# Patient Record
Sex: Female | Born: 1950
Health system: Southern US, Community
[De-identification: ages and names within clinical notes are randomized; demographics above are authoritative.]

## PROBLEM LIST (undated history)

## (undated) DIAGNOSIS — K5730 Diverticulosis of large intestine without perforation or abscess without bleeding: Secondary | ICD-10-CM

## (undated) DIAGNOSIS — K317 Polyp of stomach and duodenum: Secondary | ICD-10-CM

## (undated) DIAGNOSIS — T7840XA Allergy, unspecified, initial encounter: Secondary | ICD-10-CM

## (undated) DIAGNOSIS — K219 Gastro-esophageal reflux disease without esophagitis: Secondary | ICD-10-CM

## (undated) DIAGNOSIS — N200 Calculus of kidney: Secondary | ICD-10-CM

## (undated) DIAGNOSIS — I499 Cardiac arrhythmia, unspecified: Secondary | ICD-10-CM

## (undated) DIAGNOSIS — I4819 Other persistent atrial fibrillation: Secondary | ICD-10-CM

## (undated) DIAGNOSIS — I341 Nonrheumatic mitral (valve) prolapse: Secondary | ICD-10-CM

## (undated) DIAGNOSIS — E042 Nontoxic multinodular goiter: Secondary | ICD-10-CM

## (undated) DIAGNOSIS — K449 Diaphragmatic hernia without obstruction or gangrene: Secondary | ICD-10-CM

## (undated) DIAGNOSIS — G454 Transient global amnesia: Secondary | ICD-10-CM

## (undated) DIAGNOSIS — M1990 Unspecified osteoarthritis, unspecified site: Secondary | ICD-10-CM

## (undated) DIAGNOSIS — F32A Depression, unspecified: Secondary | ICD-10-CM

## (undated) DIAGNOSIS — I10 Essential (primary) hypertension: Secondary | ICD-10-CM

## (undated) DIAGNOSIS — I509 Heart failure, unspecified: Secondary | ICD-10-CM

## (undated) DIAGNOSIS — F419 Anxiety disorder, unspecified: Secondary | ICD-10-CM

## (undated) DIAGNOSIS — E079 Disorder of thyroid, unspecified: Secondary | ICD-10-CM

## (undated) DIAGNOSIS — J45909 Unspecified asthma, uncomplicated: Secondary | ICD-10-CM

## (undated) DIAGNOSIS — J189 Pneumonia, unspecified organism: Secondary | ICD-10-CM

## (undated) DIAGNOSIS — H269 Unspecified cataract: Secondary | ICD-10-CM

## (undated) DIAGNOSIS — A048 Other specified bacterial intestinal infections: Secondary | ICD-10-CM

## (undated) DIAGNOSIS — Z87442 Personal history of urinary calculi: Secondary | ICD-10-CM

## (undated) DIAGNOSIS — M8580 Other specified disorders of bone density and structure, unspecified site: Secondary | ICD-10-CM

## (undated) DIAGNOSIS — U071 COVID-19: Secondary | ICD-10-CM

## (undated) DIAGNOSIS — D126 Benign neoplasm of colon, unspecified: Secondary | ICD-10-CM

## (undated) HISTORY — PX: OTHER SURGICAL HISTORY: SHX169

## (undated) HISTORY — DX: Allergy, unspecified, initial encounter: T78.40XA

## (undated) HISTORY — DX: Essential (primary) hypertension: I10

## (undated) HISTORY — DX: Other specified disorders of bone density and structure, unspecified site: M85.80

## (undated) HISTORY — DX: Transient global amnesia: G45.4

## (undated) HISTORY — DX: Nonrheumatic mitral (valve) prolapse: I34.1

## (undated) HISTORY — DX: Disorder of thyroid, unspecified: E07.9

## (undated) HISTORY — DX: Other persistent atrial fibrillation: I48.19

## (undated) HISTORY — DX: Unspecified osteoarthritis, unspecified site: M19.90

## (undated) HISTORY — DX: Diaphragmatic hernia without obstruction or gangrene: K44.9

## (undated) HISTORY — DX: Calculus of kidney: N20.0

## (undated) HISTORY — PX: CHOLECYSTECTOMY: SHX55

## (undated) HISTORY — DX: Gastro-esophageal reflux disease without esophagitis: K21.9

## (undated) HISTORY — DX: Unspecified cataract: H26.9

## (undated) HISTORY — DX: Unspecified asthma, uncomplicated: J45.909

## (undated) HISTORY — DX: COVID-19: U07.1

## (undated) HISTORY — DX: Benign neoplasm of colon, unspecified: D12.6

## (undated) HISTORY — PX: EYE SURGERY: SHX253

## (undated) HISTORY — DX: Polyp of stomach and duodenum: K31.7

## (undated) HISTORY — DX: Other specified bacterial intestinal infections: A04.8

## (undated) HISTORY — PX: ABDOMINAL HYSTERECTOMY: SHX81

## (undated) HISTORY — DX: Diverticulosis of large intestine without perforation or abscess without bleeding: K57.30

---

## 1997-07-27 ENCOUNTER — Ambulatory Visit (HOSPITAL_COMMUNITY): Admission: RE | Admit: 1997-07-27 | Discharge: 1997-07-27 | Payer: Self-pay | Admitting: Internal Medicine

## 1998-11-15 ENCOUNTER — Other Ambulatory Visit: Admission: RE | Admit: 1998-11-15 | Discharge: 1998-11-15 | Payer: Self-pay | Admitting: Family Medicine

## 2001-01-07 DIAGNOSIS — D126 Benign neoplasm of colon, unspecified: Secondary | ICD-10-CM

## 2001-01-07 HISTORY — DX: Benign neoplasm of colon, unspecified: D12.6

## 2001-06-16 ENCOUNTER — Encounter: Admission: RE | Admit: 2001-06-16 | Discharge: 2001-06-16 | Payer: Self-pay | Admitting: Family Medicine

## 2001-06-16 ENCOUNTER — Encounter: Payer: Self-pay | Admitting: Family Medicine

## 2001-11-13 ENCOUNTER — Encounter: Payer: Self-pay | Admitting: Internal Medicine

## 2001-11-26 ENCOUNTER — Other Ambulatory Visit: Admission: RE | Admit: 2001-11-26 | Discharge: 2001-11-26 | Payer: Self-pay | Admitting: Family Medicine

## 2003-04-16 ENCOUNTER — Emergency Department (HOSPITAL_COMMUNITY): Admission: AD | Admit: 2003-04-16 | Discharge: 2003-04-16 | Payer: Self-pay | Admitting: Emergency Medicine

## 2003-11-16 ENCOUNTER — Ambulatory Visit: Payer: Self-pay | Admitting: Family Medicine

## 2003-11-23 ENCOUNTER — Ambulatory Visit: Payer: Self-pay | Admitting: Family Medicine

## 2003-11-24 ENCOUNTER — Other Ambulatory Visit: Admission: RE | Admit: 2003-11-24 | Discharge: 2003-11-24 | Payer: Self-pay | Admitting: Family Medicine

## 2003-11-28 ENCOUNTER — Ambulatory Visit: Payer: Self-pay | Admitting: Family Medicine

## 2003-12-12 ENCOUNTER — Ambulatory Visit: Payer: Self-pay | Admitting: Family Medicine

## 2004-01-28 ENCOUNTER — Emergency Department (HOSPITAL_COMMUNITY): Admission: EM | Admit: 2004-01-28 | Discharge: 2004-01-28 | Payer: Self-pay | Admitting: Emergency Medicine

## 2004-07-02 ENCOUNTER — Ambulatory Visit: Payer: Self-pay | Admitting: Family Medicine

## 2004-07-05 ENCOUNTER — Ambulatory Visit: Payer: Self-pay | Admitting: Cardiology

## 2004-07-13 ENCOUNTER — Encounter: Admission: RE | Admit: 2004-07-13 | Discharge: 2004-07-13 | Payer: Self-pay | Admitting: Family Medicine

## 2004-07-26 ENCOUNTER — Encounter: Admission: RE | Admit: 2004-07-26 | Discharge: 2004-07-26 | Payer: Self-pay | Admitting: Surgery

## 2004-12-14 ENCOUNTER — Ambulatory Visit: Payer: Self-pay | Admitting: Family Medicine

## 2005-01-28 ENCOUNTER — Ambulatory Visit: Payer: Self-pay | Admitting: Family Medicine

## 2005-02-12 ENCOUNTER — Encounter: Payer: Self-pay | Admitting: Internal Medicine

## 2005-02-18 ENCOUNTER — Ambulatory Visit: Payer: Self-pay | Admitting: Family Medicine

## 2005-04-02 ENCOUNTER — Encounter: Payer: Self-pay | Admitting: Family Medicine

## 2005-04-02 ENCOUNTER — Other Ambulatory Visit: Admission: RE | Admit: 2005-04-02 | Discharge: 2005-04-02 | Payer: Self-pay | Admitting: Family Medicine

## 2005-04-02 ENCOUNTER — Ambulatory Visit: Payer: Self-pay | Admitting: Family Medicine

## 2005-04-19 ENCOUNTER — Ambulatory Visit: Payer: Self-pay | Admitting: Internal Medicine

## 2005-12-04 ENCOUNTER — Encounter: Admission: RE | Admit: 2005-12-04 | Discharge: 2005-12-04 | Payer: Self-pay | Admitting: Family Medicine

## 2006-03-04 ENCOUNTER — Ambulatory Visit: Payer: Self-pay | Admitting: Family Medicine

## 2006-03-05 LAB — CONVERTED CEMR LAB
AST: 18 units/L (ref 0–37)
Albumin: 4.3 g/dL (ref 3.5–5.2)
Alkaline Phosphatase: 54 units/L (ref 39–117)
BUN: 10 mg/dL (ref 6–23)
Basophils Absolute: 0.1 10*3/uL (ref 0.0–0.1)
Basophils Relative: 1.2 % — ABNORMAL HIGH (ref 0.0–1.0)
CO2: 30 meq/L (ref 19–32)
Calcium: 10.3 mg/dL (ref 8.4–10.5)
Chloride: 105 meq/L (ref 96–112)
Cholesterol: 205 mg/dL (ref 0–200)
Creatinine, Ser: 0.7 mg/dL (ref 0.4–1.2)
Direct LDL: 108.9 mg/dL
Eosinophils Absolute: 0.2 10*3/uL (ref 0.0–0.6)
Eosinophils Relative: 2.9 % (ref 0.0–5.0)
Free T4: 0.8 ng/dL (ref 0.6–1.6)
GFR calc Af Amer: 111 mL/min
HCT: 41.1 % (ref 36.0–46.0)
HDL: 45.5 mg/dL (ref >39.0)
Lymphocytes Relative: 32.9 % (ref 12.0–46.0)
MCV: 89.9 fL (ref 78.0–100.0)
Neutro Abs: 4.4 10*3/uL (ref 1.4–7.7)
Platelets: 239 10*3/uL (ref 150–400)
T3, Free: 3.4 pg/mL (ref 2.3–4.2)
TSH: 0.35 microintl units/mL (ref 0.35–5.50)
Total Bilirubin: 1.3 mg/dL — ABNORMAL HIGH (ref 0.3–1.2)
Triglycerides: 301 mg/dL (ref 0–149)
VLDL: 60 mg/dL — ABNORMAL HIGH (ref 0–40)
WBC: 7.6 10*3/uL (ref 4.5–10.5)

## 2006-04-14 ENCOUNTER — Ambulatory Visit: Payer: Self-pay | Admitting: Family Medicine

## 2006-04-14 ENCOUNTER — Other Ambulatory Visit: Admission: RE | Admit: 2006-04-14 | Discharge: 2006-04-14 | Payer: Self-pay | Admitting: Family Medicine

## 2006-04-14 ENCOUNTER — Encounter: Payer: Self-pay | Admitting: Family Medicine

## 2006-05-21 ENCOUNTER — Ambulatory Visit: Payer: Self-pay | Admitting: Internal Medicine

## 2006-12-08 ENCOUNTER — Encounter: Admission: RE | Admit: 2006-12-08 | Discharge: 2006-12-08 | Payer: Self-pay | Admitting: Family Medicine

## 2007-01-19 ENCOUNTER — Ambulatory Visit: Payer: Self-pay | Admitting: Internal Medicine

## 2007-01-21 ENCOUNTER — Ambulatory Visit (HOSPITAL_COMMUNITY): Admission: RE | Admit: 2007-01-21 | Discharge: 2007-01-21 | Payer: Self-pay | Admitting: Internal Medicine

## 2007-01-27 ENCOUNTER — Encounter: Payer: Self-pay | Admitting: Family Medicine

## 2007-06-02 ENCOUNTER — Telehealth: Payer: Self-pay | Admitting: *Deleted

## 2007-06-18 ENCOUNTER — Telehealth: Payer: Self-pay | Admitting: *Deleted

## 2007-07-27 ENCOUNTER — Telehealth: Payer: Self-pay | Admitting: Internal Medicine

## 2007-08-07 ENCOUNTER — Telehealth: Payer: Self-pay | Admitting: Internal Medicine

## 2007-08-10 ENCOUNTER — Ambulatory Visit: Payer: Self-pay | Admitting: Internal Medicine

## 2007-08-10 ENCOUNTER — Telehealth: Payer: Self-pay | Admitting: Family Medicine

## 2007-08-10 DIAGNOSIS — I340 Nonrheumatic mitral (valve) insufficiency: Secondary | ICD-10-CM

## 2007-08-10 DIAGNOSIS — Z860100 Personal history of colon polyps, unspecified: Secondary | ICD-10-CM | POA: Insufficient documentation

## 2007-08-10 DIAGNOSIS — K5730 Diverticulosis of large intestine without perforation or abscess without bleeding: Secondary | ICD-10-CM | POA: Insufficient documentation

## 2007-08-10 DIAGNOSIS — K8020 Calculus of gallbladder without cholecystitis without obstruction: Secondary | ICD-10-CM | POA: Insufficient documentation

## 2007-08-10 DIAGNOSIS — N83209 Unspecified ovarian cyst, unspecified side: Secondary | ICD-10-CM

## 2007-08-10 DIAGNOSIS — Z87442 Personal history of urinary calculi: Secondary | ICD-10-CM

## 2007-08-10 DIAGNOSIS — K449 Diaphragmatic hernia without obstruction or gangrene: Secondary | ICD-10-CM

## 2007-08-10 DIAGNOSIS — R1013 Epigastric pain: Secondary | ICD-10-CM | POA: Insufficient documentation

## 2007-08-10 DIAGNOSIS — Z8711 Personal history of peptic ulcer disease: Secondary | ICD-10-CM

## 2007-08-10 DIAGNOSIS — M1990 Unspecified osteoarthritis, unspecified site: Secondary | ICD-10-CM

## 2007-08-10 DIAGNOSIS — K219 Gastro-esophageal reflux disease without esophagitis: Secondary | ICD-10-CM | POA: Insufficient documentation

## 2007-08-10 DIAGNOSIS — K2940 Chronic atrophic gastritis without bleeding: Secondary | ICD-10-CM | POA: Insufficient documentation

## 2007-08-10 LAB — CONVERTED CEMR LAB
Albumin: 3.9 g/dL (ref 3.5–5.2)
Alkaline Phosphatase: 50 units/L (ref 39–117)
Amylase: 79 units/L (ref 27–131)
Total Bilirubin: 1.2 mg/dL (ref 0.3–1.2)
Total Protein: 7.1 g/dL (ref 6.0–8.3)

## 2007-08-11 ENCOUNTER — Telehealth: Payer: Self-pay | Admitting: Internal Medicine

## 2007-08-12 ENCOUNTER — Ambulatory Visit (HOSPITAL_COMMUNITY): Admission: RE | Admit: 2007-08-12 | Discharge: 2007-08-12 | Payer: Self-pay | Admitting: Internal Medicine

## 2007-08-13 ENCOUNTER — Telehealth: Payer: Self-pay | Admitting: Internal Medicine

## 2007-08-13 ENCOUNTER — Encounter: Payer: Self-pay | Admitting: Internal Medicine

## 2007-08-14 ENCOUNTER — Telehealth: Payer: Self-pay | Admitting: Internal Medicine

## 2007-09-07 ENCOUNTER — Ambulatory Visit: Payer: Self-pay | Admitting: Family Medicine

## 2007-09-07 DIAGNOSIS — N750 Cyst of Bartholin's gland: Secondary | ICD-10-CM

## 2007-09-09 ENCOUNTER — Encounter: Payer: Self-pay | Admitting: Internal Medicine

## 2007-10-26 ENCOUNTER — Ambulatory Visit (HOSPITAL_COMMUNITY): Admission: RE | Admit: 2007-10-26 | Discharge: 2007-10-26 | Payer: Self-pay | Admitting: Surgery

## 2007-10-26 ENCOUNTER — Encounter (INDEPENDENT_AMBULATORY_CARE_PROVIDER_SITE_OTHER): Payer: Self-pay | Admitting: Surgery

## 2008-03-01 ENCOUNTER — Ambulatory Visit: Payer: Self-pay | Admitting: Family Medicine

## 2008-03-01 LAB — CONVERTED CEMR LAB
ALT: 12 units/L (ref 0–35)
AST: 16 units/L (ref 0–37)
Albumin: 4.2 g/dL (ref 3.5–5.2)
Alkaline Phosphatase: 62 units/L (ref 39–117)
Bilirubin Urine: NEGATIVE
Bilirubin, Direct: 0.1 mg/dL (ref 0.0–0.3)
CO2: 30 meq/L (ref 19–32)
Cholesterol: 210 mg/dL (ref 0–200)
Creatinine, Ser: 0.8 mg/dL (ref 0.4–1.2)
Eosinophils Relative: 4 % (ref 0.0–5.0)
GFR calc non Af Amer: 78 mL/min
Glucose, Bld: 95 mg/dL (ref 70–99)
Hemoglobin, Urine: NEGATIVE
Hemoglobin: 13.8 g/dL (ref 12.0–15.0)
Leukocytes, UA: NEGATIVE
Lymphocytes Relative: 31.8 % (ref 12.0–46.0)
MCHC: 34.7 g/dL (ref 30.0–36.0)
MCV: 88.9 fL (ref 78.0–100.0)
Monocytes Absolute: 0.5 10*3/uL (ref 0.1–1.0)
Monocytes Relative: 7.5 % (ref 3.0–12.0)
RDW: 12.3 % (ref 11.5–14.6)
Specific Gravity, Urine: >=1.03 (ref 1.000–1.03)
Total Bilirubin: 1.8 mg/dL — ABNORMAL HIGH (ref 0.3–1.2)
Total Protein, Urine: NEGATIVE mg/dL
Urobilinogen, UA: 0.2 (ref 0.0–1.0)
WBC: 6.3 10*3/uL (ref 4.5–10.5)
pH: 5.5 (ref 5.0–8.0)

## 2008-05-20 ENCOUNTER — Ambulatory Visit: Payer: Self-pay | Admitting: Family Medicine

## 2008-05-20 DIAGNOSIS — J309 Allergic rhinitis, unspecified: Secondary | ICD-10-CM

## 2008-05-20 DIAGNOSIS — G43009 Migraine without aura, not intractable, without status migrainosus: Secondary | ICD-10-CM | POA: Insufficient documentation

## 2008-05-31 ENCOUNTER — Ambulatory Visit (HOSPITAL_BASED_OUTPATIENT_CLINIC_OR_DEPARTMENT_OTHER): Admission: RE | Admit: 2008-05-31 | Discharge: 2008-05-31 | Payer: Self-pay | Admitting: Family Medicine

## 2008-05-31 ENCOUNTER — Ambulatory Visit: Payer: Self-pay | Admitting: Diagnostic Radiology

## 2008-10-28 ENCOUNTER — Encounter: Payer: Self-pay | Admitting: Internal Medicine

## 2009-03-06 ENCOUNTER — Telehealth: Payer: Self-pay | Admitting: Family Medicine

## 2009-03-21 ENCOUNTER — Ambulatory Visit: Payer: Self-pay | Admitting: Family Medicine

## 2009-03-21 DIAGNOSIS — R319 Hematuria, unspecified: Secondary | ICD-10-CM

## 2009-03-21 DIAGNOSIS — N390 Urinary tract infection, site not specified: Secondary | ICD-10-CM

## 2009-03-21 LAB — CONVERTED CEMR LAB
Ketones, urine, test strip: NEGATIVE
Nitrite: NEGATIVE
Protein, U semiquant: 30
Urobilinogen, UA: 0.2

## 2009-06-14 ENCOUNTER — Ambulatory Visit (HOSPITAL_BASED_OUTPATIENT_CLINIC_OR_DEPARTMENT_OTHER): Admission: RE | Admit: 2009-06-14 | Discharge: 2009-06-14 | Payer: Self-pay | Admitting: Family Medicine

## 2009-06-14 ENCOUNTER — Ambulatory Visit: Payer: Self-pay | Admitting: Diagnostic Radiology

## 2009-07-12 ENCOUNTER — Telehealth: Payer: Self-pay | Admitting: Family Medicine

## 2009-08-14 ENCOUNTER — Telehealth: Payer: Self-pay | Admitting: Family Medicine

## 2009-08-22 ENCOUNTER — Telehealth: Payer: Self-pay | Admitting: Family Medicine

## 2009-09-28 ENCOUNTER — Telehealth: Payer: Self-pay | Admitting: Family Medicine

## 2009-10-16 ENCOUNTER — Ambulatory Visit: Payer: Self-pay | Admitting: Family Medicine

## 2009-10-16 LAB — CONVERTED CEMR LAB
ALT: 14 units/L (ref 0–35)
AST: 18 units/L (ref 0–37)
Albumin: 4.2 g/dL (ref 3.5–5.2)
Basophils Relative: 0.8 % (ref 0.0–3.0)
Calcium: 10.1 mg/dL (ref 8.4–10.5)
Chloride: 105 meq/L (ref 96–112)
Creatinine, Ser: 0.8 mg/dL (ref 0.4–1.2)
Direct LDL: 106.8 mg/dL
Eosinophils Relative: 3.6 % (ref 0.0–5.0)
GFR calc non Af Amer: 78.72 mL/min (ref >60)
Ketones, ur: NEGATIVE mg/dL
Lymphocytes Relative: 38.1 % (ref 12.0–46.0)
Lymphs Abs: 2 10*3/uL (ref 0.7–4.0)
MCV: 90.6 fL (ref 78.0–100.0)
Monocytes Absolute: 0.4 10*3/uL (ref 0.1–1.0)
Neutrophils Relative %: 50.2 % (ref 43.0–77.0)
RBC: 4.44 M/uL (ref 3.87–5.11)
TSH: 0.44 microintl units/mL (ref 0.35–5.50)
Total Bilirubin: 1.5 mg/dL — ABNORMAL HIGH (ref 0.3–1.2)
Total Protein, Urine: NEGATIVE mg/dL
Total Protein: 7.5 g/dL (ref 6.0–8.3)
Triglycerides: 272 mg/dL — ABNORMAL HIGH (ref 0.0–149.0)
pH: 5 (ref 5.0–8.0)

## 2009-10-27 ENCOUNTER — Ambulatory Visit: Payer: Self-pay | Admitting: Family Medicine

## 2009-10-27 ENCOUNTER — Other Ambulatory Visit: Admission: RE | Admit: 2009-10-27 | Discharge: 2009-10-27 | Payer: Self-pay | Admitting: Family Medicine

## 2009-10-27 ENCOUNTER — Encounter: Payer: Self-pay | Admitting: Family Medicine

## 2009-11-10 ENCOUNTER — Ambulatory Visit: Payer: Self-pay | Admitting: Family Medicine

## 2009-11-13 ENCOUNTER — Telehealth: Payer: Self-pay | Admitting: Family Medicine

## 2009-11-14 DIAGNOSIS — D235 Other benign neoplasm of skin of trunk: Secondary | ICD-10-CM | POA: Insufficient documentation

## 2009-12-26 ENCOUNTER — Ambulatory Visit: Payer: Self-pay | Admitting: Family Medicine

## 2010-01-28 ENCOUNTER — Encounter: Payer: Self-pay | Admitting: Family Medicine

## 2010-02-02 ENCOUNTER — Telehealth: Payer: Self-pay | Admitting: Family Medicine

## 2010-02-06 NOTE — Progress Notes (Signed)
Summary: rx clarification  Phone Note From Pharmacy   Summary of Call: rx clarification Initial call taken by: Kern Reap CMA Duncan Dull),  November 13, 2009 5:22 PM    New/Updated Medications: PREMARIN 0.625 MG/GM CREA (ESTROGENS, CONJUGATED) 0.5g twice a week vaginally Prescriptions: PREMARIN 0.625 MG/GM CREA (ESTROGENS, CONJUGATED) 0.5g twice a week vaginally  #2 x 3   Entered by:   Kern Reap CMA (AAMA)   Authorized by:   Roderick Pee MD   Signed by:   Kern Reap CMA (AAMA) on 11/13/2009   Method used:   Electronically to        [facility] [phone]* (retail)       [address]       [place], [place]  [address]       Ph: [phone]       Fax: [phone]   RxID:   [id number]

## 2010-02-06 NOTE — Progress Notes (Signed)
Summary: hctz  Phone Note Refill Request Message from:  Fax from Pharmacy on August 14, 2009 5:09 PM  Refills Requested: Medication #1:  CORGARD 20 MG  TABS 1 tablet po once daily Initial call taken by: Kern Reap CMA Duncan Dull),  August 14, 2009 5:09 PM    Prescriptions: CORGARD 20 MG  TABS (NADOLOL) 1 tablet po once daily  #100 Tablet x 2   Entered by:   Kern Reap CMA (AAMA)   Authorized by:   Roderick Pee MD   Signed by:   Kern Reap CMA (AAMA) on 08/14/2009   Method used:   Electronically to        [facility] [phone]* (retail)       [address]       [place], [place]  [address]       Ph: [phone]       Fax: [phone]   RxID:   [id number]

## 2010-02-06 NOTE — Progress Notes (Signed)
Summary: cipro refill  Phone Note From Pharmacy   Summary of Call: patient is requesting a rx for cipro is this okay to fill? Initial call taken by: Kern Reap CMA Duncan Dull),  August 22, 2009 4:49 PM  Follow-up for Phone Call        Cipro 250 mg, dispense 14 tablets directions one p.o. b.i.d. to a bottle empty.  No refills Follow-up by: Roderick Pee MD,  August 22, 2009 4:58 PM    Prescriptions: CIPRO 500 MG TABS (CIPROFLOXACIN HCL) Take 1 tablet by mouth two times a day  #14 x 0   Entered by:   Kern Reap CMA (AAMA)   Authorized by:   Roderick Pee MD   Signed by:   Kern Reap CMA (AAMA) on 08/22/2009   Method used:   Electronically to        [facility] [phone]* (retail)       [address]       [place], [place]  [address]       Ph: [phone]       Fax: [phone]   RxID:   [id number]

## 2010-02-06 NOTE — Progress Notes (Signed)
Summary: vicodin refill  Phone Note Refill Request Message from:  Fax from Pharmacy on March 06, 2009 1:51 PM  Refills Requested: Medication #1:  VICODIN 5-500 MG  TABS Take 1 tablet every 6 hours as needed Initial call taken by: Kern Reap CMA Duncan Dull),  March 06, 2009 1:51 PM    Prescriptions: VICODIN 5-500 MG  TABS (HYDROCODONE-ACETAMINOPHEN) Take 1 tablet every 6 hours as needed  #30 x 1   Entered by:   Kern Reap CMA (AAMA)   Authorized by:   Roderick Pee MD   Signed by:   Kern Reap CMA (AAMA) on 03/06/2009   Method used:   Telephoned to ...       [facility] [phone]* (retail)       [address]       [place], [place]  [address]       Ph: [phone]       Fax: [phone]   RxID:   [id number]

## 2010-02-06 NOTE — Assessment & Plan Note (Signed)
Summary: LESION REMOVAL // RS   Procedure Note Last Tetanus: Historical (01/08/2007)  Mole Biopsy/Removal: Indication: rule out cancer Consent signed: yes  Procedure # 1: elliptical incision with 2 mm margin    Size (in cm): 0.8 x 0.8    Region: anterior    Location: left bst    Instrument used: #15 blade    Anesthesia: 1% lidocaine w/epinephrine    Closure: cautery  Cleaned and prepped with: alcohol Wound dressing: neosporin and bandaid   Primary Care Provider:  Kelle Darting, M.D.   History of Present Illness: Anna Marquez is a 60-year-old female, who comes in today for removal of a black lesion on the margin of her left breast at the clock  Allergies: 1)  ! Pcn 2)  ! Erythromycin   Complete Medication List: 1)  Hydrochlorothiazide 25 Mg Tabs (Hydrochlorothiazide) .[name].. 1 tablet by mouth once daily 2)  Nexium 40 Mg Cpdr (Esomeprazole magnesium) .... Take 1 tablet by mouth once daily 3)  Beconase Aq 42 Mcg/spray Susp (Beclomethasone diprop monohyd) .... Use as directed 4)  Vicodin 5-500 Mg Tabs (Hydrocodone-acetaminophen) .... Take 1 tablet every 6 hours as needed 5)  Imodium A-d 2 Mg Tabs (Loperamide hcl) .... Take one tab once daily as needed 6)  Imitrex 5 Mg/act Soln (Sumatriptan) .... As needed for migraine 7)  Fish Oil Oil (Fish oil) .... Once daily 8)  Vitamin D 2000 Unit Tabs (Cholecalciferol) .... Once daily 9)  Vitamin E 1000 Unit Caps (Vitamin e) .... Once daily 10)  Corgard 40 Mg Tabs (Nadolol) .... Take 1 tablet by mouth every morning 11)  Premarin 0.625 Mg/gm Crea (Estrogens, conjugated) .... Uad  Other Orders: Shave Skin Lesion 0.6-1.0 cm/trunk/arm/leg (11301)   Orders Added: 1)  Shave Skin Lesion 0.6-1.0 cm/trunk/arm/leg [11301]

## 2010-02-06 NOTE — Progress Notes (Signed)
Summary: refill  Phone Note Refill Request Message from:  Fax from Pharmacy on September 28, 2009 3:43 PM  Refills Requested: Medication #1:  BECONASE AQ 42 MCG/SPRAY  SUSP use as directed Initial call taken by: Kern Reap CMA Duncan Dull),  September 28, 2009 3:43 PM    Prescriptions: BECONASE AQ 42 MCG/SPRAY  SUSP (BECLOMETHASONE DIPROP MONOHYD) use as directed  #25 Gram x 4   Entered by:   Kern Reap CMA (AAMA)   Authorized by:   Roderick Pee MD   Signed by:   Kern Reap CMA (AAMA) on 09/28/2009   Method used:   Electronically to        [facility] [phone]* (retail)       [address]       [place], [place]  [address]       Ph: [phone]       Fax: [phone]   RxID:   [id number]

## 2010-02-06 NOTE — Miscellaneous (Signed)
Summary: Consent to Special Procedure  Consent to Special Procedure   Imported By: Lanelle Bal 11/17/2009 08:51:01  _____________________________________________________________________  External Attachment:    Type:   Image     Comment:   External Document

## 2010-02-06 NOTE — Assessment & Plan Note (Signed)
Summary: UTI/dm   Vital Signs:  Patient profile:   60 year old female Menstrual status:  postmenopausal Weight:      160 pounds Temp:     98.8 degrees F oral BP sitting:   124 / 84  (left arm) Cuff size:   regular  Vitals Entered By: Kern Reap CMA Duncan Dull) (March 21, 2009 2:29 PM)  Reason for Visit uti  Primary Care Provider:  Kelle Darting, M.D.   History of Present Illness: Anna Marquez is a 50-year-old female, single, nonsmoker, who comes in with a 4-day history of urinary frequency and dysuria.  No fever, chills,   Allergies: 1)  ! Pcn 2)  ! Erythromycin  Past History:  Past medical, surgical, family and social histories (including risk factors) reviewed for relevance to current acute and chronic problems.  Past Medical History: Reviewed history from 08/10/2007 and no changes required. Current Problems:  ABDOMINAL PAIN, EPIGASTRIC (ICD-789.06) DIVERTICULOSIS, COLON (ICD-562.10) Hx of OVARIAN CYST (ICD-620.2) GERD (ICD-530.81) NEPHROLITHIASIS, HX OF (ICD-V13.01) OSTEOARTHRITIS (ICD-715.90) MITRAL VALVE PROLAPSE (ICD-424.0) COLONIC POLYPS, ADENOMATOUS, HX OF (ICD-V12.72) GASTRITIS, CHRONIC (ICD-535.10) HIATAL HERNIA (ICD-553.3) CHOLELITHIASIS (ICD-574.20) HELICOBACTER PYLORI INFECTION, HX OF (ICD-V12.71)  Past Surgical History: Reviewed history from 10/28/2008 and no changes required. Kidney stone removal x 2 Cholecystectomy  Family History: Reviewed history from 08/10/2007 and no changes required. Family History of Breast Cancer: Paternal Aunt No FH of Colon Cancer Family History of Prostate Cancer: Father Family History of Diabetes: Maternal Grandmother Family History of Heart Disease: Maternal Grandmother, Maternal Grandfather  Social History: Reviewed history from 08/10/2007 and no changes required. Alcohol Use - yes Illicit Drug Use - no Patient has never smoked.  Daily Caffeine Use  Review of Systems      See HPI  Physical Exam  General:   Well-developed,well-nourished,in no acute distress; alert,appropriate and cooperative throughout examination Abdomen:  Bowel sounds positive,abdomen soft and non-tender without masses, organomegaly or hernias noted.   Problems:  Medical Problems Added: 1)  Dx of Uti  (ICD-599.0) 2)  Dx of Hematuria Unspecified  (ICD-599.70)  Impression & Recommendations:  Problem # 1:  UTI (ICD-599.0) Assessment New  Her updated medication list for this problem includes:    Cipro 500 Mg Tabs (Ciprofloxacin hcl) .[name]... Take 1 tablet by mouth two times a day  Complete Medication List: 1)  Carafate 1 Gm/1ml Susp (Sucralfate) .[name].. 1 teaspoon by mouth as needed 2)  Hydrochlorothiazide 25 Mg Tabs (Hydrochlorothiazide) .[name].. 1 tablet by mouth once daily 3)  Corgard 20 Mg Tabs (Nadolol) .[name].. 1 tablet po once daily 4)  Nexium 40 Mg Cpdr (Esomeprazole magnesium) .... Take 1 tablet by mouth once daily 5)  Hyoscyamine Sulfate Cr 0.375 Mg Xr12h-cap (Hyoscyamine sulfate) .[name].. 1 capsule by mouth every morning and 1 capsule every night 6)  Beconase Aq 42 Mcg/spray Susp (Beclomethasone diprop monohyd) .... Use as directed 7)  Vicodin 5-500 Mg Tabs (Hydrocodone-acetaminophen) .... Take 1 tablet every 6 hours as needed 8)  Imodium A-d 2 Mg Tabs (Loperamide hcl) .... Take one tab once daily as needed 9)  Imitrex 5 Mg/act Soln (Sumatriptan) .... As needed for migraine 10)  Fish Oil Oil (Fish oil) .... Once daily 11)  Vitamin D 2000 Unit Tabs (Cholecalciferol) .... Once daily 12)  Vitamin E 1000 Unit Caps (Vitamin e) .... Once daily 13)  Cipro 500 Mg Tabs (Ciprofloxacin hcl) .... Take 1 tablet by mouth two times a day  Other Orders: UA Dipstick w/o Micro (manual) (19147)  Patient Instructions: 1)  begin Cipro, one twice a day, x 1 week.  Return p.r.n.[name] 2)  You may also get over-the-counter Pyridium 3 times a day  p.r.n.  +.....                                       Prescriptions: CIPRO 500 MG TABS (CIPROFLOXACIN HCL) Take 1 tablet by mouth two times a day  #14 x 1   Entered and Authorized by:   Roderick Pee MD   Signed by:   Roderick Pee MD on 03/21/2009   Method used:   Electronically to        [facility] [phone]* (retail)       [address]       [place], [place]  [address]       Ph: [phone]       Fax: [phone]   RxID:   [id number]   Laboratory Results   Urine Tests  Date/Time Received: March 21, 2009  Date/Time Reported: 2:46 PM   Routine Urinalysis   Color: yellow Appearance: Cloudy Glucose: negative   (Normal Range: Negative) Bilirubin: negative   (Normal Range: Negative) Ketone: negative   (Normal Range: Negative) Spec. Gravity: 1.010   (Normal Range: 1.003-1.035) Blood: large   (Normal Range: Negative) pH: 5.0   (Normal Range: 5.0-8.0) Protein: 30   (Normal Range: Negative) Urobilinogen: 0.2   (Normal Range: 0-1) Nitrite: negative   (Normal Range: Negative) Leukocyte Esterace: moderate   (Normal Range: Negative)    Comments: Kern Reap CMA Duncan Dull)  March 21, 2009 2:46 PM

## 2010-02-06 NOTE — Progress Notes (Signed)
Summary: hctz refill  Phone Note Refill Request Message from:  Fax from Pharmacy on July 12, 2009 12:24 PM  Refills Requested: Medication #1:  HYDROCHLOROTHIAZIDE 25 MG TABS 1 tablet by mouth once daily Initial call taken by: Kern Reap CMA Duncan Dull),  July 12, 2009 12:24 PM    Prescriptions: HYDROCHLOROTHIAZIDE 25 MG TABS (HYDROCHLOROTHIAZIDE) 1 tablet by mouth once daily  #100 Tablet x 3   Entered by:   Kern Reap CMA (AAMA)   Authorized by:   Roderick Pee MD   Signed by:   Kern Reap CMA (AAMA) on 07/12/2009   Method used:   Electronically to        [facility] [phone]* (retail)       [address]       [place], [place]  [address]       Ph: [phone]       Fax: [phone]   RxID:   [id number]

## 2010-02-06 NOTE — Assessment & Plan Note (Signed)
Summary: cpx---pap//ccm   Vital Signs:  Patient profile:   60 year old female Menstrual status:  postmenopausal Height:      64.5 inches Weight:      170 pounds BMI:     28.83 Temp:     98.5 degrees F oral BP sitting:   130 / 90  (left arm) Cuff size:   regular  Vitals Entered By: Kern Reap CMA Duncan Dull) (October 27, 2009 2:29 PM) CC: cpx Is Patient Diabetic? No Pain Assessment Patient in pain? no        Primary Care Provider:  Kelle Darting, M.D.  CC:  cpx.  History of Present Illness: Anna Marquez is a 60 year-old single female, nonsmoker, who comes in today for physical evaluation because of underlying migraine headaches, allergic rhinitis, reflux esophagitis, history of MVP osteoarthritis.  Her migraine headaches went away and now the back.  If she takes the Imitrex immediately then they typically will stop.  She occasionally has to take a half of Vicodin.  She now is having two to 3 migraines per month.  We reviewed the triggers.  Stress is normal.  No caffeine.  No sleep dysfunction.she takes Corgard 20 mg daily.  We discussed other options.  She likes to increase the Corgard first.  She uses steroid nasal spray p.r.n. for allergic rhinitis.  She has chronic reflux esophagitis.  She is on Nexium 40 mg daily, however, she is now using an OTC product.  She states she went to see Dr. Dickie La, but waited for 3 hours and then left.  There will not renew her medication.  She's tried over-the-counter medications that did not help.  She gets routine eye care, dental care, BSE monthly, annual mammography, she thinks she maybe due for colonoscopy by Dr. Dickie La.  The last one she had was 5 years ago, and she had some polyps.  Tetanus 2009, seasonal flu shot 2011  She takes  thiazide 25 mg q.a.m. for venous insufficiency  Allergies: 1)  ! Pcn 2)  ! Erythromycin  Past History:  Past medical, surgical, family and social histories (including risk factors) reviewed, and no changes noted  (except as noted below).  Past Medical History: Reviewed history from 08/10/2007 and no changes required. Current Problems:  ABDOMINAL PAIN, EPIGASTRIC (ICD-789.06) DIVERTICULOSIS, COLON (ICD-562.10) Hx of OVARIAN CYST (ICD-620.2) GERD (ICD-530.81) NEPHROLITHIASIS, HX OF (ICD-V13.01) OSTEOARTHRITIS (ICD-715.90) MITRAL VALVE PROLAPSE (ICD-424.0) COLONIC POLYPS, ADENOMATOUS, HX OF (ICD-V12.72) GASTRITIS, CHRONIC (ICD-535.10) HIATAL HERNIA (ICD-553.3) CHOLELITHIASIS (ICD-574.20) HELICOBACTER PYLORI INFECTION, HX OF (ICD-V12.71)  Past Surgical History: Reviewed history from 10/28/2008 and no changes required. Kidney stone removal x 2 Cholecystectomy  Family History: Reviewed history from 08/10/2007 and no changes required. Family History of Breast Cancer: Paternal Aunt No FH of Colon Cancer Family History of Prostate Cancer: Father Family History of Diabetes: Maternal Grandmother Family History of Heart Disease: Maternal Grandmother, Maternal Grandfather  Social History: Reviewed history from 08/10/2007 and no changes required. Alcohol Use - yes Illicit Drug Use - no Patient has never smoked.  Daily Caffeine Use  Review of Systems      See HPI  Physical Exam  General:  Well-developed,well-nourished,in no acute distress; alert,appropriate and cooperative throughout examination Head:  Normocephalic and atraumatic without obvious abnormalities. No apparent alopecia or balding. Eyes:  No corneal or conjunctival inflammation noted. EOMI. Perrla. Funduscopic exam benign, without hemorrhages, exudates or papilledema. Vision grossly normal. Ears:  External ear exam shows no significant lesions or deformities.  Otoscopic examination reveals clear canals, tympanic membranes  are intact bilaterally without bulging, retraction, inflammation or discharge. Hearing is grossly normal bilaterally. Nose:  External nasal examination shows no deformity or inflammation. Nasal mucosa are pink and  moist without lesions or exudates. Mouth:  Oral mucosa and oropharynx without lesions or exudates.  Teeth in good repair. Neck:  No deformities, masses, or tenderness noted. Chest Wall:  No deformities, masses, or tenderness noted. Breasts:  No mass, nodules, thickening, tenderness, bulging, retraction, inflamation, nipple discharge or skin changes noted.   Lungs:  Normal respiratory effort, chest expands symmetrically. Lungs are clear to auscultation, no crackles or wheezes. Heart:  Normal rate and regular rhythm. S1 and S2 normal without gallop, murmur, click, rub or other extra sounds. Abdomen:  Bowel sounds positive,abdomen soft and non-tender without masses, organomegaly or hernias noted. Rectal:  No external abnormalities noted. Normal sphincter tone. No rectal masses or tenderness. Genitalia:  Pelvic Exam:        External: normal female genitalia without lesions or masses        Vagina: normal without lesions or masses        Cervix: normal without lesions or masses        Adnexa: normal bimanual exam without masses or fullness        Uterus: normal by palpation        Pap smear: performed Msk:  No deformity or scoliosis noted of thoracic or lumbar spine.   Pulses:  R and L carotid,radial,femoral,dorsalis pedis and posterior tibial pulses are full and equal bilaterally Extremities:  No clubbing, cyanosis, edema, or deformity noted with normal full range of motion of all joints.   Neurologic:  No cranial nerve deficits noted. Station and gait are normal. Plantar reflexes are down-going bilaterally. DTRs are symmetrical throughout. Sensory, motor and coordinative functions appear intact. Skin:  total body skin exam normal except for black lesion margin of her left breast to return for removal Cervical Nodes:  No lymphadenopathy noted Axillary Nodes:  No palpable lymphadenopathy Inguinal Nodes:  No significant adenopathy Psych:  Cognition and judgment appear intact. Alert and cooperative  with normal attention span and concentration. No apparent delusions, illusions, hallucinations   Impression & Recommendations:  Problem # 1:  ALLERGIC RHINITIS (ICD-477.9) Assessment Improved  Her updated medication list for this problem includes:    Beconase Aq 42 Mcg/spray Susp (Beclomethasone diprop monohyd) ..... Use as directed  Orders: Prescription Created Electronically [phone])  Problem # 2:  COMMON MIGRAINE (ICD-346.10) Assessment: Deteriorated  The following medications were removed from the medication list:    Corgard 20 Mg Tabs (Nadolol) .[name]... 1 tablet po once daily Her updated medication list for this problem includes:    Vicodin 5-500 Mg Tabs (Hydrocodone-acetaminophen) .[name]... Take 1 tablet every 6 hours as needed    Imitrex 5 Mg/act Soln (Sumatriptan) .[name]... As needed for migraine    Corgard 40 Mg Tabs (Nadolol) .[name]... Take 1 tablet by mouth every morning  Orders: Prescription Created Electronically [phone])  Problem # 3:  GERD (ICD-530.81) Assessment: Deteriorated  The following medications were removed from the medication list:    Carafate 1 Gm/11ml Susp (Sucralfate) .[name]... 1 teaspoon by mouth as needed    Hyoscyamine Sulfate Cr 0.375 Mg Xr12h-cap (Hyoscyamine sulfate) .[name]... 1 capsule by mouth every morning and 1 capsule every night Her updated medication list for this problem includes:    Nexium 40 Mg Cpdr (Esomeprazole magnesium) .[name]... Take 1 tablet by mouth once daily  Orders: Prescription Created Electronically [phone])  Problem # 4:  MITRAL VALVE PROLAPSE (ICD-424.0) Assessment: Unchanged  The following medications were removed from the medication list:    Corgard 20 Mg Tabs (Nadolol) .[name]... 1 tablet po once daily Her updated medication list for this problem includes:    Corgard 40 Mg Tabs (Nadolol) .[name]... Take 1 tablet by mouth every morning  Orders: Prescription Created Electronically [id number])  Problem # 5:  Preventive Health Care  (ICD-V70.0) Assessment: Unchanged  Complete Medication List: 1)  Hydrochlorothiazide 25 Mg Tabs (Hydrochlorothiazide) .[name].. 1 tablet by mouth once daily 2)  Nexium 40 Mg Cpdr (Esomeprazole magnesium) .... Take 1 tablet by mouth once daily 3)  Beconase Aq 42 Mcg/spray Susp (Beclomethasone diprop monohyd) .... Use as directed 4)  Vicodin 5-500 Mg Tabs (Hydrocodone-acetaminophen) .... Take 1 tablet every 6 hours as needed 5)  Imodium A-d 2 Mg Tabs (Loperamide hcl) .... Take one tab once daily as needed 6)  Imitrex 5 Mg/act Soln (Sumatriptan) .... As needed for migraine 7)  Fish Oil Oil (Fish oil) .... Once daily 8)  Vitamin D 2000 Unit Tabs (Cholecalciferol) .... Once daily 9)  Vitamin E 1000 Unit Caps (Vitamin e) .... Once daily 10)  Corgard 40 Mg Tabs (Nadolol) .... Take 1 tablet by mouth every morning 11)  Premarin 0.625 Mg/gm Crea (Estrogens, conjugated) .... Uad  Other Orders: EKG w/ Interpretation (93000)  Patient Instructions: 1)  increase the Corgard to 40 mg daily.  Take the Imitrex immediately if you have a migraine headache.  A half of a Vicodin p.r.n. for breakthrough migraines.  Keep her migraine diary. 2)  Return next week to remove the black lesion on the left breast. 3)  Please schedule a follow-up appointment in 1 year. 4)  It is important that you exercise regularly at least 20 minutes 5 times a week. If you develop chest pain, have severe difficulty breathing, or feel very tired , stop exercising immediately and seek medical attention. 5)  Schedule your mammogram. 6)  Schedule a colonoscopy/sigmoidoscopy to help detect colon cancer. 7)  Take calcium +Vitamin D daily. 8)  Take an Aspirin every day. Prescriptions: PREMARIN 0.625 MG/GM CREA (ESTROGENS, CONJUGATED) UAD  #2 tubes x 4   Entered and Authorized by:   Roderick Pee MD   Signed by:   Roderick Pee MD on 10/27/2009   Method used:   Print then Give to Patient   RxID:   [id number] VICODIN 5-500 MG  TABS  (HYDROCODONE-ACETAMINOPHEN) Take 1 tablet every 6 hours as needed  #30 x 1   Entered and Authorized by:   Roderick Pee MD   Signed by:   Roderick Pee MD on 10/27/2009   Method used:   Print then Give to Patient   RxID:   [id number] HWEXHBZ 5 MG/ACT SOLN (SUMATRIPTAN) as needed for migraine  #6 x 11   Entered and Authorized by:   Roderick Pee MD   Signed by:   Roderick Pee MD on 10/27/2009   Method used:   Electronically to        [facility] [phone]* (retail)       [address]       [place], [place]  [address]       Ph: [phone]       Fax: [phone]   RxID:   [id number] BECONASE AQ 42 MCG/SPRAY  SUSP (BECLOMETHASONE DIPROP MONOHYD) use as directed  #2 units x 4   Entered  and Authorized by:   Roderick Pee MD   Signed by:   Roderick Pee MD on 10/27/2009   Method used:   Electronically to        [facility] [phone]* (retail)       [address]       [place], [place]  [address]       Ph: [phone]       Fax: [phone]   RxID:   [id number] NEXIUM 40 MG  CPDR (ESOMEPRAZOLE MAGNESIUM) Take 1 tablet by mouth once daily  #100 x 3   Entered and Authorized by:   Roderick Pee MD   Signed by:   Roderick Pee MD on 10/27/2009   Method used:   Electronically to        [facility] [phone]* (retail)       [address]       [place], [place]  [address]       Ph: [phone]       Fax: [phone]   RxID:   [id number] CORGARD 40 MG TABS (NADOLOL) Take 1 tablet by mouth every morning  #100 x 3   Entered and Authorized by:   Roderick Pee MD   Signed by:   Roderick Pee MD on 10/27/2009   Method used:   Electronically to        [facility] [phone]* (retail)       [address]       [place], [place]  [address]       Ph: [phone]       Fax: [phone]   RxID:   [id number] HYDROCHLOROTHIAZIDE 25 MG TABS  (HYDROCHLOROTHIAZIDE) 1 tablet by mouth once daily  #100 Tablet x 3   Entered and Authorized by:   Roderick Pee MD   Signed by:   Roderick Pee MD on 10/27/2009   Method used:   Electronically to        [facility] [phone]* (retail)       [address]       [place], [place]  [address]       Ph: [phone]       Fax: [phone]   RxID:   [id number]    Orders Added: 1)  Prescription Created Electronically [G8553] 2)  Est. Patient 40-64 years [423[address]] 3)  EKG w/ Interpretation [93000]   Immunization History:  Tetanus/Td Immunization History:    Tetanus/Td:  historical (01/08/2007)  Influenza Immunization History:    Influenza:  historical (10/07/2009)   Immunization History:  Tetanus/Td Immunization History:    Tetanus/Td:  Historical (01/08/2007)  Influenza Immunization History:    Influenza:  Historical (10/07/2009)

## 2010-02-08 NOTE — Progress Notes (Addendum)
Summary: rx for nerves  Phone Note Call from Patient Call back at Home Phone [phone]   Caller: Patient Call For: todd Summary of Call: pt would like a rx for her nerves, she is not sleeping and she has family issues.  Dr Tawanna Cooler has given her diazepam in the past and it has worked.  Call into [facility] Initial call taken by: Alfred Levins, CMA,  February 02, 2010 4:51 PM  Follow-up for Phone Call        I called in alium  5 mg, number 30 directions one nightly p.r.n. no refills Follow-up by: Roderick Pee MD,  February 02, 2010 6:32 PM     Appended Document: rx for nerves Medications Added ATIVAN 0.5 MG TABS (LORAZEPAM) 1 tab at bedtime      left message on machine for patient     Clinical Lists Changes  Medications: Added new medication of ATIVAN 0.5 MG TABS (LORAZEPAM) 1 tab at bedtime - Signed Rx of ATIVAN 0.5 MG TABS (LORAZEPAM) 1 tab at bedtime;  #30 x 0;  Signed;  Entered by: Kern Reap CMA (AAMA);  Authorized by: Roderick Pee MD;  Method used: Telephoned to [facility] #[id number]*, [address] [place], [place], [place]  [address], Ph: [phone], Fax: [phone]    Prescriptions: ATIVAN 0.5 MG TABS (LORAZEPAM) 1 tab at bedtime  #30 x 0   Entered by:   Kern Reap CMA (AAMA)   Authorized by:   Roderick Pee MD   Signed by:   Kern Reap CMA (AAMA) on 02/05/2010   Method used:   Telephoned to ...       [facility]  [address] [phone]* (retail)       [address]       [place], [place]  [address]       Ph: [phone]       Fax: [phone]   RxID:   [id number]

## 2010-02-15 ENCOUNTER — Telehealth: Payer: Self-pay | Admitting: Internal Medicine

## 2010-02-21 ENCOUNTER — Encounter: Payer: Self-pay | Admitting: Internal Medicine

## 2010-02-21 ENCOUNTER — Ambulatory Visit (INDEPENDENT_AMBULATORY_CARE_PROVIDER_SITE_OTHER): Payer: Managed Care, Other (non HMO) | Admitting: Internal Medicine

## 2010-02-21 DIAGNOSIS — R1013 Epigastric pain: Secondary | ICD-10-CM

## 2010-02-22 NOTE — Progress Notes (Signed)
Summary: Triage   Phone Note Call from Patient Call back at Work Phone [phone]   Caller: Patient Call For: Dr. Juanda Chance Reason for Call: Talk to Nurse Summary of Call: Acid reflux, abd pain, bloating...wants to discuss Initial call taken by: Karna Christmas,  February 15, 2010 9:43 AM  Follow-up for Phone Call        Message left for patient to call back. Follow-up by: Jesse Fall RN,  February 15, 2010 11:36 AM  Additional Follow-up for Phone Call Additional follow up Details #1::        Called patient. She states she sent for her annual exam with PCP and he wanted her to call. She is having heartburn, acid reflux and bloating for a while now. She is taking Nexium daily. Also, she started having diarrhea off and on a couple weeks ago after being around her boss that had a stomach virus. She is using Imodium and this is helping her. Patient will increase her Nexium to two times a day. Scheduled patient to see Dr. Juanda Chance on 02/21/10 at 2:30 PM. Last colon: 11/13/2001-Adenomatous polyp. Please, advise. Additional Follow-up by: Jesse Fall RN,  February 15, 2010 4:00 PM    Additional Follow-up for Phone Call Additional follow up Details #2::    nothing more to add. I will see her on 02/21/2010 Follow-up by: Hart Carwin MD,  February 15, 2010 9:49 PM

## 2010-02-28 NOTE — Assessment & Plan Note (Addendum)
Summary: acid reflux , bloating and and heartburn    History of Present Illness Visit Type: Follow-up Visit Primary GI MD: Lina Sar MD Primary Provider: Kelle Darting, M.D. Chief Complaint: acid reflux, bloating and heartburn x 3 months   is better since increasing her Nexium to bid  History of Present Illness:   This is a 60 year old, white female with epigastric pain and a history of gastroesophageal reflux and bloating. We have increased her Nexium to 40 mg twice a day which resulted in no significant improvement of her symptoms. She still has burning and epigastric discomfort, usually postprandially. She has a history of symptomatic cholelithiasis and is status post laparoscopic cholecystectomy in September 2009. Her last upper endoscopy at [facility] in 2007 showed H. pylori which was treated. She also had a hiatal hernia. Her last colonoscopy in 1999 and 2003 showed adenomatous polyps.   GI Review of Systems    Reports abdominal pain, acid reflux, bloating, and  heartburn.     Location of  Abdominal pain: upper abdomen.    Denies belching, chest pain, dysphagia with liquids, dysphagia with solids, loss of appetite, nausea, vomiting, vomiting blood, weight loss, and  weight gain.        Denies anal fissure, black tarry stools, change in bowel habit, constipation, diarrhea, diverticulosis, fecal incontinence, heme positive stool, hemorrhoids, irritable bowel syndrome, jaundice, light color stool, liver problems, rectal bleeding, and  rectal pain.    Current Medications (verified): 1)  Hydrochlorothiazide 25 Mg Tabs (Hydrochlorothiazide) .[name].. 1 Tablet By Mouth Once Daily 2)  Nexium 40 Mg  Cpdr (Esomeprazole Magnesium) .... Take 1 Tablet By Mouth Two Times A Day 3)  Beconase Aq 42 Mcg/spray  Susp (Beclomethasone Diprop Monohyd) .... Use As Directed  As Needed 4)  Vicodin 5-500 Mg  Tabs (Hydrocodone-Acetaminophen) .... Take 1 Tablet Every 6 Hours As Needed 5)  Imodium A-D 2 Mg Tabs  (Loperamide Hcl) .... Take One Tab Once Daily As Needed 6)  Imitrex 5 Mg/act Soln (Sumatriptan) .... As Needed For Migraine 7)  Fish Oil  Oil (Fish Oil) .... Once Daily 8)  Vitamin D 2000 Unit Tabs (Cholecalciferol) .... Once Daily 9)  Vitamin E 1000 Unit Caps (Vitamin E) .... Once Daily 10)  Corgard 40 Mg Tabs (Nadolol) .... Take 1 Tablet By Mouth Every Morning 11)  Premarin 0.625 Mg/gm Crea (Estrogens, Conjugated) .... 0.5g Twice A Week Vaginally 12)  Ativan 0.5 Mg Tabs (Lorazepam) .[name].. 1 Tab At Bedtime  Allergies (verified): 1)  ! Pcn 2)  ! Erythromycin  Past History:  Past Medical History: Reviewed history from 08/10/2007 and no changes required. Current Problems:  ABDOMINAL PAIN, EPIGASTRIC (ICD-789.06) DIVERTICULOSIS, COLON (ICD-562.10) Hx of OVARIAN CYST (ICD-620.2) GERD (ICD-530.81) NEPHROLITHIASIS, HX OF (ICD-V13.01) OSTEOARTHRITIS (ICD-715.90) MITRAL VALVE PROLAPSE (ICD-424.0) COLONIC POLYPS, ADENOMATOUS, HX OF (ICD-V12.72) GASTRITIS, CHRONIC (ICD-535.10) HIATAL HERNIA (ICD-553.3) CHOLELITHIASIS (ICD-574.20) HELICOBACTER PYLORI INFECTION, HX OF (ICD-V12.71)  Past Surgical History: Reviewed history from 10/28/2008 and no changes required. Kidney stone removal x 2 Cholecystectomy  Family History: Reviewed history from 08/10/2007 and no changes required. Family History of Breast Cancer: Paternal Aunt No FH of Colon Cancer Family History of Prostate Cancer: Father Family History of Diabetes: Maternal Grandmother Family History of Heart Disease: Maternal Grandmother, Maternal Grandfather  Social History: Reviewed history from 08/10/2007 and no changes required. Alcohol Use - yes Illicit Drug Use - no Patient has never smoked.  Daily Caffeine Use  Review of Systems  The patient denies allergy/sinus, anemia, anxiety-new, arthritis/joint  pain, back pain, blood in urine, breast changes/lumps, change in vision, confusion, cough, coughing up blood, depression-new,  fainting, fatigue, fever, headaches-new, hearing problems, heart murmur, heart rhythm changes, itching, menstrual pain, muscle pains/cramps, night sweats, nosebleeds, pregnancy symptoms, shortness of breath, skin rash, sleeping problems, sore throat, swelling of feet/legs, swollen lymph glands, thirst - excessive , urination - excessive , urination changes/pain, urine leakage, vision changes, and voice change.         Pertinent positive and negative review of systems were noted in the above HPI. All other ROS was otherwise negative.   Vital Signs:  Patient profile:   60 year old female Menstrual status:  postmenopausal Height:      64.5 inches Weight:      170 pounds BMI:     28.83 Pulse rate:   84 / minute Pulse rhythm:   regular BP sitting:   118 / 78  (left arm)  Vitals Entered By: Milford Cage NCMA (February 21, 2010 3:04 PM)  Physical Exam  General:  Well developed, well nourished, no acute distress. Mouth:  No deformity or lesions, dentition normal. Neck:  Supple; no masses or thyromegaly. Lungs:  Clear throughout to auscultation. Heart:  Regular rate and rhythm; no murmurs, rubs,  or bruits. Abdomen:  mildly protuberant abdomen. Normal active bowel sounds. Tenderness about the umbilical stent. No palpable mass or abnormal pulsations. Lower abdomen unremarkable. Extremities:  No clubbing, cyanosis, edema or deformities noted. Skin:  Intact without significant lesions or rashes. Psych:  Alert and cooperative. Normal mood and affect.   Impression & Recommendations:  Problem # 1:  ABDOMINAL PAIN, EPIGASTRIC (ICD-789.06)  Patient has epigastric abdominal pain. This is a recurrent symptoms. She has a history of H. pylori and possible bile reflux gastritis. She is status post recent cholecystectomy. Nexium seems to have improved some of her symptoms. She will continue on Nexium 40 mg p.o. b.i.d. and we will add Carafate 1 g twice a day. She is scheduled for a repeat upper endoscopy  with biopsies.  Orders: EGD (EGD)  Problem # 2:  COLONIC POLYPS, ADENOMATOUS, HX OF (ICD-V12.72) Patient's last colonoscopy was in 2003. She will be due for a repeat colonoscopy in November 2013.  Patient Instructions: 1)  You have been scheduled for an endoscopy. Please follow written prep instructions that were given to you today at your visit. 2)  Please pick up your prescriptions at the pharmacy. Electronic prescription(s) has already been sent for Carafate 1 gram tablet by mouth two times a day. 3)  recall colonoscopy November 2013  4)  Please pick up your prescriptions at the pharmacy. Electronic prescription(s) has already been sent for Nexium 40 mg by mouth two times a day. 5)  The medication list was reviewed and reconciled.  All changed / newly prescribed medications were explained.  A complete medication list was provided to the patient / caregiver. Prescriptions: CARAFATE 1 GM TABS (SUCRALFATE) Take 1 tablet by mouth two times a day  #40 x 0   Entered by:   Lamona Curl CMA (AAMA)   Authorized by:   Hart Carwin MD   Signed by:   Lamona Curl CMA (AAMA) on 02/21/2010   Method used:   Electronically to        [facility] [phone]* (retail)       [address]       [place], [place]  [address]  Ph: [phone]       Fax: [phone]   RxID:   [id number] NEXIUM 40 MG  CPDR (ESOMEPRAZOLE MAGNESIUM) Take 1 tablet by mouth two times a day  #60 x 1   Entered by:   Lamona Curl CMA (AAMA)   Authorized by:   Hart Carwin MD   Signed by:   Lamona Curl CMA (AAMA) on 02/21/2010   Method used:   Electronically to        [facility] [phone]* (retail)       [address]       [place], [place]  [address]       Ph: [phone]       Fax: [phone]   RxID:   [id number]

## 2010-02-28 NOTE — Letter (Signed)
Summary: EGD Instructions  [facility]  [address] [place], [place] [address]   Phone: [phone]  Fax: [phone]       Anna Marquez    1950-11-05    MRN: [id number]       Procedure Day /Date: Thursday 03/08/10     Arrival Time: 1:00 pm     Procedure Time: 2:00 pm     Location of Procedure:                    _ x _ [facility] (4th Floor)  PREPARATION FOR ENDOSCOPY   On 03/08/10 THE DAY OF THE PROCEDURE:  1.   No solid foods, milk or milk products are allowed after midnight the night before your procedure.  2.   Do not drink anything colored red or purple.  Avoid juices with pulp.  No orange juice.  3.  You may drink clear liquids until 12:00 pm, which is 2 hours before your procedure.                                                                                                CLEAR LIQUIDS INCLUDE: Water Jello Ice Popsicles Tea (sugar ok, no milk/cream) Powdered fruit flavored drinks Coffee (sugar ok, no milk/cream) Gatorade Juice: apple, white grape, white cranberry  Lemonade Clear bullion, consomm, broth Carbonated beverages (any kind) Strained chicken noodle soup Hard Candy   MEDICATION INSTRUCTIONS  Unless otherwise instructed, you should take regular prescription medications with a small sip of water as early as possible the morning of your procedure.                    OTHER INSTRUCTIONS  You will need a responsible adult at least 61 years of age to accompany you and drive you home.   This person must remain in the waiting room during your procedure.  Wear loose fitting clothing that is easily removed.  Leave jewelry and other valuables at home.  However, you may wish to bring a book to read or an iPod/MP3 player to listen to music as you wait for your procedure to start.  Remove all body piercing jewelry and leave at home.  Total time from sign-in until discharge is approximately 2-3 hours.  You should  go home directly after your procedure and rest.  You can resume normal activities the day after your procedure.  The day of your procedure you should not:   Drive   Make legal decisions   Operate machinery   Drink alcohol   Return to work  You will receive specific instructions about eating, activities and medications before you leave.    The above instructions have been reviewed and explained to me by   _______________________    I fully understand and can verbalize these instructions _____________________________ Date _________

## 2010-03-08 ENCOUNTER — Other Ambulatory Visit: Payer: Self-pay | Admitting: Internal Medicine

## 2010-03-08 ENCOUNTER — Encounter: Payer: Self-pay | Admitting: Internal Medicine

## 2010-03-08 ENCOUNTER — Other Ambulatory Visit (AMBULATORY_SURGERY_CENTER): Payer: Managed Care, Other (non HMO) | Admitting: Internal Medicine

## 2010-03-08 DIAGNOSIS — R109 Unspecified abdominal pain: Secondary | ICD-10-CM

## 2010-03-08 DIAGNOSIS — D131 Benign neoplasm of stomach: Secondary | ICD-10-CM

## 2010-03-08 DIAGNOSIS — K219 Gastro-esophageal reflux disease without esophagitis: Secondary | ICD-10-CM

## 2010-03-08 DIAGNOSIS — K2960 Other gastritis without bleeding: Secondary | ICD-10-CM

## 2010-03-13 ENCOUNTER — Encounter: Payer: Self-pay | Admitting: Internal Medicine

## 2010-03-15 NOTE — Miscellaneous (Signed)
Summary: Carafate and Nexium Refills  Clinical Lists Changes  Medications: Rx of NEXIUM 40 MG  CPDR (ESOMEPRAZOLE MAGNESIUM) Take 1 tablet by mouth two times a day;  #60 x 11;  Signed;  Entered by: Durwin Glaze RN;  Authorized by: Hart Carwin MD;  Method used: Electronically to [facility] #[id number]*, [address] [place], [place], [place]  [address], Ph: [phone], Fax: [phone] Rx of CARAFATE 1 GM TABS (SUCRALFATE) Take 1 tablet by mouth two times a day;  #40 x 2;  Signed;  Entered by: Durwin Glaze RN;  Authorized by: Hart Carwin MD;  Method used: Electronically to [facility] #[id number]*, [address] [place], [place], [place]  [address], Ph: [phone], Fax: [phone]    Prescriptions: CARAFATE 1 GM TABS (SUCRALFATE) Take 1 tablet by mouth two times a day  #40 x 2   Entered by:   Durwin Glaze RN   Authorized by:   Hart Carwin MD   Signed by:   Durwin Glaze RN on 03/08/2010   Method used:   Electronically to        [facility] [phone]* (retail)       [address]       [place], [place]  [address]       Ph: [phone]       Fax: [phone]   RxID:   [id number] NEXIUM 40 MG  CPDR (ESOMEPRAZOLE MAGNESIUM) Take 1 tablet by mouth two times a day  #60 x 11   Entered by:   Durwin Glaze RN   Authorized by:   Hart Carwin MD   Signed by:   Durwin Glaze RN on 03/08/2010   Method used:   Electronically to        [facility] [phone]* (retail)       [address]       [place], [place]  [address]       Ph: [phone]       Fax: [phone]   RxID:   [id number]

## 2010-03-15 NOTE — Procedures (Addendum)
Summary: Upper Endoscopy  Patient: [name] Note: All result statuses are Final unless otherwise noted.  Tests: (1) Upper Endoscopy (EGD)   EGD Upper Endoscopy       DONE     [facility]     [address]     [place], [place]  [address]          ENDOSCOPY PROCEDURE REPORT          PATIENT:  Anna, Marquez  MR#:  [id number]     BIRTHDATE:  1950/02/12, 60 yrs. old  GENDER:  female          ENDOSCOPIST:  Hedwig Morton. Juanda Chance, MD     Referred by:  Eugenio Hoes Tawanna Cooler, M.D.          PROCEDURE DATE:  03/08/2010     PROCEDURE:  EGD with biopsy, [address]     ASA CLASS:  Class II     INDICATIONS:  abdominal pain, GERD refractory to PPI's     last EGD in [place] H.Pylori          MEDICATIONS:   Versed 5 mg, Fentanyl 50 mcg     TOPICAL ANESTHETIC:  Exactacain Spray          DESCRIPTION OF PROCEDURE:   After the risks benefits and     alternatives of the procedure were thoroughly explained, informed     consent was obtained.  The LB GIF-H180 [id number] endoscope was     introduced through the mouth and advanced to the second portion of     the duodenum, without limitations.  The instrument was slowly     withdrawn as the mucosa was fully examined.     <<PROCEDUREIMAGES>>          A hiatal hernia was found (see image1, image2, image5, image8,     image10, and image9). 3 cm nonreducible hiatal hernia eccentric     hiatus  There were multiple polyps identified. fundic gland polyps     With standard forceps, a biopsy was obtained and sent to pathology     (see image6).  Mild gastritis was found in the antrum. With     standard forceps, a biopsy was obtained and sent to pathology (see     image7 and image3).  Otherwise the examination was normal (see     image4).    Retroflexed views revealed no abnormalities.    The     scope was then withdrawn from the patient and the procedure     completed.          COMPLICATIONS:  None          ENDOSCOPIC IMPRESSION:     1) Hiatal  hernia     2) Polyps, multiple     3) Mild gastritis in the antrum     4) Otherwise normal examination     RECOMMENDATIONS:     1) Await pathology results     2) Anti-reflux regimen to be follow     continue Carafate 1 gm po bid     continue Nexiem 40 mg po bid          REPEAT EXAM:  In 0 year(s) for.          ______________________________     Hedwig Morton. Juanda Chance, MD          CC:          n.     eSIGNED:   Hedwig Morton. Juanda Chance  at 03/08/2010 02:56 PM          Arvella Merles, [id number]  Note: An exclamation mark (!) indicates a result that was not dispersed into the flowsheet. Document Creation Date: 03/08/2010 2:56 PM _______________________________________________________________________  (1) Order result status: Final Collection or observation date-time: 03/08/2010 14:48 Requested date-time:  Receipt date-time:  Reported date-time:  Referring Physician:   Ordering Physician: Lina Sar [phone]) Specimen Source:  Source: Launa Grill Order Number: [id number] Lab site:

## 2010-03-20 NOTE — Letter (Signed)
Summary: Patient [facility] Biopsy Results  [facility]  [address] [place], [place] [address]   Phone: [phone]  Fax: [phone]        March 13, 2010 MRN: [id number]    HELAYNE METSKER [address] [place], [place]  [address]    Dear Ms. Esquivel,  I am pleased to inform you that the biopsies taken during your recent endoscopic examination did not show any evidence of cancer upon pathologic examination.The H.Pylori test was negative.  Additional information/recommendations:  __No further action is needed at this time.  Please follow-up with      your primary care physician for your other healthcare needs.  __ Please call [phone] to schedule a return visit to review      your condition.  x__ Continue with the treatment plan as outlined on the day of your      exam.     Please call us if you are having persistent problems or have questions about your condition that have not been fully answered at this time.  Sincerely,  Hart Carwin MD  This letter has been electronically signed by your physician.  Appended Document: Patient Notice-Endo Biopsy Results letter mailed

## 2010-05-10 ENCOUNTER — Other Ambulatory Visit: Payer: Self-pay | Admitting: Internal Medicine

## 2010-05-22 NOTE — Op Note (Signed)
Anna Marquez, Anna Marquez                ACCOUNT NO.:  [id number]   MEDICAL RECORD NO.:  [id number]          PATIENT TYPE:  AMB   LOCATION:  DAY                          FACILITY:  [facility]   PHYSICIAN:  Thomas A. Cornett, M.D.DATE OF BIRTH:  1950-12-25   DATE OF PROCEDURE:  10/26/2007  DATE OF DISCHARGE:                               OPERATIVE REPORT   PREOPERATIVE DIAGNOSIS:  Symptomatic cholelithiasis with biliary  dyskinesia.   POSTOPERATIVE DIAGNOSIS:  Symptomatic cholelithiasis with biliary  dyskinesia.   PROCEDURE:  Laparoscopic cholecystectomy with intraoperative  cholangiogram.   ANESTHESIA:  General endotracheal anesthesia and 0.25% Sensorcaine  local.   ESTIMATED BLOOD LOSS:  20 mL.   SPECIMEN:  Gallbladder to pathology with small gallstones.   DRAINS:  None.   INDICATIONS FOR PROCEDURE:  The patient is a pleasant 60 year old female  with symptomatic cholelithiasis.  She also had a HIDA study done with an  ejection fraction showed dysfunctional gallbladder function with a  depressed ejection fraction.  She was also having upper quadrant pain  and symptoms from all this.  She presents for laparoscopic  cholecystectomy with cholangiogram after discussion of options.   DESCRIPTION OF PROCEDURE:  The patient was brought to the operating room  after informed consent.  Please see history and physical for this.  After induction of general anesthesia, the abdomen was prepped and  draped in a sterile fashion.  A 1-cm infraumbilical incision was made.  Dissection was carried down to the fascia, and the fascia was opened  with a scalpel blade carefully.  I was able to use a small hemostat and  opened the peritoneal lining to directly visualize the abdominal cavity.  A pursestring suture of 0 Vicryl was placed, and a 12-mm Hasson cannula  was placed under direct vision.  Pneumoperitoneum was created to 15 mmHg  of CO2.  The laparoscope was placed.  The patient was placed in  reverse  Trendelenburg and rolled to her left.  Upon inspection of the abdominal  cavity, no obvious intra-abdominal abnormality was noted.  The  gallbladder was minimally inflamed.  A subxiphoid 11-mm port was placed  and two subsequent 5-mm ports were placed in the right upper quadrant.  The gallbladder was grasped at the dome and retracted to the patient's  right shoulder.  A second grasper was used to grab the infundibulum and  pull it to the patient's right lower quadrant.  There was a scant amount  of omental adhesions I took down with cautery.   I was then able to dissect around the cystic duct at the junction of the  infundibulum and cystic duct.  A clip was placed on the gallbladder side  of the cystic duct.  The critical angle was achieved.  Small incisions  were made in the cystic duct, and a Cook cholangiogram catheter was  placed and held in place by a small clip.  Intraoperative cholangiogram  using one-half-strength Hypaque dye and fluoroscopy was then performed.  There was a long tortuous cystic duct with free flow of contrast in the  bile duct into the duodenum.  The distal portion of the pancreatic duct  was visualized as well.  The common hepatic duct was well visualized  with the bifurcation in to right and left ducts noted without sign of  stone, stricture, extravasation.  We removed the catheter and placed  four clips across the cystic duct and divided it.  The cystic artery was  divided between clips in a similar fashion as well.  The posterior  branch of the cystic artery was also controlled with clips and divided.  Cautery was used to isolate the gallbladder from the gallbladder fossa  without difficulty.  The gallbladder was then placed in an EndoCatch  bag.   Irrigation was then used to irrigate out the gallbladder fossa.  No  evidence of bile leakage nor bleeding.  We then placed the camera in the  subxiphoid port, extracted the gallbladder through the  umbilical port,  and passed it off the field and closed the fascia at the umbilicus with  the existing pursestring suture.  We reinspected the gallbladder bed and  found it to be hemostatic, without signs of bleeding or bile leakage.  Our ports were then removed.  After suctioning excess irrigation, the  CO2 was allowed to escape.  We closed the skin incisions with 4-0  Monocryl and then Dermabond.  All final counts of sponge, needles, and  instruments were found to be correct at this portion of the case.  The  patient was extubated and taken to recovery in satisfactory condition.      Thomas A. Cornett, M.D.  Electronically Signed     TAC/MEDQ  D:  10/26/2007  T:  10/26/2007  Job:  [id number]   cc:   Tinnie Gens A. Tawanna Cooler, MD  [address] [place]  [place] [address]   Hedwig Morton. Juanda Chance, MD  [address] [place] [address]  [place]  [place] [address]

## 2010-05-22 NOTE — Assessment & Plan Note (Signed)
[facility]                         GASTROENTEROLOGY OFFICE NOTE   LILYROSE, TANNEY                           MRN:          [id number]  DATE:01/19/2007                            DOB:          April 27, 1950    Ms. Anna Marquez is a 60 year old white female patient of Dr. Tawanna Cooler, who has  epigastric pain.  She has been evaluated previously for the same  symptoms and was found to have a positive H. pylori test and was treated  in February 2007.  She also was found to have cholelithiasis on CT scan  of the abdomen in 2006 but it was decided that her gallbladder did not  appear to be symptomatic.  We have seen her in 1999 and 2003 for  colonoscopy, where she was found to have an adenomatous polyp of the  colon.  Last endoscopy was done by Dr. Dan Humphreys in [place] in February  2007 with findings of a hiatal hernia and chronic gastritis.  She called  several weeks ago with increasing epigastric pain.  We have increased  her regimen to include Carafate 1 mg p.o. q.i.d. and Nexium 40 mg p.o.  b.i.d., which seem to have improved her symptoms by 50%.  She still has  to be very careful of what she eats and she has pain in the epigastrium  anteriorly without radiation to the back.  The pain does not wake her up  at night.   MEDICATIONS:  1. Nadolol 20 mg p.o. daily.  2. Hydrochlorothiazide 25 mg p.o. daily.  3. Nexium 40 mg p.o. b.i.d.  4. Carafate slurry one teaspoon a.c.  5. Levbid 0.375 mg p.o. b.i.d., which makes her sleepy.   PHYSICAL EXAM:  Blood pressure 122/80, pulse 72, and weight 166 pounds.  She was alert, oriented, in no distress.  LUNGS:  Clear to auscultation.  CARDIAC:  Normal S1, normal S2.  ABDOMEN:  Flat, soft but somewhat tender in the epigastrium in the  midline and above the umbilicus.  There was no rebound, pulsation or  murmur.  The low abdomen was unremarkable.  RECTAL:  Exam not repeated.   IMPRESSION:  14. A 60 year old white female with  ongoing epigastric pain suggestive      of chronic gastritis.  She has been improved on Carafate and high-      dose Nexium.  Question is whether this possibly represents      symptomatic gallbladder disease.  She saw Dr. Luisa Hart for surgical      consultation in 2006 and it was their mutual decision not to      proceed with cholecystectomy.  She also has the possibility of      recurrence of the Helicobacter pylori gastropathy which was treated      in 2006.  2. The patient has a history of adenomatous polyps of the colon and is      due for repeat colonoscopy.   PLAN:  1. HIDA scan to assess gallbladder function.  She might have had a      HIDA scan two years ago ordered by Dr. Tawanna Cooler,  but I do not have the      results.  2. Continue Carafate and Nexium.  3. Add Levsin sublingually since Levbid seems to make her sleepy and      the antispasmodics are helpful in reducing her epigastric      discomfort.  4. Low-fat diet.  5. She will need colonoscopy but at this point wants to wait until her      epigastric pain evaluation has been completed.  If her HIDA scan is      positive, then I would think she needs to see Dr. Luisa Hart again.  I      would also consider doing an upper endoscopy and test her for H.      pylori recurrence or treat her empirically for H. pylor. We will do      that depending on the results of the HIDA scan.     Hedwig Morton. Juanda Chance, MD  Electronically Signed    DMB/MedQ  DD: 01/19/2007  DT: 01/20/2007  Job #: [id number]   cc:   Tinnie Gens A. Tawanna Cooler, MD

## 2010-05-22 NOTE — Assessment & Plan Note (Signed)
[facility]                         GASTROENTEROLOGY OFFICE NOTE   DAYLEN, HACK                           MRN:          [id number]  DATE:05/21/2006                            DOB:          Jun 17, 1950    Ms. Kreher is a very nice 60 year old white female whom we saw in the  past for a screening colonoscopy and cecal polyp on colonoscopy in 1999  and again in 2003.  She is here today because of symptoms of burning  substernally, epigastric pain, and a history of gastroesophageal reflux,  which has bothered her for many years.  This has never been evaluated  endoscopically.  Ms. Savannah has, in the last few months, developed pain  substernally, as well as early satiety, which bothers her  postprandially, and was not initially relieved by Nexium 40 mg a day.  She increased the Nexium to 40 mg twice a day for several days with some  improvement of her symptoms.  Her pain is at least 50% improved now.  Her initial symptoms also included postprandial diarrhea, but in that  respect, she has improved.  She denies any dysphagia to solids or  liquids.  In 2005, the patient had exacerbation of similar symptoms and  was seen in [facility] by Dr. Yates Decamp and underwent upper endoscopy.  We do not have the report, but we have requested it.  She was treated  for gastroesophageal reflux.  She takes occasional Aleve for  arthralgias, but denies smoking or drinking excessive alcohol.   PAST HISTORY:  Significant for asthmatic bronchitis, mitral valve  prolapse, osteoarthritis, kidney stones.  Allergies.   FAMILY HISTORY:  Positive for diabetes in maternal grandmother, breast  cancer in paternal aunt, prostate cancer in father, and heart disease in  maternal grandparents.   SOCIAL HISTORY:  Divorced with 2 children.  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.  She does not smoke.  Drinks  alcohol socially.   REVIEW OF SYSTEMS:   Positive for weight gain of about 8 pounds, eye  glasses, allergies.  Swelling of the feet.  Arthritic complaints.   PHYSICAL EXAM:  Blood pressure 122/76, pulse 68, weight 157 pounds.  She was alert and oriented, in no distress.  LUNGS:  Clear to auscultation.  NECK:  Supple without adenopathy.  COR:  Quiet precordium.  Normal S1, S2.  ABDOMEN:  Soft.  Somewhat protuberant.  Tender in the epigastrium in the  midline.  Not radiating to the left or right upper quadrant.  No CVA  tenderness.  Liver edge at costal margin.  RECTAL:  Normal exam with soft Hemoccult negative stool.   CT scan of the abdomen and pelvis in June 2007 showed known calcified  stones in the gallbladder, versus polyps.  She, at that time, saw Dr.  Luisa Hart for surgical opinion, and had a HIDA scan, but the outcome was  favorable in that Dr. Luisa Hart did not think she needed cholecystectomy.   IMPRESSION:  75. A 60 year old white female with chronic symptoms of  gastroesophageal reflux disease and acute exacerbation of abdominal      pain, likely related to gastroesophageal reflux.  Aleve may be a      contributing factor.  We cannot rule out possibility of symptomatic      gallbladder disease although symptoms are most suggestive of      gastritis or gastroesophageal reflux.  2. History of colon polyps.   PLAN:  1. Obtain report of upper endoscopy from 2005 from Dr. Dan Humphreys.  2. Continue Nexium 40 mg daily.  3. Add Carafate slurry 1 teaspoon p.o. q.i.d. electronically      prescribed.  4. Call back in 2 weeks if no improvement.  Will proceed with upper      endoscopy after reviewing the report from Dr. Dan Humphreys.  If the      symptoms continue or if they change to where it is more consistent,      such as suggestive for biliary disease, we may repeat the HIDA      scan.   ADDENDUM:  I have asked the patient not to take any Aleve until the pain  resolves.     Hedwig Morton. Juanda Chance, MD     DMB/MedQ  DD:  05/21/2006  DT: 05/21/2006  Job #: [id number]   cc:   Tinnie Gens A. Tawanna Cooler, MD

## 2010-06-29 ENCOUNTER — Other Ambulatory Visit: Payer: Self-pay | Admitting: Family Medicine

## 2010-06-29 DIAGNOSIS — Z1231 Encounter for screening mammogram for malignant neoplasm of breast: Secondary | ICD-10-CM

## 2010-07-03 ENCOUNTER — Ambulatory Visit (HOSPITAL_BASED_OUTPATIENT_CLINIC_OR_DEPARTMENT_OTHER): Payer: Managed Care, Other (non HMO)

## 2010-07-16 ENCOUNTER — Ambulatory Visit (HOSPITAL_BASED_OUTPATIENT_CLINIC_OR_DEPARTMENT_OTHER): Payer: Managed Care, Other (non HMO)

## 2010-07-24 ENCOUNTER — Ambulatory Visit (HOSPITAL_BASED_OUTPATIENT_CLINIC_OR_DEPARTMENT_OTHER)
Admission: RE | Admit: 2010-07-24 | Discharge: 2010-07-24 | Disposition: A | Discharge status: Home / Self Care | Payer: Managed Care, Other (non HMO) | Source: Ambulatory Visit | Attending: Family Medicine | Admitting: Family Medicine

## 2010-07-24 DIAGNOSIS — Z1231 Encounter for screening mammogram for malignant neoplasm of breast: Secondary | ICD-10-CM | POA: Insufficient documentation

## 2010-10-09 LAB — DIFFERENTIAL
Basophils Relative: 1
Eosinophils Absolute: 0.1
Monocytes Absolute: 0.3
Monocytes Relative: 8

## 2010-10-09 LAB — CBC
Hemoglobin: 12.6
MCHC: 34
MCV: 89.1
RBC: 4.17

## 2010-10-09 LAB — BASIC METABOLIC PANEL
CO2: 28
Chloride: 106
GFR calc Af Amer: >60
Sodium: 140

## 2010-10-30 ENCOUNTER — Other Ambulatory Visit (INDEPENDENT_AMBULATORY_CARE_PROVIDER_SITE_OTHER): Payer: Managed Care, Other (non HMO)

## 2010-10-30 DIAGNOSIS — Z0000 Encounter for general adult medical examination without abnormal findings: Secondary | ICD-10-CM

## 2010-10-30 LAB — TSH: TSH: 0.33 u[IU]/mL — ABNORMAL LOW (ref 0.35–5.50)

## 2010-10-30 LAB — POCT URINALYSIS DIPSTICK
Bilirubin, UA: NEGATIVE
Glucose, UA: NEGATIVE
Ketones, UA: NEGATIVE
Spec Grav, UA: 1.015

## 2010-10-30 LAB — LIPID PANEL
Cholesterol: 200 mg/dL (ref 0–200)
LDL Cholesterol: 111 mg/dL — ABNORMAL HIGH (ref 0–99)
Triglycerides: 150 mg/dL — ABNORMAL HIGH (ref 0.0–149.0)

## 2010-10-30 LAB — CBC WITH DIFFERENTIAL/PLATELET
Basophils Absolute: 0 10*3/uL (ref 0.0–0.1)
Eosinophils Absolute: 0.1 10*3/uL (ref 0.0–0.7)
Eosinophils Relative: 1.6 % (ref 0.0–5.0)
MCV: 90.7 fl (ref 78.0–100.0)
Monocytes Absolute: 0.6 10*3/uL (ref 0.1–1.0)
Neutrophils Relative %: 59.6 % (ref 43.0–77.0)
Platelets: 186 10*3/uL (ref 150.0–400.0)
RDW: 14.2 % (ref 11.5–14.6)
WBC: 6.7 10*3/uL (ref 4.5–10.5)

## 2010-10-30 LAB — HEPATIC FUNCTION PANEL
ALT: 16 U/L (ref 0–35)
AST: 16 U/L (ref 0–37)
Alkaline Phosphatase: 45 U/L (ref 39–117)
Bilirubin, Direct: 0.1 mg/dL (ref 0.0–0.3)
Total Bilirubin: 1.6 mg/dL — ABNORMAL HIGH (ref 0.3–1.2)

## 2010-10-30 LAB — BASIC METABOLIC PANEL
BUN: 16 mg/dL (ref 6–23)
Chloride: 98 mEq/L (ref 96–112)
Creatinine, Ser: 0.9 mg/dL (ref 0.4–1.2)
GFR: 69.26 mL/min (ref >60.00)

## 2010-11-09 ENCOUNTER — Encounter: Payer: Self-pay | Admitting: Family Medicine

## 2010-11-12 ENCOUNTER — Encounter: Payer: Managed Care, Other (non HMO) | Admitting: Family Medicine

## 2010-11-13 ENCOUNTER — Other Ambulatory Visit: Payer: Self-pay | Admitting: Family Medicine

## 2010-12-18 ENCOUNTER — Encounter: Payer: Self-pay | Admitting: Family Medicine

## 2010-12-18 ENCOUNTER — Ambulatory Visit (INDEPENDENT_AMBULATORY_CARE_PROVIDER_SITE_OTHER): Payer: Managed Care, Other (non HMO) | Admitting: Family Medicine

## 2010-12-18 DIAGNOSIS — Z01419 Encounter for gynecological examination (general) (routine) without abnormal findings: Secondary | ICD-10-CM | POA: Insufficient documentation

## 2010-12-18 DIAGNOSIS — K449 Diaphragmatic hernia without obstruction or gangrene: Secondary | ICD-10-CM

## 2010-12-18 DIAGNOSIS — G43009 Migraine without aura, not intractable, without status migrainosus: Secondary | ICD-10-CM

## 2010-12-18 DIAGNOSIS — K219 Gastro-esophageal reflux disease without esophagitis: Secondary | ICD-10-CM

## 2010-12-18 DIAGNOSIS — M1990 Unspecified osteoarthritis, unspecified site: Secondary | ICD-10-CM

## 2010-12-18 DIAGNOSIS — Z0000 Encounter for general adult medical examination without abnormal findings: Secondary | ICD-10-CM

## 2010-12-18 DIAGNOSIS — N952 Postmenopausal atrophic vaginitis: Secondary | ICD-10-CM

## 2010-12-18 DIAGNOSIS — R319 Hematuria, unspecified: Secondary | ICD-10-CM

## 2010-12-18 DIAGNOSIS — I059 Rheumatic mitral valve disease, unspecified: Secondary | ICD-10-CM

## 2010-12-18 MED ORDER — ESOMEPRAZOLE MAGNESIUM 40 MG PO CPDR: DELAYED_RELEASE_CAPSULE | ORAL | Status: DC

## 2010-12-18 MED ORDER — NADOLOL 40 MG PO TABS: 40.0000 mg | ORAL_TABLET | Freq: Every day | ORAL | Status: DC

## 2010-12-18 MED ORDER — ESTROGENS, CONJUGATED 0.625 MG/GM VA CREA: 0.5000 g | TOPICAL_CREAM | Freq: Every day | VAGINAL | Status: DC

## 2010-12-18 MED ORDER — SUCRALFATE 1 G PO TABS: 1.0000 g | ORAL_TABLET | Freq: Two times a day (BID) | ORAL | Status: DC

## 2010-12-18 MED ORDER — HYDROCODONE-ACETAMINOPHEN 5-500 MG PO TABS: 1.0000 | ORAL_TABLET | Freq: Four times a day (QID) | ORAL | Status: DC | PRN

## 2010-12-18 MED ORDER — FLUTICASONE PROPIONATE 50 MCG/ACT NA SUSP: 1.0000 | Freq: Every day | NASAL | Status: DC

## 2010-12-18 MED ORDER — HYDROCHLOROTHIAZIDE 25 MG PO TABS: 25.0000 mg | ORAL_TABLET | Freq: Every day | ORAL | Status: DC

## 2010-12-18 NOTE — Progress Notes (Signed)
  Subjective:    Patient ID: Anna Marquez, female    DOB: 06/25/1950, 60 y.o.   MRN: [id number]  HPI Zahari Fazzino is a delightful, 60 year old, single female, nonsmoker, who comes in today for a general physical examination because of a history of allergic rhinitis, severe reflux esophagitis with a hiatal hernia, fluid retention, migraine headaches.  Her migraines have diminished now only two to 3 per year.  She typically just takes a Vicodin and goes to bed.  She gets routine eye care, dental care, BSE monthly, and your mammography, colonoscopy, and GI, tetanus, 2009, seasonal flu shot 2012, information given on shingles.  She also uses a hormonal cream for vaginal dryness   Review of Systems  Constitutional: Negative.   HENT: Negative.   Eyes: Negative.   Respiratory: Negative.   Cardiovascular: Negative.   Gastrointestinal: Negative.   Genitourinary: Negative.   Musculoskeletal: Negative.   Neurological: Negative.   Hematological: Negative.   Psychiatric/Behavioral: Negative.        Objective:   Physical Exam  Constitutional: She appears well-developed and well-nourished.  HENT:  Head: Normocephalic and atraumatic.  Right Ear: External ear normal.  Left Ear: External ear normal.  Nose: Nose normal.  Mouth/Throat: Oropharynx is clear and moist.  Eyes: EOM are normal. Pupils are equal, round, and reactive to light.  Neck: Normal range of motion. Neck supple. No thyromegaly present.  Cardiovascular: Normal rate, regular rhythm, normal heart sounds and intact distal pulses.  Exam reveals no gallop and no friction rub.   No murmur heard. Pulmonary/Chest: Effort normal and breath sounds normal.  Abdominal: Soft. Bowel sounds are normal. She exhibits no distension and no mass. There is no tenderness. There is no rebound.  Genitourinary: Vagina normal and uterus normal. Guaiac negative stool. No vaginal discharge found.       Bilateral breast exam shows the breast to be  symmetrical, skin, normal, areola and nipples normal.  No drainage or discharge, no palpable masses  Musculoskeletal: Normal range of motion.  Lymphadenopathy:    She has no cervical adenopathy.  Neurological: She is alert. She has normal reflexes. No cranial nerve deficit. She exhibits normal muscle tone. Coordination normal.  Skin: Skin is warm and dry.  Psychiatric: She has a normal mood and affect. Her behavior is normal. Judgment and thought content normal.          Assessment & Plan:  Healthy female.  Allergic rhinitis, which two Flonase nasal spray.  Reflux esophagitis.  Continue Nexium 40 mg b.i.d., Carafate 1 g b.i.d., and the mechanical measures.  Fluid retention.  Continue hydrochlorothiazide 25 daily.  Migraine headaches diminishing over time.  Vicodin p.r.n.  Chronic sun damage.  History of dysplastic nevi.  Skin exam q.6 months.  Vaginal dryness continued hormonal cream

## 2010-12-18 NOTE — Patient Instructions (Signed)
Continue your current medications.  Return one year, sooner if any problems.  Return in 6 months for a skin exam

## 2010-12-19 ENCOUNTER — Other Ambulatory Visit (HOSPITAL_COMMUNITY)
Admission: RE | Admit: 2010-12-19 | Discharge: 2010-12-19 | Disposition: A | Discharge status: Home / Self Care | Payer: Managed Care, Other (non HMO) | Source: Ambulatory Visit | Attending: Family Medicine | Admitting: Family Medicine

## 2011-03-01 ENCOUNTER — Other Ambulatory Visit: Payer: Self-pay | Admitting: Family Medicine

## 2011-03-01 DIAGNOSIS — Z139 Encounter for screening, unspecified: Secondary | ICD-10-CM

## 2011-07-12 ENCOUNTER — Telehealth: Payer: Self-pay | Admitting: Internal Medicine

## 2011-07-12 DIAGNOSIS — K219 Gastro-esophageal reflux disease without esophagitis: Secondary | ICD-10-CM

## 2011-07-12 MED ORDER — SUCRALFATE 1 G PO TABS: 1.0000 g | ORAL_TABLET | Freq: Two times a day (BID) | ORAL | Status: DC

## 2011-07-12 NOTE — Telephone Encounter (Signed)
Patient calling to report for the last 6 weeks, she had had an increase in indigestion, burning and "swelling under breast bone at waist". She is taking Nexium BID. She wants to know if she should start on Carafate or schedule OV. Last EGD 03/2010- hiatal hernia, mild gastritis. Please, advise.

## 2011-07-12 NOTE — Telephone Encounter (Signed)
Please start on Carafate ? Slurry or tabs, 1gm, #60 , 1 po bid, 1 refill

## 2011-07-12 NOTE — Telephone Encounter (Signed)
Rx sent. Patient given Dr. Regino Schultze recommendation

## 2011-07-25 ENCOUNTER — Ambulatory Visit (HOSPITAL_BASED_OUTPATIENT_CLINIC_OR_DEPARTMENT_OTHER)
Admission: RE | Admit: 2011-07-25 | Discharge: 2011-07-25 | Disposition: A | Discharge status: Home / Self Care | Payer: BC Managed Care – PPO | Source: Ambulatory Visit | Attending: Family Medicine | Admitting: Family Medicine

## 2011-07-25 DIAGNOSIS — Z139 Encounter for screening, unspecified: Secondary | ICD-10-CM

## 2011-07-25 DIAGNOSIS — Z1231 Encounter for screening mammogram for malignant neoplasm of breast: Secondary | ICD-10-CM | POA: Insufficient documentation

## 2011-09-12 ENCOUNTER — Other Ambulatory Visit: Payer: Self-pay | Admitting: Internal Medicine

## 2011-12-19 ENCOUNTER — Other Ambulatory Visit: Payer: Self-pay | Admitting: Family Medicine

## 2011-12-27 ENCOUNTER — Other Ambulatory Visit: Payer: Self-pay

## 2011-12-27 ENCOUNTER — Encounter (HOSPITAL_COMMUNITY): Payer: Self-pay | Admitting: *Deleted

## 2011-12-27 ENCOUNTER — Telehealth: Payer: Self-pay | Admitting: Family Medicine

## 2011-12-27 ENCOUNTER — Emergency Department (HOSPITAL_COMMUNITY)
Admission: EM | Admit: 2011-12-27 | Discharge: 2011-12-27 | Disposition: A | Discharge status: Home / Self Care | Payer: BC Managed Care – PPO | Attending: Emergency Medicine | Admitting: Emergency Medicine

## 2011-12-27 ENCOUNTER — Emergency Department (HOSPITAL_COMMUNITY): Payer: BC Managed Care – PPO

## 2011-12-27 DIAGNOSIS — K219 Gastro-esophageal reflux disease without esophagitis: Secondary | ICD-10-CM | POA: Insufficient documentation

## 2011-12-27 DIAGNOSIS — Z860100 Personal history of colon polyps, unspecified: Secondary | ICD-10-CM | POA: Insufficient documentation

## 2011-12-27 DIAGNOSIS — I4891 Unspecified atrial fibrillation: Secondary | ICD-10-CM

## 2011-12-27 DIAGNOSIS — Z79899 Other long term (current) drug therapy: Secondary | ICD-10-CM | POA: Insufficient documentation

## 2011-12-27 DIAGNOSIS — Z8619 Personal history of other infectious and parasitic diseases: Secondary | ICD-10-CM | POA: Insufficient documentation

## 2011-12-27 DIAGNOSIS — Z87442 Personal history of urinary calculi: Secondary | ICD-10-CM | POA: Insufficient documentation

## 2011-12-27 DIAGNOSIS — Z8679 Personal history of other diseases of the circulatory system: Secondary | ICD-10-CM | POA: Insufficient documentation

## 2011-12-27 DIAGNOSIS — Z7982 Long term (current) use of aspirin: Secondary | ICD-10-CM | POA: Insufficient documentation

## 2011-12-27 DIAGNOSIS — M1990 Unspecified osteoarthritis, unspecified site: Secondary | ICD-10-CM | POA: Insufficient documentation

## 2011-12-27 DIAGNOSIS — Z8719 Personal history of other diseases of the digestive system: Secondary | ICD-10-CM | POA: Insufficient documentation

## 2011-12-27 DIAGNOSIS — R110 Nausea: Secondary | ICD-10-CM | POA: Insufficient documentation

## 2011-12-27 DIAGNOSIS — R0789 Other chest pain: Secondary | ICD-10-CM | POA: Insufficient documentation

## 2011-12-27 LAB — CBC WITH DIFFERENTIAL/PLATELET
Eosinophils Absolute: 0.2 10*3/uL (ref 0.0–0.7)
Eosinophils Relative: 2 % (ref 0–5)
Hemoglobin: 14.1 g/dL (ref 12.0–15.0)
Lymphs Abs: 3.8 10*3/uL (ref 0.7–4.0)
MCH: 30.5 pg (ref 26.0–34.0)
MCV: 84.6 fL (ref 78.0–100.0)
Monocytes Relative: 8 % (ref 3–12)
Neutrophils Relative %: 46 % (ref 43–77)
RBC: 4.62 MIL/uL (ref 3.87–5.11)

## 2011-12-27 LAB — COMPREHENSIVE METABOLIC PANEL
Alkaline Phosphatase: 67 U/L (ref 39–117)
BUN: 19 mg/dL (ref 6–23)
GFR calc Af Amer: 85 mL/min — ABNORMAL LOW (ref >90)
Glucose, Bld: 104 mg/dL — ABNORMAL HIGH (ref 70–99)
Potassium: 3.3 mEq/L — ABNORMAL LOW (ref 3.5–5.1)
Total Protein: 7.7 g/dL (ref 6.0–8.3)

## 2011-12-27 LAB — TROPONIN I: Troponin I: <0.3 ng/mL (ref <0.30)

## 2011-12-27 MED ORDER — DILTIAZEM HCL 100 MG IV SOLR: 5.0000 mg/h | INTRAVENOUS | Status: DC

## 2011-12-27 MED ORDER — DILTIAZEM LOAD VIA INFUSION
20.0000 mg | Freq: Once | INTRAVENOUS | Status: DC
  Filled 2011-12-27: qty 20

## 2011-12-27 MED ORDER — SODIUM CHLORIDE 0.9 % IV SOLN: Freq: Once | INTRAVENOUS | Status: DC

## 2011-12-27 NOTE — Telephone Encounter (Signed)
Pt  went to [facility] last night dx A-Fib. Pt daughter is requesting a referral to cardiology. Pt is aware MD out of office next week

## 2011-12-27 NOTE — ED Notes (Addendum)
C/o waking up with her heart racing tonight and having some shortness of breath.  Pt took Nadalol and Aspirin prior to arrival.

## 2011-12-27 NOTE — ED Provider Notes (Signed)
History     CSN: [id number]  Arrival date & time 12/27/11  0217   First MD Initiated Contact with Patient 12/27/11 [phone]      Chief Complaint  Patient presents with  . Palpitations    (Consider location/radiation/quality/duration/timing/severity/associated sxs/prior treatment) HPI This is a 61 year old female with a history of mitral valve prolapse. She was awakened about 1:30 this morning with a rapid heartbeat. She states it felt like it was fluttering. This is accompanied by a mild vaguely characterized chest discomfort and nausea. She denies diaphoresis or dyspnea. She took an extra dose of Corgard and aspirin before coming to the ED. About 20 minutes prior to my evaluation she felt relief of her symptoms. EKG done on arrival showed atrial fibrillation with rapid ventricular response, with a pulse rate is high as 145. She was given no medications in the ED.  Past Medical History  Diagnosis Date  . Diverticulosis of colon   . Ovarian cyst   . GERD (gastroesophageal reflux disease)   . Nephrolithiasis   . Osteoarthritis   . Mitral valve prolapse   . FH: colonic polyps     adenomatous hx of   . Hiatal hernia   . Cholelithiasis   . Helicobacter pylori (H. pylori)     Past Surgical History  Procedure Date  . Kidney stone removal     x2  . Cholecystectomy     Family History  Problem Relation Age of Onset  . Cancer Father     prostate  . Cancer Paternal Aunt     breast cancer  . Diabetes Maternal Grandmother   . Heart disease Maternal Grandmother   . Heart disease Maternal Grandfather     History  Substance Use Topics  . Smoking status: Never Smoker   . Smokeless tobacco: Not on file  . Alcohol Use:     OB History    Grav Para Term Preterm Abortions TAB SAB Ect Mult Living                  Review of Systems  All other systems reviewed and are negative.    Allergies  Erythromycin and Penicillins  Home Medications   Current Outpatient Rx  Name   Route  Sig  Dispense  Refill  . ALBUTEROL SULFATE HFA 108 (90 BASE) MCG/ACT IN AERS   Inhalation   Inhale 2 puffs into the lungs every 6 (six) hours as needed. For shortness of breath or wheezing         . ASPIRIN EC 81 MG PO TBEC   Oral   Take 81 mg by mouth daily.         [name] VITAMIN D 2000 UNITS PO CAPS   Oral   Take by mouth daily.           . OMEGA-3 FATTY ACIDS 1000 MG PO CAPS   Oral   Take 1 g by mouth daily.          . GUAIFENESIN ER 600 MG PO TB12   Oral   Take 1,200 mg by mouth 2 (two) times daily.         [name] HYDROCHLOROTHIAZIDE 25 MG PO TABS   Oral   Take 25 mg by mouth daily as needed. For fluid retention         . HYDROCODONE-ACETAMINOPHEN 5-500 MG PO TABS   Oral   Take 1 tablet by mouth every 6 (six) hours as needed. For pain         .  LORATADINE 10 MG PO TABS   Oral   Take 10 mg by mouth daily as needed. For allergies         . NADOLOL 20 MG PO TABS   Oral   Take 20 mg by mouth every morning.         [name] NEXIUM 40 MG PO CPDR      TAKE ONE CAPSULE TWICE DAILY   60 capsule   2     Time for an office visit   . SUCRALFATE 1 G PO TABS   Oral   Take 1 tablet (1 g total) by mouth 2 (two) times daily.   60 tablet   1   . SUMATRIPTAN 5 MG/ACT NA SOLN   Nasal   Place 1 spray into the nose every 2 (two) hours as needed. For migraines           BP 114/89  Pulse 145  Temp 98.9 F (37.2 C)  Resp 16  SpO2 100%  Physical Exam General: Well-developed, well-nourished female in no acute distress; appearance consistent with age of record HENT: normocephalic, atraumatic Eyes: pupils equal round and reactive to light; extraocular muscles intact Neck: supple Heart: regular rate and rhythm; occasional PVCs Lungs: clear to auscultation bilaterally Abdomen: soft; nondistended Extremities: No deformity; full range of motion; pulses normal Neurologic: Awake, alert and oriented; motor function intact in all extremities and symmetric; no facial  droop Skin: Warm and dry Psychiatric: Normal mood and affect    ED Course  Procedures (including critical care time)     MDM   Nursing notes and vitals signs, including pulse oximetry, reviewed.  Summary of this visit's results, reviewed by myself:  Labs:  Results for orders placed during the hospital encounter of 12/27/11 (from the past 24 hour(s))  TROPONIN I     Status: Normal   Collection Time   12/27/11  2:22 AM      Component Value Range   Troponin I <0.30  <0.30 ng/mL  CBC WITH DIFFERENTIAL     Status: Abnormal   Collection Time   12/27/11  2:22 AM      Component Value Range   WBC 8.7  4.0 - 10.5 K/uL   RBC 4.62  3.87 - 5.11 MIL/uL   Hemoglobin 14.1  12.0 - 15.0 g/dL   HCT 16.1  09.6 - 04.5 %   MCV 84.6  78.0 - 100.0 fL   MCH 30.5  26.0 - 34.0 pg   MCHC 36.1 (*) 30.0 - 36.0 g/dL   RDW 40.9  81.1 - 91.4 %   Platelets 203  150 - 400 K/uL   Neutrophils Relative 46  43 - 77 %   Neutro Abs 4.0  1.7 - 7.7 K/uL   Lymphocytes Relative 44  12 - 46 %   Lymphs Abs 3.8  0.7 - 4.0 K/uL   Monocytes Relative 8  3 - 12 %   Monocytes Absolute 0.7  0.1 - 1.0 K/uL   Eosinophils Relative 2  0 - 5 %   Eosinophils Absolute 0.2  0.0 - 0.7 K/uL   Basophils Relative 1  0 - 1 %   Basophils Absolute 0.1  0.0 - 0.1 K/uL  COMPREHENSIVE METABOLIC PANEL     Status: Abnormal   Collection Time   12/27/11  2:22 AM      Component Value Range   Sodium 135  135 - 145 mEq/L   Potassium 3.3 (*) 3.5 - 5.1 mEq/L  Chloride 99  96 - 112 mEq/L   CO2 24  19 - 32 mEq/L   Glucose, Bld 104 (*) 70 - 99 mg/dL   BUN 19  6 - 23 mg/dL   Creatinine, Ser 1.61  0.50 - 1.10 mg/dL   Calcium 09.6  8.4 - 04.5 mg/dL   Total Protein 7.7  6.0 - 8.3 g/dL   Albumin 4.0  3.5 - 5.2 g/dL   AST 15  0 - 37 U/L   ALT 14  0 - 35 U/L   Alkaline Phosphatase 67  39 - 117 U/L   Total Bilirubin 0.8  0.3 - 1.2 mg/dL   GFR calc non Af Amer 73 (*) >90 mL/min   GFR calc Af Amer 85 (*) >90 mL/min  TROPONIN I     Status:  Normal   Collection Time   12/27/11  4:10 AM      Component Value Range   Troponin I <0.30  <0.30 ng/mL    Imaging Studies: Dg Chest Portable 1 View  12/27/2011  *RADIOLOGY REPORT*  Clinical Data: Heart palpitations  PORTABLE CHEST - 1 VIEW  Comparison: 10/26/2007  Findings: Heart size mildly enlarged.  Mediastinal contours otherwise within normal range.  Hypoaeration results in interstitial and vascular crowding.  Mild lung base atelectasis. Rounded retrocardiac density is likely superimposed artifact. Surgical clips right upper quadrant.  No definite pleural effusion or pneumothorax.  No acute osseous finding.  IMPRESSION: Hypoaeration with interstitial and vascular crowding.  Mild dependent atelectasis.  Mild cardiomegaly.  Rounded retrocardiac density is favored to be superimposed projection artifact. When the patient is clinically stable, recommend follow-up PA and lateral radiographs without overlying wires and EKG leads.   Original Report Authenticated By: Jearld Lesch, M.D.    EKG Interpretation:  Date & Time: 12/27/2011 2:26 AM  Rate: 136  Rhythm: Atrial fibrillation with rapid ventricular response; PVCs  QRS Axis: indeterminate  Intervals: normal  ST/T Wave abnormalities: nonspecific T wave changes  Conduction Disutrbances:none  Narrative Interpretation:   Old EKG Reviewed: Previous a normal sinus rhythm; nonspecific T wave changes noted previously  EKG Interpretation:  Date & Time: 12/27/2011 3:23 AM  Rate: 93  Rhythm: normal sinus rhythm and premature ventricular contractions (PVC)  QRS Axis: normal  Intervals: normal  ST/T Wave abnormalities: nonspecific T wave changes  Conduction Disutrbances:Borderline AV conduction delay  Narrative Interpretation:   Old EKG Reviewed: Previously atrial fibrillation with RVR  4:54 AM Continues to be a normal sinus rhythm. We will discharge her home.              Hanley Seamen, MD 12/27/11 [phone]

## 2011-12-30 ENCOUNTER — Telehealth: Payer: Self-pay | Admitting: Family Medicine

## 2011-12-30 NOTE — Telephone Encounter (Signed)
Referral request sent 

## 2011-12-30 NOTE — Telephone Encounter (Signed)
[facility] Triage Call Report Triage Record Num: [id number] Operator: Geanie Berlin Patient Name: Anna Marquez Call Date & Time: 12/27/2011 5:41:48PM Patient Phone: [phone] PCP: Eugenio Hoes. Todd Patient Gender: Female PCP Fax : [phone] Patient DOB: 1950/06/24 [facility] Name[facility]  Reason for Call: Caller: [name]/Other; PCP: Kelle Darting (Family Practice); CB#: [phone]; Call regarding Medication Issue; Medication(s): Corgard; Seen in [facility] ED 12/26/11 for shortness of breath with tachycardia. Diagnosed with A-fib that converted to normal sinus rhythm. ED MD advised to see cardiolgoist and speak with Dr Tawanna Cooler regarding Beta blocker dose adjustment. No current symptoms. Instructed to call office 12/30/11 when office is open for appointment for evaluation, medication discussion and referral to cardiologist for caller with non-urgent question per PCP Call -No Triage guideline.  Protocol(s) Used: PCP Calls, No Triage (Adult) Recommended Outcome per Protocol: Call Provider within 24 Hours Override Outcome if Used in Protocol: Call Provider When Office is Open RN Reason for Override Outcome: Office Is Closed. Reason for Outcome: [1] Caller requests to speak ONLY to PCP AND [2] nonurgent question

## 2012-01-09 ENCOUNTER — Encounter: Payer: Self-pay | Admitting: Family Medicine

## 2012-01-09 ENCOUNTER — Ambulatory Visit (INDEPENDENT_AMBULATORY_CARE_PROVIDER_SITE_OTHER): Payer: Managed Care, Other (non HMO) | Admitting: Family Medicine

## 2012-01-09 VITALS — BP 148/98 | HR 64 | Temp 98.0°F | Wt 170.0 lb

## 2012-01-09 DIAGNOSIS — R9389 Abnormal findings on diagnostic imaging of other specified body structures: Secondary | ICD-10-CM

## 2012-01-09 DIAGNOSIS — I059 Rheumatic mitral valve disease, unspecified: Secondary | ICD-10-CM

## 2012-01-09 DIAGNOSIS — I4819 Other persistent atrial fibrillation: Secondary | ICD-10-CM | POA: Insufficient documentation

## 2012-01-09 DIAGNOSIS — I480 Paroxysmal atrial fibrillation: Secondary | ICD-10-CM | POA: Insufficient documentation

## 2012-01-09 DIAGNOSIS — I4891 Unspecified atrial fibrillation: Secondary | ICD-10-CM

## 2012-01-09 DIAGNOSIS — Z23 Encounter for immunization: Secondary | ICD-10-CM

## 2012-01-09 DIAGNOSIS — R918 Other nonspecific abnormal finding of lung field: Secondary | ICD-10-CM

## 2012-01-09 MED ORDER — NADOLOL 40 MG PO TABS: 40.0000 mg | ORAL_TABLET | Freq: Every day | ORAL | Status: DC

## 2012-01-09 NOTE — Patient Instructions (Addendum)
Continue your current medication  If you have and another episode of rapid heart rate the which she did which is take an extra dose of beta blocker  Echocardiogram  Cardiac evaluation as outlined

## 2012-01-09 NOTE — Progress Notes (Signed)
  Subjective:    Patient ID: Anna Marquez, female    DOB: 24-Jan-1950, 62 y.o.   MRN: [id number]  HPI Anna Marquez is a delightful 62 year old female nonsmoker who comes in today for evaluation and followup of rapid heart rate  On December 20 she awoke at 1:30 AM with rapid heart rate. She took an extra 20 mg of her Corgard and 2 baby aspirin. The rapid heart rate persisted therefore she went to the emergency room. In the emergency room her initial EKG showed A. fib with a rate of about 150. This was at 2:26 AM. At 3:23 AM she spontaneously converted back into sinus rhythm. Therefore no medication was given and she was discharged to be followed as an outpatient.  She has a history of mitral valve prolapse and occasional episodes of skipping but she's never had A. fib before.  We did an echocardiogram about 20 years ago when she initially had the Mvp........ and it showed mitral valve regurg otherwise within normal limits. She's been asymptomatic since that time.   Review of Systems General and cardiac review of systems otherwise negative except she took an over-the-counter pseudoephedrine product prior to the episode however she states that she stopped it 3 days prior to the A. fib episode    Objective:   Physical Exam   well-developed well-nourished female no acute distress lungs are clear to auscultation cardiac exam normal I cannot even appreciate a murmur of MVP today. BP at home 120/80      Assessment & Plan:  History of MVP  Recent history of A. fib converted spontaneously plan echocardiogram cardiac followup on the 20th with Dr. Jamse Mead.

## 2012-01-09 NOTE — Addendum Note (Signed)
Addended by: Kern Reap B on: 01/09/2012 02:31 PM   Modules accepted: Orders

## 2012-01-16 ENCOUNTER — Ambulatory Visit (INDEPENDENT_AMBULATORY_CARE_PROVIDER_SITE_OTHER)
Admission: RE | Admit: 2012-01-16 | Discharge: 2012-01-16 | Disposition: A | Discharge status: Home / Self Care | Payer: BC Managed Care – PPO | Source: Ambulatory Visit | Attending: Family Medicine | Admitting: Family Medicine

## 2012-01-16 ENCOUNTER — Ambulatory Visit (HOSPITAL_COMMUNITY): Payer: BC Managed Care – PPO | Attending: Cardiology | Admitting: Radiology

## 2012-01-16 DIAGNOSIS — I379 Nonrheumatic pulmonary valve disorder, unspecified: Secondary | ICD-10-CM | POA: Insufficient documentation

## 2012-01-16 DIAGNOSIS — R9389 Abnormal findings on diagnostic imaging of other specified body structures: Secondary | ICD-10-CM

## 2012-01-16 DIAGNOSIS — R918 Other nonspecific abnormal finding of lung field: Secondary | ICD-10-CM

## 2012-01-16 DIAGNOSIS — I4891 Unspecified atrial fibrillation: Secondary | ICD-10-CM

## 2012-01-16 DIAGNOSIS — I059 Rheumatic mitral valve disease, unspecified: Secondary | ICD-10-CM | POA: Insufficient documentation

## 2012-01-16 DIAGNOSIS — I369 Nonrheumatic tricuspid valve disorder, unspecified: Secondary | ICD-10-CM | POA: Insufficient documentation

## 2012-01-16 NOTE — Progress Notes (Signed)
Echocardiogram performed.  

## 2012-01-27 ENCOUNTER — Encounter: Payer: Self-pay | Admitting: Cardiovascular Disease

## 2012-01-27 ENCOUNTER — Ambulatory Visit (INDEPENDENT_AMBULATORY_CARE_PROVIDER_SITE_OTHER): Payer: BC Managed Care – PPO | Admitting: Cardiovascular Disease

## 2012-01-27 VITALS — BP 142/90 | HR 73 | Resp 18 | Ht 64.0 in | Wt 169.0 lb

## 2012-01-27 DIAGNOSIS — I059 Rheumatic mitral valve disease, unspecified: Secondary | ICD-10-CM

## 2012-01-27 DIAGNOSIS — I10 Essential (primary) hypertension: Secondary | ICD-10-CM | POA: Insufficient documentation

## 2012-01-27 DIAGNOSIS — I4891 Unspecified atrial fibrillation: Secondary | ICD-10-CM

## 2012-01-27 DIAGNOSIS — Z8679 Personal history of other diseases of the circulatory system: Secondary | ICD-10-CM

## 2012-01-27 MED ORDER — LISINOPRIL 10 MG PO TABS: 10.0000 mg | ORAL_TABLET | Freq: Every day | ORAL | Status: DC

## 2012-01-27 NOTE — Patient Instructions (Addendum)
Your physician has recommended you make the following change in your medication:   START LISINOPRIL 10 MG DAILY  Your physician recommends that you schedule a follow-up appointment in: 3 MONTHS WITH BMET  DASH Diet The DASH diet stands for "Dietary Approaches to Stop Hypertension." It is a healthy eating plan that has been shown to reduce high blood pressure (hypertension) in as little as 14 days, while also possibly providing other significant health benefits. These other health benefits include reducing the risk of breast cancer after menopause and reducing the risk of type 2 diabetes, heart disease, colon cancer, and stroke. Health benefits also include weight loss and slowing kidney failure in patients with chronic kidney disease.  DIET GUIDELINES  Limit salt (sodium). Your diet should contain less than 1500 mg of sodium daily.  Limit refined or processed carbohydrates. Your diet should include mostly whole grains. Desserts and added sugars should be used sparingly.  Include small amounts of heart-healthy fats. These types of fats include nuts, oils, and tub margarine. Limit saturated and trans fats. These fats have been shown to be harmful in the body. CHOOSING FOODS  The following food groups are based on a 2000 calorie diet. See your Registered Dietitian for individual calorie needs. Grains and Grain Products (6 to 8 servings daily)  Eat More Often: Whole-wheat bread, brown rice, whole-grain or wheat pasta, quinoa, popcorn without added fat or salt (air popped).  Eat Less Often: White bread, white pasta, white rice, cornbread. Vegetables (4 to 5 servings daily)  Eat More Often: Fresh, frozen, and canned vegetables. Vegetables may be raw, steamed, roasted, or grilled with a minimal amount of fat.  Eat Less Often/Avoid: Creamed or fried vegetables. Vegetables in a cheese sauce. Fruit (4 to 5 servings daily)  Eat More Often: All fresh, canned (in natural juice), or frozen fruits.  Dried fruits without added sugar. One hundred percent fruit juice ( cup [237 mL] daily).  Eat Less Often: Dried fruits with added sugar. Canned fruit in light or heavy syrup. [employer], Fish, and Poultry (2 servings or less daily. One serving is 3 to 4 oz [85-114 g]).  Eat More Often: Ninety percent or leaner ground beef, tenderloin, sirloin. Round cuts of beef, chicken breast, [place] breast. All fish. Grill, bake, or broil your meat. Nothing should be fried.  Eat Less Often/Avoid: Fatty cuts of meat, [place], or chicken leg, thigh, or wing. Fried cuts of meat or fish. Dairy (2 to 3 servings)  Eat More Often: Low-fat or fat-free milk, low-fat plain or light yogurt, reduced-fat or part-skim cheese.  Eat Less Often/Avoid: Milk (whole, 2%).Whole milk yogurt. Full-fat cheeses. Nuts, Seeds, and Legumes (4 to 5 servings per week)  Eat More Often: All without added salt.  Eat Less Often/Avoid: Salted nuts and seeds, canned beans with added salt. Fats and Sweets (limited)  Eat More Often: Vegetable oils, tub margarines without trans fats, sugar-free gelatin. Mayonnaise and salad dressings.  Eat Less Often/Avoid: Coconut oils, palm oils, butter, stick margarine, cream, half and half, cookies, candy, pie. FOR MORE INFORMATION The Dash Diet Eating Plan: www.dashdiet.org Document Released: 12/13/2010 Document Revised: 03/18/2011 Document Reviewed: 12/13/2010 [facility] Patient Information 2013 [place], [place].

## 2012-01-27 NOTE — Assessment & Plan Note (Signed)
Her blood pressure has been elevated for the past several months. She is on hydrochlorothiazide. We'll add lisinopril 10 mg a day to her medical regimen. I'll see her again in 3 months for followup office visit and basic metabolic profile.

## 2012-01-27 NOTE — Progress Notes (Signed)
Anna Marquez Date of Birth  October 05, 1950       [facility] Office [address], [address]  [address] [place], [place]  [address]   [place], [place]  [address] [phone]     [phone]   Fax  [phone]    Fax [phone]  Problem List: 1. Paroxysmal atrial fibrillation 2. Hypertension 3.  Mitral Valve prolapse 4. Asthma 5, GERD   History of Present Illness:  Anna Marquez is a 62 year old female with a history of mitral valve prolapse and hypertension in the past. She recently presented to the hospital with palpitations.  She woke up with palpitations.  She drinks alcohol only very rarely.  She was found to have atrial fibrillation with a rapid ventricular response. She taken an extra Corgard prior to coming to the emergency room. She converted to sinus rhythm while in the emergency room. Followup EKG revealed normal sinus rhythm.  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 She tries to exercise on a daily basis.  She has had occasional palpitations that sound like PVCs .    Current Outpatient Prescriptions on File Prior to Visit  Medication Sig Dispense Refill  . albuterol (PROVENTIL HFA;VENTOLIN HFA) 108 (90 BASE) MCG/ACT inhaler Inhale 2 puffs into the lungs every 6 (six) hours as needed. For shortness of breath or wheezing      . aspirin EC 81 MG tablet Take 81 mg by mouth daily.      . Cholecalciferol (VITAMIN D) 2000 UNITS CAPS Take by mouth daily.        . fish oil-omega-3 fatty acids 1000 MG capsule Take 1 g by mouth daily.       . hydrochlorothiazide (HYDRODIURIL) 25 MG tablet Take 25 mg by mouth daily as needed. For fluid retention      . HYDROcodone-acetaminophen (VICODIN) 5-500 MG per tablet Take 1 tablet by mouth every 6 (six) hours as needed. For pain      . loratadine (CLARITIN) 10 MG tablet Take 10 mg by mouth daily as needed. For allergies      . nadolol (CORGARD) 40 MG tablet Take  1 tablet (40 mg total) by mouth daily.  100 tablet  3  . NEXIUM 40 MG capsule TAKE ONE CAPSULE TWICE DAILY  60 capsule  2  . sucralfate (CARAFATE) 1 G tablet Take 1 tablet (1 g total) by mouth 2 (two) times daily.  60 tablet  1  . SUMAtriptan (IMITREX) 5 MG/ACT nasal spray Place 1 spray into the nose every 2 (two) hours as needed. For migraines      . guaiFENesin (MUCINEX) 600 MG 12 hr tablet Take 1,200 mg by mouth 2 (two) times daily.        Allergies  Allergen Reactions  . Erythromycin Nausea And Vomiting    REACTION: Stomach upset  . Penicillins Hives    Past Medical History  Diagnosis Date  . Diverticulosis of colon   . Ovarian cyst   . GERD (gastroesophageal reflux disease)   . Nephrolithiasis   . Osteoarthritis   . Mitral valve prolapse   . FH: colonic polyps     adenomatous hx of   . Hiatal hernia   . Cholelithiasis   . Helicobacter pylori (H. pylori)     Past Surgical History  Procedure Date  . Kidney stone removal     x2  . Cholecystectomy  History  Smoking status  . Never Smoker   Smokeless tobacco  . Not on file    History  Alcohol Use     Family History  Problem Relation Age of Onset  . Cancer Father     prostate  . Cancer Paternal Aunt     breast cancer  . Diabetes Maternal Grandmother   . Heart disease Maternal Grandmother   . Heart disease Maternal Grandfather     Reviw of Systems:  Reviewed in the HPI.  All other systems are negative.  Physical Exam: Blood pressure 142/90, pulse 73, resp. rate 18, height 5\' 4"  (1.626 m), weight 169 lb (76.658 kg), SpO2 98.00%. General: Well developed, well nourished, in no acute distress.  Head: Normocephalic, atraumatic, sclera non-icteric, mucus membranes are moist,   Neck: Supple. Carotids are 2 + without bruits. No JVD   Lungs: Clear   Heart: RR, soft systolic murmur  Abdomen: Soft, non-tender, non-distended with normal bowel sounds.  Msk:  Strength and tone are normal   Extremities:  No clubbing or cyanosis. No edema.  Distal pedal pulses are 2+ and equal    Neuro: CN II - XII intact.  Alert and oriented X 3.   Psych:  Normal   ECG: January 27, 2012: Normal sinus rhythm at 73 beats a minute. She is non-specific T wave flattening in leads V4 through V6   Assessment / Plan:

## 2012-01-27 NOTE — Assessment & Plan Note (Signed)
Anna Marquez had a brief episode of atrial fibrillation that had resolved by the time she reached the emergency room. She does have a history of hypertension. At this point I don't think that she's had much Atrial  fibrillation to the point where she would need anticoagulation. We'll continue to monitor her closely. She cannot identify any specific triggers to her atrial fibrillation.  We will increase her aspirin from 81 mg up to 325 mg a day.

## 2012-01-27 NOTE — Assessment & Plan Note (Signed)
Her recent echocardiogram reveals that she has bowing of her mitral valve leaflet but without any true actual prolapse. She does have moderate mitral regurgitation.

## 2012-01-30 ENCOUNTER — Other Ambulatory Visit: Payer: Self-pay | Admitting: Family Medicine

## 2012-02-11 ENCOUNTER — Other Ambulatory Visit (INDEPENDENT_AMBULATORY_CARE_PROVIDER_SITE_OTHER): Payer: BC Managed Care – PPO

## 2012-02-11 DIAGNOSIS — Z0000 Encounter for general adult medical examination without abnormal findings: Secondary | ICD-10-CM

## 2012-02-11 LAB — CBC WITH DIFFERENTIAL/PLATELET
Basophils Relative: 0.7 % (ref 0.0–3.0)
Eosinophils Relative: 3.2 % (ref 0.0–5.0)
Lymphocytes Relative: 28.8 % (ref 12.0–46.0)
MCV: 90.2 fl (ref 78.0–100.0)
Monocytes Relative: 6 % (ref 3.0–12.0)
Neutrophils Relative %: 61.3 % (ref 43.0–77.0)
RBC: 4.43 Mil/uL (ref 3.87–5.11)
WBC: 7 10*3/uL (ref 4.5–10.5)

## 2012-02-11 LAB — POCT URINALYSIS DIPSTICK
Bilirubin, UA: NEGATIVE
Ketones, UA: NEGATIVE
Protein, UA: NEGATIVE
Spec Grav, UA: 1.01
pH, UA: 5.5

## 2012-02-11 LAB — BASIC METABOLIC PANEL
Calcium: 9.7 mg/dL (ref 8.4–10.5)
Chloride: 106 mEq/L (ref 96–112)
Creatinine, Ser: 0.8 mg/dL (ref 0.4–1.2)
GFR: 76.99 mL/min (ref >60.00)

## 2012-02-11 LAB — HEPATIC FUNCTION PANEL
ALT: 16 U/L (ref 0–35)
Alkaline Phosphatase: 50 U/L (ref 39–117)
Bilirubin, Direct: 0.1 mg/dL (ref 0.0–0.3)
Total Protein: 7.6 g/dL (ref 6.0–8.3)

## 2012-02-11 LAB — LIPID PANEL
Cholesterol: 199 mg/dL (ref 0–200)
HDL: 47.9 mg/dL (ref >39.00)
Total CHOL/HDL Ratio: 4
VLDL: 43.4 mg/dL — ABNORMAL HIGH (ref 0.0–40.0)

## 2012-02-18 ENCOUNTER — Encounter: Payer: Self-pay | Admitting: Family Medicine

## 2012-02-18 ENCOUNTER — Other Ambulatory Visit (HOSPITAL_COMMUNITY)
Admission: RE | Admit: 2012-02-18 | Discharge: 2012-02-18 | Disposition: A | Discharge status: Home / Self Care | Payer: BC Managed Care – PPO | Source: Ambulatory Visit | Attending: Family Medicine | Admitting: Family Medicine

## 2012-02-18 ENCOUNTER — Ambulatory Visit (INDEPENDENT_AMBULATORY_CARE_PROVIDER_SITE_OTHER): Payer: BC Managed Care – PPO | Admitting: Family Medicine

## 2012-02-18 VITALS — BP 160/110 | Temp 98.1°F | Ht 64.5 in | Wt 170.0 lb

## 2012-02-18 DIAGNOSIS — G43009 Migraine without aura, not intractable, without status migrainosus: Secondary | ICD-10-CM

## 2012-02-18 DIAGNOSIS — I059 Rheumatic mitral valve disease, unspecified: Secondary | ICD-10-CM

## 2012-02-18 DIAGNOSIS — K449 Diaphragmatic hernia without obstruction or gangrene: Secondary | ICD-10-CM

## 2012-02-18 DIAGNOSIS — M1990 Unspecified osteoarthritis, unspecified site: Secondary | ICD-10-CM

## 2012-02-18 DIAGNOSIS — I10 Essential (primary) hypertension: Secondary | ICD-10-CM

## 2012-02-18 DIAGNOSIS — Z860100 Personal history of colon polyps, unspecified: Secondary | ICD-10-CM

## 2012-02-18 DIAGNOSIS — K219 Gastro-esophageal reflux disease without esophagitis: Secondary | ICD-10-CM

## 2012-02-18 DIAGNOSIS — J309 Allergic rhinitis, unspecified: Secondary | ICD-10-CM

## 2012-02-18 DIAGNOSIS — R946 Abnormal results of thyroid function studies: Secondary | ICD-10-CM

## 2012-02-18 DIAGNOSIS — K5730 Diverticulosis of large intestine without perforation or abscess without bleeding: Secondary | ICD-10-CM

## 2012-02-18 DIAGNOSIS — Z01419 Encounter for gynecological examination (general) (routine) without abnormal findings: Secondary | ICD-10-CM | POA: Insufficient documentation

## 2012-02-18 DIAGNOSIS — I4891 Unspecified atrial fibrillation: Secondary | ICD-10-CM

## 2012-02-18 DIAGNOSIS — R319 Hematuria, unspecified: Secondary | ICD-10-CM

## 2012-02-18 DIAGNOSIS — I340 Nonrheumatic mitral (valve) insufficiency: Secondary | ICD-10-CM

## 2012-02-18 DIAGNOSIS — D235 Other benign neoplasm of skin of trunk: Secondary | ICD-10-CM

## 2012-02-18 LAB — POCT URINALYSIS DIPSTICK
Bilirubin, UA: NEGATIVE
Ketones, UA: NEGATIVE
Leukocytes, UA: NEGATIVE
pH, UA: 6

## 2012-02-18 MED ORDER — ESOMEPRAZOLE MAGNESIUM 40 MG PO CPDR: DELAYED_RELEASE_CAPSULE | ORAL | Status: DC

## 2012-02-18 MED ORDER — NADOLOL 40 MG PO TABS: 40.0000 mg | ORAL_TABLET | Freq: Every day | ORAL | Status: DC

## 2012-02-18 MED ORDER — ESTROGENS, CONJUGATED 0.625 MG/GM VA CREA: 1.0000 g | TOPICAL_CREAM | Freq: Every day | VAGINAL | Status: DC

## 2012-02-18 MED ORDER — HYDROCODONE-ACETAMINOPHEN 5-500 MG PO TABS: 1.0000 | ORAL_TABLET | Freq: Four times a day (QID) | ORAL | Status: DC | PRN

## 2012-02-18 MED ORDER — LISINOPRIL 10 MG PO TABS: 10.0000 mg | ORAL_TABLET | Freq: Every day | ORAL | Status: DC

## 2012-02-18 MED ORDER — HYDROCHLOROTHIAZIDE 25 MG PO TABS: 25.0000 mg | ORAL_TABLET | Freq: Every day | ORAL | Status: DC | PRN

## 2012-02-18 MED ORDER — SUCRALFATE 1 G PO TABS: 1.0000 g | ORAL_TABLET | Freq: Two times a day (BID) | ORAL | Status: DC

## 2012-02-18 MED ORDER — SUMATRIPTAN 5 MG/ACT NA SOLN: 1.0000 | NASAL | Status: DC | PRN

## 2012-02-18 NOTE — Patient Instructions (Signed)
If you have any another episode of spontaneous bleeding call and come in and let us check you immediately  Continue your current medications  The arthritic nodule at the junction of the clavicle and the sternum I would observe..........Anna Marquez If it does increase in size let us know  Remember to do a thorough breast and skin exam monthly  Return in one year for general physical examination sooner if any problems  Do the Kegel exercises 50 twice daily

## 2012-02-18 NOTE — Progress Notes (Signed)
Subjective:    Patient ID: Anna Marquez, female    DOB: July 05, 1950, 62 y.o.   MRN: [id number]  HPI Hollis is a 62 year old female nonsmoker who comes in today for general physical examination because of a history of migraine headaches, hiatal hernia with reflux esophagitis, mitral valve prolapse with new onset of atrial fib now asymptomatic back in sinus rhythm, allergic rhinitis, occasional asthma triggered by a viral syndrome.  We sent her to cardiology because of that new onset A. Fib. Workup was negative she's been on adult aspirin daily and in sinus rhythm. They added 10 mg of lisinopril because her blood pressure was elevated BP at home 130/84 low is 119/82  Her echo showed no prolapse but she does have a floppy valve.  She has a lesion on her right clavicle where it meets the sternum she's felt a knot there for couple weeks. The day after she had her lab work she had the sudden sun spelled of spontaneous hematuria that lasted 2 days and went away. She had some vague low back pain but no symptoms of a kidney stone. Her last kidney stone was 5 years ago  She gets routine eye care, dental care, BSE monthly, and you mammography, colonoscopy with Dr. Dickie La normal,   Review of Systems  Constitutional: Negative.   HENT: Negative.   Eyes: Negative.   Respiratory: Negative.   Cardiovascular: Negative.   Gastrointestinal: Negative.   Genitourinary: Negative.   Musculoskeletal: Negative.   Neurological: Negative.   Psychiatric/Behavioral: Negative.        Objective:   Physical Exam  Constitutional: She appears well-developed and well-nourished.  HENT:  Head: Normocephalic and atraumatic.  Right Ear: External ear normal.  Left Ear: External ear normal.  Nose: Nose normal.  Mouth/Throat: Oropharynx is clear and moist.  Eyes: EOM are normal. Pupils are equal, round, and reactive to light.  Neck: Normal range of motion. Neck supple. No thyromegaly present.  Cardiovascular: Normal  rate, regular rhythm, normal heart sounds and intact distal pulses.  Exam reveals no gallop and no friction rub.   No murmur heard. Pulmonary/Chest: Effort normal and breath sounds normal.  Not at the junction of the sternum and the clavicle  Abdominal: Soft. Bowel sounds are normal. She exhibits no distension and no mass. There is no tenderness. There is no rebound.  Genitourinary: Vagina normal and uterus normal. Guaiac negative stool. No vaginal discharge found.  Prolapsed bladder  Bilateral breast exam normal  Musculoskeletal: Normal range of motion.  Lymphadenopathy:    She has no cervical adenopathy.  Neurological: She is alert. She has normal reflexes. No cranial nerve deficit. She exhibits normal muscle tone. Coordination normal.  Skin: Skin is warm and dry.  Total body skin exam normal  Psychiatric: She has a normal mood and affect. Her behavior is normal. Judgment and thought content normal.          Assessment & Plan:  Healthy female  Hypertension at goal with normal blood pressures at home BP here 160/110,,,,,,,, go with home blood pressure readings  Migraine headaches continue current medication  Reflux esophagitis without a hernia continue current medication  Episode of atrial fib now in sinus rhythm with normal echocardiogram continue aspirin one daily and observe  Arthritic nodule at the junction of the clavicle and the sternum observe  Hematuria recheck urine if it does recur then will have a urologic evaluation  Chronic sun damage continue sunscreens and careful skin exams and breast exams monthly  Prolapsed bladder

## 2012-04-10 ENCOUNTER — Other Ambulatory Visit: Payer: Self-pay | Admitting: Family Medicine

## 2012-04-24 ENCOUNTER — Encounter: Payer: Self-pay | Admitting: Internal Medicine

## 2012-04-24 ENCOUNTER — Ambulatory Visit (INDEPENDENT_AMBULATORY_CARE_PROVIDER_SITE_OTHER): Payer: BC Managed Care – PPO | Admitting: Internal Medicine

## 2012-04-24 VITALS — BP 120/86 | HR 76 | Temp 98.1°F | Resp 16 | Wt 168.2 lb

## 2012-04-24 DIAGNOSIS — J45901 Unspecified asthma with (acute) exacerbation: Secondary | ICD-10-CM

## 2012-04-24 DIAGNOSIS — I4891 Unspecified atrial fibrillation: Secondary | ICD-10-CM

## 2012-04-24 DIAGNOSIS — J309 Allergic rhinitis, unspecified: Secondary | ICD-10-CM

## 2012-04-24 MED ORDER — PREDNISONE (PAK) 10 MG PO TABS: 10.0000 mg | ORAL_TABLET | ORAL | Status: DC

## 2012-04-24 MED ORDER — ALBUTEROL SULFATE HFA 108 (90 BASE) MCG/ACT IN AERS: 2.0000 | INHALATION_SPRAY | Freq: Four times a day (QID) | RESPIRATORY_TRACT | Status: DC | PRN

## 2012-04-24 MED ORDER — AZITHROMYCIN 250 MG PO TABS: ORAL_TABLET | ORAL | Status: DC

## 2012-04-24 MED ORDER — HYDROCODONE-HOMATROPINE 5-1.5 MG/5ML PO SYRP: 5.0000 mL | ORAL_SOLUTION | Freq: Four times a day (QID) | ORAL | Status: DC | PRN

## 2012-04-24 NOTE — Progress Notes (Signed)
  Subjective:    HPI  complains of cough and allergy symptoms  Onset >2 weeks ago, progressively worse   Associated with rhinorrhea, sneezing, sore throat, mild headache and  wheezing Also productive cough with mod chest congestion, symptoms worse at night >yellow green sputum/DC No relief with OTC or usual rx meds Precipitated by weather change and ?sick contacts  Past Medical History  Diagnosis Date  . Diverticulosis of colon   . Ovarian cyst   . GERD (gastroesophageal reflux disease)   . Nephrolithiasis   . Osteoarthritis   . Mitral valve prolapse   . FH: colonic polyps     adenomatous hx of   . Hiatal hernia   . Cholelithiasis   . Helicobacter pylori (H. pylori)     Review of Systems Constitutional: No unexpected weight change Pulmonary: No pleurisy or hemoptysis Cardiovascular: No chest pain or palpitations     Objective:   Physical Exam BP 120/86  Pulse 76  Temp(Src) 98.1 F (36.7 C) (Oral)  Resp 16  Wt 168 lb 4 oz (76.318 kg)  BMI 28.44 kg/m2  SpO2 97% GEN: mildly ill appearing and audible head/chest congestion HENT: NCAT, mild sinus tenderness bilaterally, nares with clear discharge, oropharynx mild erythema, no exudate Eyes: Vision grossly intact, no conjunctivitis Lungs: scattered bilateral rhonchi, mild occ exp wheeze, no increased work of breathing Cardiovascular: Regular rate and rhythm, no bilateral edema  Lab Results  Component Value Date   WBC 7.0 02/11/2012   HGB 13.4 02/11/2012   HCT 40.0 02/11/2012   PLT 187.0 02/11/2012   GLUCOSE 94 02/11/2012   CHOL 199 02/11/2012   TRIG 217.0* 02/11/2012   HDL 47.90 02/11/2012   LDLDIRECT 105.7 02/11/2012   LDLCALC 111* 10/30/2010   ALT 16 02/11/2012   AST 18 02/11/2012   NA 140 02/11/2012   K 4.1 02/11/2012   CL 106 02/11/2012   CREATININE 0.8 02/11/2012   BUN 13 02/11/2012   CO2 27 02/11/2012   TSH 0.15* 02/11/2012       Assessment & Plan:   asthmatic bronchitis, seasonal flare with hx same each spring allergic rhinitis   Cough, postnasal drip related to above AF - began ACEI 01/2012 and due for cards follow up soon    Empiric antibiotics prescribed due to symptom duration greater than 7 days and comorbid disease Pred taper x 6 days and renew Alb MDI Prescription cough suppression - new prescriptions done Continue Claritin and Flonase daily as ongoing prior to seasonal flare Discussed possibility of ACE inhibitor causing dry cough symptoms if symptoms unimproved with treatment for asthma and allergies, patient will followup with cardiology or primary care on same if persisting concern Symptomatic care with Tylenol or Advil, hydration and rest -

## 2012-04-24 NOTE — Patient Instructions (Signed)
It was good to see you today. Zpak antibiotics, prednisone taper for 6 days and prescription cough syrup - Your prescription(s) have been submitted to your pharmacy. Please take as directed and contact our office if you believe you are having problem(s) with the medication(s). Renew inhaler as discussed Alternate between ibuprofen and tylenol for aches, pain and fever symptoms as discussed If persisting cough symptoms despite treatment for asthma and allergies, discuss with your cardiologist possibility of lisinopril side effect causing cough Hydrate, rest and call if worse or unimproved

## 2012-04-27 ENCOUNTER — Encounter: Payer: Self-pay | Admitting: *Deleted

## 2012-04-27 ENCOUNTER — Encounter: Payer: Self-pay | Admitting: Cardiovascular Disease

## 2012-04-27 ENCOUNTER — Ambulatory Visit (INDEPENDENT_AMBULATORY_CARE_PROVIDER_SITE_OTHER): Payer: BC Managed Care – PPO | Admitting: Cardiovascular Disease

## 2012-04-27 VITALS — BP 134/90 | HR 75 | Ht 64.5 in | Wt 168.8 lb

## 2012-04-27 DIAGNOSIS — I4891 Unspecified atrial fibrillation: Secondary | ICD-10-CM

## 2012-04-27 DIAGNOSIS — I10 Essential (primary) hypertension: Secondary | ICD-10-CM

## 2012-04-27 NOTE — Progress Notes (Signed)
Anna Marquez Date of Birth  23-Apr-1950       [facility]    [place] [address]  [address] [place], [place]  [address]   [place], [place]  [address] [phone]     [phone]   Fax  [phone]    Fax [phone]  Problem List: 1. Paroxysmal atrial fibrillation 2. Hypertension 3.  Mitral Valve prolapse 4. Asthma 5, GERD   History of Present Illness:  Anna Marquez is a 62 year old female with a history of mitral valve prolapse and hypertension in the past. She recently presented to the hospital with palpitations.  She woke up with palpitations.  She drinks alcohol only very rarely.  She was found to have atrial fibrillation with a rapid ventricular response. She taken an extra Corgard prior to coming to the emergency room. She converted to sinus rhythm while in the emergency room. Followup EKG revealed normal sinus rhythm.  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 She tries to exercise on a daily basis.  She has had occasional palpitations that sound like PVCs .    April 27, 2012:  [name] is doing well.  No palpitations.  Exercising regularly.  No complaints   Current Outpatient Prescriptions on File Prior to Visit  Medication Sig Dispense Refill  . albuterol (PROVENTIL HFA;VENTOLIN HFA) 108 (90 BASE) MCG/ACT inhaler Inhale 2 puffs into the lungs every 6 (six) hours as needed. For shortness of breath or wheezing  1 Inhaler  3  . aspirin 325 MG tablet Take 325 mg by mouth daily.      [name] azithromycin (ZITHROMAX Z-PAK) 250 MG tablet Take 2 tablets (500 mg) on  Day 1,  followed by 1 tablet (250 mg) once daily on Days 2 through 5.  6 each  0  . Cholecalciferol (VITAMIN D) 2000 UNITS CAPS Take by mouth daily.        [name] conjugated estrogens (PREMARIN) vaginal cream Place 1 g vaginally 2 (two) times a week.      . esomeprazole (NEXIUM) 40 MG capsule TAKE ONE CAPSULE TWICE DAILY  180 capsule  3  .  fish oil-omega-3 fatty acids 1000 MG capsule Take 1 g by mouth daily.       . fluticasone (FLONASE) 50 MCG/ACT nasal spray USE ONE SPRAY IN EACH NOSTRIL DAILY.  16 g  10  . hydrochlorothiazide (HYDRODIURIL) 25 MG tablet Take 1 tablet (25 mg total) by mouth daily as needed. For fluid retention  100 tablet  3  . HYDROcodone-acetaminophen (VICODIN) 5-500 MG per tablet Take 1 tablet by mouth every 6 (six) hours as needed. For pain  30 tablet  1  . HYDROcodone-homatropine (HYDROMET) 5-1.5 MG/5ML syrup Take 5 mLs by mouth every 6 (six) hours as needed for cough.  120 mL  0  . lisinopril (PRINIVIL,ZESTRIL) 10 MG tablet Take 1 tablet (10 mg total) by mouth daily.  100 tablet  5  . loratadine (CLARITIN) 10 MG tablet Take 10 mg by mouth daily as needed. For allergies      . nadolol (CORGARD) 40 MG tablet Take 1 tablet (40 mg total) by mouth daily.  100 tablet  3  . predniSONE (STERAPRED UNI-PAK) 10 MG tablet Take 1 tablet (10 mg total) by mouth as directed. As directed x 6 days  21 tablet  0  . sucralfate (CARAFATE) 1 G tablet Take 1 tablet (1 g total) by  mouth 2 (two) times daily.  60 tablet  1  . SUMAtriptan (IMITREX) 5 MG/ACT nasal spray Place 1 spray (5 mg total) into the nose every 2 (two) hours as needed. For migraines  3 Inhaler  10  . vitamin E 400 UNIT capsule Take 400 Units by mouth daily.       No current facility-administered medications on file prior to visit.    Allergies  Allergen Reactions  . Erythromycin Nausea And Vomiting    REACTION: Stomach upset  . Penicillins Hives    Past Medical History  Diagnosis Date  . Diverticulosis of colon   . Ovarian cyst   . GERD (gastroesophageal reflux disease)   . Nephrolithiasis   . Osteoarthritis   . Mitral valve prolapse   . FH: colonic polyps     adenomatous hx of   . Hiatal hernia   . Cholelithiasis   . Helicobacter pylori (H. pylori)     Past Surgical History  Procedure Laterality Date  . Kidney stone removal      x2  .  Cholecystectomy      History  Smoking status  . Never Smoker   Smokeless tobacco  . Not on file    History  Alcohol Use     Family History  Problem Relation Age of Onset  . Cancer Father     prostate  . Cancer Paternal Aunt     breast cancer  . Diabetes Maternal Grandmother   . Heart disease Maternal Grandmother   . Heart disease Maternal Grandfather     Reviw of Systems:  Reviewed in the HPI.  All other systems are negative.  Physical Exam: Blood pressure 134/90, pulse 75, height 5' 4.5" (1.638 m), weight 168 lb 12.8 oz (76.567 kg), SpO2 95.00%. General: Well developed, well nourished, in no acute distress.  Head: Normocephalic, atraumatic, sclera non-icteric, mucus membranes are moist,   Neck: Supple. Carotids are 2 + without bruits. No JVD   Lungs: Clear   Heart: RR, soft systolic murmur  Abdomen: Soft, non-tender, non-distended with normal bowel sounds.  Msk:  Strength and tone are normal   Extremities: No clubbing or cyanosis. No edema.  Distal pedal pulses are 2+ and equal    Neuro: CN II - XII intact.  Alert and oriented X 3.   Psych:  Normal   ECG: January 27, 2012: Normal sinus rhythm at 73 beats a minute. She is non-specific T wave flattening in leads V4 through V6   Assessment / Plan:

## 2012-04-27 NOTE — Patient Instructions (Addendum)
Your physician wants you to follow-up in: 1 year with ekg You will receive a reminder letter in the mail two months in advance. If you don't receive a letter, please call our office to schedule the follow-up appointment.  Your physician recommends that you continue on your current medications as directed. Please refer to the Current Medication list given to you today. 

## 2012-04-27 NOTE — Assessment & Plan Note (Signed)
Stable

## 2012-04-27 NOTE — Assessment & Plan Note (Signed)
Stable She is fatigued but is tolerating the lisinopril well.

## 2012-05-05 ENCOUNTER — Other Ambulatory Visit (INDEPENDENT_AMBULATORY_CARE_PROVIDER_SITE_OTHER): Payer: BC Managed Care – PPO

## 2012-05-05 DIAGNOSIS — R946 Abnormal results of thyroid function studies: Secondary | ICD-10-CM

## 2012-05-05 LAB — TSH: TSH: 0.15 u[IU]/mL — ABNORMAL LOW (ref 0.35–5.50)

## 2012-05-05 LAB — T3, FREE: T3, Free: 2.9 pg/mL (ref 2.3–4.2)

## 2012-05-05 LAB — T4, FREE: Free T4: 1 ng/dL (ref 0.60–1.60)

## 2012-05-15 ENCOUNTER — Other Ambulatory Visit: Payer: BC Managed Care – PPO

## 2012-07-24 ENCOUNTER — Other Ambulatory Visit: Payer: Self-pay

## 2012-07-24 DIAGNOSIS — K219 Gastro-esophageal reflux disease without esophagitis: Secondary | ICD-10-CM

## 2012-07-24 DIAGNOSIS — I4891 Unspecified atrial fibrillation: Secondary | ICD-10-CM

## 2012-07-24 DIAGNOSIS — I059 Rheumatic mitral valve disease, unspecified: Secondary | ICD-10-CM

## 2012-07-24 MED ORDER — LISINOPRIL 10 MG PO TABS: 10.0000 mg | ORAL_TABLET | Freq: Every day | ORAL | Status: DC

## 2012-07-24 MED ORDER — ESOMEPRAZOLE MAGNESIUM 40 MG PO CPDR: DELAYED_RELEASE_CAPSULE | ORAL | Status: DC

## 2012-07-24 MED ORDER — NADOLOL 40 MG PO TABS: 40.0000 mg | ORAL_TABLET | Freq: Every day | ORAL | Status: DC

## 2012-07-24 NOTE — Telephone Encounter (Signed)
Prescriptions sent to [facility].

## 2012-07-27 ENCOUNTER — Other Ambulatory Visit: Payer: Self-pay | Admitting: Family Medicine

## 2012-07-27 DIAGNOSIS — Z0000 Encounter for general adult medical examination without abnormal findings: Secondary | ICD-10-CM

## 2012-07-29 ENCOUNTER — Ambulatory Visit (HOSPITAL_BASED_OUTPATIENT_CLINIC_OR_DEPARTMENT_OTHER)
Admission: RE | Admit: 2012-07-29 | Discharge: 2012-07-29 | Disposition: A | Discharge status: Home / Self Care | Payer: BC Managed Care – PPO | Source: Ambulatory Visit | Attending: Family Medicine | Admitting: Family Medicine

## 2012-07-29 DIAGNOSIS — Z0000 Encounter for general adult medical examination without abnormal findings: Secondary | ICD-10-CM

## 2012-07-29 DIAGNOSIS — Z1231 Encounter for screening mammogram for malignant neoplasm of breast: Secondary | ICD-10-CM | POA: Insufficient documentation

## 2012-11-25 ENCOUNTER — Ambulatory Visit: Payer: BC Managed Care – PPO

## 2012-12-21 ENCOUNTER — Other Ambulatory Visit: Payer: Self-pay | Admitting: Family Medicine

## 2012-12-22 ENCOUNTER — Other Ambulatory Visit: Payer: Self-pay | Admitting: Family Medicine

## 2013-01-11 ENCOUNTER — Encounter: Payer: Self-pay | Admitting: Family Medicine

## 2013-01-11 ENCOUNTER — Ambulatory Visit (INDEPENDENT_AMBULATORY_CARE_PROVIDER_SITE_OTHER): Payer: BC Managed Care – PPO | Admitting: Family Medicine

## 2013-01-11 VITALS — BP 120/84 | Temp 98.9°F | Wt 166.0 lb

## 2013-01-11 DIAGNOSIS — J111 Influenza due to unidentified influenza virus with other respiratory manifestations: Secondary | ICD-10-CM

## 2013-01-11 MED ORDER — PREDNISONE 20 MG PO TABS: ORAL_TABLET | ORAL | Status: DC

## 2013-01-11 MED ORDER — DOXYCYCLINE HYCLATE 100 MG PO TABS: 100.0000 mg | ORAL_TABLET | Freq: Two times a day (BID) | ORAL | Status: DC

## 2013-01-11 MED ORDER — HYDROCODONE-HOMATROPINE 5-1.5 MG/5ML PO SYRP: 5.0000 mL | ORAL_SOLUTION | Freq: Three times a day (TID) | ORAL | Status: DC | PRN

## 2013-01-11 NOTE — Progress Notes (Signed)
   Subjective:    Patient ID: Anna Marquez, female    DOB: February 23, 1950, 63 y.o.   MRN: [id number]  HPI Anna Marquez is a 63 year old single female nonsmoker who comes in today for evaluation of a febrile illness for 4 days  She states last Friday she developed sudden onset of fever chills aches all over sore throat head congestion earache and cough. She was unable to sleep last night because of a cough. The fever is gone away. She did not get a flu shot this year. She's had a history of asthma in the past   Review of Systems    review of systems negative Objective:   Physical Exam  Well-developed well-nourished female no acute distress vital signs stable she's afebrile HEENT negative neck was supple no adenopathy lungs are clear except for some mild symmetrical expiratory wheezing      Assessment & Plan:  Influenza with secondary asthma plan treat C. treatment orders

## 2013-01-11 NOTE — Progress Notes (Signed)
Pre visit review using our clinic review tool, if applicable. No additional management support is needed unless otherwise documented below in the visit note. 

## 2013-01-11 NOTE — Patient Instructions (Signed)
Drink lots of water  Prednisone as directed  Hydromet,,,,,,,, 1/2-1 teaspoon 3 times daily. For cough  Doxycycline 100 mg,,,,, one twice daily for 10 days  Return when necessary

## 2013-01-22 ENCOUNTER — Telehealth: Payer: Self-pay | Admitting: Family Medicine

## 2013-01-23 ENCOUNTER — Ambulatory Visit (INDEPENDENT_AMBULATORY_CARE_PROVIDER_SITE_OTHER): Payer: BC Managed Care – PPO | Admitting: Family Medicine

## 2013-01-23 ENCOUNTER — Encounter: Payer: Self-pay | Admitting: Family Medicine

## 2013-01-23 VITALS — BP 150/80 | HR 80 | Temp 98.2°F | Wt 165.0 lb

## 2013-01-23 DIAGNOSIS — B9689 Other specified bacterial agents as the cause of diseases classified elsewhere: Secondary | ICD-10-CM

## 2013-01-23 DIAGNOSIS — J209 Acute bronchitis, unspecified: Secondary | ICD-10-CM

## 2013-01-23 DIAGNOSIS — A499 Bacterial infection, unspecified: Secondary | ICD-10-CM

## 2013-01-23 DIAGNOSIS — J208 Acute bronchitis due to other specified organisms: Principal | ICD-10-CM

## 2013-01-23 MED ORDER — DOXYCYCLINE HYCLATE 100 MG PO TABS: 100.0000 mg | ORAL_TABLET | Freq: Two times a day (BID) | ORAL | Status: DC

## 2013-01-23 MED ORDER — BENZONATATE 200 MG PO CAPS: 200.0000 mg | ORAL_CAPSULE | Freq: Two times a day (BID) | ORAL | Status: DC | PRN

## 2013-01-23 NOTE — Progress Notes (Signed)
Pre-visit discussion using our clinic review tool. No additional management support is needed unless otherwise documented below in the visit note.  

## 2013-01-23 NOTE — Progress Notes (Signed)
   Subjective:    Patient ID: Anna Marquez, female    DOB: Jun 01, 1950, 63 y.o.   MRN: [id number]  HPI Comments: Hx of frequent bronchitits.  URI  This is a new problem. The current episode started 1 to 4 weeks ago (1/1 onset of symptoms, dx with flu  by Dr. Sherren Mocha, treated with doxy x 10 day for OM on top of flu,  prednsione for wheeze). Progression since onset: somewhat better but not well. Associated symptoms include coughing, headaches, sinus pain and wheezing. Pertinent negatives include no rash. Associated symptoms comments: Ear pain has improved, still some plugged feeling.   Cough, productive. She has tried inhaler use (Prednisone, doxy, using albuterol once a day) for the symptoms. The treatment provided moderate relief.    HX of Afib  BP usually well controlled on lisinopril. No  Smoker  Tendency to asthmatic bronchitis per pt.  Review of Systems  Respiratory: Positive for cough and wheezing.   Skin: Negative for rash.  Neurological: Positive for headaches.       Objective:   Physical Exam  Constitutional: Vital signs are normal. She appears well-developed and well-nourished. She is cooperative.  Non-toxic appearance. She does not appear ill. No distress.  HENT:  Head: Normocephalic.  Right Ear: Hearing, tympanic membrane, external ear and ear canal normal. Tympanic membrane is not erythematous, not retracted and not bulging.  Left Ear: Hearing, tympanic membrane, external ear and ear canal normal. Tympanic membrane is not erythematous, not retracted and not bulging.  Nose: Mucosal edema and rhinorrhea present. Right sinus exhibits no maxillary sinus tenderness and no frontal sinus tenderness. Left sinus exhibits no maxillary sinus tenderness and no frontal sinus tenderness.  Mouth/Throat: Uvula is midline, oropharynx is clear and moist and mucous membranes are normal. No posterior oropharyngeal edema or posterior oropharyngeal erythema.  Eyes: Conjunctivae, EOM and lids are  normal. Pupils are equal, round, and reactive to light. Lids are everted and swept, no foreign bodies found.  Neck: Trachea normal and normal range of motion. Neck supple. Carotid bruit is not present. No mass and no thyromegaly present.  Cardiovascular: Normal rate, regular rhythm, S1 normal, S2 normal, normal heart sounds, intact distal pulses and normal pulses.  Exam reveals no gallop and no friction rub.   No murmur heard. Pulmonary/Chest: Effort normal and breath sounds normal. Not tachypneic. No respiratory distress. She has no decreased breath sounds. She has no wheezes. She has no rhonchi. She has no rales.  constant cough during exam  Neurological: She is alert.  Skin: Skin is warm, dry and intact. No rash noted.  Psychiatric: Her speech is normal and behavior is normal. Judgment normal. Her mood appears not anxious. Cognition and memory are normal. She does not exhibit a depressed mood.          Assessment & Plan:

## 2013-01-23 NOTE — Patient Instructions (Signed)
Follow BP at home, call PCP if consistently > 140/90.  Benzonatate for cough as needed.  Repeat course of doxycycline.  Expect cough to take a while to resolved given amount of inflammation present with recent infections. Go to ER if severe shortness of breath.

## 2013-01-23 NOTE — Assessment & Plan Note (Signed)
Super imposed on recent flu, minimal improvement. Will repeat doxy ( given pt history of lung infections), but did discuss possibility of post inflammatory bronchospasm.

## 2013-01-26 NOTE — Telephone Encounter (Signed)
[facility] Triage Call Report Triage Record Num: [id number] Operator: Noemi Chapel Patient Name: Anna Marquez Call Date & Time: 01/22/2013 5:46:06PM Patient Phone: [phone] PCP: Jory Ee. Todd Patient Gender: Female PCP Fax : [phone] Patient DOB: May 03, 1950 Practice Name: [facility] Reason for Call: Caller: [name]/Patient; PCP: Stevie Kern [facility]); CB#: [phone]; Call regarding Patient dx'd with Flu 1 week ago and is still having sx. Would like to know what else she can do over the weekend. Caller who sounds very congested calling to advise she was seen in the office on Monday 1/5 and diagnosed with flu, and ear infection and upper respiratory infection. She has history of asthma and was given Doxycycline to take for 10 days, Hydrocodone Cough syrup and tapering dose of steroids. She has competed the medications and is on last few doses of steroids. She still has congestion, cough-yellowish mucous, headaches, sore throat, low grade fevers, and congestion in ears persists. She was not advised to use any otc medications along with the prescriptions. She is not sleeping well at night due to cough. Caller is asking if she needs refills of her medications. Per Upper Respiratory Infection Protocol, Productive Cough with colored sputum, See Provider within 24 hours. Caller advised she should be seen in the Saturday am sick clinic for re-evaluation of symptoms. She is agreeable. Appointment scheduled for Sat 01/23/13 at 9:45 at [facility] office with Dr Diona Browner. Caller given home care and advised to use otc decongestant (Mucinex) along with the Tylenol she is taking for headache and pain. She is agreeable. Protocol(s) Used: Upper Respiratory Infection (URI) Recommended Outcome per Protocol: See Provider within 24 hours Reason for Outcome: Productive cough with colored sputum (other than clear or white sputum) Care Advice: Drink more fluids -- water, low-sugar  juices, tea and warm soup, especially chicken broth, are options. Avoid caffeinated or alcoholic beverages because they can increase the chance of dehydration. ~ Analgesic/Antipyretic Advice - Acetaminophen: Consider acetaminophen as directed on label or by pharmacist/provider for pain or fever PRECAUTIONS: - Use if there is no history of liver disease, alcoholism, or intake of three or more alcohol drinks per day - Only if approved by provider during pregnancy or when breastfeeding - During pregnancy, acetaminophen should not be taken more than 3 consecutive days without telling provider - Do not exceed recommended dose or frequency ~ Total water intake includes drinking water, water in beverages, and water contained in food. Fluids make up about 80% of the body's total hydration need. Individual fluid requirement to maintain hydration vary based on physical activity, environmental factors and illness. Limit fluids that contain sugar, caffeine, or alcohol. Urine will be very light yellow color when you drink enough fluids. ~ 01/

## 2013-02-02 ENCOUNTER — Encounter: Payer: Self-pay | Admitting: Family Medicine

## 2013-02-02 ENCOUNTER — Telehealth: Payer: Self-pay | Admitting: Family Medicine

## 2013-02-02 ENCOUNTER — Ambulatory Visit (INDEPENDENT_AMBULATORY_CARE_PROVIDER_SITE_OTHER): Payer: BC Managed Care – PPO | Admitting: Family Medicine

## 2013-02-02 VITALS — BP 138/90 | HR 79 | Temp 98.4°F | Resp 16 | Wt 167.4 lb

## 2013-02-02 DIAGNOSIS — I10 Essential (primary) hypertension: Secondary | ICD-10-CM

## 2013-02-02 NOTE — Progress Notes (Signed)
Pre-visit discussion using our clinic review tool. No additional management support is needed unless otherwise documented below in the visit note.  

## 2013-02-02 NOTE — Patient Instructions (Signed)
Nice to meet you Change batteries on your monitor Please bring in the monitor to have it calibrated at next appointment.  Watch trend of number more than actual number.  If headaches, weakness in arm or leg or trouble with speech of course seek medical attention immediately.

## 2013-02-02 NOTE — Telephone Encounter (Signed)
Patient Information:  Caller Name: Ali Mclaurin  Phone: [phone]  Patient: Anna Marquez, Anna Marquez  Gender: Female  DOB: 01-03-1951  Age: 63 Years  PCP: Stevie Kern [facility])  Office Follow Up:  Does the office need to follow up with this patient?: No  Instructions For The Office: N/A   Symptoms  Reason For Call & Symptoms: Pt calling stating her blood pressure has been elevated since. 188/114 now at 1230 on 02/02/13. Again-179/104 in left arm. Has a slight headache. Denies visual chx.  Reviewed Health History In EMR: Yes  Reviewed Medications In EMR: Yes  Reviewed Allergies In EMR: Yes  Reviewed Surgeries / Procedures: Yes  Date of Onset of Symptoms: 01/23/2013  Guideline(s) Used:  High Blood Pressure  Disposition Per Guideline:   See Today in Office  Reason For Disposition Reached:   BP > 180/110  Advice Given:  N/A  Patient Will Follow Care Advice:  YES  Appointment Scheduled: No appt at [facility]-See now in office disp. Pt preferred [facility]  02/02/2013 13:30:00 Appointment Scheduled Provider:  Hulan Saas

## 2013-02-02 NOTE — Progress Notes (Signed)
Subjective:  Anna Marquez is a 63 y.o. female with hypertension. Patient has been watching her blood pressure and has noticed that her systolic blood pressure has been 161 systolic. Patient denies any significant side effects at this time and denies any headache, any shortness of breath, any visual changes, any weakness in the extremities or any chest pain. Patient states that she's been feeling like herself but unfortunately continues to have measurements in the 180s even though she is taking all medications listed. Patient has really had a cold but is not taking any over-the-counter medications. Denies any fevers or chills or any abnormal weight loss.  Current Outpatient Prescriptions  Medication Sig Dispense Refill  . albuterol (PROVENTIL HFA;VENTOLIN HFA) 108 (90 BASE) MCG/ACT inhaler Inhale 2 puffs into the lungs every 6 (six) hours as needed. For shortness of breath or wheezing  1 Inhaler  3  . aspirin 325 MG tablet Take 325 mg by mouth daily.      . Cholecalciferol (VITAMIN D) 2000 UNITS CAPS Take by mouth daily.        [name] conjugated estrogens (PREMARIN) vaginal cream Place 1 g vaginally 2 (two) times a week.      . doxycycline (VIBRA-TABS) 100 MG tablet Take 1 tablet (100 mg total) by mouth 2 (two) times daily.  20 tablet  0  . esomeprazole (NEXIUM) 40 MG capsule TAKE ONE CAPSULE TWICE DAILY  180 capsule  3  . fish oil-omega-3 fatty acids 1000 MG capsule Take 1 g by mouth daily.       . fluticasone (FLONASE) 50 MCG/ACT nasal spray USE ONE SPRAY IN EACH NOSTRIL DAILY.  16 g  10  . hydrochlorothiazide (HYDRODIURIL) 25 MG tablet Take 1 tablet (25 mg total) by mouth daily as needed. For fluid retention  100 tablet  3  . HYDROcodone-acetaminophen (VICODIN) 5-500 MG per tablet Take 1 tablet by mouth every 6 (six) hours as needed. For pain  30 tablet  1  . lisinopril (PRINIVIL,ZESTRIL) 10 MG tablet Take 1 tablet (10 mg total) by mouth daily.  100 tablet  3  . loratadine (CLARITIN) 10 MG tablet Take  10 mg by mouth daily as needed. For allergies      . nadolol (CORGARD) 40 MG tablet Take 1 tablet (40 mg total) by mouth daily.  100 tablet  3  . sucralfate (CARAFATE) 1 G tablet TAKE ONE TABLET TWICE DAILY  60 tablet  0  . SUMAtriptan (IMITREX) 5 MG/ACT nasal spray Place 1 spray (5 mg total) into the nose every 2 (two) hours as needed. For migraines  3 Inhaler  10  . vitamin E 400 UNIT capsule Take 400 Units by mouth daily.      . benzonatate (TESSALON) 200 MG capsule Take 1 capsule (200 mg total) by mouth 2 (two) times daily as needed for cough.  20 capsule  0   No current facility-administered medications for this visit.    Hypertension ROS: taking medications as instructed, no medication side effects noted, home BP monitoring in range of 096'E systolic over 454'U diastolic, no TIA's, no chest pain on exertion, no dyspnea on exertion and no swelling of ankles.  New concerns: just readings. .   Objective:  BP 138/90  Pulse 79  Temp(Src) 98.4 F (36.9 C) (Oral)  Resp 16  Wt 167 lb 6.4 oz (75.932 kg)  SpO2 95%  Appearance alert, well appearing, and in no distress, oriented to person, place, and time and normal appearing weight. General  exam BP noted to be well controlled today in office, S1, S2 normal, no gallop, no murmur, chest clear, no JVD, no HSM, no edema, CVS exam  - normal rate, regular rhythm, normal S1, S2, no murmurs, rubs, clicks or gallops.  Lab review: labs are reviewed, up to date and normal.   Assessment:   Hypertension stable, reasonably well controlled, no significant medication side effects noted and Discussed changing batteries in monitor, discussed bringing it in for review and testing against our machine. .   Plan:  Current treatment plan is effective, no change in therapy. Reviewed diet, exercise and weight control. Reviewed medications and side effects in detail. Follow up: 2 weeks and as needed..With diary.

## 2013-02-11 ENCOUNTER — Telehealth: Payer: Self-pay | Admitting: Family Medicine

## 2013-02-11 NOTE — Telephone Encounter (Signed)
Relevant patient education assigned to patient using Emmi. ° °

## 2013-02-17 ENCOUNTER — Other Ambulatory Visit: Payer: Self-pay | Admitting: Family Medicine

## 2013-02-18 ENCOUNTER — Other Ambulatory Visit (INDEPENDENT_AMBULATORY_CARE_PROVIDER_SITE_OTHER): Payer: BC Managed Care – PPO

## 2013-02-18 ENCOUNTER — Other Ambulatory Visit: Payer: Self-pay | Admitting: Family Medicine

## 2013-02-18 DIAGNOSIS — Z0000 Encounter for general adult medical examination without abnormal findings: Secondary | ICD-10-CM

## 2013-02-18 LAB — CBC WITH DIFFERENTIAL/PLATELET
BASOS PCT: 0.9 % (ref 0.0–3.0)
Basophils Absolute: 0.1 10*3/uL (ref 0.0–0.1)
EOS PCT: 2.5 % (ref 0.0–5.0)
Eosinophils Absolute: 0.1 10*3/uL (ref 0.0–0.7)
HEMATOCRIT: 39.6 % (ref 36.0–46.0)
Hemoglobin: 13.1 g/dL (ref 12.0–15.0)
LYMPHS ABS: 1.8 10*3/uL (ref 0.7–4.0)
Lymphocytes Relative: 30.2 % (ref 12.0–46.0)
MCHC: 33 g/dL (ref 30.0–36.0)
MCV: 90.9 fl (ref 78.0–100.0)
MONOS PCT: 6.4 % (ref 3.0–12.0)
Monocytes Absolute: 0.4 10*3/uL (ref 0.1–1.0)
NEUTROS ABS: 3.5 10*3/uL (ref 1.4–7.7)
Neutrophils Relative %: 60 % (ref 43.0–77.0)
Platelets: 196 10*3/uL (ref 150.0–400.0)
RBC: 4.36 Mil/uL (ref 3.87–5.11)
RDW: 14.1 % (ref 11.5–14.6)
WBC: 5.9 10*3/uL (ref 4.5–10.5)

## 2013-02-18 LAB — HEPATIC FUNCTION PANEL
ALK PHOS: 48 U/L (ref 39–117)
ALT: 12 U/L (ref 0–35)
AST: 14 U/L (ref 0–37)
Albumin: 4.1 g/dL (ref 3.5–5.2)
BILIRUBIN DIRECT: 0.2 mg/dL (ref 0.0–0.3)
Total Bilirubin: 2.2 mg/dL — ABNORMAL HIGH (ref 0.3–1.2)
Total Protein: 7.4 g/dL (ref 6.0–8.3)

## 2013-02-18 LAB — LDL CHOLESTEROL, DIRECT: Direct LDL: 110.6 mg/dL

## 2013-02-18 LAB — BASIC METABOLIC PANEL
BUN: 13 mg/dL (ref 6–23)
CALCIUM: 9.6 mg/dL (ref 8.4–10.5)
CHLORIDE: 105 meq/L (ref 96–112)
CO2: 26 meq/L (ref 19–32)
CREATININE: 0.8 mg/dL (ref 0.4–1.2)
GFR: 82.68 mL/min (ref >60.00)
GLUCOSE: 83 mg/dL (ref 70–99)
Potassium: 3.7 mEq/L (ref 3.5–5.1)
Sodium: 140 mEq/L (ref 135–145)

## 2013-02-18 LAB — TSH: TSH: 0.38 u[IU]/mL (ref 0.35–5.50)

## 2013-02-18 LAB — POCT URINALYSIS DIPSTICK
BILIRUBIN UA: NEGATIVE
Blood, UA: NEGATIVE
Glucose, UA: NEGATIVE
KETONES UA: NEGATIVE
LEUKOCYTES UA: NEGATIVE
Nitrite, UA: NEGATIVE
PROTEIN UA: NEGATIVE
Spec Grav, UA: <=1.005
Urobilinogen, UA: 0.2
pH, UA: 5.5

## 2013-02-18 LAB — LIPID PANEL
CHOL/HDL RATIO: 4
Cholesterol: 195 mg/dL (ref 0–200)
HDL: 50.2 mg/dL (ref >39.00)
Triglycerides: 227 mg/dL — ABNORMAL HIGH (ref 0.0–149.0)
VLDL: 45.4 mg/dL — ABNORMAL HIGH (ref 0.0–40.0)

## 2013-02-19 ENCOUNTER — Other Ambulatory Visit: Payer: Self-pay | Admitting: Family Medicine

## 2013-02-25 ENCOUNTER — Encounter: Payer: Self-pay | Admitting: Internal Medicine

## 2013-02-25 ENCOUNTER — Ambulatory Visit (INDEPENDENT_AMBULATORY_CARE_PROVIDER_SITE_OTHER): Payer: BC Managed Care – PPO | Admitting: Family Medicine

## 2013-02-25 ENCOUNTER — Other Ambulatory Visit (HOSPITAL_COMMUNITY)
Admission: RE | Admit: 2013-02-25 | Discharge: 2013-02-25 | Disposition: A | Discharge status: Home / Self Care | Payer: BC Managed Care – PPO | Source: Ambulatory Visit | Attending: Family Medicine | Admitting: Family Medicine

## 2013-02-25 ENCOUNTER — Encounter: Payer: Self-pay | Admitting: Family Medicine

## 2013-02-25 VITALS — BP 120/80 | Temp 98.2°F | Ht 64.5 in | Wt 166.0 lb

## 2013-02-25 DIAGNOSIS — I10 Essential (primary) hypertension: Secondary | ICD-10-CM

## 2013-02-25 DIAGNOSIS — N814 Uterovaginal prolapse, unspecified: Secondary | ICD-10-CM | POA: Insufficient documentation

## 2013-02-25 DIAGNOSIS — K449 Diaphragmatic hernia without obstruction or gangrene: Secondary | ICD-10-CM

## 2013-02-25 DIAGNOSIS — Z86010 Personal history of colonic polyps: Secondary | ICD-10-CM

## 2013-02-25 DIAGNOSIS — Z01419 Encounter for gynecological examination (general) (routine) without abnormal findings: Secondary | ICD-10-CM | POA: Insufficient documentation

## 2013-02-25 DIAGNOSIS — K219 Gastro-esophageal reflux disease without esophagitis: Secondary | ICD-10-CM

## 2013-02-25 DIAGNOSIS — Z1211 Encounter for screening for malignant neoplasm of colon: Secondary | ICD-10-CM

## 2013-02-25 DIAGNOSIS — I059 Rheumatic mitral valve disease, unspecified: Secondary | ICD-10-CM

## 2013-02-25 DIAGNOSIS — I340 Nonrheumatic mitral (valve) insufficiency: Secondary | ICD-10-CM

## 2013-02-25 DIAGNOSIS — R319 Hematuria, unspecified: Secondary | ICD-10-CM

## 2013-02-25 DIAGNOSIS — J309 Allergic rhinitis, unspecified: Secondary | ICD-10-CM

## 2013-02-25 DIAGNOSIS — G43009 Migraine without aura, not intractable, without status migrainosus: Secondary | ICD-10-CM

## 2013-02-25 DIAGNOSIS — K5730 Diverticulosis of large intestine without perforation or abscess without bleeding: Secondary | ICD-10-CM

## 2013-02-25 MED ORDER — NADOLOL 40 MG PO TABS: ORAL_TABLET | ORAL | Status: DC

## 2013-02-25 MED ORDER — RIZATRIPTAN BENZOATE 5 MG PO TBDP: 5.0000 mg | ORAL_TABLET | ORAL | Status: DC | PRN

## 2013-02-25 MED ORDER — ESOMEPRAZOLE MAGNESIUM 40 MG PO CPDR: DELAYED_RELEASE_CAPSULE | ORAL | Status: DC

## 2013-02-25 MED ORDER — FLUTICASONE PROPIONATE 50 MCG/ACT NA SUSP: NASAL | Status: DC

## 2013-02-25 MED ORDER — HYDROCHLOROTHIAZIDE 25 MG PO TABS: 25.0000 mg | ORAL_TABLET | Freq: Every day | ORAL | Status: DC | PRN

## 2013-02-25 MED ORDER — ALBUTEROL SULFATE HFA 108 (90 BASE) MCG/ACT IN AERS: 2.0000 | INHALATION_SPRAY | Freq: Four times a day (QID) | RESPIRATORY_TRACT | Status: DC | PRN

## 2013-02-25 MED ORDER — SUCRALFATE 1 G PO TABS: ORAL_TABLET | ORAL | Status: DC

## 2013-02-25 MED ORDER — LISINOPRIL 5 MG PO TABS: 5.0000 mg | ORAL_TABLET | Freq: Every day | ORAL | Status: DC

## 2013-02-25 NOTE — Progress Notes (Signed)
Subjective:    Patient ID: Anna Marquez, female    DOB: 19-Dec-1950, 63 y.o.   MRN: [id number]  HPI Anna Marquez is a delightful 63 year old single female nonsmoker,,,,,, recently lost her job along with 400 other people,,,,,,,,, who comes in today for annual physical examination  She has a history of postmenopausal vaginal dryness uses hormonal cream once weekly  She has a history of allergic rhinitis he uses over-the-counter Claritin a steroid nasal spray  Visit history of hypertension takes hydrochlorothiazide and 10 mg of lisinopril daily. She also takes Corgard one tablet daily or sometimes twice daily for palpitations from the MVP  She takes Nexium 40 mg daily for chronic reflux along with Carafate  She is Imitrex nasal spray was switched to the Maxalt for migraine headaches although they become much less frequent since she's got a little older  She is albuterol when necessary when she has wheezing from a bad cold  She's having more difficulty with her uterine prolapse. His present for years but now is getting worse. 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 Mother's care for mainly by the daughter [name]. His 2 point where she would like to get a consult to see what her options are. 6   Review of Systems  Constitutional: Negative.   HENT: Negative.   Eyes: Negative.   Respiratory: Negative.   Cardiovascular: Negative.   Gastrointestinal: Negative.   Endocrine: Negative.   Genitourinary: Negative.   Musculoskeletal: Negative.   Allergic/Immunologic: Negative.   Neurological: Negative.   Hematological: Negative.   Psychiatric/Behavioral: Negative.        Objective:   Physical Exam  Nursing note and vitals reviewed. Constitutional: She appears well-developed and well-nourished.  HENT:  Head: Normocephalic and atraumatic.  Right Ear: External ear normal.  Left Ear: External ear normal.  Nose: Nose normal.    Mouth/Throat: Oropharynx is clear and moist.  Eyes: EOM are normal. Pupils are equal, round, and reactive to light.  Neck: Normal range of motion. Neck supple. No thyromegaly present.  Cardiovascular: Normal rate, regular rhythm, normal heart sounds and intact distal pulses.  Exam reveals no gallop and no friction rub.   No murmur heard. No carotid or aortic bruits peripheral pulses 1+ and symmetrical murmur not audible today  Pulmonary/Chest: Effort normal and breath sounds normal.  Abdominal: Soft. Bowel sounds are normal. She exhibits no distension and no mass. There is no tenderness. There is no rebound.  Genitourinary: Vagina normal and uterus normal. Guaiac negative stool. No vaginal discharge found.  Bilateral breast exam normal  She has a cystocele and rectocele  Musculoskeletal: Normal range of motion.  Lymphadenopathy:    She has no cervical adenopathy.  Neurological: She is alert. She has normal reflexes. No cranial nerve deficit. She exhibits normal muscle tone. Coordination normal.  Skin: Skin is warm and dry.  Psychiatric: She has a normal mood and affect. Her behavior is normal. Judgment and thought content normal.          Assessment & Plan:  Allergic rhinitis continue current therapy  Postmenopausal vaginal dryness continue hormonal cream once or twice weekly  Hypertension decrease lisinopril to 5 mg a day BP 120/80 and she feels a little lightheaded at times  History of M.D. P. Corgard 40 mg daily or twice a day when necessary  Reflux esophagitis continue Nexium and Carafate  Migraine headaches which the Maxalt sublingual  Uterine prolapse refer  to Dr. [name] for consult

## 2013-02-25 NOTE — Patient Instructions (Signed)
Called Dr. Abbie Sons and set up a GYN consult for evaluation of the prolapse  Decrease the lisinopril to 5 mg daily  Corgard 40 mg,,,,,,, one twice daily when necessary for palpitations  Continue your other medications  Followup in 1 year sooner if any problems  A put in her consult note for Dr. Corrinne Eagle because of the positive stool guaiac

## 2013-02-25 NOTE — Progress Notes (Signed)
Pre visit review using our clinic review tool, if applicable. No additional management support is needed unless otherwise documented below in the visit note. 

## 2013-03-02 ENCOUNTER — Encounter: Payer: Self-pay | Admitting: *Deleted

## 2013-03-04 ENCOUNTER — Ambulatory Visit: Payer: BC Managed Care – PPO | Admitting: Nurse Practitioner

## 2013-03-09 ENCOUNTER — Encounter: Payer: Self-pay | Admitting: Gynecology

## 2013-03-09 ENCOUNTER — Ambulatory Visit (INDEPENDENT_AMBULATORY_CARE_PROVIDER_SITE_OTHER): Payer: BC Managed Care – PPO | Admitting: Gynecology

## 2013-03-09 ENCOUNTER — Ambulatory Visit: Payer: Self-pay | Admitting: Gynecology

## 2013-03-09 VITALS — BP 120/76 | Ht 65.0 in | Wt 165.0 lb

## 2013-03-09 DIAGNOSIS — N8111 Cystocele, midline: Secondary | ICD-10-CM

## 2013-03-09 DIAGNOSIS — N814 Uterovaginal prolapse, unspecified: Secondary | ICD-10-CM

## 2013-03-09 DIAGNOSIS — N816 Rectocele: Secondary | ICD-10-CM

## 2013-03-09 NOTE — Progress Notes (Signed)
Anna Marquez [birth year] [id number]        63 y.o.  G2P2 new patient referred in consultation by Dr. Stevie Kern for worsening prolapse. Patient notes for several years she was told that she had some prolapse but was not overly bothersome to her. Was not having issues of urinary frequency or incontinence. No issues of stool bulging or trapping. Over the past several months her symptoms have worsened that now she feels something protruding from her vagina when she strains. Again she is not having issues with urinary incontinence or urgency and not having any bowel symptoms.  She recently saw Dr. Sherren Mocha who is her primary physician who referred her for evaluation. She had seen Dr. Brown Human her urologist who follows her for her kidney stones who felt that her issue was primary gynecologic. Dr. Sherren Mocha does her general exam to include breast and pelvic exam noting a recent Pap smear that was normal. She uses vaginal estrogen cream twice weekly. Is sexually active.  Past medical history,surgical history, problem list, medications, allergies, family history and social history were all reviewed and documented in the EPIC chart.  ROS:  Performed and pertinent positives and negatives are included in the history, assessment and plan .  Exam: [name] assistant Filed Vitals:   03/09/13 1605  BP: 120/76  Height: 5\' 5"  (1.651 m)  Weight: 165 lb (74.844 kg)   General appearance  Normal Skin grossly normal Pelvic  Ext/BUS/vagina generalized atrophic changes with first to second degree cystocele, second degree uterine prolapse and first to second degree rectocele.  Cervix atrophic in appearance with descent to within 2 fingerbreadths of the introitus.  Uterus grossly normal size, shape and contour, midline and mobile nontender   Adnexa  Without masses or tenderness    Anus and perineum  Normal   Rectovaginal  Normal sphincter tone without palpated masses or tenderness. First to second-degree  rectocele.   Assessment/Plan:  63 y.o. G2P2 female with several year history of pelvic prolapse now symptomatic with straining. No overt urinary or bowel symptoms. Options for management including expectant management, pessary and surgery reviewed. Proposed surgery includes TVH BSO anterior and posterior colporrhaphy. The ovarian conservation issue was discussed and the issue if unable to retrieve the ovaries vaginally would a second incision be made such as laparoscopic to remove the ovaries or if they appeared postmenopausal from a vaginal standpoint with the patient prefer leaving them with the risk of ovarian disease/cancer in the future. Was involved with the surgery to include the expected intraoperative, postoperative courses and recovery period was reviewed. Risks of infection, prolonged antibiotics, transfusion, damage to surrounding tissues including bladder ureters bowel rectum nerves discussed. Major reparative surgeries and future repair of surgery possibilities to include bowel resection ostomy formation bladder repair ureteral damage repair was all reviewed with her. No guarantees as far as life of the repair and possible recurrence of her prolapse and need for future surgeries to include mesh discussed. Sexual dysfunction risks following the surgery to include orgasmic dysfunction and persistent dyspareunia reviewed. The patient wants to proceed with scheduling the surgery. I did advise a screening ultrasound for ovarian disease preoperatively and she'll return for this as well as for a full preoperative consult.   Note: This document was prepared with digital dictation and possible smart phrase technology. Any transcriptional errors that result from this process are unintentional.   Anastasio Auerbach MD, 5:17 PM 03/09/2013

## 2013-03-09 NOTE — Patient Instructions (Signed)
Office will contact you to arrange surgery. 

## 2013-03-10 ENCOUNTER — Encounter: Payer: Self-pay | Admitting: Physician Assistant

## 2013-03-10 ENCOUNTER — Ambulatory Visit (INDEPENDENT_AMBULATORY_CARE_PROVIDER_SITE_OTHER): Payer: BC Managed Care – PPO | Admitting: Physician Assistant

## 2013-03-10 VITALS — BP 128/78 | HR 82 | Ht 65.0 in | Wt 166.0 lb

## 2013-03-10 DIAGNOSIS — R195 Other fecal abnormalities: Secondary | ICD-10-CM

## 2013-03-10 DIAGNOSIS — Z860100 Personal history of colon polyps, unspecified: Secondary | ICD-10-CM

## 2013-03-10 DIAGNOSIS — K317 Polyp of stomach and duodenum: Secondary | ICD-10-CM

## 2013-03-10 DIAGNOSIS — K219 Gastro-esophageal reflux disease without esophagitis: Secondary | ICD-10-CM

## 2013-03-10 DIAGNOSIS — D131 Benign neoplasm of stomach: Secondary | ICD-10-CM

## 2013-03-10 MED ORDER — MOVIPREP 100 G PO SOLR: 1.0000 | ORAL | Status: DC

## 2013-03-10 NOTE — Patient Instructions (Signed)
You have been scheduled for an endoscopy and colonoscopy with propofol. Please follow the written instructions given to you at your visit today. Please pick up your prep at the pharmacy within the next 1-3 days. [facility].  We have given you a $10 off coupon for the prep. Give this to the pharmacist. If you use inhalers (even only as needed), please bring them with you on the day of your procedure.

## 2013-03-11 ENCOUNTER — Encounter: Payer: Self-pay | Admitting: Physician Assistant

## 2013-03-11 ENCOUNTER — Encounter: Payer: Self-pay | Admitting: Internal Medicine

## 2013-03-11 ENCOUNTER — Other Ambulatory Visit: Payer: Self-pay | Admitting: Gynecology

## 2013-03-11 DIAGNOSIS — Z01818 Encounter for other preprocedural examination: Secondary | ICD-10-CM

## 2013-03-11 NOTE — Progress Notes (Signed)
Subjective:    Patient ID: Anna Marquez, female    DOB: Dec 14, 1950, 63 y.o.   MRN: [id number]  HPI Anna Marquez is a pleasant 63 year old female known to Dr. Delfin Edis from previous procedures. She's currently referred by Dr. Sherren Mocha for evaluation of Hemoccult-positive stool. Patient had colonoscopy last in 2003 and did have a 6 mm polyp removed which was a adenomatous polyp. She also has diverticulosis. She had EGD in March of 2012 and noted to have a hiatal hernia mild gastritis and multiple gastric polyps which were hyperplastic. Is no family history of colon cancer area she is anticipating surgery within the next month or so for a cystocele with uterine prolapse. She had a recent physical and stool is noted be Hemoccult-positive. She is not anemic. Her last hemoglobin was 13.1 hematocrit of 39.6. She has no current complaints of abdominal pain melena hematochezia or change in bowel habits. She is maintained on Nexium for acid reflux and this controls her symptoms well. She also takes a whole aspirin daily due to  history of atrial fibrillation.    Review of Systems  Constitutional: Negative.   HENT: Negative.   Respiratory: Negative.   Cardiovascular: Negative.   Gastrointestinal: Negative.   Endocrine: Negative.   Genitourinary: Negative.   Musculoskeletal: Negative.   Allergic/Immunologic: Negative.   Neurological: Negative.   Hematological: Negative.   Psychiatric/Behavioral: Negative.    Outpatient Prescriptions Prior to Visit  Medication Sig Dispense Refill  . albuterol (PROVENTIL HFA;VENTOLIN HFA) 108 (90 BASE) MCG/ACT inhaler Inhale 2 puffs into the lungs every 6 (six) hours as needed. For shortness of breath or wheezing  1 Inhaler  3  . aspirin 325 MG tablet Take 325 mg by mouth daily.      . Cholecalciferol (VITAMIN D) 2000 UNITS CAPS Take by mouth daily.        [name] conjugated estrogens (PREMARIN) vaginal cream Place 1 g vaginally 2 (two) times a week.      . esomeprazole  (NEXIUM) 40 MG capsule TAKE ONE CAPSULE TWICE DAILY  200 capsule  3  . fish oil-omega-3 fatty acids 1000 MG capsule Take 1 g by mouth daily.       . fluticasone (FLONASE) 50 MCG/ACT nasal spray USE ONE SPRAY IN EACH NOSTRIL DAILY.  32 g  6  . hydrochlorothiazide (HYDRODIURIL) 25 MG tablet Take 1 tablet (25 mg total) by mouth daily as needed. For fluid retention  100 tablet  3  . lisinopril (PRINIVIL,ZESTRIL) 5 MG tablet Take 1 tablet (5 mg total) by mouth daily.  90 tablet  3  . loratadine (CLARITIN) 10 MG tablet Take 10 mg by mouth daily as needed. For allergies      . nadolol (CORGARD) 40 MG tablet TAKE ONE TABLET BY MOUTH twice daily  100 tablet  3  . rizatriptan (MAXALT-MLT) 5 MG disintegrating tablet Take 1 tablet (5 mg total) by mouth as needed for migraine. May repeat in 2 hours if needed  10 tablet  0  . sucralfate (CARAFATE) 1 G tablet TAKE ONE TABLET TWICE DAILY  200 tablet  3  . vitamin E 400 UNIT capsule Take 400 Units by mouth daily.       No facility-administered medications prior to visit.   Allergies  Allergen Reactions  . Erythromycin Nausea And Vomiting    REACTION: Stomach upset  . Penicillins Hives   Patient Active Problem List   Diagnosis Date Noted  . Cystocele with uterine prolapse 02/25/2013  .  Acute bacterial bronchitis 01/23/2013  . Influenza with other respiratory manifestations 01/11/2013  . Abnormal results of thyroid function studies 02/18/2012  . Essential hypertension 01/27/2012  . Atrial fibrillation, new onset 01/09/2012  . DYSPLASTIC NEVUS, CHEST 11/14/2009  . HEMATURIA UNSPECIFIED 03/21/2009  . COMMON MIGRAINE 05/20/2008  . ALLERGIC RHINITIS 05/20/2008  . Mitral regurgitation 08/10/2007  . GERD 08/10/2007  . HIATAL HERNIA 08/10/2007  . DIVERTICULOSIS, COLON 08/10/2007  . OSTEOARTHRITIS 08/10/2007  . COLONIC POLYPS, ADENOMATOUS, HX OF 08/10/2007   History  Substance Use Topics  . Smoking status: Never Smoker   . Smokeless tobacco: Not on  file  . Alcohol Use: Yes     Comment: Occas   family history includes Breast cancer (age of onset: 77) in her paternal aunt; Cancer in her father; Diabetes in her maternal grandmother; Heart disease in her maternal grandfather and maternal grandmother; Melanoma in her paternal aunt.    Objective:   Physical Exam  well-developed white female in no acute distress, pleasant blood pressure 120/78 pulse 82 height 5 foot 5 weight 166. HEENT; nontraumatic normocephalic EOMI PERRLA sclera anicteric, Supple; no JVD, Cardiovascular; regular rate and rhythm with S1-S2 no murmur or gallop, Pulmonary; clear bilaterally, Abdomen; soft nontender nondistended bowel sounds are active there is no palpable mass or hepatosplenomegaly, Rectal; exam not done, Extremities; no clubbing cyanosis or edema skin warm and dry, Psych a much; mood and affect normal and appropriate        Assessment & Plan:  #43  63 year old with asymptomatic Hemoccult-positive stool and normal hemoglobin. #2 history of adenomatous colon polyp 2003 overdue for followup #3 chronic GERD #4 history of multiple hyperplastic gastric polyps #5 diverticulosis #6 hypertension #7 cystocele with uterine prolapse anticipating surgery  Plan; Patient will be scheduled for colonoscopy and EGD with Dr. Olevia Perches.. Procedures discussed in detail with the patient and she is agreeable to proceed.  Continue Nexium 40 mg twice daily

## 2013-03-11 NOTE — Progress Notes (Signed)
Reviewed and agree.

## 2013-03-12 ENCOUNTER — Ambulatory Visit (AMBULATORY_SURGERY_CENTER): Payer: BC Managed Care – PPO | Admitting: Internal Medicine

## 2013-03-12 ENCOUNTER — Encounter: Payer: Self-pay | Admitting: Internal Medicine

## 2013-03-12 VITALS — BP 147/96 | HR 73 | Temp 99.3°F | Resp 48 | Ht 65.0 in | Wt 166.0 lb

## 2013-03-12 DIAGNOSIS — K219 Gastro-esophageal reflux disease without esophagitis: Secondary | ICD-10-CM

## 2013-03-12 DIAGNOSIS — R195 Other fecal abnormalities: Secondary | ICD-10-CM

## 2013-03-12 DIAGNOSIS — D126 Benign neoplasm of colon, unspecified: Secondary | ICD-10-CM

## 2013-03-12 MED ORDER — SODIUM CHLORIDE 0.9 % IV SOLN: 500.0000 mL | INTRAVENOUS | Status: DC

## 2013-03-12 NOTE — Progress Notes (Signed)
Report to pacu rn, vss, bbs=clear 

## 2013-03-12 NOTE — Op Note (Signed)
[facility]  [address]. [place], [id number]   COLONOSCOPY PROCEDURE REPORT  PATIENT: Anna Marquez, Anna Marquez  MR#: [id number] BIRTHDATE: 1950-08-25 , 39  yrs. old GENDER: Female ENDOSCOPIST: Lafayette Dragon, MD REFERRED XB:[id number] Delora Fuel, M.D. PROCEDURE DATE:  03/12/2013 PROCEDURE:   Colonoscopy with snare polypectomy and Colonoscopy with cold biopsy polypectomy First Screening Colonoscopy - Avg.  risk and is 50 yrs.  old or older - No.  Prior Negative Screening - Now for repeat screening. 10 or more years since last screening  History of Adenoma - Now for follow-up colonoscopy & has been > or = to 3 yrs.  N/A  Polyps Removed Today? Yes. ASA CLASS:   Class II INDICATIONS:Average risk patient for colon cancer and heme-positive stool on office exam by Dr Governor Specking colonoscopy in 2003 was normal, MEDICATIONS: Propofol (Diprivan) 120 mg IV  DESCRIPTION OF PROCEDURE:   After the risks benefits and alternatives of the procedure were thoroughly explained, informed consent was obtained.  A digital rectal exam revealed no abnormalities of the rectum.   The LB [id number]  endoscope was introduced through the anus and advanced to the cecum, which was identified by both the appendix and ileocecal valve. No adverse events experienced.   The quality of the prep was good, using MoviPrep  The instrument was then slowly withdrawn as the colon was fully examined.      COLON FINDINGS: Two ulcerated pedunculated polyps ranging between 5-7mm in size was found in the ascending colon and a 3 mm sessile polyp at 70 cm in transverse colon located within a diverticulum and surrounded by edema and erythema ( ?inflammatory?)  A polypectomy was performed using  cold forceps.  The resection was complete and the polyp tissue was completely retrieved.   Moderate diverticulosis was noted throughout the entire examined colon. Retroflexed views revealed no abnormalities. The time to  cecum=3 minutes 44 seconds.  Withdrawal time=13 minutes 29 seconds.  The scope was withdrawn and the procedure completed. COMPLICATIONS: There were no complications.  ENDOSCOPIC IMPRESSION: 1.   Two pedunculated polyps ranging between 5-54mm in size were found in the ascending colon and transverse colon; polypectomy was performed using snare cautery and with cold forceps 2.   Moderate diverticulosis was noted throughout the entire examined colon  RECOMMENDATIONS: 1.  Await biopsy results 2.  no aspirin or NSAIDs x 2 weeks 3. Stool Hemoccults High-fiber diet Recall colonoscopy pending path report Possible explanation for heme positive stool was a right colon polyp which appeared to be ulcerated, we'll do recheck stool Hemoccults   eSigned:  Lafayette Dragon, MD 03/12/2013 12:01 PM   cc:   PATIENT NAME:  Anna Marquez, Anna Marquez MR#: [id number]

## 2013-03-12 NOTE — Op Note (Signed)
[facility]  [address]. [place], [id number]   ENDOSCOPY PROCEDURE REPORT  PATIENT: Anna, Marquez  MR#: [id number] BIRTHDATE: 12-01-1950 , 71  yrs. old GENDER: Female ENDOSCOPIST: Lafayette Dragon, MD REFERRED BY:  Dorena Cookey, M.D. PROCEDURE DATE:  03/12/2013 PROCEDURE:  EGD, diagnostic , last EGD 2012- hiatal hernia and hyperplastic polyps ASA CLASS:     Class II INDICATIONS:  Occult blood positive. MEDICATIONS: MAC sedation, administered by CRNA and propofol (Diprivan) 150mg  IV TOPICAL ANESTHETIC: Cetacaine Spray  DESCRIPTION OF PROCEDURE: After the risks benefits and alternatives of the procedure were thoroughly explained, informed consent was obtained.  The LB [id number] endoscope was introduced through the mouth and advanced to the second portion of the duodenum. Without limitations.  The instrument was slowly withdrawn as the mucosa was fully examined.      Esophagus, esophageal mucosa appeared normal in proximal mid and distal esophagus. Z line was normal. There was no stricture or esophagitis. There was small reducible hiatal hernia which measured 2 cm Stomach: The gastric mucosa appeared normal. There were normal rugal folds. There was normal gastric antrum and pyloric outlet. Retroflexion of the endoscope revealed normal fundus and cardia Duodenum: Duodenal bulb and descending duodenum was normal[ The scope was then withdrawn from the patient and the procedure completed.  COMPLICATIONS: There were no complications. ENDOSCOPIC IMPRESSION: essentially normal upper endoscopy of esophagus stomach and duodenum no evidence of polyps. Small reducible 2 cm hiatal hernia  RECOMMENDATIONS: 1.  Anti-reflux regimen to be follow 2.  Continue PPI 3.   proceed with colonoscopy  REPEAT EXAM: no  eSigned:  Lafayette Dragon, MD 03/12/2013 11:47 AM   CC:  PATIENT NAME:  Anna, Marquez MR#: [id number]

## 2013-03-12 NOTE — Patient Instructions (Addendum)
YOU HAD AN ENDOSCOPIC PROCEDURE TODAY AT THE [facility]: Refer to the procedure report that was given to you for any specific questions about what was found during the examination.  If the procedure report does not answer your questions, please call your gastroenterologist to clarify.  If you requested that your care partner not be given the details of your procedure findings, then the procedure report has been included in a sealed envelope for you to review at your convenience later.  YOU SHOULD EXPECT: Some feelings of bloating in the abdomen. Passage of more gas than usual.  Walking can help get rid of the air that was put into your GI tract during the procedure and reduce the bloating. If you had a lower endoscopy (such as a colonoscopy or flexible sigmoidoscopy) you may notice spotting of blood in your stool or on the toilet paper. If you underwent a bowel prep for your procedure, then you may not have a normal bowel movement for a few days.  DIET: Your first meal following the procedure should be a light meal and then it is ok to progress to your normal diet.  A half-sandwich or bowl of soup is an example of a good first meal.  Heavy or fried foods are harder to digest and may make you feel nauseous or bloated.  Likewise meals heavy in dairy and vegetables can cause extra gas to form and this can also increase the bloating.  Drink plenty of fluids but you should avoid alcoholic beverages for 24 hours.  ACTIVITY: Your care partner should take you home directly after the procedure.  You should plan to take it easy, moving slowly for the rest of the day.  You can resume normal activity the day after the procedure however you should NOT DRIVE or use heavy machinery for 24 hours (because of the sedation medicines used during the test).    SYMPTOMS TO REPORT IMMEDIATELY: A gastroenterologist can be reached at any hour.  During normal business hours, 8:30 AM to 5:00 PM Monday through Friday,  call [phone].  After hours and on weekends, please call the GI answering service at [phone] who will take a message and have the physician on call contact you.   Following lower endoscopy (colonoscopy or flexible sigmoidoscopy):  Excessive amounts of blood in the stool  Significant tenderness or worsening of abdominal pains  Swelling of the abdomen that is new, acute  Fever of 100F or higher  Following upper endoscopy (EGD)  Vomiting of blood or coffee ground material  New chest pain or pain under the shoulder blades  Painful or persistently difficult swallowing  New shortness of breath  Fever of 100F or higher  Black, tarry-looking stools  FOLLOW UP: If any biopsies were taken you will be contacted by phone or by letter within the next 1-3 weeks.  Call your gastroenterologist if you have not heard about the biopsies in 3 weeks.  Our staff will call the home number listed on your records the next business day following your procedure to check on you and address any questions or concerns that you may have at that time regarding the information given to you following your procedure. This is a courtesy call and so if there is no answer at the home number and we have not heard from you through the emergency physician on call, we will assume that you have returned to your regular daily activities without incident.  SIGNATURES/CONFIDENTIALITY: You and/or your care   partner have signed paperwork which will be entered into your electronic medical record.  These signatures attest to the fact that that the information above on your After Visit Summary has been reviewed and is understood.  Full responsibility of the confidentiality of this discharge information lies with you and/or your care-partner.  Endoscopy- small hiatal hernia, otherwise normal.  Continue PPI.    Colonoscopy- 2 polyps removed and sent to pathology                         No aspirin or NSAIDS for 2 weeks                          Repeat stool hemoccults cards

## 2013-03-12 NOTE — Progress Notes (Signed)
Called to room to assist during endoscopic procedure.  Patient ID and intended procedure confirmed with present staff. Received instructions for my participation in the procedure from the performing physician.  

## 2013-03-15 ENCOUNTER — Telehealth: Payer: Self-pay

## 2013-03-15 ENCOUNTER — Telehealth: Payer: Self-pay | Admitting: *Deleted

## 2013-03-15 DIAGNOSIS — K921 Melena: Secondary | ICD-10-CM

## 2013-03-15 NOTE — Telephone Encounter (Signed)
Left a message for patient to call me. (want to be sure she has hemoccult cards )

## 2013-03-15 NOTE — Telephone Encounter (Signed)
  Follow up Call-  Call back number 03/12/2013  Post procedure Call Back phone  # [phone]  Permission to leave phone message Yes     Patient questions:  Do you have a fever, pain , or abdominal swelling? no Pain Score  0 *  Have you tolerated food without any problems? yes  Have you been able to return to your normal activities? yes  Do you have any questions about your discharge instructions: Diet   no Medications  no Follow up visit  no  Do you have questions or concerns about your Care? no  Actions: * If pain score is 4 or above: No action needed, pain <4.   No problems per the pt. Maw

## 2013-03-16 ENCOUNTER — Encounter: Payer: Self-pay | Admitting: Internal Medicine

## 2013-03-18 ENCOUNTER — Encounter: Payer: Self-pay | Admitting: *Deleted

## 2013-03-18 ENCOUNTER — Other Ambulatory Visit: Payer: Self-pay | Admitting: Family Medicine

## 2013-03-23 ENCOUNTER — Encounter: Payer: Self-pay | Admitting: Internal Medicine

## 2013-04-07 ENCOUNTER — Telehealth: Payer: Self-pay

## 2013-04-07 NOTE — Telephone Encounter (Signed)
Patient called stating she needs to cancel her surgery scheduled for 04/19/13 7:30am at [facility].  She stated she had some "personal Issues" as well as "unexpected expenses" that was going to make it impossible for her to do surgery at this time .  She wants to plan on rescheduling in September as she has a summer job obligation.  I told her I will call her in June or July as soon as I get the July schedule and we will revisit this and get her scheduled if that is still her desire.  Surgery and office visit cancelled per pt request.

## 2013-04-07 NOTE — Telephone Encounter (Signed)
Ok

## 2013-04-14 ENCOUNTER — Other Ambulatory Visit: Payer: BC Managed Care – PPO

## 2013-04-14 ENCOUNTER — Ambulatory Visit: Payer: BC Managed Care – PPO | Admitting: Gynecology

## 2013-04-19 ENCOUNTER — Ambulatory Visit (HOSPITAL_COMMUNITY): Admission: RE | Admit: 2013-04-19 | Payer: BC Managed Care – PPO | Source: Ambulatory Visit | Admitting: Gynecology

## 2013-04-19 ENCOUNTER — Encounter (HOSPITAL_COMMUNITY): Admission: RE | Payer: Self-pay | Source: Ambulatory Visit

## 2013-04-19 SURGERY — HYSTERECTOMY, VAGINAL
Anesthesia: General

## 2013-04-20 ENCOUNTER — Encounter: Payer: Self-pay | Admitting: *Deleted

## 2013-05-17 ENCOUNTER — Encounter: Payer: Self-pay | Admitting: Cardiovascular Disease

## 2013-06-07 ENCOUNTER — Ambulatory Visit: Payer: BC Managed Care – PPO | Admitting: Cardiovascular Disease

## 2013-06-11 ENCOUNTER — Ambulatory Visit: Payer: BC Managed Care – PPO | Admitting: Cardiovascular Disease

## 2013-07-14 ENCOUNTER — Telehealth: Payer: Self-pay

## 2013-07-14 NOTE — Telephone Encounter (Signed)
I called patient and left message that I now have the Sept surgery schedule and would be happy to schedule her if she is ready. Asked her to call me.

## 2013-08-24 ENCOUNTER — Telehealth: Payer: Self-pay | Admitting: Family Medicine

## 2013-08-24 NOTE — Telephone Encounter (Signed)
Pt is currently on nexium, pt states she does not have insurance, pt is wanting a new rx for protonix because it is much cheaper. Send to [facility] on [address].

## 2013-08-25 MED ORDER — PANTOPRAZOLE SODIUM 40 MG PO TBEC: 40.0000 mg | DELAYED_RELEASE_TABLET | Freq: Every day | ORAL | Status: DC

## 2013-11-08 ENCOUNTER — Encounter: Payer: Self-pay | Admitting: Internal Medicine

## 2013-12-30 ENCOUNTER — Other Ambulatory Visit: Payer: Self-pay | Admitting: Family Medicine

## 2014-03-04 ENCOUNTER — Other Ambulatory Visit: Payer: Self-pay | Admitting: Family Medicine

## 2014-04-07 ENCOUNTER — Telehealth: Payer: Self-pay

## 2014-04-07 NOTE — Telephone Encounter (Signed)
Surgery would be TVH BSO anterior and posterior repair. Okay to move toward scheduling

## 2014-04-07 NOTE — Telephone Encounter (Signed)
Patient called stating she was scheduled for surgery last year but was not able to because of financial reasons.  Ready to schedule now and wanted to see what she needed to do to move forward with that.  She is overdue for annual exam as she last saw you on 03/09/13.  Please advise if you want me to go ahead and scheudule surgery while she is in process of getting in.  (She now has Medicare/BCBS.)

## 2014-04-08 NOTE — Telephone Encounter (Signed)
I spoke with patient and reminded her she is overdue for annual. She has Medicare now and has her physical scheduled with Dr. Sherren Mocha in May.  She had planned to let him do her gyn exam as well.  We discussed that since going to have surgery that she might let Dr. Moshe Salisbury do that exam this year and after that if she wants Dr. Sherren Mocha to do it she could.  She scheduled her CE here.   We discussed surgery date for May 17 possibly.  I am waiting for another patient to decide about that date and she is to let me know on Monday.  I will call patient back on Monday to discuss date.

## 2014-04-08 NOTE — Telephone Encounter (Signed)
CE scheduled 04/19/14.

## 2014-04-19 ENCOUNTER — Ambulatory Visit (INDEPENDENT_AMBULATORY_CARE_PROVIDER_SITE_OTHER): Payer: Medicare Other | Admitting: Gynecology

## 2014-04-19 ENCOUNTER — Encounter: Payer: Self-pay | Admitting: Gynecology

## 2014-04-19 VITALS — BP 130/84 | Ht 64.0 in | Wt 169.0 lb

## 2014-04-19 DIAGNOSIS — N814 Uterovaginal prolapse, unspecified: Secondary | ICD-10-CM | POA: Diagnosis not present

## 2014-04-19 DIAGNOSIS — N8111 Cystocele, midline: Secondary | ICD-10-CM | POA: Diagnosis not present

## 2014-04-19 DIAGNOSIS — Z01419 Encounter for gynecological examination (general) (routine) without abnormal findings: Secondary | ICD-10-CM | POA: Diagnosis not present

## 2014-04-19 DIAGNOSIS — N816 Rectocele: Secondary | ICD-10-CM

## 2014-04-19 MED ORDER — ESTRADIOL 0.1 MG/GM VA CREA: 1.0000 | TOPICAL_CREAM | Freq: Every day | VAGINAL | Status: DC

## 2014-04-19 NOTE — Progress Notes (Signed)
Anna Marquez [birth year] [id number]        64 y.o.  G2P2 for breast and pelvic exam as well as follow up on her uterine prolapse which is getting worse as discussed below.  Past medical history,surgical history, problem list, medications, allergies, family history and social history were all reviewed and documented as reviewed in the EPIC chart.  ROS:  Performed with pertinent positives and negatives included in the history, assessment and plan.   Additional significant findings :  none   Exam: [name] Counsellor Vitals:   04/19/14 1525  BP: 130/84  Height: 5\' 4"  (1.626 m)  Weight: 169 lb (76.658 kg)   General appearance:  Normal affect, orientation and appearance. Skin: Grossly normal HEENT: Without gross lesions.  No cervical or supraclavicular adenopathy. Thyroid normal.  Lungs:  Clear without wheezing, rales or rhonchi Cardiac: RR, without RMG Abdominal:  Soft, nontender, without masses, guarding, rebound, organomegaly or hernia Breasts:  Examined lying and sitting without masses, retractions, discharge or axillary adenopathy. Pelvic:  Ext/BUS/vagina with generalized atrophic changes. Second and third degree uterine prolapse with cervix protruding from the introitus. Associated cystocele and rectocele. Compartmentalized examination appears cuff otherwise well supported  Cervix atrophic with os clearly identified  Uterus normal size, shape and contour, midline and mobile nontender   Adnexa  Without masses or tenderness    Anus and perineum  Normal   Rectovaginal  Normal sphincter tone without palpated masses or tenderness.    Assessment/Plan:  64 y.o. G2P2 female for breast and pelvic exam..   1. Uterine prolapse/cystocele/rectocele.  Worse now than it was last year when I saw her. Patient is doing a lot of lifting with her mother who is in a wheelchair and she knows this has exacerbated her prolapse.  I again reviewed options to include pessary and surgery. Patient has  thought about it and wants to proceed with surgery. We talked last year about TVH BSO anterior and posterior colporrhaphy. Her vaginal cuff actually appears to be fairly well supported despite the prolapse. I discussed the issues of prophylactic vault suspension which I do not think that she needs. She is not having urinary symptoms such as stress or incontinence. Is followed by Dr. Risa Grill in urology for her renal lithiasis and he did not feel urologic surgery was necessary per her history. I again discussed with her the expected intraoperative postoperative courses and recovery period. I reviewed the issues of salpingo-oophorectomy if we could achieve all surgery vaginally Shimek a separate incision laparoscopically or larger to remove her ovaries if they appear to be normal at the time of surgery and we will again discuss this preoperatively. I gave her prescription for Estrace vaginal cream to start 3 times weekly preoperatively go ahead and get her scheduled for surgery and she'll follow up for a full preoperative appointment. 2. Scabbed area left upper mid buttocks. Patient relates this happens several times yearly to start with a cluster of small vesicles and then breakdown into this crusted area. Was told at an urgent care this was an insect bite. Sounds suspicious for viral either HSV or shingles. Options to try to culture this, Valtrex treatment for symptomatic treatment with antibiotic ointment reviewed. As this is an occasional occurrence she's going to continue with using anabiotic ointment intermittently as needed. If it becomes more consistent issue will rediscuss possible Valtrex. 3. Mammography overdue and patient knows to schedule this and agrees to do so.  SBE monthly reviewed.  4. Pap smear 2015. No  Pap smear done today. 5. Colonoscopy 2015. Repeat at their recommended interval. 6. DEXA many years ago. Suggest baseline DEXA now she is turning 64 and she agrees to do so. 7. Health  maintenance. No routine blood work done as she has this done at her primary physician's office. Follow up for her preoperative appointment and subsequent surgery is arranged.     Anastasio Auerbach MD, 4:33 PM 04/19/2014

## 2014-04-19 NOTE — Patient Instructions (Signed)
Follow up for bone density is scheduled. Office will contact you to arrange for surgery.  You may obtain a copy of any labs that were done today by logging onto MyChart as outlined in the instructions provided with your AVS (after visit summary). The office will not call with normal lab results but certainly if there are any significant abnormalities then we will contact you.   Health Maintenance, Female A healthy lifestyle and preventative care can promote health and wellness.  Maintain regular health, dental, and eye exams.  Eat a healthy diet. Foods like vegetables, fruits, whole grains, low-fat dairy products, and lean protein foods contain the nutrients you need without too many calories. Decrease your intake of foods high in solid fats, added sugars, and salt. Get information about a proper diet from your caregiver, if necessary.  Regular physical exercise is one of the most important things you can do for your health. Most adults should get at least 150 minutes of moderate-intensity exercise (any activity that increases your heart rate and causes you to sweat) each week. In addition, most adults need muscle-strengthening exercises on 2 or more days a week.   Maintain a healthy weight. The body mass index (BMI) is a screening tool to identify possible weight problems. It provides an estimate of body fat based on height and weight. Your caregiver can help determine your BMI, and can help you achieve or maintain a healthy weight. For adults 20 years and older:  A BMI below 18.5 is considered underweight.  A BMI of 18.5 to 24.9 is normal.  A BMI of 25 to 29.9 is considered overweight.  A BMI of 30 and above is considered obese.  Maintain normal blood lipids and cholesterol by exercising and minimizing your intake of saturated fat. Eat a balanced diet with plenty of fruits and vegetables. Blood tests for lipids and cholesterol should begin at age 64 and be repeated every 5 years. If your  lipid or cholesterol levels are high, you are over 50, or you are a high risk for heart disease, you may need your cholesterol levels checked more frequently.Ongoing high lipid and cholesterol levels should be treated with medicines if diet and exercise are not effective.  If you smoke, find out from your caregiver how to quit. If you do not use tobacco, do not start.  Lung cancer screening is recommended for adults aged 54 80 years who are at high risk for developing lung cancer because of a history of smoking. Yearly low-dose computed tomography (CT) is recommended for people who have at least a 30-pack-year history of smoking and are a current smoker or have quit within the past 15 years. A pack year of smoking is smoking an average of 1 pack of cigarettes a day for 1 year (for example: 1 pack a day for 30 years or 2 packs a day for 15 years). Yearly screening should continue until the smoker has stopped smoking for at least 15 years. Yearly screening should also be stopped for people who develop a health problem that would prevent them from having lung cancer treatment.  If you are pregnant, do not drink alcohol. If you are breastfeeding, be very cautious about drinking alcohol. If you are not pregnant and choose to drink alcohol, do not exceed 1 drink per day. One drink is considered to be 12 ounces (355 mL) of beer, 5 ounces (148 mL) of wine, or 1.5 ounces (44 mL) of liquor.  Avoid use of street drugs. Do  not share needles with anyone. Ask for help if you need support or instructions about stopping the use of drugs.  High blood pressure causes heart disease and increases the risk of stroke. Blood pressure should be checked at least every 1 to 2 years. Ongoing high blood pressure should be treated with medicines, if weight loss and exercise are not effective.  If you are 62 to 65 years old, ask your caregiver if you should take aspirin to prevent strokes.  Diabetes screening involves taking a  blood sample to check your fasting blood sugar level. This should be done once every 3 years, after age 68, if you are within normal weight and without risk factors for diabetes. Testing should be considered at a younger age or be carried out more frequently if you are overweight and have at least 1 risk factor for diabetes.  Breast cancer screening is essential preventative care for women. You should practice "breast self-awareness." This means understanding the normal appearance and feel of your breasts and may include breast self-examination. Any changes detected, no matter how small, should be reported to a caregiver. Women in their 79s and 30s should have a clinical breast exam (CBE) by a caregiver as part of a regular health exam every 1 to 3 years. After age 32, women should have a CBE every year. Starting at age 78, women should consider having a mammogram (breast X-ray) every year. Women who have a family history of breast cancer should talk to their caregiver about genetic screening. Women at a high risk of breast cancer should talk to their caregiver about having an MRI and a mammogram every year.  Breast cancer gene (BRCA)-related cancer risk assessment is recommended for women who have family members with BRCA-related cancers. BRCA-related cancers include breast, ovarian, tubal, and peritoneal cancers. Having family members with these cancers may be associated with an increased risk for harmful changes (mutations) in the breast cancer genes BRCA1 and BRCA2. Results of the assessment will determine the need for genetic counseling and BRCA1 and BRCA2 testing.  The Pap test is a screening test for cervical cancer. Women should have a Pap test starting at age 63. Between ages 17 and 63, Pap tests should be repeated every 2 years. Beginning at age 65, you should have a Pap test every 3 years as long as the past 3 Pap tests have been normal. If you had a hysterectomy for a problem that was not cancer or  a condition that could lead to cancer, then you no longer need Pap tests. If you are between ages 61 and 33, and you have had normal Pap tests going back 10 years, you no longer need Pap tests. If you have had past treatment for cervical cancer or a condition that could lead to cancer, you need Pap tests and screening for cancer for at least 20 years after your treatment. If Pap tests have been discontinued, risk factors (such as a new sexual partner) need to be reassessed to determine if screening should be resumed. Some women have medical problems that increase the chance of getting cervical cancer. In these cases, your caregiver may recommend more frequent screening and Pap tests.  The human papillomavirus (HPV) test is an additional test that may be used for cervical cancer screening. The HPV test looks for the virus that can cause the cell changes on the cervix. The cells collected during the Pap test can be tested for HPV. The HPV test could be used to  screen women aged 23 years and older, and should be used in women of any age who have unclear Pap test results. After the age of 54, women should have HPV testing at the same frequency as a Pap test.  Colorectal cancer can be detected and often prevented. Most routine colorectal cancer screening begins at the age of 70 and continues through age 42. However, your caregiver may recommend screening at an earlier age if you have risk factors for colon cancer. On a yearly basis, your caregiver may provide home test kits to check for hidden blood in the stool. Use of a small camera at the end of a tube, to directly examine the colon (sigmoidoscopy or colonoscopy), can detect the earliest forms of colorectal cancer. Talk to your caregiver about this at age 41, when routine screening begins. Direct examination of the colon should be repeated every 5 to 10 years through age 51, unless early forms of pre-cancerous polyps or small growths are found.  Hepatitis C  blood testing is recommended for all people born from [birth year] through 1966 and any individual with known risks for hepatitis C.  Practice safe sex. Use condoms and avoid high-risk sexual practices to reduce the spread of sexually transmitted infections (STIs). Sexually active women aged 84 and younger should be checked for Chlamydia, which is a common sexually transmitted infection. Older women with new or multiple partners should also be tested for Chlamydia. Testing for other STIs is recommended if you are sexually active and at increased risk.  Osteoporosis is a disease in which the bones lose minerals and strength with aging. This can result in serious bone fractures. The risk of osteoporosis can be identified using a bone density scan. Women ages 63 and over and women at risk for fractures or osteoporosis should discuss screening with their caregivers. Ask your caregiver whether you should be taking a calcium supplement or vitamin D to reduce the rate of osteoporosis.  Menopause can be associated with physical symptoms and risks. Hormone replacement therapy is available to decrease symptoms and risks. You should talk to your caregiver about whether hormone replacement therapy is right for you.  Use sunscreen. Apply sunscreen liberally and repeatedly throughout the day. You should seek shade when your shadow is shorter than you. Protect yourself by wearing long sleeves, pants, a wide-brimmed hat, and sunglasses year round, whenever you are outdoors.  Notify your caregiver of new moles or changes in moles, especially if there is a change in shape or color. Also notify your caregiver if a mole is larger than the size of a pencil eraser.  Stay current with your immunizations. Document Released: 07/09/2010 Document Revised: 04/20/2012 Document Reviewed: 07/09/2010 [facility] Patient Information 2014 [place].

## 2014-04-20 ENCOUNTER — Encounter: Payer: Self-pay | Admitting: Gynecology

## 2014-04-20 ENCOUNTER — Telehealth: Payer: Self-pay

## 2014-04-20 LAB — URINALYSIS W MICROSCOPIC + REFLEX CULTURE
Bilirubin Urine: NEGATIVE
CASTS: NONE SEEN
Glucose, UA: NEGATIVE mg/dL
HGB URINE DIPSTICK: NEGATIVE
KETONES UR: NEGATIVE mg/dL
LEUKOCYTES UA: NEGATIVE
Nitrite: NEGATIVE
PH: 5.5 (ref 5.0–8.0)
Protein, ur: NEGATIVE mg/dL
SPECIFIC GRAVITY, URINE: 1.018 (ref 1.005–1.030)
UROBILINOGEN UA: 0.2 mg/dL (ref 0.0–1.0)

## 2014-04-20 NOTE — Telephone Encounter (Signed)
I spoke with patient and scheduled her TVH, BSO and A&P Repair for 06/14/14 at 10:00am at [facility].  We discussed her insurance benefits and there is not upfront financial responsibility due to [facility] as ins indicated it pays 100% for providers fees for surgery.    Patient was advised Dr. Loetta Rough would like her to get medical clearance from her PCP. She tells me that is Dr. Sherren Mocha. I called his office and left message for CMA Apolonio Schneiders to let me know how we proceed to get this medical clearance.  Patient was reminded and encouraged to use the Estrace Vaginal Cream 3 times weekly pre-operatively.  Pre op consult appt was scheduled with Dr. Loetta Rough.

## 2014-04-28 ENCOUNTER — Ambulatory Visit (INDEPENDENT_AMBULATORY_CARE_PROVIDER_SITE_OTHER): Payer: Medicare Other

## 2014-04-28 ENCOUNTER — Other Ambulatory Visit: Payer: Self-pay | Admitting: Gynecology

## 2014-04-28 DIAGNOSIS — Z01419 Encounter for gynecological examination (general) (routine) without abnormal findings: Secondary | ICD-10-CM

## 2014-04-28 DIAGNOSIS — M8580 Other specified disorders of bone density and structure, unspecified site: Secondary | ICD-10-CM

## 2014-04-28 DIAGNOSIS — Z13820 Encounter for screening for osteoporosis: Secondary | ICD-10-CM | POA: Diagnosis not present

## 2014-04-28 HISTORY — DX: Other specified disorders of bone density and structure, unspecified site: M85.80

## 2014-04-29 ENCOUNTER — Encounter: Payer: Self-pay | Admitting: Gynecology

## 2014-05-02 ENCOUNTER — Ambulatory Visit (INDEPENDENT_AMBULATORY_CARE_PROVIDER_SITE_OTHER): Payer: Medicare Other | Admitting: Family Medicine

## 2014-05-02 ENCOUNTER — Encounter: Payer: Self-pay | Admitting: Family Medicine

## 2014-05-02 VITALS — BP 120/80 | Temp 98.8°F | Wt 171.0 lb

## 2014-05-02 DIAGNOSIS — Z0000 Encounter for general adult medical examination without abnormal findings: Secondary | ICD-10-CM

## 2014-05-02 DIAGNOSIS — Z01810 Encounter for preprocedural cardiovascular examination: Secondary | ICD-10-CM

## 2014-05-02 DIAGNOSIS — I10 Essential (primary) hypertension: Secondary | ICD-10-CM | POA: Diagnosis not present

## 2014-05-02 DIAGNOSIS — G43009 Migraine without aura, not intractable, without status migrainosus: Secondary | ICD-10-CM

## 2014-05-02 DIAGNOSIS — Z23 Encounter for immunization: Secondary | ICD-10-CM | POA: Diagnosis not present

## 2014-05-02 DIAGNOSIS — R946 Abnormal results of thyroid function studies: Secondary | ICD-10-CM | POA: Diagnosis not present

## 2014-05-02 DIAGNOSIS — I340 Nonrheumatic mitral (valve) insufficiency: Secondary | ICD-10-CM

## 2014-05-02 DIAGNOSIS — N814 Uterovaginal prolapse, unspecified: Secondary | ICD-10-CM

## 2014-05-02 DIAGNOSIS — R7989 Other specified abnormal findings of blood chemistry: Secondary | ICD-10-CM

## 2014-05-02 LAB — CBC WITH DIFFERENTIAL/PLATELET
BASOS ABS: 0.1 10*3/uL (ref 0.0–0.1)
Basophils Relative: 0.7 % (ref 0.0–3.0)
Eosinophils Absolute: 0.2 10*3/uL (ref 0.0–0.7)
Eosinophils Relative: 2.4 % (ref 0.0–5.0)
HEMATOCRIT: 38.2 % (ref 36.0–46.0)
Hemoglobin: 13.3 g/dL (ref 12.0–15.0)
LYMPHS ABS: 2 10*3/uL (ref 0.7–4.0)
Lymphocytes Relative: 24.2 % (ref 12.0–46.0)
MCHC: 34.8 g/dL (ref 30.0–36.0)
MCV: 87.1 fl (ref 78.0–100.0)
Monocytes Absolute: 0.6 10*3/uL (ref 0.1–1.0)
Monocytes Relative: 6.9 % (ref 3.0–12.0)
Neutro Abs: 5.6 10*3/uL (ref 1.4–7.7)
Neutrophils Relative %: 65.8 % (ref 43.0–77.0)
Platelets: 182 10*3/uL (ref 150.0–400.0)
RBC: 4.39 Mil/uL (ref 3.87–5.11)
RDW: 13.5 % (ref 11.5–15.5)
WBC: 8.5 10*3/uL (ref 4.0–10.5)

## 2014-05-02 LAB — LIPID PANEL
CHOL/HDL RATIO: 3
CHOLESTEROL: 154 mg/dL (ref 0–200)
HDL: 48.2 mg/dL (ref >39.00)
NONHDL: 105.8
Triglycerides: 216 mg/dL — ABNORMAL HIGH (ref 0.0–149.0)
VLDL: 43.2 mg/dL — ABNORMAL HIGH (ref 0.0–40.0)

## 2014-05-02 LAB — BASIC METABOLIC PANEL
BUN: 14 mg/dL (ref 6–23)
CHLORIDE: 105 meq/L (ref 96–112)
CO2: 27 mEq/L (ref 19–32)
Calcium: 9.9 mg/dL (ref 8.4–10.5)
Creatinine, Ser: 0.72 mg/dL (ref 0.40–1.20)
GFR: 86.34 mL/min (ref >60.00)
Glucose, Bld: 74 mg/dL (ref 70–99)
Potassium: 4 mEq/L (ref 3.5–5.1)
SODIUM: 139 meq/L (ref 135–145)

## 2014-05-02 LAB — HEPATIC FUNCTION PANEL
ALT: 14 U/L (ref 0–35)
AST: 14 U/L (ref 0–37)
Albumin: 4.4 g/dL (ref 3.5–5.2)
Alkaline Phosphatase: 56 U/L (ref 39–117)
BILIRUBIN TOTAL: 1.9 mg/dL — AB (ref 0.2–1.2)
Bilirubin, Direct: 0.3 mg/dL (ref 0.0–0.3)
TOTAL PROTEIN: 7.5 g/dL (ref 6.0–8.3)

## 2014-05-02 LAB — TSH: TSH: 0.04 u[IU]/mL — ABNORMAL LOW (ref 0.35–4.50)

## 2014-05-02 LAB — LDL CHOLESTEROL, DIRECT: Direct LDL: 81 mg/dL

## 2014-05-02 MED ORDER — NADOLOL 40 MG PO TABS: 40.0000 mg | ORAL_TABLET | Freq: Two times a day (BID) | ORAL | Status: DC

## 2014-05-02 MED ORDER — PANTOPRAZOLE SODIUM 40 MG PO TBEC: 40.0000 mg | DELAYED_RELEASE_TABLET | Freq: Every day | ORAL | Status: DC

## 2014-05-02 MED ORDER — HYDROCHLOROTHIAZIDE 25 MG PO TABS: 25.0000 mg | ORAL_TABLET | Freq: Every day | ORAL | Status: DC | PRN

## 2014-05-02 MED ORDER — LISINOPRIL 5 MG PO TABS: 5.0000 mg | ORAL_TABLET | Freq: Every day | ORAL | Status: DC

## 2014-05-02 NOTE — Progress Notes (Signed)
   Subjective:    Patient ID: Anna Marquez, female    DOB: 12/21/1950, 64 y.o.   MRN: [id number]  HPI [name] is a 64 year old single female nonsmoker who comes in today for general physical examination because of a history of hypertension, migraine headaches, mitral valve prolapse, reflux esophagitis, and prolapse of her uterus.  Her blood pressures 120/80 on lisinopril 5 mg daily, hydrochlorothiazide 25 mg daily.  She takes Corgard 40 mg twice a day because of a history of mitral valve prolapse.  She takes Protonix daily for reflux and Maxalt when necessary for migraines. However she's not had a migraines in the past 12 months.  I referred to Dr. Abbie Sons for evaluation of uterine prolapse. She's due to have a hysterectomy on June 7 of this year. I see no contraindication to her surgery.  She gets routine eye care, dental care, tetanus booster 2009, do a shingles vaccine.  She gets yearly mammogram and a colonoscopy done March 2015. History of colon polyps.  She's also had a history of A. fib currently in sinus rhythm   Review of Systems  Constitutional: Negative.   HENT: Negative.   Eyes: Negative.   Respiratory: Negative.   Cardiovascular: Negative.   Gastrointestinal: Negative.   Endocrine: Negative.   Genitourinary: Negative.   Musculoskeletal: Negative.   Skin: Negative.   Allergic/Immunologic: Negative.   Neurological: Negative.   Hematological: Negative.   Psychiatric/Behavioral: Negative.        Objective:   Physical Exam  Constitutional: She appears well-developed and well-nourished.  HENT:  Head: Normocephalic and atraumatic.  Right Ear: External ear normal.  Left Ear: External ear normal.  Nose: Nose normal.  Mouth/Throat: Oropharynx is clear and moist.  Eyes: EOM are normal. Pupils are equal, round, and reactive to light.  Neck: Normal range of motion. Neck supple. No JVD present. No tracheal deviation present. No thyromegaly present.    Cardiovascular: Normal rate, regular rhythm, normal heart sounds and intact distal pulses.  Exam reveals no gallop and no friction rub.   No murmur heard. No carotid neurologic bruits for full pulses 2+ and symmetrical  Pulmonary/Chest: Effort normal and breath sounds normal. No stridor. No respiratory distress. She has no wheezes. She has no rales. She exhibits no tenderness.  Abdominal: Soft. Bowel sounds are normal. She exhibits no distension and no mass. There is no tenderness. There is no rebound and no guarding.  Musculoskeletal: Normal range of motion.  Lymphadenopathy:    She has no cervical adenopathy.  Neurological: She is alert. She has normal reflexes. No cranial nerve deficit. She exhibits normal muscle tone. Coordination normal.  Skin: Skin is warm and dry. No rash noted. No erythema. No pallor.  Psychiatric: She has a normal mood and affect. Her behavior is normal. Judgment and thought content normal.  Nursing note and vitals reviewed.         Assessment & Plan:  Healthy female  Hypertension at goal continue current therapy  History of mitral valve prolapse continue Corgard 40 mg twice a day  Reflux esophagitis continue proton X 40 mg daily  Maxalt when necessary for migraine headaches  Uterine prolapse.......... surgery didn't June 7 by Dr. [name]

## 2014-05-02 NOTE — Patient Instructions (Signed)
Labs today,,,,,,,,,, we will call you the report if there is anything abnormal  Continue current meds  Follow-up in one year sooner if any problems  Rachel's extension is [phone]  Call your insurance company to find out where you can get the shingles vaccine  CoryN .......... is our new adult Designer, jewellery,

## 2014-05-02 NOTE — Progress Notes (Signed)
Pre visit review using our clinic review tool, if applicable. No additional management support is needed unless otherwise documented below in the visit note. 

## 2014-05-03 LAB — T3, FREE: T3 FREE: 4.2 pg/mL (ref 2.3–4.2)

## 2014-05-03 LAB — T4, FREE: Free T4: 1.44 ng/dL (ref 0.60–1.60)

## 2014-05-03 NOTE — Addendum Note (Signed)
Addended by: Donnita Falls on: 05/03/2014 09:36 AM   Modules accepted: Orders

## 2014-05-13 ENCOUNTER — Other Ambulatory Visit: Payer: Self-pay | Admitting: Gynecology

## 2014-05-13 DIAGNOSIS — Z1231 Encounter for screening mammogram for malignant neoplasm of breast: Secondary | ICD-10-CM

## 2014-05-23 ENCOUNTER — Ambulatory Visit (HOSPITAL_BASED_OUTPATIENT_CLINIC_OR_DEPARTMENT_OTHER)
Admission: RE | Admit: 2014-05-23 | Discharge: 2014-05-23 | Disposition: A | Discharge status: Home / Self Care | Payer: Medicare Other | Source: Ambulatory Visit | Attending: Gynecology | Admitting: Gynecology

## 2014-05-23 DIAGNOSIS — Z1231 Encounter for screening mammogram for malignant neoplasm of breast: Secondary | ICD-10-CM | POA: Diagnosis present

## 2014-05-30 ENCOUNTER — Telehealth: Payer: Self-pay

## 2014-05-30 MED ORDER — ESTRADIOL 0.1 MG/GM VA CREA: 1.0000 | TOPICAL_CREAM | Freq: Every day | VAGINAL | Status: DC

## 2014-05-30 NOTE — Telephone Encounter (Signed)
Shala at [facility] called stating they had lost patient's Estrace Rx in the conversion from [facility] to [facility].  She asked if we could send Rx. Rx sent.

## 2014-06-09 ENCOUNTER — Ambulatory Visit (INDEPENDENT_AMBULATORY_CARE_PROVIDER_SITE_OTHER): Payer: Medicare Other | Admitting: Gynecology

## 2014-06-09 ENCOUNTER — Encounter: Payer: Self-pay | Admitting: Gynecology

## 2014-06-09 VITALS — BP 120/76

## 2014-06-09 DIAGNOSIS — N816 Rectocele: Secondary | ICD-10-CM

## 2014-06-09 DIAGNOSIS — N814 Uterovaginal prolapse, unspecified: Secondary | ICD-10-CM

## 2014-06-09 DIAGNOSIS — N8111 Cystocele, midline: Secondary | ICD-10-CM | POA: Diagnosis not present

## 2014-06-09 NOTE — Progress Notes (Signed)
Anna Marquez 02-Sep-1950 [id number]   Preoperative consult  Chief complaint: Worsening symptoms from uterine prolapse, rectocele, cystocele  History of present illness: 64 y.o. G2P2 with several year history of uterine prolapse with associated cystocele and rectocele. This has not been significantly bothersome to her and she's been monitoring this but over the last 6 months her symptoms have progressively worsened where she now feels her cervix protruding from her vagina. She has been doing a lot of lifting assisting her mother who is in a wheelchair and feels that this has precipitated the worsening of her symptoms. She is not having significant issues with urinary incontinence. Options for management to include continued observation, pessary and surgery discussed and the patient elects for definitive surgery to include TVH, BSO, A&P repair.  Past medical history,surgical history, medications, allergies, family history and social history were all reviewed and documented in the EPIC chart.  ROS:  Was performed and pertinent positives and negatives are included in the history of present illness.  Exam:  [name] assistant General: well developed, well nourished female, no acute distress HEENT: normal  Lungs: clear to auscultation without wheezing, rales or rhonchi  Cardiac: regular rate without rubs, murmurs or gallops  Abdomen: soft, nontender without masses, guarding, rebound, organomegaly  Pelvic: external bus vagina: normal  With second to third degree uterine prolapse and associated first to second-degree cystocele and first to second-degree rectocele Cervix: grossly normal  Uterus: normal size, midline and mobile, nontender  Adnexa: without masses or tenderness  Rectovaginal exam within normal limits    Assessment/Plan:  64 y.o. G2P2 with worsening symptoms from her uterine prolapse, cystocele and rectocele. Options for management include observation, pessary and surgery discussed. Patient  wants to proceed with surgical correction and is admitted for TVH, BSO, A&P repair. Patient understands there are no guarantees for lifelong correction and that there can be a recurrence of her cystocele or rectocele as well as vaginal vault prolapse following this procedure and she may require future surgeries. The issues of mesh support also discussed and the risks to include erosion and long-term complications and whether to use this with a primary repair was also reviewed. I also reviewed the issues of vaginal scarring and healing following the procedure and that this could lead to difficulties with intercourse in the future and require dilatation. We discussed that there are no guarantees that I will be able to accomplish this vaginally and that any time during the procedure if I feel it is best or complications arise that I will convert this either to a laparoscopic approach or a total abdominal approach with a larger incision and longer recovery. The ovarian conservation issue was reviewed. Given her age the patient does desire both ovaries removed if possible at the time of surgery. I discussed with her that if I am unable to retrieve them vaginally would she want me to make a separate incision abdominally either laparoscopically or larger to remove them and she prefers if they appear normal/atrophic and I do not feel I can retrieve them safely vaginally that she prefers I leave them and she would accept the risk of ovarian disease and cancer in the future noting no family history of ovarian cancer. We also discussed I cannot guarantee that she would not develop hormone deficient symptoms after removal of her ovaries but that would be unlikely at the age of 79. Possibilities for HRT following surgery was discussed with her.  Sexuality following hysterectomy was also discussed and the potential for  persistent orgasmic dysfunction as well as dyspareunia or difficulty having intercourse was also reviewed.  The  expected intraoperative and postoperative courses as well as the recovery period was reviewed. The risks of infection, prolonged antibiotics, reoperation for abscess or hematoma formation was discussed. The risks of hemorrhage necessitating transfusion and the risks of transfusion reaction, hepatitis, HIV, mad cow disease and other unknown entities was also discussed. Incisional complications to include opening and draining of incisions and closure by secondary intention, dehiscence and long-term issues of keloid/cosmetics and hernia formation were reviewed. The risk of inadvertent injury to internal organs including bowel, bladder, ureters, vessels, nerves either immediately recognized or delay recognized necessitating major exploratory reparative surgeries and future reparative surgeries including bowel resection, ostomy formation, bladder repair, ureteral damage repair was discussed with her. I reviewed the anatomy with her using pictures and she understands the proximity of the bladder ureters and rectum and has a realistic understanding of the potential for damage to the structures in the need for repair.  The patient's questions were answered to her satisfaction and she is ready to proceed with surgery.     Anastasio Auerbach MD, 1:46 PM 06/09/2014

## 2014-06-09 NOTE — H&P (Signed)
Anna Marquez 05-Aug-1950 [id number]   History and Physical  Chief complaint: Worsening symptoms from uterine prolapse, rectocele, cystocele  History of present illness: 64 y.o. G2P2 with several year history of uterine prolapse with associated cystocele and rectocele. This has not been significantly bothersome to her and she's been monitoring this but over the last 6 months her symptoms have progressively worsened where she now feels her cervix protruding from her vagina. She has been doing a lot of lifting assisting her mother who is in a wheelchair and feels that this has precipitated the worsening of her symptoms. She is not having significant issues with urinary incontinence. Options for management to include continued observation, pessary and surgery discussed and the patient elects for definitive surgery to include TVH, BSO, A&P repair.  Past medical history,surgical history, medications, allergies, family history and social history were all reviewed and documented in the EPIC chart.  ROS:  Was performed and pertinent positives and negatives are included in the history of present illness.  Exam:  [name] assistant 06/09/2014 General: well developed, well nourished female, no acute distress HEENT: normal  Lungs: clear to auscultation without wheezing, rales or rhonchi  Cardiac: regular rate without rubs, murmurs or gallops  Abdomen: soft, nontender without masses, guarding, rebound, organomegaly  Pelvic: external bus vagina: normal  With second to third degree uterine prolapse and associated first to second-degree cystocele and first to second-degree rectocele Cervix: grossly normal  Uterus: normal size, midline and mobile, nontender  Adnexa: without masses or tenderness  Rectovaginal exam within normal limits    Assessment/Plan:  64 y.o. G2P2 with worsening symptoms from her uterine prolapse, cystocele and rectocele. Options for management include observation, pessary and surgery  discussed. Patient wants to proceed with surgical correction and is admitted for TVH, BSO, A&P repair. Patient understands there are no guarantees for lifelong correction and that there can be a recurrence of her cystocele or rectocele as well as vaginal vault prolapse following this procedure and she may require future surgeries. The issues of mesh support also discussed and the risks to include erosion and long-term complications and whether to use this with a primary repair was also reviewed. I also reviewed the issues of vaginal scarring and healing following the procedure and that this could lead to difficulties with intercourse in the future and require dilatation. We discussed that there are no guarantees that I will be able to accomplish this vaginally and that any time during the procedure if I feel it is best or complications arise that I will convert this either to a laparoscopic approach or a total abdominal approach with a larger incision and longer recovery. The ovarian conservation issue was reviewed. Given her age the patient does desire both ovaries removed if possible at the time of surgery. I discussed with her that if I am unable to retrieve them vaginally would she want me to make a separate incision abdominally either laparoscopically or larger to remove them and she prefers if they appear normal/atrophic and I do not feel I can retrieve them safely vaginally that she prefers I leave them and she would accept the risk of ovarian disease and cancer in the future noting no family history of ovarian cancer. We also discussed I cannot guarantee that she would not develop hormone deficient symptoms after removal of her ovaries but that would be unlikely at the age of 37. Possibilities for HRT following surgery was discussed with her.  Sexuality following hysterectomy was also discussed and the  potential for persistent orgasmic dysfunction as well as dyspareunia or difficulty having intercourse was  also reviewed.  The expected intraoperative and postoperative courses as well as the recovery period was reviewed. The risks of infection, prolonged antibiotics, reoperation for abscess or hematoma formation was discussed. The risks of hemorrhage necessitating transfusion and the risks of transfusion reaction, hepatitis, HIV, mad cow disease and other unknown entities was also discussed. Incisional complications to include opening and draining of incisions and closure by secondary intention, dehiscence and long-term issues of keloid/cosmetics and hernia formation were reviewed. The risk of inadvertent injury to internal organs including bowel, bladder, ureters, vessels, nerves either immediately recognized or delay recognized necessitating major exploratory reparative surgeries and future reparative surgeries including bowel resection, ostomy formation, bladder repair, ureteral damage repair was discussed with her. I reviewed the anatomy with her using pictures and she understands the proximity of the bladder ureters and rectum and has a realistic understanding of the potential for damage to the structures in the need for repair.  The patient's questions were answered to her satisfaction and she is ready to proceed with surgery.    Anastasio Auerbach MD, 2:01 PM 06/09/2014

## 2014-06-09 NOTE — Patient Instructions (Signed)
Followup for surgery as scheduled. 

## 2014-06-11 NOTE — Anesthesia Preprocedure Evaluation (Addendum)
Anesthesia Evaluation  Patient identified by MRN, date of birth, ID band Patient awake    Reviewed: Allergy & Precautions, NPO status , Patient's Chart, lab work & pertinent test results, reviewed documented beta blocker date and time   Airway Mallampati: II   Neck ROM: Full    Dental  (+) Teeth Intact, Dental Advisory Given   Pulmonary asthma ,  breath sounds clear to auscultation        Cardiovascular hypertension, Pt. on medications Rhythm:Regular  ECHO 2014 EF 60%, moderate MR, EKG OK   Neuro/Psych  Headaches,    GI/Hepatic GERD-  Medicated,  Endo/Other    Renal/GU stones     Musculoskeletal   Abdominal (+)  Abdomen: soft.    Peds  Hematology   Anesthesia Other Findings   Reproductive/Obstetrics                           Anesthesia Physical Anesthesia Plan  ASA: II  Anesthesia Plan: General   Post-op Pain Management:    Induction: Intravenous  Airway Management Planned: LMA and Oral ETT  Additional Equipment:   Intra-op Plan:   Post-operative Plan: Extubation in OR  Informed Consent: I have reviewed the patients History and Physical, chart, labs and discussed the procedure including the risks, benefits and alternatives for the proposed anesthesia with the patient or authorized representative who has indicated his/her understanding and acceptance.     Plan Discussed with:   Anesthesia Plan Comments:         Anesthesia Quick Evaluation

## 2014-06-13 ENCOUNTER — Encounter (HOSPITAL_COMMUNITY)
Admission: RE | Admit: 2014-06-13 | Discharge: 2014-06-13 | Disposition: A | Discharge status: Home / Self Care | Payer: Medicare Other | Source: Ambulatory Visit | Attending: Gynecology | Admitting: Gynecology

## 2014-06-13 ENCOUNTER — Encounter (HOSPITAL_COMMUNITY): Payer: Self-pay

## 2014-06-13 DIAGNOSIS — I10 Essential (primary) hypertension: Secondary | ICD-10-CM | POA: Diagnosis not present

## 2014-06-13 DIAGNOSIS — K219 Gastro-esophageal reflux disease without esophagitis: Secondary | ICD-10-CM | POA: Diagnosis not present

## 2014-06-13 DIAGNOSIS — N813 Complete uterovaginal prolapse: Secondary | ICD-10-CM | POA: Diagnosis not present

## 2014-06-13 DIAGNOSIS — D259 Leiomyoma of uterus, unspecified: Secondary | ICD-10-CM | POA: Diagnosis not present

## 2014-06-13 DIAGNOSIS — N814 Uterovaginal prolapse, unspecified: Secondary | ICD-10-CM | POA: Diagnosis present

## 2014-06-13 DIAGNOSIS — J45909 Unspecified asthma, uncomplicated: Secondary | ICD-10-CM | POA: Diagnosis not present

## 2014-06-13 LAB — COMPREHENSIVE METABOLIC PANEL
ALBUMIN: 4 g/dL (ref 3.5–5.0)
ALK PHOS: 52 U/L (ref 38–126)
ALT: 14 U/L (ref 14–54)
AST: 16 U/L (ref 15–41)
Anion gap: 4 — ABNORMAL LOW (ref 5–15)
BUN: 20 mg/dL (ref 6–20)
CO2: 24 mmol/L (ref 22–32)
Calcium: 9.1 mg/dL (ref 8.9–10.3)
Chloride: 110 mmol/L (ref 101–111)
Creatinine, Ser: 0.78 mg/dL (ref 0.44–1.00)
GFR calc Af Amer: >60 mL/min (ref >60)
Glucose, Bld: 95 mg/dL (ref 65–99)
POTASSIUM: 4.1 mmol/L (ref 3.5–5.1)
Sodium: 138 mmol/L (ref 135–145)
Total Bilirubin: 1.3 mg/dL — ABNORMAL HIGH (ref 0.3–1.2)
Total Protein: 7 g/dL (ref 6.5–8.1)

## 2014-06-13 LAB — CBC
HCT: 34.7 % — ABNORMAL LOW (ref 36.0–46.0)
Hemoglobin: 12.1 g/dL (ref 12.0–15.0)
MCH: 29.9 pg (ref 26.0–34.0)
MCHC: 34.9 g/dL (ref 30.0–36.0)
MCV: 85.7 fL (ref 78.0–100.0)
PLATELETS: 181 10*3/uL (ref 150–400)
RBC: 4.05 MIL/uL (ref 3.87–5.11)
RDW: 12.4 % (ref 11.5–15.5)
WBC: 5.3 10*3/uL (ref 4.0–10.5)

## 2014-06-13 MED ORDER — CIPROFLOXACIN IN D5W 400 MG/200ML IV SOLN
400.0000 mg | INTRAVENOUS | Status: AC
  Administered 2014-06-14: 400 mg via INTRAVENOUS
  Filled 2014-06-13: qty 200

## 2014-06-13 MED ORDER — METRONIDAZOLE IN NACL 5-0.79 MG/ML-% IV SOLN
500.0000 mg | INTRAVENOUS | Status: AC
  Administered 2014-06-14: 500 mg via INTRAVENOUS
  Filled 2014-06-13: qty 100

## 2014-06-13 NOTE — Patient Instructions (Signed)
Your procedure is scheduled on:06/14/14  Enter through the Main Entrance at :7:45 am Pick up desk phone and dial [phone] and inform us of your arrival.  Please call [phone] if you have any problems the morning of surgery.  Remember: Do not eat food or drink liquids, including water, after midnight:tonight   You may brush your teeth the morning of surgery.  Take these meds the morning of surgery with a sip of water: BP med Cogard and Protonix  DO NOT wear jewelry, eye make-up, lipstick,body lotion, or dark fingernail polish.  (Polished toes are ok) You may wear deodorant.  If you are to be admitted after surgery, leave suitcase in car until your room has been assigned. Patients discharged on the day of surgery will not be allowed to drive home. Wear loose fitting, comfortable clothes for your ride home.

## 2014-06-14 ENCOUNTER — Encounter (HOSPITAL_COMMUNITY)
Admission: RE | Disposition: A | Discharge status: Home / Self Care | Payer: Self-pay | Source: Ambulatory Visit | Attending: Gynecology

## 2014-06-14 ENCOUNTER — Encounter (HOSPITAL_COMMUNITY): Payer: Self-pay

## 2014-06-14 ENCOUNTER — Ambulatory Visit (HOSPITAL_COMMUNITY)
Admission: RE | Admit: 2014-06-14 | Discharge: 2014-06-15 | Disposition: A | Discharge status: Home / Self Care | Payer: Medicare Other | Source: Ambulatory Visit | Attending: Gynecology | Admitting: Gynecology

## 2014-06-14 ENCOUNTER — Ambulatory Visit (HOSPITAL_COMMUNITY): Payer: Medicare Other | Admitting: Anesthesiology

## 2014-06-14 ENCOUNTER — Telehealth: Payer: Self-pay

## 2014-06-14 DIAGNOSIS — D259 Leiomyoma of uterus, unspecified: Secondary | ICD-10-CM | POA: Diagnosis not present

## 2014-06-14 DIAGNOSIS — I10 Essential (primary) hypertension: Secondary | ICD-10-CM | POA: Insufficient documentation

## 2014-06-14 DIAGNOSIS — N814 Uterovaginal prolapse, unspecified: Secondary | ICD-10-CM | POA: Diagnosis not present

## 2014-06-14 DIAGNOSIS — N816 Rectocele: Secondary | ICD-10-CM | POA: Diagnosis not present

## 2014-06-14 DIAGNOSIS — N8111 Cystocele, midline: Secondary | ICD-10-CM | POA: Diagnosis not present

## 2014-06-14 DIAGNOSIS — J45909 Unspecified asthma, uncomplicated: Secondary | ICD-10-CM | POA: Diagnosis not present

## 2014-06-14 DIAGNOSIS — K219 Gastro-esophageal reflux disease without esophagitis: Secondary | ICD-10-CM | POA: Insufficient documentation

## 2014-06-14 DIAGNOSIS — N813 Complete uterovaginal prolapse: Secondary | ICD-10-CM | POA: Diagnosis not present

## 2014-06-14 HISTORY — PX: VAGINAL HYSTERECTOMY: SHX2639

## 2014-06-14 HISTORY — PX: ANTERIOR AND POSTERIOR REPAIR: SHX5121

## 2014-06-14 SURGERY — HYSTERECTOMY, VAGINAL
Anesthesia: General | Site: Vagina

## 2014-06-14 MED ORDER — ONDANSETRON HCL 4 MG/2ML IJ SOLN
INTRAMUSCULAR | Status: AC
  Filled 2014-06-14: qty 2

## 2014-06-14 MED ORDER — NEOSTIGMINE METHYLSULFATE 10 MG/10ML IV SOLN
INTRAVENOUS | Status: DC | PRN
Start: 2014-06-14 — End: 2014-06-14
  Administered 2014-06-14: 3 mg via INTRAVENOUS

## 2014-06-14 MED ORDER — KETOROLAC TROMETHAMINE 30 MG/ML IJ SOLN: 30.0000 mg | Freq: Four times a day (QID) | INTRAMUSCULAR | Status: DC

## 2014-06-14 MED ORDER — ROCURONIUM BROMIDE 100 MG/10ML IV SOLN
INTRAVENOUS | Status: DC | PRN
  Administered 2014-06-14 (×2): 10 mg via INTRAVENOUS
  Administered 2014-06-14: 30 mg via INTRAVENOUS

## 2014-06-14 MED ORDER — LIDOCAINE HCL (CARDIAC) 20 MG/ML IV SOLN
INTRAVENOUS | Status: AC
  Filled 2014-06-14: qty 5

## 2014-06-14 MED ORDER — FENTANYL CITRATE (PF) 100 MCG/2ML IJ SOLN
INTRAMUSCULAR | Status: AC
  Filled 2014-06-14: qty 2

## 2014-06-14 MED ORDER — LIDOCAINE-EPINEPHRINE (PF) 1 %-1:200000 IJ SOLN
INTRAMUSCULAR | Status: AC
  Filled 2014-06-14: qty 30

## 2014-06-14 MED ORDER — MORPHINE SULFATE 4 MG/ML IJ SOLN
1.0000 mg | INTRAMUSCULAR | Status: DC | PRN
  Administered 2014-06-14: 2 mg via INTRAVENOUS
  Filled 2014-06-14: qty 1

## 2014-06-14 MED ORDER — LACTATED RINGERS IV SOLN
INTRAVENOUS | Status: DC
  Administered 2014-06-14 (×3): via INTRAVENOUS

## 2014-06-14 MED ORDER — HYDROMORPHONE HCL 1 MG/ML IJ SOLN
0.2500 mg | INTRAMUSCULAR | Status: DC | PRN
  Administered 2014-06-14 (×2): 0.5 mg via INTRAVENOUS

## 2014-06-14 MED ORDER — LIDOCAINE HCL (CARDIAC) 20 MG/ML IV SOLN
INTRAVENOUS | Status: DC | PRN
  Administered 2014-06-14: 40 mg via INTRAVENOUS
  Administered 2014-06-14: 60 mg via INTRAVENOUS

## 2014-06-14 MED ORDER — ESTRADIOL 0.1 MG/GM VA CREA
TOPICAL_CREAM | VAGINAL | Status: AC
  Filled 2014-06-14: qty 42.5

## 2014-06-14 MED ORDER — KETOROLAC TROMETHAMINE 15 MG/ML IJ SOLN
15.0000 mg | Freq: Four times a day (QID) | INTRAMUSCULAR | Status: DC
  Administered 2014-06-14 – 2014-06-15 (×3): 15 mg via INTRAVENOUS
  Filled 2014-06-14 (×4): qty 1

## 2014-06-14 MED ORDER — KETOROLAC TROMETHAMINE 30 MG/ML IJ SOLN
30.0000 mg | Freq: Four times a day (QID) | INTRAMUSCULAR | Status: DC
  Administered 2014-06-14: 30 mg via INTRAVENOUS

## 2014-06-14 MED ORDER — FENTANYL CITRATE (PF) 100 MCG/2ML IJ SOLN
INTRAMUSCULAR | Status: DC | PRN
  Administered 2014-06-14 (×2): 50 ug via INTRAVENOUS
  Administered 2014-06-14: 100 ug via INTRAVENOUS

## 2014-06-14 MED ORDER — PROMETHAZINE HCL 25 MG/ML IJ SOLN
6.2500 mg | INTRAMUSCULAR | Status: DC | PRN
Start: 2014-06-14 — End: 2014-06-14

## 2014-06-14 MED ORDER — PROPOFOL 10 MG/ML IV BOLUS
INTRAVENOUS | Status: AC
  Filled 2014-06-14: qty 20

## 2014-06-14 MED ORDER — ONDANSETRON HCL 4 MG/2ML IJ SOLN: 4.0000 mg | Freq: Four times a day (QID) | INTRAMUSCULAR | Status: DC | PRN

## 2014-06-14 MED ORDER — OXYCODONE-ACETAMINOPHEN 5-325 MG PO TABS
1.0000 | ORAL_TABLET | ORAL | Status: DC | PRN
  Administered 2014-06-14 – 2014-06-15 (×3): 1 via ORAL
  Filled 2014-06-14 (×3): qty 1

## 2014-06-14 MED ORDER — FLUTICASONE PROPIONATE 50 MCG/ACT NA SUSP: 1.0000 | Freq: Every day | NASAL | Status: DC | PRN

## 2014-06-14 MED ORDER — PROPOFOL 10 MG/ML IV BOLUS
INTRAVENOUS | Status: DC | PRN
  Administered 2014-06-14: 20 mg via INTRAVENOUS
  Administered 2014-06-14: 130 mg via INTRAVENOUS

## 2014-06-14 MED ORDER — HYDROMORPHONE HCL 1 MG/ML IJ SOLN
INTRAMUSCULAR | Status: AC
  Filled 2014-06-14: qty 1

## 2014-06-14 MED ORDER — METHYLENE BLUE 1 % INJ SOLN
INTRAMUSCULAR | Status: AC
  Filled 2014-06-14: qty 1

## 2014-06-14 MED ORDER — KETOROLAC TROMETHAMINE 30 MG/ML IJ SOLN
INTRAMUSCULAR | Status: AC
  Filled 2014-06-14: qty 1

## 2014-06-14 MED ORDER — ONDANSETRON HCL 4 MG/2ML IJ SOLN
INTRAMUSCULAR | Status: DC | PRN
  Administered 2014-06-14: 4 mg via INTRAVENOUS

## 2014-06-14 MED ORDER — LIDOCAINE-EPINEPHRINE 1 %-1:100000 IJ SOLN
INTRAMUSCULAR | Status: DC | PRN
  Administered 2014-06-14: 10 mL

## 2014-06-14 MED ORDER — NADOLOL 40 MG PO TABS
40.0000 mg | ORAL_TABLET | Freq: Two times a day (BID) | ORAL | Status: DC
  Administered 2014-06-14 – 2014-06-15 (×2): 40 mg via ORAL
  Filled 2014-06-14 (×2): qty 1

## 2014-06-14 MED ORDER — PHENYLEPHRINE HCL 10 MG/ML IJ SOLN
INTRAMUSCULAR | Status: DC | PRN
  Administered 2014-06-14 (×5): 40 ug via INTRAVENOUS

## 2014-06-14 MED ORDER — FENTANYL CITRATE (PF) 250 MCG/5ML IJ SOLN
INTRAMUSCULAR | Status: AC
  Filled 2014-06-14: qty 5

## 2014-06-14 MED ORDER — MIDAZOLAM HCL 2 MG/2ML IJ SOLN
INTRAMUSCULAR | Status: AC
  Filled 2014-06-14: qty 2

## 2014-06-14 MED ORDER — ONDANSETRON HCL 4 MG PO TABS: 4.0000 mg | ORAL_TABLET | Freq: Four times a day (QID) | ORAL | Status: DC | PRN

## 2014-06-14 MED ORDER — SCOPOLAMINE 1 MG/3DAYS TD PT72
MEDICATED_PATCH | TRANSDERMAL | Status: AC
  Administered 2014-06-14: 1.5 mg via TRANSDERMAL
  Filled 2014-06-14: qty 1

## 2014-06-14 MED ORDER — LIDOCAINE-EPINEPHRINE 1 %-1:100000 IJ SOLN
INTRAMUSCULAR | Status: AC
  Filled 2014-06-14: qty 1

## 2014-06-14 MED ORDER — LISINOPRIL 5 MG PO TABS
5.0000 mg | ORAL_TABLET | Freq: Every day | ORAL | Status: DC
  Administered 2014-06-15: 5 mg via ORAL
  Filled 2014-06-14: qty 1

## 2014-06-14 MED ORDER — MIDAZOLAM HCL 5 MG/5ML IJ SOLN
INTRAMUSCULAR | Status: DC | PRN
  Administered 2014-06-14: 2 mg via INTRAVENOUS

## 2014-06-14 MED ORDER — PANTOPRAZOLE SODIUM 40 MG PO TBEC
40.0000 mg | DELAYED_RELEASE_TABLET | Freq: Every day | ORAL | Status: DC
  Administered 2014-06-15: 40 mg via ORAL
  Filled 2014-06-14: qty 1

## 2014-06-14 MED ORDER — DEXAMETHASONE SODIUM PHOSPHATE 10 MG/ML IJ SOLN
INTRAMUSCULAR | Status: AC
  Filled 2014-06-14: qty 1

## 2014-06-14 MED ORDER — ESTRADIOL 0.1 MG/GM VA CREA
TOPICAL_CREAM | VAGINAL | Status: DC | PRN
  Administered 2014-06-14: 1 via VAGINAL

## 2014-06-14 MED ORDER — SCOPOLAMINE 1 MG/3DAYS TD PT72
1.0000 | MEDICATED_PATCH | Freq: Once | TRANSDERMAL | Status: DC
  Administered 2014-06-14: 1.5 mg via TRANSDERMAL

## 2014-06-14 MED ORDER — GLYCOPYRROLATE 0.2 MG/ML IJ SOLN
INTRAMUSCULAR | Status: DC | PRN
  Administered 2014-06-14: 0.6 mg via INTRAVENOUS

## 2014-06-14 MED ORDER — ALBUTEROL SULFATE (2.5 MG/3ML) 0.083% IN NEBU: 3.0000 mL | INHALATION_SOLUTION | Freq: Four times a day (QID) | RESPIRATORY_TRACT | Status: DC | PRN

## 2014-06-14 MED ORDER — ROCURONIUM BROMIDE 100 MG/10ML IV SOLN
INTRAVENOUS | Status: AC
  Filled 2014-06-14: qty 1

## 2014-06-14 MED ORDER — FENTANYL CITRATE (PF) 100 MCG/2ML IJ SOLN
25.0000 ug | INTRAMUSCULAR | Status: DC | PRN
  Administered 2014-06-14 (×3): 50 ug via INTRAVENOUS

## 2014-06-14 MED ORDER — DEXAMETHASONE SODIUM PHOSPHATE 4 MG/ML IJ SOLN
INTRAMUSCULAR | Status: DC | PRN
  Administered 2014-06-14: 10 mg via INTRAVENOUS

## 2014-06-14 MED ORDER — DIPHENHYDRAMINE HCL 25 MG PO CAPS: 50.0000 mg | ORAL_CAPSULE | Freq: Four times a day (QID) | ORAL | Status: DC | PRN

## 2014-06-14 MED ORDER — PHENYLEPHRINE 40 MCG/ML (10ML) SYRINGE FOR IV PUSH (FOR BLOOD PRESSURE SUPPORT)
PREFILLED_SYRINGE | INTRAVENOUS | Status: AC
  Filled 2014-06-14: qty 10

## 2014-06-14 MED ORDER — BUPIVACAINE HCL (PF) 0.25 % IJ SOLN
INTRAMUSCULAR | Status: AC
  Filled 2014-06-14: qty 30

## 2014-06-14 MED ORDER — HEPARIN SODIUM (PORCINE) 5000 UNIT/ML IJ SOLN
INTRAMUSCULAR | Status: AC
  Filled 2014-06-14: qty 1

## 2014-06-14 MED ORDER — DEXTROSE-NACL 5-0.9 % IV SOLN
INTRAVENOUS | Status: DC
  Administered 2014-06-14: 18:00:00 via INTRAVENOUS

## 2014-06-14 MED ORDER — GLYCOPYRROLATE 0.2 MG/ML IJ SOLN
INTRAMUSCULAR | Status: AC
  Filled 2014-06-14: qty 1

## 2014-06-14 MED ORDER — KETOROLAC TROMETHAMINE 15 MG/ML IJ SOLN
15.0000 mg | Freq: Four times a day (QID) | INTRAMUSCULAR | Status: DC
  Filled 2014-06-14 (×4): qty 1

## 2014-06-14 MED ORDER — MEPERIDINE HCL 25 MG/ML IJ SOLN: 6.2500 mg | INTRAMUSCULAR | Status: DC | PRN

## 2014-06-14 SURGICAL SUPPLY — 34 items
CANISTER SUCT 3000ML (MISCELLANEOUS) ×4 IMPLANT
CLOTH BEACON ORANGE TIMEOUT ST (SAFETY) ×4 IMPLANT
CONT PATH 16OZ SNAP LID 3702 (MISCELLANEOUS) IMPLANT
CONTAINER PREFILL 10% NBF 60ML (FORM) IMPLANT
DECANTER SPIKE VIAL GLASS SM (MISCELLANEOUS) IMPLANT
DRSG TELFA 3X8 NADH (GAUZE/BANDAGES/DRESSINGS) ×4 IMPLANT
GAUZE PACKING 2X5 YD STRL (GAUZE/BANDAGES/DRESSINGS) IMPLANT
GAUZE SPONGE 4X4 16PLY XRAY LF (GAUZE/BANDAGES/DRESSINGS) ×3 IMPLANT
GLOVE BIO SURGEON STRL SZ7.5 (GLOVE) ×4 IMPLANT
GLOVE BIOGEL PI IND STRL 6.5 (GLOVE) ×2 IMPLANT
GLOVE BIOGEL PI INDICATOR 6.5 (GLOVE) ×2
GOWN STRL REUS W/TWL LRG LVL3 (GOWN DISPOSABLE) ×16 IMPLANT
NDL SPNL 18GX3.5 QUINCKE PK (NEEDLE) ×1 IMPLANT
NDL SPNL 22GX3.5 QUINCKE BK (NEEDLE) IMPLANT
NEEDLE HYPO 22GX1.5 SAFETY (NEEDLE) IMPLANT
NEEDLE MAYO .5 CIRCLE (NEEDLE) IMPLANT
NEEDLE SPNL 18GX3.5 QUINCKE PK (NEEDLE) ×4 IMPLANT
NEEDLE SPNL 22GX3.5 QUINCKE BK (NEEDLE) IMPLANT
NS IRRIG 1000ML POUR BTL (IV SOLUTION) ×4 IMPLANT
PACK VAGINAL WOMENS (CUSTOM PROCEDURE TRAY) ×4 IMPLANT
PAD DRESSING TELFA 3X8 NADH (GAUZE/BANDAGES/DRESSINGS) ×1 IMPLANT
PAD OB MATERNITY 4.3X12.25 (PERSONAL CARE ITEMS) ×4 IMPLANT
SUT VIC AB 0 CT1 18XCR BRD8 (SUTURE) ×6 IMPLANT
SUT VIC AB 0 CT1 36 (SUTURE) ×4 IMPLANT
SUT VIC AB 0 CT1 8-18 (SUTURE) ×12
SUT VIC AB 0 CT2 27 (SUTURE) IMPLANT
SUT VIC AB 2-0 SH 27 (SUTURE) ×44
SUT VIC AB 2-0 SH 27XBRD (SUTURE) ×11 IMPLANT
SUT VICRYL 0 TIES 12 18 (SUTURE) ×4 IMPLANT
SUT VICRYL 3 0 BR 18  UND (SUTURE)
SUT VICRYL 3 0 BR 18 UND (SUTURE) IMPLANT
TOWEL OR 17X24 6PK STRL BLUE (TOWEL DISPOSABLE) ×8 IMPLANT
TRAY FOLEY CATH SILVER 14FR (SET/KITS/TRAYS/PACK) ×4 IMPLANT
WATER STERILE IRR 1000ML POUR (IV SOLUTION) ×4 IMPLANT

## 2014-06-14 NOTE — Anesthesia Procedure Notes (Signed)
Procedure Name: Intubation Date/Time: 06/14/2014 10:01 AM Performed by: Ignacia Bayley Pre-anesthesia Checklist: Patient identified, Emergency Drugs available, Suction available and Patient being monitored Patient Re-evaluated:Patient Re-evaluated prior to inductionOxygen Delivery Method: Circle system utilized Preoxygenation: Pre-oxygenation with 100% oxygen Intubation Type: IV induction Ventilation: Mask ventilation without difficulty Laryngoscope Size: Miller and 2 Grade View: Grade I Tube type: Oral Tube size: 7.0 mm Number of attempts: 1 Airway Equipment and Method: Stylet Placement Confirmation: ETT inserted through vocal cords under direct vision,  positive ETCO2 and breath sounds checked- equal and bilateral Secured at: 20 cm Tube secured with: Tape Dental Injury: Teeth and Oropharynx as per pre-operative assessment

## 2014-06-14 NOTE — H&P (Signed)
  The patient was examined.  I reviewed the proposed surgery and consent form with the patient.  The dictated history and physical is current and accurate and all questions were answered. The patient is ready to proceed with surgery and has a realistic understanding and expectation for the outcome.   Anastasio Auerbach MD, 9:41 AM 06/14/2014

## 2014-06-14 NOTE — Op Note (Signed)
Anna Marquez [birth year] [id number]   Post Operative Note   Date of surgery:  06/14/2014  Pre Op Dx:  Uterine prolapse, cystocele, rectocele  Post Op Dx:  Uterine prolapse, cystocele, rectocele, leiomyoma  Procedure:  Total vaginal hysterectomy, anterior colporrhaphy, posterior colporrhaphy, uterosacral ligament plication, McCall's culdoplasty  Surgeon:  Anastasio Auerbach  Assistant:  Uvaldo Rising  Anesthesia:  General  EBL:  200 cc anesthesia reported  Complications:  None  Specimen:  Uterus to pathology  Findings: EUA:  External, BUS, vagina with atrophic changes and cystocele, rectocele and uterine prolapse. Cervix atrophic in appearance. Uterus grossly normal size midline mobile. Adnexa without gross masses   Operative:  Uterus grossly normal in size with multiple small myomas subserosal to pedunculated.  Right and left ovaries not clearly visualized. Right and left fallopian tube segments not clearly visualized. Cul-de-sac grossly normal to limited inspection.  Procedure:  The patient was taken to the operating room, placed in the low dorsal lithotomy position, underwent general anesthesia and received a vaginal/perineal preparation with Betadine solution per nursing personnel as well as a DuraPrep abdominal preparation in the event that laparoscopic surgery was necessary. A Foley catheter was also placed in sterile technique by nursing personnel.  The timeout was performed by the surgical team. An EUA was performed. The patient was draped in the usual fashion and the cervix was visualized with a weighted speculum, grasped with a single-tooth tenaculum and the cervical mucosa was circumferentially injected using 1% lidocaine with 1:100,000 dilution epinephrine. The cervical mucosa was then circumferentially incised and the paracervical planes were sharply developed with care taken to start very low on the cervix given her history of prolapse and the potential bladder distortion..  The anterior vesicouterine plane was sharply and bluntly developed. The posterior cul-de-sac was then entered directly difficulty and a long weighted speculum was placed, the right and left uterosacral ligaments identified, clamped cut and ligated using 0 Vicryl suture and tagged for future reference. The vesicouterine plane was continually developed through sharp and blunt dissection and the uterus was then progressively freed from its attachments through clamping, cutting and ligating the cardinal ligaments and parametrial tissues using 0 Vicryl suture in interrupted stitch. The anterior cul-de-sac was ultimately entered without difficulty and the uterine vessels bilaterally were identified clamped cut and ligated using 0 Vicryl suture.  The uterus was then delivered through the vagina and the uterine-ovarian pedicles bilaterally were clamped and cut and the specimen was removed and sent to pathology. The uterine-ovarian pedicles were then doubly ligated using 0 Vicryl suture. The bowel was then packed from the cul-de-sac using a tagged tail sponge and inspection of the adnexa was normal but due to the ovaries being high in the pelvis I was unable to clearly visualize the ovaries/fallopian tubes but no pathology was seen. As per our prior discussion preoperatively the patient desires to leave the ovaries if unable to be retrieved vaginally. The long weighted speculum was then replaced with a shorter weighted speculum and the posterior vaginal cuff was run from uterosacral ligament to uterosacral ligament using 0 Vicryl suture securing the upper vaginal cuff to the uterosacral ligaments bilaterally for vaginal cuff support. Attention was then turned to the anterior colporrhaphy and the vaginal cuff mucosa was grasped with Allis clamps, the midline identified and the vaginal mucosa was progressively sharply incised from the cuff to 2 finger breaths below the urethral opening. Sequential Allis clamps were placed  along the cut vaginal mucosa and through sharp and blunt  dissection the vesicovaginal plane was developed to free the bladder from the underlying vaginal mucosa. The pubovaginal fascia was clearly noted bilaterally. The cystocele was then reduced through sequential interrupted 2-0 Vicryl sutures reapproximating the pubovaginal fascia, noting good support of the cystocele. The excess vaginal mucosa was then excised and the vaginal mucosa was reapproximated using 2-0 Vicryl suture in a running interlocking stitch incorporating the underlying tissue to close the dead space. A McCall's culdoplasty stitch was then placed to obliterate the cul-de-sac and the vaginal cuff was then reapproximated anterior to posterior using 0 Vicryl suture in interrupted figure-of-eight stitch after plication of the uterosacral ligaments. The McCall's culdoplasty stitch was then tied. Attention was then turned to the rectocele repair and Allis clamps were applied bilaterally to the opening to the vagina and the intervening posterior fourchette tissue was incised transversely between the clamps. The posterior vaginal mucosa was then sharply incised caudal to cephalad progressively applying Allis clamps to within 1 fingerbreadth below the closed vaginal cuff. Allis clamps were applied along the vaginal mucosa and using sharp and blunt dissection the pararectal tissues were freed from the overlying vaginal mucosa reducing the rectocele. Using 2-0 Vicryl sutures in interrupted stitch the perirectal tissues were reapproximated sequentially to reduce the rectocele. The excess vaginal mucosa was then excised and the vagina was then closed using 2-0 Vicryl suture in a running interlocking stitch. Support was given to the perineum with a interrupted 0 Vicryl suture subcutaneously and the posterior fourchette/external perineal skin was reapproximated using 2-0 Vicryl and interrupted cutaneous stitch with a short running skin stitch.. The vagina was  Irrigated showing adequate hemostasis and vaginal packing with Estrace vaginal cream was inserted. There was clear yellow urine throughout the case. Patient was awakened without difficulty and taken to recovery in good condition having tolerated procedure well having received intraoperative Toradol.     Anastasio Auerbach MD, 12:58 PM 06/14/2014

## 2014-06-14 NOTE — Progress Notes (Signed)
Patient ID: SOJOURNER Marquez, female   DOB: 01-Apr-1950, 64 y.o.   MRN: [id number] Anna Marquez [birth year] [id number]   Day of Surgery s/p Procedure(s): HYSTERECTOMY VAGINAL ANTERIOR (CYSTOCELE) AND POSTERIOR REPAIR (RECTOCELE)  Subjective: Patient reports no complaints, pain severity reported mild, Yes.   taking PO, foley catheter in place, No. ambulating, No. passing flatus  Objective: Vital signs in last 24 hours: Temp:  [98.2 F (36.8 C)-98.6 F (37 C)] 98.6 F (37 C) (06/07 1536) Pulse Rate:  [73-93] 93 (06/07 1536) Resp:  [12-20] 18 (06/07 1536) BP: (108-139)/(58-91) 116/73 mmHg (06/07 1536) SpO2:  [97 %-100 %] 97 % (06/07 1536) Weight:  [163 lb (73.936 kg)] 163 lb (73.936 kg) (06/07 1400) Last BM Date: 06/14/14  EXAM General: awake, no distress Resp: clear to auscultation bilaterally Cardio: regular rate and rhythm GI: soft, minimal tenderness, bowel sounds present Lower Extremities: Without swelling or tenderness Vaginal Bleeding: none  Lab Results:   Recent Labs  06/13/14 1220  WBC 5.3  HGB 12.1  HCT 34.7*  PLT 181    Assessment: s/p Procedure(s): HYSTERECTOMY VAGINAL ANTERIOR (CYSTOCELE) AND POSTERIOR REPAIR (RECTOCELE): stable and progressing  Plan: Continue routine post operative care.  Advance PO care as tolerated.  Reviewed results of the surgery with the patient and her family.  Reviewed PO instructions, precautions and follow up.  Plan discharge tomorrow after vag packing/foley out.    Anastasio Auerbach MD, 5:41 PM 06/14/2014

## 2014-06-14 NOTE — Transfer of Care (Signed)
Immediate Anesthesia Transfer of Care Note  Patient: Anna Marquez  Procedure(s) Performed: Procedure(s): HYSTERECTOMY VAGINAL (N/A) SALPINGO OOPHORECTOMY (Bilateral) ANTERIOR (CYSTOCELE) AND POSTERIOR REPAIR (RECTOCELE) (N/A)  Patient Location: PACU  Anesthesia Type:General  Level of Consciousness: sedated  Airway & Oxygen Therapy: Patient Spontanous Breathing and Patient connected to nasal cannula oxygen  Post-op Assessment: Report given to RN and Post -op Vital signs reviewed and stable  Post vital signs: stable  Last Vitals:  Filed Vitals:   06/14/14 0751  BP: 139/91  Pulse: 80  Temp: 36.8 C  Resp: 20    Complications: No apparent anesthesia complications

## 2014-06-14 NOTE — Anesthesia Postprocedure Evaluation (Signed)
  Anesthesia Post-op Note  Patient: Anna Marquez  Procedure(s) Performed: Procedure(s): HYSTERECTOMY VAGINAL (N/A) SALPINGO OOPHORECTOMY (Bilateral) ANTERIOR (CYSTOCELE) AND POSTERIOR REPAIR (RECTOCELE) (N/A)  Patient Location: PACU  Anesthesia Type:General  Level of Consciousness: awake and alert   Airway and Oxygen Therapy: Patient Spontanous Breathing  Post-op Pain: mild  Post-op Assessment: Post-op Vital signs reviewed  Post-op Vital Signs: Reviewed and stable  Last Vitals:  Filed Vitals:   06/14/14 1230  BP: 113/68  Pulse: 84  Temp: 37 C  Resp: 19    Complications: No apparent anesthesia complications

## 2014-06-14 NOTE — Telephone Encounter (Signed)
Pharmacist called from [facility] regarding post op pain medication Dr. Loetta Rough prescribed. Patient is 64 yo and Dr. Loetta Rough wrote for Toradol 30mg  but 15mg  is recommended dose for her age.  Dr. Loetta Rough said Toradol 15 mg is fine. Pharmacist notified.

## 2014-06-15 ENCOUNTER — Encounter (HOSPITAL_COMMUNITY): Payer: Self-pay | Admitting: Gynecology

## 2014-06-15 DIAGNOSIS — N813 Complete uterovaginal prolapse: Secondary | ICD-10-CM | POA: Diagnosis not present

## 2014-06-15 MED ORDER — OXYCODONE-ACETAMINOPHEN 5-325 MG PO TABS: 1.0000 | ORAL_TABLET | ORAL | Status: DC | PRN

## 2014-06-15 NOTE — Progress Notes (Signed)
Pt ambulated out teaching complete  

## 2014-06-15 NOTE — Progress Notes (Signed)
Anna Marquez [birth year] [id number]   1 Day Post-Op  HYSTERECTOMY VAGINAL ANTERIOR (CYSTOCELE) AND POSTERIOR REPAIR (RECTOCELE)  Subjective: Patient reports feels well, no complaints, pain severity reported mild, Yes.   taking PO, foley catheter in place, Yes.   ambulating, Yes.   passing flatus  Objective: Vital signs in last 24 hours: Temp:  [98.1 F (36.7 C)-99.7 F (37.6 C)] 98.2 F (36.8 C) (06/08 0623) Pulse Rate:  [71-101] 71 (06/08 0623) Resp:  [12-20] 18 (06/08 0623) BP: (96-139)/(49-91) 111/65 mmHg (06/08 0623) SpO2:  [94 %-100 %] 96 % (06/08 0623) Weight:  [163 lb (73.936 kg)] 163 lb (73.936 kg) (06/07 1400) Last BM Date: 06/14/14    EXAM General: awake, alert and no distress Resp: clear to auscultation bilaterally Cardio: regular rate and rhythm GI: soft, non tender, bowel sounds active Lower Extremities: Without swelling or tenderness Vaginal Bleeding: Vag packing removed with scant bleeding   Lab Results:   Recent Labs  06/13/14 1220  WBC 5.3  HGB 12.1  HCT 34.7*  PLT 181    Assessment: s/p Procedure(s): HYSTERECTOMY VAGINAL ANTERIOR (CYSTOCELE) AND POSTERIOR REPAIR (RECTOCELE): progressing well, ready for discharge.    Plan: Discharge home today after voiding.  I again reviewed precautions, instructions and follow up.  Prescriptions provided per AVS.  Patient to call the office to arrange a post-operative appointmant in 2 weeks.    Anastasio Auerbach MD, 7:17 AM 06/15/2014

## 2014-06-15 NOTE — Discharge Instructions (Signed)
°  Postoperative Instructions Hysterectomy ° °Dr. Raiden Yearwood and the nursing staff have discussed postoperative instructions with you.  If you have any questions please ask them before you leave the hospital, or call Dr Megumi Treaster’s office at [phone].   ° °We would like to emphasize the following instructions: ° ° °  Call the office to make your follow-up appointment as recommended by Dr Ison Wichmann (usually 2 weeks). ° °  You were given a prescription, or one was ordered for you at the pharmacy you designated.  Get that prescription filled and take the medication according to instructions. ° °  You may eat a regular diet, but slowly until you start having bowel movements. ° °  Drink plenty of water daily. ° °  Nothing in the vagina (intercourse, douching, objects of any kind) until released by Dr Mikale Silversmith. ° °  No driving for two weeks.  Wait to be cleared by Dr Kristel Durkee at your first post op check.  Car rides (short) are ok after several days at home, as long as you are not having significant pain, but no traveling out of town. ° °  You may shower, but no baths.  Walking up and down stairs is ok.  No heavy lifting, prolonged standing, repeated bending or any “working out” until your first post op check. ° °  Rest frequently, listen to your body and do not push yourself and overdo it. ° °  Call if: ° °o Your pain medication does not seem strong enough. °o Worsening pain or abdominal bloating °o Persistent nausea or vomiting °o Difficulty with urination or bowel movements. °o Temperature of 101 degrees or higher. °o Bleeding heavier then staining (clots or period type flow). °o Incisions become red, tender or begin to drain. °o You have any questions or concerns. °

## 2014-06-16 ENCOUNTER — Encounter (HOSPITAL_COMMUNITY): Payer: Self-pay | Admitting: Gynecology

## 2014-06-16 NOTE — Discharge Summary (Signed)
Anna Marquez 12/12/1950 [id number]   Discharge Summary  Date of Admission:  06/14/2014  Date of Discharge:  06/15/2014  Discharge Diagnosis:  Uterine prolapse, cystocele, rectocele  Procedure:  Procedure(s): HYSTERECTOMY VAGINAL ANTERIOR (CYSTOCELE) AND POSTERIOR REPAIR (RECTOCELE)  Pathology:  Uterus and cervix - BENIGN PROLIFERATIVE PATTERN ENDOMETRIUM WITH UNDERLYING MYOMETRIUM DEMONSTRATING LEIOMYOMA FORMATION. - BENIGN CERVIX. - NO DYSPLASIA, ATYPIA, HYPERPLASIA OR MALIGNANCY IDENTIFIED.  Hospital Course:  Patient underwent an uncomplicated TVH, anterior/posterior colporrhaphy 06/14/2014. Her postoperative course was uncomplicated and she was discharged on postoperative day #1 after removal of her vaginal packing and Foley catheter and she was voiding without difficulty, eating a regular diet, good pain relief on oral medications.   The patient received instructions for postoperative care and call precautions.  She received prescriptions per AVS and will be seen in the office 2 weeks following discharge.       Anastasio Auerbach MD, 8:08 AM 06/16/2014

## 2014-06-27 ENCOUNTER — Encounter: Payer: Self-pay | Admitting: *Deleted

## 2014-06-28 ENCOUNTER — Encounter: Payer: Self-pay | Admitting: Gynecology

## 2014-06-28 ENCOUNTER — Ambulatory Visit (INDEPENDENT_AMBULATORY_CARE_PROVIDER_SITE_OTHER): Payer: Medicare Other | Admitting: Gynecology

## 2014-06-28 VITALS — BP 124/78

## 2014-06-28 DIAGNOSIS — R350 Frequency of micturition: Secondary | ICD-10-CM

## 2014-06-28 DIAGNOSIS — Z9889 Other specified postprocedural states: Secondary | ICD-10-CM

## 2014-06-28 LAB — URINALYSIS W MICROSCOPIC + REFLEX CULTURE
Bilirubin Urine: NEGATIVE
CASTS: NONE SEEN
Crystals: NONE SEEN
GLUCOSE, UA: NEGATIVE mg/dL
Ketones, ur: NEGATIVE mg/dL
Nitrite: NEGATIVE
PH: 5 (ref 5.0–8.0)
Protein, ur: 30 mg/dL — AB
Specific Gravity, Urine: 1.03 (ref 1.005–1.030)
Urobilinogen, UA: 0.2 mg/dL (ref 0.0–1.0)
WBC, UA: >50 WBC/hpf — AB (ref <3)

## 2014-06-28 MED ORDER — CIPROFLOXACIN HCL 250 MG PO TABS: 250.0000 mg | ORAL_TABLET | Freq: Two times a day (BID) | ORAL | Status: DC

## 2014-06-28 MED ORDER — FLUCONAZOLE 150 MG PO TABS: 150.0000 mg | ORAL_TABLET | Freq: Once | ORAL | Status: DC

## 2014-06-28 NOTE — Progress Notes (Signed)
Anna Marquez 1950/05/29 [id number]        64 y.o.  G2P2 Presents for her first postoperative visit 2 weeks status post TVH anterior and posterior colporrhaphy. Doing well. Does note slight urinary incontinence where she'll leak a little urine but not continuously. Seems to be more of an issue when she is up and moving versus sleeping. No frequency dysuria or urgency.  Having bowel movements without difficulty. Overall feeling well without significant discomfort.  Past medical history,surgical history, problem list, medications, allergies, family history and social history were all reviewed and documented in the EPIC chart.  Directed ROS with pertinent positives and negatives documented in the history of present illness/assessment and plan.  Exam: [name] assistant Filed Vitals:   06/28/14 1609  BP: 124/78   General appearance:  Normal Abdomen soft nontender without masses guarding rebound Pelvic external BUS vagina with suture lines all intact along anterior and posterior vaginal surfaces as well as vaginal cuff. Bimanual without masses or tenderness.  Assessment/Plan:  64 y.o. G2P2 with normal postoperative visit status post TVH anterior and posterior colporrhaphy. Notes a little bit of urinary incontinence but not consistent. Urinalysis is consistent with a probable UTI but also shows rare yeast. She has many WBC and many bacteria with few squamous cells. Will cover with ciprofloxacin 250 mg twice a day 7 days. Diflucan 150 mg 1 day. We'll slowly increase activities maintaining pelvic rest and no real strenuous bearing down type activities. Follow up in 2 weeks for next postoperative visit.    Anastasio Auerbach MD, 4:52 PM 06/28/2014

## 2014-06-28 NOTE — Patient Instructions (Signed)
Take the antibiotic twice daily for 7 days. Take the Diflucan pill once. Follow up in 2 weeks for your next postoperative visit.

## 2014-06-30 LAB — URINE CULTURE

## 2014-07-14 ENCOUNTER — Ambulatory Visit (INDEPENDENT_AMBULATORY_CARE_PROVIDER_SITE_OTHER): Payer: Medicare Other | Admitting: Gynecology

## 2014-07-14 ENCOUNTER — Encounter: Payer: Self-pay | Admitting: Gynecology

## 2014-07-14 VITALS — BP 130/76

## 2014-07-14 DIAGNOSIS — Z9889 Other specified postprocedural states: Secondary | ICD-10-CM

## 2014-07-14 NOTE — Progress Notes (Signed)
Anna Marquez 12-20-1950 [id number]        64 y.o.  G2P2 presents for her four-week postoperative visit status post TVH anterior and posterior colporrhaphy. Doing well without complaints. Voiding without difficulty. Had transient loss of urine and was found to have a UTI. Was treated and those symptoms have resolved. Not having any issues with bowel movements.  Past medical history,surgical history, problem list, medications, allergies, family history and social history were all reviewed and documented in the EPIC chart.  Directed ROS with pertinent positives and negatives documented in the history of present illness/assessment and plan.  Exam: [name] assistant Filed Vitals:   07/14/14 1531  BP: 130/76   General appearance:  Normal Abdomen soft nontender without masses guarding rebound Pelvic external BUS vagina with incision lines healing nicely. Cuff intact. Bimanual without masses or tenderness  Assessment/Plan:  64 y.o. G2P2 with normal postoperative visit status post TVH anterior and posterior colporrhaphy. Slowly resume activities with continued pelvic rest and no straining or heavy lifting. Follow up for next postoperative visit in 2 weeks.    Anastasio Auerbach MD, 3:40 PM 07/14/2014

## 2014-07-14 NOTE — Patient Instructions (Signed)
Follow-up in 2 weeks for your next postoperative visit. 

## 2014-07-15 ENCOUNTER — Ambulatory Visit: Payer: Medicare Other | Admitting: Gynecology

## 2014-07-30 ENCOUNTER — Encounter (HOSPITAL_COMMUNITY): Payer: Self-pay | Admitting: Gynecology

## 2014-08-03 ENCOUNTER — Ambulatory Visit (INDEPENDENT_AMBULATORY_CARE_PROVIDER_SITE_OTHER): Payer: Medicare Other | Admitting: Gynecology

## 2014-08-03 ENCOUNTER — Other Ambulatory Visit: Payer: Self-pay | Admitting: Family Medicine

## 2014-08-03 ENCOUNTER — Encounter: Payer: Self-pay | Admitting: Gynecology

## 2014-08-03 VITALS — BP 120/82 | Temp 98.2°F

## 2014-08-03 DIAGNOSIS — M5489 Other dorsalgia: Secondary | ICD-10-CM

## 2014-08-03 DIAGNOSIS — Z9889 Other specified postprocedural states: Secondary | ICD-10-CM

## 2014-08-03 DIAGNOSIS — M549 Dorsalgia, unspecified: Secondary | ICD-10-CM

## 2014-08-03 LAB — URINALYSIS W MICROSCOPIC + REFLEX CULTURE
BILIRUBIN URINE: NEGATIVE
CASTS: NONE SEEN [LPF]
Crystals: NONE SEEN [HPF]
Glucose, UA: NEGATIVE
Ketones, ur: NEGATIVE
Nitrite: NEGATIVE
PH: 5 (ref 5.0–8.0)
Specific Gravity, Urine: >1.03 (ref 1.001–1.035)
Yeast: NONE SEEN [HPF]

## 2014-08-03 MED ORDER — CIPROFLOXACIN HCL 250 MG PO TABS: 250.0000 mg | ORAL_TABLET | Freq: Two times a day (BID) | ORAL | Status: DC

## 2014-08-03 NOTE — Addendum Note (Signed)
Addended by: Nelva Nay on: 08/03/2014 03:42 PM   Modules accepted: Orders

## 2014-08-03 NOTE — Patient Instructions (Signed)
Take the antibiotic twice daily for 7 days. Follow up if urinary complaints continue or worsen. Follow up at the end of the year for annual exam.

## 2014-08-03 NOTE — Progress Notes (Signed)
Anna Marquez 28-Aug-1950 [id number]        64 y.o.  G2P2 presents for her postoperative visit status post [facility] anterior-posterior repair 06/14/2014. Patient has done well but does note a low-grade fever over the last several days of 100.0 and some low back pain. Otherwise doing well without nausea vomiting diarrhea constipation. Voiding without difficulty.  Past medical history,surgical history, problem list, medications, allergies, family history and social history were all reviewed and documented in the EPIC chart.  Directed ROS with pertinent positives and negatives documented in the history of present illness/assessment and plan.  Exam: [name] assistant Filed Vitals:   08/03/14 1407  BP: 120/82  Temp: 98.2 F (36.8 C)  TempSrc: Oral   General appearance:  Normal Spine straight without CVA tenderness Abdomen soft nontender without masses guarding rebound Pelvic external BUS vagina with anterior posterior vaginal incisions healed nicely. Cuff with several remaining sutures. Nontender without masses.  Assessment/Plan:  64 y.o. G2P2 with UTI her urinalysis showing 20-40 WBC many bacteria 10-20 RBC. She does show some squamous cells. Will cover with  Ciprofloxacin 250 mg twice a day 7 days. Follow up if her symptoms persist worsen or change. Otherwise patient will slowly resume all normal activities and resume intercourse in 2 weeks to allow complete healing of the cuff. Follow up end of this year beginning of next year when due for annual exam.    Anastasio Auerbach MD, 2:29 PM 08/03/2014

## 2014-08-04 ENCOUNTER — Other Ambulatory Visit: Payer: Self-pay | Admitting: Family Medicine

## 2014-08-04 ENCOUNTER — Encounter: Payer: Self-pay | Admitting: Internal Medicine

## 2014-08-04 DIAGNOSIS — I10 Essential (primary) hypertension: Secondary | ICD-10-CM

## 2014-08-04 MED ORDER — NADOLOL 20 MG PO TABS: ORAL_TABLET | ORAL | Status: DC

## 2014-08-04 NOTE — Telephone Encounter (Signed)
Medication was changed per patient request due to Insurance preference.

## 2014-08-05 LAB — URINE CULTURE

## 2014-10-10 ENCOUNTER — Encounter: Payer: Self-pay | Admitting: Family Medicine

## 2014-12-15 ENCOUNTER — Ambulatory Visit (INDEPENDENT_AMBULATORY_CARE_PROVIDER_SITE_OTHER): Payer: Medicare Other | Admitting: Family Medicine

## 2014-12-15 ENCOUNTER — Encounter: Payer: Self-pay | Admitting: Family Medicine

## 2014-12-15 VITALS — BP 126/90 | HR 98 | Temp 98.1°F | Ht 64.0 in | Wt 167.5 lb

## 2014-12-15 DIAGNOSIS — J209 Acute bronchitis, unspecified: Secondary | ICD-10-CM

## 2014-12-15 DIAGNOSIS — L853 Xerosis cutis: Secondary | ICD-10-CM | POA: Diagnosis not present

## 2014-12-15 DIAGNOSIS — I10 Essential (primary) hypertension: Secondary | ICD-10-CM

## 2014-12-15 MED ORDER — PREDNISONE 10 MG PO TABS: ORAL_TABLET | ORAL | Status: DC

## 2014-12-15 MED ORDER — ALBUTEROL SULFATE HFA 108 (90 BASE) MCG/ACT IN AERS: 2.0000 | INHALATION_SPRAY | Freq: Four times a day (QID) | RESPIRATORY_TRACT | Status: DC | PRN

## 2014-12-15 NOTE — Progress Notes (Signed)
HPI:  Anna Marquez is a pleasant 64 yo with a reported hx of asthma and allergies here for an acute visit for several issues:  1)cough/wheeze: -started several days ago -symptoms: nasal congestion, PND, cough, wheezing -denies: fevers, SOB, malaise, NVD, sinus pain, flu exposure, body aches -reports gets this every year and PCP give alb and prednisone for her asthma  2)rash: -started about 1 month ago -uses BBW bathwash - scented  -dry itchy skin extremities and trunk -denies fevers, malaise, new exposures  3) BP elevated on arrival: -reports did not take meds today -denies CP, swelling, HA  ROS: See pertinent positives and negatives per HPI.  Past Medical History  Diagnosis Date  . Diverticulosis of colon   . GERD (gastroesophageal reflux disease)   . Nephrolithiasis   . Osteoarthritis   . Mitral valve prolapse   . Adenomatous colon polyp 2003  . Hiatal hernia   . Cholelithiasis   . Helicobacter pylori (H. pylori)   . Hypertension   . Gastric polyps   . Osteopenia 04/28/2014    T score -1.5 FRAX 8.2%/0.7%  . daughter had blood clots in lungs from anesthesiae     Past Surgical History  Procedure Laterality Date  . Kidney stone removal      x2  . Cholecystectomy    . Vaginal hysterectomy N/A 06/14/2014    Procedure: HYSTERECTOMY VAGINAL;  Surgeon: Anastasio Auerbach, MD;  Location: [facility];  Service: Gynecology;  Laterality: N/A;  . Anterior and posterior repair N/A 06/14/2014    Procedure: ANTERIOR (CYSTOCELE) AND POSTERIOR REPAIR (RECTOCELE);  Surgeon: Anastasio Auerbach, MD;  Location: [facility];  Service: Gynecology;  Laterality: N/A;    Family History  Problem Relation Age of Onset  . Cancer Father     prostate  . Breast cancer Paternal Aunt 12  . Diabetes Maternal Grandmother   . Heart disease Maternal Grandmother   . Heart disease Maternal Grandfather   . Melanoma Paternal Aunt     Social History   Social History  . Marital Status: Divorced   Spouse Name: N/A  . Number of Children: N/A  . Years of Education: N/A   Social History Main Topics  . Smoking status: Never Smoker   . Smokeless tobacco: Never Used  . Alcohol Use: 0.0 oz/week    0 Standard drinks or equivalent per week     Comment: Occas  . Drug Use: No  . Sexual Activity: Yes    Birth Control/ Protection: Post-menopausal     Comment: 1st intercourse 64 yo-Fewer than 5 partners   Other Topics Concern  . None   Social History Narrative     Current outpatient prescriptions:  .  albuterol (PROVENTIL HFA;VENTOLIN HFA) 108 (90 BASE) MCG/ACT inhaler, Inhale 2 puffs into the lungs every 6 (six) hours as needed. For shortness of breath or wheezing, Disp: 1 Inhaler, Rfl: 0 .  aspirin 325 MG tablet, Take 325 mg by mouth daily., Disp: , Rfl:  .  fluticasone (FLONASE) 50 MCG/ACT nasal spray, USE ONE SPRAY IN EACH NOSTRIL DAILY. (Patient taking differently: Place 1 spray into both nostrils daily as needed for allergies. USE ONE SPRAY IN EACH NOSTRIL DAILY.), Disp: 32 g, Rfl: 6 .  hydrochlorothiazide (HYDRODIURIL) 25 MG tablet, Take 1 tablet (25 mg total) by mouth daily as needed. For fluid retention (Patient taking differently: Take 25 mg by mouth daily as needed (fluid retention). For fluid retention), Disp: 100 tablet, Rfl: 3 .  ibuprofen (ADVIL,MOTRIN)  200 MG tablet, Take 400 mg by mouth daily as needed for mild pain., Disp: , Rfl:  .  lisinopril (PRINIVIL,ZESTRIL) 5 MG tablet, Take 1 tablet (5 mg total) by mouth daily., Disp: 90 tablet, Rfl: 3 .  loratadine (CLARITIN) 10 MG tablet, Take 10 mg by mouth daily as needed for allergies. For allergies, Disp: , Rfl:  .  nadolol (CORGARD) 20 MG tablet, Take 2 tablets (40mg ) twice daily, Disp: 200 tablet, Rfl: 3 .  oxyCODONE-acetaminophen (PERCOCET/ROXICET) 5-325 MG per tablet, Take 1-2 tablets by mouth every 4 (four) hours as needed (moderate to severe pain (when tolerating fluids))., Disp: 30 tablet, Rfl: 0 .  pantoprazole  (PROTONIX) 40 MG tablet, Take 1 tablet (40 mg total) by mouth daily., Disp: 90 tablet, Rfl: 3 .  Probiotic Product (PROBIOTIC PO), Take 1 tablet by mouth daily., Disp: , Rfl:  .  rizatriptan (MAXALT-MLT) 5 MG disintegrating tablet, Take 1 tablet (5 mg total) by mouth as needed for migraine. May repeat in 2 hours if needed, Disp: 10 tablet, Rfl: 0 .  sucralfate (CARAFATE) 1 G tablet, TAKE ONE TABLET BY MOUTH TWICE DAILY, Disp: 200 tablet, Rfl: 1 .  predniSONE (DELTASONE) 10 MG tablet, 30mg  (3tabs) daily for 2 days, then 20mg  (2 tab) daily x 2 days, then 10mg  (1tab) daily x2 days, Disp: 12 tablet, Rfl: 0  EXAM:  Filed Vitals:   12/15/14 0940  BP: 126/90  Pulse: 98  Temp: 98.1 F (36.7 C)    Body mass index is 28.74 kg/(m^2).  GENERAL: vitals reviewed and listed above, alert, oriented, appears well hydrated and in no acute distress  HEENT: atraumatic, conjunttiva clear, no obvious abnormalities on inspection of external nose and ears, normal appearance of ear canals and TMs, clear nasal congestion, mild post oropharyngeal erythema with PND, no tonsillar edema or exudate, no sinus TTP  NECK: no obvious masses on inspection  LUNGS: clear to auscultation bilaterally, scattered exp wheeze  CV: HRRR, no peripheral edema  MS: moves all extremities without noticeable abnormality  SKIN: dry skin throughout, irritated skin in scattered areas, scattered small erythematous papules on trunk  PSYCH: pleasant and cooperative, no obvious depression or anxiety  ASSESSMENT AND PLAN:  Discussed the following assessment and plan:  Acute bronchitis, unspecified organism -tx with prednisone and refilled alb, return precuations  Xerosis of skin -very dry skin and possible contact dermatitis -steroid per above, hypoallergenic skin regimen, ceramide skin cream -follow up in 1 month   Essential hypertension -better on recheck, she did not take meds, advised recheck with pcp in 1 month  -Patient  advised to return or notify a doctor immediately if symptoms worsen or persist or new concerns arise.  Patient Instructions  BEFORE YOU LEAVE: -schedule follow up with Dr. Sherren Mocha in 1 month  Take the prednisone and use albuterol as needed  Hypoallergenic soap, laundry soap and skin products - no scent  Cerave or Cetaphil cream daily  Acute Bronchitis Bronchitis is inflammation of the airways that extend from the windpipe into the lungs (bronchi). The inflammation often causes mucus to develop. This leads to a cough, which is the most common symptom of bronchitis.  In acute bronchitis, the condition usually develops suddenly and goes away over time, usually in a couple weeks. Smoking, allergies, and asthma can make bronchitis worse. Repeated episodes of bronchitis may cause further lung problems.  CAUSES Acute bronchitis is most often caused by the same virus that causes a cold. The virus can spread from person  to person (contagious) through coughing, sneezing, and touching contaminated objects. SIGNS AND SYMPTOMS   Cough.   Fever.   Coughing up mucus.   Body aches.   Chest congestion.   Chills.   Shortness of breath.   Sore throat.  DIAGNOSIS  Acute bronchitis is usually diagnosed through a physical exam. Your health care provider will also ask you questions about your medical history. Tests, such as chest X-rays, are sometimes done to rule out other conditions.  TREATMENT  Acute bronchitis usually goes away in a couple weeks. Oftentimes, no medical treatment is necessary. Medicines are sometimes given for relief of fever or cough. Antibiotic medicines are usually not needed but may be prescribed in certain situations. In some cases, an inhaler may be recommended to help reduce shortness of breath and control the cough. A cool mist vaporizer may also be used to help thin bronchial secretions and make it easier to clear the chest.  HOME CARE INSTRUCTIONS  Get plenty of  rest.   Drink enough fluids to keep your urine clear or pale yellow (unless you have a medical condition that requires fluid restriction). Increasing fluids may help thin your respiratory secretions (sputum) and reduce chest congestion, and it will prevent dehydration.   Take medicines only as directed by your health care provider.  If you were prescribed an antibiotic medicine, finish it all even if you start to feel better.  Avoid smoking and secondhand smoke. Exposure to cigarette smoke or irritating chemicals will make bronchitis worse. If you are a smoker, consider using nicotine gum or skin patches to help control withdrawal symptoms. Quitting smoking will help your lungs heal faster.   Reduce the chances of another bout of acute bronchitis by washing your hands frequently, avoiding people with cold symptoms, and trying not to touch your hands to your mouth, nose, or eyes.   Keep all follow-up visits as directed by your health care provider.  SEEK MEDICAL CARE IF: Your symptoms do not improve after 1 week of treatment.  SEEK IMMEDIATE MEDICAL CARE IF:  You develop an increased fever or chills.   You have chest pain.   You have severe shortness of breath.  You have bloody sputum.   You develop dehydration.  You faint or repeatedly feel like you are going to pass out.  You develop repeated vomiting.  You develop a severe headache. MAKE SURE YOU:   Understand these instructions.  Will watch your condition.  Will get help right away if you are not doing well or get worse.   This information is not intended to replace advice given to you by your health care provider. Make sure you discuss any questions you have with your health care provider.   Document Released: 02/01/2004 Document Revised: 01/14/2014 Document Reviewed: 06/16/2012 Elsevier Interactive Patient Education 2016 [place], [place]

## 2014-12-15 NOTE — Patient Instructions (Signed)
BEFORE YOU LEAVE: -schedule follow up with Dr. Sherren Mocha in 1 month  Take the prednisone and use albuterol as needed  Hypoallergenic soap, laundry soap and skin products - no scent  Cerave or Cetaphil cream daily  Acute Bronchitis Bronchitis is inflammation of the airways that extend from the windpipe into the lungs (bronchi). The inflammation often causes mucus to develop. This leads to a cough, which is the most common symptom of bronchitis.  In acute bronchitis, the condition usually develops suddenly and goes away over time, usually in a couple weeks. Smoking, allergies, and asthma can make bronchitis worse. Repeated episodes of bronchitis may cause further lung problems.  CAUSES Acute bronchitis is most often caused by the same virus that causes a cold. The virus can spread from person to person (contagious) through coughing, sneezing, and touching contaminated objects. SIGNS AND SYMPTOMS   Cough.   Fever.   Coughing up mucus.   Body aches.   Chest congestion.   Chills.   Shortness of breath.   Sore throat.  DIAGNOSIS  Acute bronchitis is usually diagnosed through a physical exam. Your health care provider will also ask you questions about your medical history. Tests, such as chest X-rays, are sometimes done to rule out other conditions.  TREATMENT  Acute bronchitis usually goes away in a couple weeks. Oftentimes, no medical treatment is necessary. Medicines are sometimes given for relief of fever or cough. Antibiotic medicines are usually not needed but may be prescribed in certain situations. In some cases, an inhaler may be recommended to help reduce shortness of breath and control the cough. A cool mist vaporizer may also be used to help thin bronchial secretions and make it easier to clear the chest.  HOME CARE INSTRUCTIONS  Get plenty of rest.   Drink enough fluids to keep your urine clear or pale yellow (unless you have a medical condition that requires fluid  restriction). Increasing fluids may help thin your respiratory secretions (sputum) and reduce chest congestion, and it will prevent dehydration.   Take medicines only as directed by your health care provider.  If you were prescribed an antibiotic medicine, finish it all even if you start to feel better.  Avoid smoking and secondhand smoke. Exposure to cigarette smoke or irritating chemicals will make bronchitis worse. If you are a smoker, consider using nicotine gum or skin patches to help control withdrawal symptoms. Quitting smoking will help your lungs heal faster.   Reduce the chances of another bout of acute bronchitis by washing your hands frequently, avoiding people with cold symptoms, and trying not to touch your hands to your mouth, nose, or eyes.   Keep all follow-up visits as directed by your health care provider.  SEEK MEDICAL CARE IF: Your symptoms do not improve after 1 week of treatment.  SEEK IMMEDIATE MEDICAL CARE IF:  You develop an increased fever or chills.   You have chest pain.   You have severe shortness of breath.  You have bloody sputum.   You develop dehydration.  You faint or repeatedly feel like you are going to pass out.  You develop repeated vomiting.  You develop a severe headache. MAKE SURE YOU:   Understand these instructions.  Will watch your condition.  Will get help right away if you are not doing well or get worse.   This information is not intended to replace advice given to you by your health care provider. Make sure you discuss any questions you have with your health  care provider.   Document Released: 02/01/2004 Document Revised: 01/14/2014 Document Reviewed: 06/16/2012 Elsevier Interactive Patient Education [employer].

## 2014-12-15 NOTE — Progress Notes (Signed)
Pre visit review using our clinic review tool, if applicable. No additional management support is needed unless otherwise documented below in the visit note. 

## 2015-01-06 ENCOUNTER — Telehealth: Payer: Self-pay | Admitting: Family Medicine

## 2015-01-06 NOTE — Telephone Encounter (Signed)
Noted  

## 2015-01-06 NOTE — Telephone Encounter (Signed)
Patient Name: BRANTLEE ZEIER  DOB: 05/07/1950    Initial Comment Caller has congestion and ear pain. Is there an over the counter?    Nurse Assessment  Nurse: Raphael Gibney, RN, Vanita Ingles Date/Time Eilene Ghazi Time): 01/06/2015 11:21:07 AM  Confirm and document reason for call. If symptomatic, describe symptoms. ---Caller states she has nasal congestion and chest congestion. symptoms started 5 days ago. No fever. She is having pain in her right ear. she is coughing. Taking mucinex and ASA which is not helping.  Has the patient traveled out of the country within the last 30 days? ---No  Does the patient have any new or worsening symptoms? ---Yes  Will a triage be completed? ---Yes  Related visit to physician within the last 2 weeks? ---No  Does the PT have any chronic conditions? (i.e. diabetes, asthma, etc.) ---Yes  List chronic conditions. ---asthma; HTN  Is this a behavioral health or substance abuse call? ---No     Guidelines    Guideline Title Affirmed Question Affirmed Notes  Cough - Acute Non-Productive Earache is present    Final Disposition User   See Physician within 24 Hours [place], RN, Vanita Ingles    Comments  No appts available at [employer] pt of [facility] at [facility] and provided telephone number.   Referrals  [facility]   Disagree/Comply: Comply

## 2015-01-30 ENCOUNTER — Ambulatory Visit: Payer: Self-pay | Admitting: Family Medicine

## 2015-05-25 ENCOUNTER — Encounter: Payer: Medicare Other | Admitting: Gynecology

## 2015-06-01 ENCOUNTER — Other Ambulatory Visit: Payer: Self-pay | Admitting: Family Medicine

## 2015-07-12 ENCOUNTER — Encounter: Payer: Medicare Other | Admitting: Gynecology

## 2015-07-20 ENCOUNTER — Other Ambulatory Visit: Payer: Self-pay | Admitting: Family Medicine

## 2015-07-20 DIAGNOSIS — Z1231 Encounter for screening mammogram for malignant neoplasm of breast: Secondary | ICD-10-CM

## 2015-08-07 ENCOUNTER — Ambulatory Visit (HOSPITAL_BASED_OUTPATIENT_CLINIC_OR_DEPARTMENT_OTHER)
Admission: RE | Admit: 2015-08-07 | Discharge: 2015-08-07 | Disposition: A | Discharge status: Home / Self Care | Payer: PPO | Source: Ambulatory Visit | Attending: Family Medicine | Admitting: Family Medicine

## 2015-08-07 DIAGNOSIS — Z1231 Encounter for screening mammogram for malignant neoplasm of breast: Secondary | ICD-10-CM | POA: Diagnosis not present

## 2015-08-20 ENCOUNTER — Observation Stay (HOSPITAL_COMMUNITY)
Admission: EM | Admit: 2015-08-20 | Discharge: 2015-08-21 | Disposition: A | Discharge status: Home / Self Care | Payer: PPO | Attending: Internal Medicine | Admitting: Internal Medicine

## 2015-08-20 ENCOUNTER — Emergency Department (HOSPITAL_COMMUNITY): Payer: PPO

## 2015-08-20 ENCOUNTER — Encounter (HOSPITAL_COMMUNITY): Payer: Self-pay | Admitting: Emergency Medicine

## 2015-08-20 DIAGNOSIS — K219 Gastro-esophageal reflux disease without esophagitis: Secondary | ICD-10-CM | POA: Diagnosis present

## 2015-08-20 DIAGNOSIS — Z87442 Personal history of urinary calculi: Secondary | ICD-10-CM | POA: Diagnosis not present

## 2015-08-20 DIAGNOSIS — E785 Hyperlipidemia, unspecified: Secondary | ICD-10-CM | POA: Insufficient documentation

## 2015-08-20 DIAGNOSIS — I10 Essential (primary) hypertension: Secondary | ICD-10-CM | POA: Diagnosis not present

## 2015-08-20 DIAGNOSIS — R410 Disorientation, unspecified: Secondary | ICD-10-CM | POA: Diagnosis not present

## 2015-08-20 DIAGNOSIS — R10A2 Flank pain, left side: Secondary | ICD-10-CM | POA: Diagnosis present

## 2015-08-20 DIAGNOSIS — G454 Transient global amnesia: Principal | ICD-10-CM | POA: Diagnosis present

## 2015-08-20 DIAGNOSIS — I4891 Unspecified atrial fibrillation: Secondary | ICD-10-CM | POA: Diagnosis not present

## 2015-08-20 DIAGNOSIS — G9340 Encephalopathy, unspecified: Secondary | ICD-10-CM | POA: Diagnosis not present

## 2015-08-20 DIAGNOSIS — I480 Paroxysmal atrial fibrillation: Secondary | ICD-10-CM | POA: Diagnosis not present

## 2015-08-20 DIAGNOSIS — G43009 Migraine without aura, not intractable, without status migrainosus: Secondary | ICD-10-CM | POA: Diagnosis not present

## 2015-08-20 DIAGNOSIS — Z7982 Long term (current) use of aspirin: Secondary | ICD-10-CM | POA: Insufficient documentation

## 2015-08-20 DIAGNOSIS — R109 Unspecified abdominal pain: Secondary | ICD-10-CM | POA: Diagnosis not present

## 2015-08-20 DIAGNOSIS — R079 Chest pain, unspecified: Secondary | ICD-10-CM | POA: Diagnosis not present

## 2015-08-20 DIAGNOSIS — R4182 Altered mental status, unspecified: Secondary | ICD-10-CM | POA: Diagnosis not present

## 2015-08-20 DIAGNOSIS — I340 Nonrheumatic mitral (valve) insufficiency: Secondary | ICD-10-CM | POA: Diagnosis present

## 2015-08-20 DIAGNOSIS — I4819 Other persistent atrial fibrillation: Secondary | ICD-10-CM | POA: Diagnosis present

## 2015-08-20 DIAGNOSIS — Z79899 Other long term (current) drug therapy: Secondary | ICD-10-CM | POA: Diagnosis not present

## 2015-08-20 LAB — COMPREHENSIVE METABOLIC PANEL
ALT: 21 U/L (ref 14–54)
AST: 19 U/L (ref 15–41)
Albumin: 4.6 g/dL (ref 3.5–5.0)
Alkaline Phosphatase: 55 U/L (ref 38–126)
Anion gap: 10 (ref 5–15)
BUN: 15 mg/dL (ref 6–20)
CO2: 21 mmol/L — ABNORMAL LOW (ref 22–32)
Calcium: 9.5 mg/dL (ref 8.9–10.3)
Chloride: 107 mmol/L (ref 101–111)
Creatinine, Ser: 0.85 mg/dL (ref 0.44–1.00)
GFR calc Af Amer: >60 mL/min (ref >60)
GFR calc non Af Amer: >60 mL/min (ref >60)
Glucose, Bld: 106 mg/dL — ABNORMAL HIGH (ref 65–99)
Potassium: 3.5 mmol/L (ref 3.5–5.1)
Sodium: 138 mmol/L (ref 135–145)
Total Bilirubin: 2.4 mg/dL — ABNORMAL HIGH (ref 0.3–1.2)
Total Protein: 8.1 g/dL (ref 6.5–8.1)

## 2015-08-20 LAB — CBC WITH DIFFERENTIAL/PLATELET
Basophils Absolute: 0 10*3/uL (ref 0.0–0.1)
Basophils Relative: 0 %
Eosinophils Absolute: 0.1 10*3/uL (ref 0.0–0.7)
Eosinophils Relative: 1 %
HCT: 37.5 % (ref 36.0–46.0)
Hemoglobin: 13.6 g/dL (ref 12.0–15.0)
Lymphocytes Relative: 19 %
Lymphs Abs: 1.7 10*3/uL (ref 0.7–4.0)
MCH: 30.7 pg (ref 26.0–34.0)
MCHC: 36.3 g/dL — ABNORMAL HIGH (ref 30.0–36.0)
MCV: 84.7 fL (ref 78.0–100.0)
Monocytes Absolute: 0.5 10*3/uL (ref 0.1–1.0)
Monocytes Relative: 6 %
Neutro Abs: 6.6 10*3/uL (ref 1.7–7.7)
Neutrophils Relative %: 74 %
Platelets: 159 10*3/uL (ref 150–400)
RBC: 4.43 MIL/uL (ref 3.87–5.11)
RDW: 12.8 % (ref 11.5–15.5)
WBC: 9 10*3/uL (ref 4.0–10.5)

## 2015-08-20 LAB — I-STAT TROPONIN, ED: Troponin i, poc: 0 ng/mL (ref 0.00–0.08)

## 2015-08-20 LAB — CBG MONITORING, ED: Glucose-Capillary: 108 mg/dL — ABNORMAL HIGH (ref 65–99)

## 2015-08-20 MED ORDER — SODIUM CHLORIDE 0.9 % IV BOLUS (SEPSIS)
1000.0000 mL | Freq: Once | INTRAVENOUS | Status: AC
  Administered 2015-08-20: 1000 mL via INTRAVENOUS

## 2015-08-20 MED ORDER — IOPAMIDOL (ISOVUE-370) INJECTION 76%
100.0000 mL | Freq: Once | INTRAVENOUS | Status: AC | PRN
  Administered 2015-08-20: 100 mL via INTRAVENOUS

## 2015-08-20 NOTE — ED Notes (Signed)
Pt's husband comes out and states "There is something wrong with wife. She cannot remember anything"; Husband states that he went to move his truck and came back and states that his wife is very confused; pt unable to states day, month or year; pt states that she is unaware why she is here; pt tearful and crying and keeps repeating the same question; no weakness noted to extremities; pt states "My head feels funny"; Pt denies flank pain currentlly

## 2015-08-20 NOTE — H&P (Signed)
History and Physical    Anna Marquez [id number] DOB: May 30, 1950 DOA: 08/20/2015  Referring MD/NP/PA:   PCP: Joycelyn Man, MD   Patient coming from:  The patient is coming from home.  At baseline, pt is independent for most of ADL.   Chief Complaint: Altered mental status, left flank pain  HPI: Anna Marquez is a 65 y.o. female with medical history significant of atrial fibrillation not on anticoagulants, hypertension, GERD, kidney stone, mitral valve prolapse who presents with altered mental status, left flank pain.  Per her daughter, pt started having left flank pain at about 6 PM. It is associated with nausea and dry heave. No diarrhea. The pain is constant, 7 out of 10 in severity, nonradiating. It is not aggravated or alleviated by any known factors. Patient has hematuria. Denies symptoms of UTI. Patient was noted to be confused and disoriented. She is asking same questions over and over and was unable to remember on a short-term basis. Patient does not have unilateral weakness, numbness or tingling in her extremities. No vision change or hearing loss. Patient does not have chest pain, shortness breath, fever or chills. She has no previous history of memory abnormalities.  ED Course: pt was found to have negative troponin, urinalysis was small amount of leukocyte and a sulfa red blood cells, temperature normal, no tachycardia, electrolytes and renal function okay. CT head is negative for acute intracranial abnormalities. CT angiogram of the chest/abdomen/pelvis showed no evidence of aneurysm or dissection in the thoracic or abdominal aorta. No evidence of significant pulmonary embolus. Incidental note of diffusely enlarged and partially calcified thyroid gland with multiple nodules, likely due to goiter. Mosaic attenuation pattern lungs may be due to air trapping, edema, or infiltration. Diffuse fatty infiltration of the liver. Pt is placed on tele bed for obs. Neurology, Dr. Nicole Kindred  was consulted.  Review of Systems: Could not be reviewed accurately due to altered mental status.  Allergy:  Allergies  Allergen Reactions  . Ibuprofen Anaphylaxis    Pt/Family report "anaphylactic" type of reaction to ibuprofen. Tolerates daily 325 ASA at home  . Penicillins Hives and Shortness Of Breath    Has patient had a PCN reaction causing immediate rash, facial/tongue/throat swelling, SOB or lightheadedness with hypotension: yes Has patient had a PCN reaction causing severe rash involving mucus membranes or skin necrosis: no Has patient had a PCN reaction that required hospitalization: unknown Has patient had a PCN reaction occurring within the last 10 years: no If all of the above answers are "NO", then may proceed with Cephalosporin use.   . Erythromycin Nausea And Vomiting    REACTION: Stomach upset    Past Medical History:  Diagnosis Date  . Adenomatous colon polyp 2003  . Cholelithiasis   . daughter had blood clots in lungs from anesthesiae   . Diverticulosis of colon   . Gastric polyps   . GERD (gastroesophageal reflux disease)   . Helicobacter pylori (H. pylori)   . Hiatal hernia   . Hypertension   . Mitral valve prolapse   . Nephrolithiasis   . Osteoarthritis   . Osteopenia 04/28/2014   T score -1.5 FRAX 8.2%/0.7%    Past Surgical History:  Procedure Laterality Date  . ANTERIOR AND POSTERIOR REPAIR N/A 06/14/2014   Procedure: ANTERIOR (CYSTOCELE) AND POSTERIOR REPAIR (RECTOCELE);  Surgeon: Anastasio Auerbach, MD;  Location: [facility];  Service: Gynecology;  Laterality: N/A;  . CHOLECYSTECTOMY    . kidney stone removal  x2  . VAGINAL HYSTERECTOMY N/A 06/14/2014   Procedure: HYSTERECTOMY VAGINAL;  Surgeon: Anastasio Auerbach, MD;  Location: [facility];  Service: Gynecology;  Laterality: N/A;    Social History:  reports that she has never smoked. She has never used smokeless tobacco. She reports that she drinks alcohol. She reports that she does not use  drugs.  Family History:  Family History  Problem Relation Age of Onset  . Cancer Father     prostate  . Breast cancer Paternal Aunt 85  . Diabetes Maternal Grandmother   . Heart disease Maternal Grandmother   . Heart disease Maternal Grandfather   . Melanoma Paternal Aunt      Prior to Admission medications   Medication Sig Start Date End Date Taking? Authorizing Provider  albuterol (PROVENTIL HFA;VENTOLIN HFA) 108 (90 BASE) MCG/ACT inhaler Inhale 2 puffs into the lungs every 6 (six) hours as needed. For shortness of breath or wheezing 12/15/14  Yes Lucretia Kern, DO  aspirin 325 MG tablet Take 325 mg by mouth daily as needed (afib, chest pains).    Yes Historical Provider, MD  fluticasone (FLONASE) 50 MCG/ACT nasal spray USE ONE SPRAY IN EACH NOSTRIL DAILY. Patient taking differently: Place 1 spray into both nostrils daily as needed for allergies. USE ONE SPRAY IN EACH NOSTRIL DAILY. 02/25/13  Yes Dorena Cookey, MD  hydrochlorothiazide (HYDRODIURIL) 25 MG tablet Take 1 tablet (25 mg total) by mouth daily as needed. For fluid retention Patient taking differently: Take 25 mg by mouth daily. For fluid retention 05/02/14  Yes Dorena Cookey, MD  ibuprofen (ADVIL,MOTRIN) 200 MG tablet Take 400 mg by mouth daily as needed for mild pain.   Yes Historical Provider, MD  lisinopril (PRINIVIL,ZESTRIL) 5 MG tablet TAKE 1 TABLET BY MOUTH EVERY DAY Patient taking differently: TAKE 5mg  TABLET BY MOUTH EVERY DAY 06/01/15  Yes Dorena Cookey, MD  loratadine (CLARITIN) 10 MG tablet Take 10 mg by mouth daily as needed for allergies. For allergies   Yes Historical Provider, MD  nadolol (CORGARD) 40 MG tablet Take 40 mg by mouth 2 (two) times daily. 06/01/15  Yes Historical Provider, MD  oxyCODONE-acetaminophen (PERCOCET/ROXICET) 5-325 MG per tablet Take 1-2 tablets by mouth every 4 (four) hours as needed (moderate to severe pain (when tolerating fluids)). 06/15/14  Yes Anastasio Auerbach, MD  pantoprazole  (PROTONIX) 40 MG tablet TAKE 1 TABLET BY MOUTH EVERY DAY Patient taking differently: TAKE 40 mg TABLET BY MOUTH EVERY DAY 06/01/15  Yes Dorena Cookey, MD  Probiotic Product (PROBIOTIC PO) Take 1 tablet by mouth daily.   Yes Historical Provider, MD  rizatriptan (MAXALT-MLT) 5 MG disintegrating tablet Take 1 tablet (5 mg total) by mouth as needed for migraine. May repeat in 2 hours if needed 02/25/13  Yes Dorena Cookey, MD  sucralfate (CARAFATE) 1 G tablet TAKE ONE TABLET BY MOUTH TWICE DAILY Patient taking differently: TAKE 1 g TABLET BY MOUTH  DAILY 08/03/14  Yes Dorena Cookey, MD  nadolol (CORGARD) 20 MG tablet Take 2 tablets (40mg ) twice daily Patient not taking: Reported on 08/20/2015 08/04/14   Dorena Cookey, MD    Physical Exam: Vitals:   08/21/15 0110 08/21/15 0200 08/21/15 0230 08/21/15 0430  BP: 142/84  (!) 156/90 119/62  Pulse: 71  63 67  Resp: 21  18 17   Temp:   99.1 F (37.3 C) 98.3 F (36.8 C)  TempSrc:   Oral Oral  SpO2: 91%  96% 94%  Weight:  73.7 kg (162 lb 8 oz)    Height:  5\' 4"  (1.626 m)     General: Not in acute distress HEENT:       Eyes: PERRL, EOMI, no scleral icterus.       ENT: No discharge from the ears and nose, no pharynx injection, no tonsillar enlargement.        Neck: No JVD, no bruit, no mass felt. Heme: No neck lymph node enlargement. Cardiac: S1/S2, RRR, No murmurs, No gallops or rubs. Pulm:  No rales, wheezing, rhonchi or rubs. Abd: Soft, nondistended, nontender, no rebound pain, no organomegaly, BS present. GU: has hematuria. Has left CVA tenderness. Ext: No pitting leg edema bilaterally. 2+DP/PT pulse bilaterally. Musculoskeletal: No joint deformities, No joint redness or warmth, no limitation of ROM in spin. Skin: No rashes.  Neuro: confused, oriented to person and place, not to time, cranial nerves II-XII grossly intact, moves all extremities normally. Muscle strength 5/5 in all extremities, sensation to light touch intact. Knee reflex 1+  bilaterally. Negative Babinski's sign. Normal finger to nose test. Psych: Patient is not psychotic, no suicidal or hemocidal ideation.  Labs on Admission: I have personally reviewed following labs and imaging studies  CBC:  Recent Labs Lab 08/20/15 2204  WBC 9.0  NEUTROABS 6.6  HGB 13.6  HCT 37.5  MCV 84.7  PLT [id number]   Basic Metabolic Panel:  Recent Labs Lab 08/20/15 2204  NA 138  K 3.5  CL 107  CO2 21*  GLUCOSE 106*  BUN 15  CREATININE 0.85  CALCIUM 9.5   GFR: Estimated Creatinine Clearance: 64 mL/min (by C-G formula based on SCr of 0.85 mg/dL). Liver Function Tests:  Recent Labs Lab 08/20/15 2204  AST 19  ALT 21  ALKPHOS 55  BILITOT 2.4*  PROT 8.1  ALBUMIN 4.6    Recent Labs Lab 08/20/15 2204  LIPASE 25   No results for input(s): AMMONIA in the last 168 hours. Coagulation Profile:  Recent Labs Lab 08/20/15 2204  INR 0.99   Cardiac Enzymes: No results for input(s): CKTOTAL, CKMB, CKMBINDEX, TROPONINI in the last 168 hours. BNP (last 3 results) No results for input(s): PROBNP in the last 8760 hours. HbA1C: No results for input(s): HGBA1C in the last 72 hours. CBG:  Recent Labs Lab 08/20/15 2155  GLUCAP 108*   Lipid Profile: No results for input(s): CHOL, HDL, LDLCALC, TRIG, CHOLHDL, LDLDIRECT in the last 72 hours. Thyroid Function Tests: No results for input(s): TSH, T4TOTAL, FREET4, T3FREE, THYROIDAB in the last 72 hours. Anemia Panel: No results for input(s): VITAMINB12, FOLATE, FERRITIN, TIBC, IRON, RETICCTPCT in the last 72 hours. Urine analysis:    Component Value Date/Time   COLORURINE YELLOW 08/20/2015 2348   APPEARANCEUR CLOUDY (A) 08/20/2015 2348   LABSPEC >1.046 (H) 08/20/2015 2348   PHURINE 5.5 08/20/2015 2348   GLUCOSEU NEGATIVE 08/20/2015 2348   GLUCOSEU NEGATIVE 10/16/2009 0756   HGBUR LARGE (A) 08/20/2015 2348   HGBUR large 03/21/2009 1424   BILIRUBINUR NEGATIVE 08/20/2015 2348   BILIRUBINUR n 02/18/2013 0858    KETONESUR NEGATIVE 08/20/2015 2348   PROTEINUR NEGATIVE 08/20/2015 2348   UROBILINOGEN 0.2 06/28/2014 1700   NITRITE NEGATIVE 08/20/2015 2348   LEUKOCYTESUR SMALL (A) 08/20/2015 2348   Sepsis Labs: @LABRCNTIP (procalcitonin:4,lacticidven:4) )No results found for this or any previous visit (from the past 240 hour(s)).   Radiological Exams on Admission: Ct Head Wo Contrast  Result Date: 08/20/2015 CLINICAL DATA:  Acute onset of amnesia and confusion. Altered mental  status. Initial encounter. EXAM: CT HEAD WITHOUT CONTRAST TECHNIQUE: Contiguous axial images were obtained from the base of the skull through the vertex without intravenous contrast. COMPARISON:  None. FINDINGS: There is no evidence of acute infarction, mass lesion, or intra- or extra-axial hemorrhage on CT. The posterior fossa, including the cerebellum, brainstem and fourth ventricle, is within normal limits. The third and lateral ventricles, and basal ganglia are unremarkable in appearance. The cerebral hemispheres are symmetric in appearance, with normal gray-white differentiation. No mass effect or midline shift is seen. There is no evidence of fracture; visualized osseous structures are unremarkable in appearance. The visualized portions of the orbits are within normal limits. The paranasal sinuses and mastoid air cells are well-aerated. No significant soft tissue abnormalities are seen. IMPRESSION: Unremarkable noncontrast CT of the head. Electronically Signed   By: Garald Balding M.D.   On: 08/20/2015 22:56   Ct Angio Chest/abd/pel For Dissection W And/or Wo Contrast  Result Date: 08/20/2015 CLINICAL DATA:  Confusion.  Chest pain and abdominal pain. EXAM: CT ANGIOGRAPHY CHEST, ABDOMEN AND PELVIS TECHNIQUE: Multidetector CT imaging through the chest, abdomen and pelvis was performed using the standard protocol during bolus administration of intravenous contrast. Multiplanar reconstructed images and MIPs were obtained and reviewed to  evaluate the vascular anatomy. CONTRAST:  100 mL Isovue 370 COMPARISON:  None. FINDINGS: CTA CHEST FINDINGS Noncontrast CT images of the chest obtained demonstrate mild coronary artery calcification. Small pericardial effusion or thickening. No evidence of intramural hematoma. Images obtained during the arterial phase after contrast administration demonstrate normal caliber thoracic aorta. No evidence of aortic aneurysm or dissection. Normal heart size. Central and proximal segmental pulmonary arteries are well opacified without evidence of significant pulmonary embolus. Great vessel origins are patent. Moderate-sized esophageal hiatal hernia. Esophagus is mostly decompressed. No significant lymphadenopathy in the chest. Heterogeneous enlargement of the thyroid gland with 2.2 cm diameter calcified nodule in the left thyroid and partially calcified substernal goiter measuring 3.1 cm diameter. Consider elective follow-up of thyroid ultrasound. Atelectasis in the lung bases. Patchy mosaic attenuation pattern throughout the lungs. This could be due to air trapping, edema, or alveolar infiltration. No pneumothorax. No pleural effusions. Review of the MIP images confirms the above findings. CTA ABDOMEN AND PELVIS FINDINGS Normal caliber abdominal aorta with a few scattered calcifications. No evidence of aortic aneurysm or dissection. The abdominal aorta, celiac axis, superior mesenteric artery, duplicated bilateral renal arteries, inferior mesenteric artery, and bilateral iliac, external iliac, internal iliac, and common femoral arteries are patent. Diffuse fatty infiltration of the liver. Surgical absence of the gallbladder. No bile duct dilatation. The spleen, pancreas, adrenal glands, kidneys, inferior vena cava, and retroperitoneal lymph nodes are unremarkable. Stomach, small bowel, and colon are not abnormally distended. No free air or free fluid in the abdomen. Pelvis: Scattered diverticula in the colon. No evidence  of diverticulitis. Uterus is surgically absent. No abnormal adnexal mass or lymphadenopathy. Appendix is normal. Bladder wall is not thickened. Mild degenerative changes in the spine. No destructive bone lesions. Review of the MIP images confirms the above findings. IMPRESSION: No evidence of aneurysm or dissection in the thoracic or abdominal aorta. No evidence of significant pulmonary embolus. Incidental note of diffusely enlarged and partially calcified thyroid gland with multiple nodules, likely due to goiter. Mosaic attenuation pattern lungs may be due to air trapping, edema, or infiltration. Diffuse fatty infiltration of the liver. Electronically Signed   By: Lucienne Capers M.D.   On: 08/20/2015 23:07     EKG:  Independently reviewed. Sinus rhythm, QTC 429, nonspecific T-wave change.  Assessment/Plan Principal Problem:   Transient global amnesia Active Problems:   Migraine without aura   Mitral regurgitation   GERD   Atrial fibrillation, new onset (HCC)   Essential hypertension   Acute encephalopathy   Left flank pain   Transient global amnesia and acute encephalopathy:  Etiology is not clear. CT head is negative for acute intracranial abnormalities. neurogy, Dr. Nicole Kindred was consulted.  -will place on tele bed for obs -Appreciate Dr. Les Pou consultation, follow-up recommendations as follows  1. MRI of the brain without contrast  2. EEG, routine adult study  3. Continue aspirin daily for antiplatelet therapy  4. 2-D echocardiogram  5.No further neurological intervention is indicated if the above studies are unremarkable -check A1c and FLP  Left flank pain: May be due to recurrent kidney stone given hematuria. CT angiogram of abdomen/pelvis/chest did not show stone. Urinalysis has small amount of leukocyte, patient is asymptomatic for UTI, will hold off antibiotics now. -will get CT-per renal stone protocol -check lipase -f/u urine culture -When necessary Percocet for  pain  HTN: bp 168/95 -Continue lisinopril, Nadolol -hold HCTZ when pt is on NPO  GERD: -Protonix  Hx of Atrial Fibrillation:  CHA2DS2-VASc Score is 3, needs oral anticoagulation, but pt is not on AC at home for unknown reason. Heart rate is well controlled. Pt is taking ASA at home. Currently sinus rhythm. -May need to discuss with Card about possible anticoagulant treatment. -Telemetry monitor  Migraine without aura: -continue prn rizatriptan    DVT ppx: SQ Lovenox Code Status: Full code Family Communication: Yes, patient's daughter at bed side Disposition Plan:  Anticipate discharge back to previous home environment Consults called:  Neurology, Dr. Nicole Kindred Admission status: Obs / tele        Date of Service 08/21/2015    Ivor Costa [facility] Pager [phone]  If 7PM-7AM, please contact night-coverage [email or web address] Password [email or web address] 08/21/2015, 5:09 AM

## 2015-08-20 NOTE — ED Notes (Signed)
Patient transported to CT 

## 2015-08-20 NOTE — ED Triage Notes (Signed)
Pt c/o L flank/side pain onset today @1800 , pt states she has had 8-9 kidney stones in the past and noted hematuria and urgency that began @1700  today. Pt dry heaving and unable to sit still in triage.

## 2015-08-20 NOTE — ED Notes (Signed)
Dr. Issacs at bedside 

## 2015-08-21 ENCOUNTER — Ambulatory Visit (HOSPITAL_COMMUNITY): Payer: PPO

## 2015-08-21 ENCOUNTER — Observation Stay (HOSPITAL_COMMUNITY): Payer: PPO

## 2015-08-21 DIAGNOSIS — G43009 Migraine without aura, not intractable, without status migrainosus: Secondary | ICD-10-CM

## 2015-08-21 DIAGNOSIS — I480 Paroxysmal atrial fibrillation: Secondary | ICD-10-CM

## 2015-08-21 DIAGNOSIS — R109 Unspecified abdominal pain: Secondary | ICD-10-CM | POA: Diagnosis present

## 2015-08-21 DIAGNOSIS — G9340 Encephalopathy, unspecified: Secondary | ICD-10-CM | POA: Diagnosis not present

## 2015-08-21 DIAGNOSIS — I10 Essential (primary) hypertension: Secondary | ICD-10-CM | POA: Diagnosis not present

## 2015-08-21 DIAGNOSIS — G454 Transient global amnesia: Secondary | ICD-10-CM | POA: Diagnosis present

## 2015-08-21 DIAGNOSIS — R3129 Other microscopic hematuria: Secondary | ICD-10-CM | POA: Diagnosis not present

## 2015-08-21 LAB — BASIC METABOLIC PANEL
ANION GAP: 8 (ref 5–15)
BUN: 10 mg/dL (ref 6–20)
CALCIUM: 9.1 mg/dL (ref 8.9–10.3)
CHLORIDE: 108 mmol/L (ref 101–111)
CO2: 24 mmol/L (ref 22–32)
Creatinine, Ser: 0.83 mg/dL (ref 0.44–1.00)
GFR calc non Af Amer: >60 mL/min (ref >60)
Glucose, Bld: 80 mg/dL (ref 65–99)
Potassium: 3.5 mmol/L (ref 3.5–5.1)
Sodium: 140 mmol/L (ref 135–145)

## 2015-08-21 LAB — GLUCOSE, CAPILLARY: Glucose-Capillary: 82 mg/dL (ref 65–99)

## 2015-08-21 LAB — URINALYSIS, ROUTINE W REFLEX MICROSCOPIC
BILIRUBIN URINE: NEGATIVE
GLUCOSE, UA: NEGATIVE mg/dL
KETONES UR: NEGATIVE mg/dL
Nitrite: NEGATIVE
PH: 5.5 (ref 5.0–8.0)
Protein, ur: NEGATIVE mg/dL
Specific Gravity, Urine: >1.046 — ABNORMAL HIGH (ref 1.005–1.030)

## 2015-08-21 LAB — LIPID PANEL
CHOL/HDL RATIO: 3.8 ratio
CHOLESTEROL: 176 mg/dL (ref 0–200)
HDL: 46 mg/dL (ref >40)
LDL Cholesterol: 95 mg/dL (ref 0–99)
Triglycerides: 175 mg/dL — ABNORMAL HIGH (ref <150)
VLDL: 35 mg/dL (ref 0–40)

## 2015-08-21 LAB — LIPASE, BLOOD: LIPASE: 25 U/L (ref 11–51)

## 2015-08-21 LAB — BRAIN NATRIURETIC PEPTIDE: B Natriuretic Peptide: 42.2 pg/mL (ref 0.0–100.0)

## 2015-08-21 LAB — URINE MICROSCOPIC-ADD ON

## 2015-08-21 LAB — PROTIME-INR
INR: 0.99
Prothrombin Time: 13.1 seconds (ref 11.4–15.2)

## 2015-08-21 MED ORDER — SODIUM CHLORIDE 0.9 % IV SOLN
INTRAVENOUS | Status: DC
  Administered 2015-08-21: 01:00:00 via INTRAVENOUS

## 2015-08-21 MED ORDER — HYDROCHLOROTHIAZIDE 25 MG PO TABS: 25.0000 mg | ORAL_TABLET | Freq: Every day | ORAL | Status: DC

## 2015-08-21 MED ORDER — PANTOPRAZOLE SODIUM 40 MG PO TBEC
40.0000 mg | DELAYED_RELEASE_TABLET | Freq: Every day | ORAL | Status: DC
  Administered 2015-08-21: 40 mg via ORAL
  Filled 2015-08-21: qty 1

## 2015-08-21 MED ORDER — OXYCODONE-ACETAMINOPHEN 5-325 MG PO TABS
1.0000 | ORAL_TABLET | ORAL | Status: DC | PRN
  Administered 2015-08-21 (×2): 1 via ORAL
  Filled 2015-08-21 (×2): qty 1

## 2015-08-21 MED ORDER — RIZATRIPTAN BENZOATE 5 MG PO TBDP
5.0000 mg | ORAL_TABLET | ORAL | Status: DC | PRN
  Filled 2015-08-21: qty 1

## 2015-08-21 MED ORDER — STROKE: EARLY STAGES OF RECOVERY BOOK
Freq: Once | Status: AC
  Administered 2015-08-21: 1
  Filled 2015-08-21: qty 1

## 2015-08-21 MED ORDER — IBUPROFEN 200 MG PO TABS: 400.0000 mg | ORAL_TABLET | Freq: Every day | ORAL | Status: DC | PRN

## 2015-08-21 MED ORDER — ENOXAPARIN SODIUM 40 MG/0.4ML ~~LOC~~ SOLN
40.0000 mg | Freq: Every day | SUBCUTANEOUS | Status: DC
  Administered 2015-08-21: 40 mg via SUBCUTANEOUS
  Filled 2015-08-21: qty 0.4

## 2015-08-21 MED ORDER — SENNOSIDES-DOCUSATE SODIUM 8.6-50 MG PO TABS
1.0000 | ORAL_TABLET | Freq: Every evening | ORAL | Status: DC | PRN
Start: 2015-08-21 — End: 2015-12-06

## 2015-08-21 MED ORDER — NADOLOL 40 MG PO TABS
40.0000 mg | ORAL_TABLET | Freq: Two times a day (BID) | ORAL | Status: DC
  Administered 2015-08-21 (×2): 40 mg via ORAL
  Filled 2015-08-21 (×3): qty 1

## 2015-08-21 MED ORDER — ASPIRIN 325 MG PO TABS: 325.0000 mg | ORAL_TABLET | Freq: Every day | ORAL | Status: DC | PRN

## 2015-08-21 MED ORDER — LORAZEPAM 2 MG/ML IJ SOLN
0.5000 mg | Freq: Once | INTRAMUSCULAR | Status: AC
  Administered 2015-08-21: 0.5 mg via INTRAVENOUS
  Filled 2015-08-21: qty 1

## 2015-08-21 MED ORDER — OXYCODONE-ACETAMINOPHEN 5-325 MG PO TABS: 1.0000 | ORAL_TABLET | ORAL | 0 refills | Status: DC | PRN

## 2015-08-21 MED ORDER — SENNOSIDES-DOCUSATE SODIUM 8.6-50 MG PO TABS: 1.0000 | ORAL_TABLET | Freq: Every evening | ORAL | Status: DC | PRN

## 2015-08-21 MED ORDER — RISAQUAD PO CAPS
1.0000 | ORAL_CAPSULE | Freq: Every day | ORAL | Status: DC
  Administered 2015-08-21: 1 via ORAL
  Filled 2015-08-21: qty 1

## 2015-08-21 MED ORDER — LISINOPRIL 5 MG PO TABS
5.0000 mg | ORAL_TABLET | Freq: Every day | ORAL | Status: DC
  Administered 2015-08-21: 5 mg via ORAL
  Filled 2015-08-21: qty 1

## 2015-08-21 MED ORDER — SUCRALFATE 1 G PO TABS
1.0000 g | ORAL_TABLET | Freq: Two times a day (BID) | ORAL | Status: DC
  Filled 2015-08-21: qty 1

## 2015-08-21 MED ORDER — FLUTICASONE PROPIONATE 50 MCG/ACT NA SUSP
1.0000 | Freq: Every day | NASAL | Status: DC | PRN
  Filled 2015-08-21: qty 16

## 2015-08-21 MED ORDER — ALBUTEROL SULFATE (2.5 MG/3ML) 0.083% IN NEBU: 2.5000 mg | INHALATION_SOLUTION | Freq: Four times a day (QID) | RESPIRATORY_TRACT | Status: DC | PRN

## 2015-08-21 MED ORDER — LORATADINE 10 MG PO TABS
10.0000 mg | ORAL_TABLET | Freq: Every day | ORAL | Status: DC | PRN
  Administered 2015-08-21: 10 mg via ORAL
  Filled 2015-08-21: qty 1

## 2015-08-21 NOTE — Progress Notes (Signed)
EEG completed; results pending.    

## 2015-08-21 NOTE — Progress Notes (Addendum)
Pt arrived from [facility] ED around 0230 alert and  X 4 no pain, neurologically intact with exception of some recent memory loss. Will continue to monitor; additionally, patient claims to have allergy to Ibprophen.

## 2015-08-21 NOTE — ED Notes (Signed)
Report given to [name] from [facility] 

## 2015-08-21 NOTE — Procedures (Signed)
HPI:  65 y/o with MS change  TECHNICAL SUMMARY:  A multichannel referential and bipolar montage EEG using the standard international 10-20 system was performed on the patient described as awake, drowsy and asleep.  During the brief portion of the tracing in which the patient was most awake, there is a 10-11 Hz occipital dominant rhythm.  Most of the background consists of a low-voltage beta activity.   ACTIVATION:  Stepwise photic stimulation at 4-20 flashes per second was performed and did produce a symmetric driving response.  Hyperventilation was not performed.  EPILEPTIFORM ACTIVITY:  There were no spikes, sharp waves or paroxysmal activity.  SLEEP:  Both stage I and stage II sleep architecture were identified.  CARDIAC:  The EKG lead revealed a regular sinus rhythm.  IMPRESSION:  This is a normal EEG for the patients stated age.  There were no focal, hemispheric or lateralizing features.  No epileptiform activity was recorded.  A normal EEG does not exclude the diagnosis of a seizure disorder and if seizure remains high on the list of differential diagnosis, an ambulatory EEG may be of value.  Clinical correlation is required.

## 2015-08-21 NOTE — Progress Notes (Addendum)
Patient seen by Dr. Nicole Kindred from overnight. Now back to baseline. Isolated memory problems without other deficit for several hours. Description sounds very much like transient global amnesia.   I do not have any reason to suspect other etiology at this time. No need for full stroke workup.  I do not feel that this current event is likely to be TIA, however, given the characteristic description of TGA.   If EEG is negative, then no further recommendations from neurology at this time.   Roland Rack, MD [facility] [phone]  If 7pm- 7am, please page neurology on call as listed in [place].

## 2015-08-21 NOTE — Evaluation (Signed)
Physical Therapy Evaluation Patient Details Name: Anna Marquez MRN: [id number] DOB: [birth year] Today's Date: 08/21/2015   History of Present Illness  [name] a 65 y.o.femalewith medical history significant of atrial fibrillation not on anticoagulants, hypertension, GERD, kidney stone, mitral valve prolapse who presents with altered mental status, left flank pain.  Clinical Impression  Pt admitted with above. Pt with amnesia regarding the event. Pt functioning near baseline with mild balance deficits. Pt progressing well and demo'd excellent rehab potential. encourged pt to walk in the hallway as able with RN staff, never by herself.    Follow Up Recommendations No PT follow up;Supervision/Assistance - 24 hour (initially for first couple of days)    Equipment Recommendations  None recommended by PT    Recommendations for Other Services       Precautions / Restrictions Precautions Precautions: Fall Restrictions Weight Bearing Restrictions: No      Mobility  Bed Mobility               General bed mobility comments: pt received sitting up in chair  Transfers Overall transfer level: Modified independent Equipment used: None             General transfer comment: pushed up from arm rest  Ambulation/Gait Ambulation/Gait assistance: Supervision Ambulation Distance (Feet): 300 Feet Assistive device: None Gait Pattern/deviations: WFL(Within Functional Limits) Gait velocity: decreased/guarded Gait velocity interpretation: Below normal speed for age/gender General Gait Details: pt mildly unsteady but no overt LOB. Pt given pertebations in all directions from min-maxA and was able to maintain balance without assist  Stairs Stairs: Yes Stairs assistance: Min guard Stair Management: One rail Right;Alternating pattern;Backwards Number of Stairs: 10 General stair comments: pt used R hand rail despite no having hand rail at home, asked pt to 3 steps without  handrail tomimic home entry, pt cautious and guarded with increased time  Wheelchair Mobility    Modified Rankin (Stroke Patients Only)       Balance                                 Standardized Balance Assessment Standardized Balance Assessment : Dynamic Gait Index   Dynamic Gait Index Level Surface: Normal Change in Gait Speed: Normal Gait with Horizontal Head Turns: Mild Impairment Gait with Vertical Head Turns: Normal Gait and Pivot Turn: Normal Step Over Obstacle: Normal Step Around Obstacles: Normal Steps: Mild Impairment Total Score: 22       Pertinent Vitals/Pain Pain Assessment: 0-10 Pain Score: 3  Pain Location: headache Pain Descriptors / Indicators: Aching    Home Living Family/patient expects to be discharged to:: Private residence Living Arrangements: Alone Available Help at Discharge: Family;Available 24 hours/day Type of Home: House Home Access: Stairs to enter Entrance Stairs-Rails: None (4 steps in garage have a rail) Entrance Stairs-Number of Steps: 3 Home Layout: One level Home Equipment: Speedway - 2 wheels;Cane - quad;Bedside commode;Wheelchair - Education officer, community - power      Prior Function Level of Independence: Independent         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     Hand Dominance   Dominant Hand: Right    Extremity/Trunk Assessment   Upper Extremity Assessment: Overall WFL for tasks assessed           Lower Extremity Assessment: Overall WFL for tasks assessed      Cervical / Trunk Assessment: Normal  Communication   Communication: No difficulties  Cognition Arousal/Alertness:  Awake/alert Behavior During Therapy: WFL for tasks assessed/performed Overall Cognitive Status: Within Functional Limits for tasks assessed                      General Comments General comments (skin integrity, edema, etc.): educated pt on FAST (stroke education)    Exercises        Assessment/Plan    PT  Assessment Patient needs continued PT services  PT Diagnosis Difficulty walking;Abnormality of gait;Generalized weakness   PT Problem List Decreased activity tolerance;Decreased balance;Decreased mobility  PT Treatment Interventions DME instruction;Gait training;Stair training;Therapeutic exercise;Balance training;Neuromuscular re-education;Cognitive remediation;Therapeutic activities;Functional mobility training;Patient/family education   PT Goals (Current goals can be found in the Care Plan section) Acute Rehab PT Goals Patient Stated Goal: home PT Goal Formulation: With patient Time For Goal Achievement: 08/28/15 Potential to Achieve Goals: Good Additional Goals Additional Goal #1: Pt to score >49 on Berg Balance Test to inidicate minimal falls risk.    Frequency Min 2X/week   Barriers to discharge        Co-evaluation               End of Session Equipment Utilized During Treatment: Gait belt Activity Tolerance: Patient tolerated treatment well Patient left: in chair;with call bell/phone within reach;with chair alarm set;with family/visitor present Nurse Communication: Mobility status    Functional Assessment Tool Used: clinical judgement Functional Limitation: Mobility: Walking and moving around Mobility: Walking and Moving Around Current Status [id number]): At least 1 percent but less than 20 percent impaired, limited or restricted Mobility: Walking and Moving Around Goal Status [phone]): At least 1 percent but less than 20 percent impaired, limited or restricted    Time: 0749-0810 PT Time Calculation (min) (ACUTE ONLY): 21 min   Charges:   PT Evaluation $PT Eval Low Complexity: 1 Procedure     PT G Codes:   PT G-Codes **NOT FOR INPATIENT CLASS** Functional Assessment Tool Used: clinical judgement Functional Limitation: Mobility: Walking and moving around Mobility: Walking and Moving Around Current Status [id number]): At least 1 percent but less than 20 percent  impaired, limited or restricted Mobility: Walking and Moving Around Goal Status [phone]): At least 1 percent but less than 20 percent impaired, limited or restricted    Kingsley Callander 08/21/2015, 9:04 AM   Kittie Plater, PT, DPT Pager #: [phone] Office #: [phone]

## 2015-08-21 NOTE — ED Notes (Signed)
Report called to [facility] °

## 2015-08-21 NOTE — Progress Notes (Signed)
OT Discharge Note  Patient Details Name: DEAMBER WANZER MRN: [id number] DOB: 03-20-1950   Cancelled Treatment:    Reason Eval/Treat Not Completed: OT screened, no needs identified, will sign off (after speaking with PT Ashly)  Vonita Moss   OTR/L Pager: [phone] Office: [phone] .  08/21/2015, 9:12 AM

## 2015-08-21 NOTE — Progress Notes (Signed)
Discharge instructions reviewed with patient/family. All questions answered at this time. RX given. Transport home per family.   Ave Filter, RN

## 2015-08-21 NOTE — Consult Note (Addendum)
Admission H&P    Chief Complaint: Transient memory difficulty with confusion.  HPI: Anna Marquez is an 65 y.o. female with a history of hypertension, nephrolithiasis, mitral valve prolapse, diverticulosis and osteoarthritis who went to the ED at [facility] for acute flank pain and hematuria with likely recurrent kidney stones. Within 30 minutes of arrival she became confused and disoriented. She Asking same questions over and over and was unable to remember on a short-term basis. She had no focal deficits. CT scan of her head showed no acute intracranial abnormality. Memory difficulty markedly improved after a few hours and has essentially resolved at this point. She has no previous history of memory abnormalities. She was noted to have atrial fibrillation which was previously undocumented. There is no history of stroke nor TIA. She does have a history of migraine headaches, however.  Past Medical History:  Diagnosis Date  . Adenomatous colon polyp 2003  . Cholelithiasis   . daughter had blood clots in lungs from anesthesiae   . Diverticulosis of colon   . Gastric polyps   . GERD (gastroesophageal reflux disease)   . Helicobacter pylori (H. pylori)   . Hiatal hernia   . Hypertension   . Mitral valve prolapse   . Nephrolithiasis   . Osteoarthritis   . Osteopenia 04/28/2014   T score -1.5 FRAX 8.2%/0.7%    Past Surgical History:  Procedure Laterality Date  . ANTERIOR AND POSTERIOR REPAIR N/A 06/14/2014   Procedure: ANTERIOR (CYSTOCELE) AND POSTERIOR REPAIR (RECTOCELE);  Surgeon: Anastasio Auerbach, MD;  Location: [facility];  Service: Gynecology;  Laterality: N/A;  . CHOLECYSTECTOMY    . kidney stone removal     x2  . VAGINAL HYSTERECTOMY N/A 06/14/2014   Procedure: HYSTERECTOMY VAGINAL;  Surgeon: Anastasio Auerbach, MD;  Location: [facility];  Service: Gynecology;  Laterality: N/A;    Family History  Problem Relation Age of Onset  . Cancer Father     prostate  . Breast cancer  Paternal Aunt 31  . Diabetes Maternal Grandmother   . Heart disease Maternal Grandmother   . Heart disease Maternal Grandfather   . Melanoma Paternal Aunt    Social History:  reports that she has never smoked. She has never used smokeless tobacco. She reports that she drinks alcohol. She reports that she does not use drugs.  Allergies:  Allergies  Allergen Reactions  . Penicillins Hives and Shortness Of Breath    Has patient had a PCN reaction causing immediate rash, facial/tongue/throat swelling, SOB or lightheadedness with hypotension: yes Has patient had a PCN reaction causing severe rash involving mucus membranes or skin necrosis: no Has patient had a PCN reaction that required hospitalization: unknown Has patient had a PCN reaction occurring within the last 10 years: no If all of the above answers are "NO", then may proceed with Cephalosporin use.   . Erythromycin Nausea And Vomiting    REACTION: Stomach upset    Medications Prior to Admission  Medication Sig Dispense Refill  . albuterol (PROVENTIL HFA;VENTOLIN HFA) 108 (90 BASE) MCG/ACT inhaler Inhale 2 puffs into the lungs every 6 (six) hours as needed. For shortness of breath or wheezing 1 Inhaler 0  . aspirin 325 MG tablet Take 325 mg by mouth daily as needed (afib, chest pains).     . fluticasone (FLONASE) 50 MCG/ACT nasal spray USE ONE SPRAY IN EACH NOSTRIL DAILY. (Patient taking differently: Place 1 spray into both nostrils daily as needed for allergies. USE ONE SPRAY  IN EACH NOSTRIL DAILY.) 32 g 6  . hydrochlorothiazide (HYDRODIURIL) 25 MG tablet Take 1 tablet (25 mg total) by mouth daily as needed. For fluid retention (Patient taking differently: Take 25 mg by mouth daily. For fluid retention) 100 tablet 3  . ibuprofen (ADVIL,MOTRIN) 200 MG tablet Take 400 mg by mouth daily as needed for mild pain.    [name] lisinopril (PRINIVIL,ZESTRIL) 5 MG tablet TAKE 1 TABLET BY MOUTH EVERY DAY (Patient taking differently: TAKE 58m TABLET BY  MOUTH EVERY DAY) 90 tablet 1  . loratadine (CLARITIN) 10 MG tablet Take 10 mg by mouth daily as needed for allergies. For allergies    . nadolol (CORGARD) 40 MG tablet Take 40 mg by mouth 2 (two) times daily.  1  . oxyCODONE-acetaminophen (PERCOCET/ROXICET) 5-325 MG per tablet Take 1-2 tablets by mouth every 4 (four) hours as needed (moderate to severe pain (when tolerating fluids)). 30 tablet 0  . pantoprazole (PROTONIX) 40 MG tablet TAKE 1 TABLET BY MOUTH EVERY DAY (Patient taking differently: TAKE 40 mg TABLET BY MOUTH EVERY DAY) 90 tablet 1  . Probiotic Product (PROBIOTIC PO) Take 1 tablet by mouth daily.    . rizatriptan (MAXALT-MLT) 5 MG disintegrating tablet Take 1 tablet (5 mg total) by mouth as needed for migraine. May repeat in 2 hours if needed 10 tablet 0  . sucralfate (CARAFATE) 1 G tablet TAKE ONE TABLET BY MOUTH TWICE DAILY (Patient taking differently: TAKE 1 g TABLET BY MOUTH  DAILY) 200 tablet 1  . nadolol (CORGARD) 20 MG tablet Take 2 tablets (437m twice daily (Patient not taking: Reported on 08/20/2015) 200 tablet 3    ROS: History obtained from the patient  General ROS: negative for - chills, fatigue, fever, night sweats, weight gain or weight loss Psychological ROS: negative for - behavioral disorder, hallucinations, memory difficulties, mood swings or suicidal ideation Ophthalmic ROS: negative for - blurry vision, double vision, eye pain or loss of vision ENT ROS: negative for - epistaxis, nasal discharge, oral lesions, sore throat, tinnitus or vertigo Allergy and Immunology ROS: negative for - hives or itchy/watery eyes Hematological and Lymphatic ROS: negative for - bleeding problems, bruising or swollen lymph nodes Endocrine ROS: negative for - galactorrhea, hair pattern changes, polydipsia/polyuria or temperature intolerance Respiratory ROS: negative for - cough, hemoptysis, shortness of breath or wheezing Cardiovascular ROS: negative for - chest pain, dyspnea on  exertion, edema or irregular heartbeat Gastrointestinal ROS: negative for - abdominal pain, diarrhea, hematemesis, nausea/vomiting or stool incontinence Genito-Urinary ROS: Nephrolithiasis as noted in present illness Musculoskeletal ROS: negative for - joint swelling or muscular weakness Neurological ROS: as noted in HPI Dermatological ROS: negative for rash and skin lesion changes  Physical Examination: Blood pressure 142/84, pulse 71, temperature 97.8 F (36.6 C), temperature source Oral, resp. rate 21, height 5' 4"  (1.626 m), weight 73.7 kg (162 lb 8 oz), SpO2 91 %.  HEENT-  Normocephalic, no lesions, without obvious abnormality.  Normal external eye and conjunctiva.  Normal TM's bilaterally.  Normal auditory canals and external ears. Normal external nose, mucus membranes and septum.  Normal pharynx. Neck supple with no masses, nodes, nodules or enlargement. Cardiovascular - regular rate and rhythm, S1, S2 normal, no murmur, click, rub or gallop Lungs - chest clear, no wheezing, rales, normal symmetric air entry Abdomen - soft, non-tender; bowel sounds normal; no masses,  no organomegaly Extremities - no joint deformities, effusion, or inflammation and no edema  Neurologic Examination: Mental Status: Alert, oriented, thought content appropriate.  No short-term memory difficulty at this point. Speech fluent without evidence of aphasia. Able to follow commands without difficulty. Cranial Nerves: II-Visual fields were normal. III/IV/VI-Pupils were equal and reacted normally to light. Extraocular movements were full and conjugate.    V/VII-no facial numbness and no facial weakness. VIII-normal. X-normal speech. XI: trapezius strength/neck flexion strength normal bilaterally XII-midline tongue extension with normal strength. Motor: 5/5 bilaterally with normal tone and bulk Sensory: Normal throughout. Deep Tendon Reflexes: 2+ and symmetric. Plantars: Flexor bilaterally Cerebellar:  Normal finger-to-nose testing. Carotid auscultation: Normal  Results for orders placed or performed during the hospital encounter of 08/20/15 (from the past 48 hour(s))  POC CBG, ED     Status: Abnormal   Collection Time: 08/20/15  9:55 PM  Result Value Ref Range   Glucose-Capillary 108 (H) 65 - 99 mg/dL  CBC with Differential     Status: Abnormal   Collection Time: 08/20/15 10:04 PM  Result Value Ref Range   WBC 9.0 4.0 - 10.5 K/uL   RBC 4.43 3.87 - 5.11 MIL/uL   Hemoglobin 13.6 12.0 - 15.0 g/dL   HCT 37.5 36.0 - 46.0 %   MCV 84.7 78.0 - 100.0 fL   MCH 30.7 26.0 - 34.0 pg   MCHC 36.3 (H) 30.0 - 36.0 g/dL   RDW 12.8 11.5 - 15.5 %   Platelets 159 150 - 400 K/uL   Neutrophils Relative % 74 %   Neutro Abs 6.6 1.7 - 7.7 K/uL   Lymphocytes Relative 19 %   Lymphs Abs 1.7 0.7 - 4.0 K/uL   Monocytes Relative 6 %   Monocytes Absolute 0.5 0.1 - 1.0 K/uL   Eosinophils Relative 1 %   Eosinophils Absolute 0.1 0.0 - 0.7 K/uL   Basophils Relative 0 %   Basophils Absolute 0.0 0.0 - 0.1 K/uL  Comprehensive metabolic panel     Status: Abnormal   Collection Time: 08/20/15 10:04 PM  Result Value Ref Range   Sodium 138 135 - 145 mmol/L   Potassium 3.5 3.5 - 5.1 mmol/L   Chloride 107 101 - 111 mmol/L   CO2 21 (L) 22 - 32 mmol/L   Glucose, Bld 106 (H) 65 - 99 mg/dL   BUN 15 6 - 20 mg/dL   Creatinine, Ser 0.85 0.44 - 1.00 mg/dL   Calcium 9.5 8.9 - 10.3 mg/dL   Total Protein 8.1 6.5 - 8.1 g/dL   Albumin 4.6 3.5 - 5.0 g/dL   AST 19 15 - 41 U/L   ALT 21 14 - 54 U/L   Alkaline Phosphatase 55 38 - 126 U/L   Total Bilirubin 2.4 (H) 0.3 - 1.2 mg/dL   GFR calc non Af Amer >60 >60 mL/min   GFR calc Af Amer >60 >60 mL/min    Comment: (NOTE) The eGFR has been calculated using the CKD EPI equation. This calculation has not been validated in all clinical situations. eGFR's persistently <60 mL/min signify possible Chronic Kidney Disease.    Anion gap 10 5 - 15  Lipase, blood     Status: None    Collection Time: 08/20/15 10:04 PM  Result Value Ref Range   Lipase 25 11 - 51 U/L  Brain natriuretic peptide     Status: None   Collection Time: 08/20/15 10:04 PM  Result Value Ref Range   B Natriuretic Peptide 42.2 0.0 - 100.0 pg/mL  Protime-INR     Status: None   Collection Time: 08/20/15 10:04 PM  Result Value Ref Range  Prothrombin Time 13.1 11.4 - 15.2 seconds   INR 0.99   I-Stat Troponin, ED (not at [facility])     Status: None   Collection Time: 08/20/15 10:11 PM  Result Value Ref Range   Troponin i, poc 0.00 0.00 - 0.08 ng/mL   Comment 3            Comment: Due to the release kinetics of cTnI, a negative result within the first hours of the onset of symptoms does not rule out myocardial infarction with certainty. If myocardial infarction is still suspected, repeat the test at appropriate intervals.   Urinalysis, Routine w reflex microscopic (not at [facility])     Status: Abnormal   Collection Time: 08/20/15 11:48 PM  Result Value Ref Range   Color, Urine YELLOW YELLOW   APPearance CLOUDY (A) CLEAR   Specific Gravity, Urine >1.046 (H) 1.005 - 1.030   pH 5.5 5.0 - 8.0   Glucose, UA NEGATIVE NEGATIVE mg/dL   Hgb urine dipstick LARGE (A) NEGATIVE   Bilirubin Urine NEGATIVE NEGATIVE   Ketones, ur NEGATIVE NEGATIVE mg/dL   Protein, ur NEGATIVE NEGATIVE mg/dL   Nitrite NEGATIVE NEGATIVE   Leukocytes, UA SMALL (A) NEGATIVE  Urine microscopic-add on     Status: Abnormal   Collection Time: 08/20/15 11:48 PM  Result Value Ref Range   Squamous Epithelial / LPF 0-5 (A) NONE SEEN   WBC, UA 0-5 0 - 5 WBC/hpf   RBC / HPF TOO NUMEROUS TO COUNT 0 - 5 RBC/hpf   Bacteria, UA RARE (A) NONE SEEN   Ct Head Wo Contrast  Result Date: 08/20/2015 CLINICAL DATA:  Acute onset of amnesia and confusion. Altered mental status. Initial encounter. EXAM: CT HEAD WITHOUT CONTRAST TECHNIQUE: Contiguous axial images were obtained from the base of the skull through the vertex without intravenous contrast.  COMPARISON:  None. FINDINGS: There is no evidence of acute infarction, mass lesion, or intra- or extra-axial hemorrhage on CT. The posterior fossa, including the cerebellum, brainstem and fourth ventricle, is within normal limits. The third and lateral ventricles, and basal ganglia are unremarkable in appearance. The cerebral hemispheres are symmetric in appearance, with normal gray-white differentiation. No mass effect or midline shift is seen. There is no evidence of fracture; visualized osseous structures are unremarkable in appearance. The visualized portions of the orbits are within normal limits. The paranasal sinuses and mastoid air cells are well-aerated. No significant soft tissue abnormalities are seen. IMPRESSION: Unremarkable noncontrast CT of the head. Electronically Signed   By: Garald Balding M.D.   On: 08/20/2015 22:56   Ct Angio Chest/abd/pel For Dissection W And/or Wo Contrast  Result Date: 08/20/2015 CLINICAL DATA:  Confusion.  Chest pain and abdominal pain. EXAM: CT ANGIOGRAPHY CHEST, ABDOMEN AND PELVIS TECHNIQUE: Multidetector CT imaging through the chest, abdomen and pelvis was performed using the standard protocol during bolus administration of intravenous contrast. Multiplanar reconstructed images and MIPs were obtained and reviewed to evaluate the vascular anatomy. CONTRAST:  100 mL Isovue 370 COMPARISON:  None. FINDINGS: CTA CHEST FINDINGS Noncontrast CT images of the chest obtained demonstrate mild coronary artery calcification. Small pericardial effusion or thickening. No evidence of intramural hematoma. Images obtained during the arterial phase after contrast administration demonstrate normal caliber thoracic aorta. No evidence of aortic aneurysm or dissection. Normal heart size. Central and proximal segmental pulmonary arteries are well opacified without evidence of significant pulmonary embolus. Great vessel origins are patent. Moderate-sized esophageal hiatal hernia. Esophagus is  mostly decompressed. No significant lymphadenopathy  in the chest. Heterogeneous enlargement of the thyroid gland with 2.2 cm diameter calcified nodule in the left thyroid and partially calcified substernal goiter measuring 3.1 cm diameter. Consider elective follow-up of thyroid ultrasound. Atelectasis in the lung bases. Patchy mosaic attenuation pattern throughout the lungs. This could be due to air trapping, edema, or alveolar infiltration. No pneumothorax. No pleural effusions. Review of the MIP images confirms the above findings. CTA ABDOMEN AND PELVIS FINDINGS Normal caliber abdominal aorta with a few scattered calcifications. No evidence of aortic aneurysm or dissection. The abdominal aorta, celiac axis, superior mesenteric artery, duplicated bilateral renal arteries, inferior mesenteric artery, and bilateral iliac, external iliac, internal iliac, and common femoral arteries are patent. Diffuse fatty infiltration of the liver. Surgical absence of the gallbladder. No bile duct dilatation. The spleen, pancreas, adrenal glands, kidneys, inferior vena cava, and retroperitoneal lymph nodes are unremarkable. Stomach, small bowel, and colon are not abnormally distended. No free air or free fluid in the abdomen. Pelvis: Scattered diverticula in the colon. No evidence of diverticulitis. Uterus is surgically absent. No abnormal adnexal mass or lymphadenopathy. Appendix is normal. Bladder wall is not thickened. Mild degenerative changes in the spine. No destructive bone lesions. Review of the MIP images confirms the above findings. IMPRESSION: No evidence of aneurysm or dissection in the thoracic or abdominal aorta. No evidence of significant pulmonary embolus. Incidental note of diffusely enlarged and partially calcified thyroid gland with multiple nodules, likely due to goiter. Mosaic attenuation pattern lungs may be due to air trapping, edema, or infiltration. Diffuse fatty infiltration of the liver. Electronically  Signed   By: Lucienne Capers M.D.   On: 08/20/2015 23:07    Assessment/Plan 65 year old lady who likely experienced an episode of transient global amnesia. Memory difficulty has resolved at this point. Examination showed no focal deficits, in addition to normal mental status. She was noted to have atrial fibrillation which had not been previously documented. Significance is unclear.  Recommendations: 1. MRI of the brain without contrast 2. EEG, routine adult study 3. Continue aspirin daily for antiplatelet therapy 4. 2-D echocardiogram 5.No further neurological intervention is indicated if the above studies are unremarkable.  C.R. Nicole Kindred, MD [facility]  08/21/2015, 2:39 AM

## 2015-08-21 NOTE — ED Notes (Signed)
Attempted to call report to receiving floor with no answer x1.

## 2015-08-21 NOTE — ED Provider Notes (Signed)
[facility] DEPT Provider Note   CSN: [id number] Arrival date & time: 08/20/15  2021  First Provider Contact:  None       History   Chief Complaint Chief Complaint  Patient presents with  . Flank Pain    HPI Anna Marquez is a 65 y.o. female.  The history is provided by the patient, the spouse, a relative and medical records.    65 yo F with PMHx of HTN, HLD, MVP who presents initially with left flank pain. Per report from husband, pt had 1 day of mild, aching, left flank pain that feels similar to her prior stones. She subsequently presents for evaluation. However, on arrival, pt had acute onset of loss of memory. While in ED room, pt suddenly lost all memory of why she was in hospital, or where she was. On interview, pt states she cannot remember where she is but is able to recognize family members. She endorses headache, a sharp left sided CP, and ongoing left flank pain. No numbness or weakness. No speech difficulty.  Past Medical History:  Diagnosis Date  . Adenomatous colon polyp 2003  . Cholelithiasis   . daughter had blood clots in lungs from anesthesiae   . Diverticulosis of colon   . Gastric polyps   . GERD (gastroesophageal reflux disease)   . Helicobacter pylori (H. pylori)   . Hiatal hernia   . Hypertension   . Mitral valve prolapse   . Nephrolithiasis   . Osteoarthritis   . Osteopenia 04/28/2014   T score -1.5 FRAX 8.2%/0.7%    Patient Active Problem List   Diagnosis Date Noted  . Transient global amnesia 08/21/2015  . Acute encephalopathy 08/21/2015  . Left flank pain 08/21/2015  . Uterine prolapse 06/14/2014  . Cystocele with uterine prolapse 02/25/2013  . Acute bacterial bronchitis 01/23/2013  . Influenza with other respiratory manifestations 01/11/2013  . Abnormal results of thyroid function studies 02/18/2012  . Essential hypertension 01/27/2012  . Atrial fibrillation, new onset ([place]) 01/09/2012  . DYSPLASTIC NEVUS, CHEST 11/14/2009  .  HEMATURIA UNSPECIFIED 03/21/2009  . Migraine without aura 05/20/2008  . ALLERGIC RHINITIS 05/20/2008  . Mitral regurgitation 08/10/2007  . GERD 08/10/2007  . HIATAL HERNIA 08/10/2007  . DIVERTICULOSIS, COLON 08/10/2007  . OSTEOARTHRITIS 08/10/2007  . COLONIC POLYPS, ADENOMATOUS, HX OF 08/10/2007    Past Surgical History:  Procedure Laterality Date  . ANTERIOR AND POSTERIOR REPAIR N/A 06/14/2014   Procedure: ANTERIOR (CYSTOCELE) AND POSTERIOR REPAIR (RECTOCELE);  Surgeon: Anastasio Auerbach, MD;  Location: [facility];  Service: Gynecology;  Laterality: N/A;  . CHOLECYSTECTOMY    . kidney stone removal     x2  . VAGINAL HYSTERECTOMY N/A 06/14/2014   Procedure: HYSTERECTOMY VAGINAL;  Surgeon: Anastasio Auerbach, MD;  Location: [facility];  Service: Gynecology;  Laterality: N/A;    OB History    Gravida Para Term Preterm AB Living   2 2       2    SAB TAB Ectopic Multiple Live Births                   Home Medications    Prior to Admission medications   Medication Sig Start Date End Date Taking? Authorizing Provider  albuterol (PROVENTIL HFA;VENTOLIN HFA) 108 (90 BASE) MCG/ACT inhaler Inhale 2 puffs into the lungs every 6 (six) hours as needed. For shortness of breath or wheezing 12/15/14  Yes Lucretia Kern, DO  aspirin 325 MG tablet Take 325 mg by mouth  daily as needed (afib, chest pains).    Yes Historical Provider, MD  fluticasone (FLONASE) 50 MCG/ACT nasal spray USE ONE SPRAY IN EACH NOSTRIL DAILY. Patient taking differently: Place 1 spray into both nostrils daily as needed for allergies. USE ONE SPRAY IN EACH NOSTRIL DAILY. 02/25/13  Yes Dorena Cookey, MD  hydrochlorothiazide (HYDRODIURIL) 25 MG tablet Take 1 tablet (25 mg total) by mouth daily as needed. For fluid retention Patient taking differently: Take 25 mg by mouth daily. For fluid retention 05/02/14  Yes Dorena Cookey, MD  lisinopril (PRINIVIL,ZESTRIL) 5 MG tablet TAKE 1 TABLET BY MOUTH EVERY DAY Patient taking differently: TAKE  5mg  TABLET BY MOUTH EVERY DAY 06/01/15  Yes Dorena Cookey, MD  loratadine (CLARITIN) 10 MG tablet Take 10 mg by mouth daily as needed for allergies. For allergies   Yes Historical Provider, MD  nadolol (CORGARD) 40 MG tablet Take 40 mg by mouth 2 (two) times daily. 06/01/15  Yes Historical Provider, MD  oxyCODONE-acetaminophen (PERCOCET/ROXICET) 5-325 MG per tablet Take 1-2 tablets by mouth every 4 (four) hours as needed (moderate to severe pain (when tolerating fluids)). 06/15/14  Yes Anastasio Auerbach, MD  pantoprazole (PROTONIX) 40 MG tablet TAKE 1 TABLET BY MOUTH EVERY DAY Patient taking differently: TAKE 40 mg TABLET BY MOUTH EVERY DAY 06/01/15  Yes Dorena Cookey, MD  Probiotic Product (PROBIOTIC PO) Take 1 tablet by mouth daily.   Yes Historical Provider, MD  rizatriptan (MAXALT-MLT) 5 MG disintegrating tablet Take 1 tablet (5 mg total) by mouth as needed for migraine. May repeat in 2 hours if needed 02/25/13  Yes Dorena Cookey, MD  sucralfate (CARAFATE) 1 G tablet TAKE ONE TABLET BY MOUTH TWICE DAILY Patient taking differently: TAKE 1 g TABLET BY MOUTH  DAILY 08/03/14  Yes Dorena Cookey, MD  nadolol (CORGARD) 20 MG tablet Take 2 tablets (40mg ) twice daily Patient not taking: Reported on 08/20/2015 08/04/14   Dorena Cookey, MD    Family History Family History  Problem Relation Age of Onset  . Cancer Father     prostate  . Breast cancer Paternal Aunt 21  . Diabetes Maternal Grandmother   . Heart disease Maternal Grandmother   . Heart disease Maternal Grandfather   . Melanoma Paternal Aunt     Social History Social History  Substance Use Topics  . Smoking status: Never Smoker  . Smokeless tobacco: Never Used  . Alcohol use 0.0 oz/week     Comment: Occas     Allergies   Ibuprofen; Penicillins; and Erythromycin   Review of Systems Review of Systems  Constitutional: Negative for chills, fatigue and fever.  HENT: Negative for congestion and rhinorrhea.   Eyes: Negative for  visual disturbance.  Respiratory: Positive for chest tightness. Negative for cough, shortness of breath and wheezing.   Cardiovascular: Positive for chest pain. Negative for leg swelling.  Gastrointestinal: Negative for abdominal pain, diarrhea, nausea and vomiting.  Genitourinary: Positive for flank pain. Negative for dysuria.  Musculoskeletal: Negative for neck pain and neck stiffness.  Skin: Negative for rash and wound.  Allergic/Immunologic: Negative for immunocompromised state.  Neurological: Negative for syncope, weakness and headaches.  Psychiatric/Behavioral: Positive for confusion.     Physical Exam Updated Vital Signs BP (!) 140/100 (BP Location: Left Arm)   Pulse 72   Temp 98.4 F (36.9 C) (Oral)   Resp 18   Ht 5\' 4"  (1.626 m)   Wt 162 lb 8 oz (73.7 kg)  SpO2 98%   BMI 27.89 kg/m   Physical Exam  Constitutional: She appears well-developed and well-nourished. No distress.  HENT:  Head: Normocephalic and atraumatic.  Mouth/Throat: Oropharynx is clear and moist.  Eyes: Conjunctivae are normal. Pupils are equal, round, and reactive to light.  Neck: Neck supple.  Cardiovascular: Normal rate, regular rhythm and normal heart sounds.  Exam reveals no friction rub.   No murmur heard. Pulmonary/Chest: Effort normal and breath sounds normal. No respiratory distress. She has no wheezes. She has no rales.  Abdominal: She exhibits no distension. There is no tenderness.  Musculoskeletal: She exhibits no edema.  Neurological: She is alert. She exhibits normal muscle tone.  Skin: Skin is warm. Capillary refill takes less than 2 seconds.  Nursing note and vitals reviewed.   Neurological Exam:  Mental Status: Alert but oriented to person only. Amnestic to recent events with impaired memory. Repetitive questioning. Speech is clear and comprehension appears intact. Cranial Nerves: Visual fields intact to confrontation in all quadrants bilaterally. EOMI and PERRLA. No nystagmus  noted. Facial sensation intact at forehead, maxillary cheek, and chin/mandible bilaterally. No weakness of masticatory muscles. No facial asymmetry or weakness. Hearing grossly normal to finer rub. Uvula is midline, and palate elevates symmetrically. Normal SCM and trapezius strength. Tongue midline without fasciculations Motor: Muscle strength 5/5 in proximal and distal UE and LE bilaterally. No pronator drift. Muscle tone normal. Reflexes: 2+ and symmetrical in all four extremities.  Sensation: Intact to light touch in upper and lower extremities distally bilaterally.  Gait: Normal without ataxia. Coordination: Normal FTN bilaterally.      ED Treatments / Results  Labs (all labs ordered are listed, but only abnormal results are displayed) Labs Reviewed  CBC WITH DIFFERENTIAL/PLATELET - Abnormal; Notable for the following:       Result Value   MCHC 36.3 (*)    All other components within normal limits  COMPREHENSIVE METABOLIC PANEL - Abnormal; Notable for the following:    CO2 21 (*)    Glucose, Bld 106 (*)    Total Bilirubin 2.4 (*)    All other components within normal limits  URINALYSIS, ROUTINE W REFLEX MICROSCOPIC (NOT AT [facility]) - Abnormal; Notable for the following:    APPearance CLOUDY (*)    Specific Gravity, Urine >1.046 (*)    Hgb urine dipstick LARGE (*)    Leukocytes, UA SMALL (*)    All other components within normal limits  URINE MICROSCOPIC-ADD ON - Abnormal; Notable for the following:    Squamous Epithelial / LPF 0-5 (*)    Bacteria, UA RARE (*)    All other components within normal limits  LIPID PANEL - Abnormal; Notable for the following:    Triglycerides 175 (*)    All other components within normal limits  CBG MONITORING, ED - Abnormal; Notable for the following:    Glucose-Capillary 108 (*)    All other components within normal limits  URINE CULTURE  LIPASE, BLOOD  BRAIN NATRIURETIC PEPTIDE  PROTIME-INR  GLUCOSE, CAPILLARY  HEMOGLOBIN [id number]  BASIC  METABOLIC PANEL  I-STAT TROPOININ, ED    EKG  EKG Interpretation  Date/Time:  Sunday August 20 2015 21:56:19 EDT Ventricular Rate:  79 PR Interval:    QRS Duration: 89 QT Interval:  374 QTC Calculation: 429 R Axis:   22 Text Interpretation:  Sinus rhythm Baseline artifact Non-specific ST-t changes Confirmed by Ellender Hose MD, Lysbeth Galas [phone]) on 08/21/2015 3:14:17 AM       Radiology Ct Head Wo  Contrast  Result Date: 08/20/2015 CLINICAL DATA:  Acute onset of amnesia and confusion. Altered mental status. Initial encounter. EXAM: CT HEAD WITHOUT CONTRAST TECHNIQUE: Contiguous axial images were obtained from the base of the skull through the vertex without intravenous contrast. COMPARISON:  None. FINDINGS: There is no evidence of acute infarction, mass lesion, or intra- or extra-axial hemorrhage on CT. The posterior fossa, including the cerebellum, brainstem and fourth ventricle, is within normal limits. The third and lateral ventricles, and basal ganglia are unremarkable in appearance. The cerebral hemispheres are symmetric in appearance, with normal gray-white differentiation. No mass effect or midline shift is seen. There is no evidence of fracture; visualized osseous structures are unremarkable in appearance. The visualized portions of the orbits are within normal limits. The paranasal sinuses and mastoid air cells are well-aerated. No significant soft tissue abnormalities are seen. IMPRESSION: Unremarkable noncontrast CT of the head. Electronically Signed   By: Garald Balding M.D.   On: 08/20/2015 22:56   Mr Brain Wo Contrast  Result Date: 08/21/2015 CLINICAL DATA:  Initial evaluation for acute encephalopathy. EXAM: MRI HEAD WITHOUT CONTRAST TECHNIQUE: Multiplanar, multiecho pulse sequences of the brain and surrounding structures were obtained without intravenous contrast. COMPARISON:  Prior CT from 08/20/2015. FINDINGS: Age appropriate cerebral volume loss present. Minimal patchy T2/FLAIR  hyperintensity within the periventricular and deep white matter both cerebral hemispheres, nonspecific, but felt to be within normal limits for patient age. No abnormal foci of restricted diffusion to suggest acute ischemia. Gray-white matter differentiation maintained. Major intracranial vascular flow voids are preserved. No acute or chronic intracranial hemorrhage. No areas of chronic infarction. No mass lesion, midline shift, or mass effect. No hydrocephalus. No extra-axial fluid collection. Major dural sinuses are grossly patent. Craniocervical junction within normal limits. Scattered multilevel degenerative spondylolysis noted within the visualized upper cervical spine, most prevalent at C4-5. Resultant mild stenosis at this level. Pituitary gland within normal limits. No acute abnormality about the globes and orbits. Mild scattered mucosal thickening within the ethmoidal air cells and maxillary sinuses. Paranasal sinuses are otherwise clear. No mastoid effusion. Inner ear structures normal. Bone marrow signal intensity within normal limits. No scalp soft tissue abnormality. IMPRESSION: Normal brain MRI for patient age. No acute intracranial process identified. Electronically Signed   By: Jeannine Boga M.D.   On: 08/21/2015 05:47   Ct Angio Chest/abd/pel For Dissection W And/or Wo Contrast  Result Date: 08/20/2015 CLINICAL DATA:  Confusion.  Chest pain and abdominal pain. EXAM: CT ANGIOGRAPHY CHEST, ABDOMEN AND PELVIS TECHNIQUE: Multidetector CT imaging through the chest, abdomen and pelvis was performed using the standard protocol during bolus administration of intravenous contrast. Multiplanar reconstructed images and MIPs were obtained and reviewed to evaluate the vascular anatomy. CONTRAST:  100 mL Isovue 370 COMPARISON:  None. FINDINGS: CTA CHEST FINDINGS Noncontrast CT images of the chest obtained demonstrate mild coronary artery calcification. Small pericardial effusion or thickening. No  evidence of intramural hematoma. Images obtained during the arterial phase after contrast administration demonstrate normal caliber thoracic aorta. No evidence of aortic aneurysm or dissection. Normal heart size. Central and proximal segmental pulmonary arteries are well opacified without evidence of significant pulmonary embolus. Great vessel origins are patent. Moderate-sized esophageal hiatal hernia. Esophagus is mostly decompressed. No significant lymphadenopathy in the chest. Heterogeneous enlargement of the thyroid gland with 2.2 cm diameter calcified nodule in the left thyroid and partially calcified substernal goiter measuring 3.1 cm diameter. Consider elective follow-up of thyroid ultrasound. Atelectasis in the lung bases. Patchy mosaic attenuation pattern  throughout the lungs. This could be due to air trapping, edema, or alveolar infiltration. No pneumothorax. No pleural effusions. Review of the MIP images confirms the above findings. CTA ABDOMEN AND PELVIS FINDINGS Normal caliber abdominal aorta with a few scattered calcifications. No evidence of aortic aneurysm or dissection. The abdominal aorta, celiac axis, superior mesenteric artery, duplicated bilateral renal arteries, inferior mesenteric artery, and bilateral iliac, external iliac, internal iliac, and common femoral arteries are patent. Diffuse fatty infiltration of the liver. Surgical absence of the gallbladder. No bile duct dilatation. The spleen, pancreas, adrenal glands, kidneys, inferior vena cava, and retroperitoneal lymph nodes are unremarkable. Stomach, small bowel, and colon are not abnormally distended. No free air or free fluid in the abdomen. Pelvis: Scattered diverticula in the colon. No evidence of diverticulitis. Uterus is surgically absent. No abnormal adnexal mass or lymphadenopathy. Appendix is normal. Bladder wall is not thickened. Mild degenerative changes in the spine. No destructive bone lesions. Review of the MIP images  confirms the above findings. IMPRESSION: No evidence of aneurysm or dissection in the thoracic or abdominal aorta. No evidence of significant pulmonary embolus. Incidental note of diffusely enlarged and partially calcified thyroid gland with multiple nodules, likely due to goiter. Mosaic attenuation pattern lungs may be due to air trapping, edema, or infiltration. Diffuse fatty infiltration of the liver. Electronically Signed   By: Lucienne Capers M.D.   On: 08/20/2015 23:07    Procedures Procedures (including critical care time)  Medications Ordered in ED Medications  nadolol (CORGARD) tablet 40 mg (40 mg Oral Given 08/21/15 0831)  lisinopril (PRINIVIL,ZESTRIL) tablet 5 mg (5 mg Oral Given 08/21/15 0832)  pantoprazole (PROTONIX) EC tablet 40 mg (40 mg Oral Given 08/21/15 0832)  albuterol (PROVENTIL) (2.5 MG/3ML) 0.083% nebulizer solution 2.5 mg (not administered)  sucralfate (CARAFATE) tablet 1 g (1 g Oral Not Given 08/21/15 0830)  oxyCODONE-acetaminophen (PERCOCET/ROXICET) 5-325 MG per tablet 1-2 tablet (1 tablet Oral Given 08/21/15 0109)  acidophilus (RISAQUAD) capsule 1 capsule (1 capsule Oral Given 08/21/15 0831)  fluticasone (FLONASE) 50 MCG/ACT nasal spray 1 spray (not administered)  rizatriptan (MAXALT-MLT) disintegrating tablet 5 mg (not administered)  aspirin tablet 325 mg (not administered)  loratadine (CLARITIN) tablet 10 mg (10 mg Oral Given 08/21/15 0834)  0.9 %  sodium chloride infusion ( Intravenous New Bag/Given 08/21/15 0112)  senna-docusate (Senokot-S) tablet 1 tablet (not administered)  iopamidol (ISOVUE-370) 76 % injection 100 mL (100 mLs Intravenous Contrast Given 08/20/15 2211)  sodium chloride 0.9 % bolus 1,000 mL (0 mLs Intravenous Stopped 08/21/15 0122)  LORazepam (ATIVAN) injection 0.5 mg (0.5 mg Intravenous Given 08/21/15 0038)   stroke: mapping our early stages of recovery book (1 each Does not apply Given 08/21/15 0045)  LORazepam (ATIVAN) injection 0.5 mg (0.5 mg  Intravenous Given 08/21/15 0444)     Initial Impression / Assessment and Plan / ED Course  I have reviewed the triage vital signs and the nursing notes.  Pertinent labs & imaging results that were available during my care of the patient were reviewed by me and considered in my medical decision making (see chart for details).  Clinical Course   65 yo F with PMHx of HTN, HLD, MVP, nephrolithiasis who p/w acute, transient amnesia along with left flank pain. On arrival, VSS and WNL. Pt immediately assessed re: her loss of memory. She has otherwise no focal neuro deficits to suggest CVA. Suspect acute, transient global amnesia but given flank pain, chest pain, HA, and now memory loss, must consider aortic  dissection with intracranial extension as pt's presentation is otherwise very atypical. Will check stat CT head, C/A/P, and re-assess. No fevers or signs of recent infection, meningitis, or encephalitis.  CT Head, C/A/P negative for acute abnormality. Labs o/w reassuring. Pt does have hematuria c/w possible passed stone. She remains amnesic. Discussed with Neurology Dr. Nicole Kindred. Will order MRI, transfer to [facility] for further evaluation of TGA.  Final Clinical Impressions(s) / ED Diagnoses   Final diagnoses:  Acute encephalopathy  Left flank pain    New Prescriptions Current Discharge Medication List       Duffy Bruce, MD 08/21/15 1004

## 2015-08-21 NOTE — Discharge Summary (Signed)
Physician Discharge Summary  Anna Marquez DOB: Nov 16, 1950 DOA: 08/20/2015  PCP: Joycelyn Man, MD  Admit date: 08/20/2015 Discharge date: 08/21/2015  Time spent: 65 minutes  Recommendations for Outpatient Follow-up:  1. Follow-up with TODD,JEFFREY ALLEN, MD in 1-2 weeks. On follow-up patient will need a basic metabolic profile done to follow-up on electrolytes and renal function. Patient's PCP on the to reassess patient's paroxysmal A. fib and may discuss with patient's cardiologist as to whether patient is to be placed on anticoagulation. Patient's left flank pain also need to be reassessed at that time.   Discharge Diagnoses:  Principal Problem:   Transient global amnesia Active Problems:   Migraine without aura   Mitral regurgitation   GERD   Paroxysmal a-fib (HCC)   Essential hypertension   Acute encephalopathy   Left flank pain   Discharge Condition: Stable and improved  Diet recommendation: Heart healthy  Filed Weights   08/20/15 2118 08/21/15 0200  Weight: 72.6 kg (160 lb) 73.7 kg (162 lb 8 oz)    History of present illness:  Per Dr.Niu  Anna Marquez is a 65 y.o. female with medical history significant of atrial fibrillation not on anticoagulants, hypertension, GERD, kidney stone, mitral valve prolapse who presented with altered mental status, left flank pain.  Per her daughter, pt started having left flank pain at about 6 PM. It was associated with nausea and dry heave. No diarrhea. The pain was constant, 7 out of 10 in severity, nonradiating. It was not aggravated or alleviated by any known factors. Patient has hematuria. Denied symptoms of UTI. Patient was noted to be confused and disoriented. She was asking same questions over and over and was unable to remember on a short-term basis. Patient did not have unilateral weakness, numbness or tingling in her extremities. No vision change or hearing loss. Patient did not have chest pain, shortness  breath, fever or chills. She had no previous history of memory abnormalities.  ED Course: pt was found to have negative troponin, urinalysis was small amount of leukocyte and a sulfa red blood cells, temperature normal, no tachycardia, electrolytes and renal function okay. CT head is negative for acute intracranial abnormalities. CT angiogram of the chest/abdomen/pelvis showed no evidence of aneurysm or dissection in the thoracic or abdominal aorta. No evidence of significant pulmonary embolus. Incidental note of diffusely enlarged and partially calcified thyroid gland with multiple nodules, likely due to goiter. Mosaic attenuation pattern lungs may be due to air trapping, edema, or infiltration. Diffuse fatty infiltration of the liver. Pt is placed on tele bed for obs. Neurology, Dr. Nicole Kindred was consulted.   Hospital Course:  #1 transient global amnesia Patient represented with symptoms consistent with transient global amnesia which had resolved in the emergency room. Patient had no focal deficits and had normal mentation after episode had resolved. It was noted that patient had a prior history of paroxysmal atrial fibrillation not on anticoagulation. It was recommended per neurology that patient be admitted to rule out TIA and seizures. MRI of the head was done which was negative for any acute abnormalities. Head CT was negative. CT angiogram chest abdomen and pelvis was also negative. EEG was done which showed a normal EEG for patient's stated age with no focal, hemispheric or lateralizing features. No epileptiform activity was recorded. Patient had no further episodes during the rest of the hospitalization be discharged in stable and improved condition.  #2 paroxysmal atrial fibrillation CHA2DSVASC2 score 3 Patient noted to have a history of  paroxysmal atrial fibrillation not on anticoagulation. In discussion with patient it was stated that she was not anticoagulation per recommendation from her  cardiologist and was placed on a full dose aspirin. Reviewed records from PCPs office from visit of 01/27/2012 which showed that patient was assessed by PCP and he was felt that patient did not have that much atrial fibrillation to the point where she needed anticoagulation. It was recommended that patient increase aspirin to 325 mg daily at that time. During this hospitalization patient had no episodes of atrial fibrillation. EKG done on admission showed a normal sinus rhythm. Patient was maintained on home regimen of nadolol and remained rate controlled. Patient be discharged in stable and improved condition and is to follow-up with PCP as outpatient.  #3 left flank pain Likely secondary to recurrent kidney stone which may have passed. Patient had presented to the ED initially with complaints of left flank pain. Urinalysis which was done was positive for hematuria. Nitrite was negative with small leukocytes. Patient denied any dysuria. CT stone protocol which was done showed no evidence of obstructive uropathy. No nephrolithiasis. Moderate-sized fascial hernia unchanged compared to prior examination. Patient's pain improved clinically during the hospitalization. Patient be discharged home in stable and improved condition on as needed pain medication. Outpatient follow-up.  #4 hypertension Stable. Patient was maintained on home regimen of lisinopril and atenolol. HCTZ was held and will be resumed on discharge.  #5 migraine without aura Stable.  Procedures:  CT abdomen and pelvis stone protocol 08/21/2015  CT angiogram chest abdomen and pelvis 08/20/2015  CT head 08/20/2015  MRI head 08/21/2015  EEG 08/21/2015  Consultations:  Neurology: Dr. Nicole Kindred 08/21/2015  Discharge Exam: Vitals:   08/21/15 1218 08/21/15 1400  BP: (!) 157/89 131/82  Pulse: 72 63  Resp: 18 18  Temp: 97.7 F (36.5 C) 97.7 F (36.5 C)    General: NAD Cardiovascular: RRR Respiratory: CTAB  Discharge  Instructions   Discharge Instructions    Diet - low sodium heart healthy    Complete by:  As directed   Increase activity slowly    Complete by:  As directed     Current Discharge Medication List    START taking these medications   Details  senna-docusate (SENOKOT-S) 8.6-50 MG tablet Take 1 tablet by mouth at bedtime as needed for mild constipation.      CONTINUE these medications which have CHANGED   Details  hydrochlorothiazide (HYDRODIURIL) 25 MG tablet Take 1 tablet (25 mg total) by mouth daily. For fluid retention   Associated Diagnoses: Essential hypertension    oxyCODONE-acetaminophen (PERCOCET/ROXICET) 5-325 MG tablet Take 1-2 tablets by mouth every 4 (four) hours as needed (moderate to severe pain (when tolerating fluids)). Qty: 15 tablet, Refills: 0      CONTINUE these medications which have NOT CHANGED   Details  albuterol (PROVENTIL HFA;VENTOLIN HFA) 108 (90 BASE) MCG/ACT inhaler Inhale 2 puffs into the lungs every 6 (six) hours as needed. For shortness of breath or wheezing Qty: 1 Inhaler, Refills: 0    aspirin 325 MG tablet Take 325 mg by mouth daily as needed (afib, chest pains).     fluticasone (FLONASE) 50 MCG/ACT nasal spray USE ONE SPRAY IN EACH NOSTRIL DAILY. Qty: 32 g, Refills: 6   Associated Diagnoses: Allergic rhinitis, cause unspecified    lisinopril (PRINIVIL,ZESTRIL) 5 MG tablet TAKE 1 TABLET BY MOUTH EVERY DAY Qty: 90 tablet, Refills: 1    loratadine (CLARITIN) 10 MG tablet Take 10 mg by mouth  daily as needed for allergies. For allergies    nadolol (CORGARD) 40 MG tablet Take 40 mg by mouth 2 (two) times daily. Refills: 1    pantoprazole (PROTONIX) 40 MG tablet TAKE 1 TABLET BY MOUTH EVERY DAY Qty: 90 tablet, Refills: 1    Probiotic Product (PROBIOTIC PO) Take 1 tablet by mouth daily.    rizatriptan (MAXALT-MLT) 5 MG disintegrating tablet Take 1 tablet (5 mg total) by mouth as needed for migraine. May repeat in 2 hours if needed Qty: 10  tablet, Refills: 0   Associated Diagnoses: Migraine without aura, without mention of intractable migraine without mention of status migrainosus    sucralfate (CARAFATE) 1 G tablet TAKE ONE TABLET BY MOUTH TWICE DAILY Qty: 200 tablet, Refills: 1       Allergies  Allergen Reactions  . Penicillins Hives and Shortness Of Breath    Has patient had a PCN reaction causing immediate rash, facial/tongue/throat swelling, SOB or lightheadedness with hypotension: yes Has patient had a PCN reaction causing severe rash involving mucus membranes or skin necrosis: no Has patient had a PCN reaction that required hospitalization: unknown Has patient had a PCN reaction occurring within the last 10 years: no If all of the above answers are "NO", then may proceed with Cephalosporin use.   . Ibuprofen Other (See Comments)    Pt states allergy to NSAIDS is secondary to ulcers NOT anaphylaxis. Tolerates daily 325 ASA at home  . Erythromycin Nausea And Vomiting    REACTION: Stomach upset   Follow-up Information    TODD,JEFFREY ALLEN, MD. Schedule an appointment as soon as possible for a visit in 1 week(s).   Specialty:  Family Medicine Why:  f/u in 1-2 weeks. Contact information: [place] [address] [phone]            The results of significant diagnostics from this hospitalization (including imaging, microbiology, ancillary and laboratory) are listed below for reference.    Significant Diagnostic Studies: Ct Head Wo Contrast  Result Date: 08/20/2015 CLINICAL DATA:  Acute onset of amnesia and confusion. Altered mental status. Initial encounter. EXAM: CT HEAD WITHOUT CONTRAST TECHNIQUE: Contiguous axial images were obtained from the base of the skull through the vertex without intravenous contrast. COMPARISON:  None. FINDINGS: There is no evidence of acute infarction, mass lesion, or intra- or extra-axial hemorrhage on CT. The posterior fossa, including the cerebellum,  brainstem and fourth ventricle, is within normal limits. The third and lateral ventricles, and basal ganglia are unremarkable in appearance. The cerebral hemispheres are symmetric in appearance, with normal gray-white differentiation. No mass effect or midline shift is seen. There is no evidence of fracture; visualized osseous structures are unremarkable in appearance. The visualized portions of the orbits are within normal limits. The paranasal sinuses and mastoid air cells are well-aerated. No significant soft tissue abnormalities are seen. IMPRESSION: Unremarkable noncontrast CT of the head. Electronically Signed   By: Garald Balding M.D.   On: 08/20/2015 22:56   Mr Brain Wo Contrast  Result Date: 08/21/2015 CLINICAL DATA:  Initial evaluation for acute encephalopathy. EXAM: MRI HEAD WITHOUT CONTRAST TECHNIQUE: Multiplanar, multiecho pulse sequences of the brain and surrounding structures were obtained without intravenous contrast. COMPARISON:  Prior CT from 08/20/2015. FINDINGS: Age appropriate cerebral volume loss present. Minimal patchy T2/FLAIR hyperintensity within the periventricular and deep white matter both cerebral hemispheres, nonspecific, but felt to be within normal limits for patient age. No abnormal foci of restricted diffusion to suggest acute ischemia. Gray-white  matter differentiation maintained. Major intracranial vascular flow voids are preserved. No acute or chronic intracranial hemorrhage. No areas of chronic infarction. No mass lesion, midline shift, or mass effect. No hydrocephalus. No extra-axial fluid collection. Major dural sinuses are grossly patent. Craniocervical junction within normal limits. Scattered multilevel degenerative spondylolysis noted within the visualized upper cervical spine, most prevalent at C4-5. Resultant mild stenosis at this level. Pituitary gland within normal limits. No acute abnormality about the globes and orbits. Mild scattered mucosal thickening within  the ethmoidal air cells and maxillary sinuses. Paranasal sinuses are otherwise clear. No mastoid effusion. Inner ear structures normal. Bone marrow signal intensity within normal limits. No scalp soft tissue abnormality. IMPRESSION: Normal brain MRI for patient age. No acute intracranial process identified. Electronically Signed   By: Jeannine Boga M.D.   On: 08/21/2015 05:47   Mm Screening Breast Tomo Bilateral  Result Date: 08/08/2015 CLINICAL DATA:  Screening. EXAM: 2D DIGITAL SCREENING BILATERAL MAMMOGRAM WITH CAD AND ADJUNCT TOMO COMPARISON:  Previous exam(s). ACR Breast Density Category b: There are scattered areas of fibroglandular density. FINDINGS: There are no findings suspicious for malignancy. Images were processed with CAD. IMPRESSION: No mammographic evidence of malignancy. A result letter of this screening mammogram will be mailed directly to the patient. RECOMMENDATION: Screening mammogram in one year. (Code:SM-B-01Y) BI-RADS CATEGORY  1: Negative. Electronically Signed   By: Ammie Ferrier M.D.   On: 08/08/2015 08:36   Ct Renal Stone Study  Result Date: 08/21/2015 CLINICAL DATA:  Left flank pain and microscopic hematuria EXAM: CT ABDOMEN AND PELVIS WITHOUT CONTRAST TECHNIQUE: Multidetector CT imaging of the abdomen and pelvis was performed following the standard protocol without IV contrast. COMPARISON:  CT abdomen and pelvis 07/05/2004 FINDINGS: Lower chest:  No pulmonary nodules or masses.  No pleural effusion. Hepatobiliary: Status post cholecystectomy. Calcified granuloma noted in the right hepatic lobe. No focal liver lesions identified on this noncontrast examination. No ascites. No biliary dilatation. Pancreas: Normal Spleen: Normal Adrenals/Urinary Tract: Adrenal glands are normal. Areas of hyperdensity within the renal collecting systems, secondary to recent contrast administration. There is no hydronephrosis. No nephrolithiasis or ureterolithiasis. There are numerous  pelvic phleboliths noted. Stomach/Bowel: There is a moderate-sized hiatal hernia. There is rectosigmoid diverticulosis without evidence of acute diverticulitis. The appendix is normal. No fluid collection in the abdomen. Vascular/Lymphatic: Course and caliber of the major abdominal vessels are normal. There is no abdominal or pelvic lymphadenopathy. Reproductive: Multiple uterine calcifications, likely indicating underlying fibroids. Normal left ovary. Right ovary not clearly identified. No free fluid in the pelvis. Other: None Musculoskeletal: Multilevel lumbar facet arthrosis. No lytic or blastic osseous lesions. IMPRESSION: 1. No evidence of obstructive uropathy.  No nephrolithiasis. 2. Moderate-sized hiatal hernia unchanged compared to prior examination. Electronically Signed   By: Ulyses Jarred M.D.   On: 08/21/2015 16:37   Ct Angio Chest/abd/pel For Dissection W And/or Wo Contrast  Result Date: 08/20/2015 CLINICAL DATA:  Confusion.  Chest pain and abdominal pain. EXAM: CT ANGIOGRAPHY CHEST, ABDOMEN AND PELVIS TECHNIQUE: Multidetector CT imaging through the chest, abdomen and pelvis was performed using the standard protocol during bolus administration of intravenous contrast. Multiplanar reconstructed images and MIPs were obtained and reviewed to evaluate the vascular anatomy. CONTRAST:  100 mL Isovue 370 COMPARISON:  None. FINDINGS: CTA CHEST FINDINGS Noncontrast CT images of the chest obtained demonstrate mild coronary artery calcification. Small pericardial effusion or thickening. No evidence of intramural hematoma. Images obtained during the arterial phase after contrast administration demonstrate normal caliber thoracic  aorta. No evidence of aortic aneurysm or dissection. Normal heart size. Central and proximal segmental pulmonary arteries are well opacified without evidence of significant pulmonary embolus. Great vessel origins are patent. Moderate-sized esophageal hiatal hernia. Esophagus is mostly  decompressed. No significant lymphadenopathy in the chest. Heterogeneous enlargement of the thyroid gland with 2.2 cm diameter calcified nodule in the left thyroid and partially calcified substernal goiter measuring 3.1 cm diameter. Consider elective follow-up of thyroid ultrasound. Atelectasis in the lung bases. Patchy mosaic attenuation pattern throughout the lungs. This could be due to air trapping, edema, or alveolar infiltration. No pneumothorax. No pleural effusions. Review of the MIP images confirms the above findings. CTA ABDOMEN AND PELVIS FINDINGS Normal caliber abdominal aorta with a few scattered calcifications. No evidence of aortic aneurysm or dissection. The abdominal aorta, celiac axis, superior mesenteric artery, duplicated bilateral renal arteries, inferior mesenteric artery, and bilateral iliac, external iliac, internal iliac, and common femoral arteries are patent. Diffuse fatty infiltration of the liver. Surgical absence of the gallbladder. No bile duct dilatation. The spleen, pancreas, adrenal glands, kidneys, inferior vena cava, and retroperitoneal lymph nodes are unremarkable. Stomach, small bowel, and colon are not abnormally distended. No free air or free fluid in the abdomen. Pelvis: Scattered diverticula in the colon. No evidence of diverticulitis. Uterus is surgically absent. No abnormal adnexal mass or lymphadenopathy. Appendix is normal. Bladder wall is not thickened. Mild degenerative changes in the spine. No destructive bone lesions. Review of the MIP images confirms the above findings. IMPRESSION: No evidence of aneurysm or dissection in the thoracic or abdominal aorta. No evidence of significant pulmonary embolus. Incidental note of diffusely enlarged and partially calcified thyroid gland with multiple nodules, likely due to goiter. Mosaic attenuation pattern lungs may be due to air trapping, edema, or infiltration. Diffuse fatty infiltration of the liver. Electronically Signed    By: Lucienne Capers M.D.   On: 08/20/2015 23:07    Microbiology: No results found for this or any previous visit (from the past 240 hour(s)).   Labs: Basic Metabolic Panel:  Recent Labs Lab 08/20/15 2204 08/21/15 0556  NA 138 140  K 3.5 3.5  CL 107 108  CO2 21* 24  GLUCOSE 106* 80  BUN 15 10  CREATININE 0.85 0.83  CALCIUM 9.5 9.1   Liver Function Tests:  Recent Labs Lab 08/20/15 2204  AST 19  ALT 21  ALKPHOS 55  BILITOT 2.4*  PROT 8.1  ALBUMIN 4.6    Recent Labs Lab 08/20/15 2204  LIPASE 25   No results for input(s): AMMONIA in the last 168 hours. CBC:  Recent Labs Lab 08/20/15 2204  WBC 9.0  NEUTROABS 6.6  HGB 13.6  HCT 37.5  MCV 84.7  PLT 159   Cardiac Enzymes: No results for input(s): CKTOTAL, CKMB, CKMBINDEX, TROPONINI in the last 168 hours. BNP: BNP (last 3 results)  Recent Labs  08/20/15 2204  BNP 42.2    ProBNP (last 3 results) No results for input(s): PROBNP in the last 8760 hours.  CBG:  Recent Labs Lab 08/20/15 2155 08/21/15 0621  GLUCAP 108* 82       Signed:  THOMPSON,DANIEL MD.  [facility] 08/21/2015, 5:17 PM

## 2015-08-22 LAB — URINE CULTURE

## 2015-08-22 LAB — HEMOGLOBIN A1C
Hgb A1c MFr Bld: 5.1 % (ref 4.8–5.6)
MEAN PLASMA GLUCOSE: 100 mg/dL

## 2015-08-28 ENCOUNTER — Ambulatory Visit (INDEPENDENT_AMBULATORY_CARE_PROVIDER_SITE_OTHER): Payer: PPO | Admitting: Family Medicine

## 2015-08-28 VITALS — BP 110/80 | HR 84 | Temp 98.6°F | Ht 64.0 in | Wt 165.0 lb

## 2015-08-28 DIAGNOSIS — I480 Paroxysmal atrial fibrillation: Secondary | ICD-10-CM | POA: Diagnosis not present

## 2015-08-28 DIAGNOSIS — G454 Transient global amnesia: Secondary | ICD-10-CM

## 2015-08-28 DIAGNOSIS — I10 Essential (primary) hypertension: Secondary | ICD-10-CM | POA: Diagnosis not present

## 2015-08-28 DIAGNOSIS — I340 Nonrheumatic mitral (valve) insufficiency: Secondary | ICD-10-CM

## 2015-08-28 DIAGNOSIS — R109 Unspecified abdominal pain: Secondary | ICD-10-CM

## 2015-08-28 NOTE — Progress Notes (Signed)
Subjective:     Patient ID: Anna Marquez, female   DOB: [birth year], 65 y.o.   MRN: [id number]  HPI Patient seen for hospital follow-up. On 08/20/2015 she developed some left flank pain which was followed by gross hematuria. She took a Percocet at home and was on her way to emergency department being transported by a friend when she developed some transient confusion. She was admitted for further evaluation  She has long history of prior kidney stones and her recent symptoms were very similar. In the ED, she apparently was asking same questions over and over and unable to remember short-term. Did not have any focal weakness, slurred speech, chest pains, or any visual difficulties. Troponins were negative. Patient had CT head which showed no acute abnormalities. CT angiogram chest, abdomen, pelvis no aneurysm. No pulmonary embolus  Patient seen in consultation by neurology. She was felt to have transient global amnesia. She was admitted to rule out TIA and seizure. MRI of the head unremarkable. EEG no evidence for seizures. Symptoms were very transient and fully cleared prior to discharge  History of transient atrial fibrillation. No evidence for A. fib during hospitalization. Discussion of anticoagulation with patient but apparently she has not had any evidence for A. fib in recent years. She does take an aspirin  Left flank pain basically resolved shortly after she got to ER. CT stone protocol no obstructive uropathy and no nephrolithiasis. No evidence for UTI.  Patient feels well at this time. No further confusion. No flank pain. There had been discussion of echocardiogram but she was unable to get this scheduled prior to discharge.  Hx of MVP and mitral regurgitation.  Past Medical History:  Diagnosis Date  . Adenomatous colon polyp 2003  . Cholelithiasis   . daughter had blood clots in lungs from anesthesiae   . Diverticulosis of colon   . Gastric polyps   . GERD (gastroesophageal reflux  disease)   . Helicobacter pylori (H. pylori)   . Hiatal hernia   . Hypertension   . Mitral valve prolapse   . Nephrolithiasis   . Osteoarthritis   . Osteopenia 04/28/2014   T score -1.5 FRAX 8.2%/0.7%   Past Surgical History:  Procedure Laterality Date  . ANTERIOR AND POSTERIOR REPAIR N/A 06/14/2014   Procedure: ANTERIOR (CYSTOCELE) AND POSTERIOR REPAIR (RECTOCELE);  Surgeon: Anastasio Auerbach, MD;  Location: [facility];  Service: Gynecology;  Laterality: N/A;  . CHOLECYSTECTOMY    . kidney stone removal     x2  . VAGINAL HYSTERECTOMY N/A 06/14/2014   Procedure: HYSTERECTOMY VAGINAL;  Surgeon: Anastasio Auerbach, MD;  Location: [facility];  Service: Gynecology;  Laterality: N/A;    reports that she has never smoked. She has never used smokeless tobacco. She reports that she drinks alcohol. She reports that she does not use drugs. family history includes Breast cancer (age of onset: 74) in her paternal aunt; Cancer in her father; Diabetes in her maternal grandmother; Heart disease in her maternal grandfather and maternal grandmother; Melanoma in her paternal aunt. Allergies  Allergen Reactions  . Penicillins Hives and Shortness Of Breath    Has patient had a PCN reaction causing immediate rash, facial/tongue/throat swelling, SOB or lightheadedness with hypotension: yes Has patient had a PCN reaction causing severe rash involving mucus membranes or skin necrosis: no Has patient had a PCN reaction that required hospitalization: unknown Has patient had a PCN reaction occurring within the last 10 years: no If all of the above answers are "  NO", then may proceed with Cephalosporin use.   . Ibuprofen Other (See Comments)    Pt states allergy to NSAIDS is secondary to ulcers NOT anaphylaxis. Tolerates daily 325 ASA at home  . Erythromycin Nausea And Vomiting    REACTION: Stomach upset     Review of Systems  Constitutional: Negative for chills, fatigue, fever and unexpected weight change.  Eyes:  Negative for visual disturbance.  Respiratory: Negative for cough, chest tightness, shortness of breath and wheezing.   Cardiovascular: Negative for chest pain, palpitations and leg swelling.  Gastrointestinal: Negative for abdominal pain.  Genitourinary: Negative for dysuria and hematuria.  Neurological: Negative for dizziness, seizures, syncope, weakness, light-headedness and headaches.       Objective:   Physical Exam  Constitutional: She is oriented to person, place, and time. She appears well-developed and well-nourished. No distress.  Neck: Neck supple. No thyromegaly present.  Cardiovascular: Normal rate and regular rhythm.   Pulmonary/Chest: Effort normal and breath sounds normal. No respiratory distress. She has no wheezes. She has no rales.  Abdominal: Soft. Bowel sounds are normal. She exhibits no distension and no mass. There is no tenderness. There is no rebound and no guarding.  Musculoskeletal: She exhibits no edema.  Neurological: She is alert and oriented to person, place, and time. No cranial nerve deficit. Coordination normal.  Psychiatric: She has a normal mood and affect. Her behavior is normal. Judgment and thought content normal.       Assessment:     #1 recent transient confusion. Suspected transient global amnesia with symptoms fully resolved  #2 left flank pain. Suspect patient passed kidney stone recently. Symptoms fully resolved at this time  #3 remote history of transient atrial fibrillation.  Clinically, normal rhythm at this time.    Plan:     -Follow-up problems for any recurrent confusion -Set up echocardiogram to further assess heart -Stay well-hydrated -Follow-up for any recurrent hematuria or flank pain -She has scheduled follow-up with primary for physical in several months  Eulas Post MD [facility]

## 2015-08-28 NOTE — Progress Notes (Signed)
Pre visit review using our clinic review tool, if applicable. No additional management support is needed unless otherwise documented below in the visit note. 

## 2015-08-28 NOTE — Patient Instructions (Signed)
We will set up echocardiogram Follow up for any recurrent confusion or flank pain.

## 2015-09-04 ENCOUNTER — Ambulatory Visit: Payer: PPO | Admitting: Family Medicine

## 2015-09-12 ENCOUNTER — Encounter: Payer: Self-pay | Admitting: Family Medicine

## 2015-09-12 ENCOUNTER — Telehealth: Payer: Self-pay | Admitting: *Deleted

## 2015-09-12 ENCOUNTER — Ambulatory Visit (INDEPENDENT_AMBULATORY_CARE_PROVIDER_SITE_OTHER): Payer: PPO | Admitting: Family Medicine

## 2015-09-12 VITALS — BP 150/98 | HR 79 | Temp 98.3°F | Ht 64.0 in | Wt 164.7 lb

## 2015-09-12 DIAGNOSIS — Z23 Encounter for immunization: Secondary | ICD-10-CM | POA: Diagnosis not present

## 2015-09-12 DIAGNOSIS — I10 Essential (primary) hypertension: Secondary | ICD-10-CM | POA: Diagnosis not present

## 2015-09-12 MED ORDER — LISINOPRIL-HYDROCHLOROTHIAZIDE 10-12.5 MG PO TABS: 1.0000 | ORAL_TABLET | Freq: Every day | ORAL | 3 refills | Status: DC

## 2015-09-12 NOTE — Telephone Encounter (Signed)
Per Marcellina Millin D,( Cancel Rsn: Provider (cancelled per Neoma Laming..Dr. Sherren Mocha no longer wants this patient to have this test)

## 2015-09-12 NOTE — Progress Notes (Signed)
Pre visit review using our clinic review tool, if applicable. No additional management support is needed unless otherwise documented below in the visit note. 

## 2015-09-12 NOTE — Patient Instructions (Signed)
Zestoretic 10-12 0.5........[name] 1 daily in the morning  Corgard 40 mg.......... one twice daily  Discontinue the lisinopril 10 mg plain and the hydrochlorothiazide 25 mg  Purchase a [employer] pump up digital blood pressure cuff...Marland KitchenMarland KitchenMarland Kitchen [place].....[name] check your blood pressure daily in the morning  Return in 3 weeks for follow-up

## 2015-09-12 NOTE — Progress Notes (Signed)
Anna Marquez is a 65 year old female single nonsmoker who comes in today for follow-up of hypertension  Her blood pressure on lisinopril 5 mg daily and Corgard 40 mg twice a day is 160/90 right arm sitting position. Repeat by me same. Repeat by nurse right and left arm the same. She says her blood pressure comes 124/80 however cuff is not working obviously.  She went to the emergency room this summer because of back pain and hematuria. She's had kidney stones in the past. In the emergency room she had a 4 hour episode of transient global amnesia. She was seen by neurology and had a complete evaluation including MRI. She was discharged with a diagnosis of transient global amnesia. Since that time she's felt well and has no problems.  She's due a shingles vaccine Pneumovax and flu shot  Physical exam......[name] vital signs stable she's afebrile except for BP 160/90 right arm sitting position so pulse 70 and regular  Hypertension not at goal.......... increase lisinopril to 10 mg daily continue the hydrochlorothiazide 25 daily and Corgard 40 mg twice a day follow-up in one month.

## 2015-09-13 ENCOUNTER — Other Ambulatory Visit (HOSPITAL_COMMUNITY): Payer: PPO

## 2015-09-21 ENCOUNTER — Ambulatory Visit (INDEPENDENT_AMBULATORY_CARE_PROVIDER_SITE_OTHER): Payer: PPO | Admitting: Gynecology

## 2015-09-21 ENCOUNTER — Encounter: Payer: Self-pay | Admitting: Gynecology

## 2015-09-21 VITALS — BP 120/78 | Ht 65.0 in | Wt 166.0 lb

## 2015-09-21 DIAGNOSIS — N952 Postmenopausal atrophic vaginitis: Secondary | ICD-10-CM

## 2015-09-21 DIAGNOSIS — Z01419 Encounter for gynecological examination (general) (routine) without abnormal findings: Secondary | ICD-10-CM

## 2015-09-21 DIAGNOSIS — M8580 Other specified disorders of bone density and structure, unspecified site: Secondary | ICD-10-CM

## 2015-09-21 NOTE — Patient Instructions (Signed)

## 2015-09-21 NOTE — Progress Notes (Signed)
    Anna Marquez Sep 05, 1950 [id number]        65 y.o.  G2P2  for breast and pelvic exam.  Past medical history,surgical history, problem list, medications, allergies, family history and social history were all reviewed and documented as reviewed in the EPIC chart.  ROS:  Performed with pertinent positives and negatives included in the history, assessment and plan.   Additional significant findings :  None   Exam: Anna Marquez assistant Vitals:   09/21/15 1421  BP: 120/78  Weight: 166 lb (75.3 kg)  Height: 5\' 5"  (1.651 m)   Body mass index is 27.62 kg/m.  General appearance:  Normal affect, orientation and appearance. Skin: Grossly normal HEENT: Without gross lesions.  No cervical or supraclavicular adenopathy. Thyroid normal.  Lungs:  Clear without wheezing, rales or rhonchi Cardiac: RR, without RMG Abdominal:  Soft, nontender, without masses, guarding, rebound, organomegaly or hernia Breasts:  Examined lying and sitting without masses, retractions, discharge or axillary adenopathy. Pelvic:  Ext/BUS/Vagina with atrophic changes. Good vaginal support  Adnexa without masses or tenderness    Anus and perineum normal   Rectovaginal normal sphincter tone without palpated masses or tenderness.    Assessment/Plan:  65 y.o. G2P2 female for breast and pelvic exam.  1. Postmenopausal/atrophic genital changes. No significant hot flushes, night sweats or vaginal dryness. Continue to monitor report any issues. 2. Osteopenia. DEXA 04/2014 T score -1.5 FRAX 8%/0.7%. Plan repeat DEXA next year at 2 year interval. 3. Pap smear 2015. No Pap smear done today. Options for stopping Pap smears per current screening guidelines as she is status post hysterectomy for benign indications, over the age 27 and no history of significant abnormal Pap smears. Will reassess annual basis. 4. Colonoscopy 2015. Repeat at their recommended interval. 5. Mammography 07/2015. Continue with annual mammography when  due. SBE monthly reviewed. 6. Maintenance. No routine lab work done as patient reports this done elsewhere. Follow up 1 year, sooner as needed.   Anastasio Auerbach MD, 2:39 PM 09/21/2015

## 2015-10-04 ENCOUNTER — Encounter: Payer: Self-pay | Admitting: Family Medicine

## 2015-10-04 ENCOUNTER — Ambulatory Visit (INDEPENDENT_AMBULATORY_CARE_PROVIDER_SITE_OTHER): Payer: PPO | Admitting: Family Medicine

## 2015-10-04 VITALS — BP 124/88 | HR 81 | Temp 98.4°F | Ht 64.0 in | Wt 163.5 lb

## 2015-10-04 DIAGNOSIS — I10 Essential (primary) hypertension: Secondary | ICD-10-CM | POA: Diagnosis not present

## 2015-10-04 NOTE — Patient Instructions (Signed)
Continue current medications  Check your blood pressure weekly at home since its normal. Return when necessary

## 2015-10-04 NOTE — Progress Notes (Signed)
Anna Marquez is a 65 year old single female nonsmoker comes in today for follow-up of hypertension  She's been on Corgard 20 mg twice a day and lisinopril HCTZ 1 daily. Sugar monitoring her blood pressure at home because was elevated when she was here last  Review of systems negative  Vital signs stable she's afebrile blood pressure normal  Reviewing all her blood pressures home they're all normal  Impression #1 hypertension at goal.......... continue current therapy

## 2015-12-06 ENCOUNTER — Ambulatory Visit (INDEPENDENT_AMBULATORY_CARE_PROVIDER_SITE_OTHER): Payer: PPO | Admitting: Family Medicine

## 2015-12-06 ENCOUNTER — Encounter: Payer: Self-pay | Admitting: Family Medicine

## 2015-12-06 VITALS — BP 118/78 | HR 77 | Temp 98.1°F | Wt 168.5 lb

## 2015-12-06 DIAGNOSIS — R319 Hematuria, unspecified: Secondary | ICD-10-CM | POA: Diagnosis not present

## 2015-12-06 LAB — POCT URINALYSIS DIPSTICK
BILIRUBIN UA: NEGATIVE
GLUCOSE UA: NEGATIVE
Ketones, UA: NEGATIVE
NITRITE UA: NEGATIVE
Spec Grav, UA: 1.025
UROBILINOGEN UA: 0.2
pH, UA: 5

## 2015-12-06 MED ORDER — TAMSULOSIN HCL 0.4 MG PO CAPS: 0.4000 mg | ORAL_CAPSULE | Freq: Every day | ORAL | 3 refills | Status: DC

## 2015-12-06 MED ORDER — OXYCODONE-ACETAMINOPHEN 5-325 MG PO TABS: 1.0000 | ORAL_TABLET | ORAL | 0 refills | Status: DC | PRN

## 2015-12-06 MED ORDER — SULFAMETHOXAZOLE-TRIMETHOPRIM 800-160 MG PO TABS: 1.0000 | ORAL_TABLET | Freq: Two times a day (BID) | ORAL | 1 refills | Status: DC

## 2015-12-06 NOTE — Progress Notes (Signed)
Anna Marquez is a 66 year old female nonsmoker who comes in today for evaluation hematuria  She started having bilateral back pain and hematuria last Sunday evening. No fever no chills. She passed a kidney stone in August. CT scan at that time did not show any stones.  Review of systems otherwise negative  Physical examination....BP 118/78 (BP Location: Left Arm, Patient Position: Sitting, Cuff Size: Normal)   Pulse 77   Temp 98.1 F (36.7 C) (Oral)   Wt 168 lb 8 oz (76.4 kg)   BMI 28.92 kg/m  Abdomen back negative  Urinalysis shows 3+ blood no nitrate no white cells  Impression hematuria probably secondary to kidney stones  Plan....... drink lots of water, Flomax 0.4 daily, Percocet when necessary for pain, return in one week for follow-up sooner if any problems

## 2015-12-06 NOTE — Patient Instructions (Addendum)
Flomax 0.4......... one tablet daily into you pass the stone  Strain all urines  Return in one week for follow-up sooner if any problems  Percocet........[name] 1/2-1 tablet every 4-6 hours as needed for severe pain. If you have severe pain in the medication does not help then I would recommend you come in to the [facility] emergency room  Septra DS..... One twice daily if he develops symptoms of infection

## 2015-12-06 NOTE — Progress Notes (Signed)
Pre visit review using our clinic review tool, if applicable. No additional management support is needed unless otherwise documented below in the visit note. 

## 2015-12-13 ENCOUNTER — Ambulatory Visit (INDEPENDENT_AMBULATORY_CARE_PROVIDER_SITE_OTHER): Payer: PPO | Admitting: Family Medicine

## 2015-12-13 ENCOUNTER — Encounter: Payer: Self-pay | Admitting: Family Medicine

## 2015-12-13 VITALS — BP 130/90 | HR 69 | Temp 98.1°F | Ht 64.0 in | Wt 169.6 lb

## 2015-12-13 DIAGNOSIS — N200 Calculus of kidney: Secondary | ICD-10-CM

## 2015-12-13 LAB — URIC ACID: URIC ACID, SERUM: 5.4 mg/dL (ref 2.4–7.0)

## 2015-12-13 NOTE — Progress Notes (Signed)
Delcina is a 65 year old female who comes in today for follow-up of kidney stones  We saw her a week ago Tuesday with severe left flank pain. She's had kidney stones in the past. We gave her some medicine for pain asked her drink lots of liquids and strainer urines. She strained all urines and past some fine gravelly thing but no distinct stone. She's been asymptomatic and urines been clear since last Thursday.  Review of systems otherwise negative  Vital signs....BP 130/90 (BP Location: Left Arm, Patient Position: Sitting, Cuff Size: Normal)   Pulse 69   Temp 98.1 F (36.7 C) (Oral)   Ht 5\' 4"  (1.626 m)   Wt 169 lb 9.6 oz (76.9 kg)   SpO2 97%   BMI 29.11 kg/m  Urinalysis today shows,,,,,,,,,,,,, was normal no red cells  Impression kidney stone passed.......[name] return when necessary

## 2015-12-13 NOTE — Patient Instructions (Addendum)
Drink lots of liquids  Return when necessary  Lab tests today,,,,,,,, we will call you if it's abnormal

## 2015-12-19 ENCOUNTER — Telehealth: Payer: Self-pay | Admitting: *Deleted

## 2015-12-19 NOTE — Telephone Encounter (Signed)
-----   Message from Dorena Cookey, MD sent at 12/18/2015  1:13 PM EST ----- Please call uric acid level normal. If she has persistent symptoms then I would recommend a consult from Dr. Hurley Cisco ,,,,,, rheumatologist

## 2015-12-19 NOTE — Telephone Encounter (Signed)
Called and spoke with pt informing pt of results and recommendations. Pt states that she had passed the kidney stones and has been feeling better. Nothing further needed at this time, understanding verbalized.

## 2015-12-25 ENCOUNTER — Other Ambulatory Visit: Payer: Self-pay | Admitting: Family Medicine

## 2015-12-26 DIAGNOSIS — L309 Dermatitis, unspecified: Secondary | ICD-10-CM | POA: Diagnosis not present

## 2016-01-09 ENCOUNTER — Telehealth: Payer: Self-pay | Admitting: Family Medicine

## 2016-01-09 ENCOUNTER — Other Ambulatory Visit: Payer: Self-pay | Admitting: Family Medicine

## 2016-01-09 DIAGNOSIS — N200 Calculus of kidney: Secondary | ICD-10-CM

## 2016-01-09 NOTE — Telephone Encounter (Signed)
Pt states Dr Sherren Mocha referred her to Dr Charlestine Night.  Pt called to make an appointment, but states their office needs a referral from Dr Sherren Mocha with office notes

## 2016-01-09 NOTE — Telephone Encounter (Signed)
Dr. Sherren Mocha referral was placed for Dr. Charlestine Night for kidneys stones and he is refusing to see pt in regards to this problems, as he is rheumatologist.  Please advise. Thanks.

## 2016-01-09 NOTE — Telephone Encounter (Signed)
Spoke with pt regarding referral for the rheumatologist. Pt confirmed that Dr. Sherren Mocha did want her to see a rheumatologist so a referral was put in today and I will fax over the last notes to the office.

## 2016-01-10 ENCOUNTER — Encounter: Payer: PPO | Admitting: Family Medicine

## 2016-01-15 ENCOUNTER — Other Ambulatory Visit: Payer: Self-pay | Admitting: Emergency Medicine

## 2016-01-15 DIAGNOSIS — N200 Calculus of kidney: Secondary | ICD-10-CM

## 2016-01-15 NOTE — Telephone Encounter (Signed)
Left message for pt in regards to referral in to urologist

## 2016-01-15 NOTE — Telephone Encounter (Signed)
Please call she needs to see one of the urologist. She can call make her own appointment

## 2016-01-23 ENCOUNTER — Ambulatory Visit (INDEPENDENT_AMBULATORY_CARE_PROVIDER_SITE_OTHER): Payer: PPO | Admitting: Family Medicine

## 2016-01-23 ENCOUNTER — Encounter: Payer: Self-pay | Admitting: Family Medicine

## 2016-01-23 DIAGNOSIS — Z0001 Encounter for general adult medical examination with abnormal findings: Secondary | ICD-10-CM

## 2016-01-23 DIAGNOSIS — H6123 Impacted cerumen, bilateral: Secondary | ICD-10-CM

## 2016-01-23 DIAGNOSIS — I10 Essential (primary) hypertension: Secondary | ICD-10-CM

## 2016-01-23 DIAGNOSIS — M19079 Primary osteoarthritis, unspecified ankle and foot: Secondary | ICD-10-CM

## 2016-01-23 DIAGNOSIS — K219 Gastro-esophageal reflux disease without esophagitis: Secondary | ICD-10-CM | POA: Diagnosis not present

## 2016-01-23 LAB — TSH: TSH: 0.22 u[IU]/mL — ABNORMAL LOW (ref 0.35–4.50)

## 2016-01-23 MED ORDER — LISINOPRIL-HYDROCHLOROTHIAZIDE 10-12.5 MG PO TABS: 1.0000 | ORAL_TABLET | Freq: Every day | ORAL | 4 refills | Status: DC

## 2016-01-23 MED ORDER — NADOLOL 40 MG PO TABS: ORAL_TABLET | ORAL | 4 refills | Status: DC

## 2016-01-23 MED ORDER — PANTOPRAZOLE SODIUM 40 MG PO TBEC: 40.0000 mg | DELAYED_RELEASE_TABLET | Freq: Every day | ORAL | 4 refills | Status: DC

## 2016-01-23 NOTE — Progress Notes (Signed)
Anna Marquez is a 66 year old single female nonsmoker who comes in today for general physical examination because of a history of hypertension  Her blood pressures been well-maintained on Zestoretic 10-12 0.5 and Corgard 40 mg twice a day. BP today 118/78  She's having difficulty with pain in her joints. She has no swelling or redness. She has appointment to see Dr. Launa Grill Marquez tomorrow for rheumatology Evaluation.  She gets routine eye care, dental care, BSE monthly, annual mammography, colonoscopy 2015 was normal. She has a history of colon polyps  She had a uterus removed in 2016 for prolapse ovaries were left intact. Pelvic exam by Dr. Phineas Marquez in July was normal.  Vaccinations all up-to-date  She recalls having a bone density but she does not recall what the report is.  Social history she is single she lives here in the [place] area. Every day at 7 AM she goes to her mother's house. She cares for her from 7 AM to 7 PM 5 days a week sometimes 7 days a week. Her brother stays there at night. She has a black lab which she walks daily. Also her daughter Anna Marquez lives with her 2.  She takes proton X 40 mg daily for chronic reflux, Flonase for allergic rhinitis,  14 point review of systems reviewed and otherwise negative  BP 118/78 (BP Location: Left Arm, Cuff Size: Normal)   Pulse 76   Temp 98.2 F (36.8 C) (Oral)   Ht 5' 4.5" (1.638 m)   Wt 168 lb (76.2 kg)   SpO2 96%   BMI 28.39 kg/m  Examination of the HEENT were negative except for bilateral cerumen impactions which were removed with irrigation. Neck was supple no adenopathy thyroid normal no carotid bruits cardiopulmonary exam normal breast exam normal abdominal exam normal extremities normal skin normal peripheral pulses normal. Pelvic and rectal done July 2017 by GYN therefore not repeated.  Impression  #1 hypertension at goal continue current medication  #2 reflux esophagitis.... Continue proton X  #3 status  post TAH for prolapse 2016.... Ovaries left intact  #4 lower extremity pain etiology unknown........ consult tomorrow with Dr. Launa Grill Marquez

## 2016-01-23 NOTE — Patient Instructions (Signed)
Continue daily walks  Continue current medications  Follow-up in one year sooner if any problems. Dr. [name] did a bone density in 2016

## 2016-01-31 ENCOUNTER — Other Ambulatory Visit (INDEPENDENT_AMBULATORY_CARE_PROVIDER_SITE_OTHER): Payer: PPO

## 2016-01-31 DIAGNOSIS — R7989 Other specified abnormal findings of blood chemistry: Secondary | ICD-10-CM

## 2016-01-31 DIAGNOSIS — R946 Abnormal results of thyroid function studies: Secondary | ICD-10-CM

## 2016-01-31 LAB — T4, FREE: Free T4: 0.83 ng/dL (ref 0.60–1.60)

## 2016-01-31 LAB — T3, FREE: T3 FREE: 3 pg/mL (ref 2.3–4.2)

## 2016-02-29 ENCOUNTER — Telehealth: Payer: Self-pay | Admitting: Family Medicine

## 2016-02-29 NOTE — Telephone Encounter (Signed)
Anna Marquez office called to let you know that this patient has re-scheduled and canceled 4 times. They will no long be able to re-schedule for her any longer.

## 2016-03-05 NOTE — Telephone Encounter (Signed)
Noted  

## 2016-03-07 ENCOUNTER — Observation Stay (HOSPITAL_COMMUNITY): Payer: PPO

## 2016-03-07 ENCOUNTER — Emergency Department (HOSPITAL_COMMUNITY): Payer: PPO

## 2016-03-07 ENCOUNTER — Observation Stay (HOSPITAL_COMMUNITY)
Admission: EM | Admit: 2016-03-07 | Discharge: 2016-03-07 | Disposition: A | Discharge status: Home / Self Care | Payer: PPO | Attending: Internal Medicine | Admitting: Internal Medicine

## 2016-03-07 ENCOUNTER — Observation Stay (HOSPITAL_BASED_OUTPATIENT_CLINIC_OR_DEPARTMENT_OTHER): Payer: PPO

## 2016-03-07 ENCOUNTER — Encounter (HOSPITAL_COMMUNITY): Payer: Self-pay

## 2016-03-07 DIAGNOSIS — R072 Precordial pain: Secondary | ICD-10-CM | POA: Diagnosis not present

## 2016-03-07 DIAGNOSIS — R200 Anesthesia of skin: Secondary | ICD-10-CM | POA: Diagnosis not present

## 2016-03-07 DIAGNOSIS — I340 Nonrheumatic mitral (valve) insufficiency: Secondary | ICD-10-CM | POA: Diagnosis present

## 2016-03-07 DIAGNOSIS — R51 Headache: Secondary | ICD-10-CM | POA: Insufficient documentation

## 2016-03-07 DIAGNOSIS — Z79899 Other long term (current) drug therapy: Secondary | ICD-10-CM | POA: Diagnosis not present

## 2016-03-07 DIAGNOSIS — Z8673 Personal history of transient ischemic attack (TIA), and cerebral infarction without residual deficits: Secondary | ICD-10-CM | POA: Insufficient documentation

## 2016-03-07 DIAGNOSIS — I10 Essential (primary) hypertension: Secondary | ICD-10-CM | POA: Diagnosis not present

## 2016-03-07 DIAGNOSIS — I480 Paroxysmal atrial fibrillation: Secondary | ICD-10-CM | POA: Diagnosis not present

## 2016-03-07 DIAGNOSIS — E876 Hypokalemia: Secondary | ICD-10-CM

## 2016-03-07 DIAGNOSIS — Z7982 Long term (current) use of aspirin: Secondary | ICD-10-CM | POA: Insufficient documentation

## 2016-03-07 DIAGNOSIS — R079 Chest pain, unspecified: Secondary | ICD-10-CM | POA: Diagnosis not present

## 2016-03-07 DIAGNOSIS — G459 Transient cerebral ischemic attack, unspecified: Secondary | ICD-10-CM | POA: Diagnosis present

## 2016-03-07 DIAGNOSIS — R0789 Other chest pain: Secondary | ICD-10-CM | POA: Diagnosis not present

## 2016-03-07 LAB — ECHOCARDIOGRAM COMPLETE
HEIGHTINCHES: 64 in
WEIGHTICAEL: 2643.2 [oz_av]

## 2016-03-07 LAB — I-STAT TROPONIN, ED: Troponin i, poc: 0 ng/mL (ref 0.00–0.08)

## 2016-03-07 LAB — BASIC METABOLIC PANEL
ANION GAP: 10 (ref 5–15)
BUN: 15 mg/dL (ref 6–20)
CO2: 25 mmol/L (ref 22–32)
CREATININE: 1.24 mg/dL — AB (ref 0.44–1.00)
Calcium: 9.4 mg/dL (ref 8.9–10.3)
Chloride: 105 mmol/L (ref 101–111)
GFR calc Af Amer: 51 mL/min — ABNORMAL LOW (ref >60)
GFR, EST NON AFRICAN AMERICAN: 44 mL/min — AB (ref >60)
GLUCOSE: 111 mg/dL — AB (ref 65–99)
Potassium: 2.6 mmol/L — CL (ref 3.5–5.1)
Sodium: 140 mmol/L (ref 135–145)

## 2016-03-07 LAB — EXERCISE TOLERANCE TEST
CSEPEDS: 0 s
CSEPPHR: 117 {beats}/min
Exercise duration (min): 6 min
Percent HR: 76 %
Rest HR: 78 {beats}/min

## 2016-03-07 LAB — CBC
HCT: 33.4 % — ABNORMAL LOW (ref 36.0–46.0)
HEMOGLOBIN: 11.6 g/dL — AB (ref 12.0–15.0)
MCH: 29.7 pg (ref 26.0–34.0)
MCHC: 34.7 g/dL (ref 30.0–36.0)
MCV: 85.6 fL (ref 78.0–100.0)
PLATELETS: 154 10*3/uL (ref 150–400)
RBC: 3.9 MIL/uL (ref 3.87–5.11)
RDW: 13.2 % (ref 11.5–15.5)
WBC: 5.3 10*3/uL (ref 4.0–10.5)

## 2016-03-07 LAB — TROPONIN I: Troponin I: <0.03 ng/mL (ref <0.03)

## 2016-03-07 LAB — D-DIMER, QUANTITATIVE (NOT AT ARMC): D DIMER QUANT: 1.57 ug{FEU}/mL — AB (ref 0.00–0.50)

## 2016-03-07 LAB — POTASSIUM: POTASSIUM: 3.4 mmol/L — AB (ref 3.5–5.1)

## 2016-03-07 MED ORDER — SODIUM CHLORIDE 0.9 % IV SOLN: INTRAVENOUS | Status: DC

## 2016-03-07 MED ORDER — NADOLOL 40 MG PO TABS
40.0000 mg | ORAL_TABLET | Freq: Two times a day (BID) | ORAL | Status: DC
  Administered 2016-03-07: 40 mg via ORAL
  Filled 2016-03-07 (×2): qty 1

## 2016-03-07 MED ORDER — LORAZEPAM 2 MG/ML IJ SOLN
1.0000 mg | Freq: Once | INTRAMUSCULAR | Status: AC
  Administered 2016-03-07: 2 mg via INTRAVENOUS
  Filled 2016-03-07: qty 1

## 2016-03-07 MED ORDER — LISINOPRIL 10 MG PO TABS: 10.0000 mg | ORAL_TABLET | Freq: Every day | ORAL | 0 refills | Status: DC

## 2016-03-07 MED ORDER — SODIUM CHLORIDE 0.9 % IV SOLN
30.0000 meq | Freq: Once | INTRAVENOUS | Status: AC
  Administered 2016-03-07: 30 meq via INTRAVENOUS
  Filled 2016-03-07: qty 15

## 2016-03-07 MED ORDER — FLUTICASONE PROPIONATE 50 MCG/ACT NA SUSP
2.0000 | Freq: Every day | NASAL | Status: DC
  Administered 2016-03-07: 2 via NASAL
  Filled 2016-03-07: qty 16

## 2016-03-07 MED ORDER — ASPIRIN EC 325 MG PO TBEC
325.0000 mg | DELAYED_RELEASE_TABLET | Freq: Every day | ORAL | Status: DC
  Administered 2016-03-07: 325 mg via ORAL
  Filled 2016-03-07: qty 1

## 2016-03-07 MED ORDER — ENOXAPARIN SODIUM 40 MG/0.4ML ~~LOC~~ SOLN
40.0000 mg | SUBCUTANEOUS | Status: DC
  Administered 2016-03-07: 40 mg via SUBCUTANEOUS
  Filled 2016-03-07: qty 0.4

## 2016-03-07 MED ORDER — LISINOPRIL 10 MG PO TABS
10.0000 mg | ORAL_TABLET | Freq: Every day | ORAL | Status: DC
  Administered 2016-03-07: 10 mg via ORAL
  Filled 2016-03-07: qty 1

## 2016-03-07 MED ORDER — PANTOPRAZOLE SODIUM 40 MG PO TBEC
40.0000 mg | DELAYED_RELEASE_TABLET | Freq: Every day | ORAL | Status: DC
  Administered 2016-03-07: 40 mg via ORAL
  Filled 2016-03-07: qty 1

## 2016-03-07 MED ORDER — SODIUM CHLORIDE 0.9 % IV BOLUS (SEPSIS)
1000.0000 mL | Freq: Once | INTRAVENOUS | Status: AC
  Administered 2016-03-07: 1000 mL via INTRAVENOUS

## 2016-03-07 MED ORDER — POTASSIUM CHLORIDE CRYS ER 20 MEQ PO TBCR
40.0000 meq | EXTENDED_RELEASE_TABLET | Freq: Once | ORAL | Status: AC
  Administered 2016-03-07: 40 meq via ORAL
  Filled 2016-03-07: qty 2

## 2016-03-07 MED ORDER — ONDANSETRON HCL 4 MG/2ML IJ SOLN: 4.0000 mg | Freq: Four times a day (QID) | INTRAMUSCULAR | Status: DC | PRN

## 2016-03-07 MED ORDER — NITROGLYCERIN 0.4 MG SL SUBL
0.4000 mg | SUBLINGUAL_TABLET | SUBLINGUAL | Status: DC | PRN
  Administered 2016-03-07: 0.4 mg via SUBLINGUAL
  Filled 2016-03-07: qty 1

## 2016-03-07 MED ORDER — ACETAMINOPHEN 325 MG PO TABS: 650.0000 mg | ORAL_TABLET | ORAL | Status: DC | PRN

## 2016-03-07 MED ORDER — MAGNESIUM OXIDE 400 (241.3 MG) MG PO TABS
400.0000 mg | ORAL_TABLET | Freq: Once | ORAL | Status: AC
  Administered 2016-03-07: 400 mg via ORAL
  Filled 2016-03-07: qty 1

## 2016-03-07 NOTE — ED Notes (Signed)
Dr.Rancour Provider at bedside.

## 2016-03-07 NOTE — ED Notes (Signed)
Anna Marquez, [place] Provider at bedside.

## 2016-03-07 NOTE — ED Provider Notes (Signed)
[facility] DEPT Provider Note   CSN: [id number] Arrival date & time: 03/07/16  0309     History   Chief Complaint Chief Complaint  Patient presents with  . Chest Pain    HPI   Blood pressure 110/79, pulse 65, temperature 97.8 F (36.6 C), temperature source Oral, resp. rate 13, SpO2 95 %.  Anna Marquez is a 66 y.o. female with past medical history significant for hypertension, MVP and arthritis complaining of left-sided chest pain onset yesterday when she was visiting her mother and brother who are hospitalized, this episode was brief and resolved spontaneously, she had another episode which woke her from sleep at approximately 12:30 AM. She states that she felt left arm weakness which has resolved. She states that she had difficulty moving the arm. She is very clear that this is not a pins and needles paresthesia. She denies any change of vision, dysarthria, ataxia, headache. She states that she has a family history of ACS, she's seen a cardiologist for her mitral valve prolapse in the past but doesn't follow regularly.   Past Medical History:  Diagnosis Date  . Adenomatous colon polyp 2003  . Cholelithiasis   . Diverticulosis of colon   . Gastric polyps   . GERD (gastroesophageal reflux disease)   . Helicobacter pylori (H. pylori)   . Hiatal hernia   . Hypertension   . Mitral valve prolapse   . Nephrolithiasis   . Osteoarthritis   . Osteopenia 04/28/2014   T score -1.5 FRAX 8.2%/0.7%  . TGA (transient global amnesia)     Patient Active Problem List   Diagnosis Date Noted  . Chest pain 03/07/2016  . Essential hypertension 01/27/2012  . Migraine without aura 05/20/2008  . ALLERGIC RHINITIS 05/20/2008  . Mitral regurgitation 08/10/2007  . GERD 08/10/2007  . HIATAL HERNIA 08/10/2007  . DIVERTICULOSIS, COLON 08/10/2007  . Osteoarthritis 08/10/2007  . COLONIC POLYPS, ADENOMATOUS, HX OF 08/10/2007    Past Surgical History:  Procedure Laterality Date  .  ANTERIOR AND POSTERIOR REPAIR N/A 06/14/2014   Procedure: ANTERIOR (CYSTOCELE) AND POSTERIOR REPAIR (RECTOCELE);  Surgeon: Anastasio Auerbach, MD;  Location: [facility];  Service: Gynecology;  Laterality: N/A;  . CHOLECYSTECTOMY    . kidney stone removal     x2  . VAGINAL HYSTERECTOMY N/A 06/14/2014   Procedure: HYSTERECTOMY VAGINAL;  Surgeon: Anastasio Auerbach, MD;  Location: [facility];  Service: Gynecology;  Laterality: N/A;    OB History    Gravida Para Term Preterm AB Living   2 2       2    SAB TAB Ectopic Multiple Live Births                   Home Medications    Prior to Admission medications   Medication Sig Start Date End Date Taking? Authorizing Provider  aspirin 325 MG tablet Take 325 mg by mouth daily as needed (afib, chest pains).     Historical Provider, MD  Cholecalciferol (VITAMIN D PO) Take by mouth.    Historical Provider, MD  Fexofenadine HCl (ALLEGRA PO) Take by mouth.    Historical Provider, MD  fluocinonide cream (LIDEX) 0.05 %  12/26/15   Historical Provider, MD  fluticasone (FLONASE) 50 MCG/ACT nasal spray USE ONE SPRAY IN EACH NOSTRIL DAILY. 02/25/13   Dorena Cookey, MD  lisinopril-hydrochlorothiazide (PRINZIDE,ZESTORETIC) 10-12.5 MG tablet Take 1 tablet by mouth daily. 01/23/16   Dorena Cookey, MD  nadolol (CORGARD) 40 MG tablet TAKE  1 TABLET (40 MG TOTAL) BY MOUTH TWICE DAILY. 01/23/16   Dorena Cookey, MD  Omega-3 Fatty Acids (FISH OIL PO) Take by mouth.    Historical Provider, MD  pantoprazole (PROTONIX) 40 MG tablet Take 1 tablet (40 mg total) by mouth daily. 01/23/16   Dorena Cookey, MD  Probiotic Product (PROBIOTIC PO) Take 1 tablet by mouth daily.    Historical Provider, MD    Family History Family History  Problem Relation Age of Onset  . Cancer Father     prostate  . Breast cancer Paternal Aunt 9  . Diabetes Maternal Grandmother   . Heart disease Maternal Grandmother   . Heart disease Maternal Grandfather   . Melanoma Paternal Aunt   . Neuropathy  Mother     Social History Social History  Substance Use Topics  . Smoking status: Never Smoker  . Smokeless tobacco: Never Used  . Alcohol use 0.0 oz/week     Comment: Occas     Allergies   Penicillins; Ibuprofen; and Erythromycin   Review of Systems Review of Systems  10 systems reviewed and found to be negative, except as noted in the HPI.   Physical Exam Updated Vital Signs BP 128/87 (BP Location: Left Arm)   Pulse 70   Temp 97.8 F (36.6 C) (Oral)   Resp 18   SpO2 97%   Physical Exam  Constitutional: She is oriented to person, place, and time. She appears well-developed and well-nourished. No distress.  HENT:  Head: Normocephalic.  Mouth/Throat: Oropharynx is clear and moist.  Eyes: Conjunctivae are normal.  Neck: Normal range of motion. No JVD present. No tracheal deviation present.  Cardiovascular: Normal rate, regular rhythm and intact distal pulses.   Radial pulse equal bilaterally  Pulmonary/Chest: Effort normal and breath sounds normal. No stridor. No respiratory distress. She has no wheezes. She has no rales. She exhibits no tenderness.  Abdominal: Soft. She exhibits no distension and no mass. There is no tenderness. There is no rebound and no guarding.  Musculoskeletal: Normal range of motion. She exhibits no edema or tenderness.  No calf asymmetry, superficial collaterals, palpable cords, edema, Homans sign negative bilaterally.    Neurological: She is alert and oriented to person, place, and time.  Skin: Skin is warm. She is not diaphoretic.  Psychiatric: She has a normal mood and affect.  Nursing note and vitals reviewed.    ED Treatments / Results  Labs (all labs ordered are listed, but only abnormal results are displayed) Labs Reviewed  BASIC METABOLIC PANEL - Abnormal; Notable for the following:       Result Value   Potassium 2.6 (*)    Glucose, Bld 111 (*)    Creatinine, Ser 1.24 (*)    GFR calc non Af Amer 44 (*)    GFR calc Af Amer  51 (*)    All other components within normal limits  CBC - Abnormal; Notable for the following:    Hemoglobin 11.6 (*)    HCT 33.4 (*)    All other components within normal limits  I-STAT TROPOININ, ED    EKG  EKG Interpretation  Date/Time:  Thursday March 07 2016 03:15:54 EST Ventricular Rate:  66 PR Interval:    QRS Duration: 100 QT Interval:  513 QTC Calculation: 538 R Axis:   2 Text Interpretation:  Sinus rhythm Ventricular premature complex Prolonged PR interval Low voltage, precordial leads Borderline repolarization abnormality Prolonged QT interval Baseline wander in lead(s) II III aVF Nonspecific  T wave abnormality new from 2017 but similar to 2013 Confirmed by [facility]  MD, [place] [phone]) on 03/07/2016 3:21:57 AM       Radiology Dg Chest 2 View  Result Date: 03/07/2016 CLINICAL DATA:  Left-sided chest pain. EXAM: CHEST  2 VIEW COMPARISON:  Chest CT 08/20/2015, radiograph 01/16/2012 FINDINGS: Heart size is normal. Mediastinal contours are unchanged. There is a retrocardiac hiatal hernia. The lungs are clear. Pulmonary vasculature is normal. No consolidation, pleural effusion, or pneumothorax. No acute osseous abnormalities are seen. IMPRESSION: No acute abnormality.  Hiatal hernia. Electronically Signed   By: Jeb Levering M.D.   On: 03/07/2016 03:45   Ct Head Wo Contrast  Result Date: 03/07/2016 CLINICAL DATA:  Headache. EXAM: CT HEAD WITHOUT CONTRAST TECHNIQUE: Contiguous axial images were obtained from the base of the skull through the vertex without intravenous contrast. COMPARISON:  Head CT and brain MRI 08/20/2015 FINDINGS: Brain: No evidence of acute infarction, hemorrhage, hydrocephalus, extra-axial collection or mass lesion/mass effect. Vascular: No hyperdense vessel or unexpected calcification. Skull: Normal. Negative for fracture or focal lesion. Sinuses/Orbits: Paranasal sinuses and mastoid air cells are clear. The visualized orbits are unremarkable. Other: None.  IMPRESSION: Unremarkable noncontrast head CT. Electronically Signed   By: Jeb Levering M.D.   On: 03/07/2016 03:48    Procedures Procedures (including critical care time)  Medications Ordered in ED Medications  sodium chloride 0.9 % bolus 1,000 mL (1,000 mLs Intravenous New Bag/Given 03/07/16 0429)  nitroGLYCERIN (NITROSTAT) SL tablet 0.4 mg (0.4 mg Sublingual Given 03/07/16 0429)  potassium chloride SA (K-DUR,KLOR-CON) CR tablet 40 mEq (40 mEq Oral Given 03/07/16 0429)  magnesium oxide (MAG-OX) tablet 400 mg (400 mg Oral Given 03/07/16 0429)     Initial Impression / Assessment and Plan / ED Course  I have reviewed the triage vital signs and the nursing notes.  Pertinent labs & imaging results that were available during my care of the patient were reviewed by me and considered in my medical decision making (see chart for details).     Vitals:   03/07/16 0316 03/07/16 0400 03/07/16 0430 03/07/16 0433  BP: 110/79 113/75 120/77 128/87  Pulse:  74 73 70  Resp: 13 20 16 18   Temp:      TempSrc:      SpO2:  97% 98% 97%    Medications  sodium chloride 0.9 % bolus 1,000 mL (1,000 mLs Intravenous New Bag/Given 03/07/16 0429)  nitroGLYCERIN (NITROSTAT) SL tablet 0.4 mg (0.4 mg Sublingual Given 03/07/16 0429)  potassium chloride SA (K-DUR,KLOR-CON) CR tablet 40 mEq (40 mEq Oral Given 03/07/16 0429)  magnesium oxide (MAG-OX) tablet 400 mg (400 mg Oral Given 03/07/16 0429)    JEANIA MARTUS is 66 y.o. female presenting with Chest pain, woke her from sleep, she's had some stuttering chest pain last few days. EKG with new T-wave inversions, troponin negative. Patient is moderate risk by heart score. I'll need admission for chest pain rule out. She is hypokalemic, she takes lisinopril hydrochlorothiazide. This is likely the cause of her low potassium.  This is a shared visit with the attending physician who personally evaluated the patient and agrees with the care plan.    Final Clinical  Impressions(s) / ED Diagnoses   Final diagnoses:  Chest pain, unspecified type  Hypokalemia    New Prescriptions New Prescriptions   No medications on file     Monico Blitz, PA-C 03/07/16 [place], MD 03/07/16 [phone]

## 2016-03-07 NOTE — Progress Notes (Signed)
  Echocardiogram 2D Echocardiogram has been performed.  Anna Marquez 03/07/2016, 1:17 PM

## 2016-03-07 NOTE — Progress Notes (Signed)
Patient has gone for several test and will not be available for sometime per patients nurse. Per nurse patient has form and will page chaplain when patient is ready to proceed.  Anna Marquez, [place]

## 2016-03-07 NOTE — H&P (Addendum)
History and Physical    Anna Marquez [id number] DOB: 09-23-1950 DOA: 03/07/2016  PCP: Joycelyn Man, MD  Patient coming from: Home.  Chief Complaint: Chest pain.  HPI: Anna Marquez is a 66 y.o. female with history of hypertension, mitral valve prolapse, history of brief episode of atrial fibrillation previously not on anticoagulation, presents to the ER because of chest pain. Patient states her family has been admitted to the hospital yesterday and while visiting the family has had some chest pain on the left side of the chest below her left breast. It was pressure-like and lasted for around 20 minutes and resolved without intervention. Last night around 1 AM patient woke up with retrosternal chest pain radiating to left arm. Patient's left arm also was feeling numb with difficulty moving. EMS was called and patient was given aspirin.   ED Course: In the ER EKG was showing normal sinus rhythm and chest x-ray was unremarkable. Troponin was negative. Chest pain resolved with sublingual nitroglycerin. Patient's numbness of the left upper extremity resolved at the same time. CT of the head was unremarkable.  Review of Systems: As per HPI, rest all negative.   Past Medical History:  Diagnosis Date  . Adenomatous colon polyp 2003  . Cholelithiasis   . Diverticulosis of colon   . Gastric polyps   . GERD (gastroesophageal reflux disease)   . Helicobacter pylori (H. pylori)   . Hiatal hernia   . Hypertension   . Mitral valve prolapse   . Nephrolithiasis   . Osteoarthritis   . Osteopenia 04/28/2014   T score -1.5 FRAX 8.2%/0.7%  . TGA (transient global amnesia)     Past Surgical History:  Procedure Laterality Date  . ANTERIOR AND POSTERIOR REPAIR N/A 06/14/2014   Procedure: ANTERIOR (CYSTOCELE) AND POSTERIOR REPAIR (RECTOCELE);  Surgeon: Anastasio Auerbach, MD;  Location: [facility];  Service: Gynecology;  Laterality: N/A;  . CHOLECYSTECTOMY    . kidney stone removal     x2  .  VAGINAL HYSTERECTOMY N/A 06/14/2014   Procedure: HYSTERECTOMY VAGINAL;  Surgeon: Anastasio Auerbach, MD;  Location: [facility];  Service: Gynecology;  Laterality: N/A;     reports that she has never smoked. She has never used smokeless tobacco. She reports that she drinks alcohol. She reports that she does not use drugs.  Allergies  Allergen Reactions  . Penicillins Hives and Shortness Of Breath    Has patient had a PCN reaction causing immediate rash, facial/tongue/throat swelling, SOB or lightheadedness with hypotension: yes Has patient had a PCN reaction causing severe rash involving mucus membranes or skin necrosis: no Has patient had a PCN reaction that required hospitalization: unknown Has patient had a PCN reaction occurring within the last 10 years: no If all of the above answers are "NO", then may proceed with Cephalosporin use.   . Ibuprofen Other (See Comments)    Pt states allergy to NSAIDS is secondary to ulcers NOT anaphylaxis. Tolerates daily 325 ASA at home  . Erythromycin Nausea And Vomiting    REACTION: Stomach upset    Family History  Problem Relation Age of Onset  . Cancer Father     prostate  . Breast cancer Paternal Aunt 45  . Diabetes Maternal Grandmother   . Heart disease Maternal Grandmother   . Heart disease Maternal Grandfather   . Melanoma Paternal Aunt   . Neuropathy Mother     Prior to Admission medications   Medication Sig Start Date End Date Taking? Authorizing  Provider  aspirin 325 MG tablet Take 325 mg by mouth daily as needed (afib, chest pains).     Historical Provider, MD  Cholecalciferol (VITAMIN D PO) Take by mouth.    Historical Provider, MD  Fexofenadine HCl (ALLEGRA PO) Take by mouth.    Historical Provider, MD  fluocinonide cream (LIDEX) 0.05 %  12/26/15   Historical Provider, MD  fluticasone (FLONASE) 50 MCG/ACT nasal spray USE ONE SPRAY IN EACH NOSTRIL DAILY. 02/25/13   Dorena Cookey, MD  lisinopril-hydrochlorothiazide  (PRINZIDE,ZESTORETIC) 10-12.5 MG tablet Take 1 tablet by mouth daily. 01/23/16   Dorena Cookey, MD  nadolol (CORGARD) 40 MG tablet TAKE 1 TABLET (40 MG TOTAL) BY MOUTH TWICE DAILY. 01/23/16   Dorena Cookey, MD  Omega-3 Fatty Acids (FISH OIL PO) Take by mouth.    Historical Provider, MD  pantoprazole (PROTONIX) 40 MG tablet Take 1 tablet (40 mg total) by mouth daily. 01/23/16   Dorena Cookey, MD  Probiotic Product (PROBIOTIC PO) Take 1 tablet by mouth daily.    Historical Provider, MD    Physical Exam: Vitals:   03/07/16 0430 03/07/16 0433 03/07/16 0500 03/07/16 0539  BP: 120/77 128/87 114/74 112/71  Pulse: 73 70 64 91  Resp: 16 18 11 15   Temp:    97.6 F (36.4 C)  TempSrc:    Oral  SpO2: 98% 97% 98% 100%  Weight:    74.9 kg (165 lb 3.2 oz)  Height:    5\' 4"  (1.626 m)      Constitutional: Moderately built and nourished. Vitals:   03/07/16 0430 03/07/16 0433 03/07/16 0500 03/07/16 0539  BP: 120/77 128/87 114/74 112/71  Pulse: 73 70 64 91  Resp: 16 18 11 15   Temp:    97.6 F (36.4 C)  TempSrc:    Oral  SpO2: 98% 97% 98% 100%  Weight:    74.9 kg (165 lb 3.2 oz)  Height:    5\' 4"  (1.626 m)   Eyes: Anicteric no pallor. ENMT: No discharge from ears eyes nose and mouth. Neck: No mass felt. No neck rigidity. Respiratory: No rhonchi or crepitations. Cardiovascular: S1-S2 heard no murmurs appreciated. Abdomen: Soft nontender bowel sounds present. No guarding or rigidity. Musculoskeletal: No edema. No joint effusion. Skin: No rash. Skin appears warm. Neurologic: Alert awake oriented to time place and person. Moves all extremities 5 x 5. No facial asymmetry tongue is midline. Pupils are equal and reacting to light. Psychiatric: Appears normal. Normal affect.   Labs on Admission: I have personally reviewed following labs and imaging studies  CBC:  Recent Labs Lab 03/07/16 0318  WBC 5.3  HGB 11.6*  HCT 33.4*  MCV 85.6  PLT 123456   Basic Metabolic Panel:  Recent Labs Lab  03/07/16 0318  NA 140  K 2.6*  CL 105  CO2 25  GLUCOSE 111*  BUN 15  CREATININE 1.24*  CALCIUM 9.4   GFR: Estimated Creatinine Clearance: 43.6 mL/min (by C-G formula based on SCr of 1.24 mg/dL (H)). Liver Function Tests: No results for input(s): AST, ALT, ALKPHOS, BILITOT, PROT, ALBUMIN in the last 168 hours. No results for input(s): LIPASE, AMYLASE in the last 168 hours. No results for input(s): AMMONIA in the last 168 hours. Coagulation Profile: No results for input(s): INR, PROTIME in the last 168 hours. Cardiac Enzymes: No results for input(s): CKTOTAL, CKMB, CKMBINDEX, TROPONINI in the last 168 hours. BNP (last 3 results) No results for input(s): PROBNP in the last 8760 hours. HbA1C:  No results for input(s): HGBA1C in the last 72 hours. CBG: No results for input(s): GLUCAP in the last 168 hours. Lipid Profile: No results for input(s): CHOL, HDL, LDLCALC, TRIG, CHOLHDL, LDLDIRECT in the last 72 hours. Thyroid Function Tests: No results for input(s): TSH, T4TOTAL, FREET4, T3FREE, THYROIDAB in the last 72 hours. Anemia Panel: No results for input(s): VITAMINB12, FOLATE, FERRITIN, TIBC, IRON, RETICCTPCT in the last 72 hours. Urine analysis:    Component Value Date/Time   COLORURINE YELLOW 08/20/2015 2348   APPEARANCEUR CLOUDY (A) 08/20/2015 2348   LABSPEC >1.046 (H) 08/20/2015 2348   PHURINE 5.5 08/20/2015 2348   GLUCOSEU NEGATIVE 08/20/2015 2348   GLUCOSEU NEGATIVE 10/16/2009 0756   HGBUR LARGE (A) 08/20/2015 2348   HGBUR large 03/21/2009 1424   BILIRUBINUR neg 12/06/2015 1051   KETONESUR NEGATIVE 08/20/2015 2348   PROTEINUR 1+ 12/06/2015 1051   PROTEINUR NEGATIVE 08/20/2015 2348   UROBILINOGEN 0.2 12/06/2015 1051   UROBILINOGEN 0.2 06/28/2014 1700   NITRITE neg 12/06/2015 1051   NITRITE NEGATIVE 08/20/2015 2348   LEUKOCYTESUR Trace (A) 12/06/2015 1051   Sepsis Labs: @LABRCNTIP (procalcitonin:4,lacticidven:4) )No results found for this or any previous visit  (from the past 240 hour(s)).   Radiological Exams on Admission: Dg Chest 2 View  Result Date: 03/07/2016 CLINICAL DATA:  Left-sided chest pain. EXAM: CHEST  2 VIEW COMPARISON:  Chest CT 08/20/2015, radiograph 01/16/2012 FINDINGS: Heart size is normal. Mediastinal contours are unchanged. There is a retrocardiac hiatal hernia. The lungs are clear. Pulmonary vasculature is normal. No consolidation, pleural effusion, or pneumothorax. No acute osseous abnormalities are seen. IMPRESSION: No acute abnormality.  Hiatal hernia. Electronically Signed   By: Jeb Levering M.D.   On: 03/07/2016 03:45   Ct Head Wo Contrast  Result Date: 03/07/2016 CLINICAL DATA:  Headache. EXAM: CT HEAD WITHOUT CONTRAST TECHNIQUE: Contiguous axial images were obtained from the base of the skull through the vertex without intravenous contrast. COMPARISON:  Head CT and brain MRI 08/20/2015 FINDINGS: Brain: No evidence of acute infarction, hemorrhage, hydrocephalus, extra-axial collection or mass lesion/mass effect. Vascular: No hyperdense vessel or unexpected calcification. Skull: Normal. Negative for fracture or focal lesion. Sinuses/Orbits: Paranasal sinuses and mastoid air cells are clear. The visualized orbits are unremarkable. Other: None. IMPRESSION: Unremarkable noncontrast head CT. Electronically Signed   By: Jeb Levering M.D.   On: 03/07/2016 03:48    EKG: Independently reviewed. Normal sinus rhythm.  Assessment/Plan Principal Problem:   Chest pain Active Problems:   Essential hypertension   Hypokalemia   TIA (transient ischemic attack)    1. Chest pain - will cycle cardiac markers and also check d-dimer. Check 2-D echo. Patient is on when necessary nitroglycerin. Continue with aspirin. Consult cardiology in a.m. 2. Left-sided numbness - discussed with on-call neurologist Dr. Cheral Marker, who advised to get MRI of the brain and if negative no further workup. 3. Hypokalemia - probably secondary to  hydrochlorothiazide. I'm holding off hydrochlorothiazide now and replacing potassium. Recheck potassium after replacement. Check magnesium levels at the same time. 4. Hypertension - on lisinopril. Holding off hydrochlorothiazide secondary to #3. 5. History of mitral valve prolapse - check 2-D echo. 6. History of brief episode of atrial fibrillation many years ago.   DVT prophylaxis: Lovenox. Code Status: Full code.  Family Communication: Discussed with patient.  Disposition Plan: Home.  Consults called: None. Discussed with neurologist.  Admission status: Observation.    [name] MD [facility] Pager [phone].  If 7PM-7AM, please contact night-coverage [email or web address] Password [email or web address]  03/07/2016, 6:01 AM

## 2016-03-07 NOTE — ED Triage Notes (Signed)
Pt arrived via EMS from home; pt has mother and brother here in hospital and pt has been under a lot of stress worrying about family; Pt started to have left side chest pain with radiation to left arm tonight at 1:10am; pt did not report SOB; pt able to speak in full and complete sentences; Pt a&ox 4 on arrival. Pt had 325 mg ASA en route;

## 2016-03-07 NOTE — Discharge Summary (Signed)
Physician Discharge Summary  Anna Marquez [id number] DOB: 06/18/1950 DOA: 03/07/2016  PCP: Joycelyn Man, MD  Admit date: 03/07/2016 Discharge date: 03/07/2016  Admitted From: [facility].  Disposition:  Home.   Recommendations for Outpatient Follow-up:  1. Follow up with PCP in 1-2 weeks 2. Please obtain BMP/CBC in one week     Discharge Condition:stable.  CODE STATUS:full code.  Diet recommendation: Heart Healthy   Brief/Interim Summary: Anna Marquez is a 66 y.o. female with history of hypertension, mitral valve prolapse, history of brief episode of atrial fibrillation previously not on anticoagulation, presents to the ER because of chest pain. Patient states her family has been admitted to the hospital yesterday and while visiting the family has had some chest pain on the left side of the chest below her left breast. It was pressure-like and lasted for around 20 minutes and resolved without intervention. Last night around 1 AM patient woke up with retrosternal chest pain radiating to left arm. Patient's left arm also was feeling numb with difficulty moving. EMS was called and patient was given aspirin. Cardiology consulted, she underwent stress test, which was not remarkable for ischemia. She was cleared for discharge home by cardiology.    Discharge Diagnoses:  Principal Problem:   Chest pain Active Problems:   Mitral regurgitation   Essential hypertension   Hypokalemia   TIA (transient ischemic attack)   Atypical chest pain:  None since admission.  Enzymes negative.  EKG unremarkable.  Cardiology consulted and she underwent stress test, LVEF is normal on echo, and she was cleared for discharge.   Left arm numbness :  MRI ruled out stroke.  No other complaints.    Hypertension:  Well controlled d/c hctz on discharge.   Hypokalemia repleted.     Discharge Instructions  Discharge Instructions    Diet - low sodium heart healthy    Complete by:  As directed     Discharge instructions    Complete by:  As directed    Please follow up with cardiology as recommended.     Allergies as of 03/07/2016      Reactions   Penicillins Hives, Shortness Of Breath   Has patient had a PCN reaction causing immediate rash, facial/tongue/throat swelling, SOB or lightheadedness with hypotension: yes Has patient had a PCN reaction causing severe rash involving mucus membranes or skin necrosis: no Has patient had a PCN reaction that required hospitalization: unknown Has patient had a PCN reaction occurring within the last 10 years: no If all of the above answers are "NO", then may proceed with Cephalosporin use.   Ibuprofen Other (See Comments)   Pt states allergy to NSAIDS is secondary to ulcers NOT anaphylaxis. Tolerates daily 325 ASA at home   Erythromycin Nausea And Vomiting   REACTION: Stomach upset      Medication List    STOP taking these medications   aspirin EC 81 MG tablet   lisinopril-hydrochlorothiazide 10-12.5 MG tablet Commonly known as:  PRINZIDE,ZESTORETIC     TAKE these medications   fluocinonide cream 0.05 % Commonly known as:  LIDEX Apply 1 application topically 2 (two) times daily as needed (rash).   fluticasone 50 MCG/ACT nasal spray Commonly known as:  FLONASE USE ONE SPRAY IN EACH NOSTRIL DAILY. What changed:  how much to take  how to take this  when to take this  reasons to take this  additional instructions   lisinopril 10 MG tablet Commonly known as:  PRINIVIL,ZESTRIL Take 1 tablet (10  mg total) by mouth daily. Start taking on:  03/08/2016   loratadine 10 MG tablet Commonly known as:  CLARITIN Take 10 mg by mouth daily as needed for allergies, rhinitis or itching.   nadolol 40 MG tablet Commonly known as:  CORGARD TAKE 1 TABLET (40 MG TOTAL) BY MOUTH TWICE DAILY. What changed:  how much to take  how to take this  when to take this  additional instructions   pantoprazole 40 MG tablet Commonly known as:   PROTONIX Take 1 tablet (40 mg total) by mouth daily.      Follow-up Information    Richardson Dopp, PA-C Follow up on 03/19/2016.   Specialties:  Cardiology, Physician Assistant Why:  12:15 PM appt Follow up echo results and chest pain. Contact information: [id number] [place] [address] [phone]        Joycelyn Man, MD. Schedule an appointment as soon as possible for a visit in 1 week(s).   Specialty:  Family Medicine Contact information: [address] [place] [address] [phone]          Allergies  Allergen Reactions  . Penicillins Hives and Shortness Of Breath    Has patient had a PCN reaction causing immediate rash, facial/tongue/throat swelling, SOB or lightheadedness with hypotension: yes Has patient had a PCN reaction causing severe rash involving mucus membranes or skin necrosis: no Has patient had a PCN reaction that required hospitalization: unknown Has patient had a PCN reaction occurring within the last 10 years: no If all of the above answers are "NO", then may proceed with Cephalosporin use.   . Ibuprofen Other (See Comments)    Pt states allergy to NSAIDS is secondary to ulcers NOT anaphylaxis. Tolerates daily 325 ASA at home  . Erythromycin Nausea And Vomiting    REACTION: Stomach upset    Consultations:  Cardiology.    Procedures/Studies: Dg Chest 2 View  Result Date: 03/07/2016 CLINICAL DATA:  Left-sided chest pain. EXAM: CHEST  2 VIEW COMPARISON:  Chest CT 08/20/2015, radiograph 01/16/2012 FINDINGS: Heart size is normal. Mediastinal contours are unchanged. There is a retrocardiac hiatal hernia. The lungs are clear. Pulmonary vasculature is normal. No consolidation, pleural effusion, or pneumothorax. No acute osseous abnormalities are seen. IMPRESSION: No acute abnormality.  Hiatal hernia. Electronically Signed   By: Jeb Levering M.D.   On: 03/07/2016 03:45   Ct Head Wo Contrast  Result Date:  03/07/2016 CLINICAL DATA:  Headache. EXAM: CT HEAD WITHOUT CONTRAST TECHNIQUE: Contiguous axial images were obtained from the base of the skull through the vertex without intravenous contrast. COMPARISON:  Head CT and brain MRI 08/20/2015 FINDINGS: Brain: No evidence of acute infarction, hemorrhage, hydrocephalus, extra-axial collection or mass lesion/mass effect. Vascular: No hyperdense vessel or unexpected calcification. Skull: Normal. Negative for fracture or focal lesion. Sinuses/Orbits: Paranasal sinuses and mastoid air cells are clear. The visualized orbits are unremarkable. Other: None. IMPRESSION: Unremarkable noncontrast head CT. Electronically Signed   By: Jeb Levering M.D.   On: 03/07/2016 03:48   Mr Brain Wo Contrast  Result Date: 03/07/2016 CLINICAL DATA:  Chest pain, atrial fibrillation, LEFT arm numbness. EXAM: MRI HEAD WITHOUT CONTRAST TECHNIQUE: Multiplanar, multiecho pulse sequences of the brain and surrounding structures were obtained without intravenous contrast. COMPARISON:  CT head earlier today.  MRI brain 08/21/2015. FINDINGS: Brain: No evidence for acute infarction, hemorrhage, mass lesion, hydrocephalus, or extra-axial fluid. Normal for age cerebral volume. Minor white matter disease, nonspecific, favored to represent chronic microvascular ischemic  change. Vascular: Normal flow voids. Skull and upper cervical spine: Normal marrow signal. Moderate pannus surrounds the dens. Sinuses/Orbits: Negative. Other: None. Compared with prior MR, similar appearance. IMPRESSION: Normal for age brain MR. No acute intracranial findings. Minor white matter disease, stable from 2017. Electronically Signed   By: Staci Righter M.D.   On: 03/07/2016 11:34       Subjective: No new complaints, nochest pain or sob or headache.   Discharge Exam: Vitals:   03/07/16 0700 03/07/16 1300  BP: 135/65 99/60  Pulse: 70 76  Resp: 18 (!) 22  Temp: 97.5 F (36.4 C)    Vitals:   03/07/16 0500 03/07/16  0539 03/07/16 0700 03/07/16 1300  BP: 114/74 112/71 135/65 99/60  Pulse: 64 91 70 76  Resp: 11 15 18  (!) 22  Temp:  97.6 F (36.4 C) 97.5 F (36.4 C)   TempSrc:  Oral Oral   SpO2: 98% 100% 95% 100%  Weight:  74.9 kg (165 lb 3.2 oz)    Height:  5\' 4"  (1.626 m)      General: Pt is alert, awake, not in acute distress Cardiovascular: RRR, S1/S2 +, no rubs, no gallops Respiratory: CTA bilaterally, no wheezing, no rhonchi Abdominal: Soft, NT, ND, bowel sounds + Extremities: no edema, no cyanosis    The results of significant diagnostics from this hospitalization (including imaging, microbiology, ancillary and laboratory) are listed below for reference.     Microbiology: No results found for this or any previous visit (from the past 240 hour(s)).   Labs: BNP (last 3 results)  Recent Labs  08/20/15 2204  BNP [id number]   Basic Metabolic Panel:  Recent Labs Lab 03/07/16 0318 03/07/16 1407  NA 140  --   K 2.6* 3.4*  CL 105  --   CO2 25  --   GLUCOSE 111*  --   BUN 15  --   CREATININE 1.24*  --   CALCIUM 9.4  --    Liver Function Tests: No results for input(s): AST, ALT, ALKPHOS, BILITOT, PROT, ALBUMIN in the last 168 hours. No results for input(s): LIPASE, AMYLASE in the last 168 hours. No results for input(s): AMMONIA in the last 168 hours. CBC:  Recent Labs Lab 03/07/16 0318  WBC 5.3  HGB 11.6*  HCT 33.4*  MCV 85.6  PLT 154   Cardiac Enzymes:  Recent Labs Lab 03/07/16 0822 03/07/16 1407  TROPONINI <0.03 <0.03   BNP: Invalid input(s): POCBNP CBG: No results for input(s): GLUCAP in the last 168 hours. D-Dimer  Recent Labs  03/07/16 0822  DDIMER 1.57*   Hgb A1c No results for input(s): HGBA1C in the last 72 hours. Lipid Profile No results for input(s): CHOL, HDL, LDLCALC, TRIG, CHOLHDL, LDLDIRECT in the last 72 hours. Thyroid function studies No results for input(s): TSH, T4TOTAL, T3FREE, THYROIDAB in the last 72 hours.  Invalid input(s):  FREET3 Anemia work up No results for input(s): VITAMINB12, FOLATE, FERRITIN, TIBC, IRON, RETICCTPCT in the last 72 hours. Urinalysis    Component Value Date/Time   COLORURINE YELLOW 08/20/2015 2348   APPEARANCEUR CLOUDY (A) 08/20/2015 2348   LABSPEC >1.046 (H) 08/20/2015 2348   PHURINE 5.5 08/20/2015 2348   GLUCOSEU NEGATIVE 08/20/2015 2348   GLUCOSEU NEGATIVE 10/16/2009 0756   HGBUR LARGE (A) 08/20/2015 2348   HGBUR large 03/21/2009 1424   BILIRUBINUR neg 12/06/2015 1051   KETONESUR NEGATIVE 08/20/2015 2348   PROTEINUR 1+ 12/06/2015 1051   PROTEINUR NEGATIVE 08/20/2015 2348   UROBILINOGEN 0.2  12/06/2015 1051   UROBILINOGEN 0.2 06/28/2014 1700   NITRITE neg 12/06/2015 1051   NITRITE NEGATIVE 08/20/2015 2348   LEUKOCYTESUR Trace (A) 12/06/2015 1051   Sepsis Labs Invalid input(s): PROCALCITONIN,  WBC,  LACTICIDVEN Microbiology No results found for this or any previous visit (from the past 240 hour(s)).   Time coordinating discharge: Over 30 minutes  SIGNED:   Hosie Poisson, MD  [facility] 03/07/2016, 4:24 PM Pager   If 7PM-7AM, please contact night-coverage [email or web address] Password [email or web address]

## 2016-03-07 NOTE — ED Notes (Signed)
ED Provider at bedside. 

## 2016-03-07 NOTE — Progress Notes (Signed)
Reviewed stress test with Dr Percival Spanish. Inadequete rate but he felt her chest pain was atypical and her LVF is normal by echo. OK to discharge, we will arrange OP f/u.   Kerin Ransom PA-C 03/07/2016 3:08 PM

## 2016-03-07 NOTE — Progress Notes (Signed)
Discharge instructions, RX's and follow up appts explained and provided to patient verbalized understanding. Patient left floor via wheelchair accompanied by staff no c/o pain or shortness of breath at d/c.  Cierrah Dace Lynn, RN  

## 2016-03-07 NOTE — Consult Note (Signed)
Cardiology Consult    Patient ID: Anna Marquez MRN: [id number], DOB/AGE: November 25, 1950   Admit date: 03/07/2016 Date of Consult: 03/07/2016  Primary Physician: Joycelyn Man, MD Primary Cardiologist: Dr. Acie Fredrickson Requesting Provider: Dr. Karleen Hampshire  Reason for Consult: Chest pain  Patient Profile    Anna Marquez is a 66 yo female with a PMH significant for paroxysmal Afb not on anticoagulation, HTN, mitral valve prolapse, asthma, and GERD. She presented to the ED via EMS with left sided chest pain. Cardiology was consulted for ACS workup.  Past Medical History   Past Medical History:  Diagnosis Date  . Adenomatous colon polyp 2003  . Cholelithiasis   . Diverticulosis of colon   . Gastric polyps   . GERD (gastroesophageal reflux disease)   . Helicobacter pylori (H. pylori)   . Hiatal hernia   . Hypertension   . Mitral valve prolapse   . Nephrolithiasis   . Osteoarthritis   . Osteopenia 04/28/2014   T score -1.5 FRAX 8.2%/0.7%  . TGA (transient global amnesia)     Past Surgical History:  Procedure Laterality Date  . ANTERIOR AND POSTERIOR REPAIR N/A 06/14/2014   Procedure: ANTERIOR (CYSTOCELE) AND POSTERIOR REPAIR (RECTOCELE);  Surgeon: Anastasio Auerbach, MD;  Location: [facility];  Service: Gynecology;  Laterality: N/A;  . CHOLECYSTECTOMY    . kidney stone removal     x2  . VAGINAL HYSTERECTOMY N/A 06/14/2014   Procedure: HYSTERECTOMY VAGINAL;  Surgeon: Anastasio Auerbach, MD;  Location: [facility];  Service: Gynecology;  Laterality: N/A;     Allergies  Allergies  Allergen Reactions  . Penicillins Hives and Shortness Of Breath    Has patient had a PCN reaction causing immediate rash, facial/tongue/throat swelling, SOB or lightheadedness with hypotension: yes Has patient had a PCN reaction causing severe rash involving mucus membranes or skin necrosis: no Has patient had a PCN reaction that required hospitalization: unknown Has patient had a PCN reaction occurring within the last  10 years: no If all of the above answers are "NO", then may proceed with Cephalosporin use.   . Ibuprofen Other (See Comments)    Pt states allergy to NSAIDS is secondary to ulcers NOT anaphylaxis. Tolerates daily 325 ASA at home  . Erythromycin Nausea And Vomiting    REACTION: Stomach upset    History of Present Illness    Anna Marquez reports that she was visiting family members (both mother and brother are currently hospitalized) in the hospital yesterday (03/06/16) when she had a new onset of chest pain. The chest pain was located under her left breast, was brief, and spontaneously resolved without intervention. She describes it as sharp, "like a catch."  The pain lasted approximately 20 min before resolving. She went home and tried to rest, but another bout of left-sided chest pain under her breast woke her from sleep around 12:30AM and she called EMS. Left arm weakness accompanied the chest pain; she states she could not move her arm or fingers and her arm felt heavy. She had fallen asleep on her left side. The chest pain and left arm weakness spontaneously resolved and were not accompanied by nausea, vomiting, or palpitations. However, the chest pain returned when EMS was present and did not spontaneously resolve; she was taken to the ED. She states she has been under stress with family members in the hospital and she has not eaten well and has not had rest over the past several days.   On arrival to the ED  she was found to be in NSR, EKG with diffuse T wave inversion, and troponin x 1 negative. She was given SL nitro x 1. She states her pain had largely resolved by the time she arrived to the Ed. She states she is currently pain free, denies dizziness, lightheadedness, nausea, palpitations, and SOB.   She has significant family history for CAD, including her mother (who is currently in heart failure) and thee extended family members who had CAD and died at ages 26.   Inpatient Medications    .  aspirin EC  325 mg Oral Daily  . enoxaparin (LOVENOX) injection  40 mg Subcutaneous Q24H  . fluticasone  2 spray Each Nare Daily  . lisinopril  10 mg Oral Daily  . nadolol  40 mg Oral BID  . pantoprazole  40 mg Oral Daily  . potassium chloride (KCL MULTIRUN) 30 mEq in 265 mL IVPB  30 mEq Intravenous Once     Outpatient Medications    Prior to Admission medications   Medication Sig Start Date End Date Taking? Authorizing Provider  aspirin 325 MG tablet Take 325 mg by mouth daily as needed (afib, chest pains).     Historical Provider, MD  Cholecalciferol (VITAMIN D PO) Take by mouth.    Historical Provider, MD  Fexofenadine HCl (ALLEGRA PO) Take by mouth.    Historical Provider, MD  fluocinonide cream (LIDEX) 0.05 %  12/26/15   Historical Provider, MD  fluticasone (FLONASE) 50 MCG/ACT nasal spray USE ONE SPRAY IN EACH NOSTRIL DAILY. 02/25/13   Dorena Cookey, MD  lisinopril-hydrochlorothiazide (PRINZIDE,ZESTORETIC) 10-12.5 MG tablet Take 1 tablet by mouth daily. 01/23/16   Dorena Cookey, MD  nadolol (CORGARD) 40 MG tablet TAKE 1 TABLET (40 MG TOTAL) BY MOUTH TWICE DAILY. 01/23/16   Dorena Cookey, MD  Omega-3 Fatty Acids (FISH OIL PO) Take by mouth.    Historical Provider, MD  pantoprazole (PROTONIX) 40 MG tablet Take 1 tablet (40 mg total) by mouth daily. 01/23/16   Dorena Cookey, MD  Probiotic Product (PROBIOTIC PO) Take 1 tablet by mouth daily.    Historical Provider, MD     Family History     Family History  Problem Relation Age of Onset  . Cancer Father     prostate  . Breast cancer Paternal Aunt 39  . Diabetes Maternal Grandmother   . Heart disease Maternal Grandmother   . Heart disease Maternal Grandfather   . Melanoma Paternal Aunt   . Neuropathy Mother   . Congestive Heart Failure Mother     Social History    Social History   Social History  . Marital status: Divorced    Spouse name: N/A  . Number of children: N/A  . Years of education: N/A   Occupational  History  . Not on file.   Social History Main Topics  . Smoking status: Never Smoker  . Smokeless tobacco: Never Used  . Alcohol use 0.0 oz/week     Comment: Occas  . Drug use: No  . Sexual activity: Yes    Birth control/ protection: Post-menopausal     Comment: 1st intercourse 66 yo-Fewer than 5 partners   Other Topics Concern  . Not on file   Social History Narrative  . No narrative on file     Review of Systems    General:  No chills, fever, night sweats or weight changes.  Cardiovascular:  No chest pain, dyspnea on exertion, edema, orthopnea, palpitations, paroxysmal nocturnal  dyspnea. Dermatological: No rash, lesions/masses Respiratory: No cough, dyspnea Urologic: No hematuria, dysuria Abdominal:   No nausea, vomiting, diarrhea, bright red blood per rectum, melena, or hematemesis Neurologic:  No visual changes, changes in mental status. All other systems reviewed and are otherwise negative except as noted above.  Physical Exam    Blood pressure 135/65, pulse 70, temperature 97.5 F (36.4 C), temperature source Oral, resp. rate 18, height 5\' 4"  (1.626 m), weight 165 lb 3.2 oz (74.9 kg), SpO2 95 %.  General: Pleasant, NAD Psych: Normal affect. Neuro: Alert and oriented X 3. Moves all extremities spontaneously. HEENT: Normal  Neck: Supple without bruits or JVD. Lungs:  Resp regular and unlabored, CTA. Heart: RRR no s3, s4, or murmurs. Abdomen: Soft, non-tender, non-distended, BS + x 4.  Extremities: No clubbing, cyanosis or edema. DP/PT/Radials 2+ and equal bilaterally.  Labs    Troponin [facility] of Care Test)  Recent Labs  03/07/16 0318  TROPIPOC 0.00   No results for input(s): CKTOTAL, CKMB, TROPONINI in the last 72 hours. Lab Results  Component Value Date   WBC 5.3 03/07/2016   HGB 11.6 (L) 03/07/2016   HCT 33.4 (L) 03/07/2016   MCV 85.6 03/07/2016   PLT 154 03/07/2016     Recent Labs Lab 03/07/16 0318  NA 140  K 2.6*  CL 105  CO2 25  BUN 15    CREATININE 1.24*  CALCIUM 9.4  GLUCOSE 111*   Lab Results  Component Value Date   CHOL 176 08/21/2015   HDL 46 08/21/2015   LDLCALC 95 08/21/2015   TRIG 175 (H) 08/21/2015   No results found for: [facility]   Radiology Studies    Dg Chest 2 View  Result Date: 03/07/2016 CLINICAL DATA:  Left-sided chest pain. EXAM: CHEST  2 VIEW COMPARISON:  Chest CT 08/20/2015, radiograph 01/16/2012 FINDINGS: Heart size is normal. Mediastinal contours are unchanged. There is a retrocardiac hiatal hernia. The lungs are clear. Pulmonary vasculature is normal. No consolidation, pleural effusion, or pneumothorax. No acute osseous abnormalities are seen. IMPRESSION: No acute abnormality.  Hiatal hernia. Electronically Signed   By: Jeb Levering M.D.   On: 03/07/2016 03:45   Ct Head Wo Contrast  Result Date: 03/07/2016 CLINICAL DATA:  Headache. EXAM: CT HEAD WITHOUT CONTRAST TECHNIQUE: Contiguous axial images were obtained from the base of the skull through the vertex without intravenous contrast. COMPARISON:  Head CT and brain MRI 08/20/2015 FINDINGS: Brain: No evidence of acute infarction, hemorrhage, hydrocephalus, extra-axial collection or mass lesion/mass effect. Vascular: No hyperdense vessel or unexpected calcification. Skull: Normal. Negative for fracture or focal lesion. Sinuses/Orbits: Paranasal sinuses and mastoid air cells are clear. The visualized orbits are unremarkable. Other: None. IMPRESSION: Unremarkable noncontrast head CT. Electronically Signed   By: Jeb Levering M.D.   On: 03/07/2016 03:48    ECG & Cardiac Imaging    EKG 03/07/16: sinus rhythm, PVC, possible prolonged PR interval, diffuse T wave inversions   Echocardiogram 03/07/16: pending   Echocardiogram 01/16/12: Study Conclusions - Left ventricle: The cavity size was normal. Wall thickness was increased in a pattern of mild LVH. Systolic function was normal. The estimated ejection fraction was in the range of 55% to 60%.  Wall motion was normal; there were no regional wall motion abnormalities. Doppler parameters are consistent with abnormal left ventricular relaxation (grade 1 diastolic dysfunction). - Mitral valve: Systolic bowing without prolapse. Moderate regurgitation. - Left atrium: The atrium was mildly dilated.   Assessment & Plan  1. Chest pain - pt states the pain occurred three times since yesterday afternoon; the pain occurs at rest; she denies chest pain with activity, including walking on the treadmill. - she has a hx of HTN and family history of heart disease, but no DM, HLD, and she is a nonsmoker - EKG on admission and telemetry review with diffuse T wave inversions that are new compared to previous EKG - initial troponin was negative - CXR WNL - continue to cycle troponin - echocardiogram pending - given her atypical chest pain that resolved with SL nitro and strong family history of heart disease, would recommend a stress test while inpatient, consider treadmill exercise stress test vs myoview; will discuss with MD further evaluation following echo - primary team to check brain MRI for left sided arm weakness/numbness - keep K > 4; keep Mg > 2   2. Paroxysmal atrial fibrillation - EKG on admission in NSR, possible prolonged PR interval - not on AC  - continue home med nadolol   3. HTN - BP: 110/64 - 135/87 - continue home meds lisinopril-HCTZ - her BP is controlled at home on her home meds   4. Mitral valve prolapse/regurgitation - echo to eval     Signed, Anna Lin Duke, PA-C 03/07/2016, 8:34 AM   History and all data above reviewed.  Patient examined.  I agree with the findings as above.  The patient exam reveals COR:RRR, no murmur  ,  Lungs: Clear  ,  Abd: Positive bowel sounds, no rebound no guarding, Ext No edema  .  All available labs, radiology testing, previous records reviewed. Agree with documented assessment and plan.  Hypokalemia:  She did  receive 70 meq of potassium replacement so far but she will likely need more.    Atrial fib:  I reviewed the records from Dr. Acie Fredrickson in the office from 2014.  He did not think that she needed long term anticoagulation and I don't see document fib since that time.  She does not need to be on ASA according to guidelines for stroke prophylaxis in this situatino.  MVP:  She did not have prolapse on the echo in 2014 but had moderate MR.  Echo repeat pending.  She cancelled the echo in 2017.   Chest pain:  Atypical but some risk factors.  Plan POET (Plain Old Exercise Treadmill) in patient.    Minus Breeding  9:38 AM  03/07/2016

## 2016-03-07 NOTE — ED Provider Notes (Signed)
By signing my name below, I, Dyke Brackett, attest that this documentation has been prepared under the direction and in the presence of Ezequiel Essex, MD . Electronically Signed: Dyke Brackett, Scribe. 03/07/2016. 3:59 AM.   Anna Marquez is a 66 y.o. female with a history of asthma, HTN, and MVP complaining of left sided chest pain onset three hours ago. She describes her chest pain as pressure and states it is unchanged with deep inspiration. She reports associated left arm numbness and heaviness, and states this was not a pins and needles sensation. Per pt, she was unable to move her left arm for several minutes until this resolved. She was given 4 ASA en route to ED with relief of pain. Pt states she has been visiting her mother and brother in the hospital for two weeks and has not been eating or sleeping regularly. No hx of DM. She is a nonsmoker. Denies SOB, nausea, vomiting, diarrhea, diaphoresis, back pain, abdominal pain,or  leg swelling.   Chest wall nontender, equal radial pulses, equal grip strength, 5/5 strength bilateral upper extremities.  EKG with nonspecific T wave changes. Normal neuro exam. Doubt aortic dissection, doubt PE. Heart score 5. Plan for ACS rule out. Replace potassium.   I personally performed the services described in this documentation, which was scribed in my presence. The recorded information has been reviewed and is accurate.     Ezequiel Essex, MD 03/07/16 [phone]

## 2016-03-08 ENCOUNTER — Telehealth: Payer: Self-pay | Admitting: Family Medicine

## 2016-03-08 NOTE — Telephone Encounter (Signed)
Pt got out of the hospital on 03/07/16 and was trying to see Dr. Sherren Mocha within that one week time period and Dr. Sherren Mocha does not have anything available she was wondering if it is okay to see someone else for the post hospital visit?

## 2016-03-11 ENCOUNTER — Telehealth: Payer: Self-pay

## 2016-03-11 NOTE — Telephone Encounter (Signed)
Dr. Sherren Mocha stated he will call patient.  Information given to him.

## 2016-03-11 NOTE — Telephone Encounter (Signed)
D/C 03/07/16 To: home  Spoke with pt and she reports that she is doing well. She denies any further chest pain or cardiac symptoms. She does feel much improved after discharge. She has some questions about testing that was done while she was admitted, advised her that Dr Sherren Mocha can likely review these with her when she comes in for OV.   Appt scheduled with Dr Sherren Mocha 03/14/16 @ 11am. Pt aware.    Transition Care Management Follow-up Telephone Call  How have you been since you were released from the hospital? Very good   Do you understand why you were in the hospital? yes   Do you understand the discharge instrcutions? yes  Items Reviewed:  Medications reviewed: yes  Allergies reviewed: yes  Dietary changes reviewed: yes  Referrals reviewed: yes   Functional Questionnaire:   Activities of Daily Living (ADLs):   She states they are independent in the following: ambulation, bathing and hygiene, feeding, continence, grooming, toileting and dressing States they require assistance with the following: none   Any transportation issues/concerns?: no   Any patient concerns? no   Confirmed importance and date/time of follow-up visits scheduled: yes   Confirmed with patient if condition begins to worsen call PCP or go to the ER.  Patient was given the Call-a-Nurse line [phone]: yes

## 2016-03-13 ENCOUNTER — Ambulatory Visit (INDEPENDENT_AMBULATORY_CARE_PROVIDER_SITE_OTHER): Payer: PPO | Admitting: Family Medicine

## 2016-03-13 ENCOUNTER — Encounter: Payer: Self-pay | Admitting: Family Medicine

## 2016-03-13 VITALS — BP 136/88 | Temp 98.2°F | Ht 64.0 in | Wt 168.0 lb

## 2016-03-13 DIAGNOSIS — K449 Diaphragmatic hernia without obstruction or gangrene: Secondary | ICD-10-CM | POA: Diagnosis not present

## 2016-03-13 NOTE — Progress Notes (Signed)
Pre visit review using our clinic review tool, if applicable. No additional management support is needed unless otherwise documented below in the visit note. 

## 2016-03-13 NOTE — Progress Notes (Signed)
Anna Marquez is a 66 year old single female nonsmoker who comes in today following a hospital evaluation on March 1 for acute chest pain  She has a history of viral hernia with reflux esophagitis and is on Protonix 40 mg daily. During the month of February her brother developed the epidural abscess and was in and out of the hospital her mother then developed congestive heart failure and was transferred to a nursing home. During this time she was sleeping in her mother's house eating late and on the evening of March 1 went to bed around 1 AM. She woke up with severe chest pain going down her left arm. She says it felt like her reflux esophagitis but was on the other side of her chest. She called 911 was taken to the hospital. Diagnostic evaluations were negative cardiac wise except for some MVP which she's had before. Chest x-ray showed a significant retrocardiac hiatal hernia.  Review of systems otherwise negative  BP 136/88 (BP Location: Left Arm, Cuff Size: Normal)   Temp 98.2 F (36.8 C) (Oral)   Ht 5\' 4"  (1.626 m)   Wt 168 lb (76.2 kg)   SpO2 97%   BMI 28.84 kg/m  General she is a well-developed well-nourished female no acute distress asymptomatic.  #1 acute chest pain secondary to reflux esophagitis from underlying hiatal hernia,,,,,,,,,,,, outlined treatment program,,,,,,,,, nothing by mouth for 4 hours prior to bedtime,,,,, no aspirin avoid caffeine peppermint,,,,,,,,, Protonix 40 mg twice daily since she had a recent flare for 4 weeks then go back to 40 mg daily.  Return when necessary

## 2016-03-13 NOTE — Patient Instructions (Signed)
Protonix twice daily for 2 weeks then go back to once daily  Nothing to eat or drink for 4 hours prior to bedtime  Sleep on 2 pillows  Avoid nicotine and alcohol caffeine peppermint and stress  Return when necessary

## 2016-03-19 ENCOUNTER — Ambulatory Visit: Payer: PPO | Admitting: Physician Assistant

## 2016-04-03 ENCOUNTER — Encounter: Payer: Self-pay | Admitting: Cardiovascular Disease

## 2016-04-03 ENCOUNTER — Ambulatory Visit (INDEPENDENT_AMBULATORY_CARE_PROVIDER_SITE_OTHER): Payer: PPO | Admitting: Cardiovascular Disease

## 2016-04-03 ENCOUNTER — Ambulatory Visit: Payer: PPO | Admitting: Physician Assistant

## 2016-04-03 ENCOUNTER — Encounter (INDEPENDENT_AMBULATORY_CARE_PROVIDER_SITE_OTHER): Payer: Self-pay

## 2016-04-03 VITALS — BP 164/110 | HR 77 | Ht 64.0 in | Wt 167.0 lb

## 2016-04-03 DIAGNOSIS — I10 Essential (primary) hypertension: Secondary | ICD-10-CM | POA: Diagnosis not present

## 2016-04-03 NOTE — Patient Instructions (Signed)
Medication Instructions:  Your physician recommends that you continue on your current medications as directed. Please refer to the Current Medication list given to you today.   Labwork: None Ordered   Testing/Procedures: None Ordered   Follow-Up: Your physician wants you to follow-up in: 1 year with Dr. Nahser.  You will receive a reminder letter in the mail two months in advance. If you don't receive a letter, please call our office to schedule the follow-up appointment.   If you need a refill on your cardiac medications before your next appointment, please call your pharmacy.   Thank you for choosing [facility]! Sharmeka Palmisano, RN [phone]    

## 2016-04-03 NOTE — Progress Notes (Signed)
Anna Marquez Date of Birth  [birth year]       [facility] [address], [address], [place], [place]  [address]   [place], [place]  [address] [phone]     [phone]   Fax  [phone]    Fax [phone]  Problem List: 1. Paroxysmal atrial fibrillation 2. Hypertension 3.  Mitral Valve prolapse 4. Asthma 5, GERD   Notes from 2014: Anna Marquez is a 66 year old female with a history of mitral valve prolapse and hypertension in the past. She recently presented to the hospital with palpitations.  She woke up with palpitations.  She drinks alcohol only very rarely.  She was found to have atrial fibrillation with a rapid ventricular response. She taken an extra Corgard prior to coming to the emergency room. She converted to sinus rhythm while in the emergency room. Followup EKG revealed normal sinus rhythm.  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 She tries to exercise on a daily basis.  She has had occasional palpitations that sound like PVCs .    April 27, 2012:  Anna Marquez is doing well.  No palpitations.  Exercising regularly.  No complaints  April 03, 2016:  Anna Marquez was recently admitted to the hospital with chest pain. She has a history of paroxysmal atrial fibrillation, hypertension, mitral valve prolapse, asthma, and gastroesophageal reflux disease.  She had a treadmill status which was negative for ischemia. She has normal left ventricular systolic function by echo.  She walks regularly .  Has had some CP that she thinks is reflux.   Is taking PPI   Is retired from [employer] Prescriptions on File Prior to Visit  Medication Sig Dispense Refill  . Cholecalciferol (VITAMIN D3) 2000 units TABS Take 2,000 Units by mouth daily.    . fluocinonide cream (LIDEX) 0.27 % Apply 1 application topically 2 (two) times daily as needed (rash).   1  . fluticasone  (FLONASE) 50 MCG/ACT nasal spray USE ONE SPRAY IN EACH NOSTRIL DAILY. (Patient taking differently: Place 1 spray into both nostrils daily as needed for allergies or rhinitis. USE ONE SPRAY IN EACH NOSTRIL DAILY) 32 g 6  . lisinopril (PRINIVIL,ZESTRIL) 10 MG tablet Take 1 tablet (10 mg total) by mouth daily. 30 tablet 0  . loratadine (CLARITIN) 10 MG tablet Take 10 mg by mouth daily as needed for allergies, rhinitis or itching.    . nadolol (CORGARD) 40 MG tablet TAKE 1 TABLET (40 MG TOTAL) BY MOUTH TWICE DAILY. (Patient taking differently: Take 40 mg by mouth 2 (two) times daily. ) 180 tablet 4  . Omega-3 Fatty Acids (FISH OIL PO) Take 3,000 mg by mouth daily.    . pantoprazole (PROTONIX) 40 MG tablet Take 1 tablet (40 mg total) by mouth daily. 90 tablet 4   No current facility-administered medications on file prior to visit.     Allergies  Allergen Reactions  . Penicillins Hives and Shortness Of Breath    Has patient had a PCN reaction causing immediate rash, facial/tongue/throat swelling, SOB or lightheadedness with hypotension: yes Has patient had a PCN reaction causing severe rash involving mucus membranes or skin necrosis: no Has patient had a PCN reaction that required hospitalization: unknown Has patient had a PCN reaction occurring within the last 10 years: no If all of the above answers are "NO", then may proceed with Cephalosporin  use.   . Ibuprofen Other (See Comments)    Pt states allergy to NSAIDS is secondary to ulcers NOT anaphylaxis. Tolerates daily 325 ASA at home  . Erythromycin Nausea And Vomiting    REACTION: Stomach upset    Past Medical History:  Diagnosis Date  . Adenomatous colon polyp 2003  . Cholelithiasis   . Diverticulosis of colon   . Gastric polyps   . GERD (gastroesophageal reflux disease)   . Helicobacter pylori (H. pylori)   . Hiatal hernia   . Hypertension   . Mitral valve prolapse   . Nephrolithiasis   . Osteoarthritis   . Osteopenia 04/28/2014    T score -1.5 FRAX 8.2%/0.7%  . TGA (transient global amnesia)     Past Surgical History:  Procedure Laterality Date  . ANTERIOR AND POSTERIOR REPAIR N/A 06/14/2014   Procedure: ANTERIOR (CYSTOCELE) AND POSTERIOR REPAIR (RECTOCELE);  Surgeon: Anastasio Auerbach, MD;  Location: [facility];  Service: Gynecology;  Laterality: N/A;  . CHOLECYSTECTOMY    . kidney stone removal     x2  . VAGINAL HYSTERECTOMY N/A 06/14/2014   Procedure: HYSTERECTOMY VAGINAL;  Surgeon: Anastasio Auerbach, MD;  Location: [facility];  Service: Gynecology;  Laterality: N/A;    History  Smoking Status  . Never Smoker  Smokeless Tobacco  . Never Used    History  Alcohol Use  . 0.0 oz/week    Comment: Occas    Family History  Problem Relation Age of Onset  . Cancer Father     prostate  . Breast cancer Paternal Aunt 76  . Diabetes Maternal Grandmother   . Heart disease Maternal Grandmother   . Heart disease Maternal Grandfather   . Melanoma Paternal Aunt   . Neuropathy Mother   . Congestive Heart Failure Mother     Reviw of Systems:  Reviewed in the HPI.  All other systems are negative.  Physical Exam: Blood pressure (!) 164/110, pulse 77, height 5\' 4"  (1.626 m), weight 167 lb (75.8 kg), SpO2 94 %. General: Well developed, well nourished, in no acute distress.  Head: Normocephalic, atraumatic, sclera non-icteric, mucus membranes are moist,   Neck: Supple. Carotids are 2 + without bruits. No JVD   Lungs: Clear   Heart: RR, soft systolic murmur  Abdomen: Soft, non-tender, non-distended with normal bowel sounds.  Msk:  Strength and tone are normal   Extremities: No clubbing or cyanosis. No edema.  Distal pedal pulses are 2+ and equal    Neuro: CN II - XII intact.  Alert and oriented X 3.   Psych:  Normal   ECG:    Assessment / Plan:   1. Chest pain :  Very atypical .   She had a normal treadmill  test while in the hospital.  2. History of mitral  Regurgitation  3. Essential HTN: BP is  elevated.   This may be due to the  stress from coming to a new office today. She also has not been sleeping well because of family issues. She had very marked hypokalemia with the HCTZ. If she needs another diuretic I would try her on Aldactone.  She will monitor her blood pressure and keep a record. Dr. Sherren Mocha has been managing this. I would be happy to provide some input if needed.    Mertie Moores, MD  04/03/2016 3:56 PM    [facility],  [place], [place]  [address] Pager [phone] Phone: [phone])  [phone]; Fax: [phone]    

## 2016-04-10 ENCOUNTER — Other Ambulatory Visit: Payer: Self-pay | Admitting: Family Medicine

## 2016-04-10 MED ORDER — LISINOPRIL 10 MG PO TABS: 10.0000 mg | ORAL_TABLET | Freq: Every day | ORAL | 2 refills | Status: DC

## 2016-04-10 NOTE — Telephone Encounter (Signed)
Sent to the pharmacy by e-scribe. 

## 2016-05-03 ENCOUNTER — Ambulatory Visit (INDEPENDENT_AMBULATORY_CARE_PROVIDER_SITE_OTHER): Payer: PPO | Admitting: Internal Medicine

## 2016-05-03 ENCOUNTER — Encounter: Payer: Self-pay | Admitting: Internal Medicine

## 2016-05-03 VITALS — BP 130/80 | HR 82 | Temp 98.4°F | Ht 64.0 in | Wt 163.4 lb

## 2016-05-03 DIAGNOSIS — J988 Other specified respiratory disorders: Secondary | ICD-10-CM

## 2016-05-03 DIAGNOSIS — J45901 Unspecified asthma with (acute) exacerbation: Secondary | ICD-10-CM

## 2016-05-03 DIAGNOSIS — R062 Wheezing: Secondary | ICD-10-CM

## 2016-05-03 MED ORDER — IPRATROPIUM-ALBUTEROL 0.5-2.5 (3) MG/3ML IN SOLN
3.0000 mL | Freq: Once | RESPIRATORY_TRACT | Status: AC
Start: 2016-05-03 — End: 2016-05-03
  Administered 2016-05-03: 3 mL via RESPIRATORY_TRACT

## 2016-05-03 MED ORDER — HYDROCODONE-HOMATROPINE 5-1.5 MG/5ML PO SYRP: 5.0000 mL | ORAL_SOLUTION | Freq: Three times a day (TID) | ORAL | 0 refills | Status: DC | PRN

## 2016-05-03 MED ORDER — PREDNISONE 20 MG PO TABS: 20.0000 mg | ORAL_TABLET | Freq: Two times a day (BID) | ORAL | 0 refills | Status: DC

## 2016-05-03 MED ORDER — ALBUTEROL SULFATE HFA 108 (90 BASE) MCG/ACT IN AERS: 1.0000 | INHALATION_SPRAY | Freq: Four times a day (QID) | RESPIRATORY_TRACT | 2 refills | Status: DC | PRN

## 2016-05-03 NOTE — Patient Instructions (Signed)
Can use an updated inhaler up to every 4-6 hours as needed for wheezing. If it is not improving your wheezing you can add prednisone 3-5 days as discussed. I believe you have a viral respiratory infection that is flaring up your asthma. Cough medicine is for comfort caution is it is a narcotic cough medicine. If you get relapsing fever not improving after the weekend contact us for advice for follow-up. Your cough may last weeks otherwise Continues Delsym or DM in the day.

## 2016-05-03 NOTE — Progress Notes (Signed)
Chief Complaint  Patient presents with  . Sore Throat    started monday   . Nasal Congestion    wheezing   . Fever  . Cough    dry cough     HPI: [name] 66 y.o.  sda  PCP NA  History of underlying asthma. Began with a sore throat 5 days ago and then developed head congestion sinus pressure and in his dry hacking cough with a little bit of wheezing. She's used her inhaler 3 different times with some mild to moderate relief. Had a fever of 101 at one point but none last night. She has a history of asthma and is used her inhaler but inspection it was expired in 2016. 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  ROS: See pertinent positives and negatives per HPI. tadking otc sinus meds  No ets   Past Medical History:  Diagnosis Date  . Adenomatous colon polyp 2003  . Cholelithiasis   . Diverticulosis of colon   . Gastric polyps   . GERD (gastroesophageal reflux disease)   . Helicobacter pylori (H. pylori)   . Hiatal hernia   . Hypertension   . Mitral valve prolapse   . Nephrolithiasis   . Osteoarthritis   . Osteopenia 04/28/2014   T score -1.5 FRAX 8.2%/0.7%  . TGA (transient global amnesia)     Family History  Problem Relation Age of Onset  . Cancer Father     prostate  . Breast cancer Paternal Aunt 38  . Diabetes Maternal Grandmother   . Heart disease Maternal Grandmother   . Heart disease Maternal Grandfather   . Melanoma Paternal Aunt   . Neuropathy Mother   . Congestive Heart Failure Mother     Social History   Social History  . Marital status: Divorced    Spouse name: N/A  . Number of children: N/A  . Years of education: N/A   Social History Main Topics  . Smoking status: Never Smoker  . Smokeless tobacco: Never Used  . Alcohol use 0.0 oz/week     Comment: Occas  . Drug use: No  . Sexual activity: Yes    Birth control/ protection: Post-menopausal     Comment: 1st intercourse 66 yo-Fewer than 5 partners   Other Topics Concern  . None    Social History Narrative  . None    Outpatient Medications Prior to Visit  Medication Sig Dispense Refill  . Cholecalciferol (VITAMIN D3) 2000 units TABS Take 2,000 Units by mouth daily.    . fluocinonide cream (LIDEX) 9.56 % Apply 1 application topically 2 (two) times daily as needed (rash).   1  . fluticasone (FLONASE) 50 MCG/ACT nasal spray USE ONE SPRAY IN EACH NOSTRIL DAILY. (Patient taking differently: Place 1 spray into both nostrils daily as needed for allergies or rhinitis. USE ONE SPRAY IN EACH NOSTRIL DAILY) 32 g 6  . lisinopril (PRINIVIL,ZESTRIL) 10 MG tablet Take 1 tablet (10 mg total) by mouth daily. 90 tablet 2  . loratadine (CLARITIN) 10 MG tablet Take 10 mg by mouth daily as needed for allergies, rhinitis or itching.    . nadolol (CORGARD) 40 MG tablet TAKE 1 TABLET (40 MG TOTAL) BY MOUTH TWICE DAILY. (Patient taking differently: Take 40 mg by mouth 2 (two) times daily. ) 180 tablet 4  . Omega-3 Fatty Acids (FISH OIL PO) Take 3,000 mg by mouth daily.    . pantoprazole (PROTONIX) 40 MG tablet Take 1 tablet (40 mg total)  by mouth daily. 90 tablet 4   No facility-administered medications prior to visit.      EXAM:  BP 130/80 (BP Location: Left Arm, Patient Position: Sitting, Cuff Size: Normal)   Pulse 82   Temp 98.4 F (36.9 C) (Oral)   Ht 5\' 4"  (1.626 m)   Wt 163 lb 6.4 oz (74.1 kg)   SpO2 97%   BMI 28.05 kg/m   Body mass index is 28.05 kg/m. WDWN in NAD  quiet respirations; congested  somewhat hoarse. Non toxic . Dry tight cough   Better after nebulizer   HEENT: Normocephalic ;atraumatic , Eyes;  PERRL, EOMs  Full, lids and conjunctiva clear,,Ears: no deformities, canals nl, TM landmarks normal, Nose: no deformity or discharge but congested;face minimally tender Mouth : OP clear without lesion or edema . Neck: Supple without adenopathy or masses or bruits Chest:    bs   End exp musical wheezes   Resolved with inc bs after duoneb.  CV:  S1-S2 no gallops or  murmurs peripheral perfusion is normal Skin :nl perfusion and no acute rashes   ASSESSMENT AND PLAN:  Discussed the following assessment and plan:  Wheezing-associated respiratory infection (WARI) - Plan: ipratropium-albuterol (DUONEB) 0.5-2.5 (3) MG/3ML nebulizer solution 3 mL  Exacerbation of asthma, unspecified asthma severity, unspecified whether persistent  Wheezing - Plan: ipratropium-albuterol (DUONEB) 0.5-2.5 (3) MG/3ML nebulizer solution 3 mL Discussed symptoms and plan renew and on expired albuterol as she seems to respond to the nebulizer quite well. Asks about cough medicine we discussed this will give her which she has had before with caution hydrocodone. If the wheezing is relapsing not improving over the weekend she can begin the prednisone course. However seek emergent care with alarm symptoms. Expectant management of the cough may last a week or 2 and she can follow-up if needed. Reviewed signs of pneumonia. -Patient advised to return or notify health care team  if symptoms worsen ,persist or new concerns arise.  Patient Instructions  Can use an updated inhaler up to every 4-6 hours as needed for wheezing. If it is not improving your wheezing you can add prednisone 3-5 days as discussed. I believe you have a viral respiratory infection that is flaring up your asthma. Cough medicine is for comfort caution is it is a narcotic cough medicine. If you get relapsing fever not improving after the weekend contact us for advice for follow-up. Your cough may last weeks otherwise Continues Delsym or DM in the day.    Standley Brooking. Jassmine Vandruff M.D.

## 2016-05-06 ENCOUNTER — Encounter: Payer: Self-pay | Admitting: Family Medicine

## 2016-05-06 ENCOUNTER — Telehealth: Payer: Self-pay | Admitting: Family Medicine

## 2016-05-06 ENCOUNTER — Ambulatory Visit (INDEPENDENT_AMBULATORY_CARE_PROVIDER_SITE_OTHER): Payer: PPO | Admitting: Family Medicine

## 2016-05-06 VITALS — BP 160/90 | HR 75 | Temp 98.8°F | Wt 164.8 lb

## 2016-05-06 DIAGNOSIS — R062 Wheezing: Secondary | ICD-10-CM

## 2016-05-06 DIAGNOSIS — R05 Cough: Secondary | ICD-10-CM

## 2016-05-06 DIAGNOSIS — R059 Cough, unspecified: Secondary | ICD-10-CM

## 2016-05-06 MED ORDER — DOXYCYCLINE HYCLATE 100 MG PO CAPS: 100.0000 mg | ORAL_CAPSULE | Freq: Two times a day (BID) | ORAL | 0 refills | Status: DC

## 2016-05-06 MED ORDER — METHYLPREDNISOLONE ACETATE 80 MG/ML IJ SUSP
80.0000 mg | Freq: Once | INTRAMUSCULAR | Status: AC
  Administered 2016-05-06: 80 mg via INTRAMUSCULAR

## 2016-05-06 NOTE — Progress Notes (Signed)
Pre visit review using our clinic review tool, if applicable. No additional management support is needed unless otherwise documented below in the visit note. 

## 2016-05-06 NOTE — Telephone Encounter (Signed)
Editor: Burnis Medin, MD (Physician)    Can use an updated inhaler up to every 4-6 hours as needed for wheezing. If it is not improving your wheezing you can add prednisone 3-5 days as discussed. I believe you have a viral respiratory infection that is flaring up your asthma. Cough medicine is for comfort caution is it is a narcotic cough medicine. If you get relapsing fever not improving after the weekend contact us for advice for follow-up. Your cough may last weeks otherwise Continues Delsym or DM in the day.    Instructions      Return if symptoms worsen or fail to improve.  Can use an updated inhaler up to every 4-6 hours as needed for wheezing. If it is not improving your wheezing you can add prednisone 3-5 days as discussed. I believe you have a viral respiratory infection that is flaring up your asthma. Cough medicine is for comfort caution is it is a narcotic cough medicine. If you get relapsing fever not improving after the weekend contact us for advice for follow-up. Your cough may last weeks otherwise Continues Delsym or DM in the day.    Spoke to the pt.  She states a low grade fever at night but nothing over 100.  Cough is productive with traces of blood.  SOB.  Wheezing.  Chest pain but states from coughing.  Taking ProAir, prednisone and cough syrup.  States sx are worse than when she seen Dr. Regis Bill on Friday.  Please advise.  Thanks!!  Dr. Regis Bill or Dr. Sherren Mocha not in the office this week.

## 2016-05-06 NOTE — Telephone Encounter (Signed)
Pt rescheduled to today with Dr. Elease Hashimoto at 4:15

## 2016-05-06 NOTE — Progress Notes (Signed)
Subjective:     Patient ID: Anna Marquez, female   DOB: 03/12/1950, 66 y.o.   MRN: [id number]  HPI Patient seen with some persistent cough and wheezing. She has known history of asthma. She was seen here last week with cough, fever, and wheezing. She was started on prednisone 40 mg daily and given nebulizer along with new prescription for albuterol. She has 1 more day of prednisone. She does not feel much better. She states she's had some cough productive of green sputum that time. Never smoked. No hemoptysis. Still has significant wheezing. Denies any recent appetite or weight changes. Minimal nasal congestion. She is using her albuterol inhaler about every 6 hours with some temporary improvement.  Past Medical History:  Diagnosis Date  . Adenomatous colon polyp 2003  . Cholelithiasis   . Diverticulosis of colon   . Gastric polyps   . GERD (gastroesophageal reflux disease)   . Helicobacter pylori (H. pylori)   . Hiatal hernia   . Hypertension   . Mitral valve prolapse   . Nephrolithiasis   . Osteoarthritis   . Osteopenia 04/28/2014   T score -1.5 FRAX 8.2%/0.7%  . TGA (transient global amnesia)    Past Surgical History:  Procedure Laterality Date  . ANTERIOR AND POSTERIOR REPAIR N/A 06/14/2014   Procedure: ANTERIOR (CYSTOCELE) AND POSTERIOR REPAIR (RECTOCELE);  Surgeon: Anastasio Auerbach, MD;  Location: [facility];  Service: Gynecology;  Laterality: N/A;  . CHOLECYSTECTOMY    . kidney stone removal     x2  . VAGINAL HYSTERECTOMY N/A 06/14/2014   Procedure: HYSTERECTOMY VAGINAL;  Surgeon: Anastasio Auerbach, MD;  Location: [facility];  Service: Gynecology;  Laterality: N/A;    reports that she has never smoked. She has never used smokeless tobacco. She reports that she drinks alcohol. She reports that she does not use drugs. family history includes Breast cancer (age of onset: 22) in her paternal aunt; Cancer in her father; Congestive Heart Failure in her mother; Diabetes in her maternal  grandmother; Heart disease in her maternal grandfather and maternal grandmother; Melanoma in her paternal aunt; Neuropathy in her mother. Allergies  Allergen Reactions  . Penicillins Hives and Shortness Of Breath    Has patient had a PCN reaction causing immediate rash, facial/tongue/throat swelling, SOB or lightheadedness with hypotension: yes Has patient had a PCN reaction causing severe rash involving mucus membranes or skin necrosis: no Has patient had a PCN reaction that required hospitalization: unknown Has patient had a PCN reaction occurring within the last 10 years: no If all of the above answers are "NO", then may proceed with Cephalosporin use.   . Ibuprofen Other (See Comments)    Pt states allergy to NSAIDS is secondary to ulcers NOT anaphylaxis. Tolerates daily 325 ASA at home  . Erythromycin Nausea And Vomiting    REACTION: Stomach upset     Review of Systems  Constitutional: Negative for chills and fever.  HENT: Negative for sore throat.   Respiratory: Positive for cough, shortness of breath and wheezing.   Cardiovascular: Negative for chest pain.       Objective:   Physical Exam  Constitutional: She appears well-developed and well-nourished.  Cardiovascular: Normal rate and regular rhythm.   Pulmonary/Chest: No respiratory distress. She has wheezes. She has no rales.  Patient has diffuse expiratory wheezes throughout. No respiratory distress. No rales noted.       Assessment:     Patient presents with productive cough and persistent wheezing. Pulse oximetry 98%  and she is in no respiratory distress    Plan:     -Continue albuterol inhaler as needed -Depo-Medrol 80 mg IM given -Start doxycycline 100 mg twice daily for 7 days with caution for sun sensitivity -Follow-up immediately for any ongoing fever or increasing shortness of breath -Consider chest x-ray if not further improved over the next 48 hours  Eulas Post MD [facility]  [facility]

## 2016-05-06 NOTE — Telephone Encounter (Signed)
Spoke to the pt.  Informed her that she needs to be seen in the office.  Pt cannot come today.  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  Stated she would like to come tomorrow.  Scheduled with Dr. B at 9:30 AM.  Advised if she becomes worse to visit ED/UC.  Pt agreed.

## 2016-05-06 NOTE — Patient Instructions (Signed)
Follow up for any fever or increasing shortness of breath. 

## 2016-05-06 NOTE — Telephone Encounter (Signed)
If fever and coughing up blood needs to be re-evaluated.  May need CXR for further evaluation.

## 2016-05-07 ENCOUNTER — Ambulatory Visit: Payer: PPO | Admitting: Family Medicine

## 2016-05-27 ENCOUNTER — Other Ambulatory Visit: Payer: Self-pay | Admitting: Family Medicine

## 2016-05-31 ENCOUNTER — Encounter: Payer: Self-pay | Admitting: Family Medicine

## 2016-05-31 ENCOUNTER — Ambulatory Visit (INDEPENDENT_AMBULATORY_CARE_PROVIDER_SITE_OTHER): Payer: PPO | Admitting: Family Medicine

## 2016-05-31 VITALS — BP 140/90 | HR 80 | Temp 98.2°F | Wt 161.0 lb

## 2016-05-31 DIAGNOSIS — S70261A Insect bite (nonvenomous), right hip, initial encounter: Secondary | ICD-10-CM

## 2016-05-31 DIAGNOSIS — W57XXXA Bitten or stung by nonvenomous insect and other nonvenomous arthropods, initial encounter: Secondary | ICD-10-CM

## 2016-05-31 MED ORDER — DOXYCYCLINE HYCLATE 100 MG PO CAPS: 100.0000 mg | ORAL_CAPSULE | Freq: Two times a day (BID) | ORAL | 0 refills | Status: DC

## 2016-05-31 NOTE — Patient Instructions (Signed)
Tick Bite Information, Adult Ticks are insects that draw blood for food. Most ticks live in shrubs and grassy areas. They climb onto people and animals that brush against the leaves and grasses that they rest on. Then they bite, attaching themselves to the skin. Most ticks are harmless, but some ticks carry germs that can spread to a person through a bite and cause a disease. To reduce your risk of getting a disease from a tick bite, it is important to take steps to prevent tick bites. It is also important to check for ticks after being outdoors. If you find that a tick has attached to you, watch for symptoms of disease. How can I prevent tick bites? Take these steps to help prevent tick bites when you are outdoors in an area where ticks are found:  Use insect repellent that has DEET (20% or higher), picaridin, or IR3535 in it. Use it on:  Skin that is showing.  The top of your boots.  Your pant legs.  Your sleeve cuffs.  For repellent products that contain permethrin, follow product instructions. Use these products on:  Clothing.  Gear.  Boots.  Tents.  Wear protective clothing. Long sleeves and long pants offer the best protection from ticks.  Wear light-colored clothing so you can see ticks more easily.  Tuck your pant legs into your socks.  If you go walking on a trail, stay in the middle of the trail so your skin, hair, and clothing do not touch the bushes.  Avoid walking through areas with long grass.  Check for ticks on your clothing, hair, and skin often while you are outside, and check again before you go inside. Make sure to check the places that ticks attach themselves most often. These places include the scalp, neck, armpits, waist, groin, and joint areas. Ticks that carry a disease called Lyme disease have to be attached to the skin for 24-48 hours. Checking for ticks every day will lessen your risk of this and other diseases.  When you come indoors, wash your  clothes and take a shower or a bath right away. Dry your clothes in a dryer on high heat for at least 60 minutes. This will kill any ticks in your clothes. What is the proper way to remove a tick? If you find a tick on your body, remove it as soon as possible. Removing a tick sooner rather than later can prevent germs from passing from the tick to your body. To remove a tick that is crawling on your skin but has not bitten:  Go outdoors and brush the tick off.  Remove the tick with tape or a lint roller. To remove a tick that is attached to your skin:  Wash your hands.  If you have latex gloves, put them on.  Use tweezers, curved forceps, or a tick-removal tool to gently grasp the tick as close to your skin and the tick's head as possible.  Gently pull with steady, upward pressure until the tick lets go. When removing the tick:  Take care to keep the tick's head attached to its body.  Do not twist or jerk the tick. This can make the tick's head or mouth break off.  Do not squeeze or crush the tick's body. This could force disease-carrying fluids from the tick into your body. Do not try to remove a tick with heat, alcohol, petroleum jelly, or fingernail polish. Using these methods can cause the tick to salivate and regurgitate into your  bloodstream, increasing your risk of getting a disease. What should I do after removing a tick?  Clean the bite area with soap and water, rubbing alcohol, or an iodine scrub.  If an antiseptic cream or ointment is available, apply a small amount to the bite site.  Wash and disinfect any instruments that you used to remove the tick. How should I dispose of a tick? To dispose of a live tick, use one of these methods:  Place it in rubbing alcohol.  Place it in a sealed bag or container.  Wrap it tightly in tape.  Flush it down the toilet. Contact a health care provider if:  You have symptoms of a disease after a tick bite. Symptoms of a  tick-borne disease can occur from moments after the tick bites to up to 30 days after a tick is removed. Symptoms include:  Muscle, joint, or bone pain.  Difficulty walking or moving your legs.  Numbness in the legs.  Paralysis.  Red rash around the tick bite area that is shaped like a target or a "bull's-eye."  Redness and swelling in the area of the tick bite.  Fever.  Repeated vomiting.  Diarrhea.  Weight loss.  Tender, swollen lymph glands.  Shortness of breath.  Cough.  Pain in the abdomen.  Headache.  Abnormal tiredness.  A change in your level of consciousness.  Confusion. Get help right away if:  You are not able to remove a tick.  A part of a tick breaks off and gets stuck in your skin.  Your symptoms get worse. Summary  Ticks may carry germs that can spread to a person through a bite and cause disease.  Wear protective clothing and use insect repellent to prevent tick bites. Follow product instructions.  If you find a tick on your body, remove it as soon as possible. If the tick is attached, do not try to remove with heat, alcohol, petroleum jelly, or fingernail polish.  Remove the attached tick using tweezers, curved forceps, or a tick-removal tool. Gently pull with steady, upward pressure until the tick lets go. Do not twist or jerk the tick. Do not squeeze or crush the tick's body.  If you have symptoms after being bitten by a tick, contact a health care provider. This information is not intended to replace advice given to you by your health care provider. Make sure you discuss any questions you have with your health care provider. Document Released: 12/22/1999 Document Revised: 10/06/2015 Document Reviewed: 10/06/2015 Elsevier Interactive Patient Education  2017 [employer].

## 2016-05-31 NOTE — Progress Notes (Signed)
Subjective:     Patient ID: Anna Marquez, female   DOB: [birth year], 66 y.o.   MRN: [id number]  HPI Patient seen following tick bite yesterday. This sounded like what she described as a small deer tick right anterior hip region. She now has some increased redness and itching today. She went to pharmacist and was told that she needed to be on doxycycline. She has not had any headache, fever,arthralgia,  chills, or any other rash. No arthralgias. She tried some topical steroid cream without much improvement.  Past Medical History:  Diagnosis Date  . Adenomatous colon polyp 2003  . Cholelithiasis   . Diverticulosis of colon   . Gastric polyps   . GERD (gastroesophageal reflux disease)   . Helicobacter pylori (H. pylori)   . Hiatal hernia   . Hypertension   . Mitral valve prolapse   . Nephrolithiasis   . Osteoarthritis   . Osteopenia 04/28/2014   T score -1.5 FRAX 8.2%/0.7%  . TGA (transient global amnesia)    Past Surgical History:  Procedure Laterality Date  . ANTERIOR AND POSTERIOR REPAIR N/A 06/14/2014   Procedure: ANTERIOR (CYSTOCELE) AND POSTERIOR REPAIR (RECTOCELE);  Surgeon: Anastasio Auerbach, MD;  Location: [facility];  Service: Gynecology;  Laterality: N/A;  . CHOLECYSTECTOMY    . kidney stone removal     x2  . VAGINAL HYSTERECTOMY N/A 06/14/2014   Procedure: HYSTERECTOMY VAGINAL;  Surgeon: Anastasio Auerbach, MD;  Location: [facility];  Service: Gynecology;  Laterality: N/A;    reports that she has never smoked. She has never used smokeless tobacco. She reports that she drinks alcohol. She reports that she does not use drugs. family history includes Breast cancer (age of onset: 74) in her paternal aunt; Cancer in her father; Congestive Heart Failure in her mother; Diabetes in her maternal grandmother; Heart disease in her maternal grandfather and maternal grandmother; Melanoma in her paternal aunt; Neuropathy in her mother. Allergies  Allergen Reactions  . Penicillins Hives and  Shortness Of Breath    Has patient had a PCN reaction causing immediate rash, facial/tongue/throat swelling, SOB or lightheadedness with hypotension: yes Has patient had a PCN reaction causing severe rash involving mucus membranes or skin necrosis: no Has patient had a PCN reaction that required hospitalization: unknown Has patient had a PCN reaction occurring within the last 10 years: no If all of the above answers are "NO", then may proceed with Cephalosporin use.   . Ibuprofen Other (See Comments)    Pt states allergy to NSAIDS is secondary to ulcers NOT anaphylaxis. Tolerates daily 325 ASA at home  . Erythromycin Nausea And Vomiting    REACTION: Stomach upset     Review of Systems  Constitutional: Negative for chills and fever.  Musculoskeletal: Negative for arthralgias.  Skin: Positive for rash.  Neurological: Negative for headaches.       Objective:   Physical Exam  Constitutional: She appears well-developed and well-nourished.  Cardiovascular: Normal rate and regular rhythm.   Pulmonary/Chest: Effort normal and breath sounds normal. No respiratory distress. She has no wheezes. She has no rales.  Skin:  Right anterior hip region patient has approximately 1 cm hive with small punctate center but no obvious retained tick parts. No evidence for erythema migrans       Assessment:     Tick bite with local allergic reaction    Plan:     -No indication for antibiotics at this time.  follow-up promptly for any headache, progressive rash,  arthralgias, etc. -Handout on tick bite information given -We explained local allergic response may take several weeks to fully resolve  Eulas Post MD [facility]

## 2016-06-12 ENCOUNTER — Telehealth: Payer: Self-pay | Admitting: *Deleted

## 2016-06-12 NOTE — Telephone Encounter (Signed)
Patient is now taking care of her mother full time and would like to know if she can have a prescription for anxiety?  Patient understand that Dr Sherren Mocha is out of the office.

## 2016-06-12 NOTE — Telephone Encounter (Signed)
Please help pt schedule appt with another provider.  Todd not in the office until Sept.  She will need 30 minutes.  Thanks!!!

## 2016-06-13 NOTE — Telephone Encounter (Signed)
lmom for pt to call back

## 2016-06-18 NOTE — Progress Notes (Deleted)
Subjective:   Anna Marquez is a 66 y.o. female who presents for an Initial Medicare Annual Wellness Visit.  The Patient was informed that the wellness visit is to identify future health risk and educate and initiate measures that can reduce risk for increased disease through the lifespan.   Describes health as fair, good or great?  Review of Systems    No ROS.  Medicare Wellness Visit. Additional risk factors are reflected in the social history.     Sleep patterns: {SX; SLEEP PATTERNS:18802::"feels rested on waking","does not get up to void","gets up *** times nightly to void","sleeps *** hours nightly"}.   Home Safety/Smoke Alarms: Feels safe in home. Smoke alarms in place.  Living environment; residence and Firearm Safety: {Rehab home environment / accessibility:30080::"no firearms","firearms stored safely"}. Seat Belt Safety/Bike Helmet: Wears seat belt.   Counseling:   Eye Exam-  Dental-  Female:   Pap- N/A, hysterectomy       Mammo- last 08/07/15. BI-RADS CATEGORY  1: Negative.         Dexa scan- last 04/28/14. Osteopenia.         CCS- last 03/12/13. 2 polyps removed, moderate diverticulosis. 10 year recall.       Objective:    There were no vitals filed for this visit. There is no height or weight on file to calculate BMI.   Current Medications (verified) Outpatient Encounter Prescriptions as of 06/20/2016  Medication Sig  . albuterol (VENTOLIN HFA) 108 (90 Base) MCG/ACT inhaler Inhale 1-2 puffs into the lungs every 6 (six) hours as needed for wheezing or shortness of breath.  . Cholecalciferol (VITAMIN D3) 2000 units TABS Take 2,000 Units by mouth daily.  [name] doxycycline (VIBRAMYCIN) 100 MG capsule Take 1 capsule (100 mg total) by mouth 2 (two) times daily.  . fluocinonide cream (LIDEX) 9.21 % Apply 1 application topically 2 (two) times daily as needed (rash).   . fluticasone (FLONASE) 50 MCG/ACT nasal spray USE ONE SPRAY IN EACH NOSTRIL DAILY. (Patient taking  differently: Place 1 spray into both nostrils daily as needed for allergies or rhinitis. USE ONE SPRAY IN EACH NOSTRIL DAILY)  . lisinopril (PRINIVIL,ZESTRIL) 10 MG tablet Take 1 tablet (10 mg total) by mouth daily.  [name] loratadine (CLARITIN) 10 MG tablet Take 10 mg by mouth daily as needed for allergies, rhinitis or itching.  . nadolol (CORGARD) 40 MG tablet TAKE 1 TABLET (40 MG TOTAL) BY MOUTH TWICE DAILY. (Patient taking differently: Take 40 mg by mouth 2 (two) times daily. )  . Omega-3 Fatty Acids (FISH OIL PO) Take 3,000 mg by mouth daily.  . pantoprazole (PROTONIX) 40 MG tablet TAKE 1 TABLET BY MOUTH EVERY DAY  . predniSONE (DELTASONE) 20 MG tablet Take 1 tablet (20 mg total) by mouth 2 (two) times daily with a meal.   No facility-administered encounter medications on file as of 06/20/2016.     Allergies (verified) Penicillins; Ibuprofen; and Erythromycin   History: Past Medical History:  Diagnosis Date  . Adenomatous colon polyp 2003  . Cholelithiasis   . Diverticulosis of colon   . Gastric polyps   . GERD (gastroesophageal reflux disease)   . Helicobacter pylori (H. pylori)   . Hiatal hernia   . Hypertension   . Mitral valve prolapse   . Nephrolithiasis   . Osteoarthritis   . Osteopenia 04/28/2014   T score -1.5 FRAX 8.2%/0.7%  . TGA (transient global amnesia)    Past Surgical History:  Procedure Laterality Date  .  ANTERIOR AND POSTERIOR REPAIR N/A 06/14/2014   Procedure: ANTERIOR (CYSTOCELE) AND POSTERIOR REPAIR (RECTOCELE);  Surgeon: Anastasio Auerbach, MD;  Location: [facility];  Service: Gynecology;  Laterality: N/A;  . CHOLECYSTECTOMY    . kidney stone removal     x2  . VAGINAL HYSTERECTOMY N/A 06/14/2014   Procedure: HYSTERECTOMY VAGINAL;  Surgeon: Anastasio Auerbach, MD;  Location: [facility];  Service: Gynecology;  Laterality: N/A;   Family History  Problem Relation Age of Onset  . Cancer Father        prostate  . Breast cancer Paternal Aunt 45  . Diabetes Maternal  Grandmother   . Heart disease Maternal Grandmother   . Heart disease Maternal Grandfather   . Melanoma Paternal Aunt   . Neuropathy Mother   . Congestive Heart Failure Mother    Social History   Occupational History  . Not on file.   Social History Main Topics  . Smoking status: Never Smoker  . Smokeless tobacco: Never Used  . Alcohol use 0.0 oz/week     Comment: Occas  . Drug use: No  . Sexual activity: Yes    Birth control/ protection: Post-menopausal     Comment: 1st intercourse 66 yo-Fewer than 5 partners    Tobacco Counseling Counseling given: Not Answered   Activities of Daily Living In your present state of health, do you have any difficulty performing the following activities: 03/07/2016 08/21/2015  Hearing? N N  Vision? N N  Difficulty concentrating or making decisions? N Y  Walking or climbing stairs? N N  Dressing or bathing? N N  Doing errands, shopping? N N  Some recent data might be hidden    Immunizations and Health Maintenance Immunization History  Administered Date(s) Administered  . Influenza Whole 10/07/2009  . Influenza, High Dose Seasonal PF 09/12/2015  . Influenza, Seasonal, Injecte, Preservative Fre 01/09/2012  . Pneumococcal Conjugate-13 05/02/2014  . Pneumococcal Polysaccharide-23 09/12/2015  . Td 01/08/2007  . Zoster 09/12/2015   There are no preventive care reminders to display for this patient.  Patient Care Team: Dorena Cookey, MD as PCP - General Nahser, Wonda Cheng, MD (Cardiology)  Indicate any recent Medical Services you may have received from other than [facility] providers in the past year (date may be approximate).     Assessment:   This is a routine wellness examination for Anna Marquez. Physical assessment deferred to PCP.   Hearing/Vision screen No exam data present  Dietary issues and exercise activities discussed:    Diet (meal preparation, eat out, water intake, caffeinated beverages, dairy products, fruits and vegetables):  {Desc; diets:16563} Breakfast: Lunch:  Dinner:      Goals    None     Depression Screen PHQ 2/9 Scores 12/13/2015 05/02/2014  PHQ - 2 Score 0 0    Fall Risk Fall Risk  12/13/2015 05/02/2014  Falls in the past year? No No    Cognitive Function:        Screening Tests Health Maintenance  Topic Date Due  . Hepatitis C Screening  03/12/2017 (Originally 09-Apr-1950)  . INFLUENZA VACCINE  08/07/2016  . TETANUS/TDAP  01/07/2017  . MAMMOGRAM  08/06/2017  . COLONOSCOPY  03/13/2023  . DEXA SCAN  Completed  . PNA vac Low Risk Adult  Completed      Plan:    Follow-up w/ PCP as scheduled.  I have personally reviewed and noted the following in the patient's chart:   . Medical and social history . Use of alcohol,  tobacco or illicit drugs  . Current medications and supplements . Functional ability and status . Nutritional status . Physical activity . Advanced directives . List of other physicians . Vitals . Screenings to include cognitive, depression, and falls . Referrals and appointments  In addition, I have reviewed and discussed with patient certain preventive protocols, quality metrics, and best practice recommendations. A written personalized care plan for preventive services as well as general preventive health recommendations were provided to patient.     Dorrene German, RN   06/18/2016

## 2016-06-18 NOTE — Telephone Encounter (Signed)
lmom for pt to call back

## 2016-06-20 ENCOUNTER — Ambulatory Visit: Payer: PPO

## 2016-06-21 NOTE — Telephone Encounter (Signed)
lmom for pt to call back

## 2016-09-23 ENCOUNTER — Encounter: Payer: PPO | Admitting: Gynecology

## 2016-09-25 ENCOUNTER — Encounter: Payer: PPO | Admitting: Gynecology

## 2016-11-15 ENCOUNTER — Ambulatory Visit: Payer: PPO | Admitting: Family Medicine

## 2016-11-15 ENCOUNTER — Encounter: Payer: Self-pay | Admitting: Family Medicine

## 2016-11-15 VITALS — BP 138/100 | HR 87 | Temp 98.5°F | Ht 64.0 in | Wt 166.4 lb

## 2016-11-15 DIAGNOSIS — J4521 Mild intermittent asthma with (acute) exacerbation: Secondary | ICD-10-CM | POA: Diagnosis not present

## 2016-11-15 DIAGNOSIS — J22 Unspecified acute lower respiratory infection: Secondary | ICD-10-CM | POA: Diagnosis not present

## 2016-11-15 MED ORDER — PREDNISONE 20 MG PO TABS: 40.0000 mg | ORAL_TABLET | Freq: Every day | ORAL | 0 refills | Status: DC

## 2016-11-15 MED ORDER — BENZONATATE 100 MG PO CAPS: 100.0000 mg | ORAL_CAPSULE | Freq: Three times a day (TID) | ORAL | 0 refills | Status: DC | PRN

## 2016-11-15 NOTE — Patient Instructions (Signed)
Start the prednisone today.  Use the Tessalon as needed for cough and inhalers as needed.  I hope you are feeling better soon! Seek care immediately if worsening, new concerns or you are not improving with treatment.

## 2016-11-15 NOTE — Progress Notes (Signed)
HPI:  Acute visit for respiratory illness: -started: Started about 4 days ago -symptoms:nasal congestion, sore throat, cough, some wheezing, she has been using her albuterol for some asthma symptoms, mild shortness of breath at times -denies:fever, NVD, tooth pain -has tried: Albuterol -sick contacts/travel/risks: no reported flu, strep or tick exposure -grand kid was sick with the croup last week and she was caring for him -Hx of: Asthma, usually requires prednisone with respiratory illnesses, tolerates well -sign visit  ROS: See pertinent positives and negatives per HPI.  Past Medical History:  Diagnosis Date  . Adenomatous colon polyp 2003  . Cholelithiasis   . Diverticulosis of colon   . Gastric polyps   . GERD (gastroesophageal reflux disease)   . Helicobacter pylori (H. pylori)   . Hiatal hernia   . Hypertension   . Mitral valve prolapse   . Nephrolithiasis   . Osteoarthritis   . Osteopenia 04/28/2014   T score -1.5 FRAX 8.2%/0.7%  . TGA (transient global amnesia)     Past Surgical History:  Procedure Laterality Date  . CHOLECYSTECTOMY    . kidney stone removal     x2    Family History  Problem Relation Age of Onset  . Cancer Father        prostate  . Breast cancer Paternal Aunt 54  . Diabetes Maternal Grandmother   . Heart disease Maternal Grandmother   . Heart disease Maternal Grandfather   . Melanoma Paternal Aunt   . Neuropathy Mother   . Congestive Heart Failure Mother     Social History   Socioeconomic History  . Marital status: Divorced    Spouse name: None  . Number of children: None  . Years of education: None  . Highest education level: None  Social Needs  . Financial resource strain: None  . Food insecurity - worry: None  . Food insecurity - inability: None  . Transportation needs - medical: None  . Transportation needs - non-medical: None  Occupational History  . None  Tobacco Use  . Smoking status: Never Smoker  . Smokeless  tobacco: Never Used  Substance and Sexual Activity  . Alcohol use: Yes    Alcohol/week: 0.0 oz    Comment: Occas  . Drug use: No  . Sexual activity: Yes    Birth control/protection: Post-menopausal    Comment: 1st intercourse 66 yo-Fewer than 5 partners  Other Topics Concern  . None  Social History Narrative  . None     Current Outpatient Medications:  .  albuterol (VENTOLIN HFA) 108 (90 Base) MCG/ACT inhaler, Inhale 1-2 puffs into the lungs every 6 (six) hours as needed for wheezing or shortness of breath., Disp: 1 Inhaler, Rfl: 2 .  Cholecalciferol (VITAMIN D3) 2000 units TABS, Take 2,000 Units by mouth daily., Disp: , Rfl:  .  fluocinonide cream (LIDEX) 6.44 %, Apply 1 application topically 2 (two) times daily as needed (rash). , Disp: , Rfl: 1 .  fluticasone (FLONASE) 50 MCG/ACT nasal spray, USE ONE SPRAY IN EACH NOSTRIL DAILY. (Patient taking differently: Place 1 spray into both nostrils daily as needed for allergies or rhinitis. USE ONE SPRAY IN EACH NOSTRIL DAILY), Disp: 32 g, Rfl: 6 .  lisinopril (PRINIVIL,ZESTRIL) 10 MG tablet, Take 1 tablet (10 mg total) by mouth daily., Disp: 90 tablet, Rfl: 2 .  loratadine (CLARITIN) 10 MG tablet, Take 10 mg by mouth daily as needed for allergies, rhinitis or itching., Disp: , Rfl:  .  nadolol (CORGARD) 40  MG tablet, TAKE 1 TABLET (40 MG TOTAL) BY MOUTH TWICE DAILY. (Patient taking differently: Take 40 mg by mouth 2 (two) times daily. ), Disp: 180 tablet, Rfl: 4 .  Omega-3 Fatty Acids (FISH OIL PO), Take 3,000 mg by mouth daily., Disp: , Rfl:  .  pantoprazole (PROTONIX) 40 MG tablet, TAKE 1 TABLET BY MOUTH EVERY DAY, Disp: 90 tablet, Rfl: 4 .  benzonatate (TESSALON PERLES) 100 MG capsule, Take 1 capsule (100 mg total) 3 (three) times daily as needed by mouth., Disp: 20 capsule, Rfl: 0 .  predniSONE (DELTASONE) 20 MG tablet, Take 2 tablets (40 mg total) daily with breakfast by mouth., Disp: 8 tablet, Rfl: 0  EXAM:  Vitals:   11/15/16 1037   BP: (!) 138/100  Pulse: 87  Temp: 98.5 F (36.9 C)  SpO2: 97%    Body mass index is 28.56 kg/m.  GENERAL: vitals reviewed and listed above, alert, oriented, appears well hydrated and in no acute distress  HEENT: atraumatic, conjunttiva clear, no obvious abnormalities on inspection of external nose and ears, normal appearance of ear canals and TMs, clear nasal congestion, mild post oropharyngeal erythema with PND, no tonsillar edema or exudate, no sinus TTP  NECK: no obvious masses on inspection  LUNGS: clear to auscultation bilaterally, no wheezes, rales or rhonchi, good air movement,  CV: HRRR, no peripheral edema  MS: moves all extremities without noticeable abnormality  PSYCH: pleasant and cooperative, no obvious depression or anxiety  ASSESSMENT AND PLAN:  Discussed the following assessment and plan:  Acute respiratory infection  Mild intermittent asthma with acute exacerbation  -given HPI and exam findings today, a serious infection or illness is unlikely. We discussed potential etiologies, with VURI being most likely. We discussed treatment side effects, likely course, antibiotic misuse, transmission, and signs of developing a serious illness. -Likely has a flare of her asthma with the respiratory illness, her lung exam sounds good today but we will go ahead and treat with prednisone given her symptoms, Tessalon for cough, her inhalers as needed -of course, we advised to return or notify a doctor immediately if symptoms worsen or persist or new concerns arise.  Discussed options for urgent care and emergency care over the weekend.    Patient Instructions  Start the prednisone today.  Use the Tessalon as needed for cough and inhalers as needed.  I hope you are feeling better soon! Seek care immediately if worsening, new concerns or you are not improving with treatment.     Colin Benton R., DO

## 2016-11-20 ENCOUNTER — Ambulatory Visit: Payer: Self-pay | Admitting: *Deleted

## 2016-11-20 ENCOUNTER — Encounter: Payer: PPO | Admitting: Gynecology

## 2016-11-20 NOTE — Telephone Encounter (Signed)
Pt    Was   Seen  5  Days  Ago  By  Dr  Maudie Mercury  She   Was  Given  Prednisone   Tessalon  pearles   Pt  Has   Been  Using  Her  Own   Albuterol    Wheezing  Is  Better    But now  Is   Coughing

## 2016-11-20 NOTE — Telephone Encounter (Signed)
  Reason for Disposition . [1] Continuous (nonstop) coughing interferes with work or school AND [2] no improvement using cough treatment per Care Advice  Answer Assessment - Initial Assessment Questions 1. ONSET: "When did the cough begin?"       8   Days  Of   Cough   Getting  Worse  2. SEVERITY: "How bad is the cough today?"        MOD  3. RESPIRATORY DISTRESS: "Describe your breathing."      BETTER    PULSE  OX  [address]   AT  HOME  TODAY   4. FEVER: "Do you have a fever?" If so, ask: "What is your temperature, how was it measured, and when did it start?"     No fever  5. SPUTUM: "Describe the color of your sputum" (clear, white, yellow, green)      Yellowish  Green   Sputum  6. HEMOPTYSIS: "Are you coughing up any blood?" If so ask: "How much?" (flecks, streaks, tablespoons, etc.)      none 7. CARDIAC HISTORY: "Do you have any history of heart disease?" (e.g., heart attack, congestive heart failure)       Mitral  Valve  prolapse 8. LUNG HISTORY: "Do you have any history of lung disease?"  (e.g., pulmonary embolus, asthma, emphysema)     Asthmatic  Bronchitis    seasonl  9. PE RISK FACTORS: "Do you have a history of blood clots?" (or: recent major surgery, recent prolonged travel, bedridden )     none 10. OTHER SYMPTOMS: "Do you have any other symptoms?" (e.g., runny nose, wheezing, chest pain)      Congested  Runny  Nose  And  Earache   11. PREGNANCY: "Is there any chance you are pregnant?" "When was your last menstrual period?"       no 12. TRAVEL: "Have you traveled out of the country in the last month?" (e.g., travel history, exposures)       no  Protocols used: [place]

## 2016-11-21 ENCOUNTER — Encounter: Payer: Self-pay | Admitting: Internal Medicine

## 2016-11-21 ENCOUNTER — Ambulatory Visit: Payer: PPO | Admitting: Internal Medicine

## 2016-11-21 DIAGNOSIS — R059 Cough, unspecified: Secondary | ICD-10-CM | POA: Insufficient documentation

## 2016-11-21 DIAGNOSIS — R05 Cough: Secondary | ICD-10-CM

## 2016-11-21 MED ORDER — PREDNISONE 20 MG PO TABS
40.0000 mg | ORAL_TABLET | Freq: Every day | ORAL | 0 refills | Status: DC
Start: 2016-11-21 — End: 2017-01-31

## 2016-11-21 MED ORDER — DOXYCYCLINE HYCLATE 100 MG PO TABS: 100.0000 mg | ORAL_TABLET | Freq: Two times a day (BID) | ORAL | 0 refills | Status: DC

## 2016-11-21 NOTE — Assessment & Plan Note (Signed)
Rx for prednisone and doxycycline given significant wheezing and rhonchi on exam as well as the time course.

## 2016-11-21 NOTE — Progress Notes (Signed)
   Subjective:    Patient ID: Anna Marquez, female    DOB: Aug 06, 1950, 66 y.o.   MRN: [id number]  HPI The patient is a 66 YO female coming in for cough and wheezing with SOB. Going on for 9 days already. She just finished a prednisone pack Monday and was using tessalon perles with limited success. She did have mild improvement in cough on the prednisone although now her cough is more productive. She denies fevers but having chills. Using albuterol more often. Denies as much nose draining and congestion. Very fatigued.  Review of Systems  Constitutional: Positive for activity change, appetite change and chills. Negative for fatigue, fever and unexpected weight change.  HENT: Positive for congestion, postnasal drip, rhinorrhea and sinus pressure. Negative for ear discharge, ear pain, sinus pain, sneezing, sore throat, tinnitus, trouble swallowing and voice change.   Eyes: Negative.   Respiratory: Positive for cough, shortness of breath and wheezing. Negative for chest tightness.   Cardiovascular: Negative.   Gastrointestinal: Negative.   Musculoskeletal: Positive for myalgias.  Neurological: Negative.       Objective:   Physical Exam  Constitutional: She is oriented to person, place, and time. She appears well-developed and well-nourished.  HENT:  Head: Normocephalic and atraumatic.  Oropharynx with redness and clear drainage, nose with swollen turbinates, TMs normal bilaterally  Eyes: EOM are normal.  Neck: Normal range of motion. No thyromegaly present.  Cardiovascular: Normal rate and regular rhythm.  Pulmonary/Chest: Effort normal. No respiratory distress. She has wheezes. She has no rales.  Some wheezing bilaterally which does not clear with cough.  Abdominal: Soft.  Musculoskeletal: She exhibits tenderness.  Lymphadenopathy:    She has no cervical adenopathy.  Neurological: She is alert and oriented to person, place, and time.  Skin: Skin is warm and dry.   Vitals:   11/21/16 0941  BP: (!) 148/100  Pulse: 79  Temp: 97.9 F (36.6 C)  TempSrc: Oral  SpO2: 98%  Weight: 169 lb (76.7 kg)  Height: 5\' 4"  (1.626 m)      Assessment & Plan:

## 2016-11-21 NOTE — Patient Instructions (Signed)
We have sent in the prednisone. Take 2 pills daily for 1 week.  We have also sent in the antibiotic doxycycline to take 1 pill twice a day for 1 week.

## 2016-12-02 ENCOUNTER — Other Ambulatory Visit: Payer: Self-pay | Admitting: Family Medicine

## 2017-01-01 ENCOUNTER — Encounter: Payer: PPO | Admitting: Gynecology

## 2017-01-29 ENCOUNTER — Other Ambulatory Visit: Payer: Self-pay | Admitting: Family Medicine

## 2017-01-29 DIAGNOSIS — Z1231 Encounter for screening mammogram for malignant neoplasm of breast: Secondary | ICD-10-CM

## 2017-01-30 ENCOUNTER — Ambulatory Visit (HOSPITAL_BASED_OUTPATIENT_CLINIC_OR_DEPARTMENT_OTHER)
Admission: RE | Admit: 2017-01-30 | Discharge: 2017-01-30 | Disposition: A | Discharge status: Home / Self Care | Payer: PPO | Source: Ambulatory Visit | Attending: Family Medicine | Admitting: Family Medicine

## 2017-01-30 DIAGNOSIS — Z1231 Encounter for screening mammogram for malignant neoplasm of breast: Secondary | ICD-10-CM | POA: Diagnosis not present

## 2017-01-31 ENCOUNTER — Ambulatory Visit (INDEPENDENT_AMBULATORY_CARE_PROVIDER_SITE_OTHER)
Admission: RE | Admit: 2017-01-31 | Discharge: 2017-01-31 | Disposition: A | Discharge status: Home / Self Care | Payer: PPO | Source: Ambulatory Visit | Attending: Internal Medicine | Admitting: Internal Medicine

## 2017-01-31 ENCOUNTER — Encounter: Payer: Self-pay | Admitting: Internal Medicine

## 2017-01-31 ENCOUNTER — Other Ambulatory Visit (INDEPENDENT_AMBULATORY_CARE_PROVIDER_SITE_OTHER): Payer: PPO

## 2017-01-31 ENCOUNTER — Telehealth: Payer: Self-pay | Admitting: Cardiovascular Disease

## 2017-01-31 ENCOUNTER — Ambulatory Visit (INDEPENDENT_AMBULATORY_CARE_PROVIDER_SITE_OTHER): Payer: PPO | Admitting: Internal Medicine

## 2017-01-31 VITALS — BP 170/98 | HR 73 | Temp 98.4°F | Ht 64.0 in | Wt 163.0 lb

## 2017-01-31 DIAGNOSIS — R0602 Shortness of breath: Secondary | ICD-10-CM

## 2017-01-31 DIAGNOSIS — J309 Allergic rhinitis, unspecified: Secondary | ICD-10-CM | POA: Diagnosis not present

## 2017-01-31 DIAGNOSIS — R002 Palpitations: Secondary | ICD-10-CM | POA: Diagnosis not present

## 2017-01-31 DIAGNOSIS — I10 Essential (primary) hypertension: Secondary | ICD-10-CM

## 2017-01-31 DIAGNOSIS — R05 Cough: Secondary | ICD-10-CM | POA: Diagnosis not present

## 2017-01-31 DIAGNOSIS — R059 Cough, unspecified: Secondary | ICD-10-CM

## 2017-01-31 LAB — CBC
HCT: 37.7 % (ref 36.0–46.0)
HEMOGLOBIN: 13.1 g/dL (ref 12.0–15.0)
MCHC: 34.8 g/dL (ref 30.0–36.0)
MCV: 85 fl (ref 78.0–100.0)
Platelets: 219 10*3/uL (ref 150.0–400.0)
RBC: 4.44 Mil/uL (ref 3.87–5.11)
RDW: 13.8 % (ref 11.5–15.5)
WBC: 7 10*3/uL (ref 4.0–10.5)

## 2017-01-31 LAB — COMPREHENSIVE METABOLIC PANEL
ALK PHOS: 51 U/L (ref 39–117)
ALT: 9 U/L (ref 0–35)
AST: 11 U/L (ref 0–37)
Albumin: 4.2 g/dL (ref 3.5–5.2)
BUN: 10 mg/dL (ref 6–23)
CO2: 29 meq/L (ref 19–32)
CREATININE: 0.76 mg/dL (ref 0.40–1.20)
Calcium: 9.4 mg/dL (ref 8.4–10.5)
Chloride: 103 mEq/L (ref 96–112)
GFR: 80.44 mL/min (ref >60.00)
GLUCOSE: 101 mg/dL — AB (ref 70–99)
Potassium: 3.3 mEq/L — ABNORMAL LOW (ref 3.5–5.1)
SODIUM: 141 meq/L (ref 135–145)
TOTAL PROTEIN: 7.5 g/dL (ref 6.0–8.3)
Total Bilirubin: 2.1 mg/dL — ABNORMAL HIGH (ref 0.2–1.2)

## 2017-01-31 LAB — TROPONIN I: TNIDX: 0.01 ug/L (ref 0.00–0.06)

## 2017-01-31 MED ORDER — ALBUTEROL SULFATE HFA 108 (90 BASE) MCG/ACT IN AERS
1.0000 | INHALATION_SPRAY | Freq: Four times a day (QID) | RESPIRATORY_TRACT | 0 refills | Status: DC | PRN
Start: 2017-01-31 — End: 2017-10-28

## 2017-01-31 MED ORDER — HYDROCHLOROTHIAZIDE 25 MG PO TABS: 25.0000 mg | ORAL_TABLET | Freq: Every day | ORAL | 3 refills | Status: DC

## 2017-01-31 MED ORDER — FLUTICASONE PROPIONATE 50 MCG/ACT NA SUSP: NASAL | 0 refills | Status: DC

## 2017-01-31 NOTE — Patient Instructions (Signed)
We will add another medicine for blood pressure called hctz and want you to see your doctor back in 1-2 weeks. Check the blood pressure at home and call us if still >160 for the top number or >90 for the bottom number.  We have done the EKG today which was not changed.   We would like to do a chest x-ray today to make sure there is not an infection causing your blood pressure to be high.

## 2017-01-31 NOTE — Assessment & Plan Note (Signed)
Checking CXR, lung exam clear. Likely viral illness. Lung exam clear. Refilled albuterol and flonase to use prn. No indication for antibiotics or steroids today.

## 2017-01-31 NOTE — Progress Notes (Signed)
   Subjective:    Patient ID: Anna Marquez, female    DOB: Mar 12, 1950, 67 y.o.   MRN: [id number]  HPI The patient is a 67 YO female coming in for several concerns including cold symptoms (denies taking any otc cold medicines, taking tylenol, out of flonase, started about 3-4 days ago, daughter with strep throat but her throat does not hurt, lot of congestion in her head, cough, mild SOB, some chest tightness, using tesslon perles, cough mildly improving) and her blood pressure (taking lisinopril and nadolol, BP high at home the last day or so, bad headache for the last several days, no new otc medications or nsaids, denies chest pains but having chest pressure, does have headaches, no change to meds recently, some high or borderline bp in the recent past but not this high and previously were normal at home) and some fluttering in her chest recently (per the patient her cardiologist thinks she may have had some a fib in the past, she is having episodes of flutter recently, denies chest pains but having some pressure, no new medications or increase in caffeine).   Review of Systems  Constitutional: Positive for activity change, appetite change, chills and fatigue. Negative for fever and unexpected weight change.  HENT: Positive for congestion, postnasal drip and rhinorrhea. Negative for ear discharge, ear pain, sinus pain, sneezing, sore throat, tinnitus, trouble swallowing and voice change.   Eyes: Negative.   Respiratory: Positive for chest tightness and shortness of breath. Negative for cough and wheezing.   Cardiovascular: Negative.  Negative for chest pain, palpitations and leg swelling.  Gastrointestinal: Negative for abdominal distention, abdominal pain, constipation, diarrhea, nausea and vomiting.  Musculoskeletal: Negative.   Skin: Negative.   Neurological: Negative.   Psychiatric/Behavioral: Negative.       Objective:   Physical Exam  Constitutional: She is oriented to person, place,  and time. She appears well-developed and well-nourished.  HENT:  Head: Normocephalic and atraumatic.  Oropharynx with redness and clear drainage, TMs normal   Eyes: EOM are normal.  Neck: Normal range of motion.  Cardiovascular: Normal rate and regular rhythm.  No murmur heard. Pulmonary/Chest: Effort normal and breath sounds normal. No respiratory distress. She has no wheezes. She has no rales.  Abdominal: Soft. Bowel sounds are normal. She exhibits no distension. There is no tenderness. There is no rebound.  Musculoskeletal: She exhibits no edema.  Neurological: She is alert and oriented to person, place, and time. Coordination normal.  Skin: Skin is warm and dry.  Psychiatric: She has a normal mood and affect.   Vitals:   01/31/17 1546 01/31/17 1631  BP: (!) 180/110 (!) 170/98  Pulse: 73   Temp: 98.4 F (36.9 C)   TempSrc: Oral   SpO2: 99%   Weight: 163 lb (73.9 kg)   Height: 5\' 4"  (1.626 m)    EKG: Rate 65, sinus, axis normal, intervals okay QT borderline long, no st or t wave changes, when compared to 03/2016 no significant change    Assessment & Plan:

## 2017-01-31 NOTE — Assessment & Plan Note (Signed)
BP severely elevated today without clear cause. No new meds or otcs. She has not missed doses of her lisinopril or nadolol. Starting hctz daily and needs close follow up with PCP in 1-2 weeks.EKGdone in the office without changes. Checking troponin, CBC, CMP for any signs of urgency. She will monitor BP at home and call back with any ongoing high readings.

## 2017-01-31 NOTE — Telephone Encounter (Signed)
Pt c/o BP issue: STAT if pt c/o blurred vision, one-sided weakness or slurred speech  1. What are your last 5 BP readings? 173/107  2. Are you having any other symptoms (ex. Dizziness, headache, blurred vision, passed out)? Headache but patient has a virus and has been sick for the past few days   3. What is your BP issue? Running high

## 2017-01-31 NOTE — Telephone Encounter (Signed)
Spoke with patient who states her BP is higher than normal and she has not been feeling well, states she has had some type of viral sore throat illness over the past few days. She denies n/v/d and states she has been eating and drinking well. She states she has a lot of life stress right now caring for her elderly mother. She c/o headache, denies blurred vision or facial flushing. I reviewed her chart and advised that BP has been elevated at recent office visits with PCP. She confirms and states she has called PCP and has an appointment this afternoon with Dr. Sharlet Salina. She states she feels that she may also be having more a fib as she has felt more fluttering in her chest recently. I confirmed her medications and she is not taking an anti-coagulant. I advised that I can schedule an appointment with one of our APPs for next week. She agrees to call me after her appointment with PCP today to let me know if the appointment for next week is needed. She thanked me for my help.

## 2017-01-31 NOTE — Assessment & Plan Note (Addendum)
Some fluttering she reports which is not current. EKG without changes today. Have advised her to follow up with her cardiologist and consider holter monitor as diagnosis of a fib would likely result in change in management of her medications given hx tia.

## 2017-01-31 NOTE — Telephone Encounter (Signed)
Left message for patient to call back for follow up. Patient's chart reveals that Dr. Sharlet Salina, primary care, has added HCTZ 25 mg daily for BP and that ekg in their office was normal. I advised patient that if I do not talk with her today that I will call again Monday.

## 2017-01-31 NOTE — Telephone Encounter (Signed)
Spoke with patient who called to inform me of her visit with Dr. Sharlet Salina. She asked if I thought she should come in next week. I advised that we should give the HCTZ some time to work and check on her next week.  She verbalized agreement to cancel appointment with Truitt Merle, NP on 1/29 and to call next week to let me know how she is feeling. She had chest xray and lab work done today and is not yet aware of the results. She thanked me for my help.

## 2017-02-04 ENCOUNTER — Ambulatory Visit: Payer: PPO | Admitting: Nurse Practitioner

## 2017-02-06 ENCOUNTER — Ambulatory Visit: Payer: PPO | Admitting: Gynecology

## 2017-02-06 ENCOUNTER — Encounter: Payer: Self-pay | Admitting: Gynecology

## 2017-02-06 VITALS — BP 136/86 | Ht 64.0 in | Wt 162.0 lb

## 2017-02-06 DIAGNOSIS — Z1272 Encounter for screening for malignant neoplasm of vagina: Secondary | ICD-10-CM

## 2017-02-06 DIAGNOSIS — N952 Postmenopausal atrophic vaginitis: Secondary | ICD-10-CM

## 2017-02-06 DIAGNOSIS — Z01419 Encounter for gynecological examination (general) (routine) without abnormal findings: Secondary | ICD-10-CM | POA: Diagnosis not present

## 2017-02-06 DIAGNOSIS — Z01411 Encounter for gynecological examination (general) (routine) with abnormal findings: Secondary | ICD-10-CM

## 2017-02-06 DIAGNOSIS — M8580 Other specified disorders of bone density and structure, unspecified site: Secondary | ICD-10-CM

## 2017-02-06 NOTE — Progress Notes (Signed)
    Anna Marquez [birth year] [id number]        67 y.o.  G2P2 for breast and pelvic exam.  Doing well without gynecologic complaints  Past medical history,surgical history, problem list, medications, allergies, family history and social history were all reviewed and documented as reviewed in the EPIC chart.  ROS:  Performed with pertinent positives and negatives included in the history, assessment and plan.   Additional significant findings : None    Exam: [name] assistant Vitals:   02/06/17 1509  BP: 136/86  Weight: 162 lb (73.5 kg)  Height: 5\' 4"  (1.626 m)   Body mass index is 27.81 kg/m.  General appearance:  Normal affect, orientation and appearance. Skin: Grossly normal HEENT: Without gross lesions.  No cervical or supraclavicular adenopathy. Thyroid normal.  Lungs:  Clear without wheezing, rales or rhonchi Cardiac: RR, without RMG Abdominal:  Soft, nontender, without masses, guarding, rebound, organomegaly or hernia Breasts:  Examined lying and sitting without masses, retractions, discharge or axillary adenopathy. Pelvic:  Ext, BUS, Vagina: With atrophic changes  Adnexa: Without masses or tenderness    Anus and perineum: Normal   Rectovaginal: Normal sphincter tone without palpated masses or tenderness.    Assessment/Plan:  67 y.o. G2P2 female for breast and pelvic exam  1. Postmenopausal/atrophic genital changes.  Status post TVH anterior posterior repair 2016.  Doing well with good support.  No significant hot flushes, night sweats or vaginal dryness. 2. Mammography 01/2017.  Continue with annual mammography next year.  Breast exam normal today. 3. Osteopenia.  DEXA 2016 T score -1.5 FRAX 8% / 0.7%.  Recommend follow-up DEXA now she agrees to schedule in follow-up for this. 4. Colonoscopy 2015.  Repeat at their recommended interval. 5. Pap smear 2015.  No history of abnormal Pap smears.  Pap smear of vaginal cuff done today.  Reviewed current screening guidelines  and options to stop screening based on age and hysterectomy history reviewed.  Will readdress on an annual basis. 6. Health maintenance.  No routine lab work done as patient does this elsewhere.  Follow-up 1 year, sooner as needed.   Anastasio Auerbach MD, 4:00 PM 02/06/2017

## 2017-02-06 NOTE — Addendum Note (Signed)
Addended by: Nelva Nay on: 02/06/2017 04:22 PM   Modules accepted: Orders

## 2017-02-06 NOTE — Patient Instructions (Signed)
Followup for bone density as scheduled. 

## 2017-02-11 LAB — PAP IG W/ RFLX HPV ASCU

## 2017-02-15 ENCOUNTER — Other Ambulatory Visit: Payer: Self-pay | Admitting: Family Medicine

## 2017-03-10 ENCOUNTER — Other Ambulatory Visit: Payer: Self-pay | Admitting: Gynecology

## 2017-03-10 ENCOUNTER — Encounter: Payer: Self-pay | Admitting: Gynecology

## 2017-03-10 ENCOUNTER — Ambulatory Visit (INDEPENDENT_AMBULATORY_CARE_PROVIDER_SITE_OTHER): Payer: PPO

## 2017-03-10 DIAGNOSIS — M8589 Other specified disorders of bone density and structure, multiple sites: Secondary | ICD-10-CM

## 2017-03-10 DIAGNOSIS — M8580 Other specified disorders of bone density and structure, unspecified site: Secondary | ICD-10-CM

## 2017-03-20 ENCOUNTER — Ambulatory Visit (INDEPENDENT_AMBULATORY_CARE_PROVIDER_SITE_OTHER): Payer: PPO

## 2017-03-20 ENCOUNTER — Ambulatory Visit: Payer: PPO | Admitting: Nurse Practitioner

## 2017-03-20 ENCOUNTER — Encounter: Payer: Self-pay | Admitting: Nurse Practitioner

## 2017-03-20 VITALS — BP 130/88 | HR 76 | Temp 98.0°F | Wt 168.0 lb

## 2017-03-20 DIAGNOSIS — M542 Cervicalgia: Secondary | ICD-10-CM | POA: Diagnosis not present

## 2017-03-20 DIAGNOSIS — J309 Allergic rhinitis, unspecified: Secondary | ICD-10-CM

## 2017-03-20 DIAGNOSIS — J9801 Acute bronchospasm: Secondary | ICD-10-CM

## 2017-03-20 DIAGNOSIS — R202 Paresthesia of skin: Secondary | ICD-10-CM

## 2017-03-20 DIAGNOSIS — R29898 Other symptoms and signs involving the musculoskeletal system: Secondary | ICD-10-CM | POA: Diagnosis not present

## 2017-03-20 DIAGNOSIS — J0140 Acute pansinusitis, unspecified: Secondary | ICD-10-CM | POA: Diagnosis not present

## 2017-03-20 MED ORDER — LEVOFLOXACIN 500 MG PO TABS: 500.0000 mg | ORAL_TABLET | Freq: Every day | ORAL | 0 refills | Status: DC

## 2017-03-20 MED ORDER — METHYLPREDNISOLONE ACETATE 40 MG/ML IJ SUSP
40.0000 mg | Freq: Once | INTRAMUSCULAR | Status: AC
  Administered 2017-03-20: 40 mg via INTRAMUSCULAR

## 2017-03-20 MED ORDER — ALBUTEROL SULFATE (2.5 MG/3ML) 0.083% IN NEBU
2.5000 mg | INHALATION_SOLUTION | Freq: Once | RESPIRATORY_TRACT | Status: AC
  Administered 2017-03-20: 2.5 mg via RESPIRATORY_TRACT

## 2017-03-20 MED ORDER — IPRATROPIUM BROMIDE 0.03 % NA SOLN: 2.0000 | Freq: Two times a day (BID) | NASAL | 0 refills | Status: DC

## 2017-03-20 NOTE — Patient Instructions (Addendum)
Consider referral to neurology if normal labs and cervical DG. Cervical radiculopathy vs carpal tunnel Syndrome?   Disc space narrowing with bone spurs. This may be one of the reasons for hand numbness, tingling and weakness. If labs are normal. I recommend referral to neurology.  Stable BMP Persistent low TSH, normal T4. Normal B12 and folate. Need to discuss thyroid result further with pcp. Entered referral to neurology

## 2017-03-20 NOTE — Progress Notes (Signed)
Subjective:  Patient ID: Anna Marquez, female    DOB: 02/28/1950  Age: 67 y.o. MRN: [id number]  CC: URI (started a month ago, has been getting worse. Headache, sinus, wheezing. OTC hasnt helped)  URI   This is a new problem. The current episode started yesterday. The problem has been gradually worsening. The maximum temperature recorded prior to her arrival was 100.4 - 100.9 F. The fever has been present for 1 to 2 days. Associated symptoms include chest pain, congestion, coughing, headaches, a plugged ear sensation, rhinorrhea, sinus pain, sneezing, a sore throat, swollen glands and wheezing. She has tried acetaminophen and inhaler use for the symptoms. The treatment provided mild relief.   She also complains of numbness and tingling in bilateral hands. Onset several months ago. Worse in last 2weeks. Waxing and waning Worse with activity with hands or arms. No prolong morning stiffness Associated with weak grip, dropping objects, joint pain Denies joint swelling. No Fhx of RA  Outpatient Medications Prior to Visit  Medication Sig Dispense Refill  . albuterol (VENTOLIN HFA) 108 (90 Base) MCG/ACT inhaler Inhale 1-2 puffs into the lungs every 6 (six) hours as needed for wheezing or shortness of breath. 1 Inhaler 0  . benzonatate (TESSALON PERLES) 100 MG capsule Take 1 capsule (100 mg total) 3 (three) times daily as needed by mouth. 20 capsule 0  . Cholecalciferol (VITAMIN D3) 2000 units TABS Take 2,000 Units by mouth daily.    . fluocinonide cream (LIDEX) 0.63 % Apply 1 application topically 2 (two) times daily as needed (rash).   1  . fluticasone (FLONASE) 50 MCG/ACT nasal spray USE ONE SPRAY IN EACH NOSTRIL DAILY. 32 g 0  . hydrochlorothiazide (HYDRODIURIL) 25 MG tablet Take 1 tablet (25 mg total) by mouth daily. 90 tablet 3  . lisinopril (PRINIVIL,ZESTRIL) 10 MG tablet TAKE 1 TABLET BY MOUTH DAILY. 90 tablet 2  . nadolol (CORGARD) 40 MG tablet TAKE 1 TABLET BY MOUTH TWICE DAILY. 180  tablet 4  . Omega-3 Fatty Acids (FISH OIL PO) Take 3,000 mg by mouth daily.    . pantoprazole (PROTONIX) 40 MG tablet TAKE 1 TABLET BY MOUTH EVERY DAY 90 tablet 4   No facility-administered medications prior to visit.     ROS See HPI  Objective:  BP 130/88 (BP Location: Left Arm, Patient Position: Sitting, Cuff Size: Normal)   Pulse 76   Temp 98 F (36.7 C)   Wt 168 lb (76.2 kg)   SpO2 97%   BMI 28.84 kg/m   BP Readings from Last 3 Encounters:  03/20/17 130/88  02/06/17 136/86  01/31/17 (!) 170/98    Wt Readings from Last 3 Encounters:  03/20/17 168 lb (76.2 kg)  02/06/17 162 lb (73.5 kg)  01/31/17 163 lb (73.9 kg)    Physical Exam  Constitutional: She is oriented to person, place, and time.  HENT:  Right Ear: Tympanic membrane, external ear and ear canal normal.  Left Ear: Tympanic membrane, external ear and ear canal normal.  Nose: Mucosal edema and rhinorrhea present. Right sinus exhibits maxillary sinus tenderness. Right sinus exhibits no frontal sinus tenderness. Left sinus exhibits maxillary sinus tenderness. Left sinus exhibits no frontal sinus tenderness.  Mouth/Throat: Uvula is midline. No trismus in the jaw. Posterior oropharyngeal erythema present. No oropharyngeal exudate.  Eyes: No scleral icterus.  Neck: Normal range of motion. Neck supple.  Cardiovascular: Normal rate and normal heart sounds.  Pulmonary/Chest: Effort normal. She has wheezes. She has no rales.  Resolved  wheezing after nebulizer treatment  Musculoskeletal: She exhibits no edema.       Cervical back: Normal.  Hypertrophy of DIP and PIP. No deformity at MCP. No joint effusion.  Lymphadenopathy:    She has cervical adenopathy.  Neurological: She is alert and oriented to person, place, and time.  Vitals reviewed.   Lab Results  Component Value Date   WBC 7.0 01/31/2017   HGB 13.1 01/31/2017   HCT 37.7 01/31/2017   PLT 219.0 01/31/2017   GLUCOSE 100 (H) 03/20/2017   CHOL 176  08/21/2015   TRIG 175 (H) 08/21/2015   HDL 46 08/21/2015   LDLDIRECT 81.0 05/02/2014   LDLCALC 95 08/21/2015   ALT 9 01/31/2017   AST 11 01/31/2017   NA 138 03/20/2017   K 4.0 03/20/2017   CL 100 03/20/2017   CREATININE 0.90 03/20/2017   BUN 14 03/20/2017   CO2 28 03/20/2017   TSH 0.17 (L) 03/20/2017   INR 0.99 08/20/2015   HGBA1C 5.1 08/21/2015    Dg Chest 2 View  Result Date: 02/01/2017 CLINICAL DATA:  Cough and dyspnea for 1 week. EXAM: CHEST  2 VIEW COMPARISON:  03/07/2016 FINDINGS: The heart size and mediastinal contours are within normal limits. Both lungs are clear. Small to moderate hiatal hernia again noted. IMPRESSION: No active cardiopulmonary disease. Small to moderate hiatal hernia. Electronically Signed   By: Earle Gell M.D.   On: 02/01/2017 19:45    Assessment & Plan:   Larayne was seen today for uri.  Diagnoses and all orders for this visit:  Acute non-recurrent pansinusitis -     levofloxacin (LEVAQUIN) 500 MG tablet; Take 1 tablet (500 mg total) by mouth daily. -     methylPREDNISolone acetate (DEPO-MEDROL) injection 40 mg  Acute bronchospasm -     albuterol (PROVENTIL) (2.5 MG/3ML) 0.083% nebulizer solution 2.5 mg -     levofloxacin (LEVAQUIN) 500 MG tablet; Take 1 tablet (500 mg total) by mouth daily. -     methylPREDNISolone acetate (DEPO-MEDROL) injection 40 mg  Allergic rhinitis, unspecified seasonality, unspecified trigger -     ipratropium (ATROVENT) 0.03 % nasal spray; Place 2 sprays into both nostrils 2 (two) times daily. Do not use for more than 5days.  Weakness of hand -     Basic metabolic panel -     Ambulatory referral to Neurology  Paresthesia of hand, bilateral -     DG Cervical Spine Complete; Future -     TSH -     T4, free -     B12 -     Basic metabolic panel -     DG Cervical Spine Complete -     Ambulatory referral to Neurology   I am having Anna Marquez "[name]" start on levofloxacin and ipratropium. I am also having her  maintain her fluocinonide cream, Vitamin D3, Omega-3 Fatty Acids (FISH OIL PO), pantoprazole, benzonatate, lisinopril, albuterol, fluticasone, hydrochlorothiazide, and nadolol. We administered albuterol and methylPREDNISolone acetate.  Meds ordered this encounter  Medications  . albuterol (PROVENTIL) (2.5 MG/3ML) 0.083% nebulizer solution 2.5 mg  . levofloxacin (LEVAQUIN) 500 MG tablet    Sig: Take 1 tablet (500 mg total) by mouth daily.    Dispense:  5 tablet    Refill:  0    Order Specific Question:   Supervising Provider    Answer:   Lucille Passy [[id number]]  . methylPREDNISolone acetate (DEPO-MEDROL) injection 40 mg  . ipratropium (ATROVENT) 0.03 % nasal spray  Sig: Place 2 sprays into both nostrils 2 (two) times daily. Do not use for more than 5days.    Dispense:  30 mL    Refill:  0    Order Specific Question:   Supervising Provider    Answer:   Lucille Passy [[id number]]    Follow-up: No Follow-up on file.  Wilfred Lacy, NP

## 2017-03-21 ENCOUNTER — Encounter: Payer: Self-pay | Admitting: Nurse Practitioner

## 2017-03-21 LAB — BASIC METABOLIC PANEL
BUN: 14 mg/dL (ref 6–23)
CHLORIDE: 100 meq/L (ref 96–112)
CO2: 28 mEq/L (ref 19–32)
Calcium: 9.8 mg/dL (ref 8.4–10.5)
Creatinine, Ser: 0.9 mg/dL (ref 0.40–1.20)
GFR: 66.15 mL/min (ref >60.00)
Glucose, Bld: 100 mg/dL — ABNORMAL HIGH (ref 70–99)
POTASSIUM: 4 meq/L (ref 3.5–5.1)
SODIUM: 138 meq/L (ref 135–145)

## 2017-03-21 LAB — TSH: TSH: 0.17 u[IU]/mL — AB (ref 0.35–4.50)

## 2017-03-21 LAB — T4, FREE: Free T4: 0.93 ng/dL (ref 0.60–1.60)

## 2017-03-21 LAB — VITAMIN B12: Vitamin B-12: 287 pg/mL (ref 211–911)

## 2017-05-06 ENCOUNTER — Encounter: Payer: Self-pay | Admitting: Cardiovascular Disease

## 2017-05-23 ENCOUNTER — Ambulatory Visit (INDEPENDENT_AMBULATORY_CARE_PROVIDER_SITE_OTHER): Payer: PPO | Admitting: Cardiovascular Disease

## 2017-05-23 ENCOUNTER — Encounter: Payer: Self-pay | Admitting: Cardiovascular Disease

## 2017-05-23 VITALS — BP 128/80 | HR 77 | Ht 64.0 in | Wt 166.1 lb

## 2017-05-23 DIAGNOSIS — I10 Essential (primary) hypertension: Secondary | ICD-10-CM | POA: Diagnosis not present

## 2017-05-23 DIAGNOSIS — I480 Paroxysmal atrial fibrillation: Secondary | ICD-10-CM | POA: Diagnosis not present

## 2017-05-23 MED ORDER — APIXABAN 5 MG PO TABS: 5.0000 mg | ORAL_TABLET | Freq: Two times a day (BID) | ORAL | 11 refills | Status: DC

## 2017-05-23 NOTE — Patient Instructions (Signed)
Your physician has recommended you make the following change in your medication:  1.) start apixaban (Eliquis) 5 mg --one tablet twice a day  Your physician recommends that you return for lab work in: in one month and then in 6 months-Dec  (cbc, bmet)  Your physician wants you to follow-up in: 1 year with Dr. Acie Fredrickson. You will receive a reminder letter in the mail two months in advance. If you don't receive a letter, please call our office to schedule the follow-up appointment.

## 2017-05-23 NOTE — Progress Notes (Signed)
Anna Marquez Date of Birth  1950/12/01       [facility] [address], [address], [place], [place]  [address]   [place], [place]  [address] [phone]     [phone]   Fax  [phone]    Fax [phone]  Problem List: 1. Paroxysmal atrial fibrillation - documented Dec. 2013.   CHADS2VASC = 3 ( female, age, HTN)  -  2. Hypertension -    3.  Mitral Valve prolapse 4. Asthma 5, GERD   Notes from 2014: Anna Marquez is a 67 year old female with a history of mitral valve prolapse and hypertension in the past. She recently presented to the hospital with palpitations.  She woke up with palpitations.  She drinks alcohol only very rarely.  She was found to have atrial fibrillation with a rapid ventricular response. She taken an extra Corgard prior to coming to the emergency room. She converted to sinus rhythm while in the emergency room. Followup EKG revealed normal sinus rhythm.  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 She tries to exercise on a daily basis.  She has had occasional palpitations that sound like PVCs .    April 27, 2012:  Anna Marquez is doing well.  No palpitations.  Exercising regularly.  No complaints  April 03, 2016:  Anna Marquez was recently admitted to the hospital with chest pain. She has a history of paroxysmal atrial fibrillation, hypertension, mitral valve prolapse, asthma, and gastroesophageal reflux disease.  She had a treadmill status which was negative for ischemia. She has normal left ventricular systolic function by echo.  She walks regularly .  Has had some CP that she thinks is reflux.   Is taking PPI   Is retired from [employer]   May 23, 2017: Anna Marquez is seen today for follow-up of her paroxysmal atrial for ablation, hypertension, mitral valve prolapse, and asthma.  She was seen last year. Has some palpitations.  Last for a few minutes. Also has some HR  irregularities that last for several hours.  These typically get better if she takes another Corgard.   Current Outpatient Medications on File Prior to Visit  Medication Sig Dispense Refill  . albuterol (VENTOLIN HFA) 108 (90 Base) MCG/ACT inhaler Inhale 1-2 puffs into the lungs every 6 (six) hours as needed for wheezing or shortness of breath. 1 Inhaler 0  . Cholecalciferol (VITAMIN D3) 2000 units TABS Take 2,000 Units by mouth daily.    . fluocinonide cream (LIDEX) 9.98 % Apply 1 application topically 2 (two) times daily as needed (rash).   1  . fluticasone (FLONASE) 50 MCG/ACT nasal spray USE ONE SPRAY IN EACH NOSTRIL DAILY. 32 g 0  . hydrochlorothiazide (HYDRODIURIL) 25 MG tablet Take 1 tablet (25 mg total) by mouth daily. 90 tablet 3  . ipratropium (ATROVENT) 0.03 % nasal spray Place 2 sprays into both nostrils 2 (two) times daily. Do not use for more than 5days. 30 mL 0  . lisinopril (PRINIVIL,ZESTRIL) 10 MG tablet TAKE 1 TABLET BY MOUTH DAILY. 90 tablet 2  . nadolol (CORGARD) 40 MG tablet TAKE 1 TABLET BY MOUTH TWICE DAILY. 180 tablet 4  . Omega-3 Fatty Acids (FISH OIL PO) Take 3,000 mg by mouth daily.    . pantoprazole (PROTONIX) 40 MG tablet TAKE 1 TABLET BY MOUTH EVERY DAY 90 tablet 4   No current facility-administered medications on file prior  to visit.     Allergies  Allergen Reactions  . Penicillins Hives and Shortness Of Breath    Has patient had a PCN reaction causing immediate rash, facial/tongue/throat swelling, SOB or lightheadedness with hypotension: yes Has patient had a PCN reaction causing severe rash involving mucus membranes or skin necrosis: no Has patient had a PCN reaction that required hospitalization: unknown Has patient had a PCN reaction occurring within the last 10 years: no If all of the above answers are "NO", then may proceed with Cephalosporin use.   . Ibuprofen Other (See Comments)    Pt states allergy to NSAIDS is secondary to ulcers NOT anaphylaxis.  Tolerates daily 325 ASA at home  . Erythromycin Nausea And Vomiting    REACTION: Stomach upset    Past Medical History:  Diagnosis Date  . Adenomatous colon polyp 2003  . Cholelithiasis   . Diverticulosis of colon   . Gastric polyps   . GERD (gastroesophageal reflux disease)   . Helicobacter pylori (H. pylori)   . Hiatal hernia   . Hypertension   . Mitral valve prolapse   . Nephrolithiasis   . Osteoarthritis   . Osteopenia 03/2017   T score -1.5 FRAX 8.9% / 0.9%.  Stable from prior DEXA 2016  . TGA (transient global amnesia)     Past Surgical History:  Procedure Laterality Date  . ANTERIOR AND POSTERIOR REPAIR N/A 06/14/2014   Procedure: ANTERIOR (CYSTOCELE) AND POSTERIOR REPAIR (RECTOCELE);  Surgeon: Anastasio Auerbach, MD;  Location: [facility];  Service: Gynecology;  Laterality: N/A;  . CHOLECYSTECTOMY    . kidney stone removal     x2  . VAGINAL HYSTERECTOMY N/A 06/14/2014   Procedure: HYSTERECTOMY VAGINAL;  Surgeon: Anastasio Auerbach, MD;  Location: [facility];  Service: Gynecology;  Laterality: N/A;    Social History   Tobacco Use  Smoking Status Never Smoker  Smokeless Tobacco Never Used    Social History   Substance and Sexual Activity  Alcohol Use Yes  . Alcohol/week: 0.0 oz   Comment: Occas    Family History  Problem Relation Age of Onset  . Cancer Father        prostate  . Breast cancer Paternal Aunt 49  . Diabetes Maternal Grandmother   . Heart disease Maternal Grandmother   . Heart disease Maternal Grandfather   . Melanoma Paternal Aunt   . Neuropathy Mother   . Congestive Heart Failure Mother     Reviw of Systems:  Reviewed in the HPI.  All other systems are negative.  Physical Exam: Blood pressure 128/80, pulse 77, height 5\' 4"  (1.626 m), weight 166 lb 1.9 oz (75.4 kg), SpO2 97 %.  GEN:  Well nourished, well developed in no acute distress HEENT: Normal NECK: No JVD; No carotid bruits LYMPHATICS: No lymphadenopathy CARDIAC: RR, no murmurs,  rubs, gallops RESPIRATORY:  Clear to auscultation without rales, wheezing or rhonchi  ABDOMEN: Soft, non-tender, non-distended MUSCULOSKELETAL:  No edema; No deformity  SKIN: Warm and dry NEUROLOGIC:  Alert and oriented x 3   ECG:    Assessment / Plan:   1. Paroxysmal atrial fibrillation - documented Dec. 2013.   CHADS2VASC = 3 ( female, age, HTN)  -  Start eliquis 5 mg PO BID  Check BMP and CBC in 1 month and in 6 months . Will see her in a year   2. Hypertension - BP is well controlled.   3.  Mitral Valve prolapse - stable  4. Asthma 5,  GERD   She will monitor her blood pressure and keep a record. Dr. Sherren Mocha has been managing this. I would be happy to provide some input if needed.    Mertie Moores, MD  05/23/2017 11:03 AM    [facility] [place],  [place], [place]  [address] Pager [phone] Phone: [phone]; Fax: [phone]

## 2017-06-04 ENCOUNTER — Other Ambulatory Visit: Payer: Self-pay | Admitting: Family Medicine

## 2017-06-23 ENCOUNTER — Other Ambulatory Visit: Payer: PPO | Admitting: *Deleted

## 2017-06-23 DIAGNOSIS — I480 Paroxysmal atrial fibrillation: Secondary | ICD-10-CM | POA: Diagnosis not present

## 2017-06-23 DIAGNOSIS — I10 Essential (primary) hypertension: Secondary | ICD-10-CM

## 2017-06-23 LAB — BASIC METABOLIC PANEL
BUN/Creatinine Ratio: 21 (ref 12–28)
BUN: 22 mg/dL (ref 8–27)
CO2: 21 mmol/L (ref 20–29)
CREATININE: 1.07 mg/dL — AB (ref 0.57–1.00)
Calcium: 9.9 mg/dL (ref 8.7–10.3)
Chloride: 103 mmol/L (ref 96–106)
GFR calc Af Amer: 62 mL/min/{1.73_m2} (ref >59)
GFR, EST NON AFRICAN AMERICAN: 53 mL/min/{1.73_m2} — AB (ref >59)
Glucose: 105 mg/dL — ABNORMAL HIGH (ref 65–99)
POTASSIUM: 3.7 mmol/L (ref 3.5–5.2)
SODIUM: 140 mmol/L (ref 134–144)

## 2017-06-23 LAB — CBC
HEMATOCRIT: 33.4 % — AB (ref 34.0–46.6)
HEMOGLOBIN: 11.9 g/dL (ref 11.1–15.9)
MCH: 30.7 pg (ref 26.6–33.0)
MCHC: 35.6 g/dL (ref 31.5–35.7)
MCV: 86 fL (ref 79–97)
Platelets: 167 10*3/uL (ref 150–450)
RBC: 3.87 x10E6/uL (ref 3.77–5.28)
RDW: 14 % (ref 12.3–15.4)
WBC: 6.2 10*3/uL (ref 3.4–10.8)

## 2017-07-04 ENCOUNTER — Encounter: Payer: Self-pay | Admitting: Diagnostic Neuroimaging

## 2017-07-04 ENCOUNTER — Ambulatory Visit: Payer: PPO | Admitting: Diagnostic Neuroimaging

## 2017-07-04 VITALS — BP 117/76 | HR 76 | Ht 64.0 in | Wt 168.8 lb

## 2017-07-04 DIAGNOSIS — R200 Anesthesia of skin: Secondary | ICD-10-CM | POA: Diagnosis not present

## 2017-07-04 DIAGNOSIS — M545 Low back pain: Secondary | ICD-10-CM

## 2017-07-04 DIAGNOSIS — M542 Cervicalgia: Secondary | ICD-10-CM

## 2017-07-04 DIAGNOSIS — G8929 Other chronic pain: Secondary | ICD-10-CM | POA: Diagnosis not present

## 2017-07-04 NOTE — Patient Instructions (Signed)
-   PT / OT evaluation  - retrun for electrical nerve testing (EMG)

## 2017-07-04 NOTE — Progress Notes (Signed)
[facility]  PATIENT: Anna Marquez DOB: 10-Jul-1950  REFERRING CLINICIAN: Zetta Bills, NP HISTORY FROM: patient  REASON FOR VISIT: new consult    HISTORICAL  CHIEF COMPLAINT:  Chief Complaint  Patient presents with  . NP  Wilfred Lacy, NP,  Internal Referral  . Weakness bil hands L> R ,  Shoulder pain,  Burning bee sting    Xrays done in office.     HISTORY OF PRESENT ILLNESS:   67 year old female with history of hypertension, atrial fibrillation, here for evaluation of bilateral hand numbness, right knee pain, bilateral thigh numbness.  Patient has had onset of intermittent, progressively worsening bilateral hand numbness for the past 1 year.  Symptoms mainly affect her hands, and slightly into her forearms.  She has some mild neck pain.  Patient having some trouble with fine motor movements.  For past 4 to 6 months patient has had intermittent, worsening thigh numbness, especially if she stands up for a long time.  Patient sometimes has to stop walking or sit down.  She has some bilateral hip pain.  She also has mild low back pain.  Patient also having right knee aching pain for several months.  Patient has been limited in her physical activities due to the symptoms.    REVIEW OF SYSTEMS: Full 14 system review of systems performed and negative with exception of: Numbness weakness joint pain aching muscles.  ALLERGIES: Allergies  Allergen Reactions  . Penicillins Hives and Shortness Of Breath    Has patient had a PCN reaction causing immediate rash, facial/tongue/throat swelling, SOB or lightheadedness with hypotension: yes Has patient had a PCN reaction causing severe rash involving mucus membranes or skin necrosis: no Has patient had a PCN reaction that required hospitalization: unknown Has patient had a PCN reaction occurring within the last 10 years: no If all of the above answers are "NO", then may proceed with Cephalosporin use.   . Ibuprofen  Other (See Comments)    Pt states allergy to NSAIDS is secondary to ulcers NOT anaphylaxis. Tolerates daily 325 ASA at home  . Erythromycin Nausea And Vomiting    REACTION: Stomach upset    HOME MEDICATIONS: Outpatient Medications Prior to Visit  Medication Sig Dispense Refill  . albuterol (VENTOLIN HFA) 108 (90 Base) MCG/ACT inhaler Inhale 1-2 puffs into the lungs every 6 (six) hours as needed for wheezing or shortness of breath. 1 Inhaler 0  . apixaban (ELIQUIS) 5 MG TABS tablet Take 1 tablet (5 mg total) by mouth 2 (two) times daily. 60 tablet 11  . Cholecalciferol (VITAMIN D3) 2000 units TABS Take 2,000 Units by mouth daily.    . fluocinonide cream (LIDEX) 3.81 % Apply 1 application topically 2 (two) times daily as needed (rash).   1  . fluticasone (FLONASE) 50 MCG/ACT nasal spray USE ONE SPRAY IN EACH NOSTRIL DAILY. (Patient taking differently: USE ONE SPRAY IN EACH NOSTRIL DAILY PRN) 32 g 0  . hydrochlorothiazide (HYDRODIURIL) 25 MG tablet Take 1 tablet (25 mg total) by mouth daily. 90 tablet 3  . ipratropium (ATROVENT) 0.03 % nasal spray Place 2 sprays into both nostrils 2 (two) times daily. Do not use for more than 5days. 30 mL 0  . lisinopril (PRINIVIL,ZESTRIL) 10 MG tablet TAKE 1 TABLET BY MOUTH DAILY. 90 tablet 2  . nadolol (CORGARD) 40 MG tablet TAKE 1 TABLET BY MOUTH TWICE DAILY. 180 tablet 4  . Omega-3 Fatty Acids (FISH OIL PO) Take 3,000 mg by mouth daily.    [name]  pantoprazole (PROTONIX) 40 MG tablet TAKE 1 TABLET BY MOUTH EVERY DAY 90 tablet 4   No facility-administered medications prior to visit.     PAST MEDICAL HISTORY: Past Medical History:  Diagnosis Date  . Adenomatous colon polyp 2003  . Cholelithiasis   . Diverticulosis of colon   . Gastric polyps   . GERD (gastroesophageal reflux disease)   . Helicobacter pylori (H. pylori)   . Hiatal hernia   . Hypertension   . Mitral valve prolapse   . Nephrolithiasis   . Osteoarthritis   . Osteopenia 03/2017   T score  -1.5 FRAX 8.9% / 0.9%.  Stable from prior DEXA 2016  . TGA (transient global amnesia)     PAST SURGICAL HISTORY: Past Surgical History:  Procedure Laterality Date  . ANTERIOR AND POSTERIOR REPAIR N/A 06/14/2014   Procedure: ANTERIOR (CYSTOCELE) AND POSTERIOR REPAIR (RECTOCELE);  Surgeon: Anastasio Auerbach, MD;  Location: [facility];  Service: Gynecology;  Laterality: N/A;  . CHOLECYSTECTOMY    . kidney stone removal     x2  . VAGINAL HYSTERECTOMY N/A 06/14/2014   Procedure: HYSTERECTOMY VAGINAL;  Surgeon: Anastasio Auerbach, MD;  Location: [facility];  Service: Gynecology;  Laterality: N/A;    FAMILY HISTORY: Family History  Problem Relation Age of Onset  . Cancer Father        prostate  . Breast cancer Paternal Aunt 62  . Diabetes Maternal Grandmother   . Heart disease Maternal Grandmother   . Heart disease Maternal Grandfather   . Melanoma Paternal Aunt   . Neuropathy Mother   . Congestive Heart Failure Mother     SOCIAL HISTORY:  Social History   Socioeconomic History  . Marital status: Divorced    Spouse name: Not on file  . Number of children: Not on file  . Years of education: Not on file  . Highest education level: Not on file  Occupational History  . Not on file  Social Needs  . Financial resource strain: Not on file  . Food insecurity:    Worry: Not on file    Inability: Not on file  . Transportation needs:    Medical: Not on file    Non-medical: Not on file  Tobacco Use  . Smoking status: Never Smoker  . Smokeless tobacco: Never Used  Substance and Sexual Activity  . Alcohol use: Yes    Alcohol/week: 0.0 oz    Comment: Occas  . Drug use: No  . Sexual activity: Yes    Birth control/protection: Post-menopausal    Comment: 1st intercourse 67 yo-Fewer than 5 partners  Lifestyle  . Physical activity:    Days per week: Not on file    Minutes per session: Not on file  . Stress: Not on file  Relationships  . Social connections:    Talks on phone: Not on file     Gets together: Not on file    Attends religious service: Not on file    Active member of club or organization: Not on file    Attends meetings of clubs or organizations: Not on file    Relationship status: Not on file  . Intimate partner violence:    Fear of current or ex partner: Not on file    Emotionally abused: Not on file    Physically abused: Not on file    Forced sexual activity: Not on file  Other Topics Concern  . Not on file  Social History Narrative  . Not on  file     PHYSICAL EXAM  GENERAL EXAM/CONSTITUTIONAL: Vitals:  Vitals:   07/04/17 1002  BP: 117/76  Pulse: 76  Weight: 168 lb 12.8 oz (76.6 kg)  Height: 5\' 4"  (1.626 m)     Body mass index is 28.97 kg/m.  Visual Acuity Screening   Right eye Left eye Both eyes  Without correction:     With correction: 20/30 20/30      Patient is in no distress; well developed, nourished and groomed; neck is supple  CARDIOVASCULAR:  Examination of carotid arteries is normal; no carotid bruits  Regular rate and rhythm, no murmurs  Examination of peripheral vascular system by observation and palpation is normal  EYES:  Ophthalmoscopic exam of optic discs and posterior segments is normal; no papilledema or hemorrhages  MUSCULOSKELETAL:  Gait, strength, tone, movements noted in Neurologic exam below  NEUROLOGIC: MENTAL STATUS:  No flowsheet data found.  awake, alert, oriented to person, place and time  recent and remote memory intact  normal attention and concentration  language fluent, comprehension intact, naming intact,   fund of knowledge appropriate  CRANIAL NERVE:   2nd - no papilledema on fundoscopic exam  2nd, 3rd, 4th, 6th - pupils equal and reactive to light, visual fields full to confrontation, extraocular muscles intact, no nystagmus  5th - facial sensation symmetric  7th - facial strength symmetric  8th - hearing intact  9th - palate elevates symmetrically, uvula  midline  11th - shoulder shrug symmetric  12th - tongue protrusion midline  MOTOR:   normal bulk and tone, full strength in the BUE, BLE  LEFT SHOULDER ROM TENDERNESS  SENSORY:   normal and symmetric to light touch, pinprick, temperature, vibration  COORDINATION:   finger-nose-finger, fine finger movements normal  REFLEXES:   deep tendon reflexes present and symmetric; ABSENT AT ANKLES  GAIT/STATION:   narrow based gait; able to walk tandem    DIAGNOSTIC DATA (LABS, IMAGING, TESTING) - I reviewed patient records, labs, notes, testing and imaging myself where available.  Lab Results  Component Value Date   WBC 6.2 06/23/2017   HGB 11.9 06/23/2017   HCT 33.4 (L) 06/23/2017   MCV 86 06/23/2017   PLT 167 06/23/2017      Component Value Date/Time   NA 140 06/23/2017 0950   K 3.7 06/23/2017 0950   CL 103 06/23/2017 0950   CO2 21 06/23/2017 0950   GLUCOSE 105 (H) 06/23/2017 0950   GLUCOSE 100 (H) 03/20/2017 1646   BUN 22 06/23/2017 0950   CREATININE 1.07 (H) 06/23/2017 0950   CALCIUM 9.9 06/23/2017 0950   PROT 7.5 01/31/2017 1639   ALBUMIN 4.2 01/31/2017 1639   AST 11 01/31/2017 1639   ALT 9 01/31/2017 1639   ALKPHOS 51 01/31/2017 1639   BILITOT 2.1 (H) 01/31/2017 1639   GFRNONAA 53 (L) 06/23/2017 0950   GFRAA 62 06/23/2017 0950   Lab Results  Component Value Date   CHOL 176 08/21/2015   HDL 46 08/21/2015   LDLCALC 95 08/21/2015   LDLDIRECT 81.0 05/02/2014   TRIG 175 (H) 08/21/2015   CHOLHDL 3.8 08/21/2015   Lab Results  Component Value Date   HGBA1C 5.1 08/21/2015   Lab Results  Component Value Date   VITAMINB12 287 03/20/2017   Lab Results  Component Value Date   TSH 0.17 (L) 03/20/2017    03/20/17 xray cervical [I reviewed images myself and agree with interpretation. -[facility]]  - Degenerative change without acute abnormality.  ASSESSMENT AND PLAN  67 y.o. year old female here with progressively worsening hand numbness for past 1  year, intermittent lower extremity numbness and pain for past 4 to 6 months, right knee pain.    Dx:  1. Hand numbness   2. Leg numbness   3. Chronic bilateral low back pain without sciatica   4. Neck pain      PLAN:  HAND NUMBNESS (CTS vs cervical radiculopathy) - EMG/NCS - refer to OT for conservative mgmt; then may consider MRI cervical spine  LEG NUMBNESS (? Lumbar spinal stenosis) - EMG/NCS - refer to PT for conservative mgmt; if not improving, then will check MRI lumbar spine  Orders Placed This Encounter  Procedures  . Ambulatory referral to Physical Therapy  . Ambulatory referral to Occupational Therapy  . NCV with EMG(electromyography)   Return for for NCV/EMG.    Penni Bombard, MD 5/69/7948, 01:65 AM Certified in Neurology, Neurophysiology and Neuroimaging  [facility] [address] [place], [place] [address] [phone]

## 2017-08-03 IMAGING — MR MR HEAD W/O CM
8 of 10 series · 35 of 48 positions shown · non-contrast
Comparison: Prior CT from 08/20/2015.

CLINICAL DATA: Initial evaluation for acute encephalopathy.

EXAM:
MRI HEAD WITHOUT CONTRAST
TECHNIQUE: Multiplanar, multiecho pulse sequences of the brain and surrounding
structures were obtained without intravenous contrast.

[Series 3: T1 · sagittal · 5.0mm · 0.47mm/px · 2 of 25 slices shown]
[im 1/25]
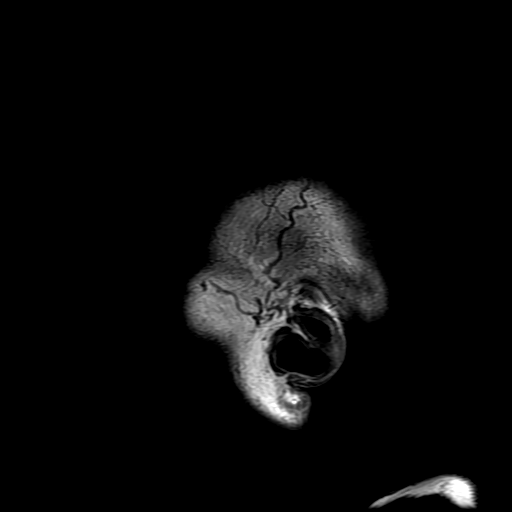
[im 25/25]
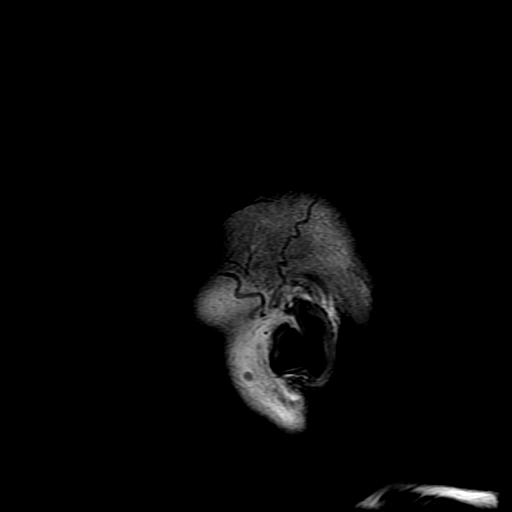

[Series 4: DWI · axial · 3.0mm · 1.02mm/px · z∈[-20,+123]mm · 9 of 98 slices shown (1 of 4)]
[im 1/98]
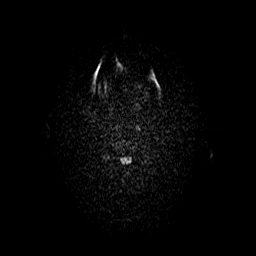
[im 13/98]
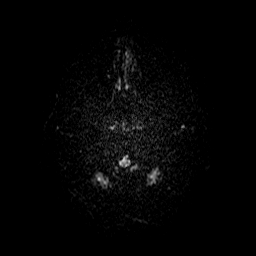
[im 25/98]
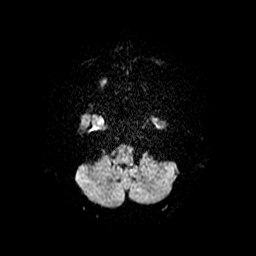
[im 37/98]
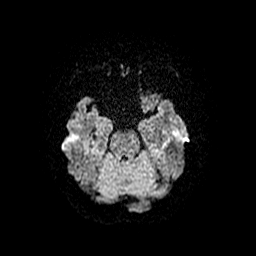
[im 49/98]
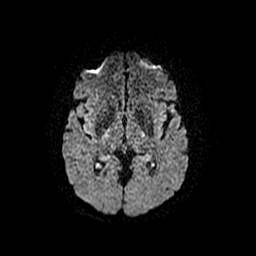
[im 61/98]
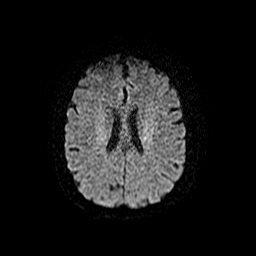
[im 73/98]
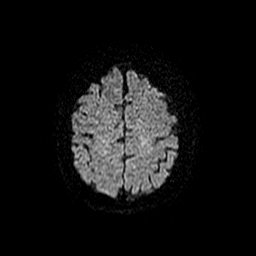
[im 85/98]
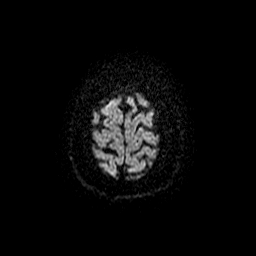
[im 98/98]
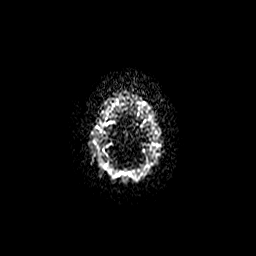

[Series 5: DWI · coronal · 5.0mm · 1.02mm/px · 7 of 66 slices shown (2 of 4)]
[im 1/66]
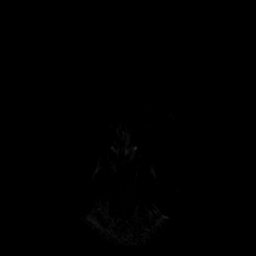
[im 11/66]
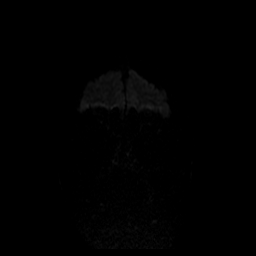
[im 22/66]
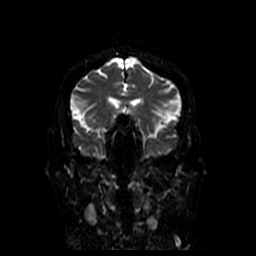
[im 33/66]
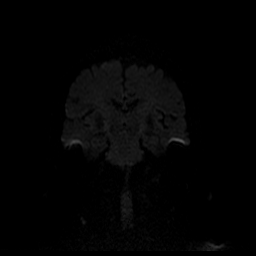
[im 44/66]
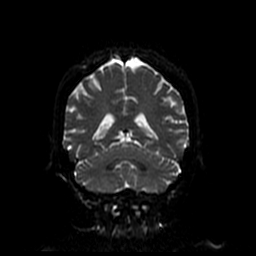
[im 55/66]
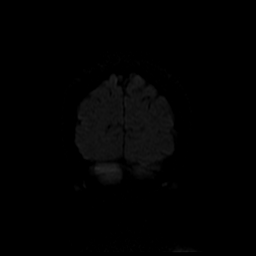
[im 66/66]
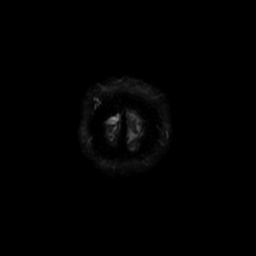

[Series 6: T2 · axial · 5.0mm · 0.43mm/px · z∈[-21,+123]mm · 3 of 25 slices shown]
[im 1/25]
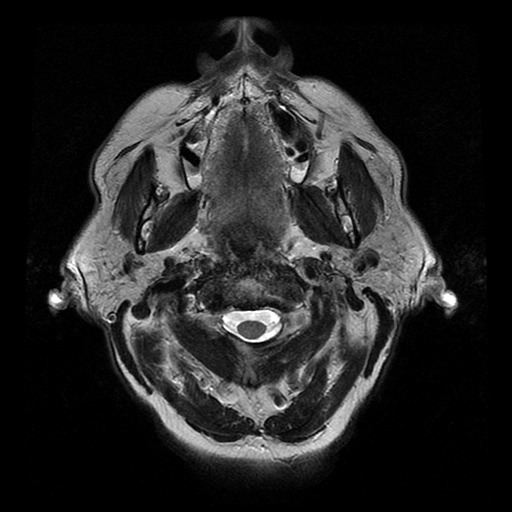
[im 13/25]
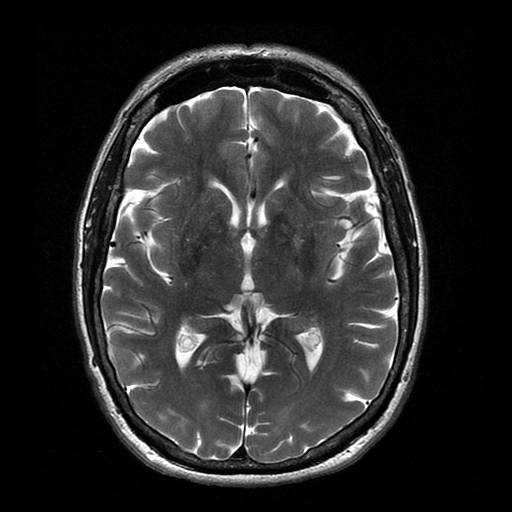
[im 25/25]
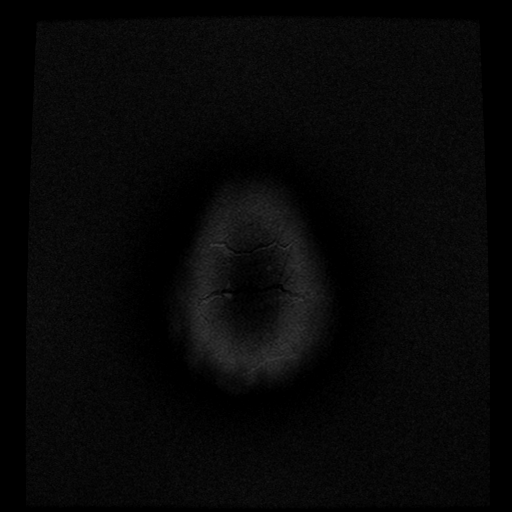

[Series 7: FLAIR · axial · 5.0mm · 0.43mm/px · z∈[-21,+123]mm · 3 of 25 slices shown]
[im 1/25]
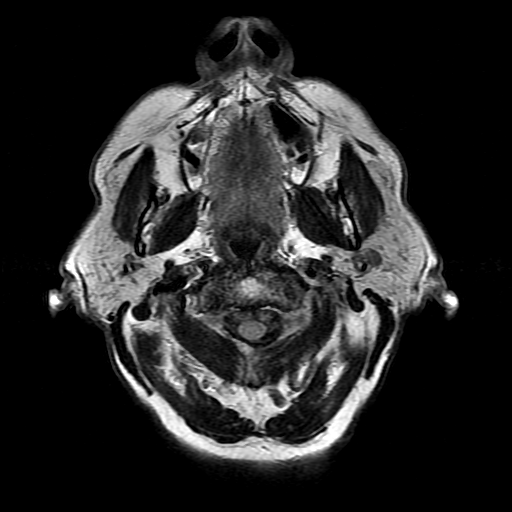
[im 13/25]
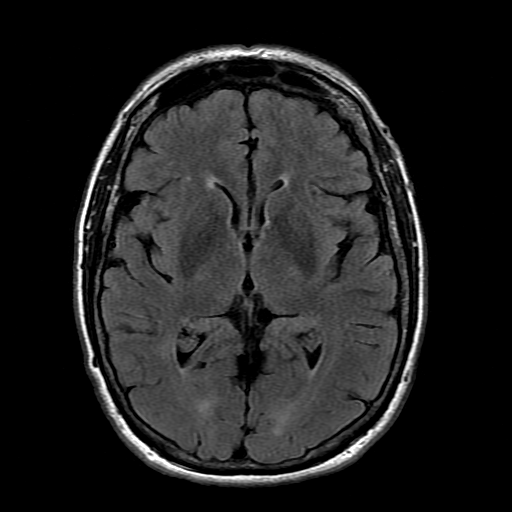
[im 25/25]
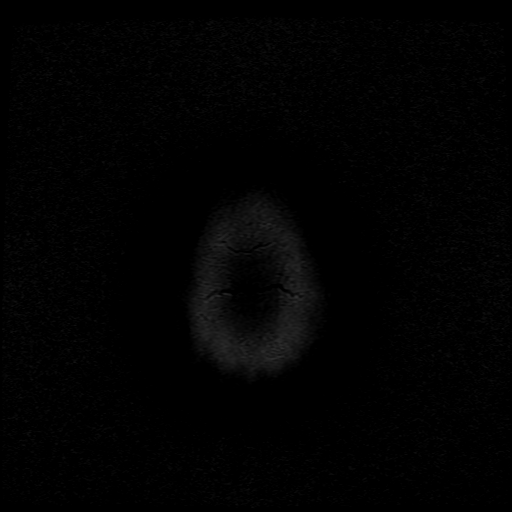

[Series 10: T2 post-contrast · coronal · 5.0mm · 0.51mm/px · 3 of 33 slices shown]
[im 1/33]
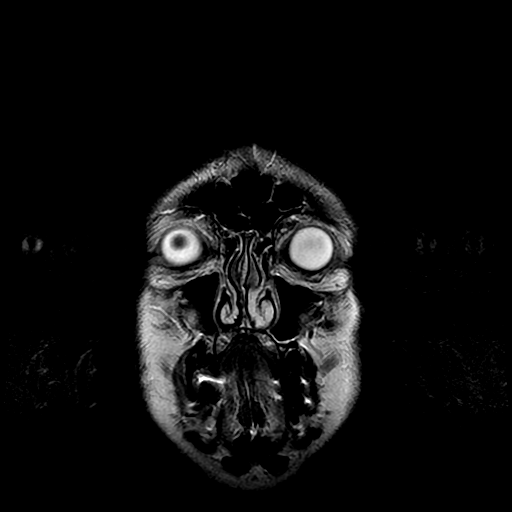
[im 17/33]
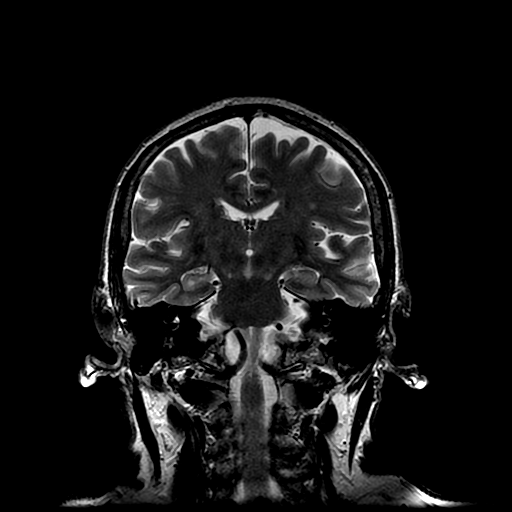
[im 33/33]
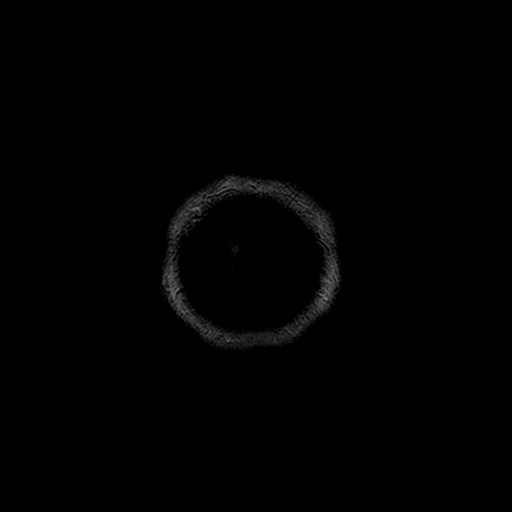

[Series 400: DWI · axial · 3.0mm · 1.02mm/px · z∈[-20,+120]mm · 5 of 48 slices shown (3 of 4)]
[im 1/48]
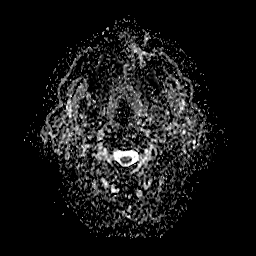
[im 12/48]
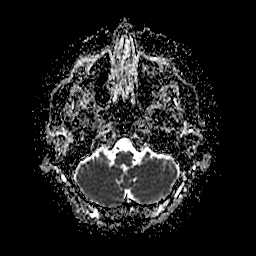
[im 24/48]
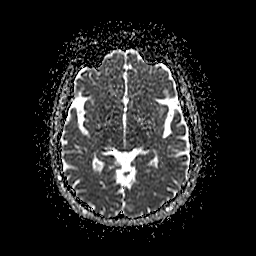
[im 36/48]
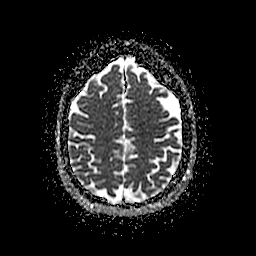
[im 48/48]
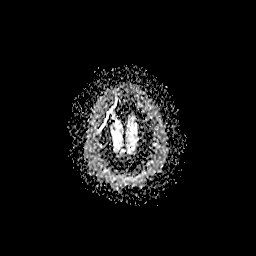

[Series 500: DWI · coronal · 5.0mm · 1.02mm/px · 3 of 33 slices shown (4 of 4)]
[im 1/33]
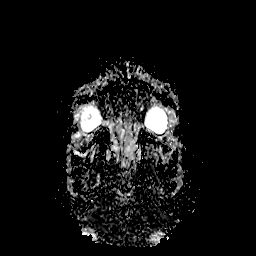
[im 17/33]
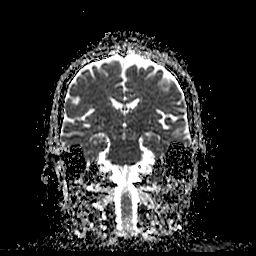
[im 33/33]
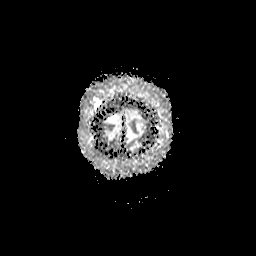

[35 of 48 positions shown; findings below may reference images not displayed]

FINDINGS: Age appropriate cerebral volume loss present. Minimal patchy
T2/FLAIR hyperintensity within the periventricular and deep white
matter both cerebral hemispheres, nonspecific, but felt to be within
normal limits for patient age.

No abnormal foci of restricted diffusion to suggest acute ischemia.
Gray-white matter differentiation maintained. Major intracranial
vascular flow voids are preserved. No acute or chronic intracranial
hemorrhage. No areas of chronic infarction.

No mass lesion, midline shift, or mass effect. No hydrocephalus. No
extra-axial fluid collection. Major dural sinuses are grossly
patent.

Craniocervical junction within normal limits. Scattered multilevel
degenerative spondylolysis noted within the visualized upper
cervical spine, most prevalent at C4-5. Resultant mild stenosis at
this level.

Pituitary gland within normal limits. No acute abnormality about the
globes and orbits.

Mild scattered mucosal thickening within the ethmoidal air cells and
maxillary sinuses. Paranasal sinuses are otherwise clear. No mastoid
effusion. Inner ear structures normal.

Bone marrow signal intensity within normal limits. No scalp soft
tissue abnormality.
IMPRESSION: Normal brain MRI for patient age. No acute intracranial process
identified.

## 2017-08-05 ENCOUNTER — Encounter: Payer: Self-pay | Admitting: Physical Therapy

## 2017-08-05 ENCOUNTER — Ambulatory Visit: Payer: PPO | Attending: Dentistry | Admitting: Physical Therapy

## 2017-08-05 ENCOUNTER — Ambulatory Visit: Payer: PPO | Admitting: Occupational Therapy

## 2017-08-05 DIAGNOSIS — M6281 Muscle weakness (generalized): Secondary | ICD-10-CM | POA: Diagnosis not present

## 2017-08-05 DIAGNOSIS — R278 Other lack of coordination: Secondary | ICD-10-CM | POA: Diagnosis not present

## 2017-08-05 DIAGNOSIS — R208 Other disturbances of skin sensation: Secondary | ICD-10-CM | POA: Insufficient documentation

## 2017-08-05 DIAGNOSIS — M25612 Stiffness of left shoulder, not elsewhere classified: Secondary | ICD-10-CM

## 2017-08-05 DIAGNOSIS — M79605 Pain in left leg: Secondary | ICD-10-CM

## 2017-08-05 DIAGNOSIS — R293 Abnormal posture: Secondary | ICD-10-CM | POA: Diagnosis not present

## 2017-08-05 DIAGNOSIS — M25512 Pain in left shoulder: Secondary | ICD-10-CM | POA: Diagnosis not present

## 2017-08-05 DIAGNOSIS — M79604 Pain in right leg: Secondary | ICD-10-CM

## 2017-08-05 NOTE — Therapy (Signed)
[facility] [address] [place], [place], [address] Phone: [phone]   Fax:  [phone]  Occupational Therapy Evaluation  Patient Details  Name: Anna Marquez MRN: [id number] Date of Birth: [birth year] Referring Provider: Leta Baptist   Encounter Date: 08/05/2017  OT End of Session - 08/05/17 1341    Visit Number  1    Number of Visits  17    Date for OT Re-Evaluation  10/06/17 However, may extend date if pt is placed on hold awaiting imaging results    Authorization Type  [employer]    Authorization - Visit Number  1    Authorization - Number of Visits  10    OT Start Time  1145    OT Stop Time  1230    OT Time Calculation (min)  45 min    Activity Tolerance  Patient tolerated treatment well    Behavior During Therapy  [facility] for tasks assessed/performed       Past Medical History:  Diagnosis Date  . Adenomatous colon polyp 2003  . Cholelithiasis   . Diverticulosis of colon   . Gastric polyps   . GERD (gastroesophageal reflux disease)   . Helicobacter pylori (H. pylori)   . Hiatal hernia   . Hypertension   . Mitral valve prolapse   . Nephrolithiasis   . Osteoarthritis   . Osteopenia 03/2017   T score -1.5 FRAX 8.9% / 0.9%.  Stable from prior DEXA 2016  . TGA (transient global amnesia)     Past Surgical History:  Procedure Laterality Date  . ANTERIOR AND POSTERIOR REPAIR N/A 06/14/2014   Procedure: ANTERIOR (CYSTOCELE) AND POSTERIOR REPAIR (RECTOCELE);  Surgeon: Anastasio Auerbach, MD;  Location: [facility];  Service: Gynecology;  Laterality: N/A;  . CHOLECYSTECTOMY    . kidney stone removal     x2  . VAGINAL HYSTERECTOMY N/A 06/14/2014   Procedure: HYSTERECTOMY VAGINAL;  Surgeon: Anastasio Auerbach, MD;  Location: [facility];  Service: Gynecology;  Laterality: N/A;    There were no vitals filed for this visit.  Subjective Assessment - 08/05/17 1152    Pertinent History  bone spurs cervical neck, OA  bilateral hands    Limitations  none    Currently in Pain?  Yes    Pain Score  5     Pain Location  Shoulder    Pain Orientation  Left    Pain Descriptors / Indicators  Shooting    Pain Type  Chronic pain    Pain Radiating Towards  radiating into Lt arm/hand    Pain Frequency  Constant fluctuates in intensity    Aggravating Factors   worse at night, moving the shoulder joint    Pain Relieving Factors  repositioning        [facility] OT Assessment - 08/05/17 1158      Assessment   Medical Diagnosis  bilateral hand numbness, Lt shoulder pain    Referring Provider  Penumalli    Onset Date/Surgical Date  -- Nov 2018    Hand Dominance  Right      Precautions   Precautions  None      Balance Screen   Has the patient fallen in the past 6 months  No    Has the patient had a decrease in activity level because of a fear of falling?   No    Is the patient reluctant to leave their home because of a fear of falling?   No  Home  Environment   Bathroom Shower/Tub  Tub/Shower unit    Additional Comments  Pt lives in 1 level home with 3 steps to enter.     Lives With  Alone      Prior Function   Level of Independence  Independent    Vocation  Retired    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 Pt has to feed mother as well. Pt also has 65 year old grandson    Leisure  Enjoyed going to gym, walking her dog (2-3 miles) per day; has stationary bike and treadmill; has 69-year old grandson, crocheting, reading      ADL   Eating/Feeding  Independent    Grooming  Modified independent but cannot assist w/ LUE    Upper Body Bathing  Modified independent    Lower Body Bathing  Modified independent    Upper Body Dressing  Increased time Compensations for hooking bra d/t Lt sh    Lower Body Dressing  Independent    Toilet Transfer  Modified independent    Toileting - Clothing Manipulation  Increased time;Modified independent     Toileting -  Control and instrumentation engineer  Independent    ADL comments  Pt has to compensate w/ ADL tasks (especially high level, or task that require ER/IR) Lt non dominant UE - Pt using RUE only for these tasks.       IADL   Shopping  Takes care of all shopping needs independently    Light Housekeeping  Performs light daily tasks but cannot maintain acceptable level of cleanliness d/t Lt shoulder pain/decr. ROM    Meal Prep  Plans, prepares and serves adequate meals independently    Investment banker, corporate own vehicle      Mobility   Mobility Status  Independent      Written Expression   Dominant Hand  Right    Handwriting  -- Denies changes      Vision - History   Baseline Vision  Wears contact      Observation/Other Assessments   Observations  Noted OA Bilateral hands, worse Rt hand, especially at DIP joints and swollen joints Rt hand MP joints. Pt does have milder joint deformity Lt hand      Sensation   Light Touch  Appears Intact    Additional Comments  Pt reports parathesias/numbness/tingling bilateral hands - worse when pt first wakes up, but gets better throughtout day. Pt reports she can feel touch, hot/cold, but it just feels like it's "waking up"      Coordination   9 Hole Peg Test  Right;Left    Right 9 Hole Peg Test  24.25 sec. (slightly slower d/t OA)    Left 9 Hole Peg Test  30.35 sec. (slower d/t OA, decr. in hand manipulation, more drops Lt hand) denies inability to feel pegs both hands      Edema   Edema  Rt hand joint swelling d/t OA      ROM / Strength   AROM / PROM / Strength  AROM      AROM   Overall AROM Comments  RUE AROM WNL's. LUE: shoulder flex = 118* w/ pain 8/10, sh. abd = 78* w/ pain 8/10, ER approx. 75% starting w/ bent elbow to lift over head (ER from bent elbow low range, only 50%), IR approx. 50% w/ max pain (9-10/10). Lt elbow distally WNL's  Hand Function   Right Hand Grip (lbs)  59 LBS    Left Hand Grip (lbs)  58  LBS                        OT Short Term Goals - 08/05/17 1354      OT SHORT TERM GOAL #1   Title  Pt independent with HEP for LUE ROM - 09/05/17    Time  4    Period  Weeks    Status  New      OT SHORT TERM GOAL #2   Title  Pt to verbalize understanding with proper positioning and other pain management strategies for Lt shoulder    Time  4    Period  Weeks    Status  New      OT SHORT TERM GOAL #3   Title  Pt to be independent with coordination HEP bilateral hands    Time  4    Period  Weeks    Status  New      OT SHORT TERM GOAL #4   Title  Pt to verbalize understanding with safety considerations d/t lack of sensation bilateral hands    Time  4    Period  Weeks    Status  New        OT Long Term Goals - 08/05/17 1355      OT LONG TERM GOAL #1   Title  Independent with updated HEP Lt shoulder - due 10/06/17 (however, may extend date if placed on hold)     Time  8    Period  Weeks    Status  New      OT LONG TERM GOAL #2   Title  Pt to use LUE as assist for all BADLS (brushing/styling hair, assisting to hook bra, etc)    Time  8    Period  Weeks    Status  New      OT LONG TERM GOAL #3   Title  Pt to demo shoulder flexion to 130* w/ pain less than or equal to 5/10 for light overhead reaching    Baseline  115*    Time  8    Period  Weeks    Status  New      OT LONG TERM GOAL #4   Title  Pt to demo sufficient ER/IR to LUE to assist in washing hair and hooking bra    Time  8    Period  Weeks    Status  New            Plan - 08/05/17 1347    Clinical Impression Statement  Pt is a 67 y.o. female who presents to outpatient for O.T. services d/t bilateral hand numbness, Lt shoulder pain. Pt reports bilateral hand numbness has been going on for approx 1 year. Pt reports bone spurs in cervical spine, but has not yet received MRI. Pt also has pain and limited motion Lt shoulder with movement, then sore after movement. Pt with mild coordination  deficits both hands but most likely d/t OA in hands.    Occupational Profile and client history currently impacting functional performance  bone spurs in neck, OA bilateral hands, parathesias in bilateral hands. Current shoulder pain and weakness affecting ability to perform ADLS    Occupational performance deficits (Please refer to evaluation for details):  ADL's;IADL's    Rehab Potential  Fair    Current Impairments/barriers  affecting progress:  unknown etiology - no imaging done as of evaluation    OT Frequency  2x / week    OT Duration  8 weeks however may place on hold after 3-4 weeks to await imaging results if no further progress    OT Treatment/Interventions  Self-care/ADL training;Moist Heat;Fluidtherapy;DME and/or AE instruction;Therapeutic activities;Therapeutic exercise;Neuromuscular education;Passive range of motion;Patient/family education;Manual Therapy    Plan  high level coordination HEP for bilateral hands (Lt needs more), begin ROM and gentle HEP for LT shoulder as tolerated (try supine for flexion, ER/IR as tolerated), low range posterior sh. strengthening w/ theraband as tolerated.     Clinical Decision Making  Several treatment options, min-mod task modification necessary    Consulted and Agree with Plan of Care  Patient       Patient will benefit from skilled therapeutic intervention in order to improve the following deficits and impairments:  Decreased coordination, Decreased range of motion, Improper body mechanics, Impaired sensation, Impaired UE functional use, Pain, Decreased strength  Visit Diagnosis: Left shoulder pain, unspecified chronicity - Plan: Ot plan of care cert/re-cert  Stiffness of left shoulder, not elsewhere classified - Plan: Ot plan of care cert/re-cert  Other disturbances of skin sensation - Plan: Ot plan of care cert/re-cert  Other lack of coordination - Plan: Ot plan of care cert/re-cert    Problem List Patient Active Problem List    Diagnosis Date Noted  . Palpitations 01/31/2017  . Cough 11/21/2016  . TIA (transient ischemic attack) 03/07/2016  . Essential hypertension 01/27/2012  . Migraine without aura 05/20/2008  . Allergic rhinitis 05/20/2008  . Mitral regurgitation 08/10/2007  . GERD 08/10/2007  . Diaphragmatic hernia 08/10/2007  . DIVERTICULOSIS, COLON 08/10/2007  . Osteoarthritis 08/10/2007  . COLONIC POLYPS, ADENOMATOUS, HX OF 08/10/2007    Carey Bullocks, OTR/L 08/05/2017, 2:04 PM  [facility] [address] [place], [place], [address] Phone: [phone]   Fax:  [phone]  Name: Anna Marquez MRN: [id number] Date of Birth: 06-Mar-1950

## 2017-08-07 ENCOUNTER — Ambulatory Visit: Payer: PPO | Admitting: Occupational Therapy

## 2017-08-07 ENCOUNTER — Ambulatory Visit: Payer: PPO | Admitting: Physical Therapy

## 2017-08-07 ENCOUNTER — Encounter: Payer: Self-pay | Admitting: Physical Therapy

## 2017-08-07 NOTE — Therapy (Signed)
[facility] [address] [place], [place], [address] Phone: [phone]   Fax:  [phone]  Physical Therapy Evaluation  Patient Details  Name: Anna Marquez MRN: [id number] Date of Birth: August 01, 1950 Referring Provider: Leta Baptist   Encounter Date: 08/05/2017  PT End of Session - 08/07/17 0752    Visit Number  1    Number of Visits  10    Date for PT Re-Evaluation  10/04/17    Authorization Type  [employer]-will need Progress note every 10th visit    PT Start Time  1104    PT Stop Time  1146    PT Time Calculation (min)  42 min    Activity Tolerance  Patient tolerated treatment well    Behavior During Therapy  [facility] for tasks assessed/performed       Past Medical History:  Diagnosis Date  . Adenomatous colon polyp 2003  . Cholelithiasis   . Diverticulosis of colon   . Gastric polyps   . GERD (gastroesophageal reflux disease)   . Helicobacter pylori (H. pylori)   . Hiatal hernia   . Hypertension   . Mitral valve prolapse   . Nephrolithiasis   . Osteoarthritis   . Osteopenia 03/2017   T score -1.5 FRAX 8.9% / 0.9%.  Stable from prior DEXA 2016  . TGA (transient global amnesia)     Past Surgical History:  Procedure Laterality Date  . ANTERIOR AND POSTERIOR REPAIR N/A 06/14/2014   Procedure: ANTERIOR (CYSTOCELE) AND POSTERIOR REPAIR (RECTOCELE);  Surgeon: Anastasio Auerbach, MD;  Location: [facility];  Service: Gynecology;  Laterality: N/A;  . CHOLECYSTECTOMY    . kidney stone removal     x2  . VAGINAL HYSTERECTOMY N/A 06/14/2014   Procedure: HYSTERECTOMY VAGINAL;  Surgeon: Anastasio Auerbach, MD;  Location: [facility];  Service: Gynecology;  Laterality: N/A;    There were no vitals filed for this visit.   Subjective Assessment - 08/07/17 0738    Subjective  Pt reports having tingling and burning/numbness in the tops of legs, tingling and burning in hands at night.  Also having pain in L arm.  Reports back pain.   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  However, these pains are more constant; started around the holidays.  Is not able to move mother by herself; has difficulty with sweeping and vacuuming now.    Pertinent History  x-ray of cervical spine; scheduled on 08/14/17 for EMG/nerve testing    Patient Stated Goals  Pt's goal for therapy is to be able to use arm more and just figure out what is going on to be able to sleep, move better.    Currently in Pain?  Yes    Pain Score  5     Pain Location  Shoulder into L arm    Pain Orientation  Left    Pain Descriptors / Indicators  Shooting    Pain Type  Chronic pain    Pain Radiating Towards  radiating into L arm/hand    Pain Onset  More than a month ago 3 months    Pain Frequency  Constant    Aggravating Factors   worse at night, with movement    Pain Relieving Factors  repositioning    Effect of Pain on Daily Activities  difficulty with shampooing hair, hooking bra, reaching activities (PT will monitor shoulder pain, but OT will likely address)  Multiple Pain Sites  Yes    Pain Score  2    Pain Location  Leg thighs    Pain Orientation  Right;Left;Anterior    Pain Descriptors / Indicators  Numbness;Tingling;Burning    Pain Type  Chronic pain    Pain Onset  More than a month ago started at Thanksgiving 2018    Pain Frequency  Intermittent    Aggravating Factors   standing and walking too long    Pain Relieving Factors  sitting    Effect of Pain on Daily Activities  difficulty with walking, standing, changing/caregiving for mother         [facility] PT Assessment - 08/07/17 0745      Assessment   Medical Diagnosis  bilateral lower extremity pain    Referring Provider  Penumalli    Onset Date/Surgical Date  -- November 2018    Hand Dominance  Right      Precautions   Precautions  None      Balance Screen   Has the patient fallen in the past 6 months  No    Has the patient  had a decrease in activity level because of a fear of falling?   No    Is the patient reluctant to leave their home because of a fear of falling?   No      Home Environment   Living Environment  Private residence    Living Arrangements  Alone    Available Help at Discharge  Family    Type of [place] to enter [place]  [place]  One level    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, who is dependent in care (Hoyerlift)    Leisure  Enjoyed going to gym, walking her dog (2-3 miles) per day; has stationary bike and treadmill; has 49-year old grandson      Observation/Other Assessments   Focus on Therapeutic Outcomes (FOTO)   NA    Oswestry Disability Index   16% disability:  Pain prevents lifting weights from floor, pain prevents standing for more than 1/2 hour, pain disturbs sleep      Sensation   Light Touch  Appears Intact      Functional Tests   Functional tests  Other      Other:   Other/ Comments  Repeated forward bending x 5 reps, no increase in back/leg pain; repeated lumbar extension x 5 reps, increased back/leg pain from 1/10 to 3/10 (returned to 1/10 after 3 additional reps of repeated lumbar flexion)      Posture/Postural Control   Posture/Postural Control  Postural limitations    Postural Limitations  Forward head;Rounded Shoulders      ROM / Strength   AROM / PROM / Strength  Strength      Strength   Right Hip Flexion  4+/5    Left Hip Flexion  4+/5    Right Knee Flexion  4+/5    Right Knee Extension  5/5    Left Knee Flexion  4+/5    Left Knee Extension  5/5  Right Ankle Dorsiflexion  4/5    Left Ankle Dorsiflexion  4/5      Transfers    Transfers  Sit to Stand;Stand to Sit    Sit to Stand  6: Modified independent (Device/Increase time);Without upper extremity assist;From chair/3-in-1    Five time sit to stand comments   17.72 pain in bilateral knees    Stand to Sit  6: Modified independent (Device/Increase time);Without upper extremity assist;To chair/3-in-1      Ambulation/Gait   Ambulation/Gait  Yes    Ambulation/Gait Assistance  7: Independent    Ambulation Distance (Feet)  100 Feet    Assistive device  None    Gait Pattern  Within Functional Limits    Ambulation Surface  Level;Indoor    Gait velocity  10 sec = 3.2 ft/sec                Objective measurements completed on examination: See above findings.                   PT Long Term Goals - 08/07/17 1022      PT LONG TERM GOAL #1   Title  Pt will be independent with HEP for improved pain and improved functional mobility tolerance.  TARGET 09/05/17    Time  5    Period  Weeks    Status  New    Target Date  09/05/17      PT LONG TERM GOAL #2   Title  Pt will verbalize understanding of proper posture/body mechanics with lifting, ADLs, for decreased pain with ADLs and functional mobility.     Time  5    Period  Weeks    Status  New    Target Date  09/05/17      PT LONG TERM GOAL #3   Title  Pt will improve 5x sit<>stand to less than or equal to 12 seconds for improved transfer efficiency and safety.    Time  5    Period  Weeks    Status  New    Target Date  09/05/17      PT LONG TERM GOAL #4   Title  Pt will improve Oswestry Questionnaire to less than or equal to 10% for improved pain free standing, walking, sleeping activities.    Time  5    Period  Weeks    Status  New    Target Date  09/05/17      PT LONG TERM GOAL #5   Title  6MWT to be assessed, with goal to be written as appropriate.    Time  5    Period  Weeks    Status  New    Target Date  09/05/17             Plan - 08/07/17 0753    Clinical Impression  Statement  Pt is a 67 year old female who presents to OP PT with reports of back and bilateral thigh pain, bilateral UE numbess/tingling, shoulder pain (to be addressed by OT).  Pain is limiting functional mobility including slowed transfers, decreased tolerance for standing and gait, and difficulty with lifting tasks.  Pt would benefit from skilled PT to address the above stated deficits to decrease pain and improve functional mobility participation.    History and Personal Factors relevant to plan of care:  pain sensations starting 11/2016 and progressively worsening per patient report, affecting sleep, functional activities; pt has not yet had spine imaging  Clinical Presentation  Stable    Clinical Presentation due to:  Awaiting further tests; pain affecting functional mobility and daily tasks    Clinical Decision Making  Low    Rehab Potential  Good    PT Frequency  Other (comment) 1x/wk for 1 week, then 2x/wk for 4 weeks    PT Duration  Other (comment) plus eval    PT Treatment/Interventions  ADLs/Self Care Home Management;Electrical Stimulation;Moist Heat;Traction;Ultrasound;Therapeutic exercise;Therapeutic activities;Gait training;Patient/family education;Manual techniques    PT Next Visit Plan  Initiate HEP for lumbar exercises-flexibility and strengthening; perform 6MWT and goal to be written as appropriate; postural education/body mechanics with ADLs    Consulted and Agree with Plan of Care  Patient       Patient will benefit from skilled therapeutic intervention in order to improve the following deficits and impairments:  Decreased activity tolerance, Decreased strength, Difficulty walking, Pain  Visit Diagnosis: Pain in right leg  Pain in left leg  Muscle weakness (generalized)  Abnormal posture     Problem List Patient Active Problem List   Diagnosis Date Noted  . Palpitations 01/31/2017  . Cough 11/21/2016  . TIA (transient ischemic attack) 03/07/2016  . Essential  hypertension 01/27/2012  . Migraine without aura 05/20/2008  . Allergic rhinitis 05/20/2008  . Mitral regurgitation 08/10/2007  . GERD 08/10/2007  . Diaphragmatic hernia 08/10/2007  . DIVERTICULOSIS, COLON 08/10/2007  . Osteoarthritis 08/10/2007  . COLONIC POLYPS, ADENOMATOUS, HX OF 08/10/2007    Anna Marquez W. 08/07/2017, 10:33 AM  Frazier Butt., PT   [facility] [address] [place], [place], [address] Phone: [phone]   Fax:  [phone]  Name: Anna Marquez MRN: [id number] Date of Birth: [birth year]

## 2017-08-13 ENCOUNTER — Encounter: Payer: Self-pay | Admitting: Physical Therapy

## 2017-08-13 ENCOUNTER — Ambulatory Visit: Payer: PPO | Attending: Dentistry | Admitting: Physical Therapy

## 2017-08-13 ENCOUNTER — Ambulatory Visit: Payer: PPO | Admitting: Occupational Therapy

## 2017-08-13 DIAGNOSIS — M6281 Muscle weakness (generalized): Secondary | ICD-10-CM | POA: Insufficient documentation

## 2017-08-13 DIAGNOSIS — M25612 Stiffness of left shoulder, not elsewhere classified: Secondary | ICD-10-CM

## 2017-08-13 DIAGNOSIS — R208 Other disturbances of skin sensation: Secondary | ICD-10-CM | POA: Insufficient documentation

## 2017-08-13 DIAGNOSIS — R293 Abnormal posture: Secondary | ICD-10-CM | POA: Insufficient documentation

## 2017-08-13 DIAGNOSIS — M25512 Pain in left shoulder: Secondary | ICD-10-CM | POA: Insufficient documentation

## 2017-08-13 DIAGNOSIS — M79605 Pain in left leg: Secondary | ICD-10-CM | POA: Insufficient documentation

## 2017-08-13 DIAGNOSIS — M79604 Pain in right leg: Secondary | ICD-10-CM | POA: Insufficient documentation

## 2017-08-13 DIAGNOSIS — R278 Other lack of coordination: Secondary | ICD-10-CM | POA: Diagnosis present

## 2017-08-13 NOTE — Therapy (Signed)
[facility] [address] [place], [place], [address] Phone: [phone]   Fax:  [phone]  Physical Therapy Treatment  Patient Details  Name: Anna Marquez MRN: [id number] Date of Birth: 10/04/1950 Referring Provider: Leta Baptist   Encounter Date: 08/13/2017  PT End of Session - 08/13/17 1452    Visit Number  2    Number of Visits  10    Date for PT Re-Evaluation  10/04/17    Authorization Type  [employer]-will need Progress note every 10th visit    PT Start Time  1400    PT Stop Time  1445    PT Time Calculation (min)  45 min    Activity Tolerance  Patient tolerated treatment well       Past Medical History:  Diagnosis Date  . Adenomatous colon polyp 2003  . Cholelithiasis   . Diverticulosis of colon   . Gastric polyps   . GERD (gastroesophageal reflux disease)   . Helicobacter pylori (H. pylori)   . Hiatal hernia   . Hypertension   . Mitral valve prolapse   . Nephrolithiasis   . Osteoarthritis   . Osteopenia 03/2017   T score -1.5 FRAX 8.9% / 0.9%.  Stable from prior DEXA 2016  . TGA (transient global amnesia)     Past Surgical History:  Procedure Laterality Date  . ANTERIOR AND POSTERIOR REPAIR N/A 06/14/2014   Procedure: ANTERIOR (CYSTOCELE) AND POSTERIOR REPAIR (RECTOCELE);  Surgeon: Anastasio Auerbach, MD;  Location: [facility];  Service: Gynecology;  Laterality: N/A;  . CHOLECYSTECTOMY    . kidney stone removal     x2  . VAGINAL HYSTERECTOMY N/A 06/14/2014   Procedure: HYSTERECTOMY VAGINAL;  Surgeon: Anastasio Auerbach, MD;  Location: [facility];  Service: Gynecology;  Laterality: N/A;    There were no vitals filed for this visit.  Subjective Assessment - 08/13/17 1537    Subjective  Pt reports having tingling and burning/numbness in the tops of legs, tingling and burning in hands at night.  Also having pain in L arm.  Reports back pain.  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  However, these pains are more constant; started around the holidays.  Is not able to move mother by herself; has difficulty with sweeping and vacuuming now.   She is concered about some type of systemic problem; she stated her mother has peripheral neuropathy that has not definitive dx and she is now complete care;     Pertinent History  x-ray of cervical spine; scheduled on 08/14/17 for EMG/nerve testing;  MD told her that insurace requried 30 day of rehab before MRI image could be done    Patient Stated Goals  Pt's goal for therapy is to be able to use arm more and just figure out what is going on to be able to sleep, move better.    Currently in Pain?  Yes    Pain Score  5     Pain Location  Arm    Pain Orientation  Left    Pain Descriptors / Indicators  Sharp;Shooting    Pain Type  Chronic pain    Pain Radiating Towards  down the left arm to hand    Pain Onset  More than a month ago    Pain Frequency  Intermittent    Multiple Pain Sites  Yes    Pain Score  3    Pain  Orientation  Right;Anterior    Pain Descriptors / Indicators  Numbness;Tingling    Pain Type  Chronic pain    Pain Onset  More than a month ago    Pain Frequency  Intermittent    Aggravating Factors   standing and some neck movt    Pain Relieving Factors  lying supine     Effect of Pain on Daily Activities  affects IADLS and ADLS/ walking         Therapeutic exercise Supine; snow angle  ( shoulder abduction in plane of bed )  Pain free AROM only ; palms up  ISO left shoulder IR/ ER with shoulder in neutral; supine for scapula stabilization                         PT Education - 08/13/17 1542    Education Details  with [employer]; gave her HEP for neutral shoulder ER/IR isometrics; and supine neck retraction          PT Long Term Goals - 08/07/17 1022      PT LONG TERM GOAL #1   Title  Pt will be independent with HEP for improved pain and improved  functional mobility tolerance.  TARGET 09/05/17    Time  5    Period  Weeks    Status  New    Target Date  09/05/17      PT LONG TERM GOAL #2   Title  Pt will verbalize understanding of proper posture/body mechanics with lifting, ADLs, for decreased pain with ADLs and functional mobility.     Time  5    Period  Weeks    Status  New    Target Date  09/05/17      PT LONG TERM GOAL #3   Title  Pt will improve 5x sit<>stand to less than or equal to 12 seconds for improved transfer efficiency and safety.    Time  5    Period  Weeks    Status  New    Target Date  09/05/17      PT LONG TERM GOAL #4   Title  Pt will improve Oswestry Questionnaire to less than or equal to 10% for improved pain free standing, walking, sleeping activities.    Time  5    Period  Weeks    Status  New    Target Date  09/05/17      PT LONG TERM GOAL #5   Title  6MWT to be assessed, with goal to be written as appropriate.    Time  5    Period  Weeks    Status  New    Target Date  09/05/17            Plan - 08/13/17 1453    Clinical Impression Statement  Spoke with OT prior to tx; OT expressed concern with possible cervical involvement of bilateal UE and LE numbness and possible left RTC pathology.  Testing RTC the subscapularis was most symptomatic vs ER's with positive impingment test; s/s are conistent with RTC pathology and she does have some explaination of mechanism of injury for the RTC with caregving for her mother and possibly walking her large dog daily;   Her neck and UE nad LE symptoms are mechanical in nature vs systemic with mvt and mobilization of C5-7 resulting in an increase in symtpoms and getting her neurtal with no pressure on the lower neck resolves her symptoms; began  some neutral spine gravity eliminated  cervial extension wiht limitation of mvt so as not to cause any radiculopathy;  she also does have positve bone spurs in the cerival spine; if she cannot keep her symtoms centralized ;  further imaging will be necessary;   manual cerivcal distraction increased lower back and arm numbness and buring;  the radicuoathy was reproducable with neck mvt distraction and C5-7 posterior to anterior glide     History and Personal Factors relevant to plan of care:  burning and numbness is bilateral and position dependent; cervical distraction increases the symptoms;     Clinical Decision Making  High    Rehab Potential  Fair    Clinical Impairments Affecting Rehab Potential  unknown source of nerve compression in c-spine     PT Frequency  1x / week    PT Treatment/Interventions  ADLs/Self Care Home Management;Electrical Stimulation;Moist Heat;Traction;Ultrasound;Therapeutic exercise;Therapeutic activities;Gait training;Patient/family education;Manual techniques    PT Next Visit Plan  follow up with the shoulder and neck exs and education for centralization of symtoms with cervical ROM       Patient will benefit from skilled therapeutic intervention in order to improve the following deficits and impairments:  Decreased activity tolerance, Decreased strength, Difficulty walking, Pain, Decreased range of motion  Visit Diagnosis: Left shoulder pain, unspecified chronicity  Muscle weakness (generalized)  Other disturbances of skin sensation  Abnormal posture     Problem List Patient Active Problem List   Diagnosis Date Noted  . Palpitations 01/31/2017  . Cough 11/21/2016  . TIA (transient ischemic attack) 03/07/2016  . Essential hypertension 01/27/2012  . Migraine without aura 05/20/2008  . Allergic rhinitis 05/20/2008  . Mitral regurgitation 08/10/2007  . GERD 08/10/2007  . Diaphragmatic hernia 08/10/2007  . DIVERTICULOSIS, COLON 08/10/2007  . Osteoarthritis 08/10/2007  . COLONIC POLYPS, ADENOMATOUS, HX OF 08/10/2007    Anna Marquez D PT DPT 08/13/2017, 3:49 PM  [facility] [address] [place], [place],  [address] Phone: [phone]   Fax:  [phone]  Name: Anna Marquez MRN: [id number] Date of Birth: 1950/05/30

## 2017-08-13 NOTE — Patient Instructions (Addendum)
  Coordination Activities  Perform the following activities for 10 minutes 1-2 times per day with both hand(s).   Rotate ball in fingertips (clockwise and counter-clockwise).  Deal cards with your thumb (Hold deck in hand and push card off top with thumb).  Pick up coins one at a time until you get 5 in your hand, then move coins from palm to fingertips to stack one at a time. Do 3 stacks of 5  Practice typing.  1. SHOULDER: Flexion - Supine (Cane)    Hold cane in both hands. Raise arms up overhead. Do not allow back to arch. Hold _2__ seconds. _10__ reps per set, _2__ sets per day  2. Abduction (Eccentric) - Active (Cane)    LAYING DOWN, Lift cane out to Lt side with Lt palm up. Avoid hiking shoulder. Keep palm relaxed. Slowly lower affected arm for 3-5 seconds. _10__ reps per set, _2__ sets per day   3. Laying down - hold dowel both palms up, elbows bent at 90 degrees, move dowel side to side as tolerated x 10 reps both sides, 2x/day.    4. Resistive Band Rowing    With resistive band anchored in door, grasp both ends. Keeping elbows bent, pull back, squeezing shoulder blades together. Hold ____ seconds. Repeat _10___ times. Do _2___ sessions per day.    5. Strengthening: Resisted Extension   Hold tubing in __Lt___ hand(s), arm forward. Pull arm back, elbow straight. DO NOT HIKE SHOULDERS Repeat _10___ times per set. Do _1-2___ sessions per day, every other day.   6. Resisted Horizontal Abduction: Bilateral   Sit or stand, tubing in both hands, arms out in front. Keeping arms straight, pinch shoulder blades together and stretch arms out. DO NOT HIKE SHOULDERS Repeat _10___ times per set. Do _1-2___ sessions per day, every other day.

## 2017-08-13 NOTE — Therapy (Signed)
[facility] [address] [place], [place], [address] Phone: [phone]   Fax:  [phone]  Occupational Therapy Treatment  Patient Details  Name: Anna Marquez MRN: [id number] Date of Birth: 04-16-1950 Referring Provider: Leta Baptist   Encounter Date: 08/13/2017  OT End of Session - 08/13/17 1404    Visit Number  2    Number of Visits  17    Date for OT Re-Evaluation  10/06/17    Authorization Type  [employer]    Authorization - Visit Number  2    Authorization - Number of Visits  10    OT Start Time  6962    OT Stop Time  1405    OT Time Calculation (min)  47 min    Activity Tolerance  Patient tolerated treatment well    Behavior During Therapy  [facility] for tasks assessed/performed       Past Medical History:  Diagnosis Date  . Adenomatous colon polyp 2003  . Cholelithiasis   . Diverticulosis of colon   . Gastric polyps   . GERD (gastroesophageal reflux disease)   . Helicobacter pylori (H. pylori)   . Hiatal hernia   . Hypertension   . Mitral valve prolapse   . Nephrolithiasis   . Osteoarthritis   . Osteopenia 03/2017   T score -1.5 FRAX 8.9% / 0.9%.  Stable from prior DEXA 2016  . TGA (transient global amnesia)     Past Surgical History:  Procedure Laterality Date  . ANTERIOR AND POSTERIOR REPAIR N/A 06/14/2014   Procedure: ANTERIOR (CYSTOCELE) AND POSTERIOR REPAIR (RECTOCELE);  Surgeon: Anastasio Auerbach, MD;  Location: [facility];  Service: Gynecology;  Laterality: N/A;  . CHOLECYSTECTOMY    . kidney stone removal     x2  . VAGINAL HYSTERECTOMY N/A 06/14/2014   Procedure: HYSTERECTOMY VAGINAL;  Surgeon: Anastasio Auerbach, MD;  Location: [facility];  Service: Gynecology;  Laterality: N/A;    There were no vitals filed for this visit.  Subjective Assessment - 08/13/17 1316    Subjective   My hands stay numb, but can get worse the more I use them; but they don't hurt. My Lt arm hurts. I go for my  nerve conduction study and the EMG tomorrow.     Pertinent History  bone spurs cervical neck, OA bilateral hands    Limitations  none    Currently in Pain?  Yes    Pain Location  Arm    Pain Orientation  Left    Pain Descriptors / Indicators  Shooting    Pain Type  Chronic pain    Pain Onset  More than a month ago    Pain Frequency  Constant    Aggravating Factors   worse at night, and w/ certain movements    Pain Relieving Factors  repositioning         Pt issued coordination and shoulder HEP - see pt instructions for details. Pt demo each x 10 reps                  OT Education - 08/13/17 1402    Education Details  Coordination HEP, Shoulder HEP    Person(s) Educated  Patient    Methods  Explanation;Demonstration;Handout;Verbal cues    Comprehension  Verbalized understanding;Returned demonstration       OT Short Term Goals - 08/13/17 1405      OT SHORT TERM GOAL #1   Title  Pt independent with HEP  for LUE ROM - 09/05/17    Time  4    Period  Weeks    Status  On-going      OT SHORT TERM GOAL #2   Title  Pt to verbalize understanding with proper positioning and other pain management strategies for Lt shoulder    Time  4    Period  Weeks    Status  On-going      OT SHORT TERM GOAL #3   Title  Pt to be independent with coordination HEP bilateral hands    Time  4    Period  Weeks    Status  On-going      OT SHORT TERM GOAL #4   Title  Pt to verbalize understanding with safety considerations d/t lack of sensation bilateral hands    Time  4    Period  Weeks    Status  New        OT Long Term Goals - 08/05/17 1355      OT LONG TERM GOAL #1   Title  Independent with updated HEP Lt shoulder - due 10/06/17 (however, may extend date if placed on hold)     Time  8    Period  Weeks    Status  New      OT LONG TERM GOAL #2   Title  Pt to use LUE as assist for all BADLS (brushing/styling hair, assisting to hook bra, etc)    Time  8    Period  Weeks     Status  New      OT LONG TERM GOAL #3   Title  Pt to demo shoulder flexion to 130* w/ pain less than or equal to 5/10 for light overhead reaching    Baseline  115*    Time  8    Period  Weeks    Status  New      OT LONG TERM GOAL #4   Title  Pt to demo sufficient ER/IR to LUE to assist in washing hair and hooking bra    Time  8    Period  Weeks    Status  New            Plan - 08/13/17 1405    Clinical Impression Statement  Pt tolerating HEP well for shoulder with some pain supine for sh. abduction and ER/IR (arms at side, elbows bent) - pt instructed to do to tolerance.     Occupational Profile and client history currently impacting functional performance  bone spurs in neck, OA bilateral hands, parathesias in bilateral hands. Current shoulder pain and weakness affecting ability to perform ADLS    Occupational performance deficits (Please refer to evaluation for details):  ADL's;IADL's    Rehab Potential  Fair    Current Impairments/barriers affecting progress:  unknown etiology - no imaging done as of evaluation    OT Frequency  2x / week    OT Duration  8 weeks    OT Treatment/Interventions  Self-care/ADL training;Moist Heat;Fluidtherapy;DME and/or AE instruction;Therapeutic activities;Therapeutic exercise;Neuromuscular education;Passive range of motion;Patient/family education;Manual Therapy    Plan  review shoulder HEP, continue progress towards goals    Consulted and Agree with Plan of Care  Patient       Patient will benefit from skilled therapeutic intervention in order to improve the following deficits and impairments:  Decreased coordination, Decreased range of motion, Improper body mechanics, Impaired sensation, Impaired UE functional use, Pain, Decreased strength  Visit Diagnosis:  Left shoulder pain, unspecified chronicity  Stiffness of left shoulder, not elsewhere classified  Other disturbances of skin sensation  Other lack of coordination    Problem  List Patient Active Problem List   Diagnosis Date Noted  . Palpitations 01/31/2017  . Cough 11/21/2016  . TIA (transient ischemic attack) 03/07/2016  . Essential hypertension 01/27/2012  . Migraine without aura 05/20/2008  . Allergic rhinitis 05/20/2008  . Mitral regurgitation 08/10/2007  . GERD 08/10/2007  . Diaphragmatic hernia 08/10/2007  . DIVERTICULOSIS, COLON 08/10/2007  . Osteoarthritis 08/10/2007  . COLONIC POLYPS, ADENOMATOUS, HX OF 08/10/2007    Carey Bullocks, OTR/L 08/13/2017, 2:14 PM  [facility] [address] [place], [place], [address] Phone: [phone]   Fax:  [phone]  Name: Anna Marquez MRN: [id number] Date of Birth: [birth year]

## 2017-08-14 ENCOUNTER — Ambulatory Visit (INDEPENDENT_AMBULATORY_CARE_PROVIDER_SITE_OTHER): Payer: PPO | Admitting: Diagnostic Neuroimaging

## 2017-08-14 ENCOUNTER — Encounter: Payer: Self-pay | Admitting: Diagnostic Neuroimaging

## 2017-08-14 DIAGNOSIS — M542 Cervicalgia: Secondary | ICD-10-CM

## 2017-08-14 DIAGNOSIS — G8929 Other chronic pain: Secondary | ICD-10-CM

## 2017-08-14 DIAGNOSIS — R200 Anesthesia of skin: Secondary | ICD-10-CM | POA: Diagnosis not present

## 2017-08-14 DIAGNOSIS — M545 Low back pain, unspecified: Secondary | ICD-10-CM

## 2017-08-14 DIAGNOSIS — Z0289 Encounter for other administrative examinations: Secondary | ICD-10-CM

## 2017-08-15 NOTE — Procedures (Signed)
[facility]  NCS (NERVE CONDUCTION STUDY) WITH EMG (ELECTROMYOGRAPHY) REPORT   STUDY DATE: 08/14/17 PATIENT NAME: Anna Marquez DOB: Jul 20, 1950 MRN: [id number]  ORDERING CLINICIAN: Andrey Spearman, MD   TECHNOLOGIST: Oneita Jolly ELECTROMYOGRAPHER: Earlean Polka. Nejla Reasor, MD  CLINICAL INFORMATION: 67 year old female with bilateral hand and lower extremity numbness and pain.  FINDINGS: NERVE CONDUCTION STUDY:  Right median motor response prolonged distal latency, normal amplitude, normal conduction velocity.  Left median, bilateral ulnar and right peroneal and right tibial motor responses are normal.  Right median sensory response cannot be obtained.  Left median, bilateral ulnar, right sural and right superficial peroneal sensory response are normal.  Bilateral lateral femoral cutaneous nerve sensory responses have prolonged peak latencies and decreased amplitude on the left side.   NEEDLE ELECTROMYOGRAPHY:  Needle examination of left upper extremity and left lower extremity is normal.    IMPRESSION:   Abnormal study demonstrating: - Right median neuropathy at the wrist consistent with right carpal tunnel syndrome. - Bilateral lateral femoral cutaneous neuropathies, worse on the left side (i.e. meralgia paresthetica).    INTERPRETING PHYSICIAN:  Penni Bombard, MD Certified in Neurology, Neurophysiology and Neuroimaging  [facility] [address] [place], [place] [address] [phone]   [facility]    Nerve / Sites Muscle Latency Ref. Amplitude Ref. Rel Amp Segments Distance Velocity Ref. Area    ms ms mV mV %  cm m/s m/s mVms  R Median - APB     Wrist APB 5.4 ?4.4 6.3 ?4.0 100 Wrist - APB 7   24.4     Upper arm APB 9.4  6.0  96 Upper arm - Wrist 22 54 ?49 24.7  L Median - APB     Wrist APB 3.8 ?4.4 7.3 ?4.0 100 Wrist - APB 7   25.3     Upper arm APB 7.5  7.2  99.4 Upper arm - Wrist 22 59 ?49 25.9  R Ulnar -  ADM     Wrist ADM 2.7 ?3.3 7.5 ?6.0 100 Wrist - ADM 7   30.5     B.Elbow ADM 5.7  7.7  102 B.Elbow - Wrist 20 66 ?49 30.5     A.Elbow ADM 7.2  7.9  103 A.Elbow - B.Elbow 10 66 ?49 32.7         A.Elbow - Wrist      L Ulnar - ADM     Wrist ADM 2.7 ?3.3 7.7 ?6.0 100 Wrist - ADM 7   27.8     B.Elbow ADM 6.0  7.0  91.2 B.Elbow - Wrist 20 60 ?49 26.9     A.Elbow ADM 7.8  6.9  99.4 A.Elbow - B.Elbow 10 56 ?49 27.6         A.Elbow - Wrist      R Peroneal - EDB     Ankle EDB 4.3 ?6.5 4.3 ?2.0 100 Ankle - EDB 9   16.0     Fib head EDB 10.3  3.3  75 Fib head - Ankle 31 52 ?44 12.5     Pop fossa EDB 12.4  3.3  102 Pop fossa - Fib head 10 47 ?44 12.4         Pop fossa - Ankle      R Tibial - AH     Ankle AH 4.0 ?5.8 8.9 ?4.0 100 Ankle - AH 9   22.7     Pop fossa AH 12.6  6.6  74.4 Pop  fossa - Ankle 36 42 ?41 16.9                      SNC    Nerve / Sites Rec. Site Peak Lat Ref.  Amp Ref. Segments Distance    ms ms V V  cm  R Sural - Ankle (Calf)     Calf Ankle 3.3 ?4.4 7 ?6 Calf - Ankle 14  R Superficial peroneal - Ankle     Lat leg Ankle 3.9 ?4.4 7 ?6 Lat leg - Ankle 14  R Median - Orthodromic (Dig II, Mid palm)     Dig II Wrist NR ?3.4 NR ?10 Dig II - Wrist 13  L Median - Orthodromic (Dig II, Mid palm)     Dig II Wrist 3.5 ?3.4 8 ?10 Dig II - Wrist 13  R Ulnar - Orthodromic, (Dig V, Mid palm)     Dig V Wrist 2.6 ?3.1 7 ?5 Dig V - Wrist 11  L Ulnar - Orthodromic, (Dig V, Mid palm)     Dig V Wrist 2.8 ?3.1 8 ?5 Dig V - Wrist 11  R Lateral femoral cutaneous - Thigh (Inguinal ligament)     A. Ing ligament Thigh 6.7  12  A. Ing ligament - Thigh 18  L Lateral femoral cutaneous - Thigh (Inguinal ligament)     A. Ing ligament Thigh 6.2  2  A. Ing ligament - Thigh                      F  Wave    Nerve F Lat Ref.   ms ms  R Tibial - AH 52.9 ?56.0  R Ulnar - ADM 27.2 ?32.0  L Ulnar - ADM 27.4 ?32.0           EMG full       EMG Summary Table    Spontaneous MUAP Recruitment  Muscle  IA Fib PSW Fasc Other Amp Dur. Poly Pattern  L. Deltoid Normal None None None _______ Normal Normal Normal Normal  L. Biceps brachii Normal None None None _______ Normal Normal Normal Normal  L. Triceps brachii Normal None None None _______ Normal Normal Normal Normal  L. Flexor carpi radialis Normal None None None _______ Normal Normal Normal Normal  L. First dorsal interosseous Normal None None None _______ Normal Normal Normal Normal  L. Vastus medialis Normal None None None _______ Normal Normal Normal Normal  L. Tibialis anterior Normal None None None _______ Normal Normal Normal Normal  L. Gastrocnemius (Medial head) Normal None None None _______ Normal Normal Normal Normal

## 2017-08-20 ENCOUNTER — Ambulatory Visit: Payer: PPO | Admitting: Physical Therapy

## 2017-08-20 ENCOUNTER — Encounter: Payer: PPO | Admitting: Occupational Therapy

## 2017-08-21 ENCOUNTER — Telehealth: Payer: Self-pay | Admitting: *Deleted

## 2017-08-22 NOTE — Telephone Encounter (Signed)
I discussed with pt during EMG. Bilateral carpal tunnel syndrome. May try wrist splints or consider hand surgery eval. -VRP

## 2017-08-22 NOTE — Telephone Encounter (Signed)
Replied to patient's my chart message with Dr Gladstone Lighter advice. Also advised if she hasn't heard back about scheduling PT/OT to call, provided Neuro rehab number in message.

## 2017-08-27 ENCOUNTER — Encounter: Payer: Self-pay | Admitting: Physical Therapy

## 2017-08-27 ENCOUNTER — Ambulatory Visit: Payer: PPO | Admitting: Occupational Therapy

## 2017-08-27 ENCOUNTER — Ambulatory Visit: Payer: PPO | Admitting: Physical Therapy

## 2017-08-27 DIAGNOSIS — R278 Other lack of coordination: Secondary | ICD-10-CM

## 2017-08-27 DIAGNOSIS — R208 Other disturbances of skin sensation: Secondary | ICD-10-CM

## 2017-08-27 DIAGNOSIS — M79605 Pain in left leg: Secondary | ICD-10-CM

## 2017-08-27 DIAGNOSIS — M25512 Pain in left shoulder: Secondary | ICD-10-CM

## 2017-08-27 DIAGNOSIS — M25612 Stiffness of left shoulder, not elsewhere classified: Secondary | ICD-10-CM

## 2017-08-27 DIAGNOSIS — M79604 Pain in right leg: Secondary | ICD-10-CM

## 2017-08-27 DIAGNOSIS — M6281 Muscle weakness (generalized): Secondary | ICD-10-CM

## 2017-08-27 NOTE — Therapy (Signed)
[facility] [address] [place], [place], [address] Phone: [phone]   Fax:  [phone]  Occupational Therapy Treatment  Patient Details  Name: Anna Marquez MRN: [id number] Date of Birth: [birth year] Referring Provider: Leta Baptist   Encounter Date: 08/27/2017  OT End of Session - 08/27/17 1418    Visit Number  3    Number of Visits  17    Date for OT Re-Evaluation  10/06/17    Authorization Type  [employer]    Authorization - Visit Number  3    Authorization - Number of Visits  10    OT Start Time  2119   pt arrived late   OT Stop Time  1315    OT Time Calculation (min)  33 min    Activity Tolerance  Patient tolerated treatment well    Behavior During Therapy  [facility] for tasks assessed/performed       Past Medical History:  Diagnosis Date  . Adenomatous colon polyp 2003  . Cholelithiasis   . Diverticulosis of colon   . Gastric polyps   . GERD (gastroesophageal reflux disease)   . Helicobacter pylori (H. pylori)   . Hiatal hernia   . Hypertension   . Mitral valve prolapse   . Nephrolithiasis   . Osteoarthritis   . Osteopenia 03/2017   T score -1.5 FRAX 8.9% / 0.9%.  Stable from prior DEXA 2016  . TGA (transient global amnesia)     Past Surgical History:  Procedure Laterality Date  . ANTERIOR AND POSTERIOR REPAIR N/A 06/14/2014   Procedure: ANTERIOR (CYSTOCELE) AND POSTERIOR REPAIR (RECTOCELE);  Surgeon: Anastasio Auerbach, MD;  Location: [facility];  Service: Gynecology;  Laterality: N/A;  . CHOLECYSTECTOMY    . kidney stone removal     x2  . VAGINAL HYSTERECTOMY N/A 06/14/2014   Procedure: HYSTERECTOMY VAGINAL;  Surgeon: Anastasio Auerbach, MD;  Location: [facility];  Service: Gynecology;  Laterality: N/A;    There were no vitals filed for this visit.  Subjective Assessment - 08/27/17 1243    Subjective   My shoulder rotation is better, and the gloves help my hands at night. But now the shooting  pain is in my whole Lt arm. The nerve conduction study did say I had CTS    Pertinent History  bone spurs cervical neck, OA bilateral hands    Limitations  none    Currently in Pain?  Yes    Pain Score  7     Pain Location  Arm    Pain Orientation  Left    Pain Descriptors / Indicators  Shooting    Pain Type  Chronic pain    Pain Onset  More than a month ago    Pain Frequency  Intermittent    Aggravating Factors   certain movements    Pain Relieving Factors  repositioning, gloves, splints         Treatment: Reviewed coordination and shoulder HEP - Pt return demo of each as indicated                    OT Short Term Goals - 08/13/17 1405      OT SHORT TERM GOAL #1   Title  Pt independent with HEP for LUE ROM - 09/05/17    Time  4    Period  Weeks    Status  On-going      OT SHORT TERM GOAL #2   Title  Pt to verbalize understanding with proper positioning and other pain management strategies for Lt shoulder    Time  4    Period  Weeks    Status  On-going      OT SHORT TERM GOAL #3   Title  Pt to be independent with coordination HEP bilateral hands    Time  4    Period  Weeks    Status  On-going      OT SHORT TERM GOAL #4   Title  Pt to verbalize understanding with safety considerations d/t lack of sensation bilateral hands    Time  4    Period  Weeks    Status  New        OT Long Term Goals - 08/05/17 1355      OT LONG TERM GOAL #1   Title  Independent with updated HEP Lt shoulder - due 10/06/17 (however, may extend date if placed on hold)     Time  8    Period  Weeks    Status  New      OT LONG TERM GOAL #2   Title  Pt to use LUE as assist for all BADLS (brushing/styling hair, assisting to hook bra, etc)    Time  8    Period  Weeks    Status  New      OT LONG TERM GOAL #3   Title  Pt to demo shoulder flexion to 130* w/ pain less than or equal to 5/10 for light overhead reaching    Baseline  115*    Time  8    Period  Weeks    Status   New      OT LONG TERM GOAL #4   Title  Pt to demo sufficient ER/IR to LUE to assist in washing hair and hooking bra    Time  8    Period  Weeks    Status  New            Plan - 08/27/17 1419    Clinical Impression Statement  Pt reports improvements in shoulder ROM. Pt reports CTS bilaterally however nerve conduction study results only show CTS Rt hand    Occupational Profile and client history currently impacting functional performance  bone spurs in neck, OA bilateral hands, parathesias in bilateral hands. Current shoulder pain and weakness affecting ability to perform ADLS    Occupational performance deficits (Please refer to evaluation for details):  ADL's;IADL's    Rehab Potential  Fair    Current Impairments/barriers affecting progress:  unknown etiology - no imaging done as of evaluation    OT Frequency  2x / week    OT Duration  8 weeks    OT Treatment/Interventions  Self-care/ADL training;Moist Heat;Fluidtherapy;DME and/or AE instruction;Therapeutic activities;Therapeutic exercise;Neuromuscular education;Passive range of motion;Patient/family education;Manual Therapy    Plan  issue CTS handout and median n. gliding ex's for RUE    Consulted and Agree with Plan of Care  Patient       Patient will benefit from skilled therapeutic intervention in order to improve the following deficits and impairments:  Decreased coordination, Decreased range of motion, Improper body mechanics, Impaired sensation, Impaired UE functional use, Pain, Decreased strength  Visit Diagnosis: Left shoulder pain, unspecified chronicity  Stiffness of left shoulder, not elsewhere classified  Other disturbances of skin sensation  Other lack of coordination  Muscle weakness (generalized)    Problem List Patient Active Problem List   Diagnosis Date Noted  .  Palpitations 01/31/2017  . Cough 11/21/2016  . TIA (transient ischemic attack) 03/07/2016  . Essential hypertension 01/27/2012  .  Migraine without aura 05/20/2008  . Allergic rhinitis 05/20/2008  . Mitral regurgitation 08/10/2007  . GERD 08/10/2007  . Diaphragmatic hernia 08/10/2007  . DIVERTICULOSIS, COLON 08/10/2007  . Osteoarthritis 08/10/2007  . COLONIC POLYPS, ADENOMATOUS, HX OF 08/10/2007    Carey Bullocks, OTR/L 08/27/2017, 2:21 PM  [facility] [address] [place], [place], [address] Phone: [phone]   Fax:  [phone]  Name: Anna Marquez MRN: [id number] Date of Birth: 10-09-1950

## 2017-08-27 NOTE — Therapy (Signed)
[facility] [address] [place], [place], [address] Phone: [phone]   Fax:  [phone]  Physical Therapy Treatment  Patient Details  Name: Anna Marquez MRN: [id number] Date of Birth: 02/15/1950 Referring Provider: Leta Baptist   Encounter Date: 08/27/2017  PT End of Session - 08/27/17 1713    Visit Number  3    Number of Visits  10    Date for PT Re-Evaluation  10/04/17    Authorization Type  [employer]-will need Progress note every 10th visit    PT Start Time  1315    PT Stop Time  1400    PT Time Calculation (min)  45 min       Past Medical History:  Diagnosis Date  . Adenomatous colon polyp 2003  . Cholelithiasis   . Diverticulosis of colon   . Gastric polyps   . GERD (gastroesophageal reflux disease)   . Helicobacter pylori (H. pylori)   . Hiatal hernia   . Hypertension   . Mitral valve prolapse   . Nephrolithiasis   . Osteoarthritis   . Osteopenia 03/2017   T score -1.5 FRAX 8.9% / 0.9%.  Stable from prior DEXA 2016  . TGA (transient global amnesia)     Past Surgical History:  Procedure Laterality Date  . ANTERIOR AND POSTERIOR REPAIR N/A 06/14/2014   Procedure: ANTERIOR (CYSTOCELE) AND POSTERIOR REPAIR (RECTOCELE);  Surgeon: Anastasio Auerbach, MD;  Location: [facility];  Service: Gynecology;  Laterality: N/A;  . CHOLECYSTECTOMY    . kidney stone removal     x2  . VAGINAL HYSTERECTOMY N/A 06/14/2014   Procedure: HYSTERECTOMY VAGINAL;  Surgeon: Anastasio Auerbach, MD;  Location: [facility];  Service: Gynecology;  Laterality: N/A;    There were no vitals filed for this visit.  Subjective Assessment - 08/27/17 1701    Subjective  Pt had nerve study; had postive findings for right carpal tunnel; also had postive findings on bilateral femoral cutaneous; she has symptoms with prolonged standing; it will resolve with sitting     Pertinent History  x-ray of cervical spine; scheduled on 08/14/17 for EMG/nerve  testing;  MD told her that insurace requried 30 day of rehab before MRI image could be done    Currently in Pain?  Yes    Pain Score  7     Pain Location  Arm    Pain Orientation  Left    Pain Descriptors / Indicators  Shooting         Therapeutic ex's Access Code: [email or web address]  URL: [email or web address]  Date: 08/27/2017  Prepared by: Rosaura Carpenter   Exercises  Corner Pec Major Stretch - 10 reps - 3 sets - 1x daily - 7x weekly  Supine Bilateral Shoulder Protraction - 10 reps - 3 sets - 1x daily - 7x weekly    All cervical ex's to be done  In supine not sit or stand  Hold each 5 seconds  Standing Isometric Cervical Rotation - 5 reps - 3 sets - 1x daily - 7x weekly  Seated Isometric Cervical Extension - 5 reps - 3 sets - 1x daily - 7x weekly  Seated Isometric Cervical Sidebending - 5 reps - 3 sets - 1x daily - 7x weekly  Seated Isometric Cervical Flexion - 5 reps - 3 sets - 1x daily - 7x weekly  Wall Angels - 10 reps - 3 sets - 1x daily - 7x weekly  Supine Shoulder External Rotation in Abduction - 10  reps - 3 sets - 1x daily - 7x weekly    Standing hip flexor stretch 3 x 30 seconds bilateral                           PT Long Term Goals - 08/07/17 1022      PT LONG TERM GOAL #1   Title  Pt will be independent with HEP for improved pain and improved functional mobility tolerance.  TARGET 09/05/17    Time  5    Period  Weeks    Status  New    Target Date  09/05/17      PT LONG TERM GOAL #2   Title  Pt will verbalize understanding of proper posture/body mechanics with lifting, ADLs, for decreased pain with ADLs and functional mobility.     Time  5    Period  Weeks    Status  New    Target Date  09/05/17      PT LONG TERM GOAL #3   Title  Pt will improve 5x sit<>stand to less than or equal to 12 seconds for improved transfer efficiency and safety.    Time  5    Period  Weeks    Status  New    Target Date  09/05/17      PT LONG TERM  GOAL #4   Title  Pt will improve Oswestry Questionnaire to less than or equal to 10% for improved pain free standing, walking, sleeping activities.    Time  5    Period  Weeks    Status  New    Target Date  09/05/17      PT LONG TERM GOAL #5   Title  6MWT to be assessed, with goal to be written as appropriate.    Time  5    Period  Weeks    Status  New    Target Date  09/05/17            Plan - 08/27/17 1713    Clinical Impression Statement  Able to produce numnbess/tingling in arms with supine posterior anterior glide of C5; grade 1-2;  concern of the limit in the cervial range and recommending she use a home traction unit and c-spine isometrics for stabilization; emphasizing no mvt that cause peripherialization of her synptoms ; to help with the neck and shoulder pain ; gave her some ex's for scapula stability and AROM of shoulder in pain free ranges only;  using the runners stretch to give a gentle soft tissue stretch along the femoral cutaneous which seems to be the cause of her anterior thigh symptoms and is consistent with nerve conduction test results     Rehab Potential  Good    Clinical Impairments Affecting Rehab Potential  C-spine compression at C5; limited accessory movt;     PT Treatment/Interventions  ADLs/Self Care Home Management;Electrical Stimulation;Moist Heat;Traction;Ultrasound;Therapeutic exercise;Therapeutic activities;Gait training;Patient/family education;Manual techniques    PT Next Visit Plan  follow up with the shoulder and neck exs and education for centralization of symtoms with cervical ROM       Patient will benefit from skilled therapeutic intervention in order to improve the following deficits and impairments:  Decreased activity tolerance, Decreased strength, Difficulty walking, Pain, Decreased range of motion  Visit Diagnosis: Left shoulder pain, unspecified chronicity  Stiffness of left shoulder, not elsewhere classified  Muscle weakness  (generalized)  Pain in right leg  Pain in left leg  Problem List Patient Active Problem List   Diagnosis Date Noted  . Palpitations 01/31/2017  . Cough 11/21/2016  . TIA (transient ischemic attack) 03/07/2016  . Essential hypertension 01/27/2012  . Migraine without aura 05/20/2008  . Allergic rhinitis 05/20/2008  . Mitral regurgitation 08/10/2007  . GERD 08/10/2007  . Diaphragmatic hernia 08/10/2007  . DIVERTICULOSIS, COLON 08/10/2007  . Osteoarthritis 08/10/2007  . COLONIC POLYPS, ADENOMATOUS, HX OF 08/10/2007    Rosaura Carpenter D PT DPT 08/27/2017, 5:20 PM  [facility] [address] [place], [place], [address] Phone: [phone]   Fax:  [phone]  Name: Anna Marquez MRN: [id number] Date of Birth: 1950-06-08

## 2017-08-28 ENCOUNTER — Encounter: Payer: Self-pay | Admitting: Physical Therapy

## 2017-08-28 ENCOUNTER — Ambulatory Visit: Payer: PPO | Admitting: Physical Therapy

## 2017-08-28 ENCOUNTER — Ambulatory Visit: Payer: PPO | Admitting: Occupational Therapy

## 2017-08-28 DIAGNOSIS — M79605 Pain in left leg: Secondary | ICD-10-CM

## 2017-08-28 DIAGNOSIS — M25612 Stiffness of left shoulder, not elsewhere classified: Secondary | ICD-10-CM

## 2017-08-28 DIAGNOSIS — M79604 Pain in right leg: Secondary | ICD-10-CM

## 2017-08-28 DIAGNOSIS — R293 Abnormal posture: Secondary | ICD-10-CM

## 2017-08-28 DIAGNOSIS — M25512 Pain in left shoulder: Secondary | ICD-10-CM

## 2017-08-28 DIAGNOSIS — M6281 Muscle weakness (generalized): Secondary | ICD-10-CM

## 2017-08-28 DIAGNOSIS — R208 Other disturbances of skin sensation: Secondary | ICD-10-CM

## 2017-08-28 NOTE — Therapy (Signed)
[facility] [address] [place], [place], [address] Phone: [phone]   Fax:  [phone]  Occupational Therapy Treatment  Patient Details  Name: Anna Marquez MRN: [id number] Date of Birth: [birth year] Referring Provider: Leta Baptist   Encounter Date: 08/28/2017  OT End of Session - 08/28/17 1145    Visit Number  4    Number of Visits  17    Date for OT Re-Evaluation  10/06/17    Authorization Type  [employer]    Authorization - Visit Number  4    Authorization - Number of Visits  10    OT Start Time  0845    OT Stop Time  0930    OT Time Calculation (min)  45 min    Activity Tolerance  Patient tolerated treatment well    Behavior During Therapy  [facility] for tasks assessed/performed       Past Medical History:  Diagnosis Date  . Adenomatous colon polyp 2003  . Cholelithiasis   . Diverticulosis of colon   . Gastric polyps   . GERD (gastroesophageal reflux disease)   . Helicobacter pylori (H. pylori)   . Hiatal hernia   . Hypertension   . Mitral valve prolapse   . Nephrolithiasis   . Osteoarthritis   . Osteopenia 03/2017   T score -1.5 FRAX 8.9% / 0.9%.  Stable from prior DEXA 2016  . TGA (transient global amnesia)     Past Surgical History:  Procedure Laterality Date  . ANTERIOR AND POSTERIOR REPAIR N/A 06/14/2014   Procedure: ANTERIOR (CYSTOCELE) AND POSTERIOR REPAIR (RECTOCELE);  Surgeon: Anastasio Auerbach, MD;  Location: [facility];  Service: Gynecology;  Laterality: N/A;  . CHOLECYSTECTOMY    . kidney stone removal     x2  . VAGINAL HYSTERECTOMY N/A 06/14/2014   Procedure: HYSTERECTOMY VAGINAL;  Surgeon: Anastasio Auerbach, MD;  Location: [facility];  Service: Gynecology;  Laterality: N/A;    There were no vitals filed for this visit.  Subjective Assessment - 08/28/17 0849    Subjective   The taping helped, and the neck ex's Merry Proud gave me have helped    Pertinent History  bone spurs cervical neck,  OA bilateral hands    Limitations  none    Currently in Pain?  Yes    Pain Score  3     Pain Location  Arm    Pain Orientation  Left    Pain Descriptors / Indicators  Aching;Sore    Pain Type  Chronic pain    Pain Onset  More than a month ago    Pain Frequency  Intermittent    Aggravating Factors   certain movements    Pain Relieving Factors  repositioning, gloves, splints        TREATMENT:  Pt issued CTS conservative management handout, lower median n. Gliding ex's and tendon gliding ex's for Rt hand. Pt demo each 5-10 reps.  Reviewed theraband ex's (rows and sh. Extension) and noted sh. Hiking - pt required v.c's and visual feedback to control LT shoulder. Pt demo each. Pt instructed to d/c horizontal abd. theraband ex for now secondary to anticipating sh compensations.  Noted pt activates upper traps w/ shoulder movement and pt encouraged to pay close attention to this and try to avoid. Pt issued ex in supine for scapula downward depression and pt demo x 10 reps.  OT Education - 08/28/17 1144    Education Details  CTS conservative managment, tendon gliding ex's and lower median n. gliding ex's for Rt hand    Person(s) Educated  Patient    Methods  Explanation;Demonstration;Handout    Comprehension  Verbalized understanding;Returned demonstration       OT Short Term Goals - 08/28/17 1145      OT SHORT TERM GOAL #1   Title  Pt independent with HEP for LUE ROM - 09/05/17    Time  4    Period  Weeks    Status  Achieved      OT SHORT TERM GOAL #2   Title  Pt to verbalize understanding with proper positioning and other pain management strategies for Lt shoulder    Time  4    Period  Weeks    Status  On-going      OT SHORT TERM GOAL #3   Title  Pt to be independent with coordination HEP bilateral hands    Time  4    Period  Weeks    Status  Achieved      OT SHORT TERM GOAL #4   Title  Pt to verbalize understanding with safety  considerations d/t lack of sensation bilateral hands    Time  4    Period  Weeks    Status  New        OT Long Term Goals - 08/05/17 1355      OT LONG TERM GOAL #1   Title  Independent with updated HEP Lt shoulder - due 10/06/17 (however, may extend date if placed on hold)     Time  8    Period  Weeks    Status  New      OT LONG TERM GOAL #2   Title  Pt to use LUE as assist for all BADLS (brushing/styling hair, assisting to hook bra, etc)    Time  8    Period  Weeks    Status  New      OT LONG TERM GOAL #3   Title  Pt to demo shoulder flexion to 130* w/ pain less than or equal to 5/10 for light overhead reaching    Baseline  115*    Time  8    Period  Weeks    Status  New      OT LONG TERM GOAL #4   Title  Pt to demo sufficient ER/IR to LUE to assist in washing hair and hooking bra    Time  8    Period  Weeks    Status  New            Plan - 08/28/17 1146    Clinical Impression Statement  Pt progressing with LUE shoulder movement and overall decr. pain.     Occupational Profile and client history currently impacting functional performance  bone spurs in neck, OA bilateral hands, parathesias in bilateral hands. Current shoulder pain and weakness affecting ability to perform ADLS    Occupational performance deficits (Please refer to evaluation for details):  ADL's;IADL's    Rehab Potential  Fair    Current Impairments/barriers affecting progress:  unknown etiology - no imaging done as of evaluation    OT Frequency  2x / week    OT Duration  8 weeks    OT Treatment/Interventions  Self-care/ADL training;Moist Heat;Fluidtherapy;DME and/or AE instruction;Therapeutic activities;Therapeutic exercise;Neuromuscular education;Passive range of motion;Patient/family education;Manual Therapy    Plan  taping Lt  shoulder to activate scapula retraction/downward depression, ER and to relax upper traps and middle deltoid, address remaining STG's    Consulted and Agree with Plan of  Care  Patient       Patient will benefit from skilled therapeutic intervention in order to improve the following deficits and impairments:  Decreased coordination, Decreased range of motion, Improper body mechanics, Impaired sensation, Impaired UE functional use, Pain, Decreased strength  Visit Diagnosis: Left shoulder pain, unspecified chronicity  Stiffness of left shoulder, not elsewhere classified  Other disturbances of skin sensation    Problem List Patient Active Problem List   Diagnosis Date Noted  . Palpitations 01/31/2017  . Cough 11/21/2016  . TIA (transient ischemic attack) 03/07/2016  . Essential hypertension 01/27/2012  . Migraine without aura 05/20/2008  . Allergic rhinitis 05/20/2008  . Mitral regurgitation 08/10/2007  . GERD 08/10/2007  . Diaphragmatic hernia 08/10/2007  . DIVERTICULOSIS, COLON 08/10/2007  . Osteoarthritis 08/10/2007  . COLONIC POLYPS, ADENOMATOUS, HX OF 08/10/2007    Carey Bullocks, OTR/L 08/28/2017, 11:48 AM  [facility] [address] [place], [place], [address] Phone: [phone]   Fax:  [phone]  Name: Anna Marquez MRN: [id number] Date of Birth: 1950/10/04

## 2017-08-28 NOTE — Patient Instructions (Signed)
Access Code: [email or web address]  URL: [email or web address]  Date: 08/28/2017  Prepared by: Willow Ora   Exercises  Corner Pec Major Stretch - 10 reps - 3 sets - 1x daily - 7x weekly  Supine Bilateral Shoulder Protraction - 10 reps - 3 sets - 1x daily - 7x weekly  Standing Isometric Cervical Rotation - 10 reps - 3 sets - 1x daily - 7x weekly  Seated Isometric Cervical Extension - 10 reps - 3 sets - 1x daily - 7x weekly  Seated Isometric Cervical Sidebending - 10 reps - 3 sets - 1x daily - 7x weekly  Seated Isometric Cervical Flexion - 10 reps - 3 sets - 1x daily - 7x weekly  Wall Angels - 10 reps - 3 sets - 1x daily - 7x weekly  Supine Shoulder External Rotation in Abduction - 10 reps - 3 sets - 1x daily - 7x weekly  Supine Posterior Pelvic Tilt - 10 reps - 1 sets - 5 hold - 1x daily - 5x weekly  Supine Bridge - 10 reps - 1 sets - 1x daily - 5x weekly  Lower Trunk Rotations - 3 reps - 1 sets - 30 hold - 1x daily - 5x weekly

## 2017-08-29 NOTE — Therapy (Signed)
[facility] [address] [place], [place], [address] Phone: [phone]   Fax:  [phone]  Physical Therapy Treatment  Patient Details  Name: Anna Marquez MRN: [id number] Date of Birth: 12/29/1950 Referring Provider: Leta Baptist   Encounter Date: 08/28/2017  PT End of Session - 08/28/17 0932    Visit Number  4    Number of Visits  10    Date for PT Re-Evaluation  10/04/17    Authorization Type  [employer]-will need Progress note every 10th visit    PT Start Time  0932    PT Stop Time  1015    PT Time Calculation (min)  43 min    Equipment Utilized During Treatment  Gait belt    Activity Tolerance  Patient tolerated treatment well;No increased pain    Behavior During Therapy  WFL for tasks assessed/performed       Past Medical History:  Diagnosis Date  . Adenomatous colon polyp 2003  . Cholelithiasis   . Diverticulosis of colon   . Gastric polyps   . GERD (gastroesophageal reflux disease)   . Helicobacter pylori (H. pylori)   . Hiatal hernia   . Hypertension   . Mitral valve prolapse   . Nephrolithiasis   . Osteoarthritis   . Osteopenia 03/2017   T score -1.5 FRAX 8.9% / 0.9%.  Stable from prior DEXA 2016  . TGA (transient global amnesia)     Past Surgical History:  Procedure Laterality Date  . ANTERIOR AND POSTERIOR REPAIR N/A 06/14/2014   Procedure: ANTERIOR (CYSTOCELE) AND POSTERIOR REPAIR (RECTOCELE);  Surgeon: Anastasio Auerbach, MD;  Location: [facility];  Service: Gynecology;  Laterality: N/A;  . CHOLECYSTECTOMY    . kidney stone removal     x2  . VAGINAL HYSTERECTOMY N/A 06/14/2014   Procedure: HYSTERECTOMY VAGINAL;  Surgeon: Anastasio Auerbach, MD;  Location: [facility];  Service: Gynecology;  Laterality: N/A;    There were no vitals filed for this visit.  Subjective Assessment - 08/28/17 0930    Subjective  No new complaints. No changes since last visit.     Pertinent History  x-ray of cervical  spine; scheduled on 08/14/17 for EMG/nerve testing;  MD told her that [employer] requried 30 day of rehab before MRI image could be done    Patient Stated Goals  Pt's goal for therapy is to be able to use arm more and just figure out what is going on to be able to sleep, move better.    Currently in Pain?  Yes    Pain Score  3     Pain Location  Arm    Pain Orientation  Left    Pain Descriptors / Indicators  Aching;Sore    Pain Type  Chronic pain    Pain Onset  More than a month ago    Pain Frequency  Intermittent    Aggravating Factors   certain movements    Pain Relieving Factors  repositioning, gloves, spints    Multiple Pain Sites  Yes    Pain Score  0    Pain Location  Leg         [facility] PT Assessment - 08/28/17 0935      6 Minute Walk- Baseline   6 Minute Walk- Baseline  yes    BP (mmHg)  115/72    HR (bpm)  61    02 Sat (%RA)  95 %    Modified Borg Scale for Dyspnea  0- Nothing at all    Perceived Rate of Exertion (Borg)  6-      6 Minute walk- Post Test   6 Minute Walk Post Test  yes    BP (mmHg)  127/81    HR (bpm)  61    02 Sat (%RA)  98 %    Modified Borg Scale for Dyspnea  0- Nothing at all    Perceived Rate of Exertion (Borg)  6-      6 minute walk test results    Aerobic Endurance Distance Walked  1261    Endurance additional comments  no AD, no rest breaks. no increase in pain, just some 'fatigue in back'           [facility] Adult PT Treatment/Exercise - 08/28/17 0950      Lumbar Exercises: Stretches   Double Knee to Chest Stretch  2 reps;30 seconds;Limitations    Lower Trunk Rotation  2 reps;30 seconds;Limitations      Lumbar Exercises: Supine   Pelvic Tilt  10 reps;5 seconds;Limitations    Pelvic Tilt Limitations  cues for abdominal bracing    Bent Knee Raise  10 reps;Limitations    Bridge  Non-compliant;10 reps;Limitations    Bridge Limitations  with hands resting on abdomen so not to cause pain in UE's    Bridge with March  Non-compliant;10  reps;Limitations    Bridge with [employer] Limitations  resting down between each rep with hands still resting on her abdomen    Other Supine Lumbar Exercises  table top hold for 15 sec's x 5 reps, cues for abdominal bracing.         Access Code: [email or web address]  URL: [email or web address]  Date: 08/28/2017  Prepared by: Willow Ora   Exercises  Corner Pec Major Stretch - 10 reps - 3 sets - 1x daily - 7x weekly  Supine Bilateral Shoulder Protraction - 10 reps - 3 sets - 1x daily - 7x weekly  Standing Isometric Cervical Rotation - 10 reps - 3 sets - 1x daily - 7x weekly  Seated Isometric Cervical Extension - 10 reps - 3 sets - 1x daily - 7x weekly  Seated Isometric Cervical Sidebending - 10 reps - 3 sets - 1x daily - 7x weekly  Seated Isometric Cervical Flexion - 10 reps - 3 sets - 1x daily - 7x weekly  Wall Angels - 10 reps - 3 sets - 1x daily - 7x weekly  Supine Shoulder External Rotation in Abduction - 10 reps - 3 sets - 1x daily - 7x weekly   Added the following to HEP today with cues on correct ex form/technique Supine Posterior Pelvic Tilt - 10 reps - 1 sets - 5 hold - 1x daily - 5x weekly  Supine Bridge - 10 reps - 1 sets - 1x daily - 5x weekly  Lower Trunk Rotations - 3 reps - 1 sets - 30 hold - 1x daily - 5x weekly        PT Long Term Goals - 08/29/17 0950      PT LONG TERM GOAL #1   Title  Pt will be independent with HEP for improved pain and improved functional mobility tolerance.  TARGET 09/05/17    Time  5    Period  Weeks    Status  On-going      PT LONG TERM GOAL #2   Title  Pt will verbalize understanding of proper posture/body mechanics with lifting, ADLs, for decreased pain with ADLs and  functional mobility.     Time  5    Period  Weeks    Status  On-going      PT LONG TERM GOAL #3   Title  Pt will improve 5x sit<>stand to less than or equal to 12 seconds for improved transfer efficiency and safety.    Time  5    Period  Weeks    Status   On-going      PT LONG TERM GOAL #4   Title  Pt will improve Oswestry Questionnaire to less than or equal to 10% for improved pain free standing, walking, sleeping activities.    Time  5    Period  Weeks    Status  On-going      PT LONG TERM GOAL #5   Title  6MWT to be assessed, with goal to be written as appropriate.    Baseline  08/28/17: 1261 feet with no AD, no rest breaks. Lower that pts age related norm distance of 1765 feet. Primary PT to set goal as indicated.     Status  Achieved            Plan - 08/28/17 0934    Clinical Impression Statement  Today's skilled session established pt's 6 minute walk test baseline with primary PT to set goal as indicated. Remainder of session focused on core stability and LE stretching with additions to HEP given (refer to [place] section of note). No increase in pain was reported. Pt is progressing toward goals and should benefit from continued PT to progress toward unmet goals.     Rehab Potential  Good    Clinical Impairments Affecting Rehab Potential  C-spine compression at C5; limited accessory movt;     PT Frequency  2x / week    PT Treatment/Interventions  ADLs/Self Care Home Management;Electrical Stimulation;Moist Heat;Traction;Ultrasound;Therapeutic exercise;Therapeutic activities;Gait training;Patient/family education;Manual techniques    PT Next Visit Plan  Pt's goals due next week with plan of care ending 2/50/03, ? recert due to only being seen for 4 visits to date due to scheduling issues. Primary PT to set 6 minute walk test goals as indicated/warrented.        Patient will benefit from skilled therapeutic intervention in order to improve the following deficits and impairments:  Decreased activity tolerance, Decreased strength, Difficulty walking, Pain, Decreased range of motion  Visit Diagnosis: Muscle weakness (generalized)  Pain in right leg  Pain in left leg  Abnormal posture     Problem List Patient Active  Problem List   Diagnosis Date Noted  . Palpitations 01/31/2017  . Cough 11/21/2016  . TIA (transient ischemic attack) 03/07/2016  . Essential hypertension 01/27/2012  . Migraine without aura 05/20/2008  . Allergic rhinitis 05/20/2008  . Mitral regurgitation 08/10/2007  . GERD 08/10/2007  . Diaphragmatic hernia 08/10/2007  . DIVERTICULOSIS, COLON 08/10/2007  . Osteoarthritis 08/10/2007  . COLONIC POLYPS, ADENOMATOUS, HX OF 08/10/2007    Willow Ora, PTA, [facility] [address], [place], [place] [address] [phone] 08/29/17, 10:01 AM   Name: Anna Marquez MRN: [id number] Date of Birth: [birth year]

## 2017-09-02 ENCOUNTER — Ambulatory Visit: Payer: PPO | Admitting: Occupational Therapy

## 2017-09-02 ENCOUNTER — Ambulatory Visit: Payer: PPO | Admitting: Physical Therapy

## 2017-09-04 ENCOUNTER — Ambulatory Visit: Payer: PPO | Admitting: Physical Therapy

## 2017-09-04 ENCOUNTER — Encounter: Payer: Self-pay | Admitting: Physical Therapy

## 2017-09-04 ENCOUNTER — Ambulatory Visit: Payer: PPO | Admitting: Occupational Therapy

## 2017-09-04 DIAGNOSIS — R293 Abnormal posture: Secondary | ICD-10-CM

## 2017-09-04 DIAGNOSIS — M25612 Stiffness of left shoulder, not elsewhere classified: Secondary | ICD-10-CM

## 2017-09-04 DIAGNOSIS — M6281 Muscle weakness (generalized): Secondary | ICD-10-CM

## 2017-09-04 DIAGNOSIS — M79604 Pain in right leg: Secondary | ICD-10-CM

## 2017-09-04 DIAGNOSIS — R208 Other disturbances of skin sensation: Secondary | ICD-10-CM

## 2017-09-04 DIAGNOSIS — M25512 Pain in left shoulder: Secondary | ICD-10-CM | POA: Diagnosis not present

## 2017-09-04 DIAGNOSIS — M79605 Pain in left leg: Secondary | ICD-10-CM

## 2017-09-04 NOTE — Therapy (Signed)
[facility] [address] [place], [place], [address] Phone: [phone]   Fax:  [phone]  Occupational Therapy Treatment  Patient Details  Name: Anna Marquez MRN: [id number] Date of Birth: [birth year] Referring Provider: Leta Baptist   Encounter Date: 09/04/2017  OT End of Session - 09/04/17 1642    Visit Number  5    Number of Visits  17    Date for OT Re-Evaluation  10/06/17    Authorization Type  [employer]    Authorization - Visit Number  5    Authorization - Number of Visits  10    OT Start Time  4492    OT Stop Time  1530    OT Time Calculation (min)  45 min    Activity Tolerance  Patient tolerated treatment well    Behavior During Therapy  [facility] for tasks assessed/performed       Past Medical History:  Diagnosis Date  . Adenomatous colon polyp 2003  . Cholelithiasis   . Diverticulosis of colon   . Gastric polyps   . GERD (gastroesophageal reflux disease)   . Helicobacter pylori (H. pylori)   . Hiatal hernia   . Hypertension   . Mitral valve prolapse   . Nephrolithiasis   . Osteoarthritis   . Osteopenia 03/2017   T score -1.5 FRAX 8.9% / 0.9%.  Stable from prior DEXA 2016  . TGA (transient global amnesia)     Past Surgical History:  Procedure Laterality Date  . ANTERIOR AND POSTERIOR REPAIR N/A 06/14/2014   Procedure: ANTERIOR (CYSTOCELE) AND POSTERIOR REPAIR (RECTOCELE);  Surgeon: Anastasio Auerbach, MD;  Location: [facility];  Service: Gynecology;  Laterality: N/A;  . CHOLECYSTECTOMY    . kidney stone removal     x2  . VAGINAL HYSTERECTOMY N/A 06/14/2014   Procedure: HYSTERECTOMY VAGINAL;  Surgeon: Anastasio Auerbach, MD;  Location: [facility];  Service: Gynecology;  Laterality: N/A;    There were no vitals filed for this visit.  Subjective Assessment - 09/04/17 1632    Pertinent History  bone spurs cervical neck, OA bilateral hands    Limitations  none    Currently in Pain?  Yes    Pain  Score  4     Pain Location  Arm    Pain Orientation  Left    Pain Descriptors / Indicators  Aching;Sore    Pain Type  Chronic pain    Pain Onset  More than a month ago    Pain Frequency  Intermittent    Aggravating Factors   Certain movements    Pain Relieving Factors  repositioning                   OT Treatments/Exercises (OP) - 09/04/17 0001      ADLs   ADL Comments  Reviewed goals and progress to date. Discussed proper positioning of Lt shoulder for reaching activities, positions to avoid, and proper sleeping position. Also discussed safety considerations w/ lack of sensation in hands including testing temperature of water w/ uninvolved body part, using vision to compensate, and A/E for cutting/chopping      Splinting   Splinting  Pt brought in night time splints for CTS - Pt reports they help at night, but cannot use during the day to support wrist d/t nature of these splints. Pt issued D-ring splint for Rt hand to wear during the day during aggravating activities/lifting. If it helps w/ Rt hand, may issue for  Lt hand as well. Reviewed wear and care including proper removal and replacement of metal stay      Manual Therapy   Manual therapy comments  Kinesiotaping Lt shoulder to relax/inhibit middle deltoid, relax/inhibit upper traps; and to activate sh ER and scapula retraction and downward depression. Pt instructed in wearing time and allowing 1 day w/o taping before re-applying for skin integrity. If pt feels this helps her shoulder, will videotape application next time for daughter to be able to carryover taping at home.                OT Short Term Goals - 09/04/17 1643      OT SHORT TERM GOAL #1   Title  Pt independent with HEP for LUE ROM - 09/05/17    Time  4    Period  Weeks    Status  Achieved      OT SHORT TERM GOAL #2   Title  Pt to verbalize understanding with proper positioning and other pain management strategies for Lt shoulder    Time  4     Period  Weeks    Status  Achieved      OT SHORT TERM GOAL #3   Title  Pt to be independent with coordination HEP bilateral hands    Time  4    Period  Weeks    Status  Achieved      OT SHORT TERM GOAL #4   Title  Pt to verbalize understanding with safety considerations d/t lack of sensation bilateral hands    Time  4    Period  Weeks    Status  Achieved        OT Long Term Goals - 08/05/17 1355      OT LONG TERM GOAL #1   Title  Independent with updated HEP Lt shoulder - due 10/06/17 (however, may extend date if placed on hold)     Time  8    Period  Weeks    Status  New      OT LONG TERM GOAL #2   Title  Pt to use LUE as assist for all BADLS (brushing/styling hair, assisting to hook bra, etc)    Time  8    Period  Weeks    Status  New      OT LONG TERM GOAL #3   Title  Pt to demo shoulder flexion to 130* w/ pain less than or equal to 5/10 for light overhead reaching    Baseline  115*    Time  8    Period  Weeks    Status  New      OT LONG TERM GOAL #4   Title  Pt to demo sufficient ER/IR to LUE to assist in washing hair and hooking bra    Time  8    Period  Weeks    Status  New            Plan - 09/04/17 1643    Clinical Impression Statement  Pt has met all STG's. Pt with improvement in Lt shoulder ROM and pain with proper positioning    Occupational Profile and client history currently impacting functional performance  bone spurs in neck, OA bilateral hands, parathesias in bilateral hands. Current shoulder pain and weakness affecting ability to perform ADLS    Occupational performance deficits (Please refer to evaluation for details):  ADL's;IADL's    Rehab Potential  Fair    Current Impairments/barriers  affecting progress:  unknown etiology - no imaging done as of evaluation    OT Frequency  2x / week    OT Duration  8 weeks    OT Treatment/Interventions  Self-care/ADL training;Moist Heat;Fluidtherapy;DME and/or AE instruction;Therapeutic  activities;Therapeutic exercise;Neuromuscular education;Passive range of motion;Patient/family education;Manual Therapy    Plan  assess kinesiotape, videotape application for carryover at home (so pt's daughter can apply)    Consulted and Agree with Plan of Care  Patient       Patient will benefit from skilled therapeutic intervention in order to improve the following deficits and impairments:  Decreased coordination, Decreased range of motion, Improper body mechanics, Impaired sensation, Impaired UE functional use, Pain, Decreased strength  Visit Diagnosis: Left shoulder pain, unspecified chronicity  Stiffness of left shoulder, not elsewhere classified  Other disturbances of skin sensation  Muscle weakness (generalized)    Problem List Patient Active Problem List   Diagnosis Date Noted  . Palpitations 01/31/2017  . Cough 11/21/2016  . TIA (transient ischemic attack) 03/07/2016  . Essential hypertension 01/27/2012  . Migraine without aura 05/20/2008  . Allergic rhinitis 05/20/2008  . Mitral regurgitation 08/10/2007  . GERD 08/10/2007  . Diaphragmatic hernia 08/10/2007  . DIVERTICULOSIS, COLON 08/10/2007  . Osteoarthritis 08/10/2007  . COLONIC POLYPS, ADENOMATOUS, HX OF 08/10/2007    Carey Bullocks, OTR/L 09/04/2017, 4:45 PM  [facility] [address] [place], [place], [address] Phone: [phone]   Fax:  [phone]  Name: Anna Marquez MRN: [id number] Date of Birth: [birth year]

## 2017-09-06 NOTE — Therapy (Signed)
[facility] [address] [place], [place], [address] Phone: [phone]   Fax:  [phone]  Physical Therapy Treatment  Patient Details  Name: Anna Marquez MRN: [id number] Date of Birth: March 30, 1950 Referring Provider: Leta Baptist   Encounter Date: 09/04/2017    09/04/17 1544  PT Visits / Re-Eval  Visit Number 5  Number of Visits 10  Date for PT Re-Evaluation 10/04/17  Authorization  Authorization Type [employer]-will need Progress note every 10th visit  PT Time Calculation  PT Start Time 1538  PT Stop Time 1620  PT Time Calculation (min) 42 min  PT - End of Session  Equipment Utilized During Treatment Gait belt  Activity Tolerance Patient tolerated treatment well;No increased pain  Behavior During Therapy WFL for tasks assessed/performed     Past Medical History:  Diagnosis Date  . Adenomatous colon polyp 2003  . Cholelithiasis   . Diverticulosis of colon   . Gastric polyps   . GERD (gastroesophageal reflux disease)   . Helicobacter pylori (H. pylori)   . Hiatal hernia   . Hypertension   . Mitral valve prolapse   . Nephrolithiasis   . Osteoarthritis   . Osteopenia 03/2017   T score -1.5 FRAX 8.9% / 0.9%.  Stable from prior DEXA 2016  . TGA (transient global amnesia)     Past Surgical History:  Procedure Laterality Date  . ANTERIOR AND POSTERIOR REPAIR N/A 06/14/2014   Procedure: ANTERIOR (CYSTOCELE) AND POSTERIOR REPAIR (RECTOCELE);  Surgeon: Anastasio Auerbach, MD;  Location: [facility];  Service: Gynecology;  Laterality: N/A;  . CHOLECYSTECTOMY    . kidney stone removal     x2  . VAGINAL HYSTERECTOMY N/A 06/14/2014   Procedure: HYSTERECTOMY VAGINAL;  Surgeon: Anastasio Auerbach, MD;  Location: [facility];  Service: Gynecology;  Laterality: N/A;    There were no vitals filed for this visit.     09/04/17 1540  Symptoms/Limitations  Subjective Reports lower back fatigue with new back ex's. Has on 2  occasions had numbness/tingling down to knees that goes away when she does the stretches that Merry Proud showed her to do.   Pertinent History x-ray of cervical spine; scheduled on 08/14/17 for EMG/nerve testing;  MD told her that insur[employer] requried 30 day of rehab before MRI image could be done  Patient Stated Goals Pt's goal for therapy is to be able to use arm more and just figure out what is going on to be able to sleep, move better.  Pain Assessment  Currently in Pain? Yes  Pain Score 4  Pain Location Arm  Pain Orientation Left  Pain Descriptors / Indicators Aching;Sore  Pain Type Chronic pain  Pain Frequency Intermittent  Aggravating Factors  certain movements  Pain Relieving Factors repositioning, gloves, splints  2nd Pain Site  Pain Score 0  Pain Location Leg  Pain Orientation Right;Anterior  Pain Descriptors / Indicators Numbness  Pain Onset More than a month ago  Pain Frequency Intermittent  Aggravating Factors  certain activities  Pain Relieving Factors resting      09/04/17 1546  Lumbar Exercises: Stretches  Double Knee to Chest Stretch 2 reps;30 seconds;Limitations  Double Knee to Chest Stretch Limitations assist needed to hold position due to UE restrictions  Lower Trunk Rotation 2 reps;30 seconds;Limitations  Lower Trunk Rotation Limitations cues for form and hold times  Lumbar Exercises: Standing  Other Standing Lumbar Exercises on balance board both ways: rocking board with emphasis on tall posture with EO  progressing to [facility], then holding board steady for- EC no head movements, progressing to EC head movements left<>right, up<>down. cues on form and weight shifting to assist with balance recovery.   Lumbar Exercises: Seated  Other Seated Lumbar Exercises seated on large blue ball: bouncing 1 minute x 2 reps, pelvic rocking laterally, pelvic rocks ant/posterior, pelvic circles both ways, alternating long arc quads, alternating marching, and combo contralateral UE/LE raises.  min guard to min assist for balance on ball with cues for abdominal bracing and ex form/techniques.   Lumbar Exercises: Quadruped  [place]/Old Horse 5 reps;Limitations  Madcat/Old Horse Limitations with red ball support under abdomen       PT Long Term Goals - 08/29/17 0950      PT LONG TERM GOAL #1   Title  Pt will be independent with HEP for improved pain and improved functional mobility tolerance.  TARGET 09/05/17    Time  5    Period  Weeks    Status  On-going      PT LONG TERM GOAL #2   Title  Pt will verbalize understanding of proper posture/body mechanics with lifting, ADLs, for decreased pain with ADLs and functional mobility.     Time  5    Period  Weeks    Status  On-going      PT LONG TERM GOAL #3   Title  Pt will improve 5x sit<>stand to less than or equal to 12 seconds for improved transfer efficiency and safety.    Time  5    Period  Weeks    Status  On-going      PT LONG TERM GOAL #4   Title  Pt will improve Oswestry Questionnaire to less than or equal to 10% for improved pain free standing, walking, sleeping activities.    Time  5    Period  Weeks    Status  On-going      PT LONG TERM GOAL #5   Title  6MWT to be assessed, with goal to be written as appropriate.    Baseline  08/28/17: 1261 feet with no AD, no rest breaks. Lower that pts age related norm distance of 1765 feet. Primary PT to set goal as indicated.     Status  Achieved          09/04/17 1544  Plan  Clinical Impression Statement Today's skilled session focused on core strengthening and stabilization with no issues reported in session except for increased tightness in back with balance board. Ended with stretching on mat which relieved this. Pt is progressing toward goals and should benefit from continued PT to progress toward unmet goals.   Pt will benefit from skilled therapeutic intervention in order to improve on the following deficits Decreased activity tolerance;Decreased strength;Difficulty  walking;Pain;Decreased range of motion  Rehab Potential Good  Clinical Impairments Affecting Rehab Potential C-spine compression at C5; limited accessory movt;   PT Frequency 2x / week  PT Treatment/Interventions ADLs/Self Care Home Management;Electrical Stimulation;Moist Heat;Traction;Ultrasound;Therapeutic exercise;Therapeutic activities;Gait training;Patient/family education;Manual techniques  PT Next Visit Plan continue to work on core strengthening, balance. goals due next week per discussion with primary PT (will be 4th week of plan of care, not 09/05/17 date listed on goals)         Patient will benefit from skilled therapeutic intervention in order to improve the following deficits and impairments:  Decreased activity tolerance, Decreased strength, Difficulty walking, Pain, Decreased range of motion  Visit Diagnosis: Pain in right leg  Pain  in left leg  Abnormal posture  Muscle weakness (generalized)     Problem List Patient Active Problem List   Diagnosis Date Noted  . Palpitations 01/31/2017  . Cough 11/21/2016  . TIA (transient ischemic attack) 03/07/2016  . Essential hypertension 01/27/2012  . Migraine without aura 05/20/2008  . Allergic rhinitis 05/20/2008  . Mitral regurgitation 08/10/2007  . GERD 08/10/2007  . Diaphragmatic hernia 08/10/2007  . DIVERTICULOSIS, COLON 08/10/2007  . Osteoarthritis 08/10/2007  . COLONIC POLYPS, ADENOMATOUS, HX OF 08/10/2007    Willow Ora, PTA, [facility] [address], [place], [place] [address] [phone] 09/06/17, 4:18 PM   Name: MAUD RUBENDALL MRN: [id number] Date of Birth: 1950/12/14

## 2017-09-15 ENCOUNTER — Ambulatory Visit: Payer: PPO | Admitting: Occupational Therapy

## 2017-09-15 ENCOUNTER — Ambulatory Visit: Payer: PPO | Admitting: Physical Therapy

## 2017-09-18 ENCOUNTER — Encounter: Payer: PPO | Admitting: Occupational Therapy

## 2017-09-18 ENCOUNTER — Ambulatory Visit: Payer: PPO | Admitting: Physical Therapy

## 2017-10-01 ENCOUNTER — Other Ambulatory Visit: Payer: Self-pay | Admitting: Family Medicine

## 2017-10-20 ENCOUNTER — Ambulatory Visit (INDEPENDENT_AMBULATORY_CARE_PROVIDER_SITE_OTHER): Payer: PPO | Admitting: Internal Medicine

## 2017-10-20 ENCOUNTER — Other Ambulatory Visit (INDEPENDENT_AMBULATORY_CARE_PROVIDER_SITE_OTHER): Payer: PPO

## 2017-10-20 ENCOUNTER — Encounter: Payer: Self-pay | Admitting: Internal Medicine

## 2017-10-20 VITALS — BP 102/80 | HR 82 | Temp 98.0°F | Ht 64.0 in | Wt 167.0 lb

## 2017-10-20 DIAGNOSIS — R319 Hematuria, unspecified: Secondary | ICD-10-CM

## 2017-10-20 DIAGNOSIS — K529 Noninfective gastroenteritis and colitis, unspecified: Secondary | ICD-10-CM

## 2017-10-20 DIAGNOSIS — N3001 Acute cystitis with hematuria: Secondary | ICD-10-CM | POA: Diagnosis not present

## 2017-10-20 LAB — COMPREHENSIVE METABOLIC PANEL
ALBUMIN: 4.3 g/dL (ref 3.5–5.2)
ALT: 13 U/L (ref 0–35)
AST: 11 U/L (ref 0–37)
Alkaline Phosphatase: 50 U/L (ref 39–117)
BILIRUBIN TOTAL: 1.2 mg/dL (ref 0.2–1.2)
BUN: 16 mg/dL (ref 6–23)
CALCIUM: 9.7 mg/dL (ref 8.4–10.5)
CO2: 30 meq/L (ref 19–32)
Chloride: 101 mEq/L (ref 96–112)
Creatinine, Ser: 0.98 mg/dL (ref 0.40–1.20)
GFR: 59.86 mL/min — AB (ref >60.00)
Glucose, Bld: 94 mg/dL (ref 70–99)
Potassium: 3.6 mEq/L (ref 3.5–5.1)
Sodium: 138 mEq/L (ref 135–145)
Total Protein: 7.3 g/dL (ref 6.0–8.3)

## 2017-10-20 LAB — CBC
HCT: 36.2 % (ref 36.0–46.0)
HEMOGLOBIN: 12.5 g/dL (ref 12.0–15.0)
MCHC: 34.6 g/dL (ref 30.0–36.0)
MCV: 87.5 fl (ref 78.0–100.0)
PLATELETS: 198 10*3/uL (ref 150.0–400.0)
RBC: 4.13 Mil/uL (ref 3.87–5.11)
RDW: 13.7 % (ref 11.5–15.5)
WBC: 7.2 10*3/uL (ref 4.0–10.5)

## 2017-10-20 LAB — POCT URINALYSIS DIPSTICK
Bilirubin, UA: NEGATIVE
Glucose, UA: NEGATIVE
Ketones, UA: NEGATIVE
NITRITE UA: POSITIVE
PH UA: 5 (ref 5.0–8.0)
PROTEIN UA: POSITIVE — AB
Spec Grav, UA: >=1.03 — AB (ref 1.010–1.025)
UROBILINOGEN UA: 1 U/dL

## 2017-10-20 MED ORDER — CIPROFLOXACIN HCL 500 MG PO TABS: 500.0000 mg | ORAL_TABLET | Freq: Two times a day (BID) | ORAL | 0 refills | Status: AC

## 2017-10-20 NOTE — Patient Instructions (Signed)
We have sent in ciprofloxacin to take 1 pill twice a day for 5 days.

## 2017-10-20 NOTE — Progress Notes (Signed)
   Subjective:    Patient ID: Anna Marquez, female    DOB: 31-May-1950, 67 y.o.   MRN: [id number]  HPI The patient is a 67 YO female coming in for several concerns including diarrhea (had a GI bug last week, denies new foods or eating leftovers, she had about 1-2 days of diarrhea and vomiting, now stools back to normal and eating and drinking normally, denies nausea anymore, weight is down some and still some fatigued) as well as blood in urine (the day after diarrhea started she started seeing darker urine and thought she was dehydrated, so started drinking more water, then started seeing more pink and then blood in urine, she kept drinking more fluids), and then started getting UTI symptoms (burning in lower abdomen and pain with urination, having to urinate more often and with more urgency).   Review of Systems  Constitutional: Positive for activity change, appetite change, fatigue and unexpected weight change. Negative for chills and fever.  HENT: Negative.   Eyes: Negative.   Respiratory: Negative for cough, chest tightness and shortness of breath.   Cardiovascular: Negative for chest pain, palpitations and leg swelling.  Gastrointestinal: Positive for abdominal pain, diarrhea, nausea and vomiting. Negative for abdominal distention and constipation.  Genitourinary: Positive for dysuria, frequency, hematuria and urgency.  Musculoskeletal: Negative.   Skin: Negative.   Neurological: Negative.   Psychiatric/Behavioral: Negative.       Objective:   Physical Exam  Constitutional: She is oriented to person, place, and time. She appears well-developed and well-nourished.  Appears fatigued  HENT:  Head: Normocephalic and atraumatic.  Eyes: EOM are normal.  Neck: Normal range of motion.  Cardiovascular: Normal rate and regular rhythm.  Pulmonary/Chest: Effort normal and breath sounds normal. No respiratory distress. She has no wheezes. She has no rales.  Abdominal: Soft. Bowel sounds are  normal. She exhibits no distension. There is tenderness. There is no rebound.  Some suprapubic tenderness  Musculoskeletal: She exhibits no edema.  Neurological: She is alert and oriented to person, place, and time. Coordination normal.  Skin: Skin is warm and dry.   Vitals:   10/20/17 1422  BP: 102/80  Pulse: 82  Temp: 98 F (36.7 C)  TempSrc: Oral  SpO2: 97%  Weight: 167 lb (75.8 kg)  Height: 5\' 4"  (1.626 m)      Assessment & Plan:

## 2017-10-21 ENCOUNTER — Other Ambulatory Visit: Payer: Self-pay

## 2017-10-21 ENCOUNTER — Emergency Department (HOSPITAL_COMMUNITY): Payer: PPO

## 2017-10-21 ENCOUNTER — Encounter (HOSPITAL_COMMUNITY): Payer: Self-pay | Admitting: Emergency Medicine

## 2017-10-21 ENCOUNTER — Emergency Department (HOSPITAL_COMMUNITY)
Admission: EM | Admit: 2017-10-21 | Discharge: 2017-10-21 | Disposition: A | Discharge status: Home / Self Care | Payer: PPO | Attending: Emergency Medicine | Admitting: Emergency Medicine

## 2017-10-21 DIAGNOSIS — Z79899 Other long term (current) drug therapy: Secondary | ICD-10-CM | POA: Insufficient documentation

## 2017-10-21 DIAGNOSIS — I10 Essential (primary) hypertension: Secondary | ICD-10-CM | POA: Insufficient documentation

## 2017-10-21 DIAGNOSIS — R1032 Left lower quadrant pain: Secondary | ICD-10-CM | POA: Diagnosis not present

## 2017-10-21 DIAGNOSIS — N1339 Other hydronephrosis: Secondary | ICD-10-CM | POA: Diagnosis not present

## 2017-10-21 DIAGNOSIS — Z7901 Long term (current) use of anticoagulants: Secondary | ICD-10-CM | POA: Diagnosis not present

## 2017-10-21 DIAGNOSIS — R109 Unspecified abdominal pain: Secondary | ICD-10-CM | POA: Diagnosis present

## 2017-10-21 DIAGNOSIS — N200 Calculus of kidney: Secondary | ICD-10-CM | POA: Insufficient documentation

## 2017-10-21 LAB — COMPREHENSIVE METABOLIC PANEL
ALT: 15 U/L (ref 0–44)
ANION GAP: 9 (ref 5–15)
AST: 19 U/L (ref 15–41)
Albumin: 3.5 g/dL (ref 3.5–5.0)
Alkaline Phosphatase: 40 U/L (ref 38–126)
BUN: 17 mg/dL (ref 8–23)
CALCIUM: 8.9 mg/dL (ref 8.9–10.3)
CHLORIDE: 109 mmol/L (ref 98–111)
CO2: 25 mmol/L (ref 22–32)
CREATININE: 1.18 mg/dL — AB (ref 0.44–1.00)
GFR calc non Af Amer: 46 mL/min — ABNORMAL LOW (ref >60)
GFR, EST AFRICAN AMERICAN: 54 mL/min — AB (ref >60)
Glucose, Bld: 112 mg/dL — ABNORMAL HIGH (ref 70–99)
Potassium: 3.6 mmol/L (ref 3.5–5.1)
SODIUM: 143 mmol/L (ref 135–145)
TOTAL PROTEIN: 6.4 g/dL — AB (ref 6.5–8.1)
Total Bilirubin: 1.3 mg/dL — ABNORMAL HIGH (ref 0.3–1.2)

## 2017-10-21 LAB — URINALYSIS, ROUTINE W REFLEX MICROSCOPIC
BILIRUBIN URINE: NEGATIVE
GLUCOSE, UA: NEGATIVE mg/dL
Ketones, ur: NEGATIVE mg/dL
LEUKOCYTES UA: NEGATIVE
Nitrite: NEGATIVE
PROTEIN: 100 mg/dL — AB
RBC / HPF: >50 RBC/hpf — ABNORMAL HIGH (ref 0–5)
Specific Gravity, Urine: 1.017 (ref 1.005–1.030)
pH: 6 (ref 5.0–8.0)

## 2017-10-21 LAB — CBC WITH DIFFERENTIAL/PLATELET
ABS IMMATURE GRANULOCYTES: 0.02 10*3/uL (ref 0.00–0.07)
Basophils Absolute: 0.1 10*3/uL (ref 0.0–0.1)
Basophils Relative: 1 %
Eosinophils Absolute: 0.1 10*3/uL (ref 0.0–0.5)
Eosinophils Relative: 2 %
HCT: 31.2 % — ABNORMAL LOW (ref 36.0–46.0)
Hemoglobin: 10.6 g/dL — ABNORMAL LOW (ref 12.0–15.0)
Immature Granulocytes: 0 %
Lymphocytes Relative: 21 %
Lymphs Abs: 1.4 10*3/uL (ref 0.7–4.0)
MCH: 30.3 pg (ref 26.0–34.0)
MCHC: 34 g/dL (ref 30.0–36.0)
MCV: 89.1 fL (ref 80.0–100.0)
MONO ABS: 0.5 10*3/uL (ref 0.1–1.0)
Monocytes Relative: 7 %
NEUTROS ABS: 4.7 10*3/uL (ref 1.7–7.7)
NRBC: 0 % (ref 0.0–0.2)
Neutrophils Relative %: 69 %
PLATELETS: 152 10*3/uL (ref 150–400)
RBC: 3.5 MIL/uL — AB (ref 3.87–5.11)
RDW: 12.7 % (ref 11.5–15.5)
WBC: 6.8 10*3/uL (ref 4.0–10.5)

## 2017-10-21 LAB — URINE CULTURE
MICRO NUMBER:: 91232321
SPECIMEN QUALITY:: ADEQUATE

## 2017-10-21 MED ORDER — TAMSULOSIN HCL 0.4 MG PO CAPS: 0.4000 mg | ORAL_CAPSULE | Freq: Every day | ORAL | 0 refills | Status: AC

## 2017-10-21 MED ORDER — HYDROMORPHONE HCL 1 MG/ML IJ SOLN
0.5000 mg | Freq: Once | INTRAMUSCULAR | Status: AC
  Administered 2017-10-21: 0.5 mg via INTRAVENOUS
  Filled 2017-10-21: qty 1

## 2017-10-21 MED ORDER — SODIUM CHLORIDE 0.9 % IV BOLUS
1000.0000 mL | Freq: Once | INTRAVENOUS | Status: AC
  Administered 2017-10-21: 1000 mL via INTRAVENOUS

## 2017-10-21 MED ORDER — ONDANSETRON HCL 4 MG PO TABS: 4.0000 mg | ORAL_TABLET | Freq: Three times a day (TID) | ORAL | 0 refills | Status: DC | PRN

## 2017-10-21 MED ORDER — HYDROMORPHONE HCL 1 MG/ML IJ SOLN
1.0000 mg | Freq: Once | INTRAMUSCULAR | Status: AC
  Administered 2017-10-21: 1 mg via INTRAVENOUS
  Filled 2017-10-21: qty 1

## 2017-10-21 MED ORDER — ONDANSETRON HCL 4 MG/2ML IJ SOLN
4.0000 mg | Freq: Once | INTRAMUSCULAR | Status: AC
  Administered 2017-10-21: 4 mg via INTRAVENOUS
  Filled 2017-10-21: qty 2

## 2017-10-21 MED ORDER — OXYCODONE-ACETAMINOPHEN 5-325 MG PO TABS: 1.0000 | ORAL_TABLET | ORAL | 0 refills | Status: AC | PRN

## 2017-10-21 NOTE — Discharge Instructions (Signed)
If you experience any fever, chills, worsening symptoms please return to the ED for reevaluation. Please follow up with your Urologist as needed. Make sure to complete antibiotic therapy as prescribed by your PCP.

## 2017-10-21 NOTE — ED Provider Notes (Signed)
Patient seen/examined in the Emergency Department in conjunction with Midlevel Provider [place] Patient reports left flank pain, similar to prior kidney stones Exam : Awake alert, patient comfortable, left flank tenderness noted Plan: Obtain CT imaging to evaluate for ureteral stone Pt agreeable with plan    Ripley Fraise, MD 10/21/17 [phone]

## 2017-10-21 NOTE — ED Triage Notes (Signed)
Pt from home with c/o left flank pain that began on Sunday with hematuria. Pt now has left flank and inguinal pain. Pt has hx of kidney stones. Pt saw her doctor who gave her rx for abx. Pt rates pain 7/10. Pt is afebrile

## 2017-10-21 NOTE — ED Provider Notes (Signed)
[facility] DEPT Provider Note   CSN: [id number] Arrival date & time: 10/21/17  0556     History   Chief Complaint Chief Complaint  Patient presents with  . Flank Pain    HPI Anna Marquez is a 67 y.o. female.  67 y/o female with a PMH of Cholelithiasis, GERD, HTN presents to the ED with a chief complaint of left flank pain which began this morning. Patient was seen by her PCP yesterday and started on ciprofloxacin for hematuria. Patient has taken one dose of ciprofloxacin. She reports waking up this morning with a sharp pain to her left flank radiating to the front. She has not tried any therapy for relieve. She reports " I think im having a kidney stone".She denies any fever, shortness of breath or chest pain.      Past Medical History:  Diagnosis Date  . Adenomatous colon polyp 2003  . Cholelithiasis   . Diverticulosis of colon   . Gastric polyps   . GERD (gastroesophageal reflux disease)   . Helicobacter pylori (H. pylori)   . Hiatal hernia   . Hypertension   . Mitral valve prolapse   . Nephrolithiasis   . Osteoarthritis   . Osteopenia 03/2017   T score -1.5 FRAX 8.9% / 0.9%.  Stable from prior DEXA 2016  . TGA (transient global amnesia)     Patient Active Problem List   Diagnosis Date Noted  . Palpitations 01/31/2017  . Cough 11/21/2016  . TIA (transient ischemic attack) 03/07/2016  . Essential hypertension 01/27/2012  . Migraine without aura 05/20/2008  . Allergic rhinitis 05/20/2008  . Mitral regurgitation 08/10/2007  . GERD 08/10/2007  . Diaphragmatic hernia 08/10/2007  . DIVERTICULOSIS, COLON 08/10/2007  . Osteoarthritis 08/10/2007  . COLONIC POLYPS, ADENOMATOUS, HX OF 08/10/2007    Past Surgical History:  Procedure Laterality Date  . ANTERIOR AND POSTERIOR REPAIR N/A 06/14/2014   Procedure: ANTERIOR (CYSTOCELE) AND POSTERIOR REPAIR (RECTOCELE);  Surgeon: Anastasio Auerbach, MD;  Location: [facility];  Service:  Gynecology;  Laterality: N/A;  . CHOLECYSTECTOMY    . kidney stone removal     x2  . VAGINAL HYSTERECTOMY N/A 06/14/2014   Procedure: HYSTERECTOMY VAGINAL;  Surgeon: Anastasio Auerbach, MD;  Location: [facility];  Service: Gynecology;  Laterality: N/A;     OB History    Gravida  2   Para  2   Term      Preterm      AB      Living  2     SAB      TAB      Ectopic      Multiple      Live Births               Home Medications    Prior to Admission medications   Medication Sig Start Date End Date Taking? Authorizing Provider  apixaban (ELIQUIS) 5 MG TABS tablet Take 1 tablet (5 mg total) by mouth 2 (two) times daily. 05/23/17  Yes Nahser, Wonda Cheng, MD  ciprofloxacin (CIPRO) 500 MG tablet Take 1 tablet (500 mg total) by mouth 2 (two) times daily for 5 days. 10/20/17 10/25/17 Yes Hoyt Koch, MD  hydrochlorothiazide (HYDRODIURIL) 25 MG tablet Take 1 tablet (25 mg total) by mouth daily. 01/31/17  Yes Hoyt Koch, MD  lisinopril (PRINIVIL,ZESTRIL) 10 MG tablet TAKE 1 TABLET BY MOUTH DAILY. Patient taking differently: Take 10 mg by mouth daily.  12/02/16  Yes Dorena Cookey, MD  nadolol (CORGARD) 40 MG tablet TAKE 1 TABLET BY MOUTH TWICE DAILY. Patient taking differently: Take 40 mg by mouth 2 (two) times daily.  02/17/17  Yes Dorena Cookey, MD  pantoprazole (PROTONIX) 40 MG tablet TAKE 1 TABLET BY MOUTH EVERY DAY Patient taking differently: Take 40 mg by mouth daily.  06/05/17  Yes Dorena Cookey, MD  albuterol (VENTOLIN HFA) 108 (90 Base) MCG/ACT inhaler Inhale 1-2 puffs into the lungs every 6 (six) hours as needed for wheezing or shortness of breath. 01/31/17   Hoyt Koch, MD  fluticasone (FLONASE) 50 MCG/ACT nasal spray USE ONE SPRAY IN EACH NOSTRIL DAILY. Patient taking differently: Place 1 spray into both nostrils daily as needed for allergies or rhinitis.  01/31/17   Hoyt Koch, MD  ipratropium (ATROVENT) 0.03 % nasal spray Place 2  sprays into both nostrils 2 (two) times daily. Do not use for more than 5days. Patient taking differently: Place 2 sprays into both nostrils 2 (two) times daily as needed for rhinitis. Do not use for more than 5days. 03/20/17   Nche, Charlene Brooke, NP  nadolol (CORGARD) 40 MG tablet TAKE 1 TABLET BY MOUTH TWICE DAILY. Patient not taking: Reported on 10/21/2017 10/01/17   Dorena Cookey, MD  oxyCODONE-acetaminophen (PERCOCET/ROXICET) 5-325 MG tablet Take 1 tablet by mouth every 4 (four) hours as needed for up to 3 days for severe pain. 10/21/17 10/24/17  Janeece Fitting, PA-C    Family History Family History  Problem Relation Age of Onset  . Cancer Father        prostate  . Breast cancer Paternal Aunt 57  . Diabetes Maternal Grandmother   . Heart disease Maternal Grandmother   . Heart disease Maternal Grandfather   . Melanoma Paternal Aunt   . Neuropathy Mother   . Congestive Heart Failure Mother     Social History Social History   Tobacco Use  . Smoking status: Never Smoker  . Smokeless tobacco: Never Used  Substance Use Topics  . Alcohol use: Yes    Alcohol/week: 0.0 standard drinks    Comment: Occas  . Drug use: No     Allergies   Penicillins; Ibuprofen; and Erythromycin   Review of Systems Review of Systems  Constitutional: Negative for chills and fever.  HENT: Negative for sore throat.   Respiratory: Negative for shortness of breath.   Cardiovascular: Negative for chest pain.  Gastrointestinal: Negative for abdominal pain, diarrhea, nausea and vomiting.  Genitourinary: Positive for flank pain and hematuria.  Musculoskeletal: Negative for back pain.  Skin: Negative for pallor and wound.  Neurological: Negative for light-headedness and headaches.  All other systems reviewed and are negative.    Physical Exam Updated Vital Signs BP (!) 141/92   Pulse 83   Temp 97.8 F (36.6 C) (Oral)   Resp 16   SpO2 97%   Physical Exam  Constitutional: She is oriented to  person, place, and time. She appears well-developed and well-nourished.  HENT:  Head: Normocephalic and atraumatic.  Neck: Normal range of motion. Neck supple.  Cardiovascular: Normal heart sounds.  Pulmonary/Chest: Breath sounds normal. She has no wheezes.  Abdominal: Soft. There is no tenderness.  Musculoskeletal: She exhibits no tenderness.  Neurological: She is alert and oriented to person, place, and time.  Skin: Skin is warm and dry.  Nursing note and vitals reviewed.    ED Treatments / Results  Labs (all labs ordered are listed, but only abnormal  results are displayed) Labs Reviewed  URINALYSIS, ROUTINE W REFLEX MICROSCOPIC - Abnormal; Notable for the following components:      Result Value   Color, Urine RED (*)    APPearance CLOUDY (*)    Hgb urine dipstick LARGE (*)    Protein, ur 100 (*)    RBC / HPF >50 (*)    Bacteria, UA FEW (*)    All other components within normal limits  CBC WITH DIFFERENTIAL/PLATELET - Abnormal; Notable for the following components:   RBC 3.50 (*)    Hemoglobin 10.6 (*)    HCT 31.2 (*)    All other components within normal limits  COMPREHENSIVE METABOLIC PANEL - Abnormal; Notable for the following components:   Glucose, Bld 112 (*)    Creatinine, Ser 1.18 (*)    Total Protein 6.4 (*)    Total Bilirubin 1.3 (*)    GFR calc non Af Amer 46 (*)    GFR calc Af Amer 54 (*)    All other components within normal limits    EKG None  Radiology Ct Renal Stone Study  Result Date: 10/21/2017 CLINICAL DATA:  Left flank and groin pain for 3 days. Gross hematuria. EXAM: CT ABDOMEN AND PELVIS WITHOUT CONTRAST TECHNIQUE: Multidetector CT imaging of the abdomen and pelvis was performed following the standard protocol without IV contrast. COMPARISON:  CT abdomen and pelvis 08/21/2015. FINDINGS: Lower chest: Heart size is upper normal. No pleural or pericardial effusion. Mild dependent atelectasis in the lung bases is noted. Hepatobiliary: The patient is  status post cholecystectomy. A single calcification is seen in the right lobe of the liver, unchanged. The liver is otherwise unremarkable. Biliary tree appears normal. Pancreas: Unremarkable. No pancreatic ductal dilatation or surrounding inflammatory changes. Spleen: Normal in size without focal abnormality. Adrenals/Urinary Tract: The patient has moderate left hydronephrosis due to a 0.4 cm stone in the proximal left ureter. Also seen is a punctate nonobstructing stone in the lower pole of the left kidney. No right renal or ureteral stones are identified. No urinary bladder stones. The adrenal glands appear normal. Stomach/Bowel: Diverticulosis without diverticulitis is most notable in the descending and sigmoid colon. The remainder of the colon appears normal. Moderate hiatal hernia is seen. Small bowel and appendix are unremarkable. Vascular/Lymphatic: Aortic atherosclerosis. No enlarged abdominal or pelvic lymph nodes. Retroaortic left renal vein incidentally noted. Reproductive: Uterus and bilateral adnexa are unremarkable. Other: None. Musculoskeletal: No acute or focal abnormality. IMPRESSION: Moderate left hydronephrosis due to a 0.4 cm stone in the proximal left ureter. Punctate nonobstructing stone lower pole left kidney also noted. Moderate hiatal hernia. Diverticulosis without diverticulitis. Atherosclerosis. Electronically Signed   By: Inge Rise M.D.   On: 10/21/2017 07:31    Procedures Procedures (including critical care time)  Medications Ordered in ED Medications  HYDROmorphone (DILAUDID) injection 0.5 mg (has no administration in time range)  sodium chloride 0.9 % bolus 1,000 mL (0 mLs Intravenous Stopped 10/21/17 0844)  HYDROmorphone (DILAUDID) injection 1 mg (1 mg Intravenous Given 10/21/17 0641)     Initial Impression / Assessment and Plan / ED Course  I have reviewed the triage vital signs and the nursing notes.  Pertinent labs & imaging results that were available  during my care of the patient were reviewed by me and considered in my medical decision making (see chart for details).    Patient presents with left flank pain which began this morning, she is currently being treated for her hematuria by her PCP with  ciprofloxacin and has had one dose.  Workup in the ED showed CBC no leukocytosis, patient is afebrile and reports relieve of pain with dilaudid 1mg , less likely pyelo she appears non-toxic. UA showed large Hgb, few bacteria no nitrites or leukocytes she denies any dysuria. CT Renal stone study showed: Moderate left hydronephrosis due to a 0.4 cm stone in the proximal left ureter. Punctate nonobstructing stone lower pole left kidney also noted. Patient reports pain is returning, been treated outpatient with Percocet which she reports works well for her kidney stones.  Reports she has a urologist Dr. Tivis Ringer who she will follow up with. Patient is currently on antibiotics. I have advised patient I will send her home with tamsulosin to help with stone expulsion but she is advised to return if she experiences any fever, chills, worsening symptoms. Patient understand and agrees with plan.I have discussed this case with Dr. Christy Gentles who has seen patient. I have also dicussed this case with Dr. Lita Mains prior to discharging patient. Vitals stable for discharge, patient stable for discharge.   Final Clinical Impressions(s) / ED Diagnoses   Final diagnoses:  Left flank pain  Nephrolithiasis    ED Discharge Orders         Ordered    oxyCODONE-acetaminophen (PERCOCET/ROXICET) 5-325 MG tablet  Every 4 hours PRN     10/21/17 0858           Janeece Fitting, PA-C 10/21/17 0900    Ripley Fraise, MD 10/21/17 2305

## 2017-10-22 ENCOUNTER — Encounter: Payer: Self-pay | Admitting: Internal Medicine

## 2017-10-22 DIAGNOSIS — N3000 Acute cystitis without hematuria: Secondary | ICD-10-CM | POA: Insufficient documentation

## 2017-10-22 DIAGNOSIS — K529 Noninfective gastroenteritis and colitis, unspecified: Secondary | ICD-10-CM | POA: Insufficient documentation

## 2017-10-22 NOTE — Assessment & Plan Note (Signed)
More likely not related to hematuria at this time but could have precipitated some ATN due to dehydration. Needs CMP today to evaluate for kidney function. Encouraged hydration and given BRAT diet.

## 2017-10-22 NOTE — Assessment & Plan Note (Signed)
Could be related to evidence of UTI or related to recent GI bug (shiga toxin although more common in children). Will check CBC and CMP to rule out ATN causing blood in urine and to check blood counts for anemia to help quantify blood loss.

## 2017-10-22 NOTE — Assessment & Plan Note (Signed)
Rx for ciprofloxacin in case this represents infection in the kidney as well given significant hematuria. If hematuria does not resolve with treatment of UTI needs evaluation with urology. U/A done in the office consistent with infection.

## 2017-10-23 ENCOUNTER — Other Ambulatory Visit: Payer: Self-pay | Admitting: Family Medicine

## 2017-10-23 NOTE — Telephone Encounter (Signed)
Patient need to schedule an ov for more refills. 

## 2017-10-24 NOTE — Telephone Encounter (Signed)
Left message to return phone call. Pt needs an ov for further refills. Last ov HTN 01/2016.

## 2017-10-28 ENCOUNTER — Ambulatory Visit (INDEPENDENT_AMBULATORY_CARE_PROVIDER_SITE_OTHER): Payer: PPO | Admitting: Family Medicine

## 2017-10-28 ENCOUNTER — Inpatient Hospital Stay: Payer: PPO | Admitting: Internal Medicine

## 2017-10-28 ENCOUNTER — Encounter: Payer: Self-pay | Admitting: Family Medicine

## 2017-10-28 VITALS — BP 100/60 | HR 86 | Temp 98.4°F | Wt 168.0 lb

## 2017-10-28 DIAGNOSIS — I10 Essential (primary) hypertension: Secondary | ICD-10-CM | POA: Diagnosis not present

## 2017-10-28 DIAGNOSIS — Z23 Encounter for immunization: Secondary | ICD-10-CM | POA: Diagnosis not present

## 2017-10-28 DIAGNOSIS — K449 Diaphragmatic hernia without obstruction or gangrene: Secondary | ICD-10-CM | POA: Diagnosis not present

## 2017-10-28 DIAGNOSIS — Z0000 Encounter for general adult medical examination without abnormal findings: Secondary | ICD-10-CM | POA: Diagnosis not present

## 2017-10-28 DIAGNOSIS — J309 Allergic rhinitis, unspecified: Secondary | ICD-10-CM

## 2017-10-28 DIAGNOSIS — Z13220 Encounter for screening for lipoid disorders: Secondary | ICD-10-CM | POA: Insufficient documentation

## 2017-10-28 LAB — POCT URINALYSIS DIPSTICK
Bilirubin, UA: NEGATIVE
Blood, UA: NEGATIVE
GLUCOSE UA: NEGATIVE
Ketones, UA: NEGATIVE
LEUKOCYTES UA: NEGATIVE
Nitrite, UA: NEGATIVE
Protein, UA: NEGATIVE
Urobilinogen, UA: 0.2 E.U./dL
pH, UA: 5 (ref 5.0–8.0)

## 2017-10-28 MED ORDER — PANTOPRAZOLE SODIUM 40 MG PO TBEC: 40.0000 mg | DELAYED_RELEASE_TABLET | Freq: Every day | ORAL | 4 refills | Status: DC

## 2017-10-28 MED ORDER — NADOLOL 40 MG PO TABS: ORAL_TABLET | ORAL | 4 refills | Status: DC

## 2017-10-28 MED ORDER — LISINOPRIL 10 MG PO TABS: 10.0000 mg | ORAL_TABLET | Freq: Every day | ORAL | 3 refills | Status: DC

## 2017-10-28 MED ORDER — ALBUTEROL SULFATE HFA 108 (90 BASE) MCG/ACT IN AERS: 1.0000 | INHALATION_SPRAY | Freq: Four times a day (QID) | RESPIRATORY_TRACT | 0 refills | Status: DC | PRN

## 2017-10-28 MED ORDER — HYDROCHLOROTHIAZIDE 25 MG PO TABS: 25.0000 mg | ORAL_TABLET | Freq: Every day | ORAL | 3 refills | Status: DC

## 2017-10-28 MED ORDER — FLUTICASONE PROPIONATE 50 MCG/ACT NA SUSP: NASAL | 11 refills | Status: DC

## 2017-10-28 NOTE — Progress Notes (Signed)
Anna Marquez is a 67 year old female who comes in today for annual evaluation because of the following issues  She has a history of underlying hypertension.  She is on Zestril 10 mg daily, Corgard 40 mg twice daily, hydrochlorothiazide 25 mg daily.  BP today is 100/60.  She is on Eliquis 5 mg twice daily because of a history of PAF.  She sees cardiologist on a regular basis  She has a history of mild asthma related to viral infections and she uses albuterol as needed once or twice a year  She has a history of allergic rhinitis for which he uses Flonase nasal spray  She has history of reflux esophagitis for which she is Protonix 40 mg daily.  She recently passed a kidney stone.  She brings in the stone is 8 mm.  She seen urology in the past.  Her urologist is retiring.  Dr. Risa Grill.  Asked her to see 1 of the new folks  She is been to see neurology because of the neuropathy.  She is been diagnosed to have carpal tunnel syndrome.  She is also having difficulty with pain in her left shoulder.  The center to physical therapy it helped but she still has marked pain and decreased range of motion in that shoulder  She had her uterus removed many years ago for nonmalignant reasons.  Ovaries were left intact.  She is asymptomatic.  She sees Dr. [name] on a regular basis.  She is due for follow-up colonoscopy  She gets routine eye care, dental care, BSE monthly, annual mammography, colonoscopy as noted above.  Cognitive function normal she exercises on a daily basis home health safety reviewed no issues identified, no guns in the house, she does have a healthcare power of attorney and living well.  .vs BP 100/60 (BP Location: Right Arm, Patient Position: Sitting, Cuff Size: Normal)   Pulse 86   Temp 98.4 F (36.9 C) (Oral)   Wt 168 lb (76.2 kg)   SpO2 98%   BMI 28.84 kg/m  Well-developed well-nourished female no acute distress vital signs stable she is afebrile examination HEENT is pertinent she has  early bilateral cataracts.  Neck was supple thyroid is not enlarged no carotid bruits.  Cardiopulmonary exam normal.  Breast exam normal.  Abdominal exam normal.  Pelvic and rectal not indicated.  Extremities normal skin normal peripheral pulses normal  1.  Hypertension at goal......... continue current therapy  2.  History of PAF....... continue Eliquis.... Followed by cardiology  3.  Allergic rhinitis.......... continue current meds  4.  Reflux esophagitis............. continue Protonix  5.  Recently passed an 8 mm kidney stone.  Scan shows another stone there............[name] recommend she call [facility] for consult.

## 2017-10-28 NOTE — Patient Instructions (Signed)
Labs today............... I will call if there is anything abnormal  Continue current meds  Call [facility]........ to get set up with 1 of the new young urologist  Call GI to find out when you are due for your next colonoscopy  Thank you for coming to see me all these years.  My best to you and your family!!!!!!!!!!!!!!!!!!!

## 2017-10-29 LAB — BASIC METABOLIC PANEL
BUN: 23 mg/dL (ref 6–23)
CHLORIDE: 100 meq/L (ref 96–112)
CO2: 28 meq/L (ref 19–32)
Calcium: 10.1 mg/dL (ref 8.4–10.5)
Creatinine, Ser: 1.28 mg/dL — ABNORMAL HIGH (ref 0.40–1.20)
GFR: 43.98 mL/min — ABNORMAL LOW (ref >60.00)
GLUCOSE: 89 mg/dL (ref 70–99)
POTASSIUM: 3.8 meq/L (ref 3.5–5.1)
SODIUM: 139 meq/L (ref 135–145)

## 2017-10-29 LAB — CBC WITH DIFFERENTIAL/PLATELET
BASOS ABS: 0.1 10*3/uL (ref 0.0–0.1)
Basophils Relative: 1.2 % (ref 0.0–3.0)
EOS ABS: 0.2 10*3/uL (ref 0.0–0.7)
Eosinophils Relative: 2.4 % (ref 0.0–5.0)
HEMATOCRIT: 37.2 % (ref 36.0–46.0)
HEMOGLOBIN: 13 g/dL (ref 12.0–15.0)
LYMPHS PCT: 32.8 % (ref 12.0–46.0)
Lymphs Abs: 2.2 10*3/uL (ref 0.7–4.0)
MCHC: 34.8 g/dL (ref 30.0–36.0)
MCV: 88 fl (ref 78.0–100.0)
MONOS PCT: 7.1 % (ref 3.0–12.0)
Monocytes Absolute: 0.5 10*3/uL (ref 0.1–1.0)
Neutro Abs: 3.8 10*3/uL (ref 1.4–7.7)
Neutrophils Relative %: 56.5 % (ref 43.0–77.0)
Platelets: 210 10*3/uL (ref 150.0–400.0)
RBC: 4.23 Mil/uL (ref 3.87–5.11)
RDW: 13.8 % (ref 11.5–15.5)
WBC: 6.8 10*3/uL (ref 4.0–10.5)

## 2017-10-29 LAB — LIPID PANEL
CHOL/HDL RATIO: 3
CHOLESTEROL: 194 mg/dL (ref 0–200)
HDL: 57.1 mg/dL (ref >39.00)
LDL CALC: 103 mg/dL — AB (ref 0–99)
NonHDL: 136.94
Triglycerides: 170 mg/dL — ABNORMAL HIGH (ref 0.0–149.0)
VLDL: 34 mg/dL (ref 0.0–40.0)

## 2017-10-29 LAB — HEPATIC FUNCTION PANEL
ALT: 12 U/L (ref 0–35)
AST: 12 U/L (ref 0–37)
Albumin: 4.5 g/dL (ref 3.5–5.2)
Alkaline Phosphatase: 49 U/L (ref 39–117)
Bilirubin, Direct: 0.2 mg/dL (ref 0.0–0.3)
TOTAL PROTEIN: 7.7 g/dL (ref 6.0–8.3)
Total Bilirubin: 1.1 mg/dL (ref 0.2–1.2)

## 2017-10-29 LAB — TSH: TSH: 0.3 u[IU]/mL — ABNORMAL LOW (ref 0.35–4.50)

## 2017-12-23 ENCOUNTER — Other Ambulatory Visit: Payer: PPO

## 2017-12-23 DIAGNOSIS — I480 Paroxysmal atrial fibrillation: Secondary | ICD-10-CM | POA: Diagnosis not present

## 2017-12-23 DIAGNOSIS — I10 Essential (primary) hypertension: Secondary | ICD-10-CM | POA: Diagnosis not present

## 2017-12-23 LAB — BASIC METABOLIC PANEL
BUN / CREAT RATIO: 22 (ref 12–28)
BUN: 21 mg/dL (ref 8–27)
CALCIUM: 10.1 mg/dL (ref 8.7–10.3)
CO2: 23 mmol/L (ref 20–29)
CREATININE: 0.94 mg/dL (ref 0.57–1.00)
Chloride: 101 mmol/L (ref 96–106)
GFR calc Af Amer: 72 mL/min/{1.73_m2} (ref >59)
GFR calc non Af Amer: 63 mL/min/{1.73_m2} (ref >59)
GLUCOSE: 102 mg/dL — AB (ref 65–99)
Potassium: 3.8 mmol/L (ref 3.5–5.2)
Sodium: 142 mmol/L (ref 134–144)

## 2017-12-23 LAB — CBC
HEMATOCRIT: 31.1 % — AB (ref 34.0–46.6)
HEMOGLOBIN: 11.2 g/dL (ref 11.1–15.9)
MCH: 30.3 pg (ref 26.6–33.0)
MCHC: 36 g/dL — ABNORMAL HIGH (ref 31.5–35.7)
MCV: 84 fL (ref 79–97)
Platelets: 179 10*3/uL (ref 150–450)
RBC: 3.7 x10E6/uL — AB (ref 3.77–5.28)
RDW: 12.9 % (ref 12.3–15.4)
WBC: 5.4 10*3/uL (ref 3.4–10.8)

## 2018-01-12 ENCOUNTER — Telehealth: Payer: Self-pay

## 2018-01-12 NOTE — Telephone Encounter (Signed)
Copied from [place] [id number]. Topic: Appointment Scheduling - Scheduling Inquiry for Clinic >> Jan 12, 2018  3:10 PM Wynetta Emery, [place] C wrote: Reason for CRM: pt called in to schedule TOC with Dr. Deborra Medina. Pt use to be a pt of Dr. Sherren Mocha and has been seen at the [facility] location for acute concerns. Pt says that she love it at the [place] office. Pt has [employer]. Pt insist that I send approval message to Dr. Deborra Medina.    Please advise.

## 2018-01-14 NOTE — Telephone Encounter (Signed)
Please advise 

## 2018-01-14 NOTE — Telephone Encounter (Signed)
I am limiting new patients now- please schedule her with Dr. Bryan Lemma or Dr. Zigmund Daniel.

## 2018-01-16 ENCOUNTER — Encounter: Payer: Self-pay | Admitting: Emergency Medicine

## 2018-01-16 ENCOUNTER — Ambulatory Visit
Admission: EM | Admit: 2018-01-16 | Discharge: 2018-01-16 | Disposition: A | Discharge status: Home / Self Care | Payer: PPO

## 2018-01-16 DIAGNOSIS — J209 Acute bronchitis, unspecified: Secondary | ICD-10-CM | POA: Insufficient documentation

## 2018-01-16 MED ORDER — DOXYCYCLINE HYCLATE 100 MG PO CAPS: 100.0000 mg | ORAL_CAPSULE | Freq: Two times a day (BID) | ORAL | 0 refills | Status: AC

## 2018-01-16 MED ORDER — ALBUTEROL SULFATE HFA 108 (90 BASE) MCG/ACT IN AERS: 1.0000 | INHALATION_SPRAY | Freq: Four times a day (QID) | RESPIRATORY_TRACT | 0 refills | Status: DC | PRN

## 2018-01-16 MED ORDER — BENZONATATE 200 MG PO CAPS: 200.0000 mg | ORAL_CAPSULE | Freq: Three times a day (TID) | ORAL | 0 refills | Status: AC | PRN

## 2018-01-16 MED ORDER — PREDNISONE 50 MG PO TABS: 50.0000 mg | ORAL_TABLET | Freq: Every day | ORAL | 0 refills | Status: AC

## 2018-01-16 MED ORDER — HYDROCODONE-HOMATROPINE 5-1.5 MG/5ML PO SYRP: 5.0000 mL | ORAL_SOLUTION | Freq: Every evening | ORAL | 0 refills | Status: DC | PRN

## 2018-01-16 NOTE — ED Notes (Signed)
Patient complains of dry cough, sore throat, and headache X 4 days. Hx of asthma.

## 2018-01-16 NOTE — ED Provider Notes (Signed)
[name]    CSN: [id number] Arrival date & time: 01/16/18  1333     History   Chief Complaint Chief Complaint  Patient presents with  . URI    HPI Anna Marquez is a 68 y.o. female history of osteoarthritis, GERD, hypertension presenting today for evaluation of cough.  Patient states that beginning last weekend, 6 and 7 days ago she began to develop a dry throat and a mild cough.  Over Monday and Tuesday symptoms worsened along with a sore throat.  Nasal congestion has relatively resolved along with a sore throat.  She has had persistent and worsening cough since.  She has began to feel short of breath.  Cough keeping her up at nighttime.  Denies any fevers.  She has been using Claritin and nasal spray.  She is also been using albuterol inhaler as needed.  States she has gets a history of asthmatic bronchitis easily.  HPI  Past Medical History:  Diagnosis Date  . Adenomatous colon polyp 2003  . Cholelithiasis   . Diverticulosis of colon   . Gastric polyps   . GERD (gastroesophageal reflux disease)   . Helicobacter pylori (H. pylori)   . Hiatal hernia   . Hypertension   . Mitral valve prolapse   . Nephrolithiasis   . Osteoarthritis   . Osteopenia 03/2017   T score -1.5 FRAX 8.9% / 0.9%.  Stable from prior DEXA 2016  . TGA (transient global amnesia)     Patient Active Problem List   Diagnosis Date Noted  . Routine general medical examination at a health care facility 10/28/2017  . Acute cystitis 10/22/2017  . Palpitations 01/31/2017  . Cough 11/21/2016  . TIA (transient ischemic attack) 03/07/2016  . Hematuria 12/06/2015  . Essential hypertension 01/27/2012  . Migraine without aura 05/20/2008  . Allergic rhinitis 05/20/2008  . Mitral regurgitation 08/10/2007  . GERD 08/10/2007  . Diaphragmatic hernia 08/10/2007  . DIVERTICULOSIS, COLON 08/10/2007  . Osteoarthritis 08/10/2007  . COLONIC POLYPS, ADENOMATOUS, HX OF 08/10/2007    Past Surgical  History:  Procedure Laterality Date  . ANTERIOR AND POSTERIOR REPAIR N/A 06/14/2014   Procedure: ANTERIOR (CYSTOCELE) AND POSTERIOR REPAIR (RECTOCELE);  Surgeon: Anastasio Auerbach, MD;  Location: [facility];  Service: Gynecology;  Laterality: N/A;  . CHOLECYSTECTOMY    . kidney stone removal     x2  . VAGINAL HYSTERECTOMY N/A 06/14/2014   Procedure: HYSTERECTOMY VAGINAL;  Surgeon: Anastasio Auerbach, MD;  Location: [facility];  Service: Gynecology;  Laterality: N/A;    OB History    Gravida  2   Para  2   Term      Preterm      AB      Living  2     SAB      TAB      Ectopic      Multiple      Live Births               Home Medications    Prior to Admission medications   Medication Sig Start Date End Date Taking? Authorizing Provider  apixaban (ELIQUIS) 5 MG TABS tablet Take 1 tablet (5 mg total) by mouth 2 (two) times daily. 05/23/17  Yes Nahser, Wonda Cheng, MD  fluticasone (FLONASE) 50 MCG/ACT nasal spray USE ONE SPRAY IN EACH NOSTRIL DAILY. 10/28/17  Yes Dorena Cookey, MD  hydrochlorothiazide (HYDRODIURIL) 25 MG tablet Take 1 tablet (25 mg total) by mouth daily.  10/28/17  Yes Dorena Cookey, MD  lisinopril (PRINIVIL,ZESTRIL) 10 MG tablet Take 1 tablet (10 mg total) by mouth daily. 10/28/17  Yes Dorena Cookey, MD  loratadine (CLARITIN) 10 MG tablet Take 10 mg by mouth daily.   Yes [provider]  nadolol (CORGARD) 40 MG tablet TAKE 1 TABLET BY MOUTH TWICE DAILY. 10/28/17  Yes Dorena Cookey, MD  pantoprazole (PROTONIX) 40 MG tablet Take 1 tablet (40 mg total) by mouth daily. 10/28/17  Yes Dorena Cookey, MD  albuterol (PROVENTIL HFA;VENTOLIN HFA) 108 (90 Base) MCG/ACT inhaler Inhale 1-2 puffs into the lungs every 6 (six) hours as needed for wheezing or shortness of breath. 01/16/18   Harjas Biggins C, PA-C  benzonatate (TESSALON) 200 MG capsule Take 1 capsule (200 mg total) by mouth 3 (three) times daily as needed for up to 7 days for cough. 01/16/18 01/23/18   Irasema Chalk C, PA-C  doxycycline (VIBRAMYCIN) 100 MG capsule Take 1 capsule (100 mg total) by mouth 2 (two) times daily for 10 days. 01/19/18 01/29/18  Kendric Sindelar C, PA-C  HYDROcodone-homatropine (HYCODAN) 5-1.5 MG/5ML syrup Take 5 mLs by mouth at bedtime as needed for cough. 01/16/18   Caran Storck C, PA-C  ipratropium (ATROVENT) 0.03 % nasal spray Place 2 sprays into both nostrils 2 (two) times daily. Do not use for more than 5days. Patient taking differently: Place 2 sprays into both nostrils 2 (two) times daily as needed for rhinitis. Do not use for more than 5days. 03/20/17   Nche, Charlene Brooke, NP  predniSONE (DELTASONE) 50 MG tablet Take 1 tablet (50 mg total) by mouth daily with breakfast for 5 days. 01/16/18 01/21/18  Suann Klier, Elesa Hacker, PA-C    Family History Family History  Problem Relation Age of Onset  . Cancer Father        prostate  . Breast cancer Paternal Aunt 13  . Diabetes Maternal Grandmother   . Heart disease Maternal Grandmother   . Heart disease Maternal Grandfather   . Melanoma Paternal Aunt   . Neuropathy Mother   . Congestive Heart Failure Mother     Social History Social History   Tobacco Use  . Smoking status: Never Smoker  . Smokeless tobacco: Never Used  Substance Use Topics  . Alcohol use: Yes    Alcohol/week: 0.0 standard drinks    Comment: Occas  . Drug use: No     Allergies   Penicillins; Ibuprofen; and Erythromycin   Review of Systems Review of Systems  Constitutional: Negative for activity change, appetite change, chills, fatigue and fever.  HENT: Positive for congestion. Negative for ear pain, rhinorrhea, sinus pressure, sore throat and trouble swallowing.   Eyes: Negative for discharge and redness.  Respiratory: Positive for cough and shortness of breath. Negative for chest tightness and wheezing.   Cardiovascular: Negative for chest pain.  Gastrointestinal: Negative for abdominal pain, diarrhea, nausea and vomiting.    Musculoskeletal: Negative for myalgias.  Skin: Negative for rash.  Neurological: Negative for dizziness, light-headedness and headaches.     Physical Exam Triage Vital Signs ED Triage Vitals  Enc Vitals Group     BP 01/16/18 1344 (!) 144/88     Pulse Rate 01/16/18 1344 86     Resp 01/16/18 1344 18     Temp 01/16/18 1344 98.1 F (36.7 C)     Temp Source 01/16/18 1344 Oral     SpO2 01/16/18 1344 96 %     Weight --  Height --      Head Circumference --      Peak Flow --      Pain Score 01/16/18 1345 2     Pain Loc --      Pain Edu? --      Excl. in [place]? --    No data found.  Updated Vital Signs BP (!) 144/88 (BP Location: Left Arm)   Pulse 86   Temp 98.1 F (36.7 C) (Oral)   Resp 18   SpO2 96%   Visual Acuity Right Eye Distance:   Left Eye Distance:   Bilateral Distance:    Right Eye Near:   Left Eye Near:    Bilateral Near:     Physical Exam Vitals signs and nursing note reviewed.  Constitutional:      General: She is not in acute distress.    Appearance: She is well-developed.  HENT:     Head: Normocephalic and atraumatic.     Ears:     Comments: Bilateral ears without tenderness to palpation of external auricle, tragus and mastoid, EAC's without erythema or swelling, TM's with good bony landmarks and cone of light. Non erythematous.    Nose:     Comments: Nasal mucosa erythematous, no rhinorrhea present bilaterally    Mouth/Throat:     Comments: Oral mucosa pink and moist, no tonsillar enlargement or exudate. Posterior pharynx patent and nonerythematous, no uvula deviation or swelling. Normal phonation. Eyes:     Conjunctiva/sclera: Conjunctivae normal.  Neck:     Musculoskeletal: Neck supple.  Cardiovascular:     Rate and Rhythm: Normal rate and regular rhythm.     Heart sounds: No murmur.  Pulmonary:     Effort: Pulmonary effort is normal. No respiratory distress.     Breath sounds: Normal breath sounds.     Comments: Breathing comfortably at  rest, CTABL, no wheezing, rales or other adventitious sounds auscultated; frequent dry coughing in room, wheezing with bronchospasm Abdominal:     Palpations: Abdomen is soft.     Tenderness: There is no abdominal tenderness.  Skin:    General: Skin is warm and dry.  Neurological:     Mental Status: She is alert.      UC Treatments / Results  Labs (all labs ordered are listed, but only abnormal results are displayed) Labs Reviewed - No data to display  EKG None  Radiology No results found.  Procedures Procedures (including critical care time)  Medications Ordered in UC Medications - No data to display  Initial Impression / Assessment and Plan / UC Course  I have reviewed the triage vital signs and the nursing notes.  Pertinent labs & imaging results that were available during my care of the patient were reviewed by me and considered in my medical decision making (see chart for details).    Vital signs stable, exam nonfocal We will treat patient for bronchitis, will initiate on 5 days of prednisone, will refill albuterol inhaler.  Hycodan for nighttime cough, Tessalon for cough during the day.  Continue Claritin and Flonase.  Doxycycline to be filled on Monday if not having any improvement over the next 2 to 3 days with initiation of steroids and inhaler.  Continue to monitor,Discussed strict return precautions. Patient verbalized understanding and is agreeable with plan.  Final Clinical Impressions(s) / UC Diagnoses   Final diagnoses:  Acute bronchitis, unspecified organism     Discharge Instructions     Please begin prednisone daily for the next  5 days, please take with food on your stomach and in the morning I have refilled albuterol inhaler to use as needed for shortness of breath and wheezing You may use Hycodan cough syrup at bedtime to help with nighttime cough, this causes drowsiness, do not drive or work after taking You may use Tessalon every 8 hours during  the day for cough Continue your Claritin and Flonase May fill prescription for doxycycline on Monday if you are not having any improvement with continued symptomatic management and addition of prednisone over the next 2 to 3 days  Please follow-up if developing fever, shortness of breath, chest discomfort, difficulty breathing, persistent symptoms   ED Prescriptions    Medication Sig Dispense Auth. Provider   doxycycline (VIBRAMYCIN) 100 MG capsule Take 1 capsule (100 mg total) by mouth 2 (two) times daily for 10 days. 20 capsule Cambell Rickenbach C, PA-C   predniSONE (DELTASONE) 50 MG tablet Take 1 tablet (50 mg total) by mouth daily with breakfast for 5 days. 5 tablet Eliette Drumwright C, PA-C   albuterol (PROVENTIL HFA;VENTOLIN HFA) 108 (90 Base) MCG/ACT inhaler Inhale 1-2 puffs into the lungs every 6 (six) hours as needed for wheezing or shortness of breath. 1 Inhaler Chalyn Amescua C, PA-C   HYDROcodone-homatropine (HYCODAN) 5-1.5 MG/5ML syrup Take 5 mLs by mouth at bedtime as needed for cough. 70 mL Tomeko Scoville C, PA-C   benzonatate (TESSALON) 200 MG capsule Take 1 capsule (200 mg total) by mouth 3 (three) times daily as needed for up to 7 days for cough. 28 capsule Keandrea Tapley C, PA-C     Controlled Substance Prescriptions [place] Controlled Substance Registry consulted? Not Applicable   Janith Lima, [place] 01/16/18 1559

## 2018-01-16 NOTE — Discharge Instructions (Signed)
Please begin prednisone daily for the next 5 days, please take with food on your stomach and in the morning I have refilled albuterol inhaler to use as needed for shortness of breath and wheezing You may use Hycodan cough syrup at bedtime to help with nighttime cough, this causes drowsiness, do not drive or work after taking You may use Tessalon every 8 hours during the day for cough Continue your Claritin and Flonase May fill prescription for doxycycline on Monday if you are not having any improvement with continued symptomatic management and addition of prednisone over the next 2 to 3 days  Please follow-up if developing fever, shortness of breath, chest discomfort, difficulty breathing, persistent symptoms

## 2018-01-16 NOTE — ED Notes (Signed)
Patient able to ambulate independently  

## 2018-01-19 NOTE — Telephone Encounter (Signed)
Please advise 

## 2018-01-19 NOTE — Telephone Encounter (Signed)
Ok with me 

## 2018-01-19 NOTE — Telephone Encounter (Signed)
I called and spoke to patient, patient would like to [facility] to Dr. Zigmund Daniel. Please advise

## 2018-01-28 NOTE — Telephone Encounter (Signed)
I called and spoke to patient, patient scheduled to see Dr. Zigmund Daniel on 02/02/2018 @ 1:30pm.

## 2018-02-02 ENCOUNTER — Ambulatory Visit (INDEPENDENT_AMBULATORY_CARE_PROVIDER_SITE_OTHER): Payer: PPO | Admitting: Family Medicine

## 2018-02-02 ENCOUNTER — Encounter: Payer: Self-pay | Admitting: Family Medicine

## 2018-02-02 VITALS — BP 126/78 | HR 86 | Temp 98.6°F | Ht 64.0 in | Wt 176.0 lb

## 2018-02-02 DIAGNOSIS — G8929 Other chronic pain: Secondary | ICD-10-CM

## 2018-02-02 DIAGNOSIS — N200 Calculus of kidney: Secondary | ICD-10-CM

## 2018-02-02 DIAGNOSIS — I480 Paroxysmal atrial fibrillation: Secondary | ICD-10-CM

## 2018-02-02 DIAGNOSIS — I10 Essential (primary) hypertension: Secondary | ICD-10-CM

## 2018-02-02 DIAGNOSIS — M25512 Pain in left shoulder: Secondary | ICD-10-CM

## 2018-02-02 DIAGNOSIS — D649 Anemia, unspecified: Secondary | ICD-10-CM | POA: Diagnosis not present

## 2018-02-02 LAB — CBC
HCT: 36.2 % (ref 36.0–46.0)
Hemoglobin: 12.7 g/dL (ref 12.0–15.0)
MCHC: 35.1 g/dL (ref 30.0–36.0)
MCV: 88.2 fl (ref 78.0–100.0)
Platelets: 190 10*3/uL (ref 150.0–400.0)
RBC: 4.1 Mil/uL (ref 3.87–5.11)
RDW: 13.7 % (ref 11.5–15.5)
WBC: 7 10*3/uL (ref 4.0–10.5)

## 2018-02-02 NOTE — Progress Notes (Signed)
Anna Marquez - 68 y.o. female MRN [id number]  Date of birth: April 02, 1950  Subjective Chief Complaint  Patient presents with  . Other    Wants to discuss getting labs check for anemia and kidney stone. Needs new Urologist.     HPI  Anna Marquez is a 68 y.o. female with history of PAF, htn, and recurrent kidney stones hre today to establish with new pcp.  She is followed by Dr. Acie Fredrickson for her PAF and had been seeing Dr. Risa Grill for recurrent kidney stones but needs to establish with a new provider since his retirement.  She reports that overall she is doing well however her cardiologist mentioned that she had some anemia that needed to be evaluated.  She denies any symptoms related to anemia including fatigue, shortness of breath, or dizziness and hgb was 11.2 most recently. She is taking eliquis chronically with history of PAF.  She has not noticed blood in her stool or dark stools.     She is also recovering from a recent episode of bronchitis which seems to be clearing up.    She also has some L shoulder pain.  Has trouble raising arm past about 20-30 degrees.  This has been going on for a few months.  She completed a course of PT which helped some but never regained FROM.    ROS:  A comprehensive ROS was completed and negative except as noted per HPI  Allergies  Allergen Reactions  . Penicillins Hives and Shortness Of Breath    Has patient had a PCN reaction causing immediate rash, facial/tongue/throat swelling, SOB or lightheadedness with hypotension: yes Has patient had a PCN reaction causing severe rash involving mucus membranes or skin necrosis: no Has patient had a PCN reaction that required hospitalization: unknown Has patient had a PCN reaction occurring within the last 10 years: no If all of the above answers are "NO", then may proceed with Cephalosporin use.   . Ibuprofen Other (See Comments)    Pt states allergy to NSAIDS is secondary to ulcers NOT anaphylaxis. Tolerates  daily 325 ASA at home  . Erythromycin Nausea And Vomiting    REACTION: Stomach upset    Past Medical History:  Diagnosis Date  . Adenomatous colon polyp 2003  . Cholelithiasis   . Diverticulosis of colon   . Gastric polyps   . GERD (gastroesophageal reflux disease)   . Helicobacter pylori (H. pylori)   . Hiatal hernia   . Hypertension   . Mitral valve prolapse   . Nephrolithiasis   . Osteoarthritis   . Osteopenia 03/2017   T score -1.5 FRAX 8.9% / 0.9%.  Stable from prior DEXA 2016  . TGA (transient global amnesia)     Past Surgical History:  Procedure Laterality Date  . ANTERIOR AND POSTERIOR REPAIR N/A 06/14/2014   Procedure: ANTERIOR (CYSTOCELE) AND POSTERIOR REPAIR (RECTOCELE);  Surgeon: Anastasio Auerbach, MD;  Location: [facility];  Service: Gynecology;  Laterality: N/A;  . CHOLECYSTECTOMY    . kidney stone removal     x2  . VAGINAL HYSTERECTOMY N/A 06/14/2014   Procedure: HYSTERECTOMY VAGINAL;  Surgeon: Anastasio Auerbach, MD;  Location: [facility];  Service: Gynecology;  Laterality: N/A;    Social History   Socioeconomic History  . Marital status: Divorced    Spouse name: Not on file  . Number of children: Not on file  . Years of education: Not on file  . Highest education level: Not on file  Occupational  History  . Not on file  Social Needs  . Financial resource strain: Not on file  . Food insecurity:    Worry: Not on file    Inability: Not on file  . Transportation needs:    Medical: Not on file    Non-medical: Not on file  Tobacco Use  . Smoking status: Never Smoker  . Smokeless tobacco: Never Used  Substance and Sexual Activity  . Alcohol use: Yes    Alcohol/week: 0.0 standard drinks    Comment: Occas  . Drug use: No  . Sexual activity: Yes    Birth control/protection: Post-menopausal    Comment: 1st intercourse 68 yo-Fewer than 5 partners  Lifestyle  . Physical activity:    Days per week: Not on file    Minutes per session: Not on file  . Stress:  Not on file  Relationships  . Social connections:    Talks on phone: Not on file    Gets together: Not on file    Attends religious service: Not on file    Active member of club or organization: Not on file    Attends meetings of clubs or organizations: Not on file    Relationship status: Not on file  Other Topics Concern  . Not on file  Social History Narrative  . Not on file    Family History  Problem Relation Age of Onset  . Cancer Father        prostate  . Breast cancer Paternal Aunt 64  . Diabetes Maternal Grandmother   . Heart disease Maternal Grandmother   . Heart disease Maternal Grandfather   . Melanoma Paternal Aunt   . Neuropathy Mother   . Congestive Heart Failure Mother     Health Maintenance  Topic Date Due  . Hepatitis C Screening  17-May-1950  . MAMMOGRAM  01/31/2019  . COLONOSCOPY  03/13/2023  . TETANUS/TDAP  10/29/2027  . INFLUENZA VACCINE  Completed  . DEXA SCAN  Completed  . PNA vac Low Risk Adult  Completed    ----------------------------------------------------------------------------------------------------------------------------------------------------------------------------------------------------------------- Physical Exam BP 126/78   Pulse 86   Temp 98.6 F (37 C) (Oral)   Ht 5\' 4"  (1.626 m)   Wt 176 lb (79.8 kg)   SpO2 97%   BMI 30.21 kg/m   Physical Exam Constitutional:      Appearance: Normal appearance.  HENT:     Head: Normocephalic and atraumatic.     Mouth/Throat:     Mouth: Mucous membranes are moist.  Eyes:     General: No scleral icterus. Neck:     Musculoskeletal: Neck supple.  Cardiovascular:     Rate and Rhythm: Normal rate and regular rhythm.  Pulmonary:     Effort: Pulmonary effort is normal.     Breath sounds: Normal breath sounds. No stridor.  Musculoskeletal:     Comments: L shoulder w/ limited ROM in abduction and flexion.  + impingment testing with + empty can with pain and weakness.  Mild pain with  testing of additional rotator cuff muscles.   Skin:    General: Skin is warm and dry.     Capillary Refill: Capillary refill takes less than 2 seconds.     Findings: No rash.  Neurological:     General: No focal deficit present.     Mental Status: She is alert.  Psychiatric:        Mood and Affect: Mood normal.     ------------------------------------------------------------------------------------------------------------------------------------------------------------------------------------------------------------------- Assessment and Plan  Anemia -Very  mild, repeat CBC and check iron studies today.   PAF (paroxysmal atrial fibrillation) ([place]) -Follows with cardiology, stable at this time.  She is anticoagulated with eliquis.   Essential hypertension -Stable, recommend continuation of current medications.   Recurrent nephrolithiasis -Referral back to urology.   Left shoulder pain -Likely related to rotator cuff, ?tear given weakness and pain with trying to fully abduct arm.  -She will see Dr. Raeford Razor.

## 2018-02-02 NOTE — Patient Instructions (Addendum)
-  It was very nice to meet you today! -Please schedule an appointment with Dr. Raeford Razor for your shoulder -I have placed a referral for a new urologist.  -I will see you back annually, we'll plan for follow up in October to keep you on track with your previous annual check up's

## 2018-02-03 LAB — IRON,TIBC AND FERRITIN PANEL
%SAT: 22 % (calc) (ref 16–45)
Ferritin: 58 ng/mL (ref 16–288)
IRON: 78 ug/dL (ref 45–160)
TIBC: 349 mcg/dL (calc) (ref 250–450)

## 2018-02-04 ENCOUNTER — Ambulatory Visit (INDEPENDENT_AMBULATORY_CARE_PROVIDER_SITE_OTHER): Payer: PPO

## 2018-02-04 ENCOUNTER — Ambulatory Visit (INDEPENDENT_AMBULATORY_CARE_PROVIDER_SITE_OTHER): Payer: PPO | Admitting: Family Medicine

## 2018-02-04 ENCOUNTER — Encounter: Payer: Self-pay | Admitting: Family Medicine

## 2018-02-04 VITALS — BP 110/76 | HR 85 | Temp 98.3°F | Ht 64.0 in | Wt 175.0 lb

## 2018-02-04 DIAGNOSIS — M25512 Pain in left shoulder: Secondary | ICD-10-CM | POA: Diagnosis not present

## 2018-02-04 DIAGNOSIS — D649 Anemia, unspecified: Secondary | ICD-10-CM | POA: Insufficient documentation

## 2018-02-04 DIAGNOSIS — N200 Calculus of kidney: Secondary | ICD-10-CM | POA: Insufficient documentation

## 2018-02-04 DIAGNOSIS — M19012 Primary osteoarthritis, left shoulder: Secondary | ICD-10-CM | POA: Diagnosis not present

## 2018-02-04 DIAGNOSIS — M7502 Adhesive capsulitis of left shoulder: Secondary | ICD-10-CM

## 2018-02-04 DIAGNOSIS — M7500 Adhesive capsulitis of unspecified shoulder: Secondary | ICD-10-CM | POA: Insufficient documentation

## 2018-02-04 NOTE — Assessment & Plan Note (Signed)
-  Likely related to rotator cuff, ?tear given weakness and pain with trying to fully abduct arm.  -She will see Dr. Raeford Razor.

## 2018-02-04 NOTE — Assessment & Plan Note (Signed)
-  Referral back to urology.

## 2018-02-04 NOTE — Progress Notes (Signed)
Hgb is normal without signs of anemia at this time.   Iron levels are also normal.  We'll continue to follow her Hgb levels at routine appts.

## 2018-02-04 NOTE — Assessment & Plan Note (Signed)
-  Very mild, repeat CBC and check iron studies today.

## 2018-02-04 NOTE — Progress Notes (Signed)
[name] - 68 y.o. female MRN [id number]  Date of birth: 1950-02-22  SUBJECTIVE:  Including CC & ROS.  Chief Complaint  Patient presents with  . Shoulder Pain    pt here for left shoulder pain     Anna Marquez is a 68 y.o. female that is presenting with left shoulder pain.  This pain is acute on chronic.  She has been dealing with it for over a year.  She initially felt the pain after trying to move her mother.  She has limited range of motion.  She is gone through physical therapy has had some improvement of the range of motion.  Has not been taking anything for the pain.  No prior surgeries or injections.  Pain is worse with certain movements such as in abduction.  No radicular symptoms.  No numbness or tingling..  Review of the EMG from 08/14/2017 shows right median neuropathy consistent with right carpal tunnel.  Independent review of the cervical spine x-ray from 03/25/2017 throws degenerative changes at C5-6 and C6-7.  Review of Systems  Constitutional: Negative for fever.  HENT: Negative for congestion.   Respiratory: Negative for cough.   Cardiovascular: Negative for chest pain.  Gastrointestinal: Negative for abdominal pain.  Musculoskeletal: Positive for arthralgias.  Skin: Negative for color change.  Neurological: Negative for weakness.  Hematological: Negative for adenopathy.  Psychiatric/Behavioral: Negative for agitation.    HISTORY: Past Medical, Surgical, Social, and Family History Reviewed & Updated per EMR.   Pertinent Historical Findings include:  Past Medical History:  Diagnosis Date  . Adenomatous colon polyp 2003  . Cholelithiasis   . Diverticulosis of colon   . Gastric polyps   . GERD (gastroesophageal reflux disease)   . Helicobacter pylori (H. pylori)   . Hiatal hernia   . Hypertension   . Mitral valve prolapse   . Nephrolithiasis   . Osteoarthritis   . Osteopenia 03/2017   T score -1.5 FRAX 8.9% / 0.9%.  Stable from prior DEXA 2016  . TGA  (transient global amnesia)     Past Surgical History:  Procedure Laterality Date  . ANTERIOR AND POSTERIOR REPAIR N/A 06/14/2014   Procedure: ANTERIOR (CYSTOCELE) AND POSTERIOR REPAIR (RECTOCELE);  Surgeon: Anastasio Auerbach, MD;  Location: [facility];  Service: Gynecology;  Laterality: N/A;  . CHOLECYSTECTOMY    . kidney stone removal     x2  . VAGINAL HYSTERECTOMY N/A 06/14/2014   Procedure: HYSTERECTOMY VAGINAL;  Surgeon: Anastasio Auerbach, MD;  Location: [facility];  Service: Gynecology;  Laterality: N/A;    Allergies  Allergen Reactions  . Penicillins Hives and Shortness Of Breath    Has patient had a PCN reaction causing immediate rash, facial/tongue/throat swelling, SOB or lightheadedness with hypotension: yes Has patient had a PCN reaction causing severe rash involving mucus membranes or skin necrosis: no Has patient had a PCN reaction that required hospitalization: unknown Has patient had a PCN reaction occurring within the last 10 years: no If all of the above answers are "NO", then may proceed with Cephalosporin use.   . Ibuprofen Other (See Comments)    Pt states allergy to NSAIDS is secondary to ulcers NOT anaphylaxis. Tolerates daily 325 ASA at home  . Erythromycin Nausea And Vomiting    REACTION: Stomach upset    Family History  Problem Relation Age of Onset  . Cancer Father        prostate  . Breast cancer Paternal Aunt 34  . Diabetes Maternal  Grandmother   . Heart disease Maternal Grandmother   . Heart disease Maternal Grandfather   . Melanoma Paternal Aunt   . Neuropathy Mother   . Congestive Heart Failure Mother      Social History   Socioeconomic History  . Marital status: Divorced    Spouse name: Not on file  . Number of children: Not on file  . Years of education: Not on file  . Highest education level: Not on file  Occupational History  . Not on file  Social Needs  . Financial resource strain: Not on file  . Food insecurity:    Worry: Not on file      Inability: Not on file  . Transportation needs:    Medical: Not on file    Non-medical: Not on file  Tobacco Use  . Smoking status: Never Smoker  . Smokeless tobacco: Never Used  Substance and Sexual Activity  . Alcohol use: Yes    Alcohol/week: 0.0 standard drinks    Comment: Occas  . Drug use: No  . Sexual activity: Yes    Birth control/protection: Post-menopausal    Comment: 1st intercourse 68 yo-Fewer than 5 partners  Lifestyle  . Physical activity:    Days per week: Not on file    Minutes per session: Not on file  . Stress: Not on file  Relationships  . Social connections:    Talks on phone: Not on file    Gets together: Not on file    Attends religious service: Not on file    Active member of club or organization: Not on file    Attends meetings of clubs or organizations: Not on file    Relationship status: Not on file  . Intimate partner violence:    Fear of current or ex partner: Not on file    Emotionally abused: Not on file    Physically abused: Not on file    Forced sexual activity: Not on file  Other Topics Concern  . Not on file  Social History Narrative  . Not on file     PHYSICAL EXAM:  VS: BP 110/76   Pulse 85   Temp 98.3 F (36.8 C) (Oral)   Ht 5\' 4"  (1.626 m)   Wt 175 lb (79.4 kg)   SpO2 97%   BMI 30.04 kg/m  Physical Exam Gen: NAD, alert, cooperative with exam, well-appearing ENT: normal lips, normal nasal mucosa,  Eye: normal EOM, normal conjunctiva and lids CV:  no edema, +2 pedal pulses   Resp: no accessory muscle use, non-labored,  Skin: no rashes, no areas of induration  Neuro: normal tone, normal sensation to touch Psych:  normal insight, alert and oriented MSK:  Left shoulder: Limited active flexion and abduction to around 90 degrees. Limited external rotation throughout 5 degrees. Normal strength resistance with internal and external rotation. Normal empty can testing. Some pain with Hawkins testing. Neurovascularly  intact   Aspiration/Injection Procedure Note Anna Marquez 1950/10/13  Procedure: Injection Indications: Left shoulder pain  Procedure Details Consent: Risks of procedure as well as the alternatives and risks of each were explained to the (patient/caregiver).  Consent for procedure obtained. Time Out: Verified patient identification, verified procedure, site/side was marked, verified correct patient position, special equipment/implants available, medications/allergies/relevent history reviewed, required imaging and test results available.  Performed.  The area was cleaned with iodine and alcohol swabs.    The left glenohumeral joint was injected using 1 cc's of 40 mg Kenalog, 2 cc  of 1% lidocaine and 5 cc's of 0.25% bupivacaine with a 22 3 1/2" needle.  Ultrasound was used. Images were obtained in trans-views showing the injection.     A sterile dressing was applied.  Patient did tolerate procedure well.     ASSESSMENT & PLAN:   Adhesive capsulitis Has significant limited external rotation.  Likely ongoing frozen shoulder. -Glenohumeral injection today. -Counseled on home exercise therapy and supportive care. -X-ray. -If no improvement can consider repeat injection or further physical therapy.

## 2018-02-04 NOTE — Assessment & Plan Note (Signed)
-  Stable, recommend continuation of current medications.

## 2018-02-04 NOTE — Assessment & Plan Note (Signed)
-  Follows with cardiology, stable at this time.  She is anticoagulated with eliquis.

## 2018-02-04 NOTE — Patient Instructions (Signed)
Nice to meet you  Please try the exercises  Please use heat and/or ice on the area  Please try to move the area as much as possible  Please see me back in 3-4 weeks.

## 2018-02-04 NOTE — Assessment & Plan Note (Signed)
Has significant limited external rotation.  Likely ongoing frozen shoulder. -Glenohumeral injection today. -Counseled on home exercise therapy and supportive care. -X-ray. -If no improvement can consider repeat injection or further physical therapy.

## 2018-02-05 ENCOUNTER — Telehealth: Payer: Self-pay | Admitting: Family Medicine

## 2018-02-05 NOTE — Telephone Encounter (Signed)
Spoke with patient about results   Anna Ax, MD [facility] 02/05/2018, 4:34 PM

## 2018-02-26 ENCOUNTER — Other Ambulatory Visit (HOSPITAL_BASED_OUTPATIENT_CLINIC_OR_DEPARTMENT_OTHER): Payer: Self-pay | Admitting: Family Medicine

## 2018-02-26 DIAGNOSIS — Z1231 Encounter for screening mammogram for malignant neoplasm of breast: Secondary | ICD-10-CM

## 2018-03-03 ENCOUNTER — Encounter (HOSPITAL_BASED_OUTPATIENT_CLINIC_OR_DEPARTMENT_OTHER): Payer: Self-pay

## 2018-03-03 ENCOUNTER — Ambulatory Visit (HOSPITAL_BASED_OUTPATIENT_CLINIC_OR_DEPARTMENT_OTHER)
Admission: RE | Admit: 2018-03-03 | Discharge: 2018-03-03 | Disposition: A | Discharge status: Home / Self Care | Payer: PPO | Source: Ambulatory Visit | Attending: Family Medicine | Admitting: Family Medicine

## 2018-03-03 DIAGNOSIS — Z1231 Encounter for screening mammogram for malignant neoplasm of breast: Secondary | ICD-10-CM | POA: Insufficient documentation

## 2018-03-04 ENCOUNTER — Encounter: Payer: Self-pay | Admitting: Family Medicine

## 2018-03-04 ENCOUNTER — Ambulatory Visit (INDEPENDENT_AMBULATORY_CARE_PROVIDER_SITE_OTHER): Payer: PPO | Admitting: Family Medicine

## 2018-03-04 DIAGNOSIS — M7502 Adhesive capsulitis of left shoulder: Secondary | ICD-10-CM

## 2018-03-04 NOTE — Patient Instructions (Signed)
Good to see you  Please try to put warmth on the shoulder before stretching and then ice afterwards  Please see me back in 4 weeks.

## 2018-03-04 NOTE — Progress Notes (Signed)
Anna Marquez - 68 y.o. female MRN [id number]  Date of birth: [birth year]  SUBJECTIVE:  Including CC & ROS.  Chief Complaint  Patient presents with  . Follow-up    left shoulder f/u/ pt states it feels much better after 1st injection/ can do a lot more but still limited    Anna Marquez is a 68 y.o. female that is following up for her adhesive capsulitis.  Has had significant improvement of her pain and range of motion.  Still having some limitations with range of motion.  No radicular symptoms.  Has been doing the home exercises..  Independent review of the left shoulder x-ray from 1/21 shows mild AC joint degenerative changes  Review of Systems  Constitutional: Negative for fever.  HENT: Negative for congestion.   Respiratory: Negative for cough.   Cardiovascular: Negative for chest pain.  Gastrointestinal: Negative for abdominal pain.  Musculoskeletal: Positive for arthralgias.  Skin: Negative for color change.    HISTORY: Past Medical, Surgical, Social, and Family History Reviewed & Updated per EMR.   Pertinent Historical Findings include:  Past Medical History:  Diagnosis Date  . Adenomatous colon polyp 2003  . Cholelithiasis   . Diverticulosis of colon   . Gastric polyps   . GERD (gastroesophageal reflux disease)   . Helicobacter pylori (H. pylori)   . Hiatal hernia   . Hypertension   . Mitral valve prolapse   . Nephrolithiasis   . Osteoarthritis   . Osteopenia 03/2017   T score -1.5 FRAX 8.9% / 0.9%.  Stable from prior DEXA 2016  . TGA (transient global amnesia)     Past Surgical History:  Procedure Laterality Date  . ANTERIOR AND POSTERIOR REPAIR N/A 06/14/2014   Procedure: ANTERIOR (CYSTOCELE) AND POSTERIOR REPAIR (RECTOCELE);  Surgeon: Anastasio Auerbach, MD;  Location: [facility];  Service: Gynecology;  Laterality: N/A;  . CHOLECYSTECTOMY    . kidney stone removal     x2  . VAGINAL HYSTERECTOMY N/A 06/14/2014   Procedure: HYSTERECTOMY VAGINAL;  Surgeon: Anastasio Auerbach, MD;  Location: [facility];  Service: Gynecology;  Laterality: N/A;    Allergies  Allergen Reactions  . Penicillins Hives and Shortness Of Breath    Has patient had a PCN reaction causing immediate rash, facial/tongue/throat swelling, SOB or lightheadedness with hypotension: yes Has patient had a PCN reaction causing severe rash involving mucus membranes or skin necrosis: no Has patient had a PCN reaction that required hospitalization: unknown Has patient had a PCN reaction occurring within the last 10 years: no If all of the above answers are "NO", then may proceed with Cephalosporin use.   . Ibuprofen Other (See Comments)    Pt states allergy to NSAIDS is secondary to ulcers NOT anaphylaxis. Tolerates daily 325 ASA at home  . Erythromycin Nausea And Vomiting    REACTION: Stomach upset    Family History  Problem Relation Age of Onset  . Cancer Father        prostate  . Breast cancer Paternal Aunt 82  . Diabetes Maternal Grandmother   . Heart disease Maternal Grandmother   . Heart disease Maternal Grandfather   . Melanoma Paternal Aunt   . Neuropathy Mother   . Congestive Heart Failure Mother      Social History   Socioeconomic History  . Marital status: Divorced    Spouse name: Not on file  . Number of children: Not on file  . Years of education: Not on file  .  Highest education level: Not on file  Occupational History  . Not on file  Social Needs  . Financial resource strain: Not on file  . Food insecurity:    Worry: Not on file    Inability: Not on file  . Transportation needs:    Medical: Not on file    Non-medical: Not on file  Tobacco Use  . Smoking status: Never Smoker  . Smokeless tobacco: Never Used  Substance and Sexual Activity  . Alcohol use: Yes    Alcohol/week: 0.0 standard drinks    Comment: Occas  . Drug use: No  . Sexual activity: Yes    Birth control/protection: Post-menopausal    Comment: 1st intercourse 68 yo-Fewer than 5 partners   Lifestyle  . Physical activity:    Days per week: Not on file    Minutes per session: Not on file  . Stress: Not on file  Relationships  . Social connections:    Talks on phone: Not on file    Gets together: Not on file    Attends religious service: Not on file    Active member of club or organization: Not on file    Attends meetings of clubs or organizations: Not on file    Relationship status: Not on file  . Intimate partner violence:    Fear of current or ex partner: Not on file    Emotionally abused: Not on file    Physically abused: Not on file    Forced sexual activity: Not on file  Other Topics Concern  . Not on file  Social History Narrative  . Not on file     PHYSICAL EXAM:  VS: BP 122/82   Pulse 85   Temp 98.8 F (37.1 C) (Oral)   Ht 5\' 4"  (1.626 m)   Wt 175 lb (79.4 kg)   BMI 30.04 kg/m  Physical Exam Gen: NAD, alert, cooperative with exam, well-appearing ENT: normal lips, normal nasal mucosa,  Eye: normal EOM, normal conjunctiva and lids CV:  no edema, +2 pedal pulses   Resp: no accessory muscle use, non-labored,  Skin: no rashes, no areas of induration  Neuro: normal tone, normal sensation to touch Psych:  normal insight, alert and oriented MSK:  Left shoulder: No tenderness to palpation of the AC joint. Limitations with external rotation as well as abduction. Normal strength resistance with internal and external rotation. Normal empty can testing. Neurovascular intact     ASSESSMENT & PLAN:   Adhesive capsulitis Has had improvement in pain and range of motion.  Still some slight limitations -We will continue the home exercise program.  If she feels like she is unable to do herself or has plateaued then would send a referral to physical therapy. -Could consider  another injection

## 2018-03-04 NOTE — Assessment & Plan Note (Signed)
Has had improvement in pain and range of motion.  Still some slight limitations -We will continue the home exercise program.  If she feels like she is unable to do herself or has plateaued then would send a referral to physical therapy. -Could consider  another injection

## 2018-03-16 ENCOUNTER — Other Ambulatory Visit: Payer: Self-pay

## 2018-03-16 ENCOUNTER — Encounter: Payer: Self-pay | Admitting: Family Medicine

## 2018-03-16 ENCOUNTER — Ambulatory Visit (INDEPENDENT_AMBULATORY_CARE_PROVIDER_SITE_OTHER): Payer: PPO | Admitting: Family Medicine

## 2018-03-16 ENCOUNTER — Encounter (HOSPITAL_COMMUNITY): Payer: Self-pay

## 2018-03-16 ENCOUNTER — Observation Stay (HOSPITAL_COMMUNITY)
Admission: EM | Admit: 2018-03-16 | Discharge: 2018-03-18 | Disposition: A | Discharge status: Home / Self Care | Payer: PPO | Attending: Emergency Medicine | Admitting: Emergency Medicine

## 2018-03-16 ENCOUNTER — Ambulatory Visit: Payer: Self-pay | Admitting: *Deleted

## 2018-03-16 ENCOUNTER — Emergency Department (HOSPITAL_COMMUNITY): Payer: PPO

## 2018-03-16 VITALS — HR 127 | Temp 98.0°F | Ht 64.0 in | Wt 174.6 lb

## 2018-03-16 DIAGNOSIS — I10 Essential (primary) hypertension: Secondary | ICD-10-CM | POA: Diagnosis present

## 2018-03-16 DIAGNOSIS — I480 Paroxysmal atrial fibrillation: Secondary | ICD-10-CM

## 2018-03-16 DIAGNOSIS — Z79899 Other long term (current) drug therapy: Secondary | ICD-10-CM | POA: Insufficient documentation

## 2018-03-16 DIAGNOSIS — Z8673 Personal history of transient ischemic attack (TIA), and cerebral infarction without residual deficits: Secondary | ICD-10-CM | POA: Insufficient documentation

## 2018-03-16 DIAGNOSIS — R0602 Shortness of breath: Secondary | ICD-10-CM | POA: Insufficient documentation

## 2018-03-16 DIAGNOSIS — I4891 Unspecified atrial fibrillation: Secondary | ICD-10-CM | POA: Diagnosis not present

## 2018-03-16 DIAGNOSIS — R000 Tachycardia, unspecified: Secondary | ICD-10-CM

## 2018-03-16 DIAGNOSIS — E860 Dehydration: Secondary | ICD-10-CM | POA: Diagnosis not present

## 2018-03-16 DIAGNOSIS — J069 Acute upper respiratory infection, unspecified: Secondary | ICD-10-CM

## 2018-03-16 DIAGNOSIS — R05 Cough: Secondary | ICD-10-CM | POA: Diagnosis not present

## 2018-03-16 DIAGNOSIS — R002 Palpitations: Secondary | ICD-10-CM | POA: Diagnosis present

## 2018-03-16 DIAGNOSIS — R079 Chest pain, unspecified: Secondary | ICD-10-CM | POA: Diagnosis not present

## 2018-03-16 LAB — CBC WITH DIFFERENTIAL/PLATELET
Abs Immature Granulocytes: 0.02 10*3/uL (ref 0.00–0.07)
Basophils Absolute: 0 10*3/uL (ref 0.0–0.1)
Basophils Relative: 1 %
EOS PCT: 3 %
Eosinophils Absolute: 0.2 10*3/uL (ref 0.0–0.5)
HCT: 29.1 % — ABNORMAL LOW (ref 36.0–46.0)
Hemoglobin: 10.1 g/dL — ABNORMAL LOW (ref 12.0–15.0)
Immature Granulocytes: 0 %
Lymphocytes Relative: 30 %
Lymphs Abs: 2 10*3/uL (ref 0.7–4.0)
MCH: 30.6 pg (ref 26.0–34.0)
MCHC: 34.7 g/dL (ref 30.0–36.0)
MCV: 88.2 fL (ref 80.0–100.0)
Monocytes Absolute: 0.5 10*3/uL (ref 0.1–1.0)
Monocytes Relative: 8 %
Neutro Abs: 4 10*3/uL (ref 1.7–7.7)
Neutrophils Relative %: 58 %
Platelets: 168 10*3/uL (ref 150–400)
RBC: 3.3 MIL/uL — ABNORMAL LOW (ref 3.87–5.11)
RDW: 13.3 % (ref 11.5–15.5)
WBC: 6.8 10*3/uL (ref 4.0–10.5)
nRBC: 0 % (ref 0.0–0.2)

## 2018-03-16 LAB — BASIC METABOLIC PANEL
Anion gap: 8 (ref 5–15)
BUN: 13 mg/dL (ref 8–23)
CO2: 20 mmol/L — ABNORMAL LOW (ref 22–32)
Calcium: 7.8 mg/dL — ABNORMAL LOW (ref 8.9–10.3)
Chloride: 111 mmol/L (ref 98–111)
Creatinine, Ser: 1.22 mg/dL — ABNORMAL HIGH (ref 0.44–1.00)
GFR calc Af Amer: 52 mL/min — ABNORMAL LOW (ref >60)
GFR calc non Af Amer: 45 mL/min — ABNORMAL LOW (ref >60)
Glucose, Bld: 91 mg/dL (ref 70–99)
Potassium: 2.6 mmol/L — CL (ref 3.5–5.1)
SODIUM: 139 mmol/L (ref 135–145)

## 2018-03-16 LAB — MAGNESIUM: Magnesium: 1.5 mg/dL — ABNORMAL LOW (ref 1.7–2.4)

## 2018-03-16 LAB — POCT I-STAT EG7
Acid-base deficit: 4 mmol/L — ABNORMAL HIGH (ref 0.0–2.0)
Bicarbonate: 20.1 mmol/L (ref 20.0–28.0)
Calcium, Ion: 1.06 mmol/L — ABNORMAL LOW (ref 1.15–1.40)
HCT: 26 % — ABNORMAL LOW (ref 36.0–46.0)
Hemoglobin: 8.8 g/dL — ABNORMAL LOW (ref 12.0–15.0)
O2 Saturation: 70 %
Potassium: 3.4 mmol/L — ABNORMAL LOW (ref 3.5–5.1)
Sodium: 141 mmol/L (ref 135–145)
TCO2: 21 mmol/L — ABNORMAL LOW (ref 22–32)
pCO2, Ven: 31.7 mmHg — ABNORMAL LOW (ref 44.0–60.0)
pH, Ven: 7.41 (ref 7.250–7.430)
pO2, Ven: 36 mmHg (ref 32.0–45.0)

## 2018-03-16 LAB — CBG MONITORING, ED: Glucose-Capillary: 88 mg/dL (ref 70–99)

## 2018-03-16 LAB — BRAIN NATRIURETIC PEPTIDE: B NATRIURETIC PEPTIDE 5: 171.9 pg/mL — AB (ref 0.0–100.0)

## 2018-03-16 LAB — I-STAT CREATININE, ED: Creatinine, Ser: 1 mg/dL (ref 0.44–1.00)

## 2018-03-16 MED ORDER — POTASSIUM CHLORIDE 10 MEQ/100ML IV SOLN
10.0000 meq | INTRAVENOUS | Status: AC
  Administered 2018-03-16 (×2): 10 meq via INTRAVENOUS
  Filled 2018-03-16 (×2): qty 100

## 2018-03-16 MED ORDER — DILTIAZEM HCL-DEXTROSE 100-5 MG/100ML-% IV SOLN (PREMIX)
5.0000 mg/h | INTRAVENOUS | Status: DC
  Filled 2018-03-16: qty 100

## 2018-03-16 MED ORDER — LACTATED RINGERS IV BOLUS: 1000.0000 mL | Freq: Once | INTRAVENOUS | Status: DC

## 2018-03-16 MED ORDER — FENTANYL CITRATE (PF) 100 MCG/2ML IJ SOLN
INTRAMUSCULAR | Status: AC
  Filled 2018-03-16: qty 2

## 2018-03-16 MED ORDER — DILTIAZEM LOAD VIA INFUSION
15.0000 mg | Freq: Once | INTRAVENOUS | Status: DC
  Filled 2018-03-16: qty 15

## 2018-03-16 MED ORDER — MAGNESIUM SULFATE 2 GM/50ML IV SOLN
2.0000 g | Freq: Once | INTRAVENOUS | Status: AC
  Administered 2018-03-16: 2 g via INTRAVENOUS
  Filled 2018-03-16: qty 50

## 2018-03-16 MED ORDER — POTASSIUM CHLORIDE CRYS ER 20 MEQ PO TBCR
40.0000 meq | EXTENDED_RELEASE_TABLET | Freq: Once | ORAL | Status: AC
  Administered 2018-03-16: 40 meq via ORAL
  Filled 2018-03-16: qty 2

## 2018-03-16 MED ORDER — ETOMIDATE 2 MG/ML IV SOLN
INTRAVENOUS | Status: AC | PRN
  Administered 2018-03-16: 10 mg via INTRAVENOUS

## 2018-03-16 MED ORDER — ETOMIDATE 2 MG/ML IV SOLN
10.0000 mg | Freq: Once | INTRAVENOUS | Status: DC
  Filled 2018-03-16: qty 10

## 2018-03-16 MED ORDER — FENTANYL CITRATE (PF) 100 MCG/2ML IJ SOLN
INTRAMUSCULAR | Status: AC | PRN
  Administered 2018-03-16: 50 ug via INTRAVENOUS

## 2018-03-16 NOTE — ED Triage Notes (Signed)
Pt to PCP this am with SOB and dry cough since Saturday. EKG in office found afib 1200-170s. Pt does have Hx PAF on eliquis. A/O x 4, well appearing and not in distress.  EMS Vitals... afib 120-170s BP 123/73 98% RA Intermittent CP/pressure 4/10 20 rr  EMS gave 1000 cc NS bolus and 15 mg total IV metoprolol en route without improvement to rate/rhythm.

## 2018-03-16 NOTE — Progress Notes (Addendum)
Anna Marquez - 68 y.o. female MRN [id number]  Date of birth: [birth year]  Subjective Chief Complaint  Patient presents with  . Hoarse  . Shortness of Breath    has been doing atleast twice a day   . Irregular Heart Beat    pt has doubled up on her nadolol     HPI Anna Marquez is 68 y.o. female here today with complaint of shortness of breath.  She reports that she began having cold symptoms a few days ago after watching her grandson who was diagnosed with croup.  She has been using her albuterol inhaler and claritin to help with management of symptoms.  Her congestion has improved however she is feeling like her heart is racing, she feels tight in her chest and little short of breath.  She denies dizziness or lightheaded feeling.  She did increase her nadolol to try and better control her heart rate.  She has not had fever.  Cough is non-productive.    ROS:  A comprehensive ROS was completed and negative except as noted per HPI  Allergies  Allergen Reactions  . Penicillins Hives and Shortness Of Breath    Has patient had a PCN reaction causing immediate rash, facial/tongue/throat swelling, SOB or lightheadedness with hypotension: yes Has patient had a PCN reaction causing severe rash involving mucus membranes or skin necrosis: no Has patient had a PCN reaction that required hospitalization: unknown Has patient had a PCN reaction occurring within the last 10 years: no If all of the above answers are "NO", then may proceed with Cephalosporin use.   . Ibuprofen Other (See Comments)    Pt states allergy to NSAIDS is secondary to ulcers NOT anaphylaxis. Tolerates daily 325 ASA at home  . Erythromycin Nausea And Vomiting    REACTION: Stomach upset    Past Medical History:  Diagnosis Date  . Adenomatous colon polyp 2003  . Cholelithiasis   . Diverticulosis of colon   . Gastric polyps   . GERD (gastroesophageal reflux disease)   . Helicobacter pylori (H. pylori)   . Hiatal hernia     . Hypertension   . Mitral valve prolapse   . Nephrolithiasis   . Osteoarthritis   . Osteopenia 03/2017   T score -1.5 FRAX 8.9% / 0.9%.  Stable from prior DEXA 2016  . TGA (transient global amnesia)     Past Surgical History:  Procedure Laterality Date  . ANTERIOR AND POSTERIOR REPAIR N/A 06/14/2014   Procedure: ANTERIOR (CYSTOCELE) AND POSTERIOR REPAIR (RECTOCELE);  Surgeon: Anastasio Auerbach, MD;  Location: [facility];  Service: Gynecology;  Laterality: N/A;  . CHOLECYSTECTOMY    . kidney stone removal     x2  . VAGINAL HYSTERECTOMY N/A 06/14/2014   Procedure: HYSTERECTOMY VAGINAL;  Surgeon: Anastasio Auerbach, MD;  Location: [facility];  Service: Gynecology;  Laterality: N/A;    Social History   Socioeconomic History  . Marital status: Divorced    Spouse name: Not on file  . Number of children: Not on file  . Years of education: Not on file  . Highest education level: Not on file  Occupational History  . Not on file  Social Needs  . Financial resource strain: Not on file  . Food insecurity:    Worry: Not on file    Inability: Not on file  . Transportation needs:    Medical: Not on file    Non-medical: Not on file  Tobacco Use  . Smoking status:  Never Smoker  . Smokeless tobacco: Never Used  Substance and Sexual Activity  . Alcohol use: Yes    Alcohol/week: 0.0 standard drinks    Comment: Occas  . Drug use: No  . Sexual activity: Yes    Birth control/protection: Post-menopausal    Comment: 1st intercourse 68 yo-Fewer than 5 partners  Lifestyle  . Physical activity:    Days per week: Not on file    Minutes per session: Not on file  . Stress: Not on file  Relationships  . Social connections:    Talks on phone: Not on file    Gets together: Not on file    Attends religious service: Not on file    Active member of club or organization: Not on file    Attends meetings of clubs or organizations: Not on file    Relationship status: Not on file  Other Topics Concern  .  Not on file  Social History Narrative  . Not on file    Family History  Problem Relation Age of Onset  . Cancer Father        prostate  . Breast cancer Paternal Aunt 7  . Diabetes Maternal Grandmother   . Heart disease Maternal Grandmother   . Heart disease Maternal Grandfather   . Melanoma Paternal Aunt   . Neuropathy Mother   . Congestive Heart Failure Mother     Health Maintenance  Topic Date Due  . Hepatitis C Screening  [birth year]  . MAMMOGRAM  03/03/2020  . COLONOSCOPY  03/13/2023  . TETANUS/TDAP  10/29/2027  . INFLUENZA VACCINE  Completed  . DEXA SCAN  Completed  . PNA vac Low Risk Adult  Completed    ----------------------------------------------------------------------------------------------------------------------------------------------------------------------------------------------------------------- Physical Exam Pulse (!) 127   Temp 98 F (36.7 C) (Oral)   Ht 5\' 4"  (1.626 m)   Wt 174 lb 9.6 oz (79.2 kg)   SpO2 97%   BMI 29.97 kg/m   Physical Exam Constitutional:      Appearance: She is well-developed.  HENT:     Head: Normocephalic and atraumatic.     Right Ear: Tympanic membrane normal.     Left Ear: Tympanic membrane normal.     Mouth/Throat:     Mouth: Mucous membranes are moist.  Eyes:     General: No scleral icterus. Neck:     Musculoskeletal: Neck supple.  Cardiovascular:     Rate and Rhythm: Tachycardia present. Rhythm irregular.  Pulmonary:     Breath sounds: Normal breath sounds.  Musculoskeletal:     Right lower leg: No edema.     Left lower leg: No edema.  Skin:    General: Skin is warm and dry.     Findings: No rash.  Neurological:     General: No focal deficit present.     Mental Status: She is alert.  Psychiatric:        Mood and Affect: Mood normal.      ------------------------------------------------------------------------------------------------------------------------------------------------------------------------------------------------------------------- Assessment and Plan  Paroxysmal atrial fibrillation (HCC) -EKG showing A. Fib with RVR and some ST depression with non-specific T-wave changes. Likely due to current illness and albuterol use.   -Given EKG findings as well as symptoms of chest tightness and shortness of breath I recommend transportation to ER for further evaluation and titration of medications vs cardioversion to reduce her HR   Viral upper respiratory tract infection -Additional symptoms seem to be related to viral uri.   -Recommend supportive treatment after evaluation in ED or A. Fib  w/ RVR

## 2018-03-16 NOTE — Assessment & Plan Note (Signed)
-  Additional symptoms seem to be related to viral uri.   -Recommend supportive treatment after evaluation in ED or A. Fib w/ RVR

## 2018-03-16 NOTE — ED Provider Notes (Signed)
Emergency Department Provider Note   I have reviewed the triage vital signs and the nursing notes.   HISTORY  Chief Complaint Atrial Fibrillation   HPI Anna Marquez is a 68 y.o. female with multiple medical problems as documented below to include mitral valve prolapse for which she is on nadolol and a history of paroxysmal atrial fibrillation for which is on Eliquis but she is only ever had one episode previously.  She is never been cardioverted.  She states that she had upper respiratory infection symptoms on Thursday which she got from a relative.  She was having dry cough, congestion and other associated symptoms but then Friday she started having palpitations and worsening shortness of breath.  This continued throughout the weekend so she went to see her doctor today who found her to be in atrial fibrillation today called EMS.  EMS gave 3 separate doses of 5 mg of metoprolol and with them and a thousand milliliters of normal saline without change and brought here for further evaluation.  Has no arm swelling or leg swelling.  No history of heart failure.  No chest pain aside from the palpitations.  Shortness of breath is present at rest and on exertion. No other associated or modifying symptoms.    Past Medical History:  Diagnosis Date  . Adenomatous colon polyp 2003  . Cholelithiasis   . Diverticulosis of colon   . Gastric polyps   . GERD (gastroesophageal reflux disease)   . Helicobacter pylori (H. pylori)   . Hiatal hernia   . Hypertension   . Mitral valve prolapse   . Nephrolithiasis   . Osteoarthritis   . Osteopenia 03/2017   T score -1.5 FRAX 8.9% / 0.9%.  Stable from prior DEXA 2016  . TGA (transient global amnesia)     Patient Active Problem List   Diagnosis Date Noted  . Viral upper respiratory tract infection 03/16/2018  . Anemia 02/04/2018  . Recurrent nephrolithiasis 02/04/2018  . Adhesive capsulitis 02/04/2018  . Routine general medical examination at a  health care facility 10/28/2017  . Acute cystitis 10/22/2017  . Palpitations 01/31/2017  . Cough 11/21/2016  . TIA (transient ischemic attack) 03/07/2016  . Hematuria 12/06/2015  . Essential hypertension 01/27/2012  . Paroxysmal atrial fibrillation ([place]) 01/09/2012  . Migraine without aura 05/20/2008  . Allergic rhinitis 05/20/2008  . Mitral regurgitation 08/10/2007  . GERD 08/10/2007  . Diaphragmatic hernia 08/10/2007  . DIVERTICULOSIS, COLON 08/10/2007  . Osteoarthritis 08/10/2007  . COLONIC POLYPS, ADENOMATOUS, HX OF 08/10/2007    Past Surgical History:  Procedure Laterality Date  . ANTERIOR AND POSTERIOR REPAIR N/A 06/14/2014   Procedure: ANTERIOR (CYSTOCELE) AND POSTERIOR REPAIR (RECTOCELE);  Surgeon: Anastasio Auerbach, MD;  Location: [facility];  Service: Gynecology;  Laterality: N/A;  . CHOLECYSTECTOMY    . kidney stone removal     x2  . VAGINAL HYSTERECTOMY N/A 06/14/2014   Procedure: HYSTERECTOMY VAGINAL;  Surgeon: Anastasio Auerbach, MD;  Location: [facility];  Service: Gynecology;  Laterality: N/A;    Current Outpatient Rx  . Order #: [id number] Class: Normal  . Order #: [id number] Class: Normal  . Order #: [id number] Class: Normal  . Order #: [id number] Class: Normal  . Order #: [id number] Class: Normal  . Order #: [id number] Class: Historical Med  . Order #: [id number] Class: Normal  . Order #: [id number] Class: Normal    Allergies Penicillins; Ibuprofen; and Erythromycin  Family History  Problem Relation Age of Onset  . Cancer Father  prostate  . Breast cancer Paternal Aunt 69  . Diabetes Maternal Grandmother   . Heart disease Maternal Grandmother   . Heart disease Maternal Grandfather   . Melanoma Paternal Aunt   . Neuropathy Mother   . Congestive Heart Failure Mother     Social History Social History   Tobacco Use  . Smoking status: Never Smoker  . Smokeless tobacco: Never Used  Substance Use Topics  . Alcohol use: Yes    Alcohol/week: 0.0 standard drinks     Comment: Occas  . Drug use: No    Review of Systems  All other systems negative except as documented in the HPI. All pertinent positives and negatives as reviewed in the HPI. ____________________________________________   PHYSICAL EXAM:  VITAL SIGNS: ED Triage Vitals  Enc Vitals Group     BP --      Pulse --      Resp --      Temp --      Temp src --      SpO2 03/16/18 1629 99 %     Weight 03/16/18 1631 174 lb 2.6 oz (79 kg)     Height 03/16/18 1631 5\' 4"  (1.626 m)     Head Circumference --      Peak Flow --      Pain Score 03/16/18 1630 4     Pain Loc --      Pain Edu? --      Excl. in [place]? --     Constitutional: Alert and oriented. Well appearing and in no acute distress. Eyes: Conjunctivae are normal. PERRL. EOMI. Head: Atraumatic. Nose: No congestion/rhinnorhea. Mouth/Throat: Mucous membranes are moist.  Oropharynx non-erythematous. Neck: No stridor.  No meningeal signs.   Cardiovascular: tachycrdic rate, irregular rhythm. Good peripheral circulation. Grossly normal heart sounds.   Respiratory: Normal respiratory effort.  No retractions. Lungs CTAB. Gastrointestinal: Soft and nontender. No distention.  Musculoskeletal: No lower extremity tenderness nor edema. No gross deformities of extremities. Neurologic:  Normal speech and language. No gross focal neurologic deficits are appreciated.  Skin:  Skin is warm, dry and intact. No rash noted.   ____________________________________________   LABS (all labs ordered are listed, but only abnormal results are displayed)  Labs Reviewed  CBC WITH DIFFERENTIAL/PLATELET  BASIC METABOLIC PANEL  BRAIN NATRIURETIC PEPTIDE  MAGNESIUM   ____________________________________________  EKG   EKG Interpretation  Date/Time:  Monday March 16 2018 16:28:25 EDT Ventricular Rate:  148 PR Interval:    QRS Duration: 94 QT Interval:  294 QTC Calculation: 462 R Axis:   24 Text Interpretation:  Atrial fibrillation Low voltage,  precordial leads Repolarization abnormality, prob rate related Confirmed by Merrily Pew [phone]) on 03/16/2018 4:45:06 PM       ____________________________________________  RADIOLOGY  No results found.  ____________________________________________   PROCEDURES  Procedure(s) performed:   .Sedation Date/Time: 03/17/2018 2:39 PM Performed by: Merrily Pew, MD Authorized by: Merrily Pew, MD   Consent:    Consent obtained:  Verbal   Consent given by:  Patient   Risks discussed:  Allergic reaction, dysrhythmia, inadequate sedation, nausea, prolonged hypoxia resulting in organ damage, prolonged sedation necessitating reversal, respiratory compromise necessitating ventilatory assistance and intubation and vomiting   Alternatives discussed:  Analgesia without sedation, anxiolysis and regional anesthesia Universal protocol:    Procedure explained and questions answered to patient or proxy's satisfaction: yes     Relevant documents present and verified: yes     Test results available and properly labeled: yes  Imaging studies available: yes     Required blood products, implants, devices, and special equipment available: yes     Site/side marked: yes     Immediately prior to procedure a time out was called: yes     Patient identity confirmation method:  Verbally with patient Indications:    Procedure performed:  Cardioversion   Procedure necessitating sedation performed by:  Physician performing sedation Pre-sedation assessment:    Time since last food or drink:  >2 hours   ASA classification: class 1 - normal, healthy patient     Neck mobility: normal     Mouth opening:  3 or more finger widths   Thyromental distance:  4 finger widths   Mallampati score:  I - soft palate, uvula, fauces, pillars visible   Pre-sedation assessments completed and reviewed: airway patency, cardiovascular function, hydration status, mental status, nausea/vomiting, pain level, respiratory function  and temperature   Immediate pre-procedure details:    Reassessment: Patient reassessed immediately prior to procedure     Reviewed: vital signs, relevant labs/tests and NPO status     Verified: bag valve mask available, emergency equipment available, intubation equipment available, IV patency confirmed, oxygen available and suction available   Procedure details (see MAR for exact dosages):    Preoxygenation:  Nasal cannula   Sedation:  Etomidate   Analgesia:  Fentanyl   Intra-procedure monitoring:  Blood pressure monitoring, cardiac monitor, continuous pulse oximetry, frequent LOC assessments, frequent vital sign checks and continuous capnometry   Intra-procedure events: none     Total Provider sedation time (minutes):  21 Post-procedure details:    Attendance: Constant attendance by certified staff until patient recovered     Recovery: Patient returned to pre-procedure baseline     Post-sedation assessments completed and reviewed: airway patency, cardiovascular function, hydration status, mental status, nausea/vomiting, pain level, respiratory function and temperature     Patient is stable for discharge or admission: yes     Patient tolerance:  Tolerated well, no immediate complications .Cardioversion Date/Time: 03/17/2018 2:40 PM Performed by: Merrily Pew, MD Authorized by: Merrily Pew, MD   Consent:    Consent obtained:  Verbal and written   Consent given by:  Patient   Risks discussed:  Cutaneous burn, death, pain and induced arrhythmia   Alternatives discussed:  No treatment, rate-control medication, anti-coagulation medication, referral, delayed treatment and observation Pre-procedure details:    Cardioversion basis:  Elective   Rhythm:  Atrial fibrillation   Electrode placement:  Anterior-posterior Patient sedated: Yes. Refer to sedation procedure documentation for details of sedation.  Attempt one:    Cardioversion mode:  Synchronous   Waveform:  Monophasic   Shock  (Joules):  120   Shock outcome:  No change in rhythm Attempt two:    Cardioversion mode:  Synchronous   Waveform:  Monophasic   Shock (Joules):  150   Shock outcome:  No change in rhythm Post-procedure details:    Patient status:  Awake   Patient tolerance of procedure:  Tolerated well, no immediate complications .Critical Care Performed by: Merrily Pew, MD Authorized by: Merrily Pew, MD   Critical care provider statement:    Critical care time (minutes):  45   Critical care time was exclusive of:  Separately billable procedures and treating other patients   Critical care was necessary to treat or prevent imminent or life-threatening deterioration of the following conditions:  Cardiac failure and circulatory failure   Critical care was time spent personally by me on the following  activities:  Discussions with consultants, evaluation of patient's response to treatment, examination of patient, ordering and performing treatments and interventions, ordering and review of laboratory studies, ordering and review of radiographic studies, pulse oximetry, re-evaluation of patient's condition, obtaining history from patient or surrogate and review of old charts     ____________________________________________   [place] / [place] / ED COURSE  Patient with A. fib RVR that is likely started on Friday.  She is been on Eliquis for multiple months and has not missed any doses that she remembers.  Did not respond to IV beta-blockers prior to arrival and fluids.  Patient would prefer electrocardioversion in the emergency room and discharge if possible.  I will discuss with cardiology, check CBC, BMP and magnesium.  I suspect that patient will likely be sedated and cardioverted and discharged if successful.  Not having chest pain or any other concerns for ACS we will not check troponin as it would likely be elevated in setting of demand ischemia. SOB present at all times, no  extremity swelling, history of DVT and is on eliquis making PE unlikely, will defer testing at this time.   Low K and Mg. D/w Cardiology, Dr. [place], agrees with repletion then attempted cardioversion.  K improved to 3.4. Mg repleted. Still failed cardioversion X 2 while sedated. Had a sinus pause went back into Afib RVR both times. Will start diltiazem drip and admit to SDU with medicine. Already on eliquis, will likely continue the same.  Pertinent labs & imaging results that were available during my care of the patient were reviewed by me and considered in my medical decision making (see chart for details).  ____________________________________________  FINAL CLINICAL IMPRESSION(S) / ED DIAGNOSES  Final diagnoses:  None     MEDICATIONS GIVEN DURING THIS VISIT:  Medications - No data to display   NEW OUTPATIENT MEDICATIONS STARTED DURING THIS VISIT:  New Prescriptions   No medications on file    Note:  This note was prepared with assistance of Dragon voice recognition software. Occasional wrong-word or sound-a-like substitutions may have occurred due to the inherent limitations of voice recognition software.   Merrily Pew, MD 03/17/18 [phone]

## 2018-03-16 NOTE — Assessment & Plan Note (Addendum)
-  EKG showing A. Fib with RVR and some ST depression with non-specific T-wave changes. Likely due to current illness and albuterol use.   -Given EKG findings as well as symptoms of chest tightness and shortness of breath I recommend transportation to ER for further evaluation and titration of medications vs cardioversion to reduce her HR

## 2018-03-16 NOTE — Telephone Encounter (Signed)
Pt reports cough, onset Thursday. States productive for minimal white-clear phlegm. LGT over weekend, max 100.0. This am, 99.2. States cough is severe, SOB with coughing spells. H/O asthma, states inhalers "not helping much", chest "Feels tight." States kept grandson past week who had similar symptoms, as well as "stomach virus". Appt made with Dr. Zigmund Daniel for today. Instructed to go to UC if symptoms worsen; SOB, chest tightness increases in severity. Pt verbalizes understanding.  Reason for Disposition . [1] Known COPD or other severe lung disease (i.e., bronchiectasis, cystic fibrosis, lung surgery) AND [2] worsening symptoms (i.e., increased sputum purulence or amount, increased breathing difficulty  Answer Assessment - Initial Assessment Questions 1. ONSET: "When did the cough begin?"      Thursday 2. SEVERITY: "How bad is the cough today?"      Severe 3. RESPIRATORY DISTRESS: "Describe your breathing."      Chest tight, SOB with coughing 4. FEVER: "Do you have a fever?" If so, ask: "What is your temperature, how was it measured, and when did it start?"     LGT over the weekend; this AM 99.2 5. SPUTUM: "Describe the color of your sputum" (clear, white, yellow, green)     white 6. HEMOPTYSIS: "Are you coughing up any blood?" If so ask: "How much?" (flecks, streaks, tablespoons, etc.)     no 7. CARDIAC HISTORY: "Do you have any history of heart disease?" (e.g., heart attack, congestive heart failure)      HTN 8. LUNG HISTORY: "Do you have any history of lung disease?"  (e.g., pulmonary embolus, asthma, emphysema)     Asthma 9. PE RISK FACTORS: "Do you have a history of blood clots?" (or: recent major surgery, recent prolonged travel, bedridden)     no 10. OTHER SYMPTOMS: "Do you have any other symptoms?" (e.g., runny nose, wheezing, chest pain)       Chest tightness  12. TRAVEL: "Have you traveled out of the country in the last month?" (e.g., travel history, exposures)       No  Protocols used: [place]

## 2018-03-17 DIAGNOSIS — I4891 Unspecified atrial fibrillation: Secondary | ICD-10-CM

## 2018-03-17 DIAGNOSIS — I10 Essential (primary) hypertension: Secondary | ICD-10-CM | POA: Diagnosis not present

## 2018-03-17 LAB — IRON AND TIBC
Iron: 75 ug/dL (ref 28–170)
Saturation Ratios: 23 % (ref 10.4–31.8)
TIBC: 328 ug/dL (ref 250–450)
UIBC: 253 ug/dL

## 2018-03-17 LAB — CBC
HCT: 33.8 % — ABNORMAL LOW (ref 36.0–46.0)
Hemoglobin: 11.4 g/dL — ABNORMAL LOW (ref 12.0–15.0)
MCH: 29.8 pg (ref 26.0–34.0)
MCHC: 33.7 g/dL (ref 30.0–36.0)
MCV: 88.3 fL (ref 80.0–100.0)
PLATELETS: 201 10*3/uL (ref 150–400)
RBC: 3.83 MIL/uL — ABNORMAL LOW (ref 3.87–5.11)
RDW: 13.7 % (ref 11.5–15.5)
WBC: 8.7 10*3/uL (ref 4.0–10.5)
nRBC: 0 % (ref 0.0–0.2)

## 2018-03-17 LAB — HEPATIC FUNCTION PANEL
ALT: 14 U/L (ref 0–44)
AST: 15 U/L (ref 15–41)
Albumin: 3.2 g/dL — ABNORMAL LOW (ref 3.5–5.0)
Alkaline Phosphatase: 33 U/L — ABNORMAL LOW (ref 38–126)
Bilirubin, Direct: 0.2 mg/dL (ref 0.0–0.2)
Indirect Bilirubin: 1.3 mg/dL — ABNORMAL HIGH (ref 0.3–0.9)
Total Bilirubin: 1.5 mg/dL — ABNORMAL HIGH (ref 0.3–1.2)
Total Protein: 6 g/dL — ABNORMAL LOW (ref 6.5–8.1)

## 2018-03-17 LAB — TROPONIN I
Troponin I: <0.03 ng/mL (ref <0.03)
Troponin I: <0.03 ng/mL (ref <0.03)
Troponin I: <0.03 ng/mL (ref <0.03)

## 2018-03-17 LAB — CBC WITH DIFFERENTIAL/PLATELET
Abs Immature Granulocytes: 0.01 10*3/uL (ref 0.00–0.07)
BASOS ABS: 0 10*3/uL (ref 0.0–0.1)
Basophils Relative: 1 %
EOS PCT: 2 %
Eosinophils Absolute: 0.2 10*3/uL (ref 0.0–0.5)
HCT: 32.1 % — ABNORMAL LOW (ref 36.0–46.0)
Hemoglobin: 10.6 g/dL — ABNORMAL LOW (ref 12.0–15.0)
Immature Granulocytes: 0 %
Lymphocytes Relative: 36 %
Lymphs Abs: 2.4 10*3/uL (ref 0.7–4.0)
MCH: 29 pg (ref 26.0–34.0)
MCHC: 33 g/dL (ref 30.0–36.0)
MCV: 87.7 fL (ref 80.0–100.0)
Monocytes Absolute: 0.5 10*3/uL (ref 0.1–1.0)
Monocytes Relative: 8 %
NRBC: 0 % (ref 0.0–0.2)
Neutro Abs: 3.6 10*3/uL (ref 1.7–7.7)
Neutrophils Relative %: 53 %
Platelets: 172 10*3/uL (ref 150–400)
RBC: 3.66 MIL/uL — ABNORMAL LOW (ref 3.87–5.11)
RDW: 13.5 % (ref 11.5–15.5)
WBC: 6.8 10*3/uL (ref 4.0–10.5)

## 2018-03-17 LAB — BASIC METABOLIC PANEL
Anion gap: 10 (ref 5–15)
BUN: 13 mg/dL (ref 8–23)
CO2: 20 mmol/L — ABNORMAL LOW (ref 22–32)
Calcium: 8.6 mg/dL — ABNORMAL LOW (ref 8.9–10.3)
Chloride: 108 mmol/L (ref 98–111)
Creatinine, Ser: 1.2 mg/dL — ABNORMAL HIGH (ref 0.44–1.00)
GFR calc Af Amer: 53 mL/min — ABNORMAL LOW (ref >60)
GFR, EST NON AFRICAN AMERICAN: 46 mL/min — AB (ref >60)
Glucose, Bld: 93 mg/dL (ref 70–99)
POTASSIUM: 3.3 mmol/L — AB (ref 3.5–5.1)
Sodium: 138 mmol/L (ref 135–145)

## 2018-03-17 LAB — TYPE AND SCREEN
ABO/RH(D): O POS
Antibody Screen: NEGATIVE

## 2018-03-17 LAB — VITAMIN B12: Vitamin B-12: 292 pg/mL (ref 180–914)

## 2018-03-17 LAB — RETICULOCYTES
Immature Retic Fract: 12.5 % (ref 2.3–15.9)
RBC.: 3.83 MIL/uL — ABNORMAL LOW (ref 3.87–5.11)
Retic Count, Absolute: 67.8 10*3/uL (ref 19.0–186.0)
Retic Ct Pct: 1.8 % (ref 0.4–3.1)

## 2018-03-17 LAB — ABO/RH: ABO/RH(D): O POS

## 2018-03-17 LAB — FOLATE: Folate: 36.9 ng/mL (ref >5.9)

## 2018-03-17 LAB — HIV ANTIBODY (ROUTINE TESTING W REFLEX): HIV SCREEN 4TH GENERATION: NONREACTIVE

## 2018-03-17 LAB — FERRITIN: Ferritin: 61 ng/mL (ref 11–307)

## 2018-03-17 LAB — TSH: TSH: 0.409 u[IU]/mL (ref 0.350–4.500)

## 2018-03-17 MED ORDER — ONDANSETRON HCL 4 MG/2ML IJ SOLN: 4.0000 mg | Freq: Four times a day (QID) | INTRAMUSCULAR | Status: DC | PRN

## 2018-03-17 MED ORDER — NADOLOL 40 MG PO TABS
40.0000 mg | ORAL_TABLET | Freq: Two times a day (BID) | ORAL | Status: DC
  Administered 2018-03-17 – 2018-03-18 (×3): 40 mg via ORAL
  Filled 2018-03-17 (×3): qty 1

## 2018-03-17 MED ORDER — SODIUM CHLORIDE 0.9 % IV BOLUS
500.0000 mL | Freq: Once | INTRAVENOUS | Status: AC
  Administered 2018-03-17: 500 mL via INTRAVENOUS

## 2018-03-17 MED ORDER — PANTOPRAZOLE SODIUM 40 MG PO TBEC
40.0000 mg | DELAYED_RELEASE_TABLET | Freq: Every day | ORAL | Status: DC
  Administered 2018-03-17 – 2018-03-18 (×2): 40 mg via ORAL
  Filled 2018-03-17 (×2): qty 1

## 2018-03-17 MED ORDER — ACETAMINOPHEN 325 MG PO TABS
650.0000 mg | ORAL_TABLET | Freq: Four times a day (QID) | ORAL | Status: DC | PRN
  Administered 2018-03-17: 650 mg via ORAL
  Filled 2018-03-17: qty 2

## 2018-03-17 MED ORDER — ONDANSETRON HCL 4 MG PO TABS: 4.0000 mg | ORAL_TABLET | Freq: Four times a day (QID) | ORAL | Status: DC | PRN

## 2018-03-17 MED ORDER — APIXABAN 5 MG PO TABS
5.0000 mg | ORAL_TABLET | Freq: Two times a day (BID) | ORAL | Status: DC
  Administered 2018-03-17 – 2018-03-18 (×4): 5 mg via ORAL
  Filled 2018-03-17 (×5): qty 1

## 2018-03-17 MED ORDER — SODIUM CHLORIDE 0.9 % IV SOLN
INTRAVENOUS | Status: DC
  Administered 2018-03-17 – 2018-03-18 (×2): via INTRAVENOUS

## 2018-03-17 MED ORDER — ACETAMINOPHEN 650 MG RE SUPP: 650.0000 mg | Freq: Four times a day (QID) | RECTAL | Status: DC | PRN

## 2018-03-17 MED ORDER — POTASSIUM CHLORIDE CRYS ER 20 MEQ PO TBCR
40.0000 meq | EXTENDED_RELEASE_TABLET | Freq: Once | ORAL | Status: AC
  Administered 2018-03-17: 40 meq via ORAL
  Filled 2018-03-17: qty 2

## 2018-03-17 MED ORDER — NADOLOL 40 MG PO TABS: 40.0000 mg | ORAL_TABLET | Freq: Two times a day (BID) | ORAL | Status: DC

## 2018-03-17 MED ORDER — MAGNESIUM SULFATE 2 GM/50ML IV SOLN
2.0000 g | Freq: Once | INTRAVENOUS | Status: AC
  Administered 2018-03-17: 2 g via INTRAVENOUS
  Filled 2018-03-17: qty 50

## 2018-03-17 NOTE — H&P (Signed)
History and Physical    Anna Marquez LOV:[id number] DOB: 1950-05-16 DOA: 03/16/2018  PCP: Anna Nutting, DO  Patient coming from: Home.  Chief Complaint: Elevated heart rate.  HPI: Anna Marquez is a 68 y.o. female with history of paroxysmal atrial fibrillation, hypertension, mitral valve prolapse has been experiencing upper respiratory tract symptoms for last couple of days.  Patient has been in contact with grandson who has been diagnosed with croup.  Patient has been having cough and shortness of breath.  Gone to her PCP and was found to be in A. fib with RVR.  Over the last 2 days patient also had some palpitation and chest tightness.  3 days ago patient also had some diarrhea which was self-limited.  ED Course: In the ER patient was found to be in A. fib with RVR and after discussing with cardiology ER physician attempted cardioversion twice but did not succeed and patient was planned to be started on Cardizem infusion and admitted for further observation.  Patient usually takes nadolol twice daily.  In addition patient's labs show mildly worsening anemia with hypokalemia and hypomagnesemia.  Was given replacement for potassium and magnesium.  And admitted for further observation of A. fib with RVR.  Review of Systems: As per HPI, rest all negative.   Past Medical History:  Diagnosis Date  . Adenomatous colon polyp 2003  . Cholelithiasis   . Diverticulosis of colon   . Gastric polyps   . GERD (gastroesophageal reflux disease)   . Helicobacter pylori (H. pylori)   . Hiatal hernia   . Hypertension   . Mitral valve prolapse   . Nephrolithiasis   . Osteoarthritis   . Osteopenia 03/2017   T score -1.5 FRAX 8.9% / 0.9%.  Stable from prior DEXA 2016  . TGA (transient global amnesia)     Past Surgical History:  Procedure Laterality Date  . ANTERIOR AND POSTERIOR REPAIR N/A 06/14/2014   Procedure: ANTERIOR (CYSTOCELE) AND POSTERIOR REPAIR (RECTOCELE);  Surgeon: Anastasio Auerbach,  MD;  Location: [facility];  Service: Gynecology;  Laterality: N/A;  . CHOLECYSTECTOMY    . kidney stone removal     x2  . VAGINAL HYSTERECTOMY N/A 06/14/2014   Procedure: HYSTERECTOMY VAGINAL;  Surgeon: Anastasio Auerbach, MD;  Location: [facility];  Service: Gynecology;  Laterality: N/A;     reports that she has never smoked. She has never used smokeless tobacco. She reports current alcohol use. She reports that she does not use drugs.  Allergies  Allergen Reactions  . Penicillins Hives and Shortness Of Breath    Has patient had a PCN reaction causing immediate rash, facial/tongue/throat swelling, SOB or lightheadedness with hypotension: yes Has patient had a PCN reaction causing severe rash involving mucus membranes or skin necrosis: no Has patient had a PCN reaction that required hospitalization: unknown Has patient had a PCN reaction occurring within the last 10 years: no If all of the above answers are "NO", then may proceed with Cephalosporin use.   . Ibuprofen Other (See Comments)    Pt states allergy to NSAIDS is secondary to ulcers NOT anaphylaxis. Tolerates daily 325 ASA at home  . Erythromycin Nausea And Vomiting and Other (See Comments)    REACTION: Stomach upset    Family History  Problem Relation Age of Onset  . Cancer Father        prostate  . Breast cancer Paternal Aunt 14  . Diabetes Maternal Grandmother   . Heart disease Maternal Grandmother   .  Heart disease Maternal Grandfather   . Melanoma Paternal Aunt   . Neuropathy Mother   . Congestive Heart Failure Mother     Prior to Admission medications   Medication Sig Start Date End Date Taking? Authorizing Provider  albuterol (PROVENTIL HFA;VENTOLIN HFA) 108 (90 Base) MCG/ACT inhaler Inhale 1-2 puffs into the lungs every 6 (six) hours as needed for wheezing or shortness of breath. 01/16/18  Yes Wieters, Hallie C, PA-C  apixaban (ELIQUIS) 5 MG TABS tablet Take 1 tablet (5 mg total) by mouth 2 (two) times daily. 05/23/17   Yes Nahser, Wonda Cheng, MD  diphenhydrAMINE-APAP, sleep, (TYLENOL PM EXTRA STRENGTH PO) Take 2 tablets by mouth at bedtime as needed (pain).   Yes [provider]  fluticasone (FLONASE) 50 MCG/ACT nasal spray USE ONE SPRAY IN EACH NOSTRIL DAILY. Patient taking differently: Place 1 spray into both nostrils daily.  10/28/17  Yes Dorena Cookey, MD  hydrochlorothiazide (HYDRODIURIL) 25 MG tablet Take 1 tablet (25 mg total) by mouth daily. 10/28/17  Yes Dorena Cookey, MD  ibuprofen (ADVIL,MOTRIN) 200 MG tablet Take 400 mg by mouth every 6 (six) hours as needed for mild pain.   Yes [provider]  lisinopril (PRINIVIL,ZESTRIL) 10 MG tablet Take 1 tablet (10 mg total) by mouth daily. 10/28/17  Yes Dorena Cookey, MD  loratadine (CLARITIN) 10 MG tablet Take 10 mg by mouth daily.   Yes [provider]  nadolol (CORGARD) 40 MG tablet TAKE 1 TABLET BY MOUTH TWICE DAILY. Patient taking differently: Take 40 mg by mouth 2 (two) times daily.  10/28/17  Yes Dorena Cookey, MD  pantoprazole (PROTONIX) 40 MG tablet Take 1 tablet (40 mg total) by mouth daily. 10/28/17  Yes Dorena Cookey, MD    Physical Exam: Vitals:   03/16/18 2215 03/16/18 2300 03/16/18 2330 03/16/18 2345  BP: 100/84 106/81 96/63 95/70   Pulse: 88 (!) 147 98 (!) 120  Resp: 13 13 18 16   SpO2: 98% 98% 96% 96%  Weight:      Height:          Constitutional: Moderately built and nourished. Vitals:   03/16/18 2215 03/16/18 2300 03/16/18 2330 03/16/18 2345  BP: 100/84 106/81 96/63 95/70   Pulse: 88 (!) 147 98 (!) 120  Resp: 13 13 18 16   SpO2: 98% 98% 96% 96%  Weight:      Height:       Eyes: Anicteric no pallor. ENMT: No discharge from the ears eyes nose or mouth. Neck: No mass felt.  No neck rigidity.  No JVD appreciated. Respiratory: No rhonchi or crepitations. Cardiovascular: S1-S2 heard. Abdomen: Soft nontender bowel sounds present. Musculoskeletal: No edema.  No joint effusion. Skin: No  rash. Neurologic: Alert awake oriented to time place and person.  Moves all extremities. Psychiatric: Appears normal per normal affect.   Labs on Admission: I have personally reviewed following labs and imaging studies  CBC: Recent Labs  Lab 03/16/18 1641 03/16/18 2030  WBC 6.8  --   NEUTROABS 4.0  --   HGB 10.1* 8.8*  HCT 29.1* 26.0*  MCV 88.2  --   PLT 168  --    Basic Metabolic Panel: Recent Labs  Lab 03/16/18 1641 03/16/18 2030  NA 139 141  K 2.6* 3.4*  CL 111  --   CO2 20*  --   GLUCOSE 91  --   BUN 13  --   CREATININE 1.22* 1.00  CALCIUM 7.8*  --   MG  1.5*  --    GFR: Estimated Creatinine Clearance: 54 mL/min (by C-G formula based on SCr of 1 mg/dL). Liver Function Tests: No results for input(s): AST, ALT, ALKPHOS, BILITOT, PROT, ALBUMIN in the last 168 hours. No results for input(s): LIPASE, AMYLASE in the last 168 hours. No results for input(s): AMMONIA in the last 168 hours. Coagulation Profile: No results for input(s): INR, PROTIME in the last 168 hours. Cardiac Enzymes: No results for input(s): CKTOTAL, CKMB, CKMBINDEX, TROPONINI in the last 168 hours. BNP (last 3 results) No results for input(s): PROBNP in the last 8760 hours. HbA1C: No results for input(s): HGBA1C in the last 72 hours. CBG: Recent Labs  Lab 03/16/18 2231  GLUCAP 88   Lipid Profile: No results for input(s): CHOL, HDL, LDLCALC, TRIG, CHOLHDL, LDLDIRECT in the last 72 hours. Thyroid Function Tests: No results for input(s): TSH, T4TOTAL, FREET4, T3FREE, THYROIDAB in the last 72 hours. Anemia Panel: No results for input(s): VITAMINB12, FOLATE, FERRITIN, TIBC, IRON, RETICCTPCT in the last 72 hours. Urine analysis:    Component Value Date/Time   COLORURINE RED (A) 10/21/2017 0635   APPEARANCEUR CLOUDY (A) 10/21/2017 0635   LABSPEC 1.017 10/21/2017 0635   PHURINE 6.0 10/21/2017 0635   GLUCOSEU NEGATIVE 10/21/2017 0635   GLUCOSEU NEGATIVE 10/16/2009 0756   HGBUR LARGE (A)  10/21/2017 0635   HGBUR large 03/21/2009 1424   BILIRUBINUR Negative 10/28/2017 1736   KETONESUR NEGATIVE 10/21/2017 0635   PROTEINUR Negative 10/28/2017 1736   PROTEINUR 100 (A) 10/21/2017 0635   UROBILINOGEN 0.2 10/28/2017 1736   UROBILINOGEN 0.2 06/28/2014 1700   NITRITE Negative 10/28/2017 1736   NITRITE NEGATIVE 10/21/2017 0635   LEUKOCYTESUR Negative 10/28/2017 1736   Sepsis Labs: @LABRCNTIP (procalcitonin:4,lacticidven:4) )No results found for this or any previous visit (from the past 240 hour(s)).   Radiological Exams on Admission: Dg Chest 2 View  Result Date: 03/16/2018 CLINICAL DATA:  68 y/o F; one week of cough. Chest pain and shortness of breath this morning. Atrial fibrillation. History of mitral valve prolapse. EXAM: CHEST - 2 VIEW COMPARISON:  01/31/2017 chest radiograph FINDINGS: Stable cardiac silhouette within normal limits given projection and technique. Stable moderate hiatal hernia. Pulmonary venous hypertension. No consolidation, effusion, or pneumothorax. Bones are unremarkable. IMPRESSION: Pulmonary venous hypertension. Stable moderate hiatal hernia. Electronically Signed   By: Kristine Garbe M.D.   On: 03/16/2018 17:50    EKG: Independently reviewed.  A. fib with RVR.  Assessment/Plan Principal Problem:   Atrial fibrillation with RVR (HCC) Active Problems:   Essential hypertension    1. A. fib with RVR -could have been precipitated by recent upper respiratory tract infection.  Cardioversion was attempted but did not succeed.  If blood pressure allows will start Cardizem infusion.  On nadolol.  On apixaban.  Closely monitor and stepdown.  Check TSH cardiac markers. 2. Upper respiratory tract symptoms presently not wheezing on exam.  Closely observe.  Patient is afebrile.  3. Hypertension presently mildly on the low normal blood pressure.  Will hold lisinopril and hydrochlorothiazide.  We will continue nadolol if blood pressure allows. 4. Hypokalemia  and hypomagnesemia could be from diuretics and recent diarrhea.  Replace and recheck.  Hold hydrochlorothiazide. 5. Chronic kidney disease stage II with mild worsening of creatinine.  Hold lisinopril and hydrochlorothiazide for now.  Follow metabolic panel. 6. History of MVP. 7. Normocytic normochromic anemia with mild worsening of hemoglobin.  Although CBC serially.  Check anemia panel.   DVT prophylaxis: Apixaban. Code Status: Full code.  Family Communication: Discussed with family at the bedside. Disposition Plan: Home. Consults called: Cardiology. Admission status: Observation.   [name] MD [facility] Pager [phone].  If 7PM-7AM, please contact night-coverage [email or web address] Password [email or web address]  03/17/2018, 12:03 AM

## 2018-03-17 NOTE — Progress Notes (Signed)
[facility] TEAM 1 - Stepdown/ICU TEAM  Anna Marquez  PPJ:[id number] DOB: Sep 10, 1950 DOA: 03/16/2018 PCP: [name], DO    Brief Narrative:  [phone] w/ a hx of paroxysmal atrial fibrillation, hypertension, and mitral valve prolapse who had been experiencing upper respiratory tract symptoms for a couple of days following contact with a grandson who was diagnosed with croup. She went to her PCP and was found to be in A. fib with RVR.    In the ER patient was found to be in A. fib with RVR and after discussing with Cardiology the ER Physician attempted cardioversion twice but did not succeed. She was started on a Cardizem infusion.  Significant Events: 3/10 admit   Subjective: Pt is seen for a f/u visit.    Assessment & Plan:  A. fib with RVR precipitated by URI - DCCV in ED did not succeed - cont apixaban - TSH is low normal - check FT3 and FT4  Upper respiratory tract infection  Hypertension   Hypokalemia  Hypomagnesemia   Chronic kidney disease stage II   MVP  Normocytic normochromic anemia    DVT prophylaxis: apixiban Code Status: FULL CODE Family Communication:  Disposition Plan:   Consultants:  none  Antimicrobials:  none   Objective: Blood pressure (!) 91/56, pulse 95, resp. rate 18, height 5\' 4"  (1.626 m), weight 79 kg, SpO2 96 %.  Intake/Output Summary (Last 24 hours) at 03/17/2018 1156 Last data filed at 03/16/2018 2025 Gross per 24 hour  Intake 150 ml  Output -  Net 150 ml   Filed Weights   03/16/18 1631  Weight: 79 kg    Examination: Pt was seen for a f/u visit.    CBC: Recent Labs  Lab 03/16/18 1641 03/16/18 2030 03/17/18 0347 03/17/18 0544  WBC 6.8  --  8.7 6.8  NEUTROABS 4.0  --   --  3.6  HGB 10.1* 8.8* 11.4* 10.6*  HCT 29.1* 26.0* 33.8* 32.1*  MCV 88.2  --  88.3 87.7  PLT 168  --  201 983   Basic Metabolic Panel: Recent Labs  Lab 03/16/18 1641 03/16/18 2030 03/17/18 0544  NA 139 141 138  K 2.6* 3.4* 3.3*  CL 111  --   108  CO2 20*  --  20*  GLUCOSE 91  --  93  BUN 13  --  13  CREATININE 1.22* 1.00 1.20*  CALCIUM 7.8*  --  8.6*  MG 1.5*  --   --    GFR: Estimated Creatinine Clearance: 45 mL/min (A) (by C-G formula based on SCr of 1.2 mg/dL (H)).  Liver Function Tests: Recent Labs  Lab 03/17/18 0544  AST 15  ALT 14  ALKPHOS 33*  BILITOT 1.5*  PROT 6.0*  ALBUMIN 3.2*    Cardiac Enzymes: Recent Labs  Lab 03/17/18 0347 03/17/18 0544  TROPONINI <0.03 <0.03    HbA1C: Hgb A1c MFr Bld  Date/Time Value Ref Range Status  08/21/2015 05:56 AM 5.1 4.8 - 5.6 % Final    Comment:    (NOTE)         Pre-diabetes: 5.7 - 6.4         Diabetes: >6.4         Glycemic control for adults with diabetes: <7.0     CBG: Recent Labs  Lab 03/16/18 2231  GLUCAP 88     Scheduled Meds: . apixaban  5 mg Oral BID  . diltiazem  15 mg Intravenous Once  . nadolol  40 mg Oral BID  . pantoprazole  40 mg Oral Daily     LOS: 0 days   Time spent: No Charge  Cherene Altes, MD [facility] Office  [phone] Pager - Text Page per Amion as per below:  On-Call/Text Page:      [email or web address]  If 7PM-7AM, please contact night-coverage [email or web address] 03/17/2018, 11:56 AM

## 2018-03-17 NOTE — ED Notes (Signed)
Breakfast Tray Ordered. 

## 2018-03-17 NOTE — ED Notes (Signed)
Lunch tray ordered 

## 2018-03-17 NOTE — ED Notes (Signed)
ED TO INPATIENT HANDOFF REPORT  ED Nurse Name and Phone #: Sebastain Fishbaugh [phone]  S Name/Age/Gender Anna Marquez 68 y.o. female Room/Bed: 021C/021C  Code Status   Code Status: Full Code  Home/SNF/Other Home Patient oriented to: self, place, time and situation Is this baseline? Yes   Triage Complete: Triage complete  Chief Complaint AFIB  Triage Note Pt to PCP this am with SOB and dry cough since Saturday. EKG in office found afib 1200-170s. Pt does have Hx PAF on eliquis. A/O x 4, well appearing and not in distress.  EMS Vitals... afib 120-170s BP 123/73 98% RA Intermittent CP/pressure 4/10 20 rr  EMS gave 1000 cc NS bolus and 15 mg total IV metoprolol en route without improvement to rate/rhythm.    Allergies Allergies  Allergen Reactions  . Penicillins Hives and Shortness Of Breath    Has patient had a PCN reaction causing immediate rash, facial/tongue/throat swelling, SOB or lightheadedness with hypotension: yes Has patient had a PCN reaction causing severe rash involving mucus membranes or skin necrosis: no Has patient had a PCN reaction that required hospitalization: unknown Has patient had a PCN reaction occurring within the last 10 years: no If all of the above answers are "NO", then may proceed with Cephalosporin use.   . Ibuprofen Other (See Comments)    Pt states allergy to NSAIDS is secondary to ulcers NOT anaphylaxis. Tolerates daily 325 ASA at home  . Erythromycin Nausea And Vomiting and Other (See Comments)    REACTION: Stomach upset    Level of Care/Admitting Diagnosis ED Disposition    ED Disposition Condition Comment   Admit  Hospital Area: [facility] [[id number]]  Level of Care: Progressive [102]  I expect the patient will be discharged within 24 hours: No (not a candidate for 5C-Observation unit)  Diagnosis: Atrial fibrillation with RVR [facility]) [[id number]]  Admitting Physician: [name] [phone]  Attending Physician:  [name] [[id number]]  PT Class (Do Not Modify): Observation [104]  PT Acc Code (Do Not Modify): Observation [10022]       B Medical/Surgery History Past Medical History:  Diagnosis Date  . Adenomatous colon polyp 2003  . Cholelithiasis   . Diverticulosis of colon   . Gastric polyps   . GERD (gastroesophageal reflux disease)   . Helicobacter pylori (H. pylori)   . Hiatal hernia   . Hypertension   . Mitral valve prolapse   . Nephrolithiasis   . Osteoarthritis   . Osteopenia 03/2017   T score -1.5 FRAX 8.9% / 0.9%.  Stable from prior DEXA 2016  . TGA (transient global amnesia)    Past Surgical History:  Procedure Laterality Date  . ANTERIOR AND POSTERIOR REPAIR N/A 06/14/2014   Procedure: ANTERIOR (CYSTOCELE) AND POSTERIOR REPAIR (RECTOCELE);  Surgeon: Anastasio Auerbach, MD;  Location: [facility];  Service: Gynecology;  Laterality: N/A;  . CHOLECYSTECTOMY    . kidney stone removal     x2  . VAGINAL HYSTERECTOMY N/A 06/14/2014   Procedure: HYSTERECTOMY VAGINAL;  Surgeon: Anastasio Auerbach, MD;  Location: [facility];  Service: Gynecology;  Laterality: N/A;     A IV Location/Drains/Wounds Patient Lines/Drains/Airways Status   Active Line/Drains/Airways    Name:   Placement date:   Placement time:   Site:   Days:   Peripheral IV 03/16/18 Left Antecubital   03/16/18    1707    Antecubital   1   Incision (Closed) 06/14/14 Vagina Other (Comment)   06/14/14  1025     1372          Intake/Output Last 24 hours  Intake/Output Summary (Last 24 hours) at 03/17/2018 1552 Last data filed at 03/16/2018 2025 Gross per 24 hour  Intake 150 ml  Output -  Net 150 ml    Labs/Imaging Results for orders placed or performed during the hospital encounter of 03/16/18 (from the past 48 hour(s))  CBC with Differential     Status: Abnormal   Collection Time: 03/16/18  4:41 PM  Result Value Ref Range   WBC 6.8 4.0 - 10.5 K/uL   RBC 3.30 (L) 3.87 - 5.11 MIL/uL   Hemoglobin 10.1 (L) 12.0 -  15.0 g/dL   HCT 29.1 (L) 36.0 - 46.0 %   MCV 88.2 80.0 - 100.0 fL   MCH 30.6 26.0 - 34.0 pg   MCHC 34.7 30.0 - 36.0 g/dL   RDW 13.3 11.5 - 15.5 %   Platelets 168 150 - 400 K/uL   nRBC 0.0 0.0 - 0.2 %   Neutrophils Relative % 58 %   Neutro Abs 4.0 1.7 - 7.7 K/uL   Lymphocytes Relative 30 %   Lymphs Abs 2.0 0.7 - 4.0 K/uL   Monocytes Relative 8 %   Monocytes Absolute 0.5 0.1 - 1.0 K/uL   Eosinophils Relative 3 %   Eosinophils Absolute 0.2 0.0 - 0.5 K/uL   Basophils Relative 1 %   Basophils Absolute 0.0 0.0 - 0.1 K/uL   Immature Granulocytes 0 %   Abs Immature Granulocytes 0.02 0.00 - 0.07 K/uL    Comment: Performed at [facility], [address] [place], [place] [address]  Basic metabolic panel     Status: Abnormal   Collection Time: 03/16/18  4:41 PM  Result Value Ref Range   Sodium 139 135 - 145 mmol/L   Potassium 2.6 (LL) 3.5 - 5.1 mmol/L    Comment: CRITICAL RESULT CALLED TO, READ BACK BY AND VERIFIED WITH: Cheree Ditto 1804 03/16/2018 WBOND    Chloride 111 98 - 111 mmol/L   CO2 20 (L) 22 - 32 mmol/L   Glucose, Bld 91 70 - 99 mg/dL   BUN 13 8 - 23 mg/dL   Creatinine, Ser 1.22 (H) 0.44 - 1.00 mg/dL   Calcium 7.8 (L) 8.9 - 10.3 mg/dL   GFR calc non Af Amer 45 (L) >60 mL/min   GFR calc Af Amer 52 (L) >60 mL/min   Anion gap 8 5 - 15    Comment: Performed at [facility] [address] [place], [place] [address]  Brain natriuretic peptide     Status: Abnormal   Collection Time: 03/16/18  4:41 PM  Result Value Ref Range   B Natriuretic Peptide 171.9 (H) 0.0 - 100.0 pg/mL    Comment: Performed at [facility] [address], [place], [place] [address]  Magnesium     Status: Abnormal   Collection Time: 03/16/18  4:41 PM  Result Value Ref Range   Magnesium 1.5 (L) 1.7 - 2.4 mg/dL    Comment: Performed at [facility] [address] [place],  [address]  I-stat Creatinine, ED     Status: None   Collection Time: 03/16/18  8:30 PM  Result  Value Ref Range   Creatinine, Ser 1.00 0.44 - 1.00 mg/dL  POCT I-Stat EG7     Status: Abnormal   Collection Time: 03/16/18  8:30 PM  Result Value Ref Range  pH, Ven 7.410 7.250 - 7.430   pCO2, Ven 31.7 (L) 44.0 - 60.0 mmHg   pO2, Ven 36.0 32.0 - 45.0 mmHg   Bicarbonate 20.1 20.0 - 28.0 mmol/L   TCO2 21 (L) 22 - 32 mmol/L   O2 Saturation 70.0 %   Acid-base deficit 4.0 (H) 0.0 - 2.0 mmol/L   Sodium 141 135 - 145 mmol/L   Potassium 3.4 (L) 3.5 - 5.1 mmol/L   Calcium, Ion 1.06 (L) 1.15 - 1.40 mmol/L   HCT 26.0 (L) 36.0 - 46.0 %   Hemoglobin 8.8 (L) 12.0 - 15.0 g/dL   Patient temperature HIDE    Sample type VENOUS    Comment NOTIFIED PHYSICIAN   CBG monitoring, ED     Status: None   Collection Time: 03/16/18 10:31 PM  Result Value Ref Range   Glucose-Capillary 88 70 - 99 mg/dL  Type and screen [place]     Status: None   Collection Time: 03/17/18  3:45 AM  Result Value Ref Range   ABO/RH(D) O POS    Antibody Screen NEG    Sample Expiration      03/20/2018 Performed at [facility], [address] [place], [place] [address]   ABO/Rh     Status: None   Collection Time: 03/17/18  3:45 AM  Result Value Ref Range   ABO/RH(D)      O POS Performed at [facility] [address], [place], [place] [address]   HIV antibody (Routine Testing)     Status: None   Collection Time: 03/17/18  3:47 AM  Result Value Ref Range   HIV Screen 4th Generation wRfx Non Reactive Non Reactive    Comment: (NOTE) Performed At: [facility] [place], [place] [id number] Rush Farmer MD [id number]   TSH     Status: None   Collection Time: 03/17/18  3:47 AM  Result Value Ref Range   TSH 0.409 0.350 - 4.500 uIU/mL    Comment: Performed by a 3rd Generation assay with a functional sensitivity of <=0.01 uIU/mL. Performed at [facility], [place] [address], [place], [place] [address]   Troponin I - Now Then Q6H     Status: None    Collection Time: 03/17/18  3:47 AM  Result Value Ref Range   Troponin I <0.03 <0.03 ng/mL    Comment: Performed at [facility] [address] [place], [place] [address]  Vitamin B12     Status: None   Collection Time: 03/17/18  3:47 AM  Result Value Ref Range   Vitamin B-12 292 180 - 914 pg/mL    Comment: (NOTE) This assay is not validated for testing neonatal or myeloproliferative syndrome specimens for Vitamin B12 levels. Performed at [facility], [place] [address] [place], [place] [address]   Folate     Status: None   Collection Time: 03/17/18  3:47 AM  Result Value Ref Range   Folate 36.9 >5.9 ng/mL    Comment: Performed at [facility] [address], [place], [place] [address]  Iron and TIBC     Status: None   Collection Time: 03/17/18  3:47 AM  Result Value Ref Range   Iron 75 28 - 170 ug/dL   TIBC 328 250 - 450 ug/dL   Saturation Ratios 23 10.4 - 31.8 %   UIBC 253 ug/dL    Comment: Performed at [facility], [address] [place], [place] [address]  Ferritin     Status: None   Collection Time: 03/17/18  3:47 AM  Result Value Ref Range   Ferritin 61 11 - 307 ng/mL    Comment: Performed at [facility] [address] [place], [place] [address]  Reticulocytes     Status: Abnormal   Collection Time: 03/17/18  3:47 AM  Result Value Ref Range   Retic Ct Pct 1.8 0.4 - 3.1 %   RBC. 3.83 (L) 3.87 - 5.11 MIL/uL   Retic Count, Absolute 67.8 19.0 - 186.0 K/uL   Immature Retic Fract 12.5 2.3 - 15.9 %    Comment: Performed at [facility] [address], [place], [place] [address]  CBC     Status: Abnormal   Collection Time: 03/17/18  3:47 AM  Result Value Ref Range   WBC 8.7 4.0 - 10.5 K/uL   RBC 3.83 (L) 3.87 - 5.11 MIL/uL   Hemoglobin 11.4 (L) 12.0 - 15.0 g/dL    Comment: REPEATED TO VERIFY   HCT 33.8 (L) 36.0 - 46.0 %   MCV 88.3 80.0 - 100.0 fL   MCH 29.8 26.0 - 34.0 pg   MCHC 33.7 30.0 - 36.0 g/dL   RDW 13.7 11.5 - 15.5 %    Platelets 201 150 - 400 K/uL   nRBC 0.0 0.0 - 0.2 %    Comment: Performed at [facility], [place] [address], [place], [place] [address]  Basic metabolic panel     Status: Abnormal   Collection Time: 03/17/18  5:44 AM  Result Value Ref Range   Sodium 138 135 - 145 mmol/L   Potassium 3.3 (L) 3.5 - 5.1 mmol/L   Chloride 108 98 - 111 mmol/L   CO2 20 (L) 22 - 32 mmol/L   Glucose, Bld 93 70 - 99 mg/dL   BUN 13 8 - 23 mg/dL   Creatinine, Ser 1.20 (H) 0.44 - 1.00 mg/dL   Calcium 8.6 (L) 8.9 - 10.3 mg/dL   GFR calc non Af Amer 46 (L) >60 mL/min   GFR calc Af Amer 53 (L) >60 mL/min   Anion gap 10 5 - 15    Comment: Performed at [facility] [address], [place], [place] [address]  Hepatic function panel     Status: Abnormal   Collection Time: 03/17/18  5:44 AM  Result Value Ref Range   Total Protein 6.0 (L) 6.5 - 8.1 g/dL   Albumin 3.2 (L) 3.5 - 5.0 g/dL   AST 15 15 - 41 U/L   ALT 14 0 - 44 U/L   Alkaline Phosphatase 33 (L) 38 - 126 U/L   Total Bilirubin 1.5 (H) 0.3 - 1.2 mg/dL   Bilirubin, Direct 0.2 0.0 - 0.2 mg/dL   Indirect Bilirubin 1.3 (H) 0.3 - 0.9 mg/dL    Comment: Performed at [facility] [address] [place], [place] [address]  CBC WITH DIFFERENTIAL     Status: Abnormal   Collection Time: 03/17/18  5:44 AM  Result Value Ref Range   WBC 6.8 4.0 - 10.5 K/uL   RBC 3.66 (L) 3.87 - 5.11 MIL/uL   Hemoglobin 10.6 (L) 12.0 - 15.0 g/dL   HCT 32.1 (L) 36.0 - 46.0 %   MCV 87.7 80.0 - 100.0 fL   MCH 29.0 26.0 - 34.0 pg   MCHC 33.0 30.0 - 36.0 g/dL   RDW 13.5 11.5 - 15.5 %  Platelets 172 150 - 400 K/uL   nRBC 0.0 0.0 - 0.2 %   Neutrophils Relative % 53 %   Neutro Abs 3.6 1.7 - 7.7 K/uL   Lymphocytes Relative 36 %   Lymphs Abs 2.4 0.7 - 4.0 K/uL   Monocytes Relative 8 %   Monocytes Absolute 0.5 0.1 - 1.0 K/uL   Eosinophils Relative 2 %   Eosinophils Absolute 0.2 0.0 - 0.5 K/uL   Basophils Relative 1 %   Basophils Absolute 0.0 0.0 - 0.1 K/uL    Immature Granulocytes 0 %   Abs Immature Granulocytes 0.01 0.00 - 0.07 K/uL    Comment: Performed at [facility] [address], [place], [place] [address]  Troponin I - Now Then Q6H     Status: None   Collection Time: 03/17/18  5:44 AM  Result Value Ref Range   Troponin I <0.03 <0.03 ng/mL    Comment: Performed at [facility] [address], [place], [place] [address]  Troponin I - Now Then Q6H     Status: None   Collection Time: 03/17/18 12:06 PM  Result Value Ref Range   Troponin I <0.03 <0.03 ng/mL    Comment: Performed at [facility] [address] [place], [place] [address]   Dg Chest 2 View  Result Date: 03/16/2018 CLINICAL DATA:  68 y/o F; one week of cough. Chest pain and shortness of breath this morning. Atrial fibrillation. History of mitral valve prolapse. EXAM: CHEST - 2 VIEW COMPARISON:  01/31/2017 chest radiograph FINDINGS: Stable cardiac silhouette within normal limits given projection and technique. Stable moderate hiatal hernia. Pulmonary venous hypertension. No consolidation, effusion, or pneumothorax. Bones are unremarkable. IMPRESSION: Pulmonary venous hypertension. Stable moderate hiatal hernia. Electronically Signed   By: Kristine Garbe M.D.   On: 03/16/2018 17:50    Pending Labs Unresulted Labs (From admission, onward)    Start     Ordered   03/18/18 0500  Magnesium  Tomorrow morning,   R     03/17/18 1224   03/18/18 0500  Comprehensive metabolic panel  Tomorrow morning,   R     03/17/18 1224   03/18/18 0500  CBC  Tomorrow morning,   R     03/17/18 1224          Vitals/Pain Today's Vitals   03/17/18 1430 03/17/18 1500 03/17/18 1530 03/17/18 1545  BP: (!) 85/60 90/74 111/83   Pulse: 64 (!) 133 87 84  Resp: (!) 21 (!) 22 18 18   SpO2: 96% 94% 97% 96%  Weight:      Height:      PainSc:        Isolation Precautions No active isolations  Medications Medications  fentaNYL (SUBLIMAZE) 100 MCG/2ML injection (  See  Procedure Record 03/16/18 2200)  diltiazem (CARDIZEM) 1 mg/mL load via infusion 15 mg (0 mg Intravenous Hold 03/17/18 0054)    And  diltiazem (CARDIZEM) 100 mg in dextrose 5% 187mL (1 mg/mL) infusion (0 mg/hr Intravenous Hold 03/17/18 0054)  pantoprazole (PROTONIX) EC tablet 40 mg (40 mg Oral Given 03/17/18 0949)  apixaban (ELIQUIS) tablet 5 mg (5 mg Oral Given 03/17/18 1118)  ondansetron (ZOFRAN) tablet 4 mg (has no administration in time range)    Or  ondansetron (ZOFRAN) injection 4 mg (has no administration in time range)  nadolol (CORGARD) tablet 40 mg (40 mg Oral Given 03/17/18 1118)  0.9 %  sodium chloride infusion ( Intravenous New Bag/Given 03/17/18 1332)  acetaminophen (TYLENOL) tablet 650 mg (has no administration in time range)  magnesium sulfate IVPB 2 g 50 mL (0 g Intravenous Stopped 03/16/18 1928)  potassium chloride SA (K-DUR,KLOR-CON) CR tablet 40 mEq (40 mEq Oral Given 03/16/18 1823)  potassium chloride 10 mEq in 100 mL IVPB (0 mEq Intravenous Stopped 03/16/18 2025)  fentaNYL (SUBLIMAZE) injection (50 mcg Intravenous Given 03/16/18 2154)  etomidate (AMIDATE) injection (10 mg Intravenous Given 03/16/18 2208)  sodium chloride 0.9 % bolus 500 mL (500 mLs Intravenous New Bag/Given 03/17/18 1336)  potassium chloride SA (K-DUR,KLOR-CON) CR tablet 40 mEq (40 mEq Oral Given 03/17/18 1326)  magnesium sulfate IVPB 2 g 50 mL (0 g Intravenous Stopped 03/17/18 1433)    Mobility walks Low fall risk   Focused Assessments Cardiac Assessment Handoff:  Cardiac Rhythm: Atrial fibrillation Lab Results  Component Value Date   TROPONINI <0.03 03/17/2018   Lab Results  Component Value Date   DDIMER 1.57 (H) 03/07/2016   Does the Patient currently have chest pain? No     R Recommendations: See Admitting Provider Note  Report given to:   Additional Notes:

## 2018-03-17 NOTE — Consult Note (Signed)
Cardiology Consult    Patient ID: Anna Marquez MRN: [id number], DOB/AGE: March 07, 1950   Admit date: 03/16/2018 Date of Consult: 03/17/2018  Primary Physician: [name], DO Primary Cardiologist: Mertie Moores, MD Requesting Provider: Joette Catching, MD  Patient Profile    Anna Marquez is a 67 y.o. female with a history of paroxsymal atrial fibrillation on Eliquis, mitral valve prolapse, hypertension, and asthma who is being seen today for the evaluation of atrial fibrillation at the request of Dr. Thereasa Solo.  History of Present Illness    Anna Marquez is a 68 year old female with a history of paroxsymal atrial fibrillation, mitral valve prolapse, hypertension, and asthma who is followed by Dr. Acie Fredrickson. Patient last saw Dr. Acie Fredrickson in 05/2017 for annual follow-up at which time she reported some palpitations but was otherwise doing well from a cardiac standpoint.  Patient presented to the ED yesterday via EMS from her PCPs office for evaluation of atrial fibrillation. Patient reports her grandson was diagnosed with croup last week and she caught some of his respiratory symptoms including nasal congestion, non-productive cough, and shortness of breath. Patient states she has a history of "asthmatic bronchitis" and thought she was developing that. However, on Friday (3/6), patient noticed some "fluttering" in her chest test and knew she was in atrial fibrillation. Patient has been told in the past to take an extra dose of Nadolol if she is having prolonged palpitations, so on Saturday (3/7), she took an extra dose of this with improvement. However, on Sunday her symptoms worsened and she started to have some chest heaviness with brief "twinges" and shortness of breath. Patient states the chest discomfort was similar to prior episodes of reflux. Patient denies any exertional chest pain or shortness of breath. Patient sleep on a incline due to her reflux but denies any orthopnea, PND, or edema. She went  to see her PCP yesterday and was found to be in atrial fibrillation with RVR so PCP sent her to the ED for further evaluation. EMS gave 3 doses of IV Metoprolol 5mg  as well as 1 L of normal saline while en route to the ED.  In the ED, patient tachycardic with heart rate in the 140's and hypotensive with systolic BP in the 88'F at times. EKG showed atrial fibrillation with a ventricular rate of 148 bpm. Chest x-ray showed pulmonary venous hypertension and a stable moderate hiatal hernia. BNP mildly elevated at 171.9. WBC 6.8, Hgb 10.1, Plts 168. Na 139, K 2.6, Mg 1.5, Glucose 91, SCr 1.22. Two attempts at cardioversion were attempted in the ED but were unsuccessful. Cardizem bolus and drip was order but never started due to hypotension. Potassium and magnesium were repleted in the ED.   At the time of this evaluation, patient has converted back to normal sinus rhythm and is asymptomatic.  Patient denies any history of tobacco use. She reports occasional alcohol use. Patient has a family history of heart disease with her maternal grandmother and maternal grandfather dying of a heart attack in their 46's and her paternal grandmother dying of a heart attack in her 55's.  Past Medical History   Past Medical History:  Diagnosis Date  . Adenomatous colon polyp 2003  . Cholelithiasis   . Diverticulosis of colon   . Gastric polyps   . GERD (gastroesophageal reflux disease)   . Helicobacter pylori (H. pylori)   . Hiatal hernia   . Hypertension   . Mitral valve prolapse   . Nephrolithiasis   .  Osteoarthritis   . Osteopenia 03/2017   T score -1.5 FRAX 8.9% / 0.9%.  Stable from prior DEXA 2016  . TGA (transient global amnesia)     Past Surgical History:  Procedure Laterality Date  . ANTERIOR AND POSTERIOR REPAIR N/A 06/14/2014   Procedure: ANTERIOR (CYSTOCELE) AND POSTERIOR REPAIR (RECTOCELE);  Surgeon: Anastasio Auerbach, MD;  Location: [facility];  Service: Gynecology;  Laterality: N/A;  .  CHOLECYSTECTOMY    . kidney stone removal     x2  . VAGINAL HYSTERECTOMY N/A 06/14/2014   Procedure: HYSTERECTOMY VAGINAL;  Surgeon: Anastasio Auerbach, MD;  Location: [facility];  Service: Gynecology;  Laterality: N/A;     Allergies  Allergies  Allergen Reactions  . Penicillins Hives and Shortness Of Breath    Has patient had a PCN reaction causing immediate rash, facial/tongue/throat swelling, SOB or lightheadedness with hypotension: yes Has patient had a PCN reaction causing severe rash involving mucus membranes or skin necrosis: no Has patient had a PCN reaction that required hospitalization: unknown Has patient had a PCN reaction occurring within the last 10 years: no If all of the above answers are "NO", then may proceed with Cephalosporin use.   . Ibuprofen Other (See Comments)    Pt states allergy to NSAIDS is secondary to ulcers NOT anaphylaxis. Tolerates daily 325 ASA at home  . Erythromycin Nausea And Vomiting and Other (See Comments)    REACTION: Stomach upset    Inpatient Medications    . apixaban  5 mg Oral BID  . diltiazem  15 mg Intravenous Once  . nadolol  40 mg Oral BID  . pantoprazole  40 mg Oral Daily    Family History    Family History  Problem Relation Age of Onset  . Cancer Father        prostate  . Breast cancer Paternal Aunt 25  . Diabetes Maternal Grandmother   . Heart disease Maternal Grandmother   . Heart disease Maternal Grandfather   . Melanoma Paternal Aunt   . Neuropathy Mother   . Congestive Heart Failure Mother    She indicated that her mother is deceased. She indicated that her father is deceased. She indicated that her maternal grandmother is deceased. She indicated that her maternal grandfather is deceased. She indicated that her paternal grandmother is deceased. She indicated that her paternal grandfather is deceased.   Social History    Social History   Socioeconomic History  . Marital status: Divorced    Spouse name: Not on file   . Number of children: Not on file  . Years of education: Not on file  . Highest education level: Not on file  Occupational History  . Not on file  Social Needs  . Financial resource strain: Not on file  . Food insecurity:    Worry: Not on file    Inability: Not on file  . Transportation needs:    Medical: Not on file    Non-medical: Not on file  Tobacco Use  . Smoking status: Never Smoker  . Smokeless tobacco: Never Used  Substance and Sexual Activity  . Alcohol use: Yes    Alcohol/week: 0.0 standard drinks    Comment: Occas  . Drug use: No  . Sexual activity: Yes    Birth control/protection: Post-menopausal    Comment: 1st intercourse 68 yo-Fewer than 5 partners  Lifestyle  . Physical activity:    Days per week: Not on file    Minutes per session: Not  on file  . Stress: Not on file  Relationships  . Social connections:    Talks on phone: Not on file    Gets together: Not on file    Attends religious service: Not on file    Active member of club or organization: Not on file    Attends meetings of clubs or organizations: Not on file    Relationship status: Not on file  . Intimate partner violence:    Fear of current or ex partner: Not on file    Emotionally abused: Not on file    Physically abused: Not on file    Forced sexual activity: Not on file  Other Topics Concern  . Not on file  Social History Narrative  . Not on file     Review of Systems    Review of Systems  Constitutional: Positive for malaise/fatigue. Negative for chills, diaphoresis and fever.  HENT: Positive for congestion.   Eyes: Negative for blurred vision and double vision.  Respiratory: Positive for cough and shortness of breath. Negative for hemoptysis and sputum production.   Cardiovascular: Positive for chest pain and palpitations. Negative for orthopnea, leg swelling and PND.  Gastrointestinal: Positive for heartburn. Negative for blood in stool, nausea and vomiting.  Genitourinary:  Negative for hematuria.  Musculoskeletal: Negative for myalgias.  Neurological: Negative for dizziness, tingling and loss of consciousness.  Endo/Heme/Allergies: Does not bruise/bleed easily.  Psychiatric/Behavioral: Negative for substance abuse.    Physical Exam    Blood pressure 111/83, pulse 84, resp. rate 18, height 5\' 4"  (1.626 m), weight 79 kg, SpO2 96 %.  General: 68 y.o. female resting comfortably in no acute distress. Pleasant and cooperative. HEENT: Normal  Neck: Supple. No carotid bruits or JVD appreciated. Lungs: No increased work of breathing. Clear to auscultation bilaterally. No wheezes, rhonchi, or rales. Heart: RRR. Distinct S1 and S2. No murmurs, gallops, or rubs.  Abdomen: Soft, non-distended, and non-tender to palpation. Bowel sounds present in all 4 quadrants.   Extremities: No lower extremity edema. Radial pulses 2+ and equal bilaterally. Skin: Warm and dry. Neuro: Alert and oriented x3. No focal deficits. Moves all extremities spontaneously. Psych: Normal affect. Responds appropriately.   Labs    Troponin (Point of Care Test) No results for input(s): TROPIPOC in the last 72 hours. Recent Labs    03/17/18 0347 03/17/18 0544 03/17/18 1206  TROPONINI <0.03 <0.03 <0.03   Lab Results  Component Value Date   WBC 6.8 03/17/2018   HGB 10.6 (L) 03/17/2018   HCT 32.1 (L) 03/17/2018   MCV 87.7 03/17/2018   PLT 172 03/17/2018    Recent Labs  Lab 03/17/18 0544  NA 138  K 3.3*  CL 108  CO2 20*  BUN 13  CREATININE 1.20*  CALCIUM 8.6*  PROT 6.0*  BILITOT 1.5*  ALKPHOS 33*  ALT 14  AST 15  GLUCOSE 93   Lab Results  Component Value Date   CHOL 194 10/28/2017   HDL 57.10 10/28/2017   LDLCALC 103 (H) 10/28/2017   TRIG 170.0 (H) 10/28/2017   Lab Results  Component Value Date   DDIMER 1.57 (H) 03/07/2016     Radiology Studies    Dg Chest 2 View  Result Date: 03/16/2018 CLINICAL DATA:  68 y/o F; one week of cough. Chest pain and shortness of  breath this morning. Atrial fibrillation. History of mitral valve prolapse. EXAM: CHEST - 2 VIEW COMPARISON:  01/31/2017 chest radiograph FINDINGS: Stable cardiac silhouette within normal limits given  projection and technique. Stable moderate hiatal hernia. Pulmonary venous hypertension. No consolidation, effusion, or pneumothorax. Bones are unremarkable. IMPRESSION: Pulmonary venous hypertension. Stable moderate hiatal hernia. Electronically Signed   By: Kristine Garbe M.D.   On: 03/16/2018 17:50   Mm 3d Screen Breast Bilateral  Result Date: 03/03/2018 CLINICAL DATA:  Screening. EXAM: DIGITAL SCREENING BILATERAL MAMMOGRAM WITH TOMO AND CAD COMPARISON:  Previous exam(s). ACR Breast Density Category b: There are scattered areas of fibroglandular density. FINDINGS: There are no findings suspicious for malignancy. Images were processed with CAD. IMPRESSION: No mammographic evidence of malignancy. A result letter of this screening mammogram will be mailed directly to the patient. RECOMMENDATION: Screening mammogram in one year. (Code:SM-B-01Y) BI-RADS CATEGORY  1: Negative. Electronically Signed   By: Dorise Bullion III M.D   On: 03/03/2018 12:37    EKG     EKG: EKG was personally reviewed and demonstrates: Atrial fibrillation with ventricular rate of 148.  Telemetry: Telemetry was personally reviewed and demonstrates: Patient converted from atrial fibrillation to normal sinus rhythm with heart rates in the 80's a little after 3:00pm. While in atrial fibrillation, max heart rate in the 150's. Short run of non-sustained VT of 8 beats also noted.   Cardiac Imaging    Echocardiogram 03/07/2016: Study Conclusions: - Left ventricle: The cavity size was normal. There was mild   concentric hypertrophy. Systolic function was normal. The   estimated ejection fraction was in the range of 55% to 60%. Wall   motion was normal; there were no regional wall motion   abnormalities. Doppler parameters are  consistent with abnormal   left ventricular relaxation (grade 1 diastolic dysfunction). - Mitral valve: There was mild regurgitation. _______________  Exercise Tolerance Test 03/07/2016:  Blood pressure demonstrated a normal response to exercise.  There was no ST segment deviation noted during stress.   Assessment & Plan    Paroxysmal Atrial Fibrillation with RVR - Patient presented from [facility]Ps for evaluation of atrial fibrillation with RVR. On arrival to the ED, patient was tachycardic with heart rates in the 140's and hypotensive with systolic BP in the 66'Q. Cardioversion was attempted twice in the ED but was unsuccessful. Cardizem bolus and drip were ordered but was not started due to hypotension. Initial potassium was 2.6 and initial magnesium was 1.5. Both were repleted in the ED. - Patient converted back to normal sinus rhythm with heart rates in the 80's a little after 3pm.  - TSH normal. - Goal Potassium >4.0. - Goal Magnesium > 2.0. - BNP mildly elevated at 171.9.  - Last Echo in 03/2016 showed LVEF of 55-60%. Given that this was 2 years ago, think it is reasonable to recheck Echo. - Continue home Nadolol 40mg  twice daily.  - Continue chronic anticoagulation with Eliquis 5mg  twice daily.  Hypotensive - BP has improved as heart rate has improved. Most recent systolic BP in the 947'M. - Continue home Nadolol. - Continue to hold home HCTZ and Lisinopril at this time.   Hypokalemia - Potassium 2.6 on arrival. Repleted.  - Repeat Potassium 3.4 and 3.3. Goal > 4.0. Continue to replete.   Hypomagnesemia  - Magnesium 1.5 on arrival. Repleted.  - Repeat Magnesium has been ordered for tomorrow. Goal > 2.0.   CKD Stage II - Serum creatinine 1.22 on arrival. Baseline appears to be around 0.9 to 1.2. - Home HCTZ and Lisinopril on hold at this time. - Continue to monitor.  Anemia - Hemoglobin 10.1 on arrival.  - Iron levels  normal in 01/2018. - Patient had kidney stones in 10/2017  and had some hematuria with that but denies any since that time. No other abnormal bleeding. - Management per primary team.   Signed, Darreld Mclean, PA-C 03/17/2018, 4:02 PM  For questions or updates, please contact   Please consult [email or web address] for contact info under Cardiology/STEMI.

## 2018-03-18 ENCOUNTER — Telehealth: Payer: Self-pay | Admitting: Cardiovascular Disease

## 2018-03-18 DIAGNOSIS — I4891 Unspecified atrial fibrillation: Secondary | ICD-10-CM | POA: Diagnosis not present

## 2018-03-18 LAB — CBC
HCT: 27.9 % — ABNORMAL LOW (ref 36.0–46.0)
Hemoglobin: 9.6 g/dL — ABNORMAL LOW (ref 12.0–15.0)
MCH: 30.7 pg (ref 26.0–34.0)
MCHC: 34.4 g/dL (ref 30.0–36.0)
MCV: 89.1 fL (ref 80.0–100.0)
PLATELETS: 147 10*3/uL — AB (ref 150–400)
RBC: 3.13 MIL/uL — ABNORMAL LOW (ref 3.87–5.11)
RDW: 13.8 % (ref 11.5–15.5)
WBC: 4.7 10*3/uL (ref 4.0–10.5)
nRBC: 0 % (ref 0.0–0.2)

## 2018-03-18 LAB — COMPREHENSIVE METABOLIC PANEL
ALT: 12 U/L (ref 0–44)
AST: 16 U/L (ref 15–41)
Albumin: 3 g/dL — ABNORMAL LOW (ref 3.5–5.0)
Alkaline Phosphatase: 34 U/L — ABNORMAL LOW (ref 38–126)
Anion gap: 4 — ABNORMAL LOW (ref 5–15)
BUN: 12 mg/dL (ref 8–23)
CALCIUM: 8.2 mg/dL — AB (ref 8.9–10.3)
CO2: 23 mmol/L (ref 22–32)
Chloride: 114 mmol/L — ABNORMAL HIGH (ref 98–111)
Creatinine, Ser: 1.01 mg/dL — ABNORMAL HIGH (ref 0.44–1.00)
GFR calc Af Amer: >60 mL/min (ref >60)
GFR calc non Af Amer: 57 mL/min — ABNORMAL LOW (ref >60)
GLUCOSE: 98 mg/dL (ref 70–99)
Potassium: 3.7 mmol/L (ref 3.5–5.1)
Sodium: 141 mmol/L (ref 135–145)
Total Bilirubin: 1.1 mg/dL (ref 0.3–1.2)
Total Protein: 5.6 g/dL — ABNORMAL LOW (ref 6.5–8.1)

## 2018-03-18 LAB — T4, FREE: Free T4: 1.16 ng/dL (ref 0.82–1.77)

## 2018-03-18 LAB — MAGNESIUM: Magnesium: 2.1 mg/dL (ref 1.7–2.4)

## 2018-03-18 NOTE — Discharge Summary (Signed)
Physician Discharge Summary  Anna Marquez:[id number] DOB: 11/29/1950 DOA: 03/16/2018  PCP: [name], DO  Admit date: 03/16/2018 Discharge date: 03/18/2018  Admitted From: home    Disposition:  home  Recommendations for Outpatient Follow-up:  1. Follow up with PCP in 1-2 weeks 2. Please obtain BMP/CBC in one week 3. Please follow up on the following pending results:  Home Health:none  Equipment/Devices:none   Discharge Condition: Stable CODE STATUS: Full code Diet recommendation: Heart healthy  Brief/Interim Summary: Patient is 68 year old female with history of paroxysmal A. fib, hypertension, mitral valve prolapse who has been experiencing upper respiratory act symptoms for last few days, she had sick contacts with her grandson at home.  Patient went to her primary care physician and found to have in rapid A. fib.  Patient had chest tightness and palpitations.  Patient was in rapid A. fib in the ER, attempted DC cardioversion in the ER was unsuccessful.  She was started on Cardizem drip and was kept in the hospital.  Her nadolol was resumed.  Her potassium and magnesium was replaced.Patient is spontaneously converted to sinus rhythm.  Today she is asymptomatic.  She has resumed her nadolol and Eliquis.  Her upper respiratory tract infection symptoms are mostly improved.  Patient is going home in stable condition.  She will follow-up with cardiology as outpatient.  Advised over-the-counter cough medications.  She will also use albuterol inhaler as needed.  Discharge Diagnoses:  Principal Problem:   Atrial fibrillation with RVR ([place]) Active Problems:   Essential hypertension    Discharge Instructions  Discharge Instructions    Amb referral to [facility]   Complete by:  As directed    Call MD for:  difficulty breathing, headache or visual disturbances   Complete by:  As directed    Call MD for:  temperature >100.4   Complete by:  As directed    Diet - low sodium heart  healthy   Complete by:  As directed    Increase activity slowly   Complete by:  As directed      Allergies as of 03/18/2018      Reactions   Penicillins Hives, Shortness Of Breath   Has patient had a PCN reaction causing immediate rash, facial/tongue/throat swelling, SOB or lightheadedness with hypotension: yes Has patient had a PCN reaction causing severe rash involving mucus membranes or skin necrosis: no Has patient had a PCN reaction that required hospitalization: unknown Has patient had a PCN reaction occurring within the last 10 years: no If all of the above answers are "NO", then may proceed with Cephalosporin use.   Ibuprofen Other (See Comments)   Pt states allergy to NSAIDS is secondary to ulcers NOT anaphylaxis. Tolerates daily 325 ASA at home   Erythromycin Nausea And Vomiting, Other (See Comments)   REACTION: Stomach upset      Medication List    STOP taking these medications   hydrochlorothiazide 25 MG tablet Commonly known as:  HYDRODIURIL     TAKE these medications   albuterol 108 (90 Base) MCG/ACT inhaler Commonly known as:  PROVENTIL HFA;VENTOLIN HFA Inhale 1-2 puffs into the lungs every 6 (six) hours as needed for wheezing or shortness of breath.   apixaban 5 MG Tabs tablet Commonly known as:  ELIQUIS Take 1 tablet (5 mg total) by mouth 2 (two) times daily.   fluticasone 50 MCG/ACT nasal spray Commonly known as:  FLONASE USE ONE SPRAY IN EACH NOSTRIL DAILY. What changed:    how  much to take  how to take this  when to take this  additional instructions   ibuprofen 200 MG tablet Commonly known as:  ADVIL,MOTRIN Take 400 mg by mouth every 6 (six) hours as needed for mild pain.   lisinopril 10 MG tablet Commonly known as:  PRINIVIL,ZESTRIL Take 1 tablet (10 mg total) by mouth daily.   loratadine 10 MG tablet Commonly known as:  CLARITIN Take 10 mg by mouth daily.   nadolol 40 MG tablet Commonly known as:  CORGARD TAKE 1 TABLET BY MOUTH  TWICE DAILY. What changed:    how much to take  how to take this  when to take this  additional instructions   pantoprazole 40 MG tablet Commonly known as:  PROTONIX Take 1 tablet (40 mg total) by mouth daily.   TYLENOL PM EXTRA STRENGTH PO Take 2 tablets by mouth at bedtime as needed (pain).      Follow-up Information    [name], DO Follow up in 2 week(s).   Specialty:  Family Medicine Contact information: [place] [address] [phone]        Nahser, Wonda Cheng, MD Follow up.   Specialty:  Cardiology Why:  office scheduler will contact you to arrange a 7-14 days followup after discharge, please give [place] a call if you do not hear from our scheduler in 3 business days.  Contact information: [place] [address] [place] [address] [phone]          Allergies  Allergen Reactions  . Penicillins Hives and Shortness Of Breath    Has patient had a PCN reaction causing immediate rash, facial/tongue/throat swelling, SOB or lightheadedness with hypotension: yes Has patient had a PCN reaction causing severe rash involving mucus membranes or skin necrosis: no Has patient had a PCN reaction that required hospitalization: unknown Has patient had a PCN reaction occurring within the last 10 years: no If all of the above answers are "NO", then may proceed with Cephalosporin use.   . Ibuprofen Other (See Comments)    Pt states allergy to NSAIDS is secondary to ulcers NOT anaphylaxis. Tolerates daily 325 ASA at home  . Erythromycin Nausea And Vomiting and Other (See Comments)    REACTION: Stomach upset    Consultations:  Cardiology   Procedures/Studies: Dg Chest 2 View  Result Date: 03/16/2018 CLINICAL DATA:  68 y/o F; one week of cough. Chest pain and shortness of breath this morning. Atrial fibrillation. History of mitral valve prolapse. EXAM: CHEST - 2 VIEW COMPARISON:  01/31/2017 chest radiograph FINDINGS: Stable cardiac  silhouette within normal limits given projection and technique. Stable moderate hiatal hernia. Pulmonary venous hypertension. No consolidation, effusion, or pneumothorax. Bones are unremarkable. IMPRESSION: Pulmonary venous hypertension. Stable moderate hiatal hernia. Electronically Signed   By: Kristine Garbe M.D.   On: 03/16/2018 17:50   Mm 3d Screen Breast Bilateral  Result Date: 03/03/2018 CLINICAL DATA:  Screening. EXAM: DIGITAL SCREENING BILATERAL MAMMOGRAM WITH TOMO AND CAD COMPARISON:  Previous exam(s). ACR Breast Density Category b: There are scattered areas of fibroglandular density. FINDINGS: There are no findings suspicious for malignancy. Images were processed with CAD. IMPRESSION: No mammographic evidence of malignancy. A result letter of this screening mammogram will be mailed directly to the patient. RECOMMENDATION: Screening mammogram in one year. (Code:SM-B-01Y) BI-RADS CATEGORY  1: Negative. Electronically Signed   By: Dorise Bullion III M.D   On: 03/03/2018 12:37      Subjective: Patient was seen  the day of discharge.  She is up and walking in the hallway.  Denies any chest discomfort or palpitations.  Eager to go home.  Discharge Exam: Vitals:   03/17/18 2108 03/18/18 0638  BP: 100/62 124/85  Pulse: 75 78  Resp: 18   Temp: (!) 97.4 F (36.3 C) 97.8 F (36.6 C)  SpO2: 96% 92%   Vitals:   03/17/18 1652 03/17/18 2108 03/18/18 0638 03/18/18 0640  BP: 97/74 100/62 124/85   Pulse: 78 75 78   Resp:  18    Temp: 98.1 F (36.7 C) (!) 97.4 F (36.3 C) 97.8 F (36.6 C)   TempSrc: Oral     SpO2: 97% 96% 92%   Weight: 81.5 kg   82.6 kg  Height: 5\' 4"  (1.626 m)       General: Pt is alert, awake, not in acute distress Cardiovascular: RRR, S1/S2 +, no rubs, no gallops Respiratory: CTA bilaterally, no wheezing, no rhonchi Abdominal: Soft, NT, ND, bowel sounds + Extremities: no edema, no cyanosis Up and walking in the hallway.   The results of significant  diagnostics from this hospitalization (including imaging, microbiology, ancillary and laboratory) are listed below for reference.     Microbiology: No results found for this or any previous visit (from the past 240 hour(s)).   Labs: BNP (last 3 results) Recent Labs    03/16/18 1641  BNP 867.6*   Basic Metabolic Panel: Recent Labs  Lab 03/16/18 1641 03/16/18 2030 03/17/18 0544 03/18/18 0415  NA 139 141 138 141  K 2.6* 3.4* 3.3* 3.7  CL 111  --  108 114*  CO2 20*  --  20* 23  GLUCOSE 91  --  93 98  BUN 13  --  13 12  CREATININE 1.22* 1.00 1.20* 1.01*  CALCIUM 7.8*  --  8.6* 8.2*  MG 1.5*  --   --  2.1   Liver Function Tests: Recent Labs  Lab 03/17/18 0544 03/18/18 0415  AST 15 16  ALT 14 12  ALKPHOS 33* 34*  BILITOT 1.5* 1.1  PROT 6.0* 5.6*  ALBUMIN 3.2* 3.0*   No results for input(s): LIPASE, AMYLASE in the last 168 hours. No results for input(s): AMMONIA in the last 168 hours. CBC: Recent Labs  Lab 03/16/18 1641 03/16/18 2030 03/17/18 0347 03/17/18 0544 03/18/18 0415  WBC 6.8  --  8.7 6.8 4.7  NEUTROABS 4.0  --   --  3.6  --   HGB 10.1* 8.8* 11.4* 10.6* 9.6*  HCT 29.1* 26.0* 33.8* 32.1* 27.9*  MCV 88.2  --  88.3 87.7 89.1  PLT 168  --  201 172 147*   Cardiac Enzymes: Recent Labs  Lab 03/17/18 0347 03/17/18 0544 03/17/18 1206  TROPONINI <0.03 <0.03 <0.03   BNP: Invalid input(s): POCBNP CBG: Recent Labs  Lab 03/16/18 2231  GLUCAP 88   D-Dimer No results for input(s): DDIMER in the last 72 hours. Hgb A1c No results for input(s): HGBA1C in the last 72 hours. Lipid Profile No results for input(s): CHOL, HDL, LDLCALC, TRIG, CHOLHDL, LDLDIRECT in the last 72 hours. Thyroid function studies Recent Labs    03/17/18 0347  TSH 0.409   Anemia work up Recent Labs    03/17/18 0347  VITAMINB12 292  FOLATE 36.9  FERRITIN 61  TIBC 328  IRON 75  RETICCTPCT 1.8   Urinalysis    Component Value Date/Time   COLORURINE RED (A) 10/21/2017  0635   APPEARANCEUR CLOUDY (A) 10/21/2017 7209  LABSPEC 1.017 10/21/2017 0635   PHURINE 6.0 10/21/2017 0635   GLUCOSEU NEGATIVE 10/21/2017 0635   GLUCOSEU NEGATIVE 10/16/2009 0756   HGBUR LARGE (A) 10/21/2017 0635   HGBUR large 03/21/2009 1424   BILIRUBINUR Negative 10/28/2017 1736   KETONESUR NEGATIVE 10/21/2017 0635   PROTEINUR Negative 10/28/2017 1736   PROTEINUR 100 (A) 10/21/2017 0635   UROBILINOGEN 0.2 10/28/2017 1736   UROBILINOGEN 0.2 06/28/2014 1700   NITRITE Negative 10/28/2017 1736   NITRITE NEGATIVE 10/21/2017 0635   LEUKOCYTESUR Negative 10/28/2017 1736   Sepsis Labs Invalid input(s): PROCALCITONIN,  WBC,  LACTICIDVEN Microbiology No results found for this or any previous visit (from the past 240 hour(s)).   Time coordinating discharge: 25 minutes  SIGNED:   Barb Merino, MD  [facility] 03/18/2018, 11:48 AM Pager [phone]  If 7PM-7AM, please contact night-coverage [email or web address] Password [email or web address]

## 2018-03-18 NOTE — Telephone Encounter (Signed)
New Message   Pt has TOC Appt 3/30 at 9:00am With Daune Perch

## 2018-03-18 NOTE — Telephone Encounter (Signed)
Follow up    Pt already scheduled for earlier visit  Please disregard this encounter note  Thank you

## 2018-03-18 NOTE — Telephone Encounter (Signed)
Pt discharged from the hospital today.  Nursing to call the pt tomorrow for TCM follow-up.

## 2018-03-18 NOTE — Plan of Care (Signed)
  Problem: Education: Goal: Knowledge of General Education information will improve Description Including pain rating scale, medication(s)/side effects and non-pharmacologic comfort measures Outcome: Completed/Met

## 2018-03-18 NOTE — Progress Notes (Signed)
Patient being discharged. Fully dressed and packed. Ready to go.   Continue nadolol and eliquis. Hold HCTZ and lisinopril. I have sent staff message to our scheduler to contact her and arrange 7-14 day TOC followup.

## 2018-03-19 ENCOUNTER — Telehealth: Payer: Self-pay | Admitting: Family Medicine

## 2018-03-19 LAB — T3, FREE: T3, Free: 2.8 pg/mL (ref 2.0–4.4)

## 2018-03-19 NOTE — Telephone Encounter (Signed)
Copied from [place]. Topic: Quick Communication - See Telephone Encounter >> Mar 19, 2018  6:07 PM Loma [place] wrote: CRM for notification. See Telephone encounter for: 03/19/18. Patient just saw Dr Zigmund Daniel on 3/9, was sent to ER from office, A Fib is back in tac but now having real issues breathing and wanted to come back in tomorrow to be treated for bronchitis. Was advised to have DR Zigmund Daniel or nurse return call vs her coming back in. Her contact number after 8:00 am will be [phone].

## 2018-03-20 ENCOUNTER — Telehealth: Payer: Self-pay | Admitting: Family Medicine

## 2018-03-20 NOTE — Telephone Encounter (Signed)
Does that message mean A. Fib back in tachycardia?  If so and having issues with breathing would recommend f/u in ED

## 2018-03-20 NOTE — Telephone Encounter (Signed)
As long as heart rate is not elevated again with her shortness of breath it would be ok to see her here at the office.

## 2018-03-20 NOTE — Telephone Encounter (Signed)
Spoke with the, pt stated she feels a lot better today, BP is back to normal, coughing and congestion is better. Pt scheduled Hospital follow up with Dr. Zigmund Daniel on 03/25/2018.

## 2018-03-20 NOTE — Telephone Encounter (Signed)
Transition Care Management Follow-up Telephone Call   Date discharged?03/18/2018   How have you been since you were released from the hospital? Pt report feeling much better today since hospital discharge.    Do you understand why you were in the hospital? yes   Do you understand the discharge instructions? yes   Where were you discharged to? Home   Items Reviewed:  Medications reviewed: yes  Allergies reviewed: yes  Dietary changes reviewed: yes  Referrals reviewed: yes   Functional Questionnaire:   Activities of Daily Living (ADLs):   She states they are independent in the following: ambulation States they require assistance with the following: None   Any transportation issues/concerns?: no   Any patient concerns? no   Confirmed importance and date/time of follow-up visits scheduled yes  Provider Appointment booked with Dr. [name] 03/25/2018 at 10:30 am  Confirmed with patient if condition begins to worsen call PCP or go to the ER.  Patient was given the office number and encouraged to call back with question or concerns.  : yes

## 2018-03-25 ENCOUNTER — Encounter: Payer: Self-pay | Admitting: Family Medicine

## 2018-03-25 ENCOUNTER — Ambulatory Visit (INDEPENDENT_AMBULATORY_CARE_PROVIDER_SITE_OTHER): Payer: PPO | Admitting: Family Medicine

## 2018-03-25 ENCOUNTER — Other Ambulatory Visit: Payer: Self-pay

## 2018-03-25 DIAGNOSIS — D649 Anemia, unspecified: Secondary | ICD-10-CM

## 2018-03-25 DIAGNOSIS — I4891 Unspecified atrial fibrillation: Secondary | ICD-10-CM

## 2018-03-25 DIAGNOSIS — E876 Hypokalemia: Secondary | ICD-10-CM | POA: Diagnosis not present

## 2018-03-25 LAB — BASIC METABOLIC PANEL
BUN: 17 mg/dL (ref 6–23)
CALCIUM: 10 mg/dL (ref 8.4–10.5)
CO2: 28 mEq/L (ref 19–32)
Chloride: 105 mEq/L (ref 96–112)
Creatinine, Ser: 1.06 mg/dL (ref 0.40–1.20)
GFR: 51.38 mL/min — ABNORMAL LOW (ref >60.00)
Glucose, Bld: 94 mg/dL (ref 70–99)
Potassium: 4.4 mEq/L (ref 3.5–5.1)
Sodium: 140 mEq/L (ref 135–145)

## 2018-03-25 LAB — CBC
HCT: 34.3 % — ABNORMAL LOW (ref 36.0–46.0)
Hemoglobin: 11.7 g/dL — ABNORMAL LOW (ref 12.0–15.0)
MCHC: 34.2 g/dL (ref 30.0–36.0)
MCV: 88.9 fl (ref 78.0–100.0)
Platelets: 230 10*3/uL (ref 150.0–400.0)
RBC: 3.85 Mil/uL — ABNORMAL LOW (ref 3.87–5.11)
RDW: 14.4 % (ref 11.5–15.5)
WBC: 5.2 10*3/uL (ref 4.0–10.5)

## 2018-03-25 LAB — MAGNESIUM: Magnesium: 1.9 mg/dL (ref 1.5–2.5)

## 2018-03-25 NOTE — Progress Notes (Signed)
Anna Marquez - 68 y.o. female MRN [id number]  Date of birth: [birth year]  Subjective Chief Complaint  Patient presents with  . Hospitalization Follow-up    * repeat CBC/BMP,     HPI Anna Marquez is a 68 y.o. female here today for hospital f/u.  She was seen in my clinic for bronchitis with shortness of breath on 03/16/2018 and found to be in A. Fib with RVR.  She was transported to ED vis EMS.  Metoprolol was given in route without rate improvement.  Cardioversion was attempted in ED but failed and she was admitted to the hospital.  She was found to have low potassium and magnesium which were replaced.   Her HCTZ has been stopped.  He was placed on a diltiazem drip and spontaneously converted back to NSR on 03/18/2018.  She was subsequently continued on her home nadolol with f/u with cardiology and PCP.   Her respiratory symptoms have improved at this point and she feels well.  She needs to have electrolytes and CBC checked again.  She denies palpitations, shortness of breath, fever, chest pain.   ROS:  A comprehensive ROS was completed and negative except as noted per HPI   Allergies  Allergen Reactions  . Penicillins Hives and Shortness Of Breath    Has patient had a PCN reaction causing immediate rash, facial/tongue/throat swelling, SOB or lightheadedness with hypotension: yes Has patient had a PCN reaction causing severe rash involving mucus membranes or skin necrosis: no Has patient had a PCN reaction that required hospitalization: unknown Has patient had a PCN reaction occurring within the last 10 years: no If all of the above answers are "NO", then may proceed with Cephalosporin use.   . Ibuprofen Other (See Comments)    Pt states allergy to NSAIDS is secondary to ulcers NOT anaphylaxis. Tolerates daily 325 ASA at home  . Erythromycin Nausea And Vomiting and Other (See Comments)    REACTION: Stomach upset    Past Medical History:  Diagnosis Date  . Adenomatous colon polyp 2003   . Cholelithiasis   . Diverticulosis of colon   . Gastric polyps   . GERD (gastroesophageal reflux disease)   . Helicobacter pylori (H. pylori)   . Hiatal hernia   . Hypertension   . Mitral valve prolapse   . Nephrolithiasis   . Osteoarthritis   . Osteopenia 03/2017   T score -1.5 FRAX 8.9% / 0.9%.  Stable from prior DEXA 2016  . TGA (transient global amnesia)     Past Surgical History:  Procedure Laterality Date  . ANTERIOR AND POSTERIOR REPAIR N/A 06/14/2014   Procedure: ANTERIOR (CYSTOCELE) AND POSTERIOR REPAIR (RECTOCELE);  Surgeon: Anastasio Auerbach, MD;  Location: [facility];  Service: Gynecology;  Laterality: N/A;  . CHOLECYSTECTOMY    . kidney stone removal     x2  . VAGINAL HYSTERECTOMY N/A 06/14/2014   Procedure: HYSTERECTOMY VAGINAL;  Surgeon: Anastasio Auerbach, MD;  Location: [facility];  Service: Gynecology;  Laterality: N/A;    Social History   Socioeconomic History  . Marital status: Divorced    Spouse name: Not on file  . Number of children: Not on file  . Years of education: Not on file  . Highest education level: Not on file  Occupational History  . Not on file  Social Needs  . Financial resource strain: Not on file  . Food insecurity:    Worry: Not on file    Inability: Not on file  .  Transportation needs:    Medical: Not on file    Non-medical: Not on file  Tobacco Use  . Smoking status: Never Smoker  . Smokeless tobacco: Never Used  Substance and Sexual Activity  . Alcohol use: Yes    Alcohol/week: 0.0 standard drinks    Comment: Occas  . Drug use: No  . Sexual activity: Yes    Birth control/protection: Post-menopausal    Comment: 1st intercourse 68 yo-Fewer than 5 partners  Lifestyle  . Physical activity:    Days per week: Not on file    Minutes per session: Not on file  . Stress: Not on file  Relationships  . Social connections:    Talks on phone: Not on file    Gets together: Not on file    Attends religious service: Not on file    Active  member of club or organization: Not on file    Attends meetings of clubs or organizations: Not on file    Relationship status: Not on file  Other Topics Concern  . Not on file  Social History Narrative  . Not on file    Family History  Problem Relation Age of Onset  . Cancer Father        prostate  . Breast cancer Paternal Aunt 94  . Diabetes Maternal Grandmother   . Heart disease Maternal Grandmother   . Heart disease Maternal Grandfather   . Melanoma Paternal Aunt   . Neuropathy Mother   . Congestive Heart Failure Mother     Health Maintenance  Topic Date Due  . Hepatitis C Screening  [birth year]  . MAMMOGRAM  03/03/2020  . COLONOSCOPY  03/13/2023  . TETANUS/TDAP  10/29/2027  . INFLUENZA VACCINE  Completed  . DEXA SCAN  Completed  . PNA vac Low Risk Adult  Completed    ----------------------------------------------------------------------------------------------------------------------------------------------------------------------------------------------------------------- Physical Exam BP 118/70   Pulse 77   Temp 98 F (36.7 C) (Oral)   Ht 5\' 4"  (1.626 m)   Wt 175 lb 6 oz (79.5 kg)   SpO2 96%   BMI 30.10 kg/m   Physical Exam Constitutional:      Appearance: Normal appearance.  HENT:     Head: Normocephalic and atraumatic.     Mouth/Throat:     Mouth: Mucous membranes are moist.  Eyes:     General: No scleral icterus. Neck:     Musculoskeletal: Neck supple.  Cardiovascular:     Rate and Rhythm: Normal rate and regular rhythm.  Pulmonary:     Effort: Pulmonary effort is normal.     Breath sounds: Normal breath sounds.  Lymphadenopathy:     Cervical: No cervical adenopathy.  Skin:    General: Skin is warm and dry.  Neurological:     General: No focal deficit present.     Mental Status: She is alert.  Psychiatric:        Mood and Affect: Mood normal.        Behavior: Behavior normal.      ------------------------------------------------------------------------------------------------------------------------------------------------------------------------------------------------------------------- Assessment and Plan  Atrial fibrillation with RVR ([facility]) -RRR at this time, continue nadolol and apixaban.  -Has f/u with cardiology.   Hypokalemia Recheck electrolytes/magnesium today.   Anemia -Recheck CBC

## 2018-03-25 NOTE — Assessment & Plan Note (Signed)
Recheck CBC. 

## 2018-03-25 NOTE — Progress Notes (Signed)
Established Patient Office Visit  Subjective:  Patient ID: Anna Marquez, female    DOB: [birth year]  Age: 68 y.o. MRN: [id number]  CC: No chief complaint on file.   HPI [name] present  Past Medical History:  Diagnosis Date  . Adenomatous colon polyp 2003  . Cholelithiasis   . Diverticulosis of colon   . Gastric polyps   . GERD (gastroesophageal reflux disease)   . Helicobacter pylori (H. pylori)   . Hiatal hernia   . Hypertension   . Mitral valve prolapse   . Nephrolithiasis   . Osteoarthritis   . Osteopenia 03/2017   T score -1.5 FRAX 8.9% / 0.9%.  Stable from prior DEXA 2016  . TGA (transient global amnesia)     Past Surgical History:  Procedure Laterality Date  . ANTERIOR AND POSTERIOR REPAIR N/A 06/14/2014   Procedure: ANTERIOR (CYSTOCELE) AND POSTERIOR REPAIR (RECTOCELE);  Surgeon: Anastasio Auerbach, MD;  Location: [facility];  Service: Gynecology;  Laterality: N/A;  . CHOLECYSTECTOMY    . kidney stone removal     x2  . VAGINAL HYSTERECTOMY N/A 06/14/2014   Procedure: HYSTERECTOMY VAGINAL;  Surgeon: Anastasio Auerbach, MD;  Location: [facility];  Service: Gynecology;  Laterality: N/A;    Family History  Problem Relation Age of Onset  . Cancer Father        prostate  . Breast cancer Paternal Aunt 15  . Diabetes Maternal Grandmother   . Heart disease Maternal Grandmother   . Heart disease Maternal Grandfather   . Melanoma Paternal Aunt   . Neuropathy Mother   . Congestive Heart Failure Mother     Social History   Socioeconomic History  . Marital status: Divorced    Spouse name: Not on file  . Number of children: Not on file  . Years of education: Not on file  . Highest education level: Not on file  Occupational History  . Not on file  Social Needs  . Financial resource strain: Not on file  . Food insecurity:    Worry: Not on file    Inability: Not on file  . Transportation needs:    Medical: Not on file    Non-medical: Not on file  Tobacco Use   . Smoking status: Never Smoker  . Smokeless tobacco: Never Used  Substance and Sexual Activity  . Alcohol use: Yes    Alcohol/week: 0.0 standard drinks    Comment: Occas  . Drug use: No  . Sexual activity: Yes    Birth control/protection: Post-menopausal    Comment: 1st intercourse 68 yo-Fewer than 5 partners  Lifestyle  . Physical activity:    Days per week: Not on file    Minutes per session: Not on file  . Stress: Not on file  Relationships  . Social connections:    Talks on phone: Not on file    Gets together: Not on file    Attends religious service: Not on file    Active member of club or organization: Not on file    Attends meetings of clubs or organizations: Not on file    Relationship status: Not on file  . Intimate partner violence:    Fear of current or ex partner: Not on file    Emotionally abused: Not on file    Physically abused: Not on file    Forced sexual activity: Not on file  Other Topics Concern  . Not on file  Social History Narrative  .  Not on file    Outpatient Medications Prior to Visit  Medication Sig Dispense Refill  . albuterol (PROVENTIL HFA;VENTOLIN HFA) 108 (90 Base) MCG/ACT inhaler Inhale 1-2 puffs into the lungs every 6 (six) hours as needed for wheezing or shortness of breath. 1 Inhaler 0  . apixaban (ELIQUIS) 5 MG TABS tablet Take 1 tablet (5 mg total) by mouth 2 (two) times daily. 60 tablet 11  . diphenhydrAMINE-APAP, sleep, (TYLENOL PM EXTRA STRENGTH PO) Take 2 tablets by mouth at bedtime as needed (pain).    . fluticasone (FLONASE) 50 MCG/ACT nasal spray USE ONE SPRAY IN EACH NOSTRIL DAILY. (Patient taking differently: Place 1 spray into both nostrils daily. ) 32 g 11  . ibuprofen (ADVIL,MOTRIN) 200 MG tablet Take 400 mg by mouth every 6 (six) hours as needed for mild pain.    [name] lisinopril (PRINIVIL,ZESTRIL) 10 MG tablet Take 1 tablet (10 mg total) by mouth daily. 100 tablet 3  . loratadine (CLARITIN) 10 MG tablet Take 10 mg by mouth  daily.    . nadolol (CORGARD) 40 MG tablet TAKE 1 TABLET BY MOUTH TWICE DAILY. (Patient taking differently: Take 40 mg by mouth 2 (two) times daily. ) 180 tablet 4  . pantoprazole (PROTONIX) 40 MG tablet Take 1 tablet (40 mg total) by mouth daily. 90 tablet 4   No facility-administered medications prior to visit.     Allergies  Allergen Reactions  . Penicillins Hives and Shortness Of Breath    Has patient had a PCN reaction causing immediate rash, facial/tongue/throat swelling, SOB or lightheadedness with hypotension: yes Has patient had a PCN reaction causing severe rash involving mucus membranes or skin necrosis: no Has patient had a PCN reaction that required hospitalization: unknown Has patient had a PCN reaction occurring within the last 10 years: no If all of the above answers are "NO", then may proceed with Cephalosporin use.   . Ibuprofen Other (See Comments)    Pt states allergy to NSAIDS is secondary to ulcers NOT anaphylaxis. Tolerates daily 325 ASA at home  . Erythromycin Nausea And Vomiting and Other (See Comments)    REACTION: Stomach upset    ROS Review of Systems    Objective:    Physical Exam  There were no vitals taken for this visit. Wt Readings from Last 3 Encounters:  03/18/18 182 lb 3.2 oz (82.6 kg)  03/16/18 174 lb 9.6 oz (79.2 kg)  03/04/18 175 lb (79.4 kg)   BP Readings from Last 3 Encounters:  03/18/18 124/85  03/04/18 122/82  02/04/18 110/76   Guideline developer:  UpToDate (see UpToDate for funding source) Date Released: June 2014  Health Maintenance Due  Topic Date Due  . Hepatitis C Screening  07-Apr-1950    There are no preventive care reminders to display for this patient.  Lab Results  Component Value Date   TSH 0.409 03/17/2018   Lab Results  Component Value Date   WBC 4.7 03/18/2018   HGB 9.6 (L) 03/18/2018   HCT 27.9 (L) 03/18/2018   MCV 89.1 03/18/2018   PLT 147 (L) 03/18/2018   Lab Results  Component Value Date   NA  141 03/18/2018   K 3.7 03/18/2018   CO2 23 03/18/2018   GLUCOSE 98 03/18/2018   BUN 12 03/18/2018   CREATININE 1.01 (H) 03/18/2018   BILITOT 1.1 03/18/2018   ALKPHOS 34 (L) 03/18/2018   AST 16 03/18/2018   ALT 12 03/18/2018   PROT 5.6 (L) 03/18/2018   ALBUMIN  3.0 (L) 03/18/2018   CALCIUM 8.2 (L) 03/18/2018   ANIONGAP 4 (L) 03/18/2018   GFR 43.98 (L) 10/28/2017   Lab Results  Component Value Date   CHOL 194 10/28/2017   Lab Results  Component Value Date   HDL 57.10 10/28/2017   Lab Results  Component Value Date   LDLCALC 103 (H) 10/28/2017   Lab Results  Component Value Date   TRIG 170.0 (H) 10/28/2017   Lab Results  Component Value Date   CHOLHDL 3 10/28/2017   Lab Results  Component Value Date   HGBA1C 5.1 08/21/2015      Assessment & Plan:   Problem List Items Addressed This Visit    None      No orders of the defined types were placed in this encounter.   Follow-up: No follow-ups on file.

## 2018-03-25 NOTE — Assessment & Plan Note (Signed)
Recheck electrolytes/magnesium today.

## 2018-03-25 NOTE — Assessment & Plan Note (Signed)
-  RRR at this time, continue nadolol and apixaban.  -Has f/u with cardiology.

## 2018-03-25 NOTE — Patient Instructions (Signed)
Glad you are doing well! Continue inhaler as needed We'll be in touch with lab results and recommendations.

## 2018-03-27 ENCOUNTER — Encounter: Payer: Self-pay | Admitting: Family Medicine

## 2018-03-30 ENCOUNTER — Encounter: Payer: PPO | Admitting: Gynecology

## 2018-03-31 ENCOUNTER — Encounter: Payer: Self-pay | Admitting: Physical Therapy

## 2018-03-31 ENCOUNTER — Telehealth: Payer: Self-pay | Admitting: Physician Assistant

## 2018-03-31 NOTE — Therapy (Signed)
[facility] [address] [place], [place], [address] Phone: [phone]   Fax:  [phone]  Patient Details  Name: Anna Marquez MRN: [id number] Date of Birth: [birth year] Referring Provider:  No ref. provider found  Encounter Date: 03/31/2018  PHYSICAL THERAPY DISCHARGE SUMMARY  Visits from Start of Care: 5 (last visit 09/04/2017)  Current functional level related to goals / functional outcomes: PT Long Term Goals - 08/29/17 0950      PT LONG TERM GOAL #1   Title  Pt will be independent with HEP for improved pain and improved functional mobility tolerance.  TARGET 09/05/17    Time  5    Period  Weeks    Status  On-going      PT LONG TERM GOAL #2   Title  Pt will verbalize understanding of proper posture/body mechanics with lifting, ADLs, for decreased pain with ADLs and functional mobility.     Time  5    Period  Weeks    Status  On-going      PT LONG TERM GOAL #3   Title  Pt will improve 5x sit<>stand to less than or equal to 12 seconds for improved transfer efficiency and safety.    Time  5    Period  Weeks    Status  On-going      PT LONG TERM GOAL #4   Title  Pt will improve Oswestry Questionnaire to less than or equal to 10% for improved pain free standing, walking, sleeping activities.    Time  5    Period  Weeks    Status  On-going      PT LONG TERM GOAL #5   Title  6MWT to be assessed, with goal to be written as appropriate.    Baseline  08/28/17: 1261 feet with no AD, no rest breaks. Lower that pts age related norm distance of 1765 feet. Primary PT to set goal as indicated.     Status  Achieved      LTGs not able to be fully assessed, as pt did not return after 09/04/2017 visit.   Remaining deficits: Back pain   Education / Equipment: Educated in [employer]  Plan: Patient agrees to discharge.  Patient goals were not met. Patient is being discharged due to not returning since the last visit.  ?????        Quest Tavenner W. 03/31/2018, 8:34 AM Mady Haagensen, PT 03/31/18 8:36 AM Phone: [phone] Fax: [facility] [address] [place], [place], [address] Phone: [phone]   Fax:  [phone]

## 2018-03-31 NOTE — Telephone Encounter (Signed)
L/m for patient to call back.  When she calls, please ask her:  1. Would she be willing to do a Telehealth visit on Friday for her appt?  2. Does she have access to a computer with a webcam/camera?  3. Does she have access to an [employer] (which version)? [employer] device? FitBit? 4. Does she have access to a BP cuff? 5. Can she take her pulse?  Let me know her answers before changing to a telehealth visit.    Richardson Dopp, PA-C    03/31/2018 9:51 AM

## 2018-03-31 NOTE — Therapy (Signed)
[facility] [address] [place], [place], [address] Phone: [phone]   Fax:  [phone]  Patient Details  Name: Anna Marquez MRN: [id number] Date of Birth: 15-Jun-1950 Referring Provider:  No ref. provider found  Encounter Date: 03/31/2018  OCCUPATIONAL THERAPY DISCHARGE SUMMARY  Visits from Start of Care: 5  Current functional level related to goals / functional outcomes:  Pt has met all STG's. Pt met no LTG's due to not returning after the 5th visit on 09/04/17    Remaining deficits: Refer to last note on 09/04/17   Education / Equipment: HEP's, safety considerations re: lack of sensation, kinesiotape application  Plan: Patient agrees to discharge.  Patient goals were partially met. Patient is being discharged due to not returning since the last visit.  ?????        Carey Bullocks, OTR/L 03/31/2018, 1:52 PM  [facility] [address] [place], [place], [address] Phone: [phone]   Fax:  [phone]

## 2018-04-01 ENCOUNTER — Ambulatory Visit: Payer: PPO | Admitting: Family Medicine

## 2018-04-01 ENCOUNTER — Telehealth: Payer: Self-pay | Admitting: *Deleted

## 2018-04-01 NOTE — Telephone Encounter (Signed)
Cardiac Questionnaire:    A YES to any of these questions would result in the appointment being kept. *If all the answers to these questions are NO, we should indicate that given the current situation regarding the worldwide coronarvirus pandemic, at the recommendation of the CDC, we are looking to limit gatherings in our waiting area, and thus will reschedule their appointment beyond four weeks from today.   _____________   [id number] Pre-Screening Questions:   Do you currently have a fever?  (yes = cancel and refer to pcp for e-visit)  Have you recently travelled on a cruise, internationally, or to [place], [place], [place], [place], [place], or [place], [place] [employer]) ? NO  (yes = cancel, stay home, monitor symptoms, and contact pcp or initiate e-visit if symptoms develop)  Have you been in contact with someone that is currently pending confirmation of Covid19 testing or has been confirmed to have the [place] virus?   NO   (yes = cancel, stay home, away from tested individual, monitor symptoms, and contact pcp or initiate e-visit if symptoms develop)  Are you currently experiencing fatigue or cough?  NO  (yes = pt should be prepared to have a mask placed at the time of their visit)    TELEPHONE CALL NOTE  MAYTHE DERAMO has been deemed a candidate for a follow-up tele-health visit to limit community exposure during the Covid-19 pandemic. I spoke with the patient via phone to ensure availability of phone/video source, confirm preferred email & phone number, discuss instructions and expectations, and review consent.   I reminded INAYA GILLHAM to be prepared with any vital sign and/or heart rhythm information that could potentially be obtained via home monitoring, at the time of her visit.  Finally, I reminded CORRINA STEFFENSEN to expect an e-mail containing a link for their video-based visit approximately 15 minutes before her visit, or alternatively, a phone call at the time of her visit if her visit is  planned to be a phone encounter.  Did the patient verbally consent to treatment as below?  Yes   Claude Manges, [place] 04/01/2018 1:06 PM  [facility] FOR TELE-HEALTH VISIT - PLEASE REVIEW  I hereby voluntarily request, consent and authorize [facility] and its employed or contracted physicians, physician assistants, nurse practitioners or other licensed health care professionals (the Practitioner), to provide me with telemedicine health care services (the Services") as deemed necessary by the treating Practitioner. I acknowledge and consent to receive the Services by the Practitioner via telemedicine. I understand that the telemedicine visit will involve communicating with the Practitioner through live audiovisual communication technology and the disclosure of certain medical information by electronic transmission. I acknowledge that I have been given the opportunity to request an in-person assessment or other available alternative prior to the telemedicine visit and am voluntarily participating in the telemedicine visit.  I understand that I have the right to withhold or withdraw my consent to the use of telemedicine in the course of my care at any time, without affecting my right to future care or treatment, and that the Practitioner or I may terminate the telemedicine visit at any time. I understand that I have the right to inspect all information obtained and/or recorded in the course of the telemedicine visit and may receive copies of available information for a reasonable fee.  I understand that some of the potential risks of receiving the Services via telemedicine include:   Delay or interruption in medical evaluation due to  technological equipment failure or disruption;  Information transmitted may not be sufficient (e.g. poor resolution of images) to allow for appropriate medical decision making by the Practitioner; and/or   In rare instances, security protocols could fail, causing a  breach of personal health information.  Furthermore, I acknowledge that it is my responsibility to provide information about my medical history, conditions and care that is complete and accurate to the best of my ability. I acknowledge that Practitioner's advice, recommendations, and/or decision may be based on factors not within their control, such as incomplete or inaccurate data provided by me or distortions of diagnostic images or specimens that may result from electronic transmissions. I understand that the practice of medicine is not an exact science and that Practitioner makes no warranties or guarantees regarding treatment outcomes. I acknowledge that I will receive a copy of this consent concurrently upon execution via email to the email address I last provided but may also request a printed copy by calling the office of [facility].    I understand that my insurance will be billed for this visit.   I have read or had this consent read to me.  I understand the contents of this consent, which adequately explains the benefits and risks of the Services being provided via telemedicine.   I have been provided ample opportunity to ask questions regarding this consent and the Services and have had my questions answered to my satisfaction.  I give my informed consent for the services to be provided through the use of telemedicine in my medical care  By participating in this telemedicine visit I agree to the above.             [name]

## 2018-04-02 NOTE — Progress Notes (Signed)
Virtual Visit via Telephone Note    Evaluation Performed:  Follow-up visit  This visit type was conducted due to national recommendations for restrictions regarding the COVID-19 Pandemic (e.g. social distancing).  This format is felt to be most appropriate for this patient at this time.  All issues noted in this document were discussed and addressed.  No physical exam was performed (except for noted visual exam findings with Video Visits).  Please refer to the patient's chart (MyChart message for video visits and phone note for telephone visits) for the patient's consent to telehealth for [facility].  Date:  04/02/2018   ID:  Anna Marquez, DOB 10-15-1950, MRN [id number]  Patient Location:   Home   Provider location:   Office  PCP:  [name], DO  Cardiologist:  Mertie Moores, MD   Electrophysiologist:  None   Chief Complaint:  Hospital follow up - admitted with AFib with RVR   History of Present Illness:    Anna Marquez is a 68 y.o. female who presents via audio/video conferencing for a telehealth visit today.  She has paroxysmal atrial fibrillation, hypertension, MVP, asthma, GERD.  She was last seen by Dr. Acie Fredrickson in 05/2017.  She was started on Apixaban at that time.  She was admitted 3/9-3/11 with atrial fibrillation with RVR in the setting of a viral URI.  DCCV was attempted in the ED but this was unsuccessful.  She was hypokalemic and hypomagnesemic.  She was placed back on her beta-blocker and her potassium and magnesium were replaced.  She converted spontaneously to normal sinus rhythm.  Today, she notes she is doing well.  She can usually tell when she is in atrial fibrillation.  She has not had any palpitations.  She has not had chest pain, syncope, paroxysmal nocturnal dyspnea, bleeding. She has some occasional swelling in her legs/feet.  She has not had any significant shortness of breath other than what she experiences with asthma.  She has had a lot of allergies  recently.    CHADS2-VASc=3 (female, age x 1, HTN).   The patient does not symptoms concerning for COVID-19 infection (fever, chills, cough, or new shortness of breath).    Prior CV studies:   The following studies were reviewed today:  Echo 03/07/2016 Mild conc LVH, EF 55-60, no RWMA, Gr 1 DD, mild MR  ETT 03/07/2016  Blood pressure demonstrated a normal response to exercise.  There was no ST segment deviation noted during stress.  Echo 01/16/12 Mild LVH, EF 55-60, no RWMA, Gr 1 DD, MV systolic bowing, no MVP, mod MR, mild LAE   Past Medical History:  Diagnosis Date   Adenomatous colon polyp 2003   Cholelithiasis    Diverticulosis of colon    Gastric polyps    GERD (gastroesophageal reflux disease)    Helicobacter pylori (H. pylori)    Hiatal hernia    Hypertension    Mitral valve prolapse    Nephrolithiasis    Osteoarthritis    Osteopenia 03/2017   T score -1.5 FRAX 8.9% / 0.9%.  Stable from prior DEXA 2016   TGA (transient global amnesia)    Past Surgical History:  Procedure Laterality Date   ANTERIOR AND POSTERIOR REPAIR N/A 06/14/2014   Procedure: ANTERIOR (CYSTOCELE) AND POSTERIOR REPAIR (RECTOCELE);  Surgeon: Anastasio Auerbach, MD;  Location: [facility];  Service: Gynecology;  Laterality: N/A;   CHOLECYSTECTOMY     kidney stone removal     x2   VAGINAL HYSTERECTOMY N/A  06/14/2014   Procedure: HYSTERECTOMY VAGINAL;  Surgeon: Anastasio Auerbach, MD;  Location: [facility];  Service: Gynecology;  Laterality: N/A;     No outpatient medications have been marked as taking for the 04/03/18 encounter (Appointment) with Richardson Dopp T, PA-C.     Allergies:   Penicillins; Ibuprofen; and Erythromycin   Social History   Tobacco Use   Smoking status: Never Smoker   Smokeless tobacco: Never Used  Substance Use Topics   Alcohol use: Yes    Alcohol/week: 0.0 standard drinks    Comment: Occas   Drug use: No     Family Hx: The patient's family history  includes Breast cancer (age of onset: 88) in her paternal aunt; Cancer in her father; Congestive Heart Failure in her mother; Diabetes in her maternal grandmother; Heart disease in her maternal grandfather and maternal grandmother; Melanoma in her paternal aunt; Neuropathy in her mother.  ROS:   Please see the history of present illness.    All other systems reviewed and are negative.   Labs/Other Tests and Data Reviewed:    Recent Labs: 03/16/2018: B Natriuretic Peptide 171.9 03/17/2018: TSH 0.409 03/18/2018: ALT 12 03/25/2018: BUN 17; Creatinine, Ser 1.06; Hemoglobin 11.7; Magnesium 1.9; Platelets 230.0; Potassium 4.4; Sodium 140   Recent Lipid Panel Lab Results  Component Value Date/Time   CHOL 194 10/28/2017 04:57 PM   TRIG 170.0 (H) 10/28/2017 04:57 PM   HDL 57.10 10/28/2017 04:57 PM   CHOLHDL 3 10/28/2017 04:57 PM   LDLCALC 103 (H) 10/28/2017 04:57 PM   LDLDIRECT 81.0 05/02/2014 12:03 PM    Wt Readings from Last 3 Encounters:  03/25/18 175 lb 6 oz (79.5 kg)  03/18/18 182 lb 3.2 oz (82.6 kg)  03/16/18 174 lb 9.6 oz (79.2 kg)     Exam:    Vital Signs:  BP 131/88    Pulse 86    Temp (!) 95.6 F (35.3 C)    Ht 5\' 4"  (1.626 m)    Wt 168 lb (76.2 kg)    BMI 28.84 kg/m    Telephone visit - no visual exam done  ASSESSMENT & PLAN:    Paroxysmal atrial fibrillation ([place]) She was recently admitted with AF with RVR in the setting of a viral URI and worsening asthma symptoms.  She was hypokalemic and hypomagnesemic as well.  She failed DCCV in the ED but did convert back to normal sinus rhythm with her beta-blocker and replacing her electrolytes.  Recent Hgb and Creatinine was stable.  She has not had any symptoms of atrial fibrillation recently.    -Continue Apixaban, Nadolol  -Ok to take extra 1/2 of a Nadolol if recurrent AFib.  -She will call if she has recurrent symptoms that do not resolve with prn beta-blocker   -We discussed antiarrhythmic drugs vs ablation should she  develop more frequent AFib in the future   Essential hypertension The patient's blood pressure is controlled on her current regimen.  Continue current therapy.  Her HCTZ was stopped in the hospital due to low K+.  She can take as needed for increased swelling.  However, if she notices she has to take the HCTZ quite a bit, she should call.  I would give her Spironolactone to take as needed at that point.    Mitral valve insufficiency, unspecified etiology Mild MR by echo in 2018.  She has occasional palpitations related to this but no symptoms of recurrent AFib.  Hypokalemia Recent labs with normal potassium and magnesium.  COVID-19 Education: The signs and symptoms of COVID-19 were discussed with the patient and how to seek care for testing (follow up with PCP or arrange E-visit).  The importance of social distancing was discussed today.  Patient Risk:   After full review of this patients clinical status, I feel that they are at least moderate risk at this time.  Time:   Today, I have spent 18 minutes with the patient with telehealth technology discussing the above problems.     Medication Adjustments/Labs and Tests Ordered: Current medicines are reviewed at length with the patient today.  Concerns regarding medicines are outlined above.  Tests Ordered: No orders of the defined types were placed in this encounter.  Medication Changes: No orders of the defined types were placed in this encounter.   Disposition:  as scheduled with Dr. Acie Fredrickson in June 2020  Signed, Richardson Dopp, Hershal Coria  04/02/2018 10:09 PM    [place]

## 2018-04-02 NOTE — Telephone Encounter (Signed)
Discussed with patient. She is able to do telehealth for appt tomorrow Richardson Dopp, PA-C    04/02/2018 12:25 PM

## 2018-04-03 ENCOUNTER — Telehealth (INDEPENDENT_AMBULATORY_CARE_PROVIDER_SITE_OTHER): Payer: PPO | Admitting: Physician Assistant

## 2018-04-03 ENCOUNTER — Other Ambulatory Visit: Payer: Self-pay

## 2018-04-03 VITALS — BP 131/88 | HR 86 | Temp 95.6°F | Ht 64.0 in | Wt 168.0 lb

## 2018-04-03 DIAGNOSIS — I480 Paroxysmal atrial fibrillation: Secondary | ICD-10-CM

## 2018-04-03 DIAGNOSIS — I340 Nonrheumatic mitral (valve) insufficiency: Secondary | ICD-10-CM | POA: Diagnosis not present

## 2018-04-03 DIAGNOSIS — E876 Hypokalemia: Secondary | ICD-10-CM

## 2018-04-03 DIAGNOSIS — I10 Essential (primary) hypertension: Secondary | ICD-10-CM | POA: Diagnosis not present

## 2018-04-03 NOTE — Patient Instructions (Signed)
Medication Instructions:  No changes  If you need a refill on your cardiac medications before your next appointment, please call your pharmacy.   Lab work: None   If you have labs (blood work) drawn today and your tests are completely normal, you will receive your results only by: [name] MyChart Message (if you have MyChart) OR . A paper copy in the mail If you have any lab test that is abnormal or we need to change your treatment, we will call you to review the results.  Testing/Procedures: None   Follow-Up: At [facility], you and your health needs are our priority.  As part of our continuing mission to provide you with exceptional heart care, we have created designated Provider Care Teams.  These Care Teams include your primary Cardiologist (physician) and Advanced Practice Providers (APPs -  Physician Assistants and Nurse Practitioners) who all work together to provide you with the care you need, when you need it. . Dr. Acie Fredrickson in June as planned  Any Other Special Instructions Will Be Listed Below (If Applicable).

## 2018-04-06 ENCOUNTER — Ambulatory Visit: Payer: PPO | Admitting: Cardiology

## 2018-05-04 ENCOUNTER — Encounter: Payer: PPO | Admitting: Gynecology

## 2018-06-05 ENCOUNTER — Other Ambulatory Visit: Payer: Self-pay | Admitting: Cardiovascular Disease

## 2018-06-05 NOTE — Telephone Encounter (Signed)
Pt last saw Richardson Dopp, [place] on 04/03/18 telemedicine COVID-19, last labs 03/25/18 Creat 1.06, age 68, weight 79.5kg, based on specified criteria pt is on appropriate dosage of Eliquis 5mg  BID.  Will refill rx.

## 2018-06-09 ENCOUNTER — Telehealth: Payer: Self-pay | Admitting: Cardiovascular Disease

## 2018-06-09 ENCOUNTER — Telehealth: Payer: Self-pay

## 2018-06-09 NOTE — Telephone Encounter (Signed)
Is she avoiding salt. She was on HCTZ in the past but it was stopped due to hypokalemia. There is mention that we should try spironolactone instead  Please make sure she is not eating salt.  Also can she send [place] a BP . I

## 2018-06-09 NOTE — Telephone Encounter (Signed)
Spoke to pt while obtaining consent for upcoming appt. She expressed since she has stopped taking the HCTZ that she has been experiencing quite a lot of leg swelling. She would like to know do you recommend anything?

## 2018-06-09 NOTE — Telephone Encounter (Signed)
Pt gave consent for a phone visit. Pt will have BP and HR ready for visit, she does not have a scale.   YOUR CARDIOLOGY TEAM HAS ARRANGED FOR AN E-VISIT FOR YOUR APPOINTMENT - PLEASE REVIEW IMPORTANT INFORMATION BELOW SEVERAL DAYS PRIOR TO YOUR APPOINTMENT  Due to the recent COVID-19 pandemic, we are transitioning in-person office visits to tele-medicine visits in an effort to decrease unnecessary exposure to our patients, their families, and staff. These visits are billed to your insurance just like a normal visit is. We also encourage you to sign up for MyChart if you have not already done so. You will need a smartphone if possible. For patients that do not have this, we can still complete the visit using a regular telephone but do prefer a smartphone to enable video when possible. You may have a family member that lives with you that can help. If possible, we also ask that you have a blood pressure cuff and scale at home to measure your blood pressure, heart rate and weight prior to your scheduled appointment. Patients with clinical needs that need an in-person evaluation and testing will still be able to come to the office if absolutely necessary. If you have any questions, feel free to call our office.     YOUR PROVIDER WILL BE USING THE FOLLOWING PLATFORM TO COMPLETE YOUR VISIT: [email or web address]  . IF USING MYCHART - How to Download the MyChart App to Your SmartPhone   - If Apple, go to [employer] and type in MyChart in the search bar and download the app. If Android, ask patient to go to [employer] and type in [place] in the search bar and download the app. The app is free but as with any other app downloads, your phone may require you to verify saved payment information or Apple/Android password.  - You will need to then log into the app with your MyChart username and password, and select  as your healthcare provider to link the account.  - When it is time for your visit, go to the  MyChart app, find appointments, and click Begin Video Visit. Be sure to Select Allow for your device to access the Microphone and Camera for your visit. You will then be connected, and your provider will be with you shortly.  **If you have any issues connecting or need assistance, please contact MyChart service desk [phone])**  **If using a computer, in order to ensure the best quality for your visit, you will need to use either of the following Internet Browsers: Insurance underwriter or [employer]**  . IF USING DOXIMITY or [email or web address].ME - The staff will give you instructions on receiving your link to join the meeting the day of your visit.      2-3 DAYS BEFORE YOUR APPOINTMENT  You will receive a telephone call from one of our [place] team members - your caller ID may say "Unknown caller." If this is a video visit, we will walk you through how to get the video launched on your phone. We will remind you check your blood pressure, heart rate and weight prior to your scheduled appointment. If you have an Apple Watch or Kardia, please upload any pertinent ECG strips the day before or morning of your appointment to [place]. Our staff will also make sure you have reviewed the consent and agree to move forward with your scheduled tele-health visit.     THE DAY OF YOUR APPOINTMENT  Approximately 15 minutes prior  to your scheduled appointment, you will receive a telephone call from one of [place] team - your caller ID may say "Unknown caller."  Our staff will confirm medications, vital signs for the day and any symptoms you may be experiencing. Please have this information available prior to the time of visit start. It may also be helpful for you to have a pad of paper and pen handy for any instructions given during your visit. They will also walk you through joining the smartphone meeting if this is a video visit.    CONSENT FOR TELE-HEALTH VISIT - PLEASE REVIEW  I hereby voluntarily  request, consent and authorize [facility] and its employed or contracted physicians, physician assistants, nurse practitioners or other licensed health care professionals (the Practitioner), to provide me with telemedicine health care services (the "Services") as deemed necessary by the treating Practitioner. I acknowledge and consent to receive the Services by the Practitioner via telemedicine. I understand that the telemedicine visit will involve communicating with the Practitioner through live audiovisual communication technology and the disclosure of certain medical information by electronic transmission. I acknowledge that I have been given the opportunity to request an in-person assessment or other available alternative prior to the telemedicine visit and am voluntarily participating in the telemedicine visit.  I understand that I have the right to withhold or withdraw my consent to the use of telemedicine in the course of my care at any time, without affecting my right to future care or treatment, and that the Practitioner or I may terminate the telemedicine visit at any time. I understand that I have the right to inspect all information obtained and/or recorded in the course of the telemedicine visit and may receive copies of available information for a reasonable fee.  I understand that some of the potential risks of receiving the Services via telemedicine include:  [name] Delay or interruption in medical evaluation due to technological equipment failure or disruption; . Information transmitted may not be sufficient (e.g. poor resolution of images) to allow for appropriate medical decision making by the Practitioner; and/or  . In rare instances, security protocols could fail, causing a breach of personal health information.  Furthermore, I acknowledge that it is my responsibility to provide information about my medical history, conditions and care that is complete and accurate to the best of my  ability. I acknowledge that Practitioner's advice, recommendations, and/or decision may be based on factors not within their control, such as incomplete or inaccurate data provided by me or distortions of diagnostic images or specimens that may result from electronic transmissions. I understand that the practice of medicine is not an exact science and that Practitioner makes no warranties or guarantees regarding treatment outcomes. I acknowledge that I will receive a copy of this consent concurrently upon execution via email to the email address I last provided but may also request a printed copy by calling the office of [place].    I understand that my insurance will be billed for this visit.   I have read or had this consent read to me. . I understand the contents of this consent, which adequately explains the benefits and risks of the Services being provided via telemedicine.  . I have been provided ample opportunity to ask questions regarding this consent and the Services and have had my questions answered to my satisfaction. . I give my informed consent for the services to be provided through the use of telemedicine in my medical care  By participating  in this telemedicine visit I agree to the above.

## 2018-06-09 NOTE — Telephone Encounter (Signed)
Pt c/o BP issue: STAT if pt c/o blurred vision, one-sided weakness or slurred speech  1. What are your last 5 BP readings? 118/80   73      130/90  70      145/94 69      2. Are you having any other symptoms (ex. Dizziness, headache, blurred vision, passed out)? She did have a migraine the past two days.  Some swelling in her feet  3. What is your BP issue? Elevated BP.

## 2018-06-10 MED ORDER — HYDROCHLOROTHIAZIDE 25 MG PO TABS: 25.0000 mg | ORAL_TABLET | Freq: Every day | ORAL | 11 refills | Status: DC | PRN

## 2018-06-10 NOTE — Telephone Encounter (Signed)
Called patient and reviewed Dr. Elmarie Shiley advice with her. She states she took HCTZ 25 mg last night and BP today 107/88 mmHg. She states she stopped the HCTZ after her hospitalization in March and was advised by Richardson Dopp, PA during her follow-up visit that she could take the HCTZ as needed. Reports she is allergic to apple cider vinegar and she thinks she ate something containing that because she had a horrible migraine after the cook out; states BP was high after that. Reports low sodium diet except for an occasional hot dog.  She asked what medication Nicki Reaper suggested to take the place of HCTZ at that time and I advised that it is spironolactone. I advised her to continue to monitor and to take HCTZ as needed. If she needs HCTZ frequently, I advised her to call back prior to virtual visit with Dr. Acie Fredrickson on 6/11. She verbalized understanding and agreement and thanked me for the call.

## 2018-06-18 ENCOUNTER — Other Ambulatory Visit: Payer: Self-pay

## 2018-06-18 ENCOUNTER — Telehealth (INDEPENDENT_AMBULATORY_CARE_PROVIDER_SITE_OTHER): Payer: PPO | Admitting: Cardiovascular Disease

## 2018-06-18 ENCOUNTER — Encounter: Payer: Self-pay | Admitting: Cardiovascular Disease

## 2018-06-18 VITALS — BP 122/85 | HR 65 | Temp 97.2°F | Ht 64.0 in | Wt 168.0 lb

## 2018-06-18 DIAGNOSIS — I480 Paroxysmal atrial fibrillation: Secondary | ICD-10-CM | POA: Diagnosis not present

## 2018-06-18 DIAGNOSIS — Z7189 Other specified counseling: Secondary | ICD-10-CM | POA: Diagnosis not present

## 2018-06-18 DIAGNOSIS — I341 Nonrheumatic mitral (valve) prolapse: Secondary | ICD-10-CM | POA: Diagnosis not present

## 2018-06-18 MED ORDER — HYDROCHLOROTHIAZIDE 25 MG PO TABS: 25.0000 mg | ORAL_TABLET | Freq: Every day | ORAL | 6 refills | Status: DC | PRN

## 2018-06-18 MED ORDER — POTASSIUM CHLORIDE ER 10 MEQ PO TBCR: 10.0000 meq | EXTENDED_RELEASE_TABLET | Freq: Every day | ORAL | 6 refills | Status: DC | PRN

## 2018-06-18 NOTE — Patient Instructions (Signed)
Medication Instructions:  Your physician has recommended you make the following change in your medication:  TAKE HCTZ (Hydrochlorothiazide) 25 mg once daily as needed for weight gain/swelling TAKE Kdur (Potassium chloride) 10 mEq once daily as needed on the same day you take HCTZ  If you need a refill on your cardiac medications before your next appointment, please call your pharmacy.    Lab work: None Ordered   Testing/Procedures: None Ordered   Follow-Up: At [facility], you and your health needs are our priority.  As part of our continuing mission to provide you with exceptional heart care, we have created designated Provider Care Teams.  These Care Teams include your primary Cardiologist (physician) and Advanced Practice Providers (APPs -  Physician Assistants and Nurse Practitioners) who all work together to provide you with the care you need, when you need it. You will need a follow up appointment in:  1 years.  Please call our office 2 months in advance to schedule this appointment.  You may see Mertie Moores, MD or one of the following Advanced Practice Providers on your designated Care Team: Richardson Dopp, PA-C [place], [place] . Daune Perch, NP

## 2018-06-18 NOTE — Progress Notes (Signed)
Virtual Visit via Telephone Note   This visit type was conducted due to national recommendations for restrictions regarding the COVID-19 Pandemic (e.g. social distancing) in an effort to limit this patient's exposure and mitigate transmission in our community.  Due to her co-morbid illnesses, this patient is at least at moderate risk for complications without adequate follow up.  This format is felt to be most appropriate for this patient at this time.  The patient did not have access to video technology/had technical difficulties with video requiring transitioning to audio format only (telephone).  All issues noted in this document were discussed and addressed.  No physical exam could be performed with this format.  Please refer to the patient's chart for her  consent to telehealth for [facility].   Date:  06/18/2018   ID:  [name], DOB Sep 17, 1950, MRN [id number]  Patient Location: Home Provider Location: Home  PCP:  [name], DO  Cardiologist:  Mertie Moores, MD  Electrophysiologist:  None   Problem List: 1. Paroxysmal atrial fibrillation - documented Dec. 2013.   CHADS2VASC = 3 ( female, age, HTN)  -  2. Hypertension -    3.  Mitral Valve prolapse 4. Asthma 5, GERD   Notes from 2014: Anna Marquez is a 68 year old female with a history of mitral valve prolapse and hypertension in the past. She recently presented to the hospital with palpitations.  She woke up with palpitations.  She drinks alcohol only very rarely.  She was found to have atrial fibrillation with a rapid ventricular response. She taken an extra Corgard prior to coming to the emergency room. She converted to sinus rhythm while in the emergency room. Followup EKG revealed normal sinus rhythm.  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 She tries to exercise on a daily basis.  She has had occasional palpitations that sound like PVCs .    April 27, 2012:  Anna Marquez  is doing well.  No palpitations.  Exercising regularly.  No complaints  April 03, 2016:  Anna Marquez was recently admitted to the hospital with chest pain. She has a history of paroxysmal atrial fibrillation, hypertension, mitral valve prolapse, asthma, and gastroesophageal reflux disease.  She had a treadmill status which was negative for ischemia. She has normal left ventricular systolic function by echo.  She walks regularly .  Has had some CP that she thinks is reflux.   Is taking PPI   Is retired from [employer]   May 23, 2017: Anna Marquez is seen today for follow-up of her paroxysmal atrial for ablation, hypertension, mitral valve prolapse, and asthma.  She was seen last year. Has some palpitations.  Last for a few minutes. Also has some HR irregularities that last for several hours.  These typically get better if she takes another Corgard.  Evaluation Performed:  Follow-Up Visit  Chief Complaint:  HTN  History of Present Illness:    Anna Marquez is a 68 y.o. female with HTN, PAF, MVP . Has had rare episodes of palpitations ( ? PAF) took 1/2 corgard which helped. She has had some leg swelling because she has stopped her HCTZ.    BP is normal today without HCTZ.Anna Marquez tries to avoid salty foods. Eats hotdogs on occasion Tries to walk 2 miles a day ,  Most days a week .    The patient does not have symptoms concerning for COVID-19 infection (fever, chills, cough, or new shortness of breath).  Past Medical History:  Diagnosis Date  . Adenomatous colon polyp 2003  . Cholelithiasis   . Diverticulosis of colon   . Gastric polyps   . GERD (gastroesophageal reflux disease)   . Helicobacter pylori (H. pylori)   . Hiatal hernia   . Hypertension   . Mitral valve prolapse   . Nephrolithiasis   . Osteoarthritis   . Osteopenia 03/2017   T score -1.5 FRAX 8.9% / 0.9%.  Stable from prior DEXA 2016  . TGA (transient global amnesia)    Past Surgical History:  Procedure  Laterality Date  . ANTERIOR AND POSTERIOR REPAIR N/A 06/14/2014   Procedure: ANTERIOR (CYSTOCELE) AND POSTERIOR REPAIR (RECTOCELE);  Surgeon: Anastasio Auerbach, MD;  Location: [facility];  Service: Gynecology;  Laterality: N/A;  . CHOLECYSTECTOMY    . kidney stone removal     x2  . VAGINAL HYSTERECTOMY N/A 06/14/2014   Procedure: HYSTERECTOMY VAGINAL;  Surgeon: Anastasio Auerbach, MD;  Location: [facility];  Service: Gynecology;  Laterality: N/A;     Current Meds  Medication Sig  . albuterol (PROVENTIL HFA;VENTOLIN HFA) 108 (90 Base) MCG/ACT inhaler Inhale 1-2 puffs into the lungs every 6 (six) hours as needed for wheezing or shortness of breath.  . Ascorbic Acid (VITAMIN C) 1000 MG tablet Take 1,000 mg by mouth daily.  . Cholecalciferol (VITAMIN D) 50 MCG (2000 UT) tablet Take 2,000 Units by mouth daily.  . diphenhydrAMINE-APAP, sleep, (TYLENOL PM EXTRA STRENGTH PO) Take 2 tablets by mouth at bedtime as needed (pain).  Anna Marquez ELIQUIS 5 MG TABS tablet TAKE 1 TABLET BY MOUTH 2 TIMES DAILY.  . fluticasone (FLONASE) 50 MCG/ACT nasal spray Place 1 spray into both nostrils as needed for allergies or rhinitis.  Anna Marquez ibuprofen (ADVIL,MOTRIN) 200 MG tablet Take 400 mg by mouth every 6 (six) hours as needed for mild pain.  Anna Marquez lisinopril (PRINIVIL,ZESTRIL) 10 MG tablet Take 1 tablet (10 mg total) by mouth daily.  Anna Marquez loratadine (CLARITIN) 10 MG tablet Take 10 mg by mouth as needed.   . nadolol (CORGARD) 40 MG tablet TAKE 1 TABLET BY MOUTH TWICE DAILY.  . pantoprazole (PROTONIX) 40 MG tablet Take 1 tablet (40 mg total) by mouth daily.     Allergies:   Erythromycin, Penicillins, and Ibuprofen   Social History   Tobacco Use  . Smoking status: Never Smoker  . Smokeless tobacco: Never Used  Substance Use Topics  . Alcohol use: Yes    Alcohol/week: 0.0 standard drinks    Comment: Occas  . Drug use: No     Family Hx: The patient's family history includes Breast cancer (age of onset: 42) in her paternal aunt; Cancer  in her father; Congestive Heart Failure in her mother; Diabetes in her maternal grandmother; Heart disease in her maternal grandfather and maternal grandmother; Melanoma in her paternal aunt; Neuropathy in her mother.  ROS:   Please see the history of present illness.     All other systems reviewed and are negative.   Prior CV studies:   The following studies were reviewed today:    Labs/Other Tests and Data Reviewed:    EKG:  No ECG reviewed.  Recent Labs: 03/16/2018: B Natriuretic Peptide 171.9 03/17/2018: TSH 0.409 03/18/2018: ALT 12 03/25/2018: BUN 17; Creatinine, Ser 1.06; Hemoglobin 11.7; Magnesium 1.9; Platelets 230.0; Potassium 4.4; Sodium 140   Recent Lipid Panel Lab Results  Component Value Date/Time   CHOL 194 10/28/2017 04:57 PM   TRIG 170.0 (H) 10/28/2017 04:57 PM  HDL 57.10 10/28/2017 04:57 PM   CHOLHDL 3 10/28/2017 04:57 PM   LDLCALC 103 (H) 10/28/2017 04:57 PM   LDLDIRECT 81.0 05/02/2014 12:03 PM    Wt Readings from Last 3 Encounters:  06/18/18 168 lb (76.2 kg)  04/03/18 168 lb (76.2 kg)  03/25/18 175 lb 6 oz (79.5 kg)     Objective:    Vital Signs:  BP 122/85 (BP Location: Left Arm, Patient Position: Sitting, Cuff Size: Normal)   Pulse 65   Temp (!) 97.2 F (36.2 C)   Ht 5\' 4"  (1.626 m)   Wt 168 lb (76.2 kg)   BMI 28.84 kg/m      ASSESSMENT & PLAN:    1. PAF :  Has been overall well controlled.   Takes an extra 1/2 corgard if she has palps - which works well.  Cont. Eliquis  2.   HTN:   Leg edema .  Will give HCTZ 25  And kdur 10 meq to take as needed   Office visit in 1 year     COVID-19 Education: The signs and symptoms of COVID-19 were discussed with the patient and how to seek care for testing (follow up with PCP or arrange E-visit).  The importance of social distancing was discussed today.  Time:   Today, I have spent  21 minutes with the patient with telehealth technology discussing the above problems.     Medication  Adjustments/Labs and Tests Ordered: Current medicines are reviewed at length with the patient today.  Concerns regarding medicines are outlined above.   Tests Ordered: No orders of the defined types were placed in this encounter.   Medication Changes: No orders of the defined types were placed in this encounter.   Disposition:  Follow up in 1 year(s)  Signed, Mertie Moores, MD  06/18/2018 9:51 AM    [facility]

## 2018-06-19 ENCOUNTER — Telehealth: Payer: Self-pay

## 2018-06-19 ENCOUNTER — Ambulatory Visit: Payer: Self-pay

## 2018-06-19 NOTE — Telephone Encounter (Signed)
Questions for Screening COVID-19  Symptom onset: NONE Travel or Contacts: no   During this illness, did/does the patient experience any of the following symptoms? Fever >100.50F []   Yes [x]   No []   Unknown Subjective fever (felt feverish) []   Yes []   No []   Unknown Chills []   Yes []   No []   Unknown Muscle aches (myalgia) []   Yes []   No []   Unknown Runny nose (rhinorrhea) []   Yes []   No []   Unknown Sore throat []   Yes []   No []   Unknown Cough (new onset or worsening of chronic cough) []   Yes []   No []   Unknown Shortness of breath (dyspnea) []   Yes []   No []   Unknown Nausea or vomiting []   Yes []   No []   Unknown Headache []   Yes []   No []   Unknown Abdominal pain  []   Yes []   No []   Unknown Diarrhea (?3 loose/looser than normal stools/24hr period) []   Yes []   No []   Unknown Other, specify:  Patient risk factors: Smoker? []   Current []   Former []   Never If female, currently pregnant? []   Yes []   No  Patient Active Problem List   Diagnosis Date Noted  . Hypomagnesemia 03/25/2018  . Viral upper respiratory tract infection 03/16/2018  . Anemia 02/04/2018  . Recurrent nephrolithiasis 02/04/2018  . Adhesive capsulitis 02/04/2018  . Routine general medical examination at a health care facility 10/28/2017  . Acute cystitis 10/22/2017  . Palpitations 01/31/2017  . Cough 11/21/2016  . Hypokalemia 03/07/2016  . TIA (transient ischemic attack) 03/07/2016  . Hematuria 12/06/2015  . Essential hypertension 01/27/2012  . Paroxysmal atrial fibrillation ([place]) 01/09/2012  . Migraine without aura 05/20/2008  . Allergic rhinitis 05/20/2008  . Mitral regurgitation 08/10/2007  . GERD 08/10/2007  . Diaphragmatic hernia 08/10/2007  . DIVERTICULOSIS, COLON 08/10/2007  . Osteoarthritis 08/10/2007  . COLONIC POLYPS, ADENOMATOUS, HX OF 08/10/2007    Plan:  []   High risk for COVID-19 with red flags go to ED (with CP, SOB, weak/lightheaded, or fever > 101.5). Call ahead.  []   High risk for COVID-19  but stable. Inform provider and coordinate time for [facility] visit.   []   No red flags but URI signs or symptoms okay for [facility] visit.

## 2018-06-19 NOTE — Telephone Encounter (Signed)
Incoming call from Pt.  Who complains of right foot swelling and having blisters on the bottom of it.  .  Pain is mild. Denies fever.     Reason for Disposition . [1] Swollen foot AND [2] no fever  (Exceptions: localized bump from bunions, calluses, insect bite, sting)  Answer Assessment - Initial Assessment Questions 1. ONSET: "When did the pain start?"      Yesterday 2. LOCATION: "Where is the pain located?"      Right foot 3. PAIN: "How bad is the pain?"    (Scale 1-10; or mild, moderate, severe)   -  MILD (1-3): doesn't interfere with normal activities    -  MODERATE (4-7): interferes with normal activities (e.g., work or school) or awakens from sleep, limping    -  SEVERE (8-10): excruciating pain, unable to do any normal activities, unable to walk     mild 4. WORK OR EXERCISE: "Has there been any recent work or exercise that involved this part of the body?"      Have blisters  Large blister two blister 5. CAUSE: "What do you think is causing the foot pain?"      6. OTHER SYMPTOMS: "Do you have any other symptoms?" (e.g., leg pain, rash, fever, numbness)    Denies fever  7. PREGNANCY: "Is there any chance you are pregnant?" "When was your last menstrual period?"  Protocols used: FOOT PAIN-A-AH

## 2018-06-19 NOTE — Telephone Encounter (Signed)
Pt called back , she was scheduled for Monday.

## 2018-06-19 NOTE — Telephone Encounter (Signed)
Called Pt LAM to schedule for today or recommend Dr. Zigmund Daniel of UC or Scheduled for Monday. Telephone encounter created.

## 2018-06-19 NOTE — Telephone Encounter (Signed)
OK for VV today if available, otherwise I can see her Monday or if worsening UC over the weekend.

## 2018-06-22 ENCOUNTER — Ambulatory Visit (INDEPENDENT_AMBULATORY_CARE_PROVIDER_SITE_OTHER): Payer: PPO | Admitting: Family Medicine

## 2018-06-22 ENCOUNTER — Encounter: Payer: Self-pay | Admitting: Family Medicine

## 2018-06-22 VITALS — BP 132/84 | HR 78 | Temp 98.2°F | Resp 18 | Ht 63.0 in | Wt 174.6 lb

## 2018-06-22 DIAGNOSIS — W57XXXA Bitten or stung by nonvenomous insect and other nonvenomous arthropods, initial encounter: Secondary | ICD-10-CM | POA: Diagnosis not present

## 2018-06-22 DIAGNOSIS — R519 Headache, unspecified: Secondary | ICD-10-CM

## 2018-06-22 DIAGNOSIS — R51 Headache: Secondary | ICD-10-CM | POA: Diagnosis not present

## 2018-06-22 DIAGNOSIS — R21 Rash and other nonspecific skin eruption: Secondary | ICD-10-CM

## 2018-06-22 LAB — CBC WITH DIFFERENTIAL/PLATELET
Basophils Absolute: 0.1 10*3/uL (ref 0.0–0.1)
Basophils Relative: 1 % (ref 0.0–3.0)
Eosinophils Absolute: 0.2 10*3/uL (ref 0.0–0.7)
Eosinophils Relative: 3.5 % (ref 0.0–5.0)
HCT: 36.2 % (ref 36.0–46.0)
Hemoglobin: 12.7 g/dL (ref 12.0–15.0)
Lymphocytes Relative: 30.8 % (ref 12.0–46.0)
Lymphs Abs: 1.7 10*3/uL (ref 0.7–4.0)
MCHC: 35 g/dL (ref 30.0–36.0)
MCV: 86.6 fl (ref 78.0–100.0)
Monocytes Absolute: 0.4 10*3/uL (ref 0.1–1.0)
Monocytes Relative: 8 % (ref 3.0–12.0)
Neutro Abs: 3.1 10*3/uL (ref 1.4–7.7)
Neutrophils Relative %: 56.7 % (ref 43.0–77.0)
Platelets: 173 10*3/uL (ref 150.0–400.0)
RBC: 4.18 Mil/uL (ref 3.87–5.11)
RDW: 13.5 % (ref 11.5–15.5)
WBC: 5.5 10*3/uL (ref 4.0–10.5)

## 2018-06-22 LAB — COMPREHENSIVE METABOLIC PANEL
ALT: 9 U/L (ref 0–35)
AST: 11 U/L (ref 0–37)
Albumin: 4.2 g/dL (ref 3.5–5.2)
Alkaline Phosphatase: 44 U/L (ref 39–117)
BUN: 13 mg/dL (ref 6–23)
CO2: 28 mEq/L (ref 19–32)
Calcium: 9.5 mg/dL (ref 8.4–10.5)
Chloride: 105 mEq/L (ref 96–112)
Creatinine, Ser: 0.86 mg/dL (ref 0.40–1.20)
GFR: 65.35 mL/min (ref >60.00)
Glucose, Bld: 90 mg/dL (ref 70–99)
Potassium: 3.4 mEq/L — ABNORMAL LOW (ref 3.5–5.1)
Sodium: 141 mEq/L (ref 135–145)
Total Bilirubin: 1 mg/dL (ref 0.2–1.2)
Total Protein: 6.7 g/dL (ref 6.0–8.3)

## 2018-06-22 MED ORDER — DOXYCYCLINE HYCLATE 100 MG PO TABS: 100.0000 mg | ORAL_TABLET | Freq: Two times a day (BID) | ORAL | 0 refills | Status: AC

## 2018-06-22 NOTE — Telephone Encounter (Signed)
Pt was pre-screened at her car before entrance to office. All answers were no. Task completed.

## 2018-06-22 NOTE — Patient Instructions (Signed)
[facility] [place] Spotted Fever [place] spotted fever is a bacterial infection that spreads to people through contact with certain ticks. The illness causes flu-like symptoms and a reddish-purple rash. The illness does not spread from person to person (is not contagious). When this condition is not treated right away, it can quickly become very serious, and can sometimes lead to long-term (chronic) health problems or even death. What are the causes? This condition is caused by a type of bacteria (Rickettsia rickettsii) that is carried by [place] dog ticks, brown dog ticks, and [employer] wood ticks. The infection spreads through:  A bite from an infected tick. Tick bites are usually painless, and they frequently are not noticed.  Infected tick blood, body fluids, or feces that get into the body through damaged skin, such as a small cut or sore. This could happen while removing a tick from a pet or from another person. What increases the risk? The following factors may make you more likely to develop this condition:  Spending a lot of time outdoors, especially in rural areas or areas with long grass.  Spending time outdoors during warm weather. Ticks are most active during warm weather. What are the signs or symptoms? Symptoms of this condition include:  Fever.  Muscle aches.  Headache.  Nausea.  Vomiting.  Poor appetite.  Abdominal pain.  A reddish-purple rash. ? This usually appears 2-5 days after the first symptoms begin. ? The rash often starts on the wrists, forearms, and ankles. It may then spread to the palms, the bottom of the feet, legs, and trunk. Symptoms may develop 2-14 days after a tick bite. How is this diagnosed? This condition is diagnosed based on:  Your medical history.  A physical exam.  Blood tests.  Whether you have recently been bitten by a tick or spent time in areas where: ? Ticks are common. ? [facility] spotted fever is common.  How is this treated? This condition is treated with antibiotic medicines. It is important to begin treatment right away. In some cases, your health care provider may begin treatment before the diagnosis is confirmed. If your symptoms are severe, you may need to be treated in the hospital where you can get antibiotics and be monitored during treatment. Follow these instructions at home:  Take over-the-counter and prescription medicines only as told by your health care provider.  Take your antibiotic medicine as told by your health care provider. Do not stop taking the antibiotic even if you start to feel better.  Rest as much as possible until you feel better. Return to your normal activities as told by your health care provider.  Drink enough fluid to keep your urine pale yellow.  Keep all follow-up visits as told by your health care provider. This is important. Contact a health care provider if:  You have a rash that gets increasingly red or swollen.  You have fluid draining from any areas of your rash. Get help right away if:  You develop a fever after being bitten by a tick.  You develop a rash 2-5 days after experiencing flu-like symptoms.  You have chest pain.  You have shortness of breath.  You have a severe headache.  You have jerky movements you cannot control (seizure).  You have severe abdominal pain.  You feel confused.  You bruise easily.  You have bleeding from your gums.  You have blood in your stool (feces).  You have trouble controlling when you urinate or have bowel  movements (incontinence).  You have vision problems.  You have numbness or tingling in your arms or legs. Summary  [facility] spotted fever is a bacterial infection that spreads to people through contact with certain ticks.  When this condition is not treated right away, it can quickly become very serious, and can sometimes lead to long-term (chronic) health problems or even death.   You are more likely to develop this infection if you spend time outdoors in warm weather and in areas with tall grass.  Symptoms of this condition include fever, headache, nausea, vomiting, abdominal pain, muscle aches, and a reddish-purple rash that usually appears 2-5 days after a fever.  This condition is treated with antibiotic medicines. This information is not intended to replace advice given to you by your health care provider. Make sure you discuss any questions you have with your health care provider. Document Released: 04/07/2000 Document Revised: 03/21/2016 Document Reviewed: 03/21/2016 Elsevier Interactive Patient Education  2019 [employer].

## 2018-06-23 LAB — ROCKY MTN SPOTTED FVR ABS PNL(IGG+IGM)
RMSF IgG: NOT DETECTED
RMSF IgM: NOT DETECTED

## 2018-06-24 NOTE — Progress Notes (Signed)
Lab tests look ok, potassium is just a little low.  Recommend increase intake of potassium rich foods to help with this.   RMSF antibody is negative but I would recommend she complete course of antibiotics.   Any changes since visit with me?

## 2018-06-25 ENCOUNTER — Encounter: Payer: Self-pay | Admitting: Family Medicine

## 2018-06-25 DIAGNOSIS — R21 Rash and other nonspecific skin eruption: Secondary | ICD-10-CM | POA: Insufficient documentation

## 2018-06-25 NOTE — Progress Notes (Signed)
Anna Marquez - 68 y.o. female MRN [id number]  Date of birth: Feb 10, 1950  Subjective Chief Complaint  Patient presents with  . Foot Swelling    possible inscet bite on R 2nd/3rd toe , rash/swelling/blisters    HPI Anna Marquez is a 68 y.o. female here today with complaint of rash as well as blister on foot.  She reports that she had rash on R foot that lasted a couple of days.  Also had some mild swelling.  The rash and swelling have almost resolved on the foot but she did notice a blister to the bottom of the foot a few days ago as well.  Area is not painful.  She has noticed rash just under both breasts R>L as well and some spots on her arms.  She denies any new soaps, medications, detergents etc.  She does report that she has had a headache for the past few days as well which is uncommon for her.  She also states that she removed a tick from her L arm about 10 days ago.  She denies fever, chills, abdominal pain/nausea.    ROS:  A comprehensive ROS was completed and negative except as noted per HPI  Allergies  Allergen Reactions  . Erythromycin Nausea And Vomiting and Other (See Comments)    REACTION: Stomach upset  . Penicillins Hives and Shortness Of Breath    Has patient had a PCN reaction causing immediate rash, facial/tongue/throat swelling, SOB or lightheadedness with hypotension: yes Has patient had a PCN reaction causing severe rash involving mucus membranes or skin necrosis: no Has patient had a PCN reaction that required hospitalization: unknown Has patient had a PCN reaction occurring within the last 10 years: no If all of the above answers are "NO", then may proceed with Cephalosporin use.   . Ibuprofen Other (See Comments)    Pt states allergy to NSAIDS is secondary to ulcers NOT anaphylaxis. Tolerates daily 325 ASA at home    Past Medical History:  Diagnosis Date  . Adenomatous colon polyp 2003  . Cholelithiasis   . Diverticulosis of colon   . Gastric polyps   .  GERD (gastroesophageal reflux disease)   . Helicobacter pylori (H. pylori)   . Hiatal hernia   . Hypertension   . Mitral valve prolapse   . Nephrolithiasis   . Osteoarthritis   . Osteopenia 03/2017   T score -1.5 FRAX 8.9% / 0.9%.  Stable from prior DEXA 2016  . TGA (transient global amnesia)     Past Surgical History:  Procedure Laterality Date  . ANTERIOR AND POSTERIOR REPAIR N/A 06/14/2014   Procedure: ANTERIOR (CYSTOCELE) AND POSTERIOR REPAIR (RECTOCELE);  Surgeon: Anastasio Auerbach, MD;  Location: [facility];  Service: Gynecology;  Laterality: N/A;  . CHOLECYSTECTOMY    . kidney stone removal     x2  . VAGINAL HYSTERECTOMY N/A 06/14/2014   Procedure: HYSTERECTOMY VAGINAL;  Surgeon: Anastasio Auerbach, MD;  Location: [facility];  Service: Gynecology;  Laterality: N/A;    Social History   Socioeconomic History  . Marital status: Divorced    Spouse name: Not on file  . Number of children: Not on file  . Years of education: Not on file  . Highest education level: Not on file  Occupational History  . Not on file  Social Needs  . Financial resource strain: Not on file  . Food insecurity    Worry: Not on file    Inability: Not on file  .  Transportation needs    Medical: Not on file    Non-medical: Not on file  Tobacco Use  . Smoking status: Never Smoker  . Smokeless tobacco: Never Used  Substance and Sexual Activity  . Alcohol use: Yes    Alcohol/week: 0.0 standard drinks    Comment: Occas  . Drug use: No  . Sexual activity: Yes    Birth control/protection: Post-menopausal    Comment: 1st intercourse 68 yo-Fewer than 5 partners  Lifestyle  . Physical activity    Days per week: Not on file    Minutes per session: Not on file  . Stress: Not on file  Relationships  . Social Herbalist on phone: Not on file    Gets together: Not on file    Attends religious service: Not on file    Active member of club or organization: Not on file    Attends meetings of clubs  or organizations: Not on file    Relationship status: Not on file  Other Topics Concern  . Not on file  Social History Narrative  . Not on file    Family History  Problem Relation Age of Onset  . Cancer Father        prostate  . Breast cancer Paternal Aunt 53  . Diabetes Maternal Grandmother   . Heart disease Maternal Grandmother   . Heart disease Maternal Grandfather   . Melanoma Paternal Aunt   . Neuropathy Mother   . Congestive Heart Failure Mother     Health Maintenance  Topic Date Due  . Hepatitis C Screening  01-12-1950  . INFLUENZA VACCINE  08/08/2018  . MAMMOGRAM  03/03/2020  . COLONOSCOPY  03/13/2023  . TETANUS/TDAP  10/29/2027  . DEXA SCAN  Completed  . PNA vac Low Risk Adult  Completed    ----------------------------------------------------------------------------------------------------------------------------------------------------------------------------------------------------------------- Physical Exam BP 132/84   Pulse 78   Temp 98.2 F (36.8 C) (Oral)   Resp 18   Ht 5\' 3"  (1.6 m)   Wt 174 lb 9.6 oz (79.2 kg)   SpO2 97%   BMI 30.93 kg/m   Physical Exam Constitutional:      Appearance: Normal appearance.  HENT:     Head: Normocephalic and atraumatic.  Eyes:     General: No scleral icterus. Cardiovascular:     Rate and Rhythm: Normal rate and regular rhythm.  Pulmonary:     Effort: Pulmonary effort is normal.     Breath sounds: Normal breath sounds.  Skin:    Comments: Small blister along 4th digit of R foot. Faint Papular rash on dorsum of foot and along upper abdomen/lower chest. There are a few scattered papules on the arms as well.   Neurological:     General: No focal deficit present.     Mental Status: She is alert.  Psychiatric:        Mood and Affect: Mood normal.        Behavior: Behavior normal.      ------------------------------------------------------------------------------------------------------------------------------------------------------------------------------------------------------------------- Assessment and Plan  Rash -Rash with headache and recent tick bite concerning for RMSF. -Will go ahead and start doxycycline.   -Check RMSF titers.

## 2018-06-25 NOTE — Assessment & Plan Note (Addendum)
-  Rash with headache and recent tick bite concerning for RMSF. -Will go ahead and start doxycycline.   -Check RMSF titers.

## 2018-06-26 NOTE — Progress Notes (Signed)
If this is continuing lets have her try some cortisone cream to area.  If worsening let me know.

## 2018-07-02 ENCOUNTER — Other Ambulatory Visit: Payer: Self-pay

## 2018-07-03 ENCOUNTER — Encounter: Payer: Self-pay | Admitting: Gynecology

## 2018-07-03 ENCOUNTER — Ambulatory Visit (INDEPENDENT_AMBULATORY_CARE_PROVIDER_SITE_OTHER): Payer: PPO | Admitting: Gynecology

## 2018-07-03 VITALS — BP 124/82 | Ht 65.0 in | Wt 172.0 lb

## 2018-07-03 DIAGNOSIS — Z01419 Encounter for gynecological examination (general) (routine) without abnormal findings: Secondary | ICD-10-CM

## 2018-07-03 DIAGNOSIS — M8580 Other specified disorders of bone density and structure, unspecified site: Secondary | ICD-10-CM

## 2018-07-03 DIAGNOSIS — N952 Postmenopausal atrophic vaginitis: Secondary | ICD-10-CM

## 2018-07-03 NOTE — Progress Notes (Signed)
    Anna Marquez [birth year] [id number]        68 y.o.  G2P2 for breast and pelvic exam.  Without gynecologic complaints  Past medical history,surgical history, problem list, medications, allergies, family history and social history were all reviewed and documented as reviewed in the EPIC chart.  ROS:  Performed with pertinent positives and negatives included in the history, assessment and plan.   Additional significant findings : None   Exam: [name] assistant Vitals:   07/03/18 1001  BP: 124/82  Weight: 172 lb (78 kg)  Height: 5\' 5"  (1.651 m)   Body mass index is 28.62 kg/m.  General appearance:  Normal affect, orientation and appearance. Skin: Grossly normal HEENT: Without gross lesions.  No cervical or supraclavicular adenopathy. Thyroid normal.  Lungs:  Clear without wheezing, rales or rhonchi Cardiac: RR, without RMG Abdominal:  Soft, nontender, without masses, guarding, rebound, organomegaly or hernia Breasts:  Examined lying and sitting without masses, retractions, discharge or axillary adenopathy. Pelvic:  Ext, BUS, Vagina: With atrophic changes  Adnexa: Without masses or tenderness    Anus and perineum: Normal   Rectovaginal: Normal sphincter tone without palpated masses or tenderness.    Assessment/Plan:  68 y.o. G2P2 female for breast and pelvic exam.  Status post TVH anterior and posterior repair 2016  1. Postmenopausal.  No significant menopausal symptoms. 2. Mammography 02/2018.  Continue with annual mammography when due.  Breast exam normal today. 3. Colonoscopy 2015.  Repeat at their recommended interval. 4. Pap smear 2019.  No Pap smear done today.  No history of abnormal Pap smears.  Options to stop screening per current screening guidelines reviewed. 5. Osteopenia.  DEXA 03/2017 T score -1.5 FRAX 8.9% / 0.9%.  Stable from prior DEXA 2016.  Plan follow-up DEXA next year. 6. Health maintenance.  No routine lab work done as patient does this elsewhere.   Follow-up 1 year, sooner as needed.   Anastasio Auerbach MD, 10:21 AM 07/03/2018

## 2018-07-03 NOTE — Patient Instructions (Signed)
Follow-up in 1 year for annual exam, sooner if any issues. 

## 2018-07-06 DIAGNOSIS — Z20828 Contact with and (suspected) exposure to other viral communicable diseases: Secondary | ICD-10-CM | POA: Diagnosis not present

## 2018-09-24 ENCOUNTER — Telehealth: Payer: Self-pay

## 2018-09-24 NOTE — Telephone Encounter (Signed)

## 2018-09-25 ENCOUNTER — Ambulatory Visit (INDEPENDENT_AMBULATORY_CARE_PROVIDER_SITE_OTHER): Payer: PPO | Admitting: Family Medicine

## 2018-09-25 ENCOUNTER — Other Ambulatory Visit: Payer: Self-pay

## 2018-09-25 ENCOUNTER — Encounter: Payer: Self-pay | Admitting: Family Medicine

## 2018-09-25 ENCOUNTER — Ambulatory Visit: Payer: PPO | Admitting: Family Medicine

## 2018-09-25 VITALS — BP 124/80 | HR 90 | Ht 65.0 in | Wt 174.0 lb

## 2018-09-25 DIAGNOSIS — J4521 Mild intermittent asthma with (acute) exacerbation: Secondary | ICD-10-CM | POA: Diagnosis not present

## 2018-09-25 DIAGNOSIS — J301 Allergic rhinitis due to pollen: Secondary | ICD-10-CM

## 2018-09-25 DIAGNOSIS — J45909 Unspecified asthma, uncomplicated: Secondary | ICD-10-CM | POA: Insufficient documentation

## 2018-09-25 MED ORDER — PREDNISONE 10 MG (21) PO TBPK: ORAL_TABLET | ORAL | 0 refills | Status: DC

## 2018-09-25 NOTE — Patient Instructions (Signed)
Please go to [facility] to have your covid test done.

## 2018-09-25 NOTE — Progress Notes (Signed)
Established Patient Office Visit  Subjective:  Patient ID: Anna Marquez, female    DOB: [birth year]  Age: 68 y.o. MRN: [id number]  CC:  Chief Complaint  Patient presents with  . Sinus Problem    HPI Anna Marquez presents for evaluation and treatment of a 2 to 3-week history of nasal congestion postnasal drip, headache, cough, wheezing and sneezing.  She has been afebrile.  Tells that this was preceded by a gastroenteritis type illness with abdominal cramping and watery diarrhea that since resolved.  Past medical history of seasonal allergies and reactive airway disease.  Says that she has been using Flonase and Claritin daily since June.  She has never smoked.  Daughter is being evaluated for the COVID virus.  Past Medical History:  Diagnosis Date  . Adenomatous colon polyp 2003  . Cholelithiasis   . Diverticulosis of colon   . Gastric polyps   . GERD (gastroesophageal reflux disease)   . Helicobacter pylori (H. pylori)   . Hiatal hernia   . Hypertension   . Mitral valve prolapse   . Nephrolithiasis   . Osteoarthritis   . Osteopenia 03/2017   T score -1.5 FRAX 8.9% / 0.9%.  Stable from prior DEXA 2016  . TGA (transient global amnesia)     Past Surgical History:  Procedure Laterality Date  . ANTERIOR AND POSTERIOR REPAIR N/A 06/14/2014   Procedure: ANTERIOR (CYSTOCELE) AND POSTERIOR REPAIR (RECTOCELE);  Surgeon: Anastasio Auerbach, MD;  Location: [facility];  Service: Gynecology;  Laterality: N/A;  . CHOLECYSTECTOMY    . kidney stone removal     x2  . VAGINAL HYSTERECTOMY N/A 06/14/2014   Procedure: HYSTERECTOMY VAGINAL;  Surgeon: Anastasio Auerbach, MD;  Location: [facility];  Service: Gynecology;  Laterality: N/A;    Family History  Problem Relation Age of Onset  . Cancer Father        prostate  . Breast cancer Paternal Aunt 71  . Diabetes Maternal Grandmother   . Heart disease Maternal Grandmother   . Heart disease Maternal Grandfather   . Melanoma Paternal Aunt   .  Neuropathy Mother   . Congestive Heart Failure Mother     Social History   Socioeconomic History  . Marital status: Divorced    Spouse name: Not on file  . Number of children: Not on file  . Years of education: Not on file  . Highest education level: Not on file  Occupational History  . Not on file  Social Needs  . Financial resource strain: Not on file  . Food insecurity    Worry: Not on file    Inability: Not on file  . Transportation needs    Medical: Not on file    Non-medical: Not on file  Tobacco Use  . Smoking status: Never Smoker  . Smokeless tobacco: Never Used  Substance and Sexual Activity  . Alcohol use: Yes    Alcohol/week: 0.0 standard drinks    Comment: Occas  . Drug use: No  . Sexual activity: Yes    Birth control/protection: Post-menopausal    Comment: 1st intercourse 68 yo-Fewer than 5 partners  Lifestyle  . Physical activity    Days per week: Not on file    Minutes per session: Not on file  . Stress: Not on file  Relationships  . Social Herbalist on phone: Not on file    Gets together: Not on file    Attends religious service: Not on  file    Active member of club or organization: Not on file    Attends meetings of clubs or organizations: Not on file    Relationship status: Not on file  . Intimate partner violence    Fear of current or ex partner: Not on file    Emotionally abused: Not on file    Physically abused: Not on file    Forced sexual activity: Not on file  Other Topics Concern  . Not on file  Social History Narrative  . Not on file    Outpatient Medications Prior to Visit  Medication Sig Dispense Refill  . albuterol (PROVENTIL HFA;VENTOLIN HFA) 108 (90 Base) MCG/ACT inhaler Inhale 1-2 puffs into the lungs every 6 (six) hours as needed for wheezing or shortness of breath. 1 Inhaler 0  . Ascorbic Acid (VITAMIN C) 1000 MG tablet Take 1,000 mg by mouth daily.    . Cholecalciferol (VITAMIN D) 50 MCG (2000 UT) tablet Take  2,000 Units by mouth daily.    . diphenhydrAMINE-APAP, sleep, (TYLENOL PM EXTRA STRENGTH PO) Take 2 tablets by mouth at bedtime as needed (pain).    [name] ELIQUIS 5 MG TABS tablet TAKE 1 TABLET BY MOUTH 2 TIMES DAILY. 60 tablet 6  . fluticasone (FLONASE) 50 MCG/ACT nasal spray Place 1 spray into both nostrils as needed for allergies or rhinitis.    . hydrochlorothiazide (HYDRODIURIL) 25 MG tablet Take 1 tablet (25 mg total) by mouth daily as needed (weight gain/swelling). 30 tablet 6  . ibuprofen (ADVIL,MOTRIN) 200 MG tablet Take 400 mg by mouth every 6 (six) hours as needed for mild pain.    [name] lisinopril (PRINIVIL,ZESTRIL) 10 MG tablet Take 1 tablet (10 mg total) by mouth daily. 100 tablet 3  . loratadine (CLARITIN) 10 MG tablet Take 10 mg by mouth as needed.     . nadolol (CORGARD) 40 MG tablet TAKE 1 TABLET BY MOUTH TWICE DAILY. 180 tablet 4  . pantoprazole (PROTONIX) 40 MG tablet Take 1 tablet (40 mg total) by mouth daily. 90 tablet 4  . potassium chloride (K-DUR) 10 MEQ tablet Take 1 tablet (10 mEq total) by mouth daily as needed (take with HCTZ). 30 tablet 6   No facility-administered medications prior to visit.     Allergies  Allergen Reactions  . Erythromycin Nausea And Vomiting and Other (See Comments)    REACTION: Stomach upset  . Penicillins Hives and Shortness Of Breath    Has patient had a PCN reaction causing immediate rash, facial/tongue/throat swelling, SOB or lightheadedness with hypotension: yes Has patient had a PCN reaction causing severe rash involving mucus membranes or skin necrosis: no Has patient had a PCN reaction that required hospitalization: unknown Has patient had a PCN reaction occurring within the last 10 years: no If all of the above answers are "NO", then may proceed with Cephalosporin use.   . Ibuprofen Other (See Comments)    Pt states allergy to NSAIDS is secondary to ulcers NOT anaphylaxis. Tolerates daily 325 ASA at home    ROS Review of Systems   Constitutional: Negative for chills, diaphoresis, fatigue, fever and unexpected weight change.  HENT: Positive for congestion, postnasal drip, rhinorrhea, sinus pressure and sneezing. Negative for trouble swallowing.   Eyes: Negative for photophobia and visual disturbance.  Respiratory: Positive for cough and wheezing. Negative for chest tightness.   Cardiovascular: Negative.   Gastrointestinal: Negative.   Endocrine: Negative for polyphagia and polyuria.  Genitourinary: Negative.   Musculoskeletal: Negative for arthralgias and  myalgias.  Skin: Negative for rash.  Allergic/Immunologic: Negative for immunocompromised state.  Neurological: Positive for headaches. Negative for syncope and speech difficulty.  Hematological: Does not bruise/bleed easily.  Psychiatric/Behavioral: Negative.       Objective:    Physical Exam  Constitutional: She is oriented to person, place, and time. She appears well-developed and well-nourished. No distress.  HENT:  Head: Normocephalic and atraumatic.  Right Ear: External ear normal.  Left Ear: External ear normal.  Mouth/Throat: Oropharynx is clear and moist. No oropharyngeal exudate.  Eyes: Pupils are equal, round, and reactive to light. Conjunctivae are normal. Right eye exhibits no discharge. Left eye exhibits no discharge. No scleral icterus.  Neck: Neck supple. No JVD present. No tracheal deviation present. No thyromegaly present.  Cardiovascular: Normal rate, regular rhythm and normal heart sounds.  Pulmonary/Chest: Effort normal. No stridor. No respiratory distress. She has wheezes. She has no rales.  Abdominal: Bowel sounds are normal.  Musculoskeletal:        General: No edema.  Lymphadenopathy:    She has no cervical adenopathy.  Neurological: She is alert and oriented to person, place, and time.  Skin: Skin is warm and dry. She is not diaphoretic.  Psychiatric: She has a normal mood and affect. Her behavior is normal.    BP 124/80    Pulse 90   Ht 5\' 5"  (1.651 m)   Wt 174 lb (78.9 kg)   SpO2 92%   BMI 28.96 kg/m  Wt Readings from Last 3 Encounters:  09/25/18 174 lb (78.9 kg)  07/03/18 172 lb (78 kg)  06/22/18 174 lb 9.6 oz (79.2 kg)   BP Readings from Last 3 Encounters:  09/25/18 124/80  07/03/18 124/82  06/22/18 132/84   Guideline developer:  UpToDate (see UpToDate for funding source) Date Released: June 2014  Health Maintenance Due  Topic Date Due  . Hepatitis C Screening  December 27, 1950  . INFLUENZA VACCINE  08/08/2018    There are no preventive care reminders to display for this patient.  Lab Results  Component Value Date   TSH 0.409 03/17/2018   Lab Results  Component Value Date   WBC 5.5 06/22/2018   HGB 12.7 06/22/2018   HCT 36.2 06/22/2018   MCV 86.6 06/22/2018   PLT 173.0 06/22/2018   Lab Results  Component Value Date   NA 141 06/22/2018   K 3.4 (L) 06/22/2018   CO2 28 06/22/2018   GLUCOSE 90 06/22/2018   BUN 13 06/22/2018   CREATININE 0.86 06/22/2018   BILITOT 1.0 06/22/2018   ALKPHOS 44 06/22/2018   AST 11 06/22/2018   ALT 9 06/22/2018   PROT 6.7 06/22/2018   ALBUMIN 4.2 06/22/2018   CALCIUM 9.5 06/22/2018   ANIONGAP 4 (L) 03/18/2018   GFR 65.35 06/22/2018   Lab Results  Component Value Date   CHOL 194 10/28/2017   Lab Results  Component Value Date   HDL 57.10 10/28/2017   Lab Results  Component Value Date   LDLCALC 103 (H) 10/28/2017   Lab Results  Component Value Date   TRIG 170.0 (H) 10/28/2017   Lab Results  Component Value Date   CHOLHDL 3 10/28/2017   Lab Results  Component Value Date   HGBA1C 5.1 08/21/2015      Assessment & Plan:   Problem List Items Addressed This Visit      Respiratory   Seasonal allergic rhinitis due to pollen - Primary   Relevant Medications   predniSONE (STERAPRED UNI-PAK 21 TAB)  10 MG (21) TBPK tablet   Reactive airway disease   Relevant Medications   predniSONE (STERAPRED UNI-PAK 21 TAB) 10 MG (21) TBPK tablet       Meds ordered this encounter  Medications  . predniSONE (STERAPRED UNI-PAK 21 TAB) 10 MG (21) TBPK tablet    Sig: Take 6 today, 5 tomorrow, 4 the next day and then 3, 2, 1 and stop    Dispense:  21 tablet    Refill:  0    Follow-up: Return in about 1 week (around 10/02/2018), or if symptoms worsen or fail to improve, for recommend COVID testing.   Advised patient to go for COVID testing.

## 2018-10-14 ENCOUNTER — Encounter: Payer: Self-pay | Admitting: Gynecology

## 2018-10-16 ENCOUNTER — Other Ambulatory Visit: Payer: Self-pay

## 2018-10-16 DIAGNOSIS — Z20828 Contact with and (suspected) exposure to other viral communicable diseases: Secondary | ICD-10-CM | POA: Diagnosis not present

## 2018-10-16 DIAGNOSIS — Z20822 Contact with and (suspected) exposure to covid-19: Secondary | ICD-10-CM

## 2018-10-17 LAB — NOVEL CORONAVIRUS, NAA: SARS-CoV-2, NAA: NOT DETECTED

## 2018-11-03 ENCOUNTER — Telehealth: Payer: Self-pay

## 2018-11-03 NOTE — Telephone Encounter (Signed)
Questions for Screening COVID-19  Symptom onset: N/a  Travel or Contacts: No  During this illness, did/does the patient experience any of the following symptoms? Fever >100.4F []  Yes [x]  No []  Unknown Subjective fever (felt feverish) []  Yes [x]  No []  Unknown Chills []  Yes [x]  No []  Unknown Muscle aches (myalgia) []  Yes [x]  No []  Unknown Runny nose (rhinorrhea) []  Yes [x]  No []  Unknown Sore throat []  Yes [x]  No []  Unknown Cough (new onset or worsening of chronic cough) []  Yes [x]  No []  Unknown Shortness of breath (dyspnea) []  Yes [x]  No []  Unknown Nausea or vomiting []  Yes [x]  No []  Unknown Headache []  Yes [x]  No []  Unknown Abdominal pain  []  Yes [x]  No []  Unknown Diarrhea (?3 loose/looser than normal stools/24hr period) []  Yes [x]  No []  Unknown  

## 2018-11-04 ENCOUNTER — Other Ambulatory Visit: Payer: Self-pay | Admitting: Family Medicine

## 2018-11-04 ENCOUNTER — Encounter: Payer: Self-pay | Admitting: Family Medicine

## 2018-11-04 ENCOUNTER — Ambulatory Visit (INDEPENDENT_AMBULATORY_CARE_PROVIDER_SITE_OTHER): Payer: PPO | Admitting: Family Medicine

## 2018-11-04 ENCOUNTER — Other Ambulatory Visit: Payer: Self-pay

## 2018-11-04 VITALS — BP 132/98 | HR 76 | Temp 98.0°F | Ht 65.0 in | Wt 171.0 lb

## 2018-11-04 DIAGNOSIS — I480 Paroxysmal atrial fibrillation: Secondary | ICD-10-CM

## 2018-11-04 DIAGNOSIS — J4521 Mild intermittent asthma with (acute) exacerbation: Secondary | ICD-10-CM

## 2018-11-04 DIAGNOSIS — Z13220 Encounter for screening for lipoid disorders: Secondary | ICD-10-CM | POA: Diagnosis not present

## 2018-11-04 DIAGNOSIS — Z23 Encounter for immunization: Secondary | ICD-10-CM

## 2018-11-04 DIAGNOSIS — I10 Essential (primary) hypertension: Secondary | ICD-10-CM | POA: Diagnosis not present

## 2018-11-04 DIAGNOSIS — Z1159 Encounter for screening for other viral diseases: Secondary | ICD-10-CM | POA: Diagnosis not present

## 2018-11-04 LAB — COMPREHENSIVE METABOLIC PANEL
ALT: 11 U/L (ref 0–35)
AST: 13 U/L (ref 0–37)
Albumin: 4.5 g/dL (ref 3.5–5.2)
Alkaline Phosphatase: 50 U/L (ref 39–117)
BUN: 15 mg/dL (ref 6–23)
CO2: 28 mEq/L (ref 19–32)
Calcium: 9.9 mg/dL (ref 8.4–10.5)
Chloride: 103 mEq/L (ref 96–112)
Creatinine, Ser: 1.03 mg/dL (ref 0.40–1.20)
GFR: 53.01 mL/min — ABNORMAL LOW (ref >60.00)
Glucose, Bld: 98 mg/dL (ref 70–99)
Potassium: 3.3 mEq/L — ABNORMAL LOW (ref 3.5–5.1)
Sodium: 141 mEq/L (ref 135–145)
Total Bilirubin: 2.2 mg/dL — ABNORMAL HIGH (ref 0.2–1.2)
Total Protein: 7.1 g/dL (ref 6.0–8.3)

## 2018-11-04 LAB — MAGNESIUM: Magnesium: 1.7 mg/dL (ref 1.5–2.5)

## 2018-11-04 LAB — CBC
HCT: 40.3 % (ref 36.0–46.0)
Hemoglobin: 13.9 g/dL (ref 12.0–15.0)
MCHC: 34.6 g/dL (ref 30.0–36.0)
MCV: 89 fl (ref 78.0–100.0)
Platelets: 211 10*3/uL (ref 150.0–400.0)
RBC: 4.53 Mil/uL (ref 3.87–5.11)
RDW: 13.5 % (ref 11.5–15.5)
WBC: 7.1 10*3/uL (ref 4.0–10.5)

## 2018-11-04 LAB — LDL CHOLESTEROL, DIRECT: Direct LDL: 100 mg/dL

## 2018-11-04 LAB — LIPID PANEL
Cholesterol: 184 mg/dL (ref 0–200)
HDL: 50.6 mg/dL (ref >39.00)
NonHDL: 133.12
Total CHOL/HDL Ratio: 4
Triglycerides: 259 mg/dL — ABNORMAL HIGH (ref 0.0–149.0)
VLDL: 51.8 mg/dL — ABNORMAL HIGH (ref 0.0–40.0)

## 2018-11-04 MED ORDER — MONTELUKAST SODIUM 10 MG PO TABS: 10.0000 mg | ORAL_TABLET | Freq: Every day | ORAL | 3 refills | Status: DC

## 2018-11-04 NOTE — Telephone Encounter (Signed)
Medication Refill - Medication: flonase, corguard, lisinopril, proair, pantoprazole  Has the patient contacted their pharmacy? No. Pt states she is needing new scripts for the medication.  (Agent: If no, request that the patient contact the pharmacy for the refill.) (Agent: If yes, when and what did the pharmacy advise?)  Preferred Pharmacy (with phone number or street name):  [facility] [address]  [place] [address]  Phone: [phone] Fax: [phone]  Not a 24 hour pharmacy; exact hours not known.     Agent: Please be advised that RX refills may take up to 3 business days. We ask that you follow-up with your pharmacy.

## 2018-11-04 NOTE — Progress Notes (Signed)
Anna Marquez - 68 y.o. female MRN [id number]  Date of birth: Jul 12, 1950  Subjective Chief Complaint  Patient presents with  . Follow-up    patient is still having some breathing problems, headache. Lab    HPI Anna Marquez is a 68 y.o. female with history of PAF, HTN, and seasonal allergies here today with complaint congestion with mild headaches with some intermittent shortness of breath and wheezing.  She would also like to have updated labs today.  She reports that current symptoms started a few days ago.  She has had some improvement with albuterol, claritin and flonase.  She denies fever, chills, nausea, vomiting, diarrhea, cough, sinus pain.  She reports that she has been in A. Fib for the past couple of days as well but denies feeling of dizziness, fatigue and feeling of shortness of breath started before she was in a. Fib.  She has been compliant with medications.    ROS:  A comprehensive ROS was completed and negative except as noted per HPI  Allergies  Allergen Reactions  . Erythromycin Nausea And Vomiting and Other (See Comments)    REACTION: Stomach upset  . Penicillins Hives and Shortness Of Breath    Has patient had a PCN reaction causing immediate rash, facial/tongue/throat swelling, SOB or lightheadedness with hypotension: yes Has patient had a PCN reaction causing severe rash involving mucus membranes or skin necrosis: no Has patient had a PCN reaction that required hospitalization: unknown Has patient had a PCN reaction occurring within the last 10 years: no If all of the above answers are "NO", then may proceed with Cephalosporin use.   . Ibuprofen Other (See Comments)    Pt states allergy to NSAIDS is secondary to ulcers NOT anaphylaxis. Tolerates daily 325 ASA at home    Past Medical History:  Diagnosis Date  . Adenomatous colon polyp 2003  . Cholelithiasis   . Diverticulosis of colon   . Gastric polyps   . GERD (gastroesophageal reflux disease)   .  Helicobacter pylori (H. pylori)   . Hiatal hernia   . Hypertension   . Mitral valve prolapse   . Nephrolithiasis   . Osteoarthritis   . Osteopenia 03/2017   T score -1.5 FRAX 8.9% / 0.9%.  Stable from prior DEXA 2016  . TGA (transient global amnesia)     Past Surgical History:  Procedure Laterality Date  . ANTERIOR AND POSTERIOR REPAIR N/A 06/14/2014   Procedure: ANTERIOR (CYSTOCELE) AND POSTERIOR REPAIR (RECTOCELE);  Surgeon: Anastasio Auerbach, MD;  Location: [facility];  Service: Gynecology;  Laterality: N/A;  . CHOLECYSTECTOMY    . kidney stone removal     x2  . VAGINAL HYSTERECTOMY N/A 06/14/2014   Procedure: HYSTERECTOMY VAGINAL;  Surgeon: Anastasio Auerbach, MD;  Location: [facility];  Service: Gynecology;  Laterality: N/A;    Social History   Socioeconomic History  . Marital status: Divorced    Spouse name: Not on file  . Number of children: Not on file  . Years of education: Not on file  . Highest education level: Not on file  Occupational History  . Not on file  Social Needs  . Financial resource strain: Not on file  . Food insecurity    Worry: Not on file    Inability: Not on file  . Transportation needs    Medical: Not on file    Non-medical: Not on file  Tobacco Use  . Smoking status: Never Smoker  . Smokeless tobacco: Never  Used  Substance and Sexual Activity  . Alcohol use: Yes    Alcohol/week: 0.0 standard drinks    Comment: Occas  . Drug use: No  . Sexual activity: Yes    Birth control/protection: Post-menopausal    Comment: 1st intercourse 68 yo-Fewer than 5 partners  Lifestyle  . Physical activity    Days per week: Not on file    Minutes per session: Not on file  . Stress: Not on file  Relationships  . Social Herbalist on phone: Not on file    Gets together: Not on file    Attends religious service: Not on file    Active member of club or organization: Not on file    Attends meetings of clubs or organizations: Not on file     Relationship status: Not on file  Other Topics Concern  . Not on file  Social History Narrative  . Not on file    Family History  Problem Relation Age of Onset  . Cancer Father        prostate  . Breast cancer Paternal Aunt 56  . Diabetes Maternal Grandmother   . Heart disease Maternal Grandmother   . Heart disease Maternal Grandfather   . Melanoma Paternal Aunt   . Neuropathy Mother   . Congestive Heart Failure Mother     Health Maintenance  Topic Date Due  . Hepatitis C Screening  Dec 18, 1950  . MAMMOGRAM  03/03/2020  . COLONOSCOPY  03/13/2023  . TETANUS/TDAP  10/29/2027  . INFLUENZA VACCINE  Completed  . DEXA SCAN  Completed  . PNA vac Low Risk Adult  Completed    ----------------------------------------------------------------------------------------------------------------------------------------------------------------------------------------------------------------- Physical Exam BP (!) 132/98   Pulse 76   Temp 98 F (36.7 C) (Tympanic)   Ht 5\' 5"  (1.651 m)   Wt 171 lb (77.6 kg)   SpO2 97%   BMI 28.46 kg/m   Physical Exam Constitutional:      Appearance: Normal appearance.  HENT:     Head: Normocephalic and atraumatic.     Right Ear: Tympanic membrane normal.     Left Ear: Tympanic membrane normal.     Nose: Congestion present.     Mouth/Throat:     Mouth: Mucous membranes are moist.  Eyes:     General: No scleral icterus. Neck:     Musculoskeletal: Neck supple.  Cardiovascular:     Comments: IRR Pulmonary:     Effort: Pulmonary effort is normal.     Breath sounds: Normal breath sounds.  Musculoskeletal:     Right lower leg: No edema.     Left lower leg: No edema.  Skin:    General: Skin is warm and dry.  Neurological:     General: No focal deficit present.     Mental Status: She is alert.  Psychiatric:        Mood and Affect: Mood normal.        Behavior: Behavior normal.      ------------------------------------------------------------------------------------------------------------------------------------------------------------------------------------------------------------------- Assessment and Plan  Paroxysmal atrial fibrillation (HCC) -Currently in a. Fib but rate is controlled and she is asymptomatic from this at this time. I think her shortness of breath is related to allergies/asthma.  -Discussed red flags including worsening chest pain, shortness of breath, dizziness, fatigue.  Reviewed she should seek emergency care for these symptoms.    Essential hypertension -Continue current medications -Update labs today.   Screening for lipid disorders -Check lipid panel with history of HTN/a. fib  Reactive  airway disease shortness of breath seems to be related to asthma and reactive airway disease. Continue current medications with addition of singulair.

## 2018-11-04 NOTE — Patient Instructions (Signed)
It was great to see you today! Try the singulair, continue claritin and flonase We'll be in touch with lab results and any additional recommendations.  Please continue to stay safe!

## 2018-11-04 NOTE — Telephone Encounter (Signed)
Requested medication (s) are due for refill today: yes  Requested medication (s) are on the active medication list: yes  Last refill:  Last filled by Dr. Sherren Mocha and historical provider  Future visit scheduled: Office visit on today, 11/04/18  Notes to clinic:  Medications previously prescribed by a different provider    Requested Prescriptions  Pending Prescriptions Disp Refills   nadolol (CORGARD) 40 MG tablet 180 tablet 4    Sig: TAKE 1 TABLET BY MOUTH TWICE DAILY.     Cardiovascular:  Beta Blockers Failed - 11/04/2018  3:28 PM      Failed - Last BP in normal range    BP Readings from Last 1 Encounters:  11/04/18 (!) 132/98         Passed - Last Heart Rate in normal range    Pulse Readings from Last 1 Encounters:  11/04/18 76         Passed - Valid encounter within last 6 months    Recent Outpatient Visits          Today Need for influenza vaccination   [facility] [place], [place], DO   1 month ago Seasonal allergic rhinitis due to pollen   [facility] [place], Mortimer Fries, MD   4 months ago Rash   [facility] [place], [place], [place]   7 months ago Hypomagnesemia   [facility] [place], [place], DO   7 months ago Atrial fibrillation with [facility])   [facility] [place] [facility], [place], DO              lisinopril (ZESTRIL) 10 MG tablet 100 tablet 3    Sig: Take 1 tablet (10 mg total) by mouth daily.     Cardiovascular:  ACE Inhibitors Failed - 11/04/2018  3:28 PM      Failed - K in normal range and within 180 days    Potassium  Date Value Ref Range Status  06/22/2018 3.4 (L) 3.5 - 5.1 mEq/L Final         Failed - Last BP in normal range    BP Readings from Last 1 Encounters:  11/04/18 (!) 132/98         Passed - Cr in normal range and within 180 days    Creatinine, Ser  Date Value Ref Range Status  06/22/2018 0.86 0.40 - 1.20 mg/dL Final         Passed -  Patient is not pregnant      Passed - Valid encounter within last 6 months    Recent Outpatient Visits          Today Need for influenza vaccination   [facility] [place], [place], DO   1 month ago Seasonal allergic rhinitis due to pollen   [facility] [place], Mortimer Fries, MD   4 months ago Rash   [facility] [place] Matthews, [place], [place]   7 months ago Hypomagnesemia   [facility] [place] Matthews, [place], DO   7 months ago Atrial fibrillation with [facility])   [facility] [place] Matthews, [place], DO              albuterol (VENTOLIN HFA) 108 (90 Base) MCG/ACT inhaler      Sig: Inhale 1-2 puffs into the lungs every 6 (six) hours as needed for wheezing or shortness of breath.     Pulmonology:  Beta Agonists Failed - 11/04/2018  3:28 PM  Failed - One inhaler should last at least one month. If the patient is requesting refills earlier, contact the patient to check for uncontrolled symptoms.      Passed - Valid encounter within last 12 months    Recent Outpatient Visits          Today Need for influenza vaccination   [facility] [place] Matthews, [place], DO   1 month ago Seasonal allergic rhinitis due to pollen   [facility] [place], Mortimer Fries, MD   4 months ago Rash   [facility] [place] Matthews, [place], [place]   7 months ago Hypomagnesemia   [facility] [place], [place], DO   7 months ago Atrial fibrillation with [facility])   [facility] [place] [facility], [place], DO              fluticasone (FLONASE) 50 MCG/ACT nasal spray       Sig: Place 1 spray into both nostrils as needed for allergies or rhinitis.     Ear, Nose, and Throat: Nasal Preparations - Corticosteroids Passed - 11/04/2018  3:28 PM      Passed - Valid encounter within last 12 months    Recent Outpatient Visits          Today Need for influenza  vaccination   [facility] [place] Matthews, [place], DO   1 month ago Seasonal allergic rhinitis due to pollen   [facility] [place] Hal, Mortimer Fries, MD   4 months ago Rash   [facility] [place] Matthews, [place], [place]   7 months ago Hypomagnesemia   [facility] [place], [place], DO   7 months ago Atrial fibrillation with [facility])   [facility] [place], [place], DO              pantoprazole (PROTONIX) 40 MG tablet 90 tablet 4    Sig: Take 1 tablet (40 mg total) by mouth daily.     Gastroenterology: Proton Pump Inhibitors Passed - 11/04/2018  3:28 PM      Passed - Valid encounter within last 12 months    Recent Outpatient Visits          Today Need for influenza vaccination   [facility] [place], [place], DO   1 month ago Seasonal allergic rhinitis due to pollen   [facility] [place] Hal, Mortimer Fries, MD   4 months ago Rash   [facility] [place], [place]   7 months ago Hypomagnesemia   [facility] [place], [place]   7 months ago Atrial fibrillation with [facility])   [facility] [place], [place], [place]

## 2018-11-05 ENCOUNTER — Encounter (HOSPITAL_COMMUNITY): Payer: Self-pay | Admitting: Emergency Medicine

## 2018-11-05 ENCOUNTER — Emergency Department (HOSPITAL_COMMUNITY)
Admission: EM | Admit: 2018-11-05 | Discharge: 2018-11-06 | Disposition: A | Discharge status: Home / Self Care | Payer: PPO | Attending: Emergency Medicine | Admitting: Emergency Medicine

## 2018-11-05 ENCOUNTER — Other Ambulatory Visit: Payer: Self-pay

## 2018-11-05 DIAGNOSIS — R002 Palpitations: Secondary | ICD-10-CM | POA: Diagnosis not present

## 2018-11-05 DIAGNOSIS — I4891 Unspecified atrial fibrillation: Secondary | ICD-10-CM

## 2018-11-05 DIAGNOSIS — I10 Essential (primary) hypertension: Secondary | ICD-10-CM | POA: Insufficient documentation

## 2018-11-05 DIAGNOSIS — I480 Paroxysmal atrial fibrillation: Secondary | ICD-10-CM | POA: Insufficient documentation

## 2018-11-05 DIAGNOSIS — R4020 Unspecified coma: Secondary | ICD-10-CM | POA: Diagnosis not present

## 2018-11-05 DIAGNOSIS — Z4502 Encounter for adjustment and management of automatic implantable cardiac defibrillator: Secondary | ICD-10-CM | POA: Diagnosis not present

## 2018-11-05 DIAGNOSIS — Z0189 Encounter for other specified special examinations: Secondary | ICD-10-CM

## 2018-11-05 DIAGNOSIS — R42 Dizziness and giddiness: Secondary | ICD-10-CM | POA: Diagnosis not present

## 2018-11-05 DIAGNOSIS — Z79899 Other long term (current) drug therapy: Secondary | ICD-10-CM | POA: Insufficient documentation

## 2018-11-05 DIAGNOSIS — E860 Dehydration: Secondary | ICD-10-CM | POA: Diagnosis not present

## 2018-11-05 DIAGNOSIS — Z20828 Contact with and (suspected) exposure to other viral communicable diseases: Secondary | ICD-10-CM | POA: Diagnosis not present

## 2018-11-05 LAB — CBC WITH DIFFERENTIAL/PLATELET
Abs Immature Granulocytes: 0.01 10*3/uL (ref 0.00–0.07)
Basophils Absolute: 0.1 10*3/uL (ref 0.0–0.1)
Basophils Relative: 1 %
Eosinophils Absolute: 0.1 10*3/uL (ref 0.0–0.5)
Eosinophils Relative: 2 %
HCT: 35.7 % — ABNORMAL LOW (ref 36.0–46.0)
Hemoglobin: 12.5 g/dL (ref 12.0–15.0)
Immature Granulocytes: 0 %
Lymphocytes Relative: 32 %
Lymphs Abs: 1.9 10*3/uL (ref 0.7–4.0)
MCH: 30.9 pg (ref 26.0–34.0)
MCHC: 35 g/dL (ref 30.0–36.0)
MCV: 88.1 fL (ref 80.0–100.0)
Monocytes Absolute: 0.5 10*3/uL (ref 0.1–1.0)
Monocytes Relative: 7 %
Neutro Abs: 3.5 10*3/uL (ref 1.7–7.7)
Neutrophils Relative %: 58 %
Platelets: 159 10*3/uL (ref 150–400)
RBC: 4.05 MIL/uL (ref 3.87–5.11)
RDW: 12.8 % (ref 11.5–15.5)
WBC: 6.1 10*3/uL (ref 4.0–10.5)
nRBC: 0 % (ref 0.0–0.2)

## 2018-11-05 LAB — PROTIME-INR
INR: 1.1 (ref 0.8–1.2)
Prothrombin Time: 14.5 seconds (ref 11.4–15.2)

## 2018-11-05 LAB — COMPREHENSIVE METABOLIC PANEL
ALT: 18 U/L (ref 0–44)
AST: 23 U/L (ref 15–41)
Albumin: 3.7 g/dL (ref 3.5–5.0)
Alkaline Phosphatase: 41 U/L (ref 38–126)
Anion gap: 10 (ref 5–15)
BUN: 17 mg/dL (ref 8–23)
CO2: 21 mmol/L — ABNORMAL LOW (ref 22–32)
Calcium: 9 mg/dL (ref 8.9–10.3)
Chloride: 108 mmol/L (ref 98–111)
Creatinine, Ser: 0.93 mg/dL (ref 0.44–1.00)
GFR calc Af Amer: >60 mL/min (ref >60)
GFR calc non Af Amer: >60 mL/min (ref >60)
Glucose, Bld: 89 mg/dL (ref 70–99)
Potassium: 3.6 mmol/L (ref 3.5–5.1)
Sodium: 139 mmol/L (ref 135–145)
Total Bilirubin: 2.4 mg/dL — ABNORMAL HIGH (ref 0.3–1.2)
Total Protein: 6.3 g/dL — ABNORMAL LOW (ref 6.5–8.1)

## 2018-11-05 LAB — MAGNESIUM: Magnesium: 1.7 mg/dL (ref 1.7–2.4)

## 2018-11-05 LAB — HEPATITIS C ANTIBODY
Hepatitis C Ab: NONREACTIVE
SIGNAL TO CUT-OFF: 0.01 (ref <1.00)

## 2018-11-05 LAB — TSH: TSH: 0.2 u[IU]/mL — ABNORMAL LOW (ref 0.35–4.50)

## 2018-11-05 MED ORDER — MAGNESIUM SULFATE 2 GM/50ML IV SOLN
2.0000 g | Freq: Once | INTRAVENOUS | Status: AC
  Administered 2018-11-05: 20:00:00 2 g via INTRAVENOUS
  Filled 2018-11-05: qty 50

## 2018-11-05 MED ORDER — LISINOPRIL 10 MG PO TABS: 10.0000 mg | ORAL_TABLET | Freq: Every day | ORAL | 3 refills | Status: DC

## 2018-11-05 MED ORDER — LACTATED RINGERS IV BOLUS
1000.0000 mL | Freq: Once | INTRAVENOUS | Status: AC
  Administered 2018-11-05: 1000 mL via INTRAVENOUS

## 2018-11-05 MED ORDER — FLUTICASONE PROPIONATE 50 MCG/ACT NA SUSP: 1.0000 | NASAL | 6 refills | Status: DC | PRN

## 2018-11-05 MED ORDER — ALBUTEROL SULFATE HFA 108 (90 BASE) MCG/ACT IN AERS: 1.0000 | INHALATION_SPRAY | Freq: Four times a day (QID) | RESPIRATORY_TRACT | 3 refills | Status: DC | PRN

## 2018-11-05 MED ORDER — NADOLOL 40 MG PO TABS: ORAL_TABLET | ORAL | 4 refills | Status: DC

## 2018-11-05 MED ORDER — PROPOFOL 10 MG/ML IV BOLUS
1.0000 mg/kg | Freq: Once | INTRAVENOUS | Status: AC
  Administered 2018-11-05: 77.6 mg via INTRAVENOUS
  Filled 2018-11-05: qty 20

## 2018-11-05 MED ORDER — PANTOPRAZOLE SODIUM 40 MG PO TBEC: 40.0000 mg | DELAYED_RELEASE_TABLET | Freq: Every day | ORAL | 4 refills | Status: DC

## 2018-11-05 MED ORDER — NADOLOL 40 MG PO TABS: 40.0000 mg | ORAL_TABLET | Freq: Every day | ORAL | Status: DC

## 2018-11-05 NOTE — ED Provider Notes (Signed)
[facility] EMERGENCY DEPARTMENT Provider Note   CSN: [id number] Arrival date & time: 11/05/18  1645     History   Chief Complaint No chief complaint on file.   HPI Anna Marquez is a 68 y.o. female.     The history is provided by the patient.  Palpitations Palpitations quality:  Irregular Onset quality:  Gradual Duration:  5 days Timing:  Constant Progression:  Worsening Chronicity:  Recurrent Context: caffeine (3 pepsis and lots of green tea a day) and dehydration   Relieved by:  Nothing Worsened by:  Nothing Ineffective treatments:  Beta blockers and bed rest Associated symptoms: dizziness and shortness of breath (known asthma)   Associated symptoms: no cough, no lower extremity edema, no nausea, no syncope and no vomiting   Associated symptoms comment:  Chest muscle aches from a hernia Risk factors: hx of atrial fibrillation and OTC sinus medications (claritin and singular, started yesterday)   Risk factors: no hx of DVT and no hx of PE    Patient started Singulair yesterday and she received her flu shot yesterday.  She did not take her beta-blocker last night, but she did take it this morning.  She did not take it last night due to chronic blood pressure.  She has not missed any doses of her Eliquis.   Patient reports that her a fib started in January 2020, at which time she was cardioverted electrically without success, and then reverted back to sinus rhythm spontaneously during a hospital admission. She has been on her beta blocker for decades.  Past Medical History:  Diagnosis Date  . Adenomatous colon polyp 2003  . Cholelithiasis   . Diverticulosis of colon   . Gastric polyps   . GERD (gastroesophageal reflux disease)   . Helicobacter pylori (H. pylori)   . Hiatal hernia   . Hypertension   . Mitral valve prolapse   . Nephrolithiasis   . Osteoarthritis   . Osteopenia 03/2017   T score -1.5 FRAX 8.9% / 0.9%.  Stable from prior DEXA 2016   . TGA (transient global amnesia)     Patient Active Problem List   Diagnosis Date Noted  . Seasonal allergic rhinitis due to pollen 09/25/2018  . Reactive airway disease 09/25/2018  . Rash 06/25/2018  . Hypomagnesemia 03/25/2018  . Viral upper respiratory tract infection 03/16/2018  . Anemia 02/04/2018  . Recurrent nephrolithiasis 02/04/2018  . Adhesive capsulitis 02/04/2018  . Routine general medical examination at a health care facility 10/28/2017  . Acute cystitis 10/22/2017  . Palpitations 01/31/2017  . Cough 11/21/2016  . Hypokalemia 03/07/2016  . TIA (transient ischemic attack) 03/07/2016  . Hematuria 12/06/2015  . Essential hypertension 01/27/2012  . Paroxysmal atrial fibrillation ([place]) 01/09/2012  . Migraine without aura 05/20/2008  . Allergic rhinitis 05/20/2008  . Mitral regurgitation 08/10/2007  . GERD 08/10/2007  . Diaphragmatic hernia 08/10/2007  . DIVERTICULOSIS, COLON 08/10/2007  . Osteoarthritis 08/10/2007  . COLONIC POLYPS, ADENOMATOUS, HX OF 08/10/2007    Past Surgical History:  Procedure Laterality Date  . ANTERIOR AND POSTERIOR REPAIR N/A 06/14/2014   Procedure: ANTERIOR (CYSTOCELE) AND POSTERIOR REPAIR (RECTOCELE);  Surgeon: Anastasio Auerbach, MD;  Location: [facility];  Service: Gynecology;  Laterality: N/A;  . CHOLECYSTECTOMY    . kidney stone removal     x2  . VAGINAL HYSTERECTOMY N/A 06/14/2014   Procedure: HYSTERECTOMY VAGINAL;  Surgeon: Anastasio Auerbach, MD;  Location: [facility];  Service: Gynecology;  Laterality: N/A;  OB History    Gravida  2   Para  2   Term      Preterm      AB      Living  2     SAB      TAB      Ectopic      Multiple      Live Births               Home Medications    Prior to Admission medications   Medication Sig Start Date End Date Taking? Authorizing Provider  acetaminophen (TYLENOL) 500 MG tablet Take 1,000 mg by mouth every 6 (six) hours as needed for moderate pain or headache.   Yes  [provider]  albuterol (VENTOLIN HFA) 108 (90 Base) MCG/ACT inhaler Inhale 1-2 puffs into the lungs every 6 (six) hours as needed for wheezing or shortness of breath. 11/05/18  Yes [name], DO  Ascorbic Acid (VITAMIN C) 1000 MG tablet Take 1,000 mg by mouth daily.   Yes [provider]  Cholecalciferol (VITAMIN D) 50 MCG (2000 UT) tablet Take 2,000 Units by mouth daily.   Yes [provider]  diphenhydrAMINE-APAP, sleep, (TYLENOL PM EXTRA STRENGTH PO) Take 1 tablet by mouth at bedtime as needed (pain).    Yes [provider]  ELIQUIS 5 MG TABS tablet TAKE 1 TABLET BY MOUTH 2 TIMES DAILY. Patient taking differently: Take 5 mg by mouth 2 (two) times daily.  06/05/18  Yes Nahser, Wonda Cheng, MD  Ferrous Sulfate (IRON PO) Take 1 tablet by mouth daily.   Yes [provider]  fluticasone (FLONASE) 50 MCG/ACT nasal spray Place 1 spray into both nostrils as needed for allergies or rhinitis. 11/05/18  Yes [name], DO  hydrochlorothiazide (HYDRODIURIL) 25 MG tablet Take 1 tablet (25 mg total) by mouth daily as needed (weight gain/swelling). 06/18/18  Yes Nahser, Wonda Cheng, MD  lisinopril (ZESTRIL) 10 MG tablet Take 1 tablet (10 mg total) by mouth daily. 11/05/18  Yes [name], DO  loratadine (CLARITIN) 10 MG tablet Take 10 mg by mouth as needed.    Yes [provider]  montelukast (SINGULAIR) 10 MG tablet Take 1 tablet (10 mg total) by mouth at bedtime. 11/04/18  Yes [name], DO  nadolol (CORGARD) 40 MG tablet TAKE 1 TABLET BY MOUTH TWICE DAILY. Patient taking differently: Take 40 mg by mouth 2 (two) times daily.  11/05/18  Yes [name], DO  pantoprazole (PROTONIX) 40 MG tablet Take 1 tablet (40 mg total) by mouth daily. 11/05/18  Yes [name], DO  potassium chloride (K-DUR) 10 MEQ tablet Take 1 tablet (10 mEq total) by mouth daily as needed (take with HCTZ). 06/18/18  Yes Nahser, Wonda Cheng, MD  Probiotic Product  (PROBIOTIC PO) Take 1 tablet by mouth daily.   Yes [provider]  VITAMIN E PO Take 1 tablet by mouth daily.   Yes [provider]    Family History Family History  Problem Relation Age of Onset  . Cancer Father        prostate  . Breast cancer Paternal Aunt 19  . Diabetes Maternal Grandmother   . Heart disease Maternal Grandmother   . Heart disease Maternal Grandfather   . Melanoma Paternal Aunt   . Neuropathy Mother   . Congestive Heart Failure Mother     Social History Social History   Tobacco Use  . Smoking status: Never Smoker  . Smokeless tobacco: Never  Used  Substance Use Topics  . Alcohol use: Yes    Alcohol/week: 0.0 standard drinks    Comment: Occas  . Drug use: No     Allergies   Erythromycin, Penicillins, and Ibuprofen   Review of Systems Review of Systems  Constitutional: Negative for fever.  Respiratory: Positive for shortness of breath (known asthma). Negative for cough and wheezing.   Cardiovascular: Positive for palpitations. Negative for syncope.  Gastrointestinal: Positive for diarrhea (about 1-2 times per day). Negative for abdominal pain, nausea and vomiting.  Skin: Negative for wound.  Neurological: Positive for dizziness. Negative for syncope.  All other systems reviewed and are negative.    Physical Exam Updated Vital Signs BP (!) 154/110   Pulse (!) 136   Temp 98 F (36.7 C) (Oral)   Resp 18   SpO2 98%   Physical Exam Vitals signs and nursing note reviewed.  Constitutional:      Appearance: She is well-developed. She is not toxic-appearing.  HENT:     Head: Normocephalic and atraumatic.  Eyes:     Extraocular Movements: Extraocular movements intact.     Conjunctiva/sclera: Conjunctivae normal.  Neck:     Musculoskeletal: Neck supple.  Cardiovascular:     Rate and Rhythm: Tachycardia present. Rhythm irregular.     Heart sounds: No murmur.  Pulmonary:     Effort: Pulmonary effort is normal. No  respiratory distress.     Breath sounds: Normal breath sounds.     Comments: Satting well on room air Abdominal:     General: Bowel sounds are normal. There is no distension.     Palpations: Abdomen is soft.     Tenderness: There is no abdominal tenderness.  Skin:    General: Skin is warm and dry.  Neurological:     General: No focal deficit present.     Mental Status: She is alert and oriented to person, place, and time.     Motor: No weakness.  Psychiatric:        Mood and Affect: Mood normal.        Behavior: Behavior normal.      ED Treatments / Results  Labs (all labs ordered are listed, but only abnormal results are displayed) Labs Reviewed  CBC WITH DIFFERENTIAL/PLATELET - Abnormal; Notable for the following components:      Result Value   HCT 35.7 (*)    All other components within normal limits  COMPREHENSIVE METABOLIC PANEL - Abnormal; Notable for the following components:   CO2 21 (*)    Total Protein 6.3 (*)    Total Bilirubin 2.4 (*)    All other components within normal limits  PROTIME-INR  MAGNESIUM    EKG EKG Interpretation  Date/Time:  Thursday November 05 2018 16:48:28 EDT Ventricular Rate:  149 PR Interval:    QRS Duration: 93 QT Interval:  300 QTC Calculation: 473 R Axis:   17 Text Interpretation: Atrial fibrillation Repolarization abnormality, prob rate related Confirmed by Quintella Reichert [phone]) on 11/05/2018 4:55:56 PM   Radiology No results found.  Procedures .Cardioversion  Date/Time: 11/05/2018 9:34 PM Performed by: Julianne Rice, MD Authorized by: Quintella Reichert, MD   Consent:    Consent obtained:  Written   Consent given by:  Patient   Risks discussed:  Pain and induced arrhythmia   Alternatives discussed:  Delayed treatment (Outpatient treatment) Universal protocol:    Patient identity confirmed:  Verbally with patient and arm band Pre-procedure details:    Cardioversion basis:  Elective   Rhythm:  Atrial  fibrillation   Electrode placement:  Anterior-posterior Patient sedated: Yes. Refer to sedation procedure documentation for details of sedation.  Attempt one:    Cardioversion mode:  Synchronous   Shock (Joules):  200   Shock outcome:  Conversion to normal sinus rhythm Post-procedure details:    Patient status:  Alert   Patient tolerance of procedure:  Tolerated well, no immediate complications .Sedation  Date/Time: 11/05/2018 9:35 PM Performed by: Julianne Rice, MD Authorized by: Quintella Reichert, MD   Consent:    Consent obtained:  Written and verbal   Consent given by:  Patient   Risks discussed:  Nausea, vomiting and allergic reaction   Alternatives discussed:  Analgesia without sedation Universal protocol:    Immediately prior to procedure a time out was called: yes   Indications:    Procedure performed:  Cardioversion Pre-sedation assessment:    Time since last food or drink:  Greater than 6 hours   NPO status caution: unable to specify NPO status     ASA classification: class 2 - patient with mild systemic disease     Neck mobility: normal     Mouth opening:  3 or more finger widths   Thyromental distance:  4 finger widths   Mallampati score:  I - soft palate, uvula, fauces, pillars visible   Pre-sedation assessments completed and reviewed: airway patency, cardiovascular function and mental status     Pre-sedation assessment completed:  11/05/2018 9:36 PM Immediate pre-procedure details:    Reassessment: Patient reassessed immediately prior to procedure     Reviewed: vital signs     Verified: bag valve mask available, IV patency confirmed, oxygen available and suction available   Procedure details (see MAR for exact dosages):    Preoxygenation:  Nasal cannula   Sedation:  Propofol   Intended level of sedation: deep   Intra-procedure monitoring:  Blood pressure monitoring, continuous pulse oximetry, cardiac monitor, frequent LOC assessments and frequent vital sign  checks   Intra-procedure events: none     Intra-procedure management:  Airway repositioning   Total Provider sedation time (minutes):  15 Post-procedure details:    Post-sedation assessment completed:  11/05/2018 10:35 PM   Attendance: Constant attendance by certified staff until patient recovered     Recovery: Patient returned to pre-procedure baseline     Post-sedation assessments completed and reviewed: airway patency, cardiovascular function, mental status, nausea/vomiting and respiratory function     Patient tolerance:  Tolerated well, no immediate complications   (including critical care time)  Medications Ordered in ED Medications  nadolol (CORGARD) tablet 40 mg (has no administration in time range)  lactated ringers bolus 1,000 mL (0 mLs Intravenous Stopped 11/05/18 2240)  magnesium sulfate IVPB 2 g 50 mL (0 g Intravenous Stopped 11/05/18 2240)  propofol (DIPRIVAN) 10 mg/mL bolus/IV push 77.6 mg (77.6 mg Intravenous Given 11/05/18 2221)     Initial Impression / Assessment and Plan / ED Course  I have reviewed the triage vital signs and the nursing notes.  Pertinent labs & imaging results that were available during my care of the patient were reviewed by me and considered in my medical decision making (see chart for details).        Anna Marquez is a 68 y.o. female presents today for paroxsymal atrial fibrillation causing dizziness. Valsalva maneuver was tried twice in the room to success of bringing her heart rate down from 140 to 110. Electrolytes ordered. LR bolus ordered. Differential diagnosis  includes, a fib with RVR, electrolyte abnormality, sepsis.  No significant leukocytosis, anemia, electrolyte abnormality. Cardiology curbside consulted (Dr. Hassell Done) and they recommend electrical cardioversion or outpatient follow up if her symptoms are resolved.  Patient was cardioverted successfully per the above procedure note and sedated with propofol.  She tolerated the  procedure well.  She was able to tolerate p.o. intake and ambulate prior to discharge.  Her daughter will be driving home.  CHA2DS2/VAS Stroke Risk Points     5 >= 2 Points: High Risk  1 - 1.99 Points: Medium Risk  0 Points: Low Risk    This is the only CHA2DS2/VAS Stroke Risk Points available for the past  year.: Last Change: N/A     Details    This score determines the patient's risk of having a stroke if the  patient has atrial fibrillation.       Points Metrics  0 Has Congestive Heart Failure:  No    Current as of 28 minutes ago  0 Has Vascular Disease:  No    Current as of 28 minutes ago  1 Has Hypertension:  Yes    Current as of 28 minutes ago  1 Age:  4    Current as of 28 minutes ago  0 Has Diabetes:  No    Current as of 28 minutes ago  2 Had Stroke:  No  Had TIA:  Yes  Had thromboembolism:  No    Current as of 28 minutes ago  1 Female:  Yes    Current as of 28 minutes ago       Final Clinical Impressions(s) / ED Diagnoses   Final diagnoses:  Atrial fibrillation, unspecified type [facility])  Encounter for cardioversion procedure    ED Discharge Orders         Ordered    Amb Referral to AFIB Clinic     11/05/18 2317           Julianne Rice, MD 11/06/18 [id number]    Quintella Reichert, MD 11/07/18 1801

## 2018-11-05 NOTE — ED Notes (Signed)
Propofol administered by Dr. Ralene Bathe.  70mg  total

## 2018-11-05 NOTE — ED Notes (Signed)
Cardioversion is complete.  Pt has HR of 80.

## 2018-11-05 NOTE — ED Notes (Signed)
Time out for cardioversion completed.

## 2018-11-05 NOTE — ED Triage Notes (Signed)
Pt here from home with c/o dizziness , no chest pain no sob , pt has hx of a fib , pt thinks she may have got slightly dehydrated , rate is 100 to 160

## 2018-11-05 NOTE — ED Notes (Signed)
Pt is fully awake after cardioversion.  Pt states she feels great.  HR stable at 77

## 2018-11-06 ENCOUNTER — Other Ambulatory Visit: Payer: Self-pay | Admitting: Family Medicine

## 2018-11-06 DIAGNOSIS — I480 Paroxysmal atrial fibrillation: Secondary | ICD-10-CM

## 2018-11-06 NOTE — Progress Notes (Signed)
Please add free t4 to previous labs.  Thanks!

## 2018-11-06 NOTE — Discharge Instructions (Signed)
Please follow up at the atrial fibrillation clinic within a week.

## 2018-11-06 NOTE — ED Notes (Signed)
Patient verbalizes understanding of discharge instructions. Opportunity for questioning and answers were provided. Armband removed by staff, pt discharged from ED. Pt. ambulatory and discharged home.  

## 2018-11-06 NOTE — ED Notes (Signed)
Patient ambulated in hallway with monitor. No changes in heart rhythm. Food given.

## 2018-11-08 NOTE — Assessment & Plan Note (Signed)
-  Currently in a. Fib but rate is controlled and she is asymptomatic from this at this time. I think her shortness of breath is related to allergies/asthma.  -Discussed red flags including worsening chest pain, shortness of breath, dizziness, fatigue.  Reviewed she should seek emergency care for these symptoms.

## 2018-11-08 NOTE — Assessment & Plan Note (Signed)
shortness of breath seems to be related to asthma and reactive airway disease. Continue current medications with addition of singulair.

## 2018-11-08 NOTE — Assessment & Plan Note (Signed)
-  Check lipid panel with history of HTN/a. fib

## 2018-11-08 NOTE — Assessment & Plan Note (Signed)
-  Continue current medications -Update labs today.

## 2018-11-09 ENCOUNTER — Encounter (HOSPITAL_COMMUNITY): Payer: Self-pay | Admitting: Nurse Practitioner

## 2018-11-09 ENCOUNTER — Other Ambulatory Visit: Payer: Self-pay

## 2018-11-09 ENCOUNTER — Ambulatory Visit (HOSPITAL_COMMUNITY)
Admission: RE | Admit: 2018-11-09 | Discharge: 2018-11-09 | Disposition: A | Discharge status: Home / Self Care | Payer: PPO | Source: Ambulatory Visit | Attending: Nurse Practitioner | Admitting: Nurse Practitioner

## 2018-11-09 ENCOUNTER — Other Ambulatory Visit: Payer: Self-pay | Admitting: Family Medicine

## 2018-11-09 VITALS — BP 144/90 | HR 141 | Ht 65.0 in | Wt 173.8 lb

## 2018-11-09 DIAGNOSIS — Z79899 Other long term (current) drug therapy: Secondary | ICD-10-CM | POA: Insufficient documentation

## 2018-11-09 DIAGNOSIS — Z7901 Long term (current) use of anticoagulants: Secondary | ICD-10-CM | POA: Insufficient documentation

## 2018-11-09 DIAGNOSIS — I10 Essential (primary) hypertension: Secondary | ICD-10-CM | POA: Diagnosis not present

## 2018-11-09 DIAGNOSIS — Z8249 Family history of ischemic heart disease and other diseases of the circulatory system: Secondary | ICD-10-CM | POA: Diagnosis not present

## 2018-11-09 DIAGNOSIS — Z888 Allergy status to other drugs, medicaments and biological substances status: Secondary | ICD-10-CM | POA: Diagnosis not present

## 2018-11-09 DIAGNOSIS — E876 Hypokalemia: Secondary | ICD-10-CM

## 2018-11-09 DIAGNOSIS — I480 Paroxysmal atrial fibrillation: Secondary | ICD-10-CM | POA: Diagnosis not present

## 2018-11-09 DIAGNOSIS — K219 Gastro-esophageal reflux disease without esophagitis: Secondary | ICD-10-CM | POA: Diagnosis not present

## 2018-11-09 DIAGNOSIS — R17 Unspecified jaundice: Secondary | ICD-10-CM

## 2018-11-09 DIAGNOSIS — Z886 Allergy status to analgesic agent status: Secondary | ICD-10-CM | POA: Diagnosis not present

## 2018-11-09 DIAGNOSIS — Z880 Allergy status to penicillin: Secondary | ICD-10-CM | POA: Insufficient documentation

## 2018-11-09 MED ORDER — DILTIAZEM HCL 30 MG PO TABS: ORAL_TABLET | ORAL | 1 refills | Status: DC

## 2018-11-09 NOTE — Patient Instructions (Signed)
Take 1 Tablet Every 4 Hours As Needed For HR <100 as long as top number of BP is <100

## 2018-11-09 NOTE — Progress Notes (Signed)
Please let patient know that I would like her to come in for some additional labs.  -Her thyroid test is abnormal and I want to follow this up with additional lab testing.  -Also her bilirubin levels are elevated, I would like to check some labs to see what is causing this.   Please let her know that I saw that she went to the hospital and I hope she is feeling better.

## 2018-11-09 NOTE — Progress Notes (Signed)
Primary Care Physician: [name], DO Referring Physician: ER f/u Cardiologist: Dr. [name] is a 68 y.o. female with a h/o paroxysmal afib first dx March 2018. She is in the afib office for having afib with RVR this past Friday in the ER,  and receiving successful cardioversion.Unfortunately, she is back in rapid afib at 141 bpm. The pt states that this am, her HR was in the 60's when she checked it. She had an echo and a ETT in 2018. EKG in rhythm in 2018 showed SR with a first degree av block as well at prolonged qtc. ER ekg did not  These changes but had diffuse T wave changes. . In the past she would take an extra corgard to get back in rhythm. No outstanding triggers.   Today, she denies symptoms of palpitations, chest pain, shortness of breath, orthopnea, PND, lower extremity edema, dizziness, presyncope, syncope, or neurologic sequela. The patient is tolerating medications without difficulties and is otherwise without complaint today.   Past Medical History:  Diagnosis Date  . Adenomatous colon polyp 2003  . Cholelithiasis   . Diverticulosis of colon   . Gastric polyps   . GERD (gastroesophageal reflux disease)   . Helicobacter pylori (H. pylori)   . Hiatal hernia   . Hypertension   . Mitral valve prolapse   . Nephrolithiasis   . Osteoarthritis   . Osteopenia 03/2017   T score -1.5 FRAX 8.9% / 0.9%.  Stable from prior DEXA 2016  . TGA (transient global amnesia)    Past Surgical History:  Procedure Laterality Date  . ANTERIOR AND POSTERIOR REPAIR N/A 06/14/2014   Procedure: ANTERIOR (CYSTOCELE) AND POSTERIOR REPAIR (RECTOCELE);  Surgeon: Anastasio Auerbach, MD;  Location: [facility];  Service: Gynecology;  Laterality: N/A;  . CHOLECYSTECTOMY    . kidney stone removal     x2  . VAGINAL HYSTERECTOMY N/A 06/14/2014   Procedure: HYSTERECTOMY VAGINAL;  Surgeon: Anastasio Auerbach, MD;  Location: [facility];  Service: Gynecology;  Laterality: N/A;    Current  Outpatient Medications  Medication Sig Dispense Refill  . acetaminophen (TYLENOL) 500 MG tablet Take 1,000 mg by mouth every 6 (six) hours as needed for moderate pain or headache.    . albuterol (VENTOLIN HFA) 108 (90 Base) MCG/ACT inhaler Inhale 1-2 puffs into the lungs every 6 (six) hours as needed for wheezing or shortness of breath. 18 g 3  . Ascorbic Acid (VITAMIN C) 1000 MG tablet Take 1,000 mg by mouth daily.    . Cholecalciferol (VITAMIN D) 50 MCG (2000 UT) tablet Take 2,000 Units by mouth daily.    . diphenhydrAMINE-APAP, sleep, (TYLENOL PM EXTRA STRENGTH PO) Take 1 tablet by mouth at bedtime as needed (pain).     [name] ELIQUIS 5 MG TABS tablet TAKE 1 TABLET BY MOUTH 2 TIMES DAILY. (Patient taking differently: Take 5 mg by mouth 2 (two) times daily. ) 60 tablet 6  . Ferrous Sulfate (IRON PO) Take 1 tablet by mouth daily.    . fluticasone (FLONASE) 50 MCG/ACT nasal spray Place 1 spray into both nostrils as needed for allergies or rhinitis. 16 g 6  . hydrochlorothiazide (HYDRODIURIL) 25 MG tablet Take 1 tablet (25 mg total) by mouth daily as needed (weight gain/swelling). 30 tablet 6  . lisinopril (ZESTRIL) 10 MG tablet Take 1 tablet (10 mg total) by mouth daily. 100 tablet 3  . loratadine (CLARITIN) 10 MG tablet Take 10 mg by mouth as  needed.     . montelukast (SINGULAIR) 10 MG tablet Take 1 tablet (10 mg total) by mouth at bedtime. 30 tablet 3  . nadolol (CORGARD) 40 MG tablet TAKE 1 TABLET BY MOUTH TWICE DAILY. (Patient taking differently: Take 40 mg by mouth 2 (two) times daily. ) 180 tablet 4  . pantoprazole (PROTONIX) 40 MG tablet Take 1 tablet (40 mg total) by mouth daily. 90 tablet 4  . potassium chloride (K-DUR) 10 MEQ tablet Take 1 tablet (10 mEq total) by mouth daily as needed (take with HCTZ). (Patient taking differently: Take 10 mEq by mouth as needed (take with HCTZ). Only as needed) 30 tablet 6  . Probiotic Product (PROBIOTIC PO) Take 1 tablet by mouth daily.    [name] VITAMIN E PO  Take 1 tablet by mouth daily.    [name] diltiazem (CARDIZEM) 30 MG tablet Take 1 Tablet Every 4 Hours As Needed For HR <100 45 tablet 1   No current facility-administered medications for this encounter.     Allergies  Allergen Reactions  . Erythromycin Nausea And Vomiting and Other (See Comments)    REACTION: Stomach upset  . Penicillins Hives and Shortness Of Breath    Has patient had a PCN reaction causing immediate rash, facial/tongue/throat swelling, SOB or lightheadedness with hypotension: yes Has patient had a PCN reaction causing severe rash involving mucus membranes or skin necrosis: no Has patient had a PCN reaction that required hospitalization: unknown Has patient had a PCN reaction occurring within the last 10 years: no If all of the above answers are "NO", then may proceed with Cephalosporin use.   . Ibuprofen Other (See Comments)    Pt states allergy to NSAIDS is secondary to ulcers NOT anaphylaxis. Tolerates daily 325 ASA at home    Social History   Socioeconomic History  . Marital status: Divorced    Spouse name: Not on file  . Number of children: Not on file  . Years of education: Not on file  . Highest education level: Not on file  Occupational History  . Not on file  Social Needs  . Financial resource strain: Not on file  . Food insecurity    Worry: Not on file    Inability: Not on file  . Transportation needs    Medical: Not on file    Non-medical: Not on file  Tobacco Use  . Smoking status: Never Smoker  . Smokeless tobacco: Never Used  Substance and Sexual Activity  . Alcohol use: Yes    Alcohol/week: 0.0 standard drinks    Comment: Occas  . Drug use: No  . Sexual activity: Yes    Birth control/protection: Post-menopausal    Comment: 1st intercourse 68 yo-Fewer than 5 partners  Lifestyle  . Physical activity    Days per week: Not on file    Minutes per session: Not on file  . Stress: Not on file  Relationships  . Social Herbalist on  phone: Not on file    Gets together: Not on file    Attends religious service: Not on file    Active member of club or organization: Not on file    Attends meetings of clubs or organizations: Not on file    Relationship status: Not on file  . Intimate partner violence    Fear of current or ex partner: Not on file    Emotionally abused: Not on file    Physically abused: Not on file  Forced sexual activity: Not on file  Other Topics Concern  . Not on file  Social History Narrative  . Not on file    Family History  Problem Relation Age of Onset  . Cancer Father        prostate  . Breast cancer Paternal Aunt 76  . Diabetes Maternal Grandmother   . Heart disease Maternal Grandmother   . Heart disease Maternal Grandfather   . Melanoma Paternal Aunt   . Neuropathy Mother   . Congestive Heart Failure Mother     ROS- All systems are reviewed and negative except as per the HPI above  Physical Exam: Vitals:   11/09/18 1029  BP: (!) 144/90  Pulse: (!) 141  Weight: 78.8 kg  Height: 5\' 5"  (1.651 m)   Wt Readings from Last 3 Encounters:  11/09/18 78.8 kg  11/04/18 77.6 kg  09/25/18 78.9 kg    Labs: Lab Results  Component Value Date   NA 139 11/05/2018   K 3.6 11/05/2018   CL 108 11/05/2018   CO2 21 (L) 11/05/2018   GLUCOSE 89 11/05/2018   BUN 17 11/05/2018   CREATININE 0.93 11/05/2018   CALCIUM 9.0 11/05/2018   MG 1.7 11/05/2018   Lab Results  Component Value Date   INR 1.1 11/05/2018   Lab Results  Component Value Date   CHOL 184 11/04/2018   HDL 50.60 11/04/2018   LDLCALC 103 (H) 10/28/2017   TRIG 259.0 (H) 11/04/2018     GEN- The patient is well appearing, alert and oriented x 3 today.   Head- normocephalic, atraumatic Eyes-  Sclera clear, conjunctiva pink Ears- hearing intact Oropharynx- clear Neck- supple, no JVP Lymph- no cervical lymphadenopathy Lungs- Clear to ausculation bilaterally, normal work of breathing Heart- irregular  rate and  rhythm, no murmurs, rubs or gallops, PMI not laterally displaced GI- soft, NT, ND, + BS Extremities- no clubbing, cyanosis, or edema MS- no significant deformity or atrophy Skin- no rash or lesion Psych- euthymic mood, full affect Neuro- strength and sensation are intact  EKG-afib at 141 bpm, qrs 82 bpm, qtc 505 ms EchoStudy Conclusions-2018  - Left ventricle: The cavity size was normal. There was mild   concentric hypertrophy. Systolic function was normal. The   estimated ejection fraction was in the range of 55% to 60%. Wall   motion was normal; there were no regional wall motion   abnormalities. Doppler parameters are consistent with abnormal   left ventricular relaxation (grade 1 diastolic dysfunction). - Mitral valve: There was mild regurgitation.-   Assessment and Plan: 1. Paroxysmal afib  Successful cardioversion but with ERAF Discussed options We discussed multaq and flecainide  She will continue corgard 40 mg bid  2. CHA2DS2VASc score of at least 3 Continue eliquis 5 mg bid   I will try to convert her with 30 mg Cardizem, one every 4-6  hours if HR and systolic BP are over 123XX123. She states at home her BP's are often less than 123XX123 systolic so I do not want to start daily Cardizem. She will return on Wednesday for repeat EKG. If in SR, will need updated echo and stress test if considering  antiarrythmic's as above.   Geroge Baseman Abhi Moccia, [facility] [address] [place], [place] [address] [phone]

## 2018-11-11 ENCOUNTER — Other Ambulatory Visit (INDEPENDENT_AMBULATORY_CARE_PROVIDER_SITE_OTHER): Payer: PPO

## 2018-11-11 ENCOUNTER — Other Ambulatory Visit: Payer: Self-pay

## 2018-11-11 ENCOUNTER — Ambulatory Visit (HOSPITAL_COMMUNITY)
Admission: RE | Admit: 2018-11-11 | Discharge: 2018-11-11 | Disposition: A | Discharge status: Home / Self Care | Payer: PPO | Source: Ambulatory Visit | Attending: Nurse Practitioner | Admitting: Nurse Practitioner

## 2018-11-11 VITALS — BP 142/80 | HR 76

## 2018-11-11 DIAGNOSIS — I480 Paroxysmal atrial fibrillation: Secondary | ICD-10-CM

## 2018-11-11 DIAGNOSIS — E876 Hypokalemia: Secondary | ICD-10-CM

## 2018-11-11 DIAGNOSIS — R17 Unspecified jaundice: Secondary | ICD-10-CM

## 2018-11-11 LAB — HEPATIC FUNCTION PANEL
ALT: 13 U/L (ref 0–35)
AST: 11 U/L (ref 0–37)
Albumin: 4.2 g/dL (ref 3.5–5.2)
Alkaline Phosphatase: 43 U/L (ref 39–117)
Bilirubin, Direct: 0.2 mg/dL (ref 0.0–0.3)
Total Bilirubin: 1.6 mg/dL — ABNORMAL HIGH (ref 0.2–1.2)
Total Protein: 6.7 g/dL (ref 6.0–8.3)

## 2018-11-11 LAB — BASIC METABOLIC PANEL
BUN: 16 mg/dL (ref 6–23)
CO2: 27 mEq/L (ref 19–32)
Calcium: 9.2 mg/dL (ref 8.4–10.5)
Chloride: 104 mEq/L (ref 96–112)
Creatinine, Ser: 0.97 mg/dL (ref 0.40–1.20)
GFR: 56.81 mL/min — ABNORMAL LOW (ref >60.00)
Glucose, Bld: 87 mg/dL (ref 70–99)
Potassium: 3.3 mEq/L — ABNORMAL LOW (ref 3.5–5.1)
Sodium: 140 mEq/L (ref 135–145)

## 2018-11-11 LAB — T4, FREE: Free T4: 1.17 ng/dL (ref 0.60–1.60)

## 2018-11-11 NOTE — Progress Notes (Signed)
Pt in for EKG as she was in afib on Monday in the office. She later went back into SR on leaving the clinic in just a few hours. She now has 30 mg Cardizem to use as needed. Will update the echo as she now has SR. But she wants to  hold off on antiarrythmic's for now as afib has been very rare  until recent. I will see back in 2 months sooner if afib burden escalates.

## 2018-11-12 ENCOUNTER — Other Ambulatory Visit: Payer: PPO

## 2018-11-16 ENCOUNTER — Other Ambulatory Visit: Payer: Self-pay

## 2018-11-16 ENCOUNTER — Ambulatory Visit (HOSPITAL_COMMUNITY)
Admission: RE | Admit: 2018-11-16 | Discharge: 2018-11-16 | Disposition: A | Discharge status: Home / Self Care | Payer: PPO | Source: Ambulatory Visit | Attending: Nurse Practitioner | Admitting: Nurse Practitioner

## 2018-11-16 DIAGNOSIS — R002 Palpitations: Secondary | ICD-10-CM | POA: Insufficient documentation

## 2018-11-16 DIAGNOSIS — K219 Gastro-esophageal reflux disease without esophagitis: Secondary | ICD-10-CM | POA: Diagnosis not present

## 2018-11-16 DIAGNOSIS — Z8673 Personal history of transient ischemic attack (TIA), and cerebral infarction without residual deficits: Secondary | ICD-10-CM | POA: Insufficient documentation

## 2018-11-16 DIAGNOSIS — I10 Essential (primary) hypertension: Secondary | ICD-10-CM | POA: Diagnosis not present

## 2018-11-16 DIAGNOSIS — I340 Nonrheumatic mitral (valve) insufficiency: Secondary | ICD-10-CM | POA: Insufficient documentation

## 2018-11-16 DIAGNOSIS — I480 Paroxysmal atrial fibrillation: Secondary | ICD-10-CM | POA: Insufficient documentation

## 2018-11-16 NOTE — Progress Notes (Signed)
Potassium low on recent labs, is she still taking potassium supplement? Bilirubin is improved, likely related to her a. Fib.

## 2018-11-16 NOTE — Progress Notes (Signed)
  Echocardiogram 2D Echocardiogram has been performed.  Anna Marquez 11/16/2018, 11:50 AM

## 2018-11-25 ENCOUNTER — Other Ambulatory Visit: Payer: Self-pay

## 2018-11-25 ENCOUNTER — Ambulatory Visit (HOSPITAL_COMMUNITY)
Admission: RE | Admit: 2018-11-25 | Discharge: 2018-11-25 | Disposition: A | Discharge status: Home / Self Care | Payer: PPO | Source: Ambulatory Visit | Attending: Nurse Practitioner | Admitting: Nurse Practitioner

## 2018-11-25 ENCOUNTER — Encounter (HOSPITAL_COMMUNITY): Payer: Self-pay | Admitting: Nurse Practitioner

## 2018-11-25 VITALS — BP 126/90 | HR 116 | Ht 65.0 in | Wt 172.8 lb

## 2018-11-25 DIAGNOSIS — D6869 Other thrombophilia: Secondary | ICD-10-CM

## 2018-11-25 DIAGNOSIS — Z7901 Long term (current) use of anticoagulants: Secondary | ICD-10-CM | POA: Diagnosis not present

## 2018-11-25 DIAGNOSIS — Z79899 Other long term (current) drug therapy: Secondary | ICD-10-CM | POA: Diagnosis not present

## 2018-11-25 DIAGNOSIS — I480 Paroxysmal atrial fibrillation: Secondary | ICD-10-CM | POA: Insufficient documentation

## 2018-11-25 DIAGNOSIS — M1990 Unspecified osteoarthritis, unspecified site: Secondary | ICD-10-CM | POA: Diagnosis not present

## 2018-11-25 DIAGNOSIS — E876 Hypokalemia: Secondary | ICD-10-CM | POA: Insufficient documentation

## 2018-11-25 DIAGNOSIS — K219 Gastro-esophageal reflux disease without esophagitis: Secondary | ICD-10-CM | POA: Diagnosis not present

## 2018-11-25 DIAGNOSIS — M8580 Other specified disorders of bone density and structure, unspecified site: Secondary | ICD-10-CM | POA: Diagnosis not present

## 2018-11-25 DIAGNOSIS — I10 Essential (primary) hypertension: Secondary | ICD-10-CM | POA: Insufficient documentation

## 2018-11-25 MED ORDER — NADOLOL 40 MG PO TABS: 60.0000 mg | ORAL_TABLET | Freq: Two times a day (BID) | ORAL | 3 refills | Status: DC

## 2018-11-25 NOTE — Patient Instructions (Signed)
Increase Nadolol 60mg  twice a day  Potassium 47mEq for three days then reduce to as needed

## 2018-11-25 NOTE — Progress Notes (Addendum)
Primary Care Physician: [name], DO Referring Physician: ER f/u Cardiologist: Dr. [name] is a 68 y.o. female with a h/o paroxysmal afib first dx March 2018. She is in the afib office for having afib with RVR this past Friday in the ER,  and receiving successful cardioversion.Unfortunately, she is back in rapid afib at 141 bpm. The pt states that this am, her HR was in the 60's when she checked it. She had an echo and a ETT in 2018. EKG in rhythm in 2018 showed SR with a first degree av block as well at prolonged qtc. ER ekg did not show these changes but had diffuse T wave changes. . In the past she would take an extra corgard to get back in rhythm. No outstanding triggers.   Last time I saw her she was in [place]. I sent for echo. She was in afib with an EF of 45-50%. She  is now back in afib clinic. Earlier today her HR was normal and now in afib. We discuss  antiarrythmic's again. However, now  that she has an EF of 45 to 50 %, by guidelines Multaq and flecainide are contraindicated. Pt does not feel she is symptomatic enough to warrant hospitalization for Tikosyn or sotalol. By guidelines, she is not an ablation candidate at this time. Concerned with use of amiodarone at relatively young age and potential side effects.   Today, she denies symptoms of palpitations, chest pain, shortness of breath, orthopnea, PND, lower extremity edema, dizziness, presyncope, syncope, or neurologic sequela. The patient is tolerating medications without difficulties and is otherwise without complaint today.   Past Medical History:  Diagnosis Date  . Adenomatous colon polyp 2003  . Cholelithiasis   . Diverticulosis of colon   . Gastric polyps   . GERD (gastroesophageal reflux disease)   . Helicobacter pylori (H. pylori)   . Hiatal hernia   . Hypertension   . Mitral valve prolapse   . Nephrolithiasis   . Osteoarthritis   . Osteopenia 03/2017   T score -1.5 FRAX 8.9% / 0.9%.  Stable  from prior DEXA 2016  . TGA (transient global amnesia)    Past Surgical History:  Procedure Laterality Date  . ANTERIOR AND POSTERIOR REPAIR N/A 06/14/2014   Procedure: ANTERIOR (CYSTOCELE) AND POSTERIOR REPAIR (RECTOCELE);  Surgeon: Anastasio Auerbach, MD;  Location: [facility];  Service: Gynecology;  Laterality: N/A;  . CHOLECYSTECTOMY    . kidney stone removal     x2  . VAGINAL HYSTERECTOMY N/A 06/14/2014   Procedure: HYSTERECTOMY VAGINAL;  Surgeon: Anastasio Auerbach, MD;  Location: [facility];  Service: Gynecology;  Laterality: N/A;    Current Outpatient Medications  Medication Sig Dispense Refill  . acetaminophen (TYLENOL) 500 MG tablet Take 1,000 mg by mouth every 6 (six) hours as needed for moderate pain or headache.    . albuterol (VENTOLIN HFA) 108 (90 Base) MCG/ACT inhaler Inhale 1-2 puffs into the lungs every 6 (six) hours as needed for wheezing or shortness of breath. 18 g 3  . Ascorbic Acid (VITAMIN C) 1000 MG tablet Take 1,000 mg by mouth daily.    . Cholecalciferol (VITAMIN D) 50 MCG (2000 UT) tablet Take 2,000 Units by mouth daily.    [name] diltiazem (CARDIZEM) 30 MG tablet Take 1 Tablet Every 4 Hours As Needed For HR <100 45 tablet 1  . diphenhydrAMINE-APAP, sleep, (TYLENOL PM EXTRA STRENGTH PO) Take 1 tablet by mouth at bedtime as needed (  pain).     [name] ELIQUIS 5 MG TABS tablet TAKE 1 TABLET BY MOUTH 2 TIMES DAILY. (Patient taking differently: Take 5 mg by mouth 2 (two) times daily. ) 60 tablet 6  . Ferrous Sulfate (IRON PO) Take 1 tablet by mouth daily.    . fluticasone (FLONASE) 50 MCG/ACT nasal spray Place 1 spray into both nostrils as needed for allergies or rhinitis. 16 g 6  . hydrochlorothiazide (HYDRODIURIL) 25 MG tablet Take 1 tablet (25 mg total) by mouth daily as needed (weight gain/swelling). 30 tablet 6  . lisinopril (ZESTRIL) 10 MG tablet Take 1 tablet (10 mg total) by mouth daily. 100 tablet 3  . loratadine (CLARITIN) 10 MG tablet Take 10 mg by mouth as needed.     .  montelukast (SINGULAIR) 10 MG tablet Take 1 tablet (10 mg total) by mouth at bedtime. 30 tablet 3  . nadolol (CORGARD) 40 MG tablet Take 1.5 tablets (60 mg total) by mouth 2 (two) times daily. 270 tablet 3  . pantoprazole (PROTONIX) 40 MG tablet Take 1 tablet (40 mg total) by mouth daily. 90 tablet 4  . potassium chloride (K-DUR) 10 MEQ tablet Take 1 tablet (10 mEq total) by mouth daily as needed (take with HCTZ). (Patient taking differently: Take 10 mEq by mouth as needed (take with HCTZ). Only as needed) 30 tablet 6  . Probiotic Product (PROBIOTIC PO) Take 1 tablet by mouth daily.    [name] VITAMIN E PO Take 1 tablet by mouth daily.     No current facility-administered medications for this encounter.     Allergies  Allergen Reactions  . Erythromycin Nausea And Vomiting and Other (See Comments)    REACTION: Stomach upset  . Penicillins Hives and Shortness Of Breath    Has patient had a PCN reaction causing immediate rash, facial/tongue/throat swelling, SOB or lightheadedness with hypotension: yes Has patient had a PCN reaction causing severe rash involving mucus membranes or skin necrosis: no Has patient had a PCN reaction that required hospitalization: unknown Has patient had a PCN reaction occurring within the last 10 years: no If all of the above answers are "NO", then may proceed with Cephalosporin use.   . Ibuprofen Other (See Comments)    Pt states allergy to NSAIDS is secondary to ulcers NOT anaphylaxis. Tolerates daily 325 ASA at home    Social History   Socioeconomic History  . Marital status: Divorced    Spouse name: Not on file  . Number of children: Not on file  . Years of education: Not on file  . Highest education level: Not on file  Occupational History  . Not on file  Social Needs  . Financial resource strain: Not on file  . Food insecurity    Worry: Not on file    Inability: Not on file  . Transportation needs    Medical: Not on file    Non-medical: Not on file   Tobacco Use  . Smoking status: Never Smoker  . Smokeless tobacco: Never Used  Substance and Sexual Activity  . Alcohol use: Yes    Alcohol/week: 0.0 standard drinks    Comment: Occas  . Drug use: No  . Sexual activity: Yes    Birth control/protection: Post-menopausal    Comment: 1st intercourse 68 yo-Fewer than 5 partners  Lifestyle  . Physical activity    Days per week: Not on file    Minutes per session: Not on file  . Stress: Not on file  Relationships  .  Social Herbalist on phone: Not on file    Gets together: Not on file    Attends religious service: Not on file    Active member of club or organization: Not on file    Attends meetings of clubs or organizations: Not on file    Relationship status: Not on file  . Intimate partner violence    Fear of current or ex partner: Not on file    Emotionally abused: Not on file    Physically abused: Not on file    Forced sexual activity: Not on file  Other Topics Concern  . Not on file  Social History Narrative  . Not on file    Family History  Problem Relation Age of Onset  . Cancer Father        prostate  . Breast cancer Paternal Aunt 39  . Diabetes Maternal Grandmother   . Heart disease Maternal Grandmother   . Heart disease Maternal Grandfather   . Melanoma Paternal Aunt   . Neuropathy Mother   . Congestive Heart Failure Mother     ROS- All systems are reviewed and negative except as per the HPI above  Physical Exam: Vitals:   11/25/18 1102  BP: 126/90  Pulse: (!) 116  Weight: 78.4 kg  Height: 5\' 5"  (1.651 m)   Wt Readings from Last 3 Encounters:  11/25/18 78.4 kg  11/09/18 78.8 kg  11/04/18 77.6 kg    Labs: Lab Results  Component Value Date   NA 140 11/11/2018   K 3.3 (L) 11/11/2018   CL 104 11/11/2018   CO2 27 11/11/2018   GLUCOSE 87 11/11/2018   BUN 16 11/11/2018   CREATININE 0.97 11/11/2018   CALCIUM 9.2 11/11/2018   MG 1.7 11/05/2018   Lab Results  Component Value Date   INR  1.1 11/05/2018   Lab Results  Component Value Date   CHOL 184 11/04/2018   HDL 50.60 11/04/2018   LDLCALC 103 (H) 10/28/2017   TRIG 259.0 (H) 11/04/2018     GEN- The patient is well appearing, alert and oriented x 3 today.   Head- normocephalic, atraumatic Eyes-  Sclera clear, conjunctiva pink Ears- hearing intact Oropharynx- clear Neck- supple, no JVP Lymph- no cervical lymphadenopathy Lungs- Clear to ausculation bilaterally, normal work of breathing Heart- irregular  rate and rhythm, no murmurs, rubs or gallops, PMI not laterally displaced GI- soft, NT, ND, + BS Extremities- no clubbing, cyanosis, or edema MS- no significant deformity or atrophy Skin- no rash or lesion Psych- euthymic mood, full affect Neuro- strength and sensation are intact  EKG-afib at 141 bpm, qrs 82 bpm, qtc 505 ms EchoStudy Conclusions-2018  - Left ventricle: The cavity size was normal. There was mild   concentric hypertrophy. Systolic function was normal. The   estimated ejection fraction was in the range of 55% to 60%. Wall   motion was normal; there were no regional wall motion   abnormalities. Doppler parameters are consistent with abnormal   left ventricular relaxation (grade 1 diastolic dysfunction). - Mitral valve: There was mild regurgitation.-   Assessment and Plan: 1. Paroxysmal asymptomatic  afib  Successful cardioversion but with ERAF At time of echo she was in afib Echo showed EF of 45-50% We previously  discussed multaq and flecainide, but now contraindicated with EF of 45-50%.  She is not ready to commit to Tikosyn or sotalol with necessary hospitalization She is wanting conservative approach I will increase nadolol to 60 mg  bid  Zio patch x 1 week   Reminded she can still use 30 mg cardizem as needed for racing   2. CHA2DS2VASc score of at least 3 Continue eliquis 5 mg bid   3.Hypokalemia  Bmet form 11/04 showed a k+ at 3.3  Take 10 meq K+ qd x 3 days and then go back  to prn use with HCTZ ( which she takes very infrequent)   I will see back in 3 weeks   Butch Penny C. Dayne Chait, [facility] [address] [place], [place] [address] [phone]

## 2018-12-09 DIAGNOSIS — I480 Paroxysmal atrial fibrillation: Secondary | ICD-10-CM | POA: Diagnosis not present

## 2018-12-16 ENCOUNTER — Other Ambulatory Visit: Payer: Self-pay

## 2018-12-16 ENCOUNTER — Encounter (HOSPITAL_COMMUNITY): Payer: Self-pay | Admitting: Nurse Practitioner

## 2018-12-16 ENCOUNTER — Ambulatory Visit (HOSPITAL_COMMUNITY)
Admission: RE | Admit: 2018-12-16 | Discharge: 2018-12-16 | Disposition: A | Discharge status: Home / Self Care | Payer: PPO | Source: Ambulatory Visit | Attending: Nurse Practitioner | Admitting: Nurse Practitioner

## 2018-12-16 ENCOUNTER — Telehealth: Payer: Self-pay | Admitting: Pharmacist

## 2018-12-16 VITALS — BP 120/82 | HR 121 | Ht 65.0 in | Wt 174.2 lb

## 2018-12-16 DIAGNOSIS — Z833 Family history of diabetes mellitus: Secondary | ICD-10-CM | POA: Insufficient documentation

## 2018-12-16 DIAGNOSIS — Z90710 Acquired absence of both cervix and uterus: Secondary | ICD-10-CM | POA: Insufficient documentation

## 2018-12-16 DIAGNOSIS — Z886 Allergy status to analgesic agent status: Secondary | ICD-10-CM | POA: Diagnosis not present

## 2018-12-16 DIAGNOSIS — I4819 Other persistent atrial fibrillation: Secondary | ICD-10-CM | POA: Diagnosis not present

## 2018-12-16 DIAGNOSIS — M8580 Other specified disorders of bone density and structure, unspecified site: Secondary | ICD-10-CM | POA: Insufficient documentation

## 2018-12-16 DIAGNOSIS — I480 Paroxysmal atrial fibrillation: Secondary | ICD-10-CM | POA: Insufficient documentation

## 2018-12-16 DIAGNOSIS — Z9049 Acquired absence of other specified parts of digestive tract: Secondary | ICD-10-CM | POA: Insufficient documentation

## 2018-12-16 DIAGNOSIS — M1990 Unspecified osteoarthritis, unspecified site: Secondary | ICD-10-CM | POA: Insufficient documentation

## 2018-12-16 DIAGNOSIS — R9431 Abnormal electrocardiogram [ECG] [EKG]: Secondary | ICD-10-CM | POA: Insufficient documentation

## 2018-12-16 DIAGNOSIS — I10 Essential (primary) hypertension: Secondary | ICD-10-CM | POA: Insufficient documentation

## 2018-12-16 DIAGNOSIS — Z881 Allergy status to other antibiotic agents status: Secondary | ICD-10-CM | POA: Insufficient documentation

## 2018-12-16 DIAGNOSIS — D6869 Other thrombophilia: Secondary | ICD-10-CM | POA: Diagnosis not present

## 2018-12-16 DIAGNOSIS — Z8042 Family history of malignant neoplasm of prostate: Secondary | ICD-10-CM | POA: Insufficient documentation

## 2018-12-16 DIAGNOSIS — Z7901 Long term (current) use of anticoagulants: Secondary | ICD-10-CM | POA: Diagnosis not present

## 2018-12-16 DIAGNOSIS — Z808 Family history of malignant neoplasm of other organs or systems: Secondary | ICD-10-CM | POA: Diagnosis not present

## 2018-12-16 DIAGNOSIS — Z803 Family history of malignant neoplasm of breast: Secondary | ICD-10-CM | POA: Insufficient documentation

## 2018-12-16 DIAGNOSIS — Z8249 Family history of ischemic heart disease and other diseases of the circulatory system: Secondary | ICD-10-CM | POA: Diagnosis not present

## 2018-12-16 DIAGNOSIS — K219 Gastro-esophageal reflux disease without esophagitis: Secondary | ICD-10-CM | POA: Insufficient documentation

## 2018-12-16 DIAGNOSIS — Z79899 Other long term (current) drug therapy: Secondary | ICD-10-CM | POA: Diagnosis not present

## 2018-12-16 DIAGNOSIS — I440 Atrioventricular block, first degree: Secondary | ICD-10-CM | POA: Diagnosis not present

## 2018-12-16 DIAGNOSIS — Z880 Allergy status to penicillin: Secondary | ICD-10-CM | POA: Insufficient documentation

## 2018-12-16 DIAGNOSIS — I341 Nonrheumatic mitral (valve) prolapse: Secondary | ICD-10-CM | POA: Diagnosis not present

## 2018-12-16 DIAGNOSIS — I340 Nonrheumatic mitral (valve) insufficiency: Secondary | ICD-10-CM | POA: Insufficient documentation

## 2018-12-16 LAB — BASIC METABOLIC PANEL
Anion gap: 10 (ref 5–15)
BUN: 12 mg/dL (ref 8–23)
CO2: 23 mmol/L (ref 22–32)
Calcium: 9.2 mg/dL (ref 8.9–10.3)
Chloride: 108 mmol/L (ref 98–111)
Creatinine, Ser: 1.06 mg/dL — ABNORMAL HIGH (ref 0.44–1.00)
GFR calc Af Amer: >60 mL/min (ref >60)
GFR calc non Af Amer: 54 mL/min — ABNORMAL LOW (ref >60)
Glucose, Bld: 94 mg/dL (ref 70–99)
Potassium: 4 mmol/L (ref 3.5–5.1)
Sodium: 141 mmol/L (ref 135–145)

## 2018-12-16 LAB — MAGNESIUM: Magnesium: 2 mg/dL (ref 1.7–2.4)

## 2018-12-16 NOTE — Progress Notes (Signed)
Primary Care Physician: [name], DO Referring Physician: ER f/u Cardiologist: Dr. [name] is a 68 y.o. female with a h/o paroxysmal afib first dx March 2018. She was originally  seen  in the afib office for having afib with RVR  in the ER,  and receiving successful cardioversion.Unfortunately, she was back in rapid afib at 141 bpm. The pt states that this am, her HR was in the 60's when she checked it. She had an echo and a ETT in 2018. EKG in rhythm in 2018 showed SR with a first degree av block.  In the past she would take an extra corgard to get back in rhythm. No outstanding triggers.   Return to afib clinic 12/9. Echo showed reduced EF at 45-50% and Zio patch showed continuous afib with reasonable HR control  at 96 bpm, max at 218 ms, minimum at 54 bpm.  Discussed antiarrythmic's with pt. With the reduced EF and evidence of first degree AV block on a prior EKG in rhythm, it would be by guidelines to avoid Multaq  and flecainide. Discussed with pt use of  tikosyn and she wants to pursue.   Today, she denies symptoms of palpitations, chest pain, shortness of breath, orthopnea, PND, lower extremity edema, dizziness, presyncope, syncope, or neurologic sequela. The patient is tolerating medications without difficulties and is otherwise without complaint today.   Past Medical History:  Diagnosis Date  . Adenomatous colon polyp 2003  . Cholelithiasis   . Diverticulosis of colon   . Gastric polyps   . GERD (gastroesophageal reflux disease)   . Helicobacter pylori (H. pylori)   . Hiatal hernia   . Hypertension   . Mitral valve prolapse   . Nephrolithiasis   . Osteoarthritis   . Osteopenia 03/2017   T score -1.5 FRAX 8.9% / 0.9%.  Stable from prior DEXA 2016  . TGA (transient global amnesia)    Past Surgical History:  Procedure Laterality Date  . ANTERIOR AND POSTERIOR REPAIR N/A 06/14/2014   Procedure: ANTERIOR (CYSTOCELE) AND POSTERIOR REPAIR (RECTOCELE);   Surgeon: Anastasio Auerbach, MD;  Location: [facility];  Service: Gynecology;  Laterality: N/A;  . CHOLECYSTECTOMY    . kidney stone removal     x2  . VAGINAL HYSTERECTOMY N/A 06/14/2014   Procedure: HYSTERECTOMY VAGINAL;  Surgeon: Anastasio Auerbach, MD;  Location: [facility];  Service: Gynecology;  Laterality: N/A;    Current Outpatient Medications  Medication Sig Dispense Refill  . acetaminophen (TYLENOL) 500 MG tablet Take 1,000 mg by mouth every 6 (six) hours as needed for moderate pain or headache.    . albuterol (VENTOLIN HFA) 108 (90 Base) MCG/ACT inhaler Inhale 1-2 puffs into the lungs every 6 (six) hours as needed for wheezing or shortness of breath. 18 g 3  . Ascorbic Acid (VITAMIN C) 1000 MG tablet Take 1,000 mg by mouth daily.    . Cholecalciferol (VITAMIN D) 50 MCG (2000 UT) tablet Take 2,000 Units by mouth daily.    [name] diltiazem (CARDIZEM) 30 MG tablet Take 1 Tablet Every 4 Hours As Needed For HR <100 45 tablet 1  . diphenhydrAMINE-APAP, sleep, (TYLENOL PM EXTRA STRENGTH PO) Take 1 tablet by mouth at bedtime as needed (pain).     [name] ELIQUIS 5 MG TABS tablet TAKE 1 TABLET BY MOUTH 2 TIMES DAILY. (Patient taking differently: Take 5 mg by mouth 2 (two) times daily. ) 60 tablet 6  . Ferrous Sulfate (IRON PO) Take  1 tablet by mouth every other day.     . fluticasone (FLONASE) 50 MCG/ACT nasal spray Place 1 spray into both nostrils as needed for allergies or rhinitis. 16 g 6  . hydrochlorothiazide (HYDRODIURIL) 25 MG tablet Take 1 tablet (25 mg total) by mouth daily as needed (weight gain/swelling). 30 tablet 6  . lisinopril (ZESTRIL) 10 MG tablet Take 1 tablet (10 mg total) by mouth daily. 100 tablet 3  . loratadine (CLARITIN) 10 MG tablet Take 10 mg by mouth as needed.     . montelukast (SINGULAIR) 10 MG tablet Take 1 tablet (10 mg total) by mouth at bedtime. 30 tablet 3  . nadolol (CORGARD) 40 MG tablet Take 1.5 tablets (60 mg total) by mouth 2 (two) times daily. 270 tablet 3  . pantoprazole  (PROTONIX) 40 MG tablet Take 1 tablet (40 mg total) by mouth daily. 90 tablet 4  . potassium chloride (K-DUR) 10 MEQ tablet Take 1 tablet (10 mEq total) by mouth daily as needed (take with HCTZ). (Patient taking differently: Take 10 mEq by mouth as needed (take with HCTZ). Only as needed) 30 tablet 6  . Probiotic Product (PROBIOTIC PO) Take 1 tablet by mouth daily.    [name] VITAMIN E PO Take 1 tablet by mouth daily.     No current facility-administered medications for this encounter.     Allergies  Allergen Reactions  . Erythromycin Nausea And Vomiting and Other (See Comments)    REACTION: Stomach upset  . Penicillins Hives and Shortness Of Breath    Has patient had a PCN reaction causing immediate rash, facial/tongue/throat swelling, SOB or lightheadedness with hypotension: yes Has patient had a PCN reaction causing severe rash involving mucus membranes or skin necrosis: no Has patient had a PCN reaction that required hospitalization: unknown Has patient had a PCN reaction occurring within the last 10 years: no If all of the above answers are "NO", then may proceed with Cephalosporin use.   . Ibuprofen Other (See Comments)    Pt states allergy to NSAIDS is secondary to ulcers NOT anaphylaxis. Tolerates daily 325 ASA at home    Social History   Socioeconomic History  . Marital status: Divorced    Spouse name: Not on file  . Number of children: Not on file  . Years of education: Not on file  . Highest education level: Not on file  Occupational History  . Not on file  Social Needs  . Financial resource strain: Not on file  . Food insecurity    Worry: Not on file    Inability: Not on file  . Transportation needs    Medical: Not on file    Non-medical: Not on file  Tobacco Use  . Smoking status: Never Smoker  . Smokeless tobacco: Never Used  Substance and Sexual Activity  . Alcohol use: Yes    Alcohol/week: 0.0 standard drinks    Comment: Occas  . Drug use: No  . Sexual  activity: Yes    Birth control/protection: Post-menopausal    Comment: 1st intercourse 68 yo-Fewer than 5 partners  Lifestyle  . Physical activity    Days per week: Not on file    Minutes per session: Not on file  . Stress: Not on file  Relationships  . Social Herbalist on phone: Not on file    Gets together: Not on file    Attends religious service: Not on file    Active member of club or organization:  Not on file    Attends meetings of clubs or organizations: Not on file    Relationship status: Not on file  . Intimate partner violence    Fear of current or ex partner: Not on file    Emotionally abused: Not on file    Physically abused: Not on file    Forced sexual activity: Not on file  Other Topics Concern  . Not on file  Social History Narrative  . Not on file    Family History  Problem Relation Age of Onset  . Cancer Father        prostate  . Breast cancer Paternal Aunt 68  . Diabetes Maternal Grandmother   . Heart disease Maternal Grandmother   . Heart disease Maternal Grandfather   . Melanoma Paternal Aunt   . Neuropathy Mother   . Congestive Heart Failure Mother     ROS- All systems are reviewed and negative except as per the HPI above  Physical Exam: Vitals:   12/16/18 1109  BP: 120/82  Pulse: (!) 121  Weight: 79 kg  Height: 5\' 5"  (1.651 m)   Wt Readings from Last 3 Encounters:  12/16/18 79 kg  11/25/18 78.4 kg  11/09/18 78.8 kg    Labs: Lab Results  Component Value Date   NA 140 11/11/2018   K 3.3 (L) 11/11/2018   CL 104 11/11/2018   CO2 27 11/11/2018   GLUCOSE 87 11/11/2018   BUN 16 11/11/2018   CREATININE 0.97 11/11/2018   CALCIUM 9.2 11/11/2018   MG 1.7 11/05/2018   Lab Results  Component Value Date   INR 1.1 11/05/2018   Lab Results  Component Value Date   CHOL 184 11/04/2018   HDL 50.60 11/04/2018   LDLCALC 103 (H) 10/28/2017   TRIG 259.0 (H) 11/04/2018     GEN- The patient is well appearing, alert and  oriented x 3 today.   Head- normocephalic, atraumatic Eyes-  Sclera clear, conjunctiva pink Ears- hearing intact Oropharynx- clear Neck- supple, no JVP Lymph- no cervical lymphadenopathy Lungs- Clear to ausculation bilaterally, normal work of breathing Heart- irregular  rate and rhythm, no murmurs, rubs or gallops, PMI not laterally displaced GI- soft, NT, ND, + BS Extremities- no clubbing, cyanosis, or edema MS- no significant deformity or atrophy Skin- no rash or lesion Psych- euthymic mood, full affect Neuro- strength and sensation are intact  EKG-afib at 121 bpm, qrs 82 bpm, qtc 380 ms  EchoStudy Conclusions-2020 1. Left ventricular ejection fraction, by visual estimation, is 45 to 50%. The left ventricle has normal function. There is no left ventricular hypertrophy. 2. Mild global hypokinesis. 3. Left ventricular diastolic function could not be evaluated. 4. Global right ventricle has normal systolic function.The right ventricular size is normal. No increase in right ventricular wall thickness. 5. Left atrial size was normal. 6. Right atrial size was normal. 7. The mitral valve is abnormal. Mild to moderate mitral valve regurgitation. 8. The tricuspid valve is grossly normal. Tricuspid valve regurgitation is trivial. 9. The aortic valve is tricuspid. Aortic valve regurgitation is not visualized. 10. The pulmonic valve was grossly normal. Pulmonic valve regurgitation is not visualized. 11. Normal pulmonary artery systolic pressure. 12. The inferior vena cava is normal in size with greater than 50% respiratory variability, suggesting right atrial pressure of 3 mmHg. Left Ventricle: Left ventricular ejection fraction, by visual estimation, is 45 to 50%. The left ventricle    Assessment and Plan: 1. Paroxysmal afib  Successful cardioversion but with  ERAF Discussed options We discussed multaq and flecainide but with reduced EF and prior EKG showing first degree AV block, I  feel Tikosyn would be the best option for pt  Will plan for admission 12/14 She will continue corgard 40 1 1/2 tab  Bid Bmet/mag today No benadryl use  2. CHA2DS2VASc score of at least 3 Continue eliquis 5 mg bid  No missed anticoagulation for at least 3 weeks   Butch Penny C. Carroll, [facility] [address] [place], [place] [address] [phone]

## 2018-12-16 NOTE — Telephone Encounter (Signed)
Medication list reviewed in anticipation of upcoming Tikosyn initiation. Patien will need to change from Tylenol PM to regular Tylenol (currently on her med list as Tylenol PM for pain - should not need the formulation containing Benadryl for this). She will also need to stop taking HCTZ (currently listed as prn for fluid - would recommend changing to low dose Lasix if needed).  Patient is anticoagulated on Eliquis 5mg  BID on the appropriate dose. Please ensure that patient has not missed any anticoagulation doses in the 3 weeks prior to Tikosyn initiation.   Electrolytes pending from today - will need to ensure K is at least 4 and Mg at least 1.8 (preferably closer to 2).

## 2018-12-17 ENCOUNTER — Other Ambulatory Visit (HOSPITAL_COMMUNITY)
Admission: RE | Admit: 2018-12-17 | Discharge: 2018-12-17 | Disposition: A | Discharge status: Home / Self Care | Payer: PPO | Source: Ambulatory Visit | Attending: Nurse Practitioner | Admitting: Nurse Practitioner

## 2018-12-17 DIAGNOSIS — Z20828 Contact with and (suspected) exposure to other viral communicable diseases: Secondary | ICD-10-CM | POA: Insufficient documentation

## 2018-12-17 DIAGNOSIS — Z01812 Encounter for preprocedural laboratory examination: Secondary | ICD-10-CM | POA: Diagnosis not present

## 2018-12-17 NOTE — Telephone Encounter (Signed)
Patient verbalized understanding no Tylenol PM or HCTZ PRN. If weight gain she will notify our office.

## 2018-12-18 LAB — NOVEL CORONAVIRUS, NAA (HOSP ORDER, SEND-OUT TO REF LAB; TAT 18-24 HRS): SARS-CoV-2, NAA: NOT DETECTED

## 2018-12-21 ENCOUNTER — Inpatient Hospital Stay (HOSPITAL_COMMUNITY)
Admission: RE | Admit: 2018-12-21 | Discharge: 2018-12-25 | DRG: 310 | Disposition: A | Discharge status: Home / Self Care | Payer: PPO | Attending: Internal Medicine | Admitting: Internal Medicine

## 2018-12-21 ENCOUNTER — Encounter (HOSPITAL_COMMUNITY): Payer: Self-pay | Admitting: Internal Medicine

## 2018-12-21 ENCOUNTER — Other Ambulatory Visit: Payer: Self-pay

## 2018-12-21 ENCOUNTER — Ambulatory Visit (HOSPITAL_COMMUNITY)
Admission: RE | Admit: 2018-12-21 | Discharge: 2018-12-21 | Disposition: A | Discharge status: Home / Self Care | Payer: PPO | Source: Ambulatory Visit | Attending: Nurse Practitioner | Admitting: Nurse Practitioner

## 2018-12-21 ENCOUNTER — Encounter (HOSPITAL_COMMUNITY): Payer: Self-pay | Admitting: Nurse Practitioner

## 2018-12-21 VITALS — BP 120/80 | HR 128 | Ht 65.0 in | Wt 172.8 lb

## 2018-12-21 DIAGNOSIS — I4819 Other persistent atrial fibrillation: Secondary | ICD-10-CM

## 2018-12-21 DIAGNOSIS — Z7901 Long term (current) use of anticoagulants: Secondary | ICD-10-CM | POA: Diagnosis not present

## 2018-12-21 DIAGNOSIS — Z86010 Personal history of colonic polyps: Secondary | ICD-10-CM

## 2018-12-21 DIAGNOSIS — Z880 Allergy status to penicillin: Secondary | ICD-10-CM

## 2018-12-21 DIAGNOSIS — M8580 Other specified disorders of bone density and structure, unspecified site: Secondary | ICD-10-CM | POA: Diagnosis present

## 2018-12-21 DIAGNOSIS — D6869 Other thrombophilia: Secondary | ICD-10-CM

## 2018-12-21 DIAGNOSIS — Z808 Family history of malignant neoplasm of other organs or systems: Secondary | ICD-10-CM | POA: Diagnosis not present

## 2018-12-21 DIAGNOSIS — Z20828 Contact with and (suspected) exposure to other viral communicable diseases: Secondary | ICD-10-CM | POA: Diagnosis not present

## 2018-12-21 DIAGNOSIS — Z888 Allergy status to other drugs, medicaments and biological substances status: Secondary | ICD-10-CM | POA: Diagnosis not present

## 2018-12-21 DIAGNOSIS — Z803 Family history of malignant neoplasm of breast: Secondary | ICD-10-CM

## 2018-12-21 DIAGNOSIS — M1990 Unspecified osteoarthritis, unspecified site: Secondary | ICD-10-CM | POA: Diagnosis not present

## 2018-12-21 DIAGNOSIS — Z833 Family history of diabetes mellitus: Secondary | ICD-10-CM | POA: Diagnosis not present

## 2018-12-21 DIAGNOSIS — Z79899 Other long term (current) drug therapy: Secondary | ICD-10-CM

## 2018-12-21 DIAGNOSIS — I4891 Unspecified atrial fibrillation: Secondary | ICD-10-CM | POA: Diagnosis present

## 2018-12-21 DIAGNOSIS — Z87442 Personal history of urinary calculi: Secondary | ICD-10-CM

## 2018-12-21 DIAGNOSIS — I440 Atrioventricular block, first degree: Secondary | ICD-10-CM | POA: Diagnosis present

## 2018-12-21 DIAGNOSIS — K219 Gastro-esophageal reflux disease without esophagitis: Secondary | ICD-10-CM | POA: Diagnosis not present

## 2018-12-21 DIAGNOSIS — I110 Hypertensive heart disease with heart failure: Secondary | ICD-10-CM | POA: Diagnosis not present

## 2018-12-21 DIAGNOSIS — Z881 Allergy status to other antibiotic agents status: Secondary | ICD-10-CM

## 2018-12-21 DIAGNOSIS — I472 Ventricular tachycardia: Secondary | ICD-10-CM | POA: Diagnosis present

## 2018-12-21 DIAGNOSIS — Z8249 Family history of ischemic heart disease and other diseases of the circulatory system: Secondary | ICD-10-CM | POA: Diagnosis not present

## 2018-12-21 LAB — BASIC METABOLIC PANEL
Anion gap: 11 (ref 5–15)
Anion gap: 11 (ref 5–15)
BUN: 16 mg/dL (ref 8–23)
BUN: 19 mg/dL (ref 8–23)
CO2: 23 mmol/L (ref 22–32)
CO2: 20 mmol/L — ABNORMAL LOW (ref 22–32)
Calcium: 9.6 mg/dL (ref 8.9–10.3)
Calcium: 9.5 mg/dL (ref 8.9–10.3)
Chloride: 109 mmol/L (ref 98–111)
Chloride: 109 mmol/L (ref 98–111)
Creatinine, Ser: 1.08 mg/dL — ABNORMAL HIGH (ref 0.44–1.00)
Creatinine, Ser: 1.22 mg/dL — ABNORMAL HIGH (ref 0.44–1.00)
GFR calc Af Amer: >60 mL/min (ref >60)
GFR calc Af Amer: 52 mL/min — ABNORMAL LOW (ref >60)
GFR calc non Af Amer: 52 mL/min — ABNORMAL LOW (ref >60)
GFR calc non Af Amer: 45 mL/min — ABNORMAL LOW (ref >60)
Glucose, Bld: 95 mg/dL (ref 70–99)
Glucose, Bld: 95 mg/dL (ref 70–99)
Potassium: 3.7 mmol/L (ref 3.5–5.1)
Potassium: 3.9 mmol/L (ref 3.5–5.1)
Sodium: 143 mmol/L (ref 135–145)
Sodium: 140 mmol/L (ref 135–145)

## 2018-12-21 LAB — MAGNESIUM
Magnesium: 1.8 mg/dL (ref 1.7–2.4)
Magnesium: 1.8 mg/dL (ref 1.7–2.4)

## 2018-12-21 MED ORDER — RISAQUAD PO CAPS
1.0000 | ORAL_CAPSULE | Freq: Every day | ORAL | Status: DC
  Administered 2018-12-22 – 2018-12-25 (×4): 1 via ORAL
  Filled 2018-12-21 (×4): qty 1

## 2018-12-21 MED ORDER — SODIUM CHLORIDE 0.9 % IV SOLN: 250.0000 mL | INTRAVENOUS | Status: DC | PRN

## 2018-12-21 MED ORDER — LISINOPRIL 10 MG PO TABS
10.0000 mg | ORAL_TABLET | Freq: Every day | ORAL | Status: DC
  Administered 2018-12-22 – 2018-12-25 (×4): 10 mg via ORAL
  Filled 2018-12-21 (×4): qty 1

## 2018-12-21 MED ORDER — ALBUTEROL SULFATE (2.5 MG/3ML) 0.083% IN NEBU: 3.0000 mL | INHALATION_SOLUTION | Freq: Four times a day (QID) | RESPIRATORY_TRACT | Status: DC | PRN

## 2018-12-21 MED ORDER — ACETAMINOPHEN 500 MG PO TABS
1000.0000 mg | ORAL_TABLET | Freq: Four times a day (QID) | ORAL | Status: DC | PRN
  Administered 2018-12-22 – 2018-12-25 (×7): 1000 mg via ORAL
  Filled 2018-12-21 (×8): qty 2

## 2018-12-21 MED ORDER — SODIUM CHLORIDE 0.9% FLUSH
3.0000 mL | Freq: Two times a day (BID) | INTRAVENOUS | Status: DC
  Administered 2018-12-21 – 2018-12-25 (×8): 3 mL via INTRAVENOUS

## 2018-12-21 MED ORDER — POTASSIUM CHLORIDE CRYS ER 20 MEQ PO TBCR
40.0000 meq | EXTENDED_RELEASE_TABLET | Freq: Once | ORAL | Status: AC
  Administered 2018-12-21: 40 meq via ORAL
  Filled 2018-12-21: qty 2

## 2018-12-21 MED ORDER — FLUTICASONE PROPIONATE 50 MCG/ACT NA SUSP
1.0000 | NASAL | Status: DC | PRN
  Filled 2018-12-21: qty 16

## 2018-12-21 MED ORDER — APIXABAN 5 MG PO TABS
5.0000 mg | ORAL_TABLET | Freq: Two times a day (BID) | ORAL | Status: DC
  Administered 2018-12-21 – 2018-12-25 (×8): 5 mg via ORAL
  Filled 2018-12-21 (×9): qty 1

## 2018-12-21 MED ORDER — NADOLOL 40 MG PO TABS
40.0000 mg | ORAL_TABLET | Freq: Two times a day (BID) | ORAL | Status: DC
  Administered 2018-12-21 – 2018-12-25 (×8): 40 mg via ORAL
  Filled 2018-12-21 (×10): qty 1

## 2018-12-21 MED ORDER — VITAMIN D 25 MCG (1000 UNIT) PO TABS
2000.0000 [IU] | ORAL_TABLET | Freq: Every day | ORAL | Status: DC
  Administered 2018-12-22 – 2018-12-25 (×4): 2000 [IU] via ORAL
  Filled 2018-12-21 (×4): qty 2

## 2018-12-21 MED ORDER — SODIUM CHLORIDE 0.9% FLUSH: 3.0000 mL | INTRAVENOUS | Status: DC | PRN

## 2018-12-21 MED ORDER — LORATADINE 10 MG PO TABS
10.0000 mg | ORAL_TABLET | Freq: Every day | ORAL | Status: DC | PRN
  Administered 2018-12-22 – 2018-12-25 (×3): 10 mg via ORAL
  Filled 2018-12-21 (×4): qty 1

## 2018-12-21 MED ORDER — DOFETILIDE 250 MCG PO CAPS
250.0000 ug | ORAL_CAPSULE | Freq: Two times a day (BID) | ORAL | Status: DC
  Administered 2018-12-21 – 2018-12-22 (×2): 250 ug via ORAL
  Filled 2018-12-21 (×2): qty 1

## 2018-12-21 MED ORDER — MAGNESIUM SULFATE 2 GM/50ML IV SOLN
2.0000 g | Freq: Once | INTRAVENOUS | Status: AC
  Administered 2018-12-21: 2 g via INTRAVENOUS
  Filled 2018-12-21: qty 50

## 2018-12-21 NOTE — H&P (Signed)
Expand AllCollapse All            Expand widget buttonCollapse widget button    Show:Clear all   ManualTemplateCopied  Added by:     Sherran Needs, NP   Hover for detailscustomization button                                                                                                      untitled image     Primary Care Physician: [name], DO  Referring Physician: ER f/u  Cardiologist: Dr. [name] is a 68 y.o. female with a h/o paroxysmal afib first dx March 2018. She was originally  seen  in the afib office for having afib with RVR  in the ER,  and receiving successful cardioversion 11/05/18.  Unfortunately, she was back in rapid afib at 141 bpm.   She had an echo and a ETT in 2018. EKG in rhythm in 2018 showed SR with a first degree av block.  In the past she would take an extra corgard to get back in rhythm. No outstanding triggers.      Return to afib clinic 12/16/18.  Repeat Echo showed reduced EF at 45-50% and Zio patch showed continuous afib with  HR control  at 96 bpm, max at 218 ms, minimum at 54 bpm.  Discussed antiarrythmic's with pt. With the reduced EF and evidence of first degree AV block on a prior EKG in rhythm, it would be by guidelines to avoid Multaq  and flecainide. Discussed with pt use of  tikosyn and she wants to pursue.      Back in afib, clinic for Tikosyn admit, 12/21/18. She states that her afib has been running over 100 bpm for the last   several days. It is making her more fatigued.  In the clinic v rates in the 120's. She  is getting more symptomatic with it. Her weight is stable.      Today, she denies symptoms of palpitations, chest pain, shortness of breath, orthopnea, PND, lower extremity edema, dizziness, presyncope, syncope, or neurologic sequela. The patient is tolerating medications  without difficulties and is otherwise without complaint today.           Past Medical History:    Diagnosis   Date    .   Adenomatous colon polyp   2003    .   Cholelithiasis        .   Diverticulosis of colon        .   Gastric polyps        .   GERD (gastroesophageal reflux disease)        .   Helicobacter pylori (H. pylori)        .   Hiatal hernia        .   Hypertension        .   Mitral valve prolapse        .   Nephrolithiasis        .  Osteoarthritis        .   Osteopenia   03/2017        T score -1.5 FRAX 8.9% / 0.9%.  Stable from prior DEXA 2016    .   TGA (transient global amnesia)                 Past Surgical History:    Procedure   Laterality   Date    .   ANTERIOR AND POSTERIOR REPAIR   N/A   06/14/2014        Procedure: ANTERIOR (CYSTOCELE) AND POSTERIOR REPAIR (RECTOCELE);  Surgeon: Anastasio Auerbach, MD;  Location: [facility];  Service: Gynecology;  Laterality: N/A;    .   CHOLECYSTECTOMY            .   kidney stone removal                x2    .   VAGINAL HYSTERECTOMY   N/A   06/14/2014        Procedure: HYSTERECTOMY VAGINAL;  Surgeon: Anastasio Auerbach, MD;  Location: [facility];  Service: Gynecology;  Laterality: N/A;                 Current Outpatient Medications    Medication   Sig   Dispense   Refill    .   acetaminophen (TYLENOL) 500 MG tablet   Take 1,000 mg by mouth every 6 (six) hours as needed for moderate pain or headache.            .   albuterol (VENTOLIN HFA) 108 (90 Base) MCG/ACT inhaler   Inhale 1-2 puffs into the lungs every 6 (six) hours as needed for wheezing or shortness of breath.   18 g   3    .   Ascorbic Acid (VITAMIN C) 1000 MG tablet   Take 1,000 mg by mouth daily.            .   Cholecalciferol (VITAMIN D) 50 MCG (2000 UT) tablet   Take 2,000 Units by  mouth daily.            [name]   diltiazem (CARDIZEM) 30 MG tablet   Take 1 Tablet Every 4 Hours As Needed For HR <100   45 tablet   1    .   ELIQUIS 5 MG TABS tablet   TAKE 1 TABLET BY MOUTH 2 TIMES DAILY. (Patient taking differently: Take 5 mg by mouth 2 (two) times daily. )   60 tablet   6    .   Ferrous Sulfate (IRON PO)   Take 1 tablet by mouth every other day.             .   fluticasone (FLONASE) 50 MCG/ACT nasal spray   Place 1 spray into both nostrils as needed for allergies or rhinitis.   16 g   6    .   lisinopril (ZESTRIL) 10 MG tablet   Take 1 tablet (10 mg total) by mouth daily.   100 tablet   3    .   loratadine (CLARITIN) 10 MG tablet   Take 10 mg by mouth as needed.             .   montelukast (SINGULAIR) 10 MG tablet   Take 1 tablet (10 mg total) by mouth at bedtime.   30 tablet   3    .   nadolol (CORGARD)  40 MG tablet   Take 1.5 tablets (60 mg total) by mouth 2 (two) times daily.   270 tablet   3    .   pantoprazole (PROTONIX) 40 MG tablet   Take 1 tablet (40 mg total) by mouth daily.   90 tablet   4    .   potassium chloride (K-DUR) 10 MEQ tablet   Take 1 tablet (10 mEq total) by mouth daily as needed (take with HCTZ). (Patient taking differently: Take 10 mEq by mouth as needed (take with HCTZ). Only as needed)   30 tablet   6    .   Probiotic Product (PROBIOTIC PO)   Take 1 tablet by mouth daily.            [name]   VITAMIN E PO   Take 1 tablet by mouth daily.                No current facility-administered medications for this encounter.                Allergies    Allergen   Reactions    .   Erythromycin   Nausea And Vomiting and Other (See Comments)            REACTION: Stomach upset    .   Penicillins   Hives and Shortness Of Breath            Has patient had a PCN reaction causing immediate rash, facial/tongue/throat  swelling, SOB or lightheadedness with hypotension: yes  Has patient had a PCN reaction causing severe rash involving mucus membranes or skin necrosis: no  Has patient had a PCN reaction that required hospitalization: unknown  Has patient had a PCN reaction occurring within the last 10 years: no  If all of the above answers are "NO", then may proceed with Cephalosporin use.       .   Ibuprofen   Other (See Comments)            Pt states allergy to NSAIDS is secondary to ulcers NOT anaphylaxis. Tolerates daily 325 ASA at home           Social History             Socioeconomic History    .   Marital status:   Divorced            Spouse name:   Not on file    .   Number of children:   Not on file    .   Years of education:   Not on file    .   Highest education level:   Not on file    Occupational History    .   Not on file    Tobacco Use    .   Smoking status:   Never Smoker    .   Smokeless tobacco:   Never Used    Substance and Sexual Activity    .   Alcohol use:   Yes            Alcohol/week:   0.0 standard drinks            Comment: Occas    .   Drug use:   No    .   Sexual activity:   Yes            Birth control/protection:   Post-menopausal  Comment: 1st intercourse 68 yo-Fewer than 5 partners    Other Topics   Concern    .   Not on file    Social History Narrative    .   Not on file        Social Determinants of Health           Financial Resource Strain:     .   Difficulty of Paying Living Expenses: Not on file    Food Insecurity:     .   Worried About Charity fundraiser in the Last Year: Not on file    .   Ran Out of Food in the Last Year: Not on file    Transportation Needs:     .   Lack of Transportation (Medical): Not on file    .   Lack of Transportation (Non-Medical): Not on  file    Physical Activity:     .   Days of Exercise per Week: Not on file    .   Minutes of Exercise per Session: Not on file    Stress:     .   Feeling of Stress : Not on file    Social Connections:     .   Frequency of Communication with Friends and Family: Not on file    .   Frequency of Social Gatherings with Friends and Family: Not on file    .   Attends Religious Services: Not on file    .   Active Member of Clubs or Organizations: Not on file    .   Attends Archivist Meetings: Not on file    .   Marital Status: Not on file    Intimate Partner Violence:     .   Fear of Current or Ex-Partner: Not on file    .   Emotionally Abused: Not on file    .   Physically Abused: Not on file    .   Sexually Abused: Not on file                Family History    Problem   Relation   Age of Onset    .   Cancer   Father                prostate    .   Breast cancer   Paternal Aunt   32    .   Diabetes   Maternal Grandmother        .   Heart disease   Maternal Grandmother        .   Heart disease   Maternal Grandfather        .   Melanoma   Paternal Aunt        .   Neuropathy   Mother        .   Congestive Heart Failure   Mother              ROS- All systems are reviewed and negative except as per the HPI above     Physical Exam:      Vitals:        12/21/18 1040    BP:   120/80    Pulse:   (!) 128    Weight:   78.4 kg    Height:   5\' 5"  (1.651 m)           Wt  Readings from Last 3 Encounters:    12/21/18   78.4 kg    12/16/18   79 kg    11/25/18   78.4 kg          Labs:  Recent Labs                                                                                                                                                   Recent Labs                                          Recent Labs                                                                                       GEN- The patient is well appearing, alert and oriented x 3 today.    Head- normocephalic, atraumatic  Eyes-  Sclera clear, conjunctiva pink  Ears- hearing intact  Oropharynx- clear  Neck- supple, no JVP  Lymph- no cervical lymphadenopathy  Lungs- Clear to ausculation bilaterally, normal work of breathing  Heart- irregular  rate and rhythm, no murmurs, rubs or gallops, PMI not laterally displaced  GI- soft, NT, ND, + BS  Extremities- no clubbing, cyanosis, or edema  MS- no significant deformity or atrophy  Skin- no rash or lesion  Psych- euthymic mood, full affect  Neuro- strength and sensation are intact     EKG-afib at 128 bpm, qrs 78  bpm, qtc 443 ms     EchoStudy Conclusions-2020   1. Left ventricular ejection fraction, by visual estimation, is 45 to 50%. The left ventricle has  normal function. There is no left ventricular hypertrophy.  2. Mild global hypokinesis.  3. Left ventricular diastolic function could not be evaluated.  4. Global right ventricle has normal systolic function.The right ventricular size is normal. No  increase in right ventricular wall thickness.  5. Left atrial size was normal.  6. Right atrial size was normal.  7. The mitral valve is abnormal. Mild to moderate mitral valve regurgitation.  8. The tricuspid valve is grossly normal. Tricuspid valve regurgitation is trivial.  9. The aortic valve is tricuspid. Aortic valve regurgitation is not visualized.  10. The pulmonic valve was grossly normal. Pulmonic valve regurgitation is not visualized.  11. Normal pulmonary artery systolic pressure.  12. The inferior vena cava is normal in size with greater than 50%  respiratory variability,  suggesting right atrial pressure of 3 mmHg.  Left Ventricle: Left ventricular ejection fraction, by visual estimation, is 45 to 50%. The left ventricle           Assessment and Plan:  1. Persistent  afib   Successful cardioversion but with ERAF  Discussed options  We discussed multaq and flecainide but with reduced EF and prior EKG showing first degree AV block, I feel Tikosyn would be the best option for pt   Aware of price of drug and can afford   Continue corgard 40 1 1/2 tab  Bid  esium to get her levels up by tonight to receive drug    No benadryl use, no hctz use  qtc today is 443 ms     2. CHA2DS2VASc score of at least 3  Continue eliquis 5 mg bid   No missed anticoagulation for at least 3 weeks      Bmet/mag today showed K+ at 3.7 and  mag at  1.8, cr cl at 60.56  I ordered 40 meq k+ for pt to take at lunch at home waiting on bed assignment  Also she will take 400 mg mag at [employer] [facility]. Mila Homer  [facility]  [address]  [place], [place] [address]  [phone]  EP Attending  Patient seen and examined. Agree with the findings as noted above. The patient is a pleasant 67 yo patient of Dr. Lanny Hurst who had been seen in our atrial fib clinic for peristent and uncontrolled atrial fib. She is admitted for initiation of dofetilide. She feels palpitations and has had a difficult to control atrial fib. I have discussed the treatment options with the patient and she is willing to proceed with initiation of dofetilide. Her QT is acceptable and she has not missed any of her anti-coagulation.  Mikle Bosworth.D.

## 2018-12-21 NOTE — Progress Notes (Signed)
Paged by pharmacy for creatinine clearance of 44, which is too low for 500 mg dose of tikosyn. In consultation with Dr. Meda Coffee, will change to 250 mg dose until she can be evaluated by EP in the morning.

## 2018-12-21 NOTE — Progress Notes (Addendum)
Pharmacy: Dofetilide (Tikosyn) - Initial Consult Assessment and Electrolyte Replacement  Pharmacy consulted to assist in monitoring and replacing electrolytes in this 68 y.o. female admitted on 12/21/2018 undergoing dofetilide initiation of home therapy. First inpatient dofetilide dose: 12/21/18 at 2000PM.  Assessment:  Patient Exclusion Criteria: If any screening criteria checked as "Yes", then  patient  should NOT receive dofetilide until criteria item is corrected.  If "Yes" please indicate correction plan.  YES  NO Patient  Exclusion Criteria Correction Plan   [x]   []   Baseline QTc interval is greater than or equal to 440 msec. IF above YES box checked dofetilide contraindicated unless patient has ICD; then may proceed if QTc 500-550 msec or with known ventricular conduction abnormalities may proceed with QTc 550-600 msec. QTc =  443 Roderic Palau, ANP-C aware of 443 (just slightly above 440) - ok for initiation of Tikosyn.    []   [x]   Patient is known or suspected to have a digoxin level greater than 2 ng/ml: No results found for: DIGOXIN     []   [x]   Creatinine clearance less than 20 ml/min (calculated using Cockcroft-Gault, actual body weight and serum creatinine): Estimated Creatinine Clearance: 50.9 mL/min (A) (by C-G formula based on SCr of 1.08 mg/dL (H)).     []   [x]  Patient has received drugs known to prolong the QT intervals within the last 48 hours (phenothiazines, tricyclics or tetracyclic antidepressants, erythromycin, H-1 antihistamines, cisapride, fluoroquinolones, azithromycin). Drugs not listed above may have an, as yet, undetected potential to prolong the QT interval, updated information on QT prolonging agents is available at this website:QT prolonging agents or www.crediblemeds.org    []   [x]   Patient received a dose of hydrochlorothiazide (Oretic) alone or in any combination including triamterene (Dyazide, Maxzide) in the last 48 hours.    []   [x]   Patient received a medication known to increase dofetilide plasma concentrations prior to initial dofetilide dose:  . Trimethoprim (Primsol, Proloprim) in the last 36 hours . Verapamil (Calan, Verelan) in the last 36 hours or a sustained release dose in the last 72 hours . Megestrol (Megace) in the last 5 days  . Cimetidine (Tagamet) in the last 6 hours . Ketoconazole (Nizoral) in the last 24 hours . Itraconazole (Sporanox) in the last 48 hours  . Prochlorperazine (Compazine) in the last 36 hours     []   [x]   Patient is known to have a history of torsades de pointes; congenital or acquired long QT syndromes.    []   [x]   Patient has received a Class 1 antiarrhythmic with less than 2 half-lives since last dose. (Disopyramide, Quinidine, Procainamide, Lidocaine, Mexiletine, Flecainide, Propafenone)    []   [x]   Patient has received amiodarone therapy in the past 3 months or amiodarone level is greater than 0.3 ng/ml.    Patient has been appropriately anticoagulated with Eliquis.  Labs:    Component Value Date/Time   K 3.9 12/21/2018 1748   MG 1.8 12/21/2018 1748   Note that patient received 40 meq of K+ at lunch today with 400 mg of magnesium to get levels up for start to Tikosyn tonight. These levels are ~5 hours post initial replacement.   Plan: Potassium: K 3.8-3.9:  Hold Tikosyn initiation and give KCl 40 mEq po x1 then begin Tikosyn at least 2hr after KCl dose - do not need to recheck K   Magnesium: Mg 1.8-2: Give Mg 2 gm IV x1 to prevent Mg from dropping below 1.8 - do not need  to recheck Mg. Appropriate to initiate Tikosyn   *Dose high for current CrCl - discussed with Cardiology. Dose changed to 250 mcg po BID for CrCl between 40 -60 mL/min.  Monitor.   Thank you for allowing pharmacy to participate in this patient's care   Brain Hilts 12/21/2018  5:22 PM

## 2018-12-21 NOTE — Plan of Care (Signed)
  Problem: Health Behavior/Discharge Planning: Goal: Ability to manage health-related needs will improve Outcome: Progressing   Problem: Clinical Measurements: Goal: Respiratory complications will improve Outcome: Progressing   Problem: Nutrition: Goal: Adequate nutrition will be maintained Outcome: Progressing   Problem: Coping: Goal: Level of anxiety will decrease Outcome: Progressing

## 2018-12-21 NOTE — Progress Notes (Signed)
Primary Care Physician: [name], DO Referring Physician: ER f/u Cardiologist: Dr. [name] is a 68 y.o. female with a h/o paroxysmal afib first dx March 2018. She was originally  seen  in the afib office for having afib with RVR  in the ER,  and receiving successful cardioversion 11/05/18. Unfortunately, she was back in rapid afib at 141 bpm.   She had an echo and a ETT in 2018. EKG in rhythm in 2018 showed SR with a first degree av block.  In the past she would take an extra corgard to get back in rhythm. No outstanding triggers.   Return to afib clinic 12/16/18.  Repeat Echo showed reduced EF at 45-50% and Zio patch showed continuous afib with  HR control  at 96 bpm, max at 218 ms, minimum at 54 bpm.  Discussed antiarrythmic's with pt. With the reduced EF and evidence of first degree AV block on a prior EKG in rhythm, it would be by guidelines to avoid Multaq  and flecainide. Discussed with pt use of  tikosyn and she wants to pursue.   Back in afib, clinic for Tikosyn admit, 12/21/18. She states that her afib has been running over 100 bpm for the last   several days. It is making her more fatigued.  In the clinic v rates in the 120's. She  is getting more symptomatic with it. Her weight is stable.   Today, she denies symptoms of palpitations, chest pain, shortness of breath, orthopnea, PND, lower extremity edema, dizziness, presyncope, syncope, or neurologic sequela. The patient is tolerating medications without difficulties and is otherwise without complaint today.   Past Medical History:  Diagnosis Date  . Adenomatous colon polyp 2003  . Cholelithiasis   . Diverticulosis of colon   . Gastric polyps   . GERD (gastroesophageal reflux disease)   . Helicobacter pylori (H. pylori)   . Hiatal hernia   . Hypertension   . Mitral valve prolapse   . Nephrolithiasis   . Osteoarthritis   . Osteopenia 03/2017   T score -1.5 FRAX 8.9% / 0.9%.  Stable from prior DEXA 2016    . TGA (transient global amnesia)    Past Surgical History:  Procedure Laterality Date  . ANTERIOR AND POSTERIOR REPAIR N/A 06/14/2014   Procedure: ANTERIOR (CYSTOCELE) AND POSTERIOR REPAIR (RECTOCELE);  Surgeon: Anastasio Auerbach, MD;  Location: [facility];  Service: Gynecology;  Laterality: N/A;  . CHOLECYSTECTOMY    . kidney stone removal     x2  . VAGINAL HYSTERECTOMY N/A 06/14/2014   Procedure: HYSTERECTOMY VAGINAL;  Surgeon: Anastasio Auerbach, MD;  Location: [facility];  Service: Gynecology;  Laterality: N/A;    Current Outpatient Medications  Medication Sig Dispense Refill  . acetaminophen (TYLENOL) 500 MG tablet Take 1,000 mg by mouth every 6 (six) hours as needed for moderate pain or headache.    . albuterol (VENTOLIN HFA) 108 (90 Base) MCG/ACT inhaler Inhale 1-2 puffs into the lungs every 6 (six) hours as needed for wheezing or shortness of breath. 18 g 3  . Ascorbic Acid (VITAMIN C) 1000 MG tablet Take 1,000 mg by mouth daily.    . Cholecalciferol (VITAMIN D) 50 MCG (2000 UT) tablet Take 2,000 Units by mouth daily.    [name] diltiazem (CARDIZEM) 30 MG tablet Take 1 Tablet Every 4 Hours As Needed For HR <100 45 tablet 1  . ELIQUIS 5 MG TABS tablet TAKE 1 TABLET BY MOUTH 2 TIMES  DAILY. (Patient taking differently: Take 5 mg by mouth 2 (two) times daily. ) 60 tablet 6  . Ferrous Sulfate (IRON PO) Take 1 tablet by mouth every other day.     . fluticasone (FLONASE) 50 MCG/ACT nasal spray Place 1 spray into both nostrils as needed for allergies or rhinitis. 16 g 6  . lisinopril (ZESTRIL) 10 MG tablet Take 1 tablet (10 mg total) by mouth daily. 100 tablet 3  . loratadine (CLARITIN) 10 MG tablet Take 10 mg by mouth as needed.     . montelukast (SINGULAIR) 10 MG tablet Take 1 tablet (10 mg total) by mouth at bedtime. 30 tablet 3  . nadolol (CORGARD) 40 MG tablet Take 1.5 tablets (60 mg total) by mouth 2 (two) times daily. 270 tablet 3  . pantoprazole (PROTONIX) 40 MG tablet Take 1 tablet (40 mg total)  by mouth daily. 90 tablet 4  . potassium chloride (K-DUR) 10 MEQ tablet Take 1 tablet (10 mEq total) by mouth daily as needed (take with HCTZ). (Patient taking differently: Take 10 mEq by mouth as needed (take with HCTZ). Only as needed) 30 tablet 6  . Probiotic Product (PROBIOTIC PO) Take 1 tablet by mouth daily.    [name] VITAMIN E PO Take 1 tablet by mouth daily.     No current facility-administered medications for this encounter.    Allergies  Allergen Reactions  . Erythromycin Nausea And Vomiting and Other (See Comments)    REACTION: Stomach upset  . Penicillins Hives and Shortness Of Breath    Has patient had a PCN reaction causing immediate rash, facial/tongue/throat swelling, SOB or lightheadedness with hypotension: yes Has patient had a PCN reaction causing severe rash involving mucus membranes or skin necrosis: no Has patient had a PCN reaction that required hospitalization: unknown Has patient had a PCN reaction occurring within the last 10 years: no If all of the above answers are "NO", then may proceed with Cephalosporin use.   . Ibuprofen Other (See Comments)    Pt states allergy to NSAIDS is secondary to ulcers NOT anaphylaxis. Tolerates daily 325 ASA at home    Social History   Socioeconomic History  . Marital status: Divorced    Spouse name: Not on file  . Number of children: Not on file  . Years of education: Not on file  . Highest education level: Not on file  Occupational History  . Not on file  Tobacco Use  . Smoking status: Never Smoker  . Smokeless tobacco: Never Used  Substance and Sexual Activity  . Alcohol use: Yes    Alcohol/week: 0.0 standard drinks    Comment: Occas  . Drug use: No  . Sexual activity: Yes    Birth control/protection: Post-menopausal    Comment: 1st intercourse 68 yo-Fewer than 5 partners  Other Topics Concern  . Not on file  Social History Narrative  . Not on file   Social Determinants of Health   Financial Resource Strain:     . Difficulty of Paying Living Expenses: Not on file  Food Insecurity:   . Worried About Charity fundraiser in the Last Year: Not on file  . Ran Out of Food in the Last Year: Not on file  Transportation Needs:   . Lack of Transportation (Medical): Not on file  . Lack of Transportation (Non-Medical): Not on file  Physical Activity:   . Days of Exercise per Week: Not on file  . Minutes of Exercise per Session: Not on  file  Stress:   . Feeling of Stress : Not on file  Social Connections:   . Frequency of Communication with Friends and Family: Not on file  . Frequency of Social Gatherings with Friends and Family: Not on file  . Attends Religious Services: Not on file  . Active Member of Clubs or Organizations: Not on file  . Attends Archivist Meetings: Not on file  . Marital Status: Not on file  Intimate Partner Violence:   . Fear of Current or Ex-Partner: Not on file  . Emotionally Abused: Not on file  . Physically Abused: Not on file  . Sexually Abused: Not on file    Family History  Problem Relation Age of Onset  . Cancer Father        prostate  . Breast cancer Paternal Aunt 37  . Diabetes Maternal Grandmother   . Heart disease Maternal Grandmother   . Heart disease Maternal Grandfather   . Melanoma Paternal Aunt   . Neuropathy Mother   . Congestive Heart Failure Mother     ROS- All systems are reviewed and negative except as per the HPI above  Physical Exam: Vitals:   12/21/18 1040  BP: 120/80  Pulse: (!) 128  Weight: 78.4 kg  Height: 5\' 5"  (1.651 m)   Wt Readings from Last 3 Encounters:  12/21/18 78.4 kg  12/16/18 79 kg  11/25/18 78.4 kg    Labs: Lab Results  Component Value Date   NA 141 12/16/2018   K 4.0 12/16/2018   CL 108 12/16/2018   CO2 23 12/16/2018   GLUCOSE 94 12/16/2018   BUN 12 12/16/2018   CREATININE 1.06 (H) 12/16/2018   CALCIUM 9.2 12/16/2018   MG 2.0 12/16/2018   Lab Results  Component Value Date   INR 1.1 11/05/2018    Lab Results  Component Value Date   CHOL 184 11/04/2018   HDL 50.60 11/04/2018   LDLCALC 103 (H) 10/28/2017   TRIG 259.0 (H) 11/04/2018     GEN- The patient is well appearing, alert and oriented x 3 today.   Head- normocephalic, atraumatic Eyes-  Sclera clear, conjunctiva pink Ears- hearing intact Oropharynx- clear Neck- supple, no JVP Lymph- no cervical lymphadenopathy Lungs- Clear to ausculation bilaterally, normal work of breathing Heart- irregular  rate and rhythm, no murmurs, rubs or gallops, PMI not laterally displaced GI- soft, NT, ND, + BS Extremities- no clubbing, cyanosis, or edema MS- no significant deformity or atrophy Skin- no rash or lesion Psych- euthymic mood, full affect Neuro- strength and sensation are intact  EKG-afib at 128 bpm, qrs 78  bpm, qtc 443 ms  EchoStudy Conclusions-2020 1. Left ventricular ejection fraction, by visual estimation, is 45 to 50%. The left ventricle has normal function. There is no left ventricular hypertrophy. 2. Mild global hypokinesis. 3. Left ventricular diastolic function could not be evaluated. 4. Global right ventricle has normal systolic function.The right ventricular size is normal. No increase in right ventricular wall thickness. 5. Left atrial size was normal. 6. Right atrial size was normal. 7. The mitral valve is abnormal. Mild to moderate mitral valve regurgitation. 8. The tricuspid valve is grossly normal. Tricuspid valve regurgitation is trivial. 9. The aortic valve is tricuspid. Aortic valve regurgitation is not visualized. 10. The pulmonic valve was grossly normal. Pulmonic valve regurgitation is not visualized. 11. Normal pulmonary artery systolic pressure. 12. The inferior vena cava is normal in size with greater than 50% respiratory variability, suggesting right atrial pressure of  3 mmHg. Left Ventricle: Left ventricular ejection fraction, by visual estimation, is 45 to 50%. The left  ventricle    Assessment and Plan: 1. Persistent  afib  Successful cardioversion but with ERAF Discussed options We discussed multaq and flecainide but with reduced EF and prior EKG showing first degree AV block, I feel Tikosyn would be the best option for pt  Aware of price of drug and can afford  Continue corgard 40 1 1/2 tab  Bid esium to get her levels up by tonight to receive drug   No benadryl use, no hctz use qtc today is 443 ms  2. CHA2DS2VASc score of at least 3 Continue eliquis 5 mg bid  No missed anticoagulation for at least 3 weeks   Bmet/mag today showed K+ at 3.7 and  mag at  1.8, cr cl at 60.56 I ordered 40 meq k+ for pt to take at lunch at home waiting on bed assignment Also she will take 400 mg mag at [employer] C. Tisa Weisel, [facility] [address] [place], [place] [address] [phone]

## 2018-12-22 LAB — PROTIME-INR
INR: 1.3 — ABNORMAL HIGH (ref 0.8–1.2)
Prothrombin Time: 15.6 seconds — ABNORMAL HIGH (ref 11.4–15.2)

## 2018-12-22 LAB — BASIC METABOLIC PANEL
Anion gap: 8 (ref 5–15)
BUN: 17 mg/dL (ref 8–23)
CO2: 21 mmol/L — ABNORMAL LOW (ref 22–32)
Calcium: 9 mg/dL (ref 8.9–10.3)
Chloride: 111 mmol/L (ref 98–111)
Creatinine, Ser: 0.94 mg/dL (ref 0.44–1.00)
GFR calc Af Amer: >60 mL/min (ref >60)
GFR calc non Af Amer: >60 mL/min (ref >60)
Glucose, Bld: 98 mg/dL (ref 70–99)
Potassium: 4 mmol/L (ref 3.5–5.1)
Sodium: 140 mmol/L (ref 135–145)

## 2018-12-22 LAB — MAGNESIUM: Magnesium: 2 mg/dL (ref 1.7–2.4)

## 2018-12-22 MED ORDER — DOFETILIDE 500 MCG PO CAPS
500.0000 ug | ORAL_CAPSULE | Freq: Two times a day (BID) | ORAL | Status: DC
  Administered 2018-12-22 – 2018-12-23 (×3): 500 ug via ORAL
  Filled 2018-12-22 (×4): qty 1

## 2018-12-22 MED ORDER — DOFETILIDE 250 MCG PO CAPS
250.0000 ug | ORAL_CAPSULE | Freq: Once | ORAL | Status: AC
  Administered 2018-12-22: 250 ug via ORAL
  Filled 2018-12-22: qty 1

## 2018-12-22 MED ORDER — MONTELUKAST SODIUM 10 MG PO TABS
10.0000 mg | ORAL_TABLET | Freq: Every day | ORAL | Status: DC
  Administered 2018-12-22 – 2018-12-24 (×3): 10 mg via ORAL
  Filled 2018-12-22 (×3): qty 1

## 2018-12-22 NOTE — Progress Notes (Addendum)
Electrophysiology Rounding Note  Patient Name: Anna Marquez Date of Encounter: 12/22/2018  Primary Cardiologist: Mertie Moores, MD  Electrophysiologist: New to EP   Subjective   Pt remains in afib on Tikosyn 250 mcg BID   QTc from EKG last pm shows stable QTc at ~460 ms  The patient is doing well today.  At this time, the patient denies chest pain, shortness of breath, or any new concerns.  Inpatient Medications    Scheduled Meds: . acidophilus  1 capsule Oral Daily  . apixaban  5 mg Oral BID  . cholecalciferol  2,000 Units Oral Daily  . dofetilide  250 mcg Oral BID  . lisinopril  10 mg Oral Daily  . nadolol  40 mg Oral BID  . sodium chloride flush  3 mL Intravenous Q12H   Continuous Infusions: . sodium chloride     PRN Meds: sodium chloride, acetaminophen, albuterol, fluticasone, loratadine, sodium chloride flush   Vital Signs    Vitals:   12/22/18 0100 12/22/18 0546 12/22/18 0548 12/22/18 0804  BP:   (!) 124/96 119/86  Pulse: 89  100   Resp:      Temp:  98.2 F (36.8 C)    TempSrc:  Oral    SpO2: 93%  96%   Weight:  78.1 kg    Height:        Intake/Output Summary (Last 24 hours) at 12/22/2018 0854 Last data filed at 12/22/2018 0800 Gross per 24 hour  Intake 272 ml  Output 200 ml  Net 72 ml   Filed Weights   12/21/18 1700 12/22/18 0546  Weight: 77.8 kg 78.1 kg    Physical Exam    GEN- The patient is well appearing, alert and oriented x 3 today.   Head- normocephalic, atraumatic Eyes-  Sclera clear, conjunctiva pink Ears- hearing intact Oropharynx- clear Neck- supple Lungs- Clear to ausculation bilaterally, normal work of breathing Heart- Regular rate and rhythm, no murmurs, rubs or gallops GI- soft, NT, ND, + BS Extremities- no clubbing, cyanosis, or edema Skin- no rash or lesion Psych- euthymic mood, full affect Neuro- strength and sensation are intact  Labs    CBC No results for input(s): WBC, NEUTROABS, HGB, HCT, MCV, PLT in  the last 72 hours. Basic Metabolic Panel Recent Labs    12/21/18 1748 12/22/18 0349  NA 140 140  K 3.9 4.0  CL 109 111  CO2 20* 21*  GLUCOSE 95 98  BUN 19 17  CREATININE 1.22* 0.94  CALCIUM 9.5 9.0  MG 1.8 2.0    Potassium  Date/Time Value Ref Range Status  12/22/2018 03:49 AM 4.0 3.5 - 5.1 mmol/L Final   Magnesium  Date/Time Value Ref Range Status  12/22/2018 03:49 AM 2.0 1.7 - 2.4 mg/dL Final    Comment:    Performed at [facility], [place] [address] [place], [place] [address]    Telemetry    Afib 90-100s (personally reviewed)  Radiology    No results found.  Patient Profile     Anna Marquez is a 68 y.o. female with a past medical history significant for persistent atrial fibrillation.  They were admitted for tikosyn load.   Assessment & Plan    1. Persistent atrial fibrillation Pt remains in afib on Tikosyn 250 mcg BID. This was dose adjusted due to calculated CrCl on admission. I will discuss with pharmacy. As I think she would likely tolerate the 500 mcg dosing with Estimated Creatinine Clearance: 57.2 mL/min (  by C-G formula based on SCr of 0.94 mg/dL). Continue Eliquis Electrolytes stable.  CHA2DS2VASC is 5  If pt does not convert chemically, plan on DCCV tomorrow   ADDENDUM Discussed and reviewed CrCl with pharmacy. She is appropriate for 500 mcg dosing. Given an extra 250 mcg this am to total 500, and 500 ordered for this pm.   For questions or updates, please contact [place] Please consult [email or web address] for contact info under Cardiology/STEMI.  Signed, Shirley Friar, PA-C  12/22/2018, 8:54 AM    I have seen, examined the patient, and reviewed the above assessment and plan.  Changes to above are made where necessary.  On exam, iRRR.  QT is stable.  Continue eliquis.  May require cardioversion if still in AF tomorrow.   Co Sign: Thompson Grayer, MD 12/22/2018 3:38 PM

## 2018-12-22 NOTE — TOC Benefit Eligibility Note (Signed)
Transition of Care [facility]) Benefit Eligibility Note    Patient Details  Name: JEANELLE DAKE MRN: [id number] Date of Birth: [birth year]   Medication/Dose: DOFETILIDE  125 MCG   250 MCG 500 MCG BID  Covered?: Yes  Tier: (TIER- 4 DRUG)  Prescription Coverage Preferred Pharmacy: [facility] with Person/Company/Phone Number:: CARLO   @ [employer] # [phone] OPT-2  Co-Pay: $ 90.00  FOR EACH PRISCRIPTION  Prior Approval: No  Deductible: Met  Additional Notes: TIKOSYN  125 MCG 250 MCG 500 MCG : NON-FORMULARY , P/A # [id number] OPT- 3, OPT-2    Memory Argue Phone Number: 12/22/2018, 12:58 PM

## 2018-12-22 NOTE — Progress Notes (Signed)
Morning EKG reviewed  Shows pt remains in afib (P-P irregular) at 97 bpm with stable QTc at ~460 ms.  Continue Tikosyn 500 mcg BID.   Pt will be NPO after midnight for DCCV if remains in [place], [place]  Pager: [phone]  12/22/2018 11:23 AM

## 2018-12-22 NOTE — Care Management (Signed)
12-22-18 1123 Pt presented for Tikosyn Load- Benefits check submitted for cost. Case Manager will make patient aware of cost once completed. Patient will need Rx for 7 day supply sent to the Transition of Care Pharmacy to be filled prior to transition home and delivered to bedside. Patient will need Rx for 90 day supply with refills sent to [facility]. No further needs from Case Manager at this time. Bethena Roys, RN,BSN Case Manager.

## 2018-12-22 NOTE — Progress Notes (Signed)
Pharmacy: Dofetilide (Tikosyn) - Follow Up Assessment and Electrolyte Replacement  Pharmacy consulted to assist in monitoring and replacing electrolytes in this 68 y.o. female admitted on 12/21/2018 undergoing dofetilide initiation.  Labs:    Component Value Date/Time   K 4.0 12/22/2018 0349   MG 2.0 12/22/2018 0349     Plan: Potassium: K >/= 4: No additional supplementation needed  Magnesium: Mg > 2: No additional supplementation needed  Thank you for allowing pharmacy to participate in this patient's care   Hildred Laser, PharmD Clinical Pharmacist **Pharmacist phone directory can now be found on [email or web address] ([email or web address]).  Listed under [place].

## 2018-12-23 ENCOUNTER — Encounter (HOSPITAL_COMMUNITY): Payer: Self-pay | Admitting: Certified Registered"

## 2018-12-23 ENCOUNTER — Encounter (HOSPITAL_COMMUNITY): Payer: Self-pay | Admitting: Internal Medicine

## 2018-12-23 ENCOUNTER — Encounter (HOSPITAL_COMMUNITY)
Admission: RE | Disposition: A | Discharge status: Home / Self Care | Payer: Self-pay | Source: Ambulatory Visit | Attending: Internal Medicine

## 2018-12-23 LAB — BASIC METABOLIC PANEL
Anion gap: 13 (ref 5–15)
BUN: 18 mg/dL (ref 8–23)
CO2: 19 mmol/L — ABNORMAL LOW (ref 22–32)
Calcium: 8.9 mg/dL (ref 8.9–10.3)
Chloride: 107 mmol/L (ref 98–111)
Creatinine, Ser: 0.99 mg/dL (ref 0.44–1.00)
GFR calc Af Amer: >60 mL/min (ref >60)
GFR calc non Af Amer: 58 mL/min — ABNORMAL LOW (ref >60)
Glucose, Bld: 83 mg/dL (ref 70–99)
Potassium: 4.2 mmol/L (ref 3.5–5.1)
Sodium: 139 mmol/L (ref 135–145)

## 2018-12-23 LAB — MAGNESIUM: Magnesium: 1.8 mg/dL (ref 1.7–2.4)

## 2018-12-23 SURGERY — CARDIOVERSION
Anesthesia: General

## 2018-12-23 MED ORDER — MAGNESIUM SULFATE 2 GM/50ML IV SOLN
2.0000 g | Freq: Once | INTRAVENOUS | Status: AC
  Administered 2018-12-23: 2 g via INTRAVENOUS
  Filled 2018-12-23: qty 50

## 2018-12-23 NOTE — Progress Notes (Signed)
Pt converted to NSR. EKG placed in chart. [place], [place] notified and aware. Will continue to monitor.

## 2018-12-23 NOTE — Progress Notes (Signed)
Pharmacy: Dofetilide (Tikosyn) - Follow Up Assessment and Electrolyte Replacement  Pharmacy consulted to assist in monitoring and replacing electrolytes in this 68 y.o. female admitted on 12/21/2018 undergoing dofetilide initiation.   Labs:    Component Value Date/Time   K 4.2 12/23/2018 0239   MG 1.8 12/23/2018 0239     Plan: Potassium: K >/= 4: No additional supplementation needed  Magnesium: Mg= 1.8; Mg 2gm IV ordered per MD  Thank you for allowing pharmacy to participate in this patient's care   Hildred Laser, PharmD Clinical Pharmacist **Pharmacist phone directory can now be found on [email or web address] ([email or web address]).  Listed under [place].

## 2018-12-23 NOTE — Progress Notes (Addendum)
Morning EKG reviewed  Shows has converted to NSR at 70 bpm with stable QTc ~ 480 ms when measured manually and corrected for rate.  Continue Tikosyn 500 mcg BID.   Pt will not require DCCV   Annamaria Helling  Pager: [phone]  12/23/2018 12:54 PM

## 2018-12-23 NOTE — Progress Notes (Addendum)
Electrophysiology Rounding Note  Patient Name: Anna Marquez Date of Encounter: 12/23/2018  Primary Cardiologist: Mertie Moores, MD  Electrophysiologist: New to Dr. Rayann Heman   Subjective   Pt remains in afib on Tikosyn 500 mcg BID   QTc from EKG last pm shows stable QTc at ~380-400 in AF  The patient is doing well today.  At this time, the patient denies chest pain, shortness of breath, or any new concerns.  Inpatient Medications    Scheduled Meds: . acidophilus  1 capsule Oral Daily  . apixaban  5 mg Oral BID  . cholecalciferol  2,000 Units Oral Daily  . dofetilide  500 mcg Oral BID  . lisinopril  10 mg Oral Daily  . montelukast  10 mg Oral QHS  . nadolol  40 mg Oral BID  . sodium chloride flush  3 mL Intravenous Q12H   Continuous Infusions: . sodium chloride     PRN Meds: sodium chloride, acetaminophen, albuterol, fluticasone, loratadine, sodium chloride flush   Vital Signs    Vitals:   12/22/18 1418 12/22/18 2223 12/23/18 0511 12/23/18 0815  BP: 124/89 (!) 121/97 (!) 119/91 (!) 114/95  Pulse: 91 83 (!) 108   Resp: 16  18   Temp: 97.8 F (36.6 C) 98.2 F (36.8 C) 98.1 F (36.7 C)   TempSrc: Oral Oral Oral   SpO2: 96% 96% 98% 100%  Weight:   77.8 kg   Height:        Intake/Output Summary (Last 24 hours) at 12/23/2018 0820 Last data filed at 12/23/2018 0600 Gross per 24 hour  Intake 924 ml  Output 2100 ml  Net -1176 ml   Filed Weights   12/21/18 1700 12/22/18 0546 12/23/18 0511  Weight: 77.8 kg 78.1 kg 77.8 kg    Physical Exam    GEN- The patient is well appearing, alert and oriented x 3 today.   Head- normocephalic, atraumatic Eyes-  Sclera clear, conjunctiva pink Ears- hearing intact Oropharynx- clear Neck- supple Lungs- Clear to ausculation bilaterally, normal work of breathing Heart- Regular rate and rhythm, no murmurs, rubs or gallops GI- soft, NT, ND, + BS Extremities- no clubbing, cyanosis, or edema Skin- no rash or lesion Psych-  euthymic mood, full affect Neuro- strength and sensation are intact  Labs    CBC No results for input(s): WBC, NEUTROABS, HGB, HCT, MCV, PLT in the last 72 hours. Basic Metabolic Panel Recent Labs    12/22/18 0349 12/23/18 0239  NA 140 139  K 4.0 4.2  CL 111 107  CO2 21* 19*  GLUCOSE 98 83  BUN 17 18  CREATININE 0.94 0.99  CALCIUM 9.0 8.9  MG 2.0 1.8    Potassium  Date/Time Value Ref Range Status  12/23/2018 02:39 AM 4.2 3.5 - 5.1 mmol/L Final   Magnesium  Date/Time Value Ref Range Status  12/23/2018 02:39 AM 1.8 1.7 - 2.4 mg/dL Final    Comment:    Performed at [facility], [place] [address] [place], [place] [address]    Telemetry    Afib 90-110s (personally reviewed)  Radiology    No results found.   Patient Profile     Anna Marquez is a 68 y.o. female with a past medical history significant for persistent atrial fibrillation.  They were admitted for tikosyn load.   Assessment & Plan    1. Persistent atrial fibrillation Pt remains in afib on Tikosyn 500 mcg BID  Continue Eliquis K 4.2. Mg 1.8, will  supp.   CHA2DS2VASC is 5  If pt does not convert chemically, plan on DCCV this afternoon with Dr. Harrell Gave   For questions or updates, please contact [place] Please consult [email or web address] for contact info under Cardiology/STEMI.  Signed, Shirley Friar, PA-C  12/23/2018, 8:20 AM    Qt is stable.  Continue tikosyn load.  Thompson Grayer ., MD

## 2018-12-24 LAB — BASIC METABOLIC PANEL
Anion gap: 12 (ref 5–15)
BUN: 15 mg/dL (ref 8–23)
CO2: 22 mmol/L (ref 22–32)
Calcium: 9.3 mg/dL (ref 8.9–10.3)
Chloride: 106 mmol/L (ref 98–111)
Creatinine, Ser: 0.98 mg/dL (ref 0.44–1.00)
GFR calc Af Amer: >60 mL/min (ref >60)
GFR calc non Af Amer: 59 mL/min — ABNORMAL LOW (ref >60)
Glucose, Bld: 89 mg/dL (ref 70–99)
Potassium: 3.9 mmol/L (ref 3.5–5.1)
Sodium: 140 mmol/L (ref 135–145)

## 2018-12-24 LAB — MAGNESIUM: Magnesium: 2 mg/dL (ref 1.7–2.4)

## 2018-12-24 MED ORDER — POTASSIUM CHLORIDE CRYS ER 20 MEQ PO TBCR
20.0000 meq | EXTENDED_RELEASE_TABLET | Freq: Once | ORAL | Status: AC
  Administered 2018-12-24: 20 meq via ORAL
  Filled 2018-12-24: qty 1

## 2018-12-24 MED ORDER — DOFETILIDE 250 MCG PO CAPS
250.0000 ug | ORAL_CAPSULE | Freq: Two times a day (BID) | ORAL | Status: DC
  Administered 2018-12-24 – 2018-12-25 (×3): 250 ug via ORAL
  Filled 2018-12-24 (×3): qty 1

## 2018-12-24 NOTE — Progress Notes (Signed)
Morning EKG reviewed  Shows pt remains in afib at 111 bpm with stable QTc at ~480 ms, when measured manually and corrected for rate  Continue Tikosyn 250 mcg BID. (Decreased this am)  Pt will be NPO after midnight for DCCV if remains in [place], [place]  Pager: [phone]  12/24/2018 12:12 PM

## 2018-12-24 NOTE — Progress Notes (Addendum)
Electrophysiology Rounding Note  Patient Name: Anna Marquez Date of Encounter: 12/24/2018  Primary Cardiologist: Mertie Moores, MD  Electrophysiologist: Dr. Rayann Heman   Subjective   Pt converted to NSR, but unfortunately reverted back to AF ~2300 last night on Tikosyn 500 mcg BID   QTc from EKG last pm shows prolonged QTc at ~500-510 ms  The patient is doing well today, but slightly frustrated to be back out of rhythm.  At this time, the patient denies chest pain, shortness of breath, or any new concerns.  Inpatient Medications    Scheduled Meds: . acidophilus  1 capsule Oral Daily  . apixaban  5 mg Oral BID  . cholecalciferol  2,000 Units Oral Daily  . dofetilide  250 mcg Oral BID  . lisinopril  10 mg Oral Daily  . montelukast  10 mg Oral QHS  . nadolol  40 mg Oral BID  . sodium chloride flush  3 mL Intravenous Q12H   Continuous Infusions: . sodium chloride     PRN Meds: sodium chloride, acetaminophen, albuterol, fluticasone, loratadine, sodium chloride flush   Vital Signs    Vitals:   12/23/18 1527 12/23/18 2018 12/24/18 0428 12/24/18 0430  BP: 127/85 120/90 122/83   Pulse: 68 83 86   Resp: 17 (!) 21 15   Temp: 98.1 F (36.7 C) 97.9 F (36.6 C) 97.8 F (36.6 C)   TempSrc: Oral Oral Oral   SpO2: 96% 98% 98%   Weight:    76.8 kg  Height:        Intake/Output Summary (Last 24 hours) at 12/24/2018 0844 Last data filed at 12/24/2018 0700 Gross per 24 hour  Intake 1145 ml  Output 2900 ml  Net -1755 ml   Filed Weights   12/22/18 0546 12/23/18 0511 12/24/18 0430  Weight: 78.1 kg 77.8 kg 76.8 kg    Physical Exam    GEN- The patient is well appearing, alert and oriented x 3 today.   Head- normocephalic, atraumatic Eyes-  Sclera clear, conjunctiva pink Ears- hearing intact Oropharynx- clear Neck- supple Lungs- Clear to ausculation bilaterally, normal work of breathing Heart- Irregular rate and rhythm, no murmurs, rubs or gallops GI- soft, NT, ND, +  BS Extremities- no clubbing, cyanosis, or edema Skin- no rash or lesion Psych- euthymic mood, full affect Neuro- strength and sensation are intact  Labs    CBC No results for input(s): WBC, NEUTROABS, HGB, HCT, MCV, PLT in the last 72 hours. Basic Metabolic Panel Recent Labs    12/23/18 0239 12/24/18 0407  NA 139 140  K 4.2 3.9  CL 107 106  CO2 19* 22  GLUCOSE 83 89  BUN 18 15  CREATININE 0.99 0.98  CALCIUM 8.9 9.3  MG 1.8 2.0    Potassium  Date/Time Value Ref Range Status  12/24/2018 04:07 AM 3.9 3.5 - 5.1 mmol/L Final   Magnesium  Date/Time Value Ref Range Status  12/24/2018 04:07 AM 2.0 1.7 - 2.4 mg/dL Final    Comment:    Performed at [facility], [place] [address] [place], [place] [address]    Telemetry    NSR from ~1130 yesterday to just after 2300, when she went back into AF. (personally reviewed)  Radiology    No results found.   Patient Profile     Anna Marquez is a 68 y.o. female with a past medical history significant for persistent atrial fibrillation.  They were admitted for tikosyn load.   Assessment & Plan  1. Persistent atrial fibrillation Pt has gone back into AF on Tikosyn 500 mcg BID Unfortunately, her QTc has prolonged, and will need to decrease dose to 250 mcg BID and continue to observe through at least tomorrow.  Continue Eliquis Mg 2.0. K 3.9. Will supp gently.  CHA2DS2VASC is 5  We will potentially plan for DCCV tomorrow as she has gone back into AF and we have had to decrease her dose. Continue to follow. Will need to further assess QTc after DCCV, as had prolonged on 500 mcg dose. (now decreased)  For questions or updates, please contact [place] Please consult [email or web address] for contact info under Cardiology/STEMI.  Signed, Shirley Friar, PA-C  12/24/2018, 8:44 AM    I have seen, examined the patient, and reviewed the above assessment and plan.  Changes to above are made where necessary.  On exam,  iRRR.  tikosyn reduced due to qt prolongation.  Unfortunately, she is back in afib. Continue tikosyn.  Follow qt.  Anticipate cardioversion tomorrow am if still in AF.  Co Sign: Thompson Grayer, MD

## 2018-12-24 NOTE — Progress Notes (Signed)
Pharmacy: Dofetilide (Tikosyn) - Follow Up Assessment and Electrolyte Replacement  Pharmacy consulted to assist in monitoring and replacing electrolytes in this 68 y.o. female admitted on 12/21/2018 undergoing dofetilide initiation.   Labs:    Component Value Date/Time   K 3.9 12/24/2018 0407   MG 2.0 12/24/2018 0407     Plan: Potassium: -Kdur 55meq per ordered per MD  Magnesium: Mg > 2: No additional supplementation needed   Thank you for allowing pharmacy to participate in this patient's care   Hildred Laser, PharmD Clinical Pharmacist **Pharmacist phone directory can now be found on [email or web address] ([email or web address]).  Listed under [place].

## 2018-12-24 NOTE — Progress Notes (Signed)
Patient called with request for water and juice.  RN into room to deliver and patient stated she was feeling short of breath and that occurs when she is in atrial fibrillation.  Per cardiac monitor in room patient appears to be back in atrial fibrillation.  Oxygen at 2L nasal cannula applied for patient comfort.  RN advised patient to hold off on eating and drinking until Doctor rounds this morning to determine the days plan.  Patient agreeable.  In reviewing cardiac continuous monitoring it appears patient converted back to atrial fibrillation a little after 2300.

## 2018-12-25 ENCOUNTER — Encounter (HOSPITAL_COMMUNITY): Payer: Self-pay | Admitting: Internal Medicine

## 2018-12-25 LAB — BASIC METABOLIC PANEL
Anion gap: 11 (ref 5–15)
BUN: 14 mg/dL (ref 8–23)
CO2: 22 mmol/L (ref 22–32)
Calcium: 10 mg/dL (ref 8.9–10.3)
Chloride: 107 mmol/L (ref 98–111)
Creatinine, Ser: 1.08 mg/dL — ABNORMAL HIGH (ref 0.44–1.00)
GFR calc Af Amer: >60 mL/min (ref >60)
GFR calc non Af Amer: 52 mL/min — ABNORMAL LOW (ref >60)
Glucose, Bld: 95 mg/dL (ref 70–99)
Potassium: 4.4 mmol/L (ref 3.5–5.1)
Sodium: 140 mmol/L (ref 135–145)

## 2018-12-25 LAB — MAGNESIUM: Magnesium: 2 mg/dL (ref 1.7–2.4)

## 2018-12-25 LAB — PROTIME-INR
INR: 1.1 (ref 0.8–1.2)
Prothrombin Time: 14.3 seconds (ref 11.4–15.2)

## 2018-12-25 SURGERY — CARDIOVERSION
Anesthesia: General

## 2018-12-25 MED ORDER — DOFETILIDE 250 MCG PO CAPS: 250.0000 ug | ORAL_CAPSULE | Freq: Two times a day (BID) | ORAL | 3 refills | Status: DC

## 2018-12-25 MED ORDER — MAGNESIUM 250 MG PO TABS: 250.0000 mg | ORAL_TABLET | Freq: Every day | ORAL | 0 refills | Status: DC

## 2018-12-25 MED ORDER — DOFETILIDE 250 MCG PO CAPS: 250.0000 ug | ORAL_CAPSULE | Freq: Two times a day (BID) | ORAL | 0 refills | Status: DC

## 2018-12-25 MED FILL — DOFETILIDE 250 MCG CAPS: 250 | 30 days supply | Qty: 60 | Fill #0

## 2018-12-25 NOTE — Progress Notes (Signed)
Pt converted to NSR. [place], PA aware.

## 2018-12-25 NOTE — Discharge Summary (Addendum)
ELECTROPHYSIOLOGY PROCEDURE DISCHARGE SUMMARY    Patient ID: Anna Marquez,  MRN: [id number], DOB/AGE: 69-Jun-1952 69 y.o.  Admit date: 12/21/2018 Discharge date: 12/25/2018  Primary Care Physician: [name], DO  Primary Cardiologist: Mertie Moores, MD  Electrophysiologist: Dr. Rayann Heman  Primary Discharge Diagnosis:  1.  Persistent atrial fibrillation status post Tikosyn loading this admission  Secondary Discharge Diagnosis: 2. NSVT  Allergies  Allergen Reactions  . Erythromycin Nausea And Vomiting and Other (See Comments)    REACTION: Stomach upset  . Penicillins Hives and Shortness Of Breath    Has patient had a PCN reaction causing immediate rash, facial/tongue/throat swelling, SOB or lightheadedness with hypotension: yes Has patient had a PCN reaction causing severe rash involving mucus membranes or skin necrosis: no Has patient had a PCN reaction that required hospitalization: unknown Has patient had a PCN reaction occurring within the last 10 years: no If all of the above answers are "NO", then may proceed with Cephalosporin use.   . Ibuprofen Other (See Comments)    Pt states allergy to NSAIDS is secondary to ulcers NOT anaphylaxis. Tolerates daily 325 ASA at home     Procedures This Admission:  1.  Tikosyn loading  Brief HPI: Anna Marquez is a 68 y.o. female with a past medical history as noted above.  They were referred to EP in the outpatient setting for treatment options of atrial fibrillation.  Risks, benefits, and alternatives to Tikosyn were reviewed with the patient who wished to proceed.    Hospital Course:  The patient was admitted and Tikosyn was initiated.  Renal function and electrolytes were followed during the hospitalization. Pt monitored on tele. Initially, she converted to NSR on tikosyn 500 mcg BID. Unfortunately she went back into atrial fibrillation overnight, and also had QTc prolongation requiring down-titration of drug. We had  planned for DCCV today, 12/25/18, but patient again converted into NSR on drug alone. Follow up EKG in sinus had stable QTc ~ 460-470 ms. She did have NSVT shortly after conversion to NSR. Reviewed with Dr. Rayann Heman, and thought due to stable QTc stable for discharge with close follow up in the AF clinic as below. Pt knows if she has increasing palpitations or lightheadedness to call us, and that if she has syncope to call 911 and to not drive. Follow-up has been arranged with the Atrial Fibrillation clinic in approximately 1 week and with Dr. Rayann Heman  in 4 weeks.   A 30 day supply of her 278mcg prescription was filled through [facility]. With her chronic prescription going to her normal pharmacy, [facility].  Pt states she was taking 250 mg of OTC magnesium daily, so advised to continue this on discharge.   Physical Exam: Vitals:   12/24/18 2034 12/25/18 0458 12/25/18 0503 12/25/18 0830  BP: 124/86  114/87 (!) 125/100  Pulse: (!) 103  99   Resp: 20  18   Temp: 98 F (36.7 C)  97.8 F (36.6 C)   TempSrc: Oral  Oral   SpO2: 95%  97%   Weight:  76.1 kg    Height:        GEN- The patient is well appearing, alert and oriented x 3 today.   HEENT: normocephalic, atraumatic; sclera clear, conjunctiva pink; hearing intact; oropharynx clear; neck supple, no JVP Lymph- no cervical lymphadenopathy Lungs- Clear to ausculation bilaterally, normal work of breathing.  No wheezes, rales, rhonchi Heart- Regular rate and rhythm, no murmurs, rubs or gallops, PMI not laterally  displaced GI- soft, non-tender, non-distended, bowel sounds present, no hepatosplenomegaly Extremities- no clubbing, cyanosis, or edema; DP/PT/radial pulses 2+ bilaterally MS- no significant deformity or atrophy Skin- warm and dry, no rash or lesion Psych- euthymic mood, full affect Neuro- strength and sensation are intact   Labs:   Lab Results  Component Value Date   WBC 6.1 11/05/2018   HGB 12.5 11/05/2018   HCT 35.7 (L)  11/05/2018   MCV 88.1 11/05/2018   PLT 159 11/05/2018    Recent Labs  Lab 12/25/18 1106  NA 140  K 4.4  CL 107  CO2 22  BUN 14  CREATININE 1.08*  CALCIUM 10.0  GLUCOSE 95     Discharge Medications:  Allergies as of 12/25/2018      Reactions   Erythromycin Nausea And Vomiting, Other (See Comments)   REACTION: Stomach upset   Penicillins Hives, Shortness Of Breath   Has patient had a PCN reaction causing immediate rash, facial/tongue/throat swelling, SOB or lightheadedness with hypotension: yes Has patient had a PCN reaction causing severe rash involving mucus membranes or skin necrosis: no Has patient had a PCN reaction that required hospitalization: unknown Has patient had a PCN reaction occurring within the last 10 years: no If all of the above answers are "NO", then may proceed with Cephalosporin use.   Ibuprofen Other (See Comments)   Pt states allergy to NSAIDS is secondary to ulcers NOT anaphylaxis. Tolerates daily 325 ASA at home      Medication List    TAKE these medications   acetaminophen 500 MG tablet Commonly known as: TYLENOL Take 1,000 mg by mouth every 6 (six) hours as needed for moderate pain or headache.   albuterol 108 (90 Base) MCG/ACT inhaler Commonly known as: VENTOLIN HFA Inhale 1-2 puffs into the lungs every 6 (six) hours as needed for wheezing or shortness of breath. What changed: how much to take   diltiazem 30 MG tablet Commonly known as: Cardizem Take 1 Tablet Every 4 Hours As Needed For HR <100 What changed:   how much to take  how to take this  when to take this  reasons to take this  additional instructions   dofetilide 250 MCG capsule Commonly known as: TIKOSYN Take 1 capsule (250 mcg total) by mouth 2 (two) times daily.   dofetilide 250 MCG capsule Commonly known as: Tikosyn Take 1 capsule (250 mcg total) by mouth 2 (two) times daily. Start taking on: January 22, 2019   Eliquis 5 MG Tabs tablet Generic drug:  apixaban TAKE 1 TABLET BY MOUTH 2 TIMES DAILY. What changed: how much to take   ferrous sulfate 325 (65 FE) MG tablet Take 325 mg by mouth every other day.   fluticasone 50 MCG/ACT nasal spray Commonly known as: FLONASE Place 1 spray into both nostrils as needed for allergies or rhinitis. What changed: when to take this   lisinopril 10 MG tablet Commonly known as: ZESTRIL Take 1 tablet (10 mg total) by mouth daily.   loratadine 10 MG tablet Commonly known as: CLARITIN Take 10 mg by mouth daily.   Magnesium 250 MG Tabs Take 1 tablet (250 mg total) by mouth daily.   montelukast 10 MG tablet Commonly known as: SINGULAIR Take 1 tablet (10 mg total) by mouth at bedtime.   nadolol 40 MG tablet Commonly known as: CORGARD Take 1.5 tablets (60 mg total) by mouth 2 (two) times daily.   pantoprazole 40 MG tablet Commonly known as: PROTONIX Take 1 tablet (  40 mg total) by mouth daily.   potassium chloride 10 MEQ tablet Commonly known as: KLOR-CON Take 1 tablet (10 mEq total) by mouth daily as needed (take with HCTZ). What changed:   how much to take  when to take this  reasons to take this   PROBIOTIC PO Take 1 tablet by mouth daily.   vitamin C 1000 MG tablet Take 1,000 mg by mouth daily.   Vitamin D 50 MCG (2000 UT) tablet Take 2,000 Units by mouth daily.   VITAMIN E PO Take 1 capsule by mouth daily.       Disposition:   Follow-up Information    Thompson Grayer, MD Follow up on 02/01/2019.   Specialty: Cardiology Why: at 1045 for a virtual visit, 1 month post tikosyn start Contact information: [place] [address] [phone]        [place] Follow up on 01/04/2019.   Specialty: Cardiology Why: at 1000 am for post hospital tikosyn follow up Contact information: [address] [id number] [place] [id number] [phone]          Duration of Discharge Encounter:  Greater than 30 minutes including physician time.  Jacalyn Lefevre, PA-C  12/25/2018 1:15 PM   Pt discussed at length with Mr Chalmers Cater.  QT is stable.  Will DC to home with close follow-up in the AF clinic  Thompson Grayer MD

## 2018-12-25 NOTE — Care Management Important Message (Signed)
Important Message  Patient Details  Name: Anna Marquez MRN: [id number] Date of Birth: 29-Oct-1950   Medicare Important Message Given:  Yes     Shelda Altes 12/25/2018, 12:38 PM

## 2018-12-25 NOTE — Progress Notes (Signed)
   Tikosyn pricing Pt on decreased dose, which has increased pricing  She can get a 30 day supply of the 250 mcg BID filled at the [facility] outpatient pharmacy (or TOC) for $102.02 through her insurance.   Her usual pharmacy "[facility]" has the 125 mcg in stock, but would order the 250 mcg for her, if she stays on that dose.   Local [facility] (next lowest price) have neither of the lower doses in stock.   In the event she goes home tomorrow on decrease dose, [facility] is only open from 0900 - 1500.     Legrand Como [address]" [place], PA-C  12/25/2018 9:13 AM

## 2018-12-25 NOTE — Progress Notes (Signed)
Pharmacy: Dofetilide (Tikosyn) - Follow Up Assessment and Electrolyte Replacement  Pharmacy consulted to assist in monitoring and replacing electrolytes in this 68 y.o. female admitted on 12/21/2018 undergoing dofetilide initiation.   Labs:    Component Value Date/Time   K 4.4 12/25/2018 1106   MG 2.0 12/25/2018 1106     Plan: Potassium: K >/= 4: No additional supplementation needed  Magnesium: Mg > 2: No additional supplementation needed   Thank you for allowing pharmacy to participate in this patient's care   Hildred Laser, PharmD Clinical Pharmacist **Pharmacist phone directory can now be found on [email or web address] ([email or web address]).  Listed under [place].

## 2018-12-25 NOTE — Progress Notes (Signed)
12/24/2018 evening EKG reviewed  Shows pt remains in afib at 88 bpm with stable QTc at ~480 ms.  Continue Tikosyn 250 mcg BID.   Plan for DCCV this afternoon, and disposition pending her QTc in NSR.  Full note pending disposition.   Shirley Friar, PA-C  Pager: [phone]  12/25/2018 10:43 AM

## 2018-12-29 ENCOUNTER — Ambulatory Visit: Payer: PPO | Attending: Internal Medicine

## 2018-12-29 DIAGNOSIS — R238 Other skin changes: Secondary | ICD-10-CM

## 2018-12-29 DIAGNOSIS — U071 COVID-19: Secondary | ICD-10-CM | POA: Diagnosis not present

## 2018-12-31 LAB — NOVEL CORONAVIRUS, NAA: SARS-CoV-2, NAA: NOT DETECTED

## 2019-01-04 ENCOUNTER — Other Ambulatory Visit: Payer: Self-pay

## 2019-01-04 ENCOUNTER — Ambulatory Visit (HOSPITAL_COMMUNITY)
Admit: 2019-01-04 | Discharge: 2019-01-04 | Disposition: A | Discharge status: Home / Self Care | Payer: PPO | Source: Ambulatory Visit | Attending: Nurse Practitioner | Admitting: Nurse Practitioner

## 2019-01-04 ENCOUNTER — Encounter (HOSPITAL_COMMUNITY): Payer: Self-pay | Admitting: Nurse Practitioner

## 2019-01-04 VITALS — BP 116/88 | HR 112 | Ht 64.0 in | Wt 168.8 lb

## 2019-01-04 DIAGNOSIS — Z8249 Family history of ischemic heart disease and other diseases of the circulatory system: Secondary | ICD-10-CM | POA: Insufficient documentation

## 2019-01-04 DIAGNOSIS — Z7901 Long term (current) use of anticoagulants: Secondary | ICD-10-CM | POA: Diagnosis not present

## 2019-01-04 DIAGNOSIS — Z87442 Personal history of urinary calculi: Secondary | ICD-10-CM | POA: Diagnosis not present

## 2019-01-04 DIAGNOSIS — Z886 Allergy status to analgesic agent status: Secondary | ICD-10-CM | POA: Diagnosis not present

## 2019-01-04 DIAGNOSIS — Z79899 Other long term (current) drug therapy: Secondary | ICD-10-CM | POA: Insufficient documentation

## 2019-01-04 DIAGNOSIS — K219 Gastro-esophageal reflux disease without esophagitis: Secondary | ICD-10-CM | POA: Diagnosis not present

## 2019-01-04 DIAGNOSIS — Z880 Allergy status to penicillin: Secondary | ICD-10-CM | POA: Insufficient documentation

## 2019-01-04 DIAGNOSIS — M1990 Unspecified osteoarthritis, unspecified site: Secondary | ICD-10-CM | POA: Insufficient documentation

## 2019-01-04 DIAGNOSIS — I10 Essential (primary) hypertension: Secondary | ICD-10-CM | POA: Insufficient documentation

## 2019-01-04 DIAGNOSIS — I4891 Unspecified atrial fibrillation: Secondary | ICD-10-CM | POA: Diagnosis present

## 2019-01-04 DIAGNOSIS — D6869 Other thrombophilia: Secondary | ICD-10-CM

## 2019-01-04 DIAGNOSIS — Z881 Allergy status to other antibiotic agents status: Secondary | ICD-10-CM | POA: Insufficient documentation

## 2019-01-04 DIAGNOSIS — I4819 Other persistent atrial fibrillation: Secondary | ICD-10-CM | POA: Diagnosis not present

## 2019-01-04 LAB — BASIC METABOLIC PANEL
Anion gap: 10 (ref 5–15)
BUN: 20 mg/dL (ref 8–23)
CO2: 23 mmol/L (ref 22–32)
Calcium: 10 mg/dL (ref 8.9–10.3)
Chloride: 108 mmol/L (ref 98–111)
Creatinine, Ser: 1.14 mg/dL — ABNORMAL HIGH (ref 0.44–1.00)
GFR calc Af Amer: 57 mL/min — ABNORMAL LOW (ref >60)
GFR calc non Af Amer: 49 mL/min — ABNORMAL LOW (ref >60)
Glucose, Bld: 96 mg/dL (ref 70–99)
Potassium: 4.6 mmol/L (ref 3.5–5.1)
Sodium: 141 mmol/L (ref 135–145)

## 2019-01-04 LAB — CBC
HCT: 42.2 % (ref 36.0–46.0)
Hemoglobin: 14.2 g/dL (ref 12.0–15.0)
MCH: 30.7 pg (ref 26.0–34.0)
MCHC: 33.6 g/dL (ref 30.0–36.0)
MCV: 91.3 fL (ref 80.0–100.0)
Platelets: 211 10*3/uL (ref 150–400)
RBC: 4.62 MIL/uL (ref 3.87–5.11)
RDW: 12.3 % (ref 11.5–15.5)
WBC: 6.4 10*3/uL (ref 4.0–10.5)
nRBC: 0 % (ref 0.0–0.2)

## 2019-01-04 LAB — MAGNESIUM: Magnesium: 2 mg/dL (ref 1.7–2.4)

## 2019-01-04 MED ORDER — NADOLOL 40 MG PO TABS: 60.0000 mg | ORAL_TABLET | Freq: Two times a day (BID) | ORAL | 3 refills | Status: DC

## 2019-01-04 NOTE — Progress Notes (Signed)
Primary Care Physician: [name], DO Referring Physician: ER f/u Cardiologist: Dr. [name] is a 68 y.o. female with a h/o paroxysmal afib first dx March 2018. She was originally  seen  in the afib office for having afib with RVR  in the ER,  and receiving successful cardioversion 11/05/18. Unfortunately, she was back in rapid afib at 141 bpm.   She had an echo and a ETT in 2018. EKG in rhythm in 2018 showed SR with a first degree av block.  In the past she would take an extra corgard to get back in rhythm. No outstanding triggers.   Return to afib clinic 12/16/18.  Repeat Echo showed reduced EF at 45-50% and Zio patch showed continuous afib with  HR control  at 96 bpm, max at 218 ms, minimum at 54 bpm.  Discussed antiarrythmic's with pt. With the reduced EF and evidence of first degree AV block on a prior EKG in rhythm, it would be by guidelines to avoid Multaq  and flecainide. Discussed with pt use of  tikosyn and she wants to pursue.   Back in afib, clinic for Tikosyn admit, 12/21/18. She states that her afib has been running over 100 bpm for the last   several days. It is making her more fatigued.  In the clinic v rates in the 120's. She  is getting more symptomatic with it. Her weight is stable.   F/u in afib clinic, 01/04/19.  She is in afib this am but has felt so much better and was surprised  that she was out of rhythm. She went in and out while in the hospital  but did break with Tikosyn. Did not require DCCV. The dose had to be reduced to 250 mcg  because the qtc did prolong. Continues  on eliquis with a CHA2DS2VASc score of 3.  Today, she denies symptoms of palpitations, chest pain, shortness of breath, orthopnea, PND, lower extremity edema, dizziness, presyncope, syncope, or neurologic sequela. The patient is tolerating medications without difficulties and is otherwise without complaint today.   Past Medical History:  Diagnosis Date  . Adenomatous colon polyp  2003  . Cholelithiasis   . Diverticulosis of colon   . Gastric polyps   . GERD (gastroesophageal reflux disease)   . Helicobacter pylori (H. pylori)   . Hiatal hernia   . Hypertension   . Mitral valve prolapse   . Nephrolithiasis   . Osteoarthritis   . Osteopenia 03/2017   T score -1.5 FRAX 8.9% / 0.9%.  Stable from prior DEXA 2016  . TGA (transient global amnesia)    Past Surgical History:  Procedure Laterality Date  . ANTERIOR AND POSTERIOR REPAIR N/A 06/14/2014   Procedure: ANTERIOR (CYSTOCELE) AND POSTERIOR REPAIR (RECTOCELE);  Surgeon: Anastasio Auerbach, MD;  Location: [facility];  Service: Gynecology;  Laterality: N/A;  . CHOLECYSTECTOMY    . kidney stone removal     x2  . VAGINAL HYSTERECTOMY N/A 06/14/2014   Procedure: HYSTERECTOMY VAGINAL;  Surgeon: Anastasio Auerbach, MD;  Location: [facility];  Service: Gynecology;  Laterality: N/A;    Current Outpatient Medications  Medication Sig Dispense Refill  . acetaminophen (TYLENOL) 500 MG tablet Take 1,000 mg by mouth every 6 (six) hours as needed for moderate pain or headache.    . albuterol (VENTOLIN HFA) 108 (90 Base) MCG/ACT inhaler Inhale 1-2 puffs into the lungs every 6 (six) hours as needed for wheezing or shortness of breath. (Patient  taking differently: Inhale 2 puffs into the lungs every 6 (six) hours as needed for wheezing or shortness of breath. ) 18 g 3  . Ascorbic Acid (VITAMIN C) 1000 MG tablet Take 1,000 mg by mouth daily.    . Cholecalciferol (VITAMIN D) 50 MCG (2000 UT) tablet Take 2,000 Units by mouth daily.    [name] diltiazem (CARDIZEM) 30 MG tablet Take 1 Tablet Every 4 Hours As Needed For HR <100 (Patient taking differently: Take 30 mg by mouth every 4 (four) hours as needed (HR >100). ) 45 tablet 1  . [START ON 01/22/2019] dofetilide (TIKOSYN) 250 MCG capsule Take 1 capsule (250 mcg total) by mouth 2 (two) times daily. 180 capsule 3  . ELIQUIS 5 MG TABS tablet TAKE 1 TABLET BY MOUTH 2 TIMES DAILY. (Patient taking  differently: Take 5 mg by mouth 2 (two) times daily. ) 60 tablet 6  . ferrous sulfate 325 (65 FE) MG tablet Take 325 mg by mouth every other day.    . fluticasone (FLONASE) 50 MCG/ACT nasal spray Place 1 spray into both nostrils as needed for allergies or rhinitis. (Patient taking differently: Place 1 spray into both nostrils daily. ) 16 g 6  . lisinopril (ZESTRIL) 10 MG tablet Take 1 tablet (10 mg total) by mouth daily. 100 tablet 3  . loratadine (CLARITIN) 10 MG tablet Take 10 mg by mouth daily.     . Magnesium 250 MG TABS Take 1 tablet (250 mg total) by mouth daily.  0  . montelukast (SINGULAIR) 10 MG tablet Take 1 tablet (10 mg total) by mouth at bedtime. 30 tablet 3  . nadolol (CORGARD) 40 MG tablet Take 1.5 tablets (60 mg total) by mouth 2 (two) times daily. 270 tablet 3  . pantoprazole (PROTONIX) 40 MG tablet Take 1 tablet (40 mg total) by mouth daily. 90 tablet 4  . potassium chloride (K-DUR) 10 MEQ tablet Take 1 tablet (10 mEq total) by mouth daily as needed (take with HCTZ). (Patient taking differently: Take 10-40 mEq by mouth once as needed (when directed to take by MD). ) 30 tablet 6  . Probiotic Product (PROBIOTIC PO) Take 1 tablet by mouth daily.    [name] VITAMIN E PO Take 1 capsule by mouth daily.      No current facility-administered medications for this encounter.    Allergies  Allergen Reactions  . Erythromycin Nausea And Vomiting and Other (See Comments)    REACTION: Stomach upset  . Penicillins Hives and Shortness Of Breath    Has patient had a PCN reaction causing immediate rash, facial/tongue/throat swelling, SOB or lightheadedness with hypotension: yes Has patient had a PCN reaction causing severe rash involving mucus membranes or skin necrosis: no Has patient had a PCN reaction that required hospitalization: unknown Has patient had a PCN reaction occurring within the last 10 years: no If all of the above answers are "NO", then may proceed with Cephalosporin use.   .  Ibuprofen Other (See Comments)    Pt states allergy to NSAIDS is secondary to ulcers NOT anaphylaxis. Tolerates daily 325 ASA at home    Social History   Socioeconomic History  . Marital status: Divorced    Spouse name: Not on file  . Number of children: Not on file  . Years of education: Not on file  . Highest education level: Not on file  Occupational History  . Not on file  Tobacco Use  . Smoking status: Never Smoker  . Smokeless tobacco: Never  Used  Substance and Sexual Activity  . Alcohol use: Yes    Alcohol/week: 0.0 standard drinks    Comment: Occas  . Drug use: No  . Sexual activity: Yes    Birth control/protection: Post-menopausal    Comment: 1st intercourse 68 yo-Fewer than 5 partners  Other Topics Concern  . Not on file  Social History Narrative  . Not on file   Social Determinants of Health   Financial Resource Strain:   . Difficulty of Paying Living Expenses: Not on file  Food Insecurity:   . Worried About Charity fundraiser in the Last Year: Not on file  . Ran Out of Food in the Last Year: Not on file  Transportation Needs:   . Lack of Transportation (Medical): Not on file  . Lack of Transportation (Non-Medical): Not on file  Physical Activity:   . Days of Exercise per Week: Not on file  . Minutes of Exercise per Session: Not on file  Stress:   . Feeling of Stress : Not on file  Social Connections:   . Frequency of Communication with Friends and Family: Not on file  . Frequency of Social Gatherings with Friends and Family: Not on file  . Attends Religious Services: Not on file  . Active Member of Clubs or Organizations: Not on file  . Attends Archivist Meetings: Not on file  . Marital Status: Not on file  Intimate Partner Violence:   . Fear of Current or Ex-Partner: Not on file  . Emotionally Abused: Not on file  . Physically Abused: Not on file  . Sexually Abused: Not on file    Family History  Problem Relation Age of Onset  .  Cancer Father        prostate  . Breast cancer Paternal Aunt 65  . Diabetes Maternal Grandmother   . Heart disease Maternal Grandmother   . Heart disease Maternal Grandfather   . Melanoma Paternal Aunt   . Neuropathy Mother   . Congestive Heart Failure Mother     ROS- All systems are reviewed and negative except as per the HPI above  Physical Exam: Vitals:   01/04/19 1004  BP: 116/88  Pulse: (!) 112  Weight: 76.6 kg  Height: 5\' 4"  (1.626 m)   Wt Readings from Last 3 Encounters:  01/04/19 76.6 kg  12/25/18 76.1 kg  12/21/18 78.4 kg    Labs: Lab Results  Component Value Date   NA 141 01/04/2019   K 4.6 01/04/2019   CL 108 01/04/2019   CO2 23 01/04/2019   GLUCOSE 96 01/04/2019   BUN 20 01/04/2019   CREATININE 1.14 (H) 01/04/2019   CALCIUM 10.0 01/04/2019   MG 2.0 01/04/2019   Lab Results  Component Value Date   INR 1.1 12/25/2018   Lab Results  Component Value Date   CHOL 184 11/04/2018   HDL 50.60 11/04/2018   LDLCALC 103 (H) 10/28/2017   TRIG 259.0 (H) 11/04/2018     GEN- The patient is well appearing, alert and oriented x 3 today.   Head- normocephalic, atraumatic Eyes-  Sclera clear, conjunctiva pink Ears- hearing intact Oropharynx- clear Neck- supple, no JVP Lymph- no cervical lymphadenopathy Lungs- Clear to ausculation bilaterally, normal work of breathing Heart- irregular  rate and rhythm, no murmurs, rubs or gallops, PMI not laterally displaced GI- soft, NT, ND, + BS Extremities- no clubbing, cyanosis, or edema MS- no significant deformity or atrophy Skin- no rash or lesion Psych- euthymic mood,  full affect Neuro- strength and sensation are intact  EKG-afib at 112 bpm, qrs 78  bpm, qtc 443 ms  EchoStudy Conclusions-2020 1. Left ventricular ejection fraction, by visual estimation, is 45 to 50%. The left ventricle has normal function. There is no left ventricular hypertrophy. 2. Mild global hypokinesis. 3. Left ventricular diastolic  function could not be evaluated. 4. Global right ventricle has normal systolic function.The right ventricular size is normal. No increase in right ventricular wall thickness. 5. Left atrial size was normal. 6. Right atrial size was normal. 7. The mitral valve is abnormal. Mild to moderate mitral valve regurgitation. 8. The tricuspid valve is grossly normal. Tricuspid valve regurgitation is trivial. 9. The aortic valve is tricuspid. Aortic valve regurgitation is not visualized. 10. The pulmonic valve was grossly normal. Pulmonic valve regurgitation is not visualized. 11. Normal pulmonary artery systolic pressure. 12. The inferior vena cava is normal in size with greater than 50% respiratory variability, suggesting right atrial pressure of 3 mmHg. Left Ventricle: Left ventricular ejection fraction, by visual estimation, is 45 to 50%. The left ventricle    Assessment and Plan: 1. Persistent  afib  Now s/p tikosyn loading and is on 250 mcg  Dofetilide with stable qtc Unfortunately, she is found to be in afib this am Feels much improved  No changes in approach, may just be in afib during visit Bmet/mag/cbc today   2. CHA2DS2VASc score of at least 3 Continue eliquis 5 mg bid   F/u in one week   Butch Penny C. Boby Eyer, [facility] [address] [place],  [address] [phone]

## 2019-01-11 ENCOUNTER — Other Ambulatory Visit (HOSPITAL_COMMUNITY): Payer: Self-pay | Admitting: *Deleted

## 2019-01-11 ENCOUNTER — Inpatient Hospital Stay (HOSPITAL_COMMUNITY): Admission: RE | Admit: 2019-01-11 | Payer: PPO | Source: Ambulatory Visit

## 2019-01-11 ENCOUNTER — Encounter (HOSPITAL_COMMUNITY): Payer: Self-pay | Admitting: Nurse Practitioner

## 2019-01-11 ENCOUNTER — Ambulatory Visit (HOSPITAL_COMMUNITY)
Admission: RE | Admit: 2019-01-11 | Discharge: 2019-01-11 | Disposition: A | Discharge status: Home / Self Care | Payer: PPO | Source: Ambulatory Visit | Attending: Nurse Practitioner | Admitting: Nurse Practitioner

## 2019-01-11 ENCOUNTER — Other Ambulatory Visit: Payer: Self-pay

## 2019-01-11 VITALS — BP 98/82 | HR 110 | Ht 64.0 in | Wt 172.6 lb

## 2019-01-11 DIAGNOSIS — M8580 Other specified disorders of bone density and structure, unspecified site: Secondary | ICD-10-CM | POA: Diagnosis not present

## 2019-01-11 DIAGNOSIS — I340 Nonrheumatic mitral (valve) insufficiency: Secondary | ICD-10-CM | POA: Diagnosis not present

## 2019-01-11 DIAGNOSIS — I440 Atrioventricular block, first degree: Secondary | ICD-10-CM | POA: Insufficient documentation

## 2019-01-11 DIAGNOSIS — Z79899 Other long term (current) drug therapy: Secondary | ICD-10-CM | POA: Diagnosis not present

## 2019-01-11 DIAGNOSIS — D6869 Other thrombophilia: Secondary | ICD-10-CM | POA: Diagnosis not present

## 2019-01-11 DIAGNOSIS — Z7901 Long term (current) use of anticoagulants: Secondary | ICD-10-CM | POA: Insufficient documentation

## 2019-01-11 DIAGNOSIS — M1990 Unspecified osteoarthritis, unspecified site: Secondary | ICD-10-CM | POA: Insufficient documentation

## 2019-01-11 DIAGNOSIS — I4819 Other persistent atrial fibrillation: Secondary | ICD-10-CM | POA: Insufficient documentation

## 2019-01-11 DIAGNOSIS — K5730 Diverticulosis of large intestine without perforation or abscess without bleeding: Secondary | ICD-10-CM | POA: Insufficient documentation

## 2019-01-11 DIAGNOSIS — I10 Essential (primary) hypertension: Secondary | ICD-10-CM | POA: Insufficient documentation

## 2019-01-11 DIAGNOSIS — K219 Gastro-esophageal reflux disease without esophagitis: Secondary | ICD-10-CM | POA: Diagnosis not present

## 2019-01-11 NOTE — H&P (View-Only) (Signed)
Primary Care Physician: [name], DO Referring Physician: ER f/u Cardiologist: Dr. [name] is a 69 y.o. female with a h/o paroxysmal afib first dx March 2018. She was originally  seen  in the afib office for having afib with RVR  in the ER,  and receiving successful cardioversion 11/05/18. Unfortunately, she was back in rapid afib at 141 bpm.   She had an echo and a ETT in 2018. EKG in rhythm in 2018 showed SR with a first degree av block.  In the past she would take an extra corgard to get back in rhythm. No outstanding triggers.   Return to afib clinic 12/16/18.  Repeat Echo showed reduced EF at 45-50% and Zio patch showed continuous afib with  HR control  at 96 bpm, max at 218 ms, minimum at 54 bpm.  Discussed antiarrythmic's with pt. With the reduced EF and evidence of first degree AV block on a prior EKG in rhythm, it would be by guidelines to avoid Multaq  and flecainide. Discussed with pt use of  tikosyn and she wants to pursue.   Back in afib, clinic for Tikosyn admit, 12/21/18. She states that her afib has been running over 100 bpm for the last   several days. It is making her more fatigued.  In the clinic v rates in the 120's. She  is getting more symptomatic with it. Her weight is stable.   F/u in afib clinic, 01/04/19.  She is in afib this am but has felt so much better and was surprised  that she was out of rhythm. She went in and out while in the hospital  but did break with Tikosyn. Did not require DCCV. The dose had to be reduced to 250 mcg  because the qtc did prolong. Continues  on eliquis with a CHA2DS2VASc score of 3.  F/u in afib clinic, 01/11/19. EKG shows afib. Her records at home indicate that her HR's have been elevated so I suspect that she has been in afib since I last saw her last week. The shortness of breath improved after hospitalization for Tikosyn but now  has  returned.  Review os her TSH over time shows low TSH readings but T4 when rechecked in  November  was in normal range.   Today, she denies symptoms of palpitations, chest pain, shortness of breath, orthopnea, PND, lower extremity edema, dizziness, presyncope, syncope, or neurologic sequela. The patient is tolerating medications without difficulties and is otherwise without complaint today.   Past Medical History:  Diagnosis Date  . Adenomatous colon polyp 2003  . Cholelithiasis   . Diverticulosis of colon   . Gastric polyps   . GERD (gastroesophageal reflux disease)   . Helicobacter pylori (H. pylori)   . Hiatal hernia   . Hypertension   . Mitral valve prolapse   . Nephrolithiasis   . Osteoarthritis   . Osteopenia 03/2017   T score -1.5 FRAX 8.9% / 0.9%.  Stable from prior DEXA 2016  . TGA (transient global amnesia)    Past Surgical History:  Procedure Laterality Date  . ANTERIOR AND POSTERIOR REPAIR N/A 06/14/2014   Procedure: ANTERIOR (CYSTOCELE) AND POSTERIOR REPAIR (RECTOCELE);  Surgeon: Anastasio Auerbach, MD;  Location: [facility] [place] [facility];  Service: Gynecology;  Laterality: N/A;  . CHOLECYSTECTOMY    . kidney stone removal     x2  . VAGINAL HYSTERECTOMY N/A 06/14/2014   Procedure: HYSTERECTOMY VAGINAL;  Surgeon: Anastasio Auerbach, MD;  Location: [facility];  Service: Gynecology;  Laterality: N/A;    Current Outpatient Medications  Medication Sig Dispense Refill  . acetaminophen (TYLENOL) 500 MG tablet Take 1,000 mg by mouth every 6 (six) hours as needed for moderate pain or headache.    . albuterol (VENTOLIN HFA) 108 (90 Base) MCG/ACT inhaler Inhale 1-2 puffs into the lungs every 6 (six) hours as needed for wheezing or shortness of breath. (Patient taking differently: Inhale 2 puffs into the lungs every 6 (six) hours as needed for wheezing or shortness of breath. ) 18 g 3  . Ascorbic Acid (VITAMIN C) 1000 MG tablet Take 1,000 mg by mouth daily.    . Cholecalciferol (VITAMIN D) 50 MCG (2000 UT) tablet Take 2,000 Units by mouth daily.    [name] diltiazem (CARDIZEM) 30 MG tablet Take 1  Tablet Every 4 Hours As Needed For HR <100 (Patient taking differently: Take 30 mg by mouth every 4 (four) hours as needed (HR >100). ) 45 tablet 1  . [START ON 01/22/2019] dofetilide (TIKOSYN) 250 MCG capsule Take 1 capsule (250 mcg total) by mouth 2 (two) times daily. 180 capsule 3  . ELIQUIS 5 MG TABS tablet TAKE 1 TABLET BY MOUTH 2 TIMES DAILY. (Patient taking differently: Take 5 mg by mouth 2 (two) times daily. ) 60 tablet 6  . ferrous sulfate 325 (65 FE) MG tablet Take 325 mg by mouth every other day.    . fluticasone (FLONASE) 50 MCG/ACT nasal spray Place 1 spray into both nostrils as needed for allergies or rhinitis. (Patient taking differently: Place 1 spray into both nostrils daily. ) 16 g 6  . lisinopril (ZESTRIL) 10 MG tablet Take 1 tablet (10 mg total) by mouth daily. 100 tablet 3  . loratadine (CLARITIN) 10 MG tablet Take 10 mg by mouth daily.     . Magnesium 250 MG TABS Take 1 tablet (250 mg total) by mouth daily.  0  . montelukast (SINGULAIR) 10 MG tablet Take 1 tablet (10 mg total) by mouth at bedtime. 30 tablet 3  . nadolol (CORGARD) 40 MG tablet Take 1.5 tablets (60 mg total) by mouth 2 (two) times daily. 270 tablet 3  . pantoprazole (PROTONIX) 40 MG tablet Take 1 tablet (40 mg total) by mouth daily. 90 tablet 4  . Probiotic Product (PROBIOTIC PO) Take 1 tablet by mouth daily.    [name] VITAMIN E PO Take 1 capsule by mouth daily.     . potassium chloride (K-DUR) 10 MEQ tablet Take 1 tablet (10 mEq total) by mouth daily as needed (take with HCTZ). (Patient not taking: Reported on 01/11/2019) 30 tablet 6   No current facility-administered medications for this encounter.    Allergies  Allergen Reactions  . Erythromycin Nausea And Vomiting and Other (See Comments)    REACTION: Stomach upset  . Penicillins Hives and Shortness Of Breath    Has patient had a PCN reaction causing immediate rash, facial/tongue/throat swelling, SOB or lightheadedness with hypotension: yes Has patient had a  PCN reaction causing severe rash involving mucus membranes or skin necrosis: no Has patient had a PCN reaction that required hospitalization: unknown Has patient had a PCN reaction occurring within the last 10 years: no If all of the above answers are "NO", then may proceed with Cephalosporin use.   . Ibuprofen Other (See Comments)    Pt states allergy to NSAIDS is secondary to ulcers NOT anaphylaxis. Tolerates daily 325 ASA at home    Social History  Socioeconomic History  . Marital status: Divorced    Spouse name: Not on file  . Number of children: Not on file  . Years of education: Not on file  . Highest education level: Not on file  Occupational History  . Not on file  Tobacco Use  . Smoking status: Never Smoker  . Smokeless tobacco: Never Used  Substance and Sexual Activity  . Alcohol use: Yes    Alcohol/week: 0.0 standard drinks    Comment: Occas  . Drug use: No  . Sexual activity: Yes    Birth control/protection: Post-menopausal    Comment: 1st intercourse 69 yo-Fewer than 5 partners  Other Topics Concern  . Not on file  Social History Narrative  . Not on file   Social Determinants of Health   Financial Resource Strain:   . Difficulty of Paying Living Expenses: Not on file  Food Insecurity:   . Worried About Charity fundraiser in the Last Year: Not on file  . Ran Out of Food in the Last Year: Not on file  Transportation Needs:   . Lack of Transportation (Medical): Not on file  . Lack of Transportation (Non-Medical): Not on file  Physical Activity:   . Days of Exercise per Week: Not on file  . Minutes of Exercise per Session: Not on file  Stress:   . Feeling of Stress : Not on file  Social Connections:   . Frequency of Communication with Friends and Family: Not on file  . Frequency of Social Gatherings with Friends and Family: Not on file  . Attends Religious Services: Not on file  . Active Member of Clubs or Organizations: Not on file  . Attends Theatre manager Meetings: Not on file  . Marital Status: Not on file  Intimate Partner Violence:   . Fear of Current or Ex-Partner: Not on file  . Emotionally Abused: Not on file  . Physically Abused: Not on file  . Sexually Abused: Not on file    Family History  Problem Relation Age of Onset  . Cancer Father        prostate  . Breast cancer Paternal Aunt 6  . Diabetes Maternal Grandmother   . Heart disease Maternal Grandmother   . Heart disease Maternal Grandfather   . Melanoma Paternal Aunt   . Neuropathy Mother   . Congestive Heart Failure Mother     ROS- All systems are reviewed and negative except as per the HPI above  Physical Exam: Vitals:   01/11/19 1035  BP: 98/82  Pulse: (!) 110  SpO2: 96%  Weight: 78.3 kg  Height: 5\' 4"  (1.626 m)   Wt Readings from Last 3 Encounters:  01/11/19 78.3 kg  01/04/19 76.6 kg  12/25/18 76.1 kg    Labs: Lab Results  Component Value Date   NA 141 01/04/2019   K 4.6 01/04/2019   CL 108 01/04/2019   CO2 23 01/04/2019   GLUCOSE 96 01/04/2019   BUN 20 01/04/2019   CREATININE 1.14 (H) 01/04/2019   CALCIUM 10.0 01/04/2019   MG 2.0 01/04/2019   Lab Results  Component Value Date   INR 1.1 12/25/2018   Lab Results  Component Value Date   CHOL 184 11/04/2018   HDL 50.60 11/04/2018   LDLCALC 103 (H) 10/28/2017   TRIG 259.0 (H) 11/04/2018     GEN- The patient is well appearing, alert and oriented x 3 today.   Head- normocephalic, atraumatic Eyes-  Sclera clear, conjunctiva  pink Ears- hearing intact Oropharynx- clear Neck- supple, no JVP Lymph- no cervical lymphadenopathy Lungs- Clear to ausculation bilaterally, normal work of breathing Heart- irregular  rate and rhythm, no murmurs, rubs or gallops, PMI not laterally displaced GI- soft, NT, ND, + BS Extremities- no clubbing, cyanosis, or edema MS- no significant deformity or atrophy Skin- no rash or lesion Psych- euthymic mood, full affect Neuro- strength and  sensation are intact  EKG-afib at 110  bpm, qrs 78  bpm, qtc 433ms  EchoStudy Conclusions-2020 1. Left ventricular ejection fraction, by visual estimation, is 45 to 50%. The left ventricle has normal function. There is no left ventricular hypertrophy. 2. Mild global hypokinesis. 3. Left ventricular diastolic function could not be evaluated. 4. Global right ventricle has normal systolic function.The right ventricular size is normal. No increase in right ventricular wall thickness. 5. Left atrial size was normal. 6. Right atrial size was normal. 7. The mitral valve is abnormal. Mild to moderate mitral valve regurgitation. 8. The tricuspid valve is grossly normal. Tricuspid valve regurgitation is trivial. 9. The aortic valve is tricuspid. Aortic valve regurgitation is not visualized. 10. The pulmonic valve was grossly normal. Pulmonic valve regurgitation is not visualized. 11. Normal pulmonary artery systolic pressure. 12. The inferior vena cava is normal in size with greater than 50% respiratory variability, suggesting right atrial pressure of 3 mmHg. Left Ventricle: Left ventricular ejection fraction, by visual estimation, is 45 to 50%. The left ventricle    Assessment and Plan: 1. Persistent  afib  Now s/p tikosyn loading and is on 250 mcg  Dofetilide with stable qtc Unfortunately, she is thought  to be back in afib  since right after discharge  Will schedule for DCCV Bmet/mag/cbc recently done and WNL  2. CHA2DS2VASc score of at least 3 Continue eliquis 5 mg bid  States no missed doses x 3 weeks   3. Abnormal TSH in past with normal T4 PCP has been tracking this Per pt no need for further w/u  F/u with in afib clinic in one week  Dr. Rayann Heman 1/28 If cardioversion does not return/maintain  SR, she will need to discuss ablation at that time     Butch Penny C. Bayley Hurn, [facility] [address] [place], [place] [address] [phone]

## 2019-01-11 NOTE — Patient Instructions (Addendum)
Cardioversion scheduled for Friday 01/15/19 Please arrive at 9:00 AM at Entrance A of [facility]  Please DO NOT have anything to eat or drink after midnight You will need someone to drive you home afterwards  Please go for your Covid Test at [address]

## 2019-01-11 NOTE — Progress Notes (Signed)
Primary Care Physician: [name], DO Referring Physician: ER f/u Cardiologist: Dr. [name] is a 69 y.o. female with a h/o paroxysmal afib first dx March 2018. She was originally  seen  in the afib office for having afib with RVR  in the ER,  and receiving successful cardioversion 11/05/18. Unfortunately, she was back in rapid afib at 141 bpm.   She had an echo and a ETT in 2018. EKG in rhythm in 2018 showed SR with a first degree av block.  In the past she would take an extra corgard to get back in rhythm. No outstanding triggers.   Return to afib clinic 12/16/18.  Repeat Echo showed reduced EF at 45-50% and Zio patch showed continuous afib with  HR control  at 96 bpm, max at 218 ms, minimum at 54 bpm.  Discussed antiarrythmic's with pt. With the reduced EF and evidence of first degree AV block on a prior EKG in rhythm, it would be by guidelines to avoid Multaq  and flecainide. Discussed with pt use of  tikosyn and she wants to pursue.   Back in afib, clinic for Tikosyn admit, 12/21/18. She states that her afib has been running over 100 bpm for the last   several days. It is making her more fatigued.  In the clinic v rates in the 120's. She  is getting more symptomatic with it. Her weight is stable.   F/u in afib clinic, 01/04/19.  She is in afib this am but has felt so much better and was surprised  that she was out of rhythm. She went in and out while in the hospital  but did break with Tikosyn. Did not require DCCV. The dose had to be reduced to 250 mcg  because the qtc did prolong. Continues  on eliquis with a CHA2DS2VASc score of 3.  F/u in afib clinic, 01/11/19. EKG shows afib. Her records at home indicate that her HR's have been elevated so I suspect that she has been in afib since I last saw her last week. The shortness of breath improved after hospitalization for Tikosyn but now  has  returned.  Review os her TSH over time shows low TSH readings but T4 when rechecked in  November  was in normal range.   Today, she denies symptoms of palpitations, chest pain, shortness of breath, orthopnea, PND, lower extremity edema, dizziness, presyncope, syncope, or neurologic sequela. The patient is tolerating medications without difficulties and is otherwise without complaint today.   Past Medical History:  Diagnosis Date  . Adenomatous colon polyp 2003  . Cholelithiasis   . Diverticulosis of colon   . Gastric polyps   . GERD (gastroesophageal reflux disease)   . Helicobacter pylori (H. pylori)   . Hiatal hernia   . Hypertension   . Mitral valve prolapse   . Nephrolithiasis   . Osteoarthritis   . Osteopenia 03/2017   T score -1.5 FRAX 8.9% / 0.9%.  Stable from prior DEXA 2016  . TGA (transient global amnesia)    Past Surgical History:  Procedure Laterality Date  . ANTERIOR AND POSTERIOR REPAIR N/A 06/14/2014   Procedure: ANTERIOR (CYSTOCELE) AND POSTERIOR REPAIR (RECTOCELE);  Surgeon: Anastasio Auerbach, MD;  Location: [facility];  Service: Gynecology;  Laterality: N/A;  . CHOLECYSTECTOMY    . kidney stone removal     x2  . VAGINAL HYSTERECTOMY N/A 06/14/2014   Procedure: HYSTERECTOMY VAGINAL;  Surgeon: Anastasio Auerbach, MD;  Location: [facility];  Service: Gynecology;  Laterality: N/A;    Current Outpatient Medications  Medication Sig Dispense Refill  . acetaminophen (TYLENOL) 500 MG tablet Take 1,000 mg by mouth every 6 (six) hours as needed for moderate pain or headache.    . albuterol (VENTOLIN HFA) 108 (90 Base) MCG/ACT inhaler Inhale 1-2 puffs into the lungs every 6 (six) hours as needed for wheezing or shortness of breath. (Patient taking differently: Inhale 2 puffs into the lungs every 6 (six) hours as needed for wheezing or shortness of breath. ) 18 g 3  . Ascorbic Acid (VITAMIN C) 1000 MG tablet Take 1,000 mg by mouth daily.    . Cholecalciferol (VITAMIN D) 50 MCG (2000 UT) tablet Take 2,000 Units by mouth daily.    [name] diltiazem (CARDIZEM) 30 MG tablet Take 1  Tablet Every 4 Hours As Needed For HR <100 (Patient taking differently: Take 30 mg by mouth every 4 (four) hours as needed (HR >100). ) 45 tablet 1  . [START ON 01/22/2019] dofetilide (TIKOSYN) 250 MCG capsule Take 1 capsule (250 mcg total) by mouth 2 (two) times daily. 180 capsule 3  . ELIQUIS 5 MG TABS tablet TAKE 1 TABLET BY MOUTH 2 TIMES DAILY. (Patient taking differently: Take 5 mg by mouth 2 (two) times daily. ) 60 tablet 6  . ferrous sulfate 325 (65 FE) MG tablet Take 325 mg by mouth every other day.    . fluticasone (FLONASE) 50 MCG/ACT nasal spray Place 1 spray into both nostrils as needed for allergies or rhinitis. (Patient taking differently: Place 1 spray into both nostrils daily. ) 16 g 6  . lisinopril (ZESTRIL) 10 MG tablet Take 1 tablet (10 mg total) by mouth daily. 100 tablet 3  . loratadine (CLARITIN) 10 MG tablet Take 10 mg by mouth daily.     . Magnesium 250 MG TABS Take 1 tablet (250 mg total) by mouth daily.  0  . montelukast (SINGULAIR) 10 MG tablet Take 1 tablet (10 mg total) by mouth at bedtime. 30 tablet 3  . nadolol (CORGARD) 40 MG tablet Take 1.5 tablets (60 mg total) by mouth 2 (two) times daily. 270 tablet 3  . pantoprazole (PROTONIX) 40 MG tablet Take 1 tablet (40 mg total) by mouth daily. 90 tablet 4  . Probiotic Product (PROBIOTIC PO) Take 1 tablet by mouth daily.    [name] VITAMIN E PO Take 1 capsule by mouth daily.     . potassium chloride (K-DUR) 10 MEQ tablet Take 1 tablet (10 mEq total) by mouth daily as needed (take with HCTZ). (Patient not taking: Reported on 01/11/2019) 30 tablet 6   No current facility-administered medications for this encounter.    Allergies  Allergen Reactions  . Erythromycin Nausea And Vomiting and Other (See Comments)    REACTION: Stomach upset  . Penicillins Hives and Shortness Of Breath    Has patient had a PCN reaction causing immediate rash, facial/tongue/throat swelling, SOB or lightheadedness with hypotension: yes Has patient had a  PCN reaction causing severe rash involving mucus membranes or skin necrosis: no Has patient had a PCN reaction that required hospitalization: unknown Has patient had a PCN reaction occurring within the last 10 years: no If all of the above answers are "NO", then may proceed with Cephalosporin use.   . Ibuprofen Other (See Comments)    Pt states allergy to NSAIDS is secondary to ulcers NOT anaphylaxis. Tolerates daily 325 ASA at home    Social History  Socioeconomic History  . Marital status: Divorced    Spouse name: Not on file  . Number of children: Not on file  . Years of education: Not on file  . Highest education level: Not on file  Occupational History  . Not on file  Tobacco Use  . Smoking status: Never Smoker  . Smokeless tobacco: Never Used  Substance and Sexual Activity  . Alcohol use: Yes    Alcohol/week: 0.0 standard drinks    Comment: Occas  . Drug use: No  . Sexual activity: Yes    Birth control/protection: Post-menopausal    Comment: 1st intercourse 69 yo-Fewer than 5 partners  Other Topics Concern  . Not on file  Social History Narrative  . Not on file   Social Determinants of Health   Financial Resource Strain:   . Difficulty of Paying Living Expenses: Not on file  Food Insecurity:   . Worried About Charity fundraiser in the Last Year: Not on file  . Ran Out of Food in the Last Year: Not on file  Transportation Needs:   . Lack of Transportation (Medical): Not on file  . Lack of Transportation (Non-Medical): Not on file  Physical Activity:   . Days of Exercise per Week: Not on file  . Minutes of Exercise per Session: Not on file  Stress:   . Feeling of Stress : Not on file  Social Connections:   . Frequency of Communication with Friends and Family: Not on file  . Frequency of Social Gatherings with Friends and Family: Not on file  . Attends Religious Services: Not on file  . Active Member of Clubs or Organizations: Not on file  . Attends Theatre manager Meetings: Not on file  . Marital Status: Not on file  Intimate Partner Violence:   . Fear of Current or Ex-Partner: Not on file  . Emotionally Abused: Not on file  . Physically Abused: Not on file  . Sexually Abused: Not on file    Family History  Problem Relation Age of Onset  . Cancer Father        prostate  . Breast cancer Paternal Aunt 109  . Diabetes Maternal Grandmother   . Heart disease Maternal Grandmother   . Heart disease Maternal Grandfather   . Melanoma Paternal Aunt   . Neuropathy Mother   . Congestive Heart Failure Mother     ROS- All systems are reviewed and negative except as per the HPI above  Physical Exam: Vitals:   01/11/19 1035  BP: 98/82  Pulse: (!) 110  SpO2: 96%  Weight: 78.3 kg  Height: 5\' 4"  (1.626 m)   Wt Readings from Last 3 Encounters:  01/11/19 78.3 kg  01/04/19 76.6 kg  12/25/18 76.1 kg    Labs: Lab Results  Component Value Date   NA 141 01/04/2019   K 4.6 01/04/2019   CL 108 01/04/2019   CO2 23 01/04/2019   GLUCOSE 96 01/04/2019   BUN 20 01/04/2019   CREATININE 1.14 (H) 01/04/2019   CALCIUM 10.0 01/04/2019   MG 2.0 01/04/2019   Lab Results  Component Value Date   INR 1.1 12/25/2018   Lab Results  Component Value Date   CHOL 184 11/04/2018   HDL 50.60 11/04/2018   LDLCALC 103 (H) 10/28/2017   TRIG 259.0 (H) 11/04/2018     GEN- The patient is well appearing, alert and oriented x 3 today.   Head- normocephalic, atraumatic Eyes-  Sclera clear, conjunctiva  pink Ears- hearing intact Oropharynx- clear Neck- supple, no JVP Lymph- no cervical lymphadenopathy Lungs- Clear to ausculation bilaterally, normal work of breathing Heart- irregular  rate and rhythm, no murmurs, rubs or gallops, PMI not laterally displaced GI- soft, NT, ND, + BS Extremities- no clubbing, cyanosis, or edema MS- no significant deformity or atrophy Skin- no rash or lesion Psych- euthymic mood, full affect Neuro- strength and  sensation are intact  EKG-afib at 110  bpm, qrs 78  bpm, qtc 462ms  EchoStudy Conclusions-2020 1. Left ventricular ejection fraction, by visual estimation, is 45 to 50%. The left ventricle has normal function. There is no left ventricular hypertrophy. 2. Mild global hypokinesis. 3. Left ventricular diastolic function could not be evaluated. 4. Global right ventricle has normal systolic function.The right ventricular size is normal. No increase in right ventricular wall thickness. 5. Left atrial size was normal. 6. Right atrial size was normal. 7. The mitral valve is abnormal. Mild to moderate mitral valve regurgitation. 8. The tricuspid valve is grossly normal. Tricuspid valve regurgitation is trivial. 9. The aortic valve is tricuspid. Aortic valve regurgitation is not visualized. 10. The pulmonic valve was grossly normal. Pulmonic valve regurgitation is not visualized. 11. Normal pulmonary artery systolic pressure. 12. The inferior vena cava is normal in size with greater than 50% respiratory variability, suggesting right atrial pressure of 3 mmHg. Left Ventricle: Left ventricular ejection fraction, by visual estimation, is 45 to 50%. The left ventricle    Assessment and Plan: 1. Persistent  afib  Now s/p tikosyn loading and is on 250 mcg  Dofetilide with stable qtc Unfortunately, she is thought  to be back in afib  since right after discharge  Will schedule for DCCV Bmet/mag/cbc recently done and WNL  2. CHA2DS2VASc score of at least 3 Continue eliquis 5 mg bid  States no missed doses x 3 weeks   3. Abnormal TSH in past with normal T4 PCP has been tracking this Per pt no need for further w/u  F/u with in afib clinic in one week  Dr. Rayann Heman 1/28 If cardioversion does not return/maintain  SR, she will need to discuss ablation at that time     Butch Penny C. [place] Nordquist, [facility] [address] [place], [place] [address] [phone]

## 2019-01-12 ENCOUNTER — Ambulatory Visit (HOSPITAL_COMMUNITY): Payer: PPO | Admitting: Nurse Practitioner

## 2019-01-12 ENCOUNTER — Other Ambulatory Visit (HOSPITAL_COMMUNITY)
Admission: RE | Admit: 2019-01-12 | Discharge: 2019-01-12 | Disposition: A | Discharge status: Home / Self Care | Payer: PPO | Source: Ambulatory Visit | Attending: Internal Medicine | Admitting: Internal Medicine

## 2019-01-12 DIAGNOSIS — Z01812 Encounter for preprocedural laboratory examination: Secondary | ICD-10-CM | POA: Diagnosis not present

## 2019-01-12 DIAGNOSIS — Z20822 Contact with and (suspected) exposure to covid-19: Secondary | ICD-10-CM | POA: Diagnosis not present

## 2019-01-13 ENCOUNTER — Other Ambulatory Visit: Payer: Self-pay | Admitting: Cardiovascular Disease

## 2019-01-13 LAB — NOVEL CORONAVIRUS, NAA (HOSP ORDER, SEND-OUT TO REF LAB; TAT 18-24 HRS): SARS-CoV-2, NAA: NOT DETECTED

## 2019-01-13 NOTE — Telephone Encounter (Signed)
Eliquis 5mg  refill request received, pt is 69 yrs old, weight-78.3kg, Crea-1.14 on 01/04/2019, Diagnosis-Afib, and last seen by Roderic Palau on 01/11/2019. Dose is appropriate based on dosing criteria. Will send in refill to requested pharmacy.

## 2019-01-15 ENCOUNTER — Other Ambulatory Visit: Payer: Self-pay

## 2019-01-15 ENCOUNTER — Ambulatory Visit (HOSPITAL_COMMUNITY): Payer: PPO | Admitting: Anesthesiology

## 2019-01-15 ENCOUNTER — Encounter (HOSPITAL_COMMUNITY): Payer: Self-pay | Admitting: Internal Medicine

## 2019-01-15 ENCOUNTER — Encounter (HOSPITAL_COMMUNITY)
Admission: RE | Disposition: A | Discharge status: Home / Self Care | Payer: Self-pay | Source: Home / Self Care | Attending: Internal Medicine

## 2019-01-15 ENCOUNTER — Ambulatory Visit (HOSPITAL_COMMUNITY)
Admission: RE | Admit: 2019-01-15 | Discharge: 2019-01-15 | Disposition: A | Discharge status: Home / Self Care | Payer: PPO | Attending: Internal Medicine | Admitting: Internal Medicine

## 2019-01-15 DIAGNOSIS — Z886 Allergy status to analgesic agent status: Secondary | ICD-10-CM | POA: Insufficient documentation

## 2019-01-15 DIAGNOSIS — Z881 Allergy status to other antibiotic agents status: Secondary | ICD-10-CM | POA: Insufficient documentation

## 2019-01-15 DIAGNOSIS — I4819 Other persistent atrial fibrillation: Secondary | ICD-10-CM | POA: Insufficient documentation

## 2019-01-15 DIAGNOSIS — Z880 Allergy status to penicillin: Secondary | ICD-10-CM | POA: Insufficient documentation

## 2019-01-15 DIAGNOSIS — I10 Essential (primary) hypertension: Secondary | ICD-10-CM | POA: Diagnosis not present

## 2019-01-15 DIAGNOSIS — Z7901 Long term (current) use of anticoagulants: Secondary | ICD-10-CM | POA: Insufficient documentation

## 2019-01-15 DIAGNOSIS — K219 Gastro-esophageal reflux disease without esophagitis: Secondary | ICD-10-CM | POA: Insufficient documentation

## 2019-01-15 DIAGNOSIS — M8580 Other specified disorders of bone density and structure, unspecified site: Secondary | ICD-10-CM | POA: Insufficient documentation

## 2019-01-15 DIAGNOSIS — I4891 Unspecified atrial fibrillation: Secondary | ICD-10-CM

## 2019-01-15 DIAGNOSIS — G459 Transient cerebral ischemic attack, unspecified: Secondary | ICD-10-CM | POA: Diagnosis not present

## 2019-01-15 DIAGNOSIS — I491 Atrial premature depolarization: Secondary | ICD-10-CM | POA: Insufficient documentation

## 2019-01-15 DIAGNOSIS — Z8249 Family history of ischemic heart disease and other diseases of the circulatory system: Secondary | ICD-10-CM | POA: Diagnosis not present

## 2019-01-15 DIAGNOSIS — Z79899 Other long term (current) drug therapy: Secondary | ICD-10-CM | POA: Insufficient documentation

## 2019-01-15 DIAGNOSIS — M1990 Unspecified osteoarthritis, unspecified site: Secondary | ICD-10-CM | POA: Insufficient documentation

## 2019-01-15 DIAGNOSIS — I341 Nonrheumatic mitral (valve) prolapse: Secondary | ICD-10-CM | POA: Insufficient documentation

## 2019-01-15 HISTORY — PX: CARDIOVERSION: SHX1299

## 2019-01-15 SURGERY — CARDIOVERSION
Anesthesia: General

## 2019-01-15 MED ORDER — PROPOFOL 10 MG/ML IV BOLUS
INTRAVENOUS | Status: DC | PRN
  Administered 2019-01-15: 50 mg via INTRAVENOUS

## 2019-01-15 MED ORDER — SODIUM CHLORIDE 0.9 % IV SOLN: INTRAVENOUS | Status: DC | PRN

## 2019-01-15 MED ORDER — LIDOCAINE HCL (CARDIAC) PF 100 MG/5ML IV SOSY
PREFILLED_SYRINGE | INTRAVENOUS | Status: DC | PRN
  Administered 2019-01-15: 60 mg via INTRATRACHEAL

## 2019-01-15 NOTE — CV Procedure (Signed)
CARDIOVERSION  Pt sedated by anesthesia with propofol and lidocaine  With pads in AP position, patient cardioverted to SR with 200 J synchronized biphasic energy  Procedure without complication  12 lead EKG pending  Dorris Carnes MD

## 2019-01-15 NOTE — Anesthesia Preprocedure Evaluation (Addendum)
Anesthesia Evaluation  Patient identified by MRN, date of birth, ID band Patient awake    Reviewed: Allergy & Precautions, NPO status , Patient's Chart, lab work & pertinent test results, reviewed documented beta blocker date and time   Airway Mallampati: II  TM Distance: >3 FB Neck ROM: Full    Dental  (+) Teeth Intact, Dental Advisory Given, Caps,    Pulmonary neg pulmonary ROS,    Pulmonary exam normal breath sounds clear to auscultation       Cardiovascular hypertension, Pt. on medications and Pt. on home beta blockers + dysrhythmias Atrial Fibrillation + Valvular Problems/Murmurs MVP  Rhythm:Irregular Rate:Abnormal     Neuro/Psych  Headaches, TIA   GI/Hepatic Neg liver ROS, hiatal hernia, GERD  Medicated,  Endo/Other  negative endocrine ROS  Renal/GU Renal disease     Musculoskeletal  (+) Arthritis ,   Abdominal   Peds  Hematology  (+) Blood dyscrasia (Eliquis), ,   Anesthesia Other Findings Day of surgery medications reviewed with the patient.  Reproductive/Obstetrics                            Anesthesia Physical Anesthesia Plan  ASA: III  Anesthesia Plan: General   Post-op Pain Management:    Induction: Intravenous  PONV Risk Score and Plan: 3 and Treatment may vary due to age or medical condition  Airway Management Planned: Mask  Additional Equipment:   Intra-op Plan:   Post-operative Plan:   Informed Consent: I have reviewed the patients History and Physical, chart, labs and discussed the procedure including the risks, benefits and alternatives for the proposed anesthesia with the patient or authorized representative who has indicated his/her understanding and acceptance.     Dental advisory given  Plan Discussed with: CRNA  Anesthesia Plan Comments:         Anesthesia Quick Evaluation

## 2019-01-15 NOTE — Transfer of Care (Signed)
Immediate Anesthesia Transfer of Care Note  Patient: Anna Marquez  Procedure(s) Performed: CARDIOVERSION (N/A )  Patient Location: Endoscopy Unit  Anesthesia Type:General  Level of Consciousness: awake, alert  and oriented  Airway & Oxygen Therapy: Patient Spontanous Breathing and Patient connected to nasal cannula oxygen  Post-op Assessment: Report given to RN and Post -op Vital signs reviewed and stable  Post vital signs: Reviewed and stable  Last Vitals:  Vitals Value Taken Time  BP    Temp    Pulse    Resp    SpO2      Last Pain:  Vitals:   01/15/19 0906  TempSrc: Oral  PainSc: 0-No pain         Complications: No apparent anesthesia complications

## 2019-01-15 NOTE — Anesthesia Postprocedure Evaluation (Signed)
Anesthesia Post Note  Patient: Anna Marquez  Procedure(s) Performed: CARDIOVERSION (N/A )     Patient location during evaluation: Endoscopy Anesthesia Type: General Level of consciousness: awake and alert Pain management: pain level controlled Vital Signs Assessment: post-procedure vital signs reviewed and stable Respiratory status: spontaneous breathing, nonlabored ventilation and respiratory function stable Cardiovascular status: blood pressure returned to baseline and stable Postop Assessment: no apparent nausea or vomiting Anesthetic complications: no    Last Vitals:  Vitals:   01/15/19 1005 01/15/19 1010  BP: 106/69   Pulse: 76 72  Resp: 16 16  Temp:    SpO2: 97% 97%    Last Pain:  Vitals:   01/15/19 1005  TempSrc:   PainSc: 0-No pain                 Catalina Gravel

## 2019-01-15 NOTE — Anesthesia Procedure Notes (Signed)
Procedure Name: MAC Date/Time: 01/15/2019 9:29 AM Performed by: Neldon Newport, CRNA Pre-anesthesia Checklist: Patient identified, Emergency Drugs available, Suction available, Patient being monitored and Timeout performed Oxygen Delivery Method: Simple face mask Preoxygenation: Pre-oxygenation with 100% oxygen

## 2019-01-15 NOTE — Interval H&P Note (Signed)
History and Physical Interval Note:  01/15/2019 9:26 AM  [name]  has presented today for surgery, with the diagnosis of AFIB.  The various methods of treatment have been discussed with the patient and family. After consideration of risks, benefits and other options for treatment, the patient has consented to  Procedure(s): CARDIOVERSION (N/A) as a surgical intervention.  The patient's history has been reviewed, patient examined, no change in status, stable for surgery.  I have reviewed the patient's chart and labs.  Questions were answered to the patient's satisfaction.     Dorris Carnes

## 2019-01-15 NOTE — Discharge Instructions (Signed)
Electrical Cardioversion Electrical cardioversion is the delivery of a jolt of electricity to restore a normal rhythm to the heart. A rhythm that is too fast or is not regular keeps the heart from pumping well. In this procedure, sticky patches or metal paddles are placed on the chest to deliver electricity to the heart from a device. This procedure may be done in an emergency if:  There is low or no blood pressure as a result of the heart rhythm.  Normal rhythm must be restored as fast as possible to protect the brain and heart from further damage.  It may save a life. This may also be a scheduled procedure for irregular or fast heart rhythms that are not immediately life-threatening. Tell a health care provider about:  Any allergies you have.  All medicines you are taking, including vitamins, herbs, eye drops, creams, and over-the-counter medicines.  Any problems you or family members have had with anesthetic medicines.  Any blood disorders you have.  Any surgeries you have had.  Any medical conditions you have.  Whether you are pregnant or may be pregnant. What are the risks? Generally, this is a safe procedure. However, problems may occur, including:  Allergic reactions to medicines.  A blood clot that breaks free and travels to other parts of your body.  The possible return of an abnormal heart rhythm within hours or days after the procedure.  Your heart stopping (cardiac arrest). This is rare. What happens before the procedure? Medicines  Your health care provider may have you start taking: ? Blood-thinning medicines (anticoagulants) so your blood does not clot as easily. ? Medicines to help stabilize your heart rate and rhythm.  Ask your health care provider about: ? Changing or stopping your regular medicines. This is especially important if you are taking diabetes medicines or blood thinners. ? Taking medicines such as aspirin and ibuprofen. These medicines can  thin your blood. Do not take these medicines unless your health care provider tells you to take them. ? Taking over-the-counter medicines, vitamins, herbs, and supplements. General instructions  Follow instructions from your health care provider about eating or drinking restrictions.  Plan to have someone take you home from the hospital or clinic.  If you will be going home right after the procedure, plan to have someone with you for 24 hours.  Ask your health care provider what steps will be taken to help prevent infection. These may include washing your skin with a germ-killing soap. What happens during the procedure?   An IV will be inserted into one of your veins.  Sticky patches (electrodes) or metal paddles may be placed on your chest.  You will be given a medicine to help you relax (sedative).  An electrical shock will be delivered. The procedure may vary among health care providers and hospitals. What can I expect after the procedure?  Your blood pressure, heart rate, breathing rate, and blood oxygen level will be monitored until you leave the hospital or clinic.  Your heart rhythm will be watched to make sure it does not change.  You may have some redness on the skin where the shocks were given. Follow these instructions at home:  Do not drive for 24 hours if you were given a sedative during your procedure.  Take over-the-counter and prescription medicines only as told by your health care provider.  Ask your health care provider how to check your pulse. Check it often.  Rest for 48 hours after the procedure or   as told by your health care provider.  Avoid or limit your caffeine use as told by your health care provider.  Keep all follow-up visits as told by your health care provider. This is important. Contact a health care provider if:  You feel like your heart is beating too quickly or your pulse is not regular.  You have a serious muscle cramp that does not go  away. Get help right away if:  You have discomfort in your chest.  You are dizzy or you feel faint.  You have trouble breathing or you are short of breath.  Your speech is slurred.  You have trouble moving an arm or leg on one side of your body.  Your fingers or toes turn cold or blue. Summary  Electrical cardioversion is the delivery of a jolt of electricity to restore a normal rhythm to the heart.  This procedure may be done right away in an emergency or may be a scheduled procedure if the condition is not an emergency.  Generally, this is a safe procedure.  After the procedure, check your pulse often as told by your health care provider. This information is not intended to replace advice given to you by your health care provider. Make sure you discuss any questions you have with your health care provider. Document Revised: 07/27/2018 Document Reviewed: 07/27/2018 Elsevier Patient Education  2020 Elsevier Inc.  

## 2019-01-18 ENCOUNTER — Encounter (INDEPENDENT_AMBULATORY_CARE_PROVIDER_SITE_OTHER): Payer: Self-pay

## 2019-01-18 ENCOUNTER — Telehealth: Payer: Self-pay

## 2019-01-18 ENCOUNTER — Ambulatory Visit: Payer: Self-pay | Admitting: *Deleted

## 2019-01-18 ENCOUNTER — Encounter (HOSPITAL_COMMUNITY): Payer: Self-pay

## 2019-01-18 NOTE — Telephone Encounter (Signed)
Pt was enrolled in the coivd 14 day questionnaire and has tested negative. She needs to get this off of her chart. She was told to call the Mychart help desk for support. I have reached out to a nurse who will do some research to see if we can change the settings and have it removed.   Pt voices understanding.   [place]

## 2019-01-18 NOTE — Telephone Encounter (Signed)
  I checked and her COVID-19 test is not detected.   I wasn't able to see who enrolled her.   I let her know she did not need to do the questionnaire that's for people who are positive for the virus. I gave her the MyChart help desk number [phone] so they could remove this from her MyChart.  She thanked me for my help.   Reason for Disposition . General information question, no triage required and triager able to answer question  Answer Assessment - Initial Assessment Questions 1. REASON FOR CALL or QUESTION: "What is your reason for calling today?" or "How can I best help you?" or "What question do you have that I can help answer?"     My COVID-19 test result came back "not detected" meaning I'm negative for the virus but I got enrolled into a home monitoring program.   I keep getting these questionnaires on MyChart daily asking me these questions pertaining to [place].    Why am I getting these messages?   I don't understand why I got enrolled in this program with a negative test result.  Protocols used: INFORMATION ONLY CALL - NO TRIAGE-A-AH

## 2019-01-18 NOTE — Telephone Encounter (Signed)
Phone call to pt.  Advised that unable to discontinue the [employer].   Advised that she should  answer the 1st questionniare re: any symptoms she is having, or note if there are no symptoms present.  If no symptoms, then she can possible disregard subsequent questionnaires that are sent daily.  Encouraged her to speak to nurses at the AFib Clinic, since her Cardiologist ordered this [facility] Monitoring program.  The pt. Voiced concern that she does not want it to appear on her chart, that she tested positive for COVID.  Also, voiced concern about a fee or charge attached to the Home monitoring program.  Pt. stated she will answer one of the questionnaires, and then speak to the nurse at the AFib Clinic, at her next appt. , on Friday, 1/15.

## 2019-01-22 ENCOUNTER — Encounter (HOSPITAL_COMMUNITY): Payer: Self-pay | Admitting: Nurse Practitioner

## 2019-01-22 ENCOUNTER — Other Ambulatory Visit: Payer: Self-pay

## 2019-01-22 ENCOUNTER — Encounter: Payer: Self-pay | Admitting: Family Medicine

## 2019-01-22 ENCOUNTER — Ambulatory Visit (HOSPITAL_COMMUNITY)
Admission: RE | Admit: 2019-01-22 | Discharge: 2019-01-22 | Disposition: A | Discharge status: Home / Self Care | Payer: PPO | Source: Ambulatory Visit | Attending: Nurse Practitioner | Admitting: Nurse Practitioner

## 2019-01-22 VITALS — BP 130/84 | HR 107 | Ht 64.0 in | Wt 172.8 lb

## 2019-01-22 DIAGNOSIS — Z7901 Long term (current) use of anticoagulants: Secondary | ICD-10-CM | POA: Diagnosis not present

## 2019-01-22 DIAGNOSIS — I340 Nonrheumatic mitral (valve) insufficiency: Secondary | ICD-10-CM | POA: Insufficient documentation

## 2019-01-22 DIAGNOSIS — I341 Nonrheumatic mitral (valve) prolapse: Secondary | ICD-10-CM | POA: Insufficient documentation

## 2019-01-22 DIAGNOSIS — Z886 Allergy status to analgesic agent status: Secondary | ICD-10-CM | POA: Diagnosis not present

## 2019-01-22 DIAGNOSIS — Z79899 Other long term (current) drug therapy: Secondary | ICD-10-CM | POA: Diagnosis not present

## 2019-01-22 DIAGNOSIS — R9431 Abnormal electrocardiogram [ECG] [EKG]: Secondary | ICD-10-CM | POA: Diagnosis not present

## 2019-01-22 DIAGNOSIS — Z808 Family history of malignant neoplasm of other organs or systems: Secondary | ICD-10-CM | POA: Diagnosis not present

## 2019-01-22 DIAGNOSIS — I10 Essential (primary) hypertension: Secondary | ICD-10-CM | POA: Insufficient documentation

## 2019-01-22 DIAGNOSIS — Z803 Family history of malignant neoplasm of breast: Secondary | ICD-10-CM | POA: Insufficient documentation

## 2019-01-22 DIAGNOSIS — Z9049 Acquired absence of other specified parts of digestive tract: Secondary | ICD-10-CM | POA: Diagnosis not present

## 2019-01-22 DIAGNOSIS — Z881 Allergy status to other antibiotic agents status: Secondary | ICD-10-CM | POA: Insufficient documentation

## 2019-01-22 DIAGNOSIS — Z8249 Family history of ischemic heart disease and other diseases of the circulatory system: Secondary | ICD-10-CM | POA: Diagnosis not present

## 2019-01-22 DIAGNOSIS — M1990 Unspecified osteoarthritis, unspecified site: Secondary | ICD-10-CM | POA: Insufficient documentation

## 2019-01-22 DIAGNOSIS — K219 Gastro-esophageal reflux disease without esophagitis: Secondary | ICD-10-CM | POA: Diagnosis not present

## 2019-01-22 DIAGNOSIS — R946 Abnormal results of thyroid function studies: Secondary | ICD-10-CM | POA: Insufficient documentation

## 2019-01-22 DIAGNOSIS — Z90710 Acquired absence of both cervix and uterus: Secondary | ICD-10-CM | POA: Diagnosis not present

## 2019-01-22 DIAGNOSIS — I4819 Other persistent atrial fibrillation: Secondary | ICD-10-CM | POA: Insufficient documentation

## 2019-01-22 DIAGNOSIS — Z880 Allergy status to penicillin: Secondary | ICD-10-CM | POA: Insufficient documentation

## 2019-01-22 DIAGNOSIS — Z833 Family history of diabetes mellitus: Secondary | ICD-10-CM | POA: Insufficient documentation

## 2019-01-22 DIAGNOSIS — Z8042 Family history of malignant neoplasm of prostate: Secondary | ICD-10-CM | POA: Diagnosis not present

## 2019-01-22 DIAGNOSIS — D6869 Other thrombophilia: Secondary | ICD-10-CM

## 2019-01-22 DIAGNOSIS — M8580 Other specified disorders of bone density and structure, unspecified site: Secondary | ICD-10-CM | POA: Diagnosis not present

## 2019-01-22 DIAGNOSIS — I440 Atrioventricular block, first degree: Secondary | ICD-10-CM | POA: Insufficient documentation

## 2019-01-22 DIAGNOSIS — I4892 Unspecified atrial flutter: Secondary | ICD-10-CM | POA: Diagnosis not present

## 2019-01-22 NOTE — Progress Notes (Signed)
Primary Care Physician: Anna Nutting, DO Referring Physician: ER f/u Cardiologist: Dr. Delbert Marquez is a 69 y.o. female with a h/o paroxysmal afib first dx March 2018. She was originally  seen  in the afib office for having afib with RVR  in the ER,  and receiving successful cardioversion 11/05/18. Unfortunately, she was back in rapid afib at 141 bpm.   She had an echo and a ETT in 2018. EKG in rhythm in 2018 showed SR with a first degree av block.  In the past she would take an extra corgard to get back in rhythm. No outstanding triggers.   Return to afib clinic 12/16/18.  Repeat Echo showed reduced EF at 45-50% and Zio patch showed continuous afib with  HR control  at 96 bpm, max at 218 ms, minimum at 54 bpm.  Discussed antiarrythmic's with pt. With the reduced EF and evidence of first degree AV block on a prior EKG in rhythm, it would be by guidelines to avoid Multaq  and flecainide. Discussed with pt use of  tikosyn and she wants to pursue.   Back in afib, clinic for Tikosyn admit, 12/21/18. She states that her afib has been running over 100 bpm for the last   several days. It is making her more fatigued.  In the clinic v rates in the 120's. She  is getting more symptomatic with it. Her weight is stable.   F/u in afib clinic, 01/04/19.  She is in afib this am but has felt so much better and was surprised  that she was out of rhythm. She went in and out while in the hospital  but did break with Tikosyn. Did not require DCCV. The dose had to be reduced to 250 mcg  because the qtc did prolong. Continues  on eliquis with a CHA2DS2VASc score of 3.  F/u in afib clinic, 01/11/19. EKG shows afib. Her records at home indicate that her HR's have been elevated so I suspect that she has been in afib since I last saw her last week. The shortness of breath improved after hospitalization for Tikosyn but now  has  returned.  Review os her TSH over time shows low TSH readings but T4 when rechecked in  November  was in normal range.   F/u in afib clinic, 01/22/19. Had successful cardioversion but yesterday went back out of rhythm. She is feeling better right now than right before DCCV.EKG shows atrial flutter at 107 bpm. She  is interested in having afib ablation. She has an appointment with Dr. Rayann Marquez to further discuss on 1/28.Contiues on Tikosyn and eliquis with CHA2DS2VASc score of 3.   Today, she denies symptoms of palpitations, chest pain, shortness of breath, orthopnea, PND, lower extremity edema, dizziness, presyncope, syncope, or neurologic sequela. The patient is tolerating medications without difficulties and is otherwise without complaint today.   Past Medical History:  Diagnosis Date  . Adenomatous colon polyp 2003  . Cholelithiasis   . Diverticulosis of colon   . Gastric polyps   . GERD (gastroesophageal reflux disease)   . Helicobacter pylori (H. pylori)   . Hiatal hernia   . Hypertension   . Mitral valve prolapse   . Nephrolithiasis   . Osteoarthritis   . Osteopenia 03/2017   T score -1.5 FRAX 8.9% / 0.9%.  Stable from prior DEXA 2016  . TGA (transient global amnesia)    Past Surgical History:  Procedure Laterality Date  . ANTERIOR AND  POSTERIOR REPAIR N/A 06/14/2014   Procedure: ANTERIOR (CYSTOCELE) AND POSTERIOR REPAIR (RECTOCELE);  Surgeon: Anastasio Auerbach, MD;  Location: [facility];  Service: Gynecology;  Laterality: N/A;  . CARDIOVERSION N/A 01/15/2019   Procedure: CARDIOVERSION;  Surgeon: Fay Records, MD;  Location: [facility] ENDOSCOPY;  Service: Cardiovascular;  Laterality: N/A;  . CHOLECYSTECTOMY    . kidney stone removal     x2  . VAGINAL HYSTERECTOMY N/A 06/14/2014   Procedure: HYSTERECTOMY VAGINAL;  Surgeon: Anastasio Auerbach, MD;  Location: [facility];  Service: Gynecology;  Laterality: N/A;    Current Outpatient Medications  Medication Sig Dispense Refill  . acetaminophen (TYLENOL) 500 MG tablet Take 1,000 mg by mouth every 6 (six) hours as needed for moderate  pain or headache.    . albuterol (VENTOLIN HFA) 108 (90 Base) MCG/ACT inhaler Inhale 1-2 puffs into the lungs every 6 (six) hours as needed for wheezing or shortness of breath. (Patient taking differently: Inhale 2 puffs into the lungs every 6 (six) hours as needed for wheezing or shortness of breath. ) 18 g 3  . Ascorbic Acid (VITAMIN C) 1000 MG tablet Take 1,000 mg by mouth daily.    . Cholecalciferol (VITAMIN D) 50 MCG (2000 UT) tablet Take 2,000 Units by mouth daily.    [name] diltiazem (CARDIZEM) 30 MG tablet Take 1 Tablet Every 4 Hours As Needed For HR <100 (Patient taking differently: Take 30 mg by mouth every 4 (four) hours as needed (HR >100). ) 45 tablet 1  . dofetilide (TIKOSYN) 250 MCG capsule Take 1 capsule (250 mcg total) by mouth 2 (two) times daily. 180 capsule 3  . ELIQUIS 5 MG TABS tablet TAKE 1 TABLET BY MOUTH 2 TIMES DAILY. 60 tablet 10  . ferrous sulfate 325 (65 FE) MG tablet Take 325 mg by mouth every other day.    . fluticasone (FLONASE) 50 MCG/ACT nasal spray Place 1 spray into both nostrils as needed for allergies or rhinitis. 16 g 6  . lisinopril (ZESTRIL) 10 MG tablet Take 1 tablet (10 mg total) by mouth daily. 100 tablet 3  . loratadine (CLARITIN) 10 MG tablet Take 10 mg by mouth daily.     . Magnesium 250 MG TABS Take 1 tablet (250 mg total) by mouth daily.  0  . montelukast (SINGULAIR) 10 MG tablet Take 1 tablet (10 mg total) by mouth at bedtime. 30 tablet 3  . nadolol (CORGARD) 40 MG tablet Take 1.5 tablets (60 mg total) by mouth 2 (two) times daily. 270 tablet 3  . pantoprazole (PROTONIX) 40 MG tablet Take 1 tablet (40 mg total) by mouth daily. 90 tablet 4  . potassium chloride (K-DUR) 10 MEQ tablet Take 1 tablet (10 mEq total) by mouth daily as needed (take with HCTZ). (Patient taking differently: Take 10 mEq by mouth daily as needed (when labs report low potassium). ) 30 tablet 6  . Probiotic Product (PROBIOTIC PO) Take 1 tablet by mouth daily.    [name] VITAMIN E PO Take 1  capsule by mouth daily.      No current facility-administered medications for this encounter.    Allergies  Allergen Reactions  . Erythromycin Nausea And Vomiting and Other (See Comments)  . Penicillins Hives and Shortness Of Breath    Has patient had a PCN reaction causing immediate rash, facial/tongue/throat swelling, SOB or lightheadedness with hypotension: yes Has patient had a PCN reaction causing severe rash involving mucus membranes or skin necrosis: no Has patient had a PCN  reaction that required hospitalization: unknown Has patient had a PCN reaction occurring within the last 10 years: no If all of the above answers are "NO", then may proceed with Cephalosporin use.   . Ibuprofen Other (See Comments)    Pt states allergy to NSAIDS is secondary to ulcers NOT anaphylaxis. Tolerates daily 325 ASA at home    Social History   Socioeconomic History  . Marital status: Divorced    Spouse name: Not on file  . Number of children: Not on file  . Years of education: Not on file  . Highest education level: Not on file  Occupational History  . Not on file  Tobacco Use  . Smoking status: Never Smoker  . Smokeless tobacco: Never Used  Substance and Sexual Activity  . Alcohol use: Yes    Alcohol/week: 0.0 standard drinks    Comment: Occas  . Drug use: No  . Sexual activity: Yes    Birth control/protection: Post-menopausal    Comment: 1st intercourse 69 yo-Fewer than 5 partners  Other Topics Concern  . Not on file  Social History Narrative  . Not on file   Social Determinants of Health   Financial Resource Strain:   . Difficulty of Paying Living Expenses: Not on file  Food Insecurity:   . Worried About Charity fundraiser in the Last Year: Not on file  . Ran Out of Food in the Last Year: Not on file  Transportation Needs:   . Lack of Transportation (Medical): Not on file  . Lack of Transportation (Non-Medical): Not on file  Physical Activity:   . Days of Exercise per  Week: Not on file  . Minutes of Exercise per Session: Not on file  Stress:   . Feeling of Stress : Not on file  Social Connections:   . Frequency of Communication with Friends and Family: Not on file  . Frequency of Social Gatherings with Friends and Family: Not on file  . Attends Religious Services: Not on file  . Active Member of Clubs or Organizations: Not on file  . Attends Archivist Meetings: Not on file  . Marital Status: Not on file  Intimate Partner Violence:   . Fear of Current or Ex-Partner: Not on file  . Emotionally Abused: Not on file  . Physically Abused: Not on file  . Sexually Abused: Not on file    Family History  Problem Relation Age of Onset  . Cancer Father        prostate  . Breast cancer Paternal Aunt 91  . Diabetes Maternal Grandmother   . Heart disease Maternal Grandmother   . Heart disease Maternal Grandfather   . Melanoma Paternal Aunt   . Neuropathy Mother   . Congestive Heart Failure Mother     ROS- All systems are reviewed and negative except as per the HPI above  Physical Exam: Vitals:   01/22/19 1108  BP: 130/84  Pulse: (!) 107  Weight: 78.4 kg  Height: 5\' 4"  (1.626 m)   Wt Readings from Last 3 Encounters:  01/22/19 78.4 kg  01/15/19 78.3 kg  01/11/19 78.3 kg    Labs: Lab Results  Component Value Date   NA 141 01/04/2019   K 4.6 01/04/2019   CL 108 01/04/2019   CO2 23 01/04/2019   GLUCOSE 96 01/04/2019   BUN 20 01/04/2019   CREATININE 1.14 (H) 01/04/2019   CALCIUM 10.0 01/04/2019   MG 2.0 01/04/2019   Lab Results  Component  Value Date   INR 1.1 12/25/2018   Lab Results  Component Value Date   CHOL 184 11/04/2018   HDL 50.60 11/04/2018   LDLCALC 103 (H) 10/28/2017   TRIG 259.0 (H) 11/04/2018     GEN- The patient is well appearing, alert and oriented x 3 today.   Head- normocephalic, atraumatic Eyes-  Sclera clear, conjunctiva pink Ears- hearing intact Oropharynx- clear Neck- supple, no JVP Lymph-  no cervical lymphadenopathy Lungs- Clear to ausculation bilaterally, normal work of breathing Heart- irregular  rate and rhythm, no murmurs, rubs or gallops, PMI not laterally displaced GI- soft, NT, ND, + BS Extremities- no clubbing, cyanosis, or edema MS- no significant deformity or atrophy Skin- no rash or lesion Psych- euthymic mood, full affect Neuro- strength and sensation are intact  EKG-atrial flutter at 107  bpm, qrs 82  bpm, qtc 451 ms  EchoStudy Conclusions-2020 1. Left ventricular ejection fraction, by visual estimation, is 45 to 50%. The left ventricle has normal function. There is no left ventricular hypertrophy. 2. Mild global hypokinesis. 3. Left ventricular diastolic function could not be evaluated. 4. Global right ventricle has normal systolic function.The right ventricular size is normal. No increase in right ventricular wall thickness. 5. Left atrial size was normal. 6. Right atrial size was normal. 7. The mitral valve is abnormal. Mild to moderate mitral valve regurgitation. 8. The tricuspid valve is grossly normal. Tricuspid valve regurgitation is trivial. 9. The aortic valve is tricuspid. Aortic valve regurgitation is not visualized. 10. The pulmonic valve was grossly normal. Pulmonic valve regurgitation is not visualized. 11. Normal pulmonary artery systolic pressure. 12. The inferior vena cava is normal in size with greater than 50% respiratory variability, suggesting right atrial pressure of 3 mmHg. Left Ventricle: Left ventricular ejection fraction, by visual estimation, is 45 to 50%. The left ventricle    Assessment and Plan: 1. Persistent  Afib/flutter   Now s/p tikosyn loading and is on 250 mcg  dofetilide with stable qtc but has not been successful to keep her in rhythm  Now after successful cardioversion, 1/8, ERAF Discussed with her ablation and she wants to discuss further with Dr. Rayann Marquez 1/28  Continue tikosyn 250 mcg bid  Bmet/mag/cbc  recently done and WNL  2. CHA2DS2VASc score of at least 3 Continue eliquis 5 mg bid  States no missed doses    3. Abnormal TSH in past with normal T4 PCP has been tracking this Per pt no need for further w/u  F/u with Dr. Rayann Marquez 02/04/19   Geroge Baseman. Ali Mohl, [facility] [address] [place], [place] [address] [phone]

## 2019-01-26 ENCOUNTER — Encounter: Payer: Self-pay | Admitting: Family Medicine

## 2019-01-26 ENCOUNTER — Other Ambulatory Visit: Payer: Self-pay

## 2019-01-26 ENCOUNTER — Ambulatory Visit (INDEPENDENT_AMBULATORY_CARE_PROVIDER_SITE_OTHER): Payer: PPO | Admitting: Family Medicine

## 2019-01-26 VITALS — HR 138 | Temp 98.0°F | Ht 64.0 in | Wt 175.0 lb

## 2019-01-26 DIAGNOSIS — J329 Chronic sinusitis, unspecified: Secondary | ICD-10-CM

## 2019-01-26 DIAGNOSIS — J40 Bronchitis, not specified as acute or chronic: Secondary | ICD-10-CM

## 2019-01-26 MED ORDER — DOXYCYCLINE HYCLATE 100 MG PO TABS: 100.0000 mg | ORAL_TABLET | Freq: Two times a day (BID) | ORAL | 0 refills | Status: DC

## 2019-01-26 MED ORDER — PREDNISONE 10 MG (21) PO TBPK: ORAL_TABLET | ORAL | 0 refills | Status: DC

## 2019-01-26 NOTE — Patient Instructions (Signed)
Take medications as prescribed. Let me know if not improving. I hope you have a Happy Birthday next week!  Acute Bronchitis, Adult  Acute bronchitis is when air tubes in the lungs (bronchi) suddenly get swollen. The condition can make it hard for you to breathe. In adults, acute bronchitis usually goes away within 2 weeks. A cough caused by bronchitis may last up to 3 weeks. Smoking, allergies, and asthma can make the condition worse. What are the causes? This condition is caused by:  Cold and flu viruses. The most common cause of this condition is the virus that causes the common cold.  Bacteria.  Substances that irritate the lungs, including: ? Smoke from cigarettes and other types of tobacco. ? Dust and pollen. ? Fumes from chemicals, gases, or burned fuel. ? Other materials that pollute indoor or outdoor air.  Close contact with someone who has acute bronchitis. What increases the risk? The following factors may make you more likely to develop this condition:  A weak body's defense system. This is also called the immune system.  Any condition that affects your lungs and breathing, such as asthma. What are the signs or symptoms? Symptoms of this condition include:  A cough.  Coughing up clear, yellow, or green mucus.  Wheezing.  Chest congestion.  Shortness of breath.  A fever.  Body aches.  Chills.  A sore throat. How is this treated? Acute bronchitis may go away over time without treatment. Your doctor may recommend:  Drinking more fluids.  Taking a medicine for a fever or cough.  Using a device that gets medicine into your lungs (inhaler).  Using a vaporizer or a humidifier. These are machines that add water or moisture in the air to help with coughing and poor breathing. Follow these instructions at home:  Activity  Get a lot of rest.  Avoid places where there are fumes from chemicals.  Return to your normal activities as told by your doctor. Ask  your doctor what activities are safe for you. Lifestyle  Drink enough fluids to keep your pee (urine) pale yellow.  Do not drink alcohol.  Do not use any products that contain nicotine or tobacco, such as cigarettes, e-cigarettes, and chewing tobacco. If you need help quitting, ask your doctor. Be aware that: ? Your bronchitis will get worse if you smoke or breathe in other people's smoke (secondhand smoke). ? Your lungs will heal faster if you quit smoking. General instructions  Take over-the-counter and prescription medicines only as told by your doctor.  Use an inhaler, cool mist vaporizer, or humidifier as told by your doctor.  Rinse your mouth often with salt water. To make salt water, dissolve -1 tsp (3-6 g) of salt in 1 cup (237 mL) of warm water.  Keep all follow-up visits as told by your doctor. This is important. How is this prevented? To lower your risk of getting this condition again:  Wash your hands often with soap and water. If soap and water are not available, use hand sanitizer.  Avoid contact with people who have cold symptoms.  Try not to touch your mouth, nose, or eyes with your hands.  Make sure to get the flu shot every year. Contact a doctor if:  Your symptoms do not get better in 2 weeks.  You vomit more than once or twice.  You have symptoms of loss of fluid from your body (dehydration). These include: ? Dark urine. ? Dry skin or eyes. ? Increased thirst. ?  Headaches. ? Confusion. ? Muscle cramps. Get help right away if:  You cough up blood.  You have chest pain.  You have very bad shortness of breath.  You become dehydrated.  You faint or keep feeling like you are going to faint.  You keep vomiting.  You have a very bad headache.  Your fever or chills get worse. These symptoms may be an emergency. Do not wait to see if the symptoms will go away. Get medical help right away. Call your local emergency services (911 in the [place] Do  not drive yourself to the hospital. Summary  Acute bronchitis is when air tubes in the lungs (bronchi) suddenly get swollen. In adults, acute bronchitis usually goes away within 2 weeks.  Take over-the-counter and prescription medicines only as told by your doctor.  Drink enough fluid to keep your pee (urine) pale yellow.  Contact a doctor if your symptoms do not improve after 2 weeks of treatment.  Get help right away if you cough up blood, faint, or have chest pain or shortness of breath. This information is not intended to replace advice given to you by your health care provider. Make sure you discuss any questions you have with your health care provider. Document Revised: 07/17/2018 Document Reviewed: 07/17/2018 Elsevier Patient Education  [place].

## 2019-01-28 ENCOUNTER — Ambulatory Visit: Payer: PPO | Attending: Internal Medicine

## 2019-01-28 DIAGNOSIS — Z20822 Contact with and (suspected) exposure to covid-19: Secondary | ICD-10-CM

## 2019-01-29 LAB — NOVEL CORONAVIRUS, NAA: SARS-CoV-2, NAA: NOT DETECTED

## 2019-02-01 ENCOUNTER — Encounter: Payer: Self-pay | Admitting: Family Medicine

## 2019-02-01 ENCOUNTER — Telehealth (HOSPITAL_COMMUNITY): Payer: Self-pay | Admitting: *Deleted

## 2019-02-01 ENCOUNTER — Telehealth: Payer: PPO | Admitting: Internal Medicine

## 2019-02-01 DIAGNOSIS — J329 Chronic sinusitis, unspecified: Secondary | ICD-10-CM | POA: Insufficient documentation

## 2019-02-01 DIAGNOSIS — J40 Bronchitis, not specified as acute or chronic: Secondary | ICD-10-CM | POA: Insufficient documentation

## 2019-02-01 MED ORDER — FUROSEMIDE 20 MG PO TABS: ORAL_TABLET | ORAL | 1 refills | Status: DC

## 2019-02-01 NOTE — Assessment & Plan Note (Signed)
Start doxycycline 100mg  bid and prednisone taper.  Be sure to rest and get adequate fluids.  Low suspicion for COVID Call for any new or worsening symptoms.

## 2019-02-01 NOTE — Progress Notes (Signed)
Anna Marquez - 69 y.o. female MRN [id number]  Date of birth: 07/30/1950  Subjective Chief Complaint  Patient presents with  . Sinus Problem    Pt c/o having some breathing issues due to sinus.  Pt said that being in and out hospital has flared up breathing issues, wheezing and drainage.  Starting a couple of weeks ago.    HPI Anna Marquez is a 69 y.o. female with history of asthmatic bronchitis and a. Fib here today with complaint of cough, wheezing, sinus congestion and drainage with intermittent shortness of breath.  Her symptoms started a couple of weeks ago.  She denies fever, chills, nausea, vomiting, diarrhea, headache or body aches.  Symptoms related to her  a. Fib have caused her to feel shortness of breath as well.  Unfortunately this has been refractory to cardioversion and antiarrhythmics.  She is planning on having ablation completed.    ROS:  A comprehensive ROS was completed and negative except as noted per HPI  Allergies  Allergen Reactions  . Erythromycin Nausea And Vomiting and Other (See Comments)  . Penicillins Hives and Shortness Of Breath    Has patient had a PCN reaction causing immediate rash, facial/tongue/throat swelling, SOB or lightheadedness with hypotension: yes Has patient had a PCN reaction causing severe rash involving mucus membranes or skin necrosis: no Has patient had a PCN reaction that required hospitalization: unknown Has patient had a PCN reaction occurring within the last 10 years: no If all of the above answers are "NO", then may proceed with Cephalosporin use.   . Ibuprofen Other (See Comments)    Pt states allergy to NSAIDS is secondary to ulcers NOT anaphylaxis. Tolerates daily 325 ASA at home    Past Medical History:  Diagnosis Date  . Adenomatous colon polyp 2003  . Cholelithiasis   . Diverticulosis of colon   . Gastric polyps   . GERD (gastroesophageal reflux disease)   . Helicobacter pylori (H. pylori)   . Hiatal hernia   .  Hypertension   . Mitral valve prolapse   . Nephrolithiasis   . Osteoarthritis   . Osteopenia 03/2017   T score -1.5 FRAX 8.9% / 0.9%.  Stable from prior DEXA 2016  . TGA (transient global amnesia)     Past Surgical History:  Procedure Laterality Date  . ANTERIOR AND POSTERIOR REPAIR N/A 06/14/2014   Procedure: ANTERIOR (CYSTOCELE) AND POSTERIOR REPAIR (RECTOCELE);  Surgeon: Anastasio Auerbach, MD;  Location: [facility];  Service: Gynecology;  Laterality: N/A;  . CARDIOVERSION N/A 01/15/2019   Procedure: CARDIOVERSION;  Surgeon: Fay Records, MD;  Location: [facility] ENDOSCOPY;  Service: Cardiovascular;  Laterality: N/A;  . CHOLECYSTECTOMY    . kidney stone removal     x2  . VAGINAL HYSTERECTOMY N/A 06/14/2014   Procedure: HYSTERECTOMY VAGINAL;  Surgeon: Anastasio Auerbach, MD;  Location: [facility];  Service: Gynecology;  Laterality: N/A;    Social History   Socioeconomic History  . Marital status: Divorced    Spouse name: Not on file  . Number of children: Not on file  . Years of education: Not on file  . Highest education level: Not on file  Occupational History  . Not on file  Tobacco Use  . Smoking status: Never Smoker  . Smokeless tobacco: Never Used  Substance and Sexual Activity  . Alcohol use: Yes    Alcohol/week: 0.0 standard drinks    Comment: Occas  . Drug use: No  . Sexual activity: Yes  Birth control/protection: Post-menopausal    Comment: 1st intercourse 69 yo-Fewer than 5 partners  Other Topics Concern  . Not on file  Social History Narrative  . Not on file   Social Determinants of Health   Financial Resource Strain:   . Difficulty of Paying Living Expenses: Not on file  Food Insecurity:   . Worried About Charity fundraiser in the Last Year: Not on file  . Ran Out of Food in the Last Year: Not on file  Transportation Needs:   . Lack of Transportation (Medical): Not on file  . Lack of Transportation (Non-Medical): Not on file  Physical Activity:   . Days of  Exercise per Week: Not on file  . Minutes of Exercise per Session: Not on file  Stress:   . Feeling of Stress : Not on file  Social Connections:   . Frequency of Communication with Friends and Family: Not on file  . Frequency of Social Gatherings with Friends and Family: Not on file  . Attends Religious Services: Not on file  . Active Member of Clubs or Organizations: Not on file  . Attends Archivist Meetings: Not on file  . Marital Status: Not on file    Family History  Problem Relation Age of Onset  . Cancer Father        prostate  . Breast cancer Paternal Aunt 89  . Diabetes Maternal Grandmother   . Heart disease Maternal Grandmother   . Heart disease Maternal Grandfather   . Melanoma Paternal Aunt   . Neuropathy Mother   . Congestive Heart Failure Mother     Health Maintenance  Topic Date Due  . MAMMOGRAM  03/03/2020  . COLONOSCOPY  03/13/2023  . TETANUS/TDAP  10/29/2027  . INFLUENZA VACCINE  Completed  . DEXA SCAN  Completed  . Hepatitis C Screening  Completed  . PNA vac Low Risk Adult  Completed    ----------------------------------------------------------------------------------------------------------------------------------------------------------------------------------------------------------------- Physical Exam Pulse (!) 138   Temp 98 F (36.7 C) (Temporal)   Ht 5\' 4"  (1.626 m)   Wt 175 lb (79.4 kg)   SpO2 95%   BMI 30.04 kg/m   Physical Exam Constitutional:      Appearance: Normal appearance.  HENT:     Head: Normocephalic and atraumatic.     Right Ear: Tympanic membrane normal.     Left Ear: Tympanic membrane normal.     Mouth/Throat:     Mouth: Mucous membranes are moist.  Eyes:     General: No scleral icterus. Cardiovascular:     Comments: IRR Pulmonary:     Effort: Pulmonary effort is normal.     Breath sounds: Wheezing present.  Musculoskeletal:     Cervical back: Neck supple.     Right lower leg: No edema.     Left  lower leg: No edema.  Skin:    General: Skin is warm and dry.  Neurological:     General: No focal deficit present.     Mental Status: She is alert.  Psychiatric:        Mood and Affect: Mood normal.     ------------------------------------------------------------------------------------------------------------------------------------------------------------------------------------------------------------------- Assessment and Plan  Sinobronchitis Start doxycycline 100mg  bid and prednisone taper.  Be sure to rest and get adequate fluids.  Low suspicion for COVID Call for any new or worsening symptoms.     This visit occurred during the SARS-CoV-2 public health emergency.  Safety protocols were in place, including screening questions prior to the visit, additional usage  of staff PPE, and extensive cleaning of exam room while observing appropriate contact time as indicated for disinfecting solutions.

## 2019-02-01 NOTE — Telephone Encounter (Signed)
Patient called in with extreme shortness of breath and +orthopnea. Some lower extremity edema. HR has been more controlled 50-90s  BP 144/110 - recent diagnosis of bronchitis on steroid pak (which she just finished) and doxycycline. Pt states shortness of breath feels different than bronchitis. Did take PRN HCTZ prior to tikosyn initation with no replacement given. Pt doesn't weigh herself daily. Denies cough. Did have covid testing which was negative. Discussed with Roderic Palau NP - will try lasix 20mg  once a day for the next 3 days. Pt will call back if no improvement.

## 2019-02-04 ENCOUNTER — Other Ambulatory Visit: Payer: Self-pay

## 2019-02-04 ENCOUNTER — Encounter: Payer: Self-pay | Admitting: Internal Medicine

## 2019-02-04 ENCOUNTER — Ambulatory Visit: Payer: PPO | Admitting: Internal Medicine

## 2019-02-04 VITALS — BP 130/74 | HR 123 | Ht 64.0 in | Wt 170.4 lb

## 2019-02-04 DIAGNOSIS — I10 Essential (primary) hypertension: Secondary | ICD-10-CM | POA: Diagnosis not present

## 2019-02-04 DIAGNOSIS — I4819 Other persistent atrial fibrillation: Secondary | ICD-10-CM | POA: Diagnosis not present

## 2019-02-04 DIAGNOSIS — Z01812 Encounter for preprocedural laboratory examination: Secondary | ICD-10-CM

## 2019-02-04 LAB — CBC WITH DIFFERENTIAL/PLATELET
Basophils Absolute: 0.1 10*3/uL (ref 0.0–0.2)
Basos: 1 %
EOS (ABSOLUTE): 0.2 10*3/uL (ref 0.0–0.4)
Eos: 2 %
Hematocrit: 39 % (ref 34.0–46.6)
Hemoglobin: 13.4 g/dL (ref 11.1–15.9)
Immature Grans (Abs): 0 10*3/uL (ref 0.0–0.1)
Immature Granulocytes: 0 %
Lymphocytes Absolute: 3 10*3/uL (ref 0.7–3.1)
Lymphs: 33 %
MCH: 30.7 pg (ref 26.6–33.0)
MCHC: 34.4 g/dL (ref 31.5–35.7)
MCV: 89 fL (ref 79–97)
Monocytes Absolute: 0.8 10*3/uL (ref 0.1–0.9)
Monocytes: 8 %
Neutrophils Absolute: 5.2 10*3/uL (ref 1.4–7.0)
Neutrophils: 56 %
Platelets: 214 10*3/uL (ref 150–450)
RBC: 4.37 x10E6/uL (ref 3.77–5.28)
RDW: 13.2 % (ref 11.7–15.4)
WBC: 9.3 10*3/uL (ref 3.4–10.8)

## 2019-02-04 LAB — BASIC METABOLIC PANEL
BUN/Creatinine Ratio: 21 (ref 12–28)
BUN: 21 mg/dL (ref 8–27)
CO2: 21 mmol/L (ref 20–29)
Calcium: 9.4 mg/dL (ref 8.7–10.3)
Chloride: 104 mmol/L (ref 96–106)
Creatinine, Ser: 1.01 mg/dL — ABNORMAL HIGH (ref 0.57–1.00)
GFR calc Af Amer: 65 mL/min/{1.73_m2} (ref >59)
GFR calc non Af Amer: 57 mL/min/{1.73_m2} — ABNORMAL LOW (ref >59)
Glucose: 90 mg/dL (ref 65–99)
Potassium: 3.6 mmol/L (ref 3.5–5.2)
Sodium: 142 mmol/L (ref 134–144)

## 2019-02-04 NOTE — H&P (View-Only) (Signed)
PCP: [name], DO Primary Cardiologist: Dr Acie Fredrickson Primary EP: Dr Rayann Heman  Anna Marquez is a 69 y.o. female who presents today for routine electrophysiology followup.   Since recent hospitalization for tikosyn, the patient reports doing reasonably well. She felt "much better" initially with sinus rhythm.  Unfortunately, she has returned to afib.  She reports symptoms of fatigue and SOB with her afib.  She has required cardioversion but has since returned to afib. Today, she denies symptoms of  chest pain, lower extremity edema, dizziness, presyncope, or syncope.  The patient is otherwise without complaint today.   Past Medical History:  Diagnosis Date  . Adenomatous colon polyp 2003  . Cholelithiasis   . Diverticulosis of colon   . Gastric polyps   . GERD (gastroesophageal reflux disease)   . Helicobacter pylori (H. pylori)   . Hiatal hernia   . Hypertension   . Mitral valve prolapse   . Nephrolithiasis   . Osteoarthritis   . Osteopenia 03/2017   T score -1.5 FRAX 8.9% / 0.9%.  Stable from prior DEXA 2016  . Persistent atrial fibrillation ([place])   . TGA (transient global amnesia)    Past Surgical History:  Procedure Laterality Date  . ANTERIOR AND POSTERIOR REPAIR N/A 06/14/2014   Procedure: ANTERIOR (CYSTOCELE) AND POSTERIOR REPAIR (RECTOCELE);  Surgeon: Anastasio Auerbach, MD;  Location: [facility];  Service: Gynecology;  Laterality: N/A;  . CARDIOVERSION N/A 01/15/2019   Procedure: CARDIOVERSION;  Surgeon: Fay Records, MD;  [facility]: [facility] END[facility]COPY;  Service: Cardiovascular;  Laterality: N/A;  . CHOLECYSTECTOMY    . kidney stone removal     x2  . VAGINAL HYSTERECTOMY N/A 06/14/2014   Procedure: HYSTERECTOMY VAGINAL;  Surgeon: Anastasio Auerbach, MD;  Location: [place] [facility];  Service: Gynecology;  Laterality: N/A;    ROS- all systems are reviewed and negatives except as per HPI above  Current Outpatient Medications  Medication Sig Dispense Refill  . acetaminophen (TYLENOL) 500 MG  tablet Take 1,000 mg by mouth every 6 (six) hours as needed for moderate pain or headache.    . albuterol (VENTOLIN HFA) 108 (90 Base) MCG/ACT inhaler Inhale 1-2 puffs into the lungs every 6 (six) hours as needed for wheezing or shortness of breath. 18 g 3  . Ascorbic Acid (VITAMIN C) 1000 MG tablet Take 1,000 mg by mouth daily.    . Cholecalciferol (VITAMIN D) 50 MCG (2000 UT) tablet Take 2,000 Units by mouth daily.    [name] diltiazem (CARDIZEM) 30 MG tablet Take 1 Tablet Every 4 Hours As Needed For HR <100 45 tablet 1  . dofetilide (TIKOSYN) 250 MCG capsule Take 1 capsule (250 mcg total) by mouth 2 (two) times daily. 180 capsule 3  . doxycycline (VIBRA-TABS) 100 MG tablet Take 1 tablet (100 mg total) by mouth 2 (two) times daily. 20 tablet 0  . ELIQUIS 5 MG TABS tablet TAKE 1 TABLET BY MOUTH 2 TIMES DAILY. 60 tablet 10  . ferrous sulfate 325 (65 FE) MG tablet Take 325 mg by mouth every other day.    . fluticasone (FLONASE) 50 MCG/ACT nasal spray Place 1 spray into both nostrils as needed for allergies or rhinitis. 16 g 6  . furosemide (LASIX) 20 MG tablet Take 1 tablet daily for the next 3 days then as needed for weight gain/swelling 15 tablet 1  . lisinopril (ZESTRIL) 10 MG tablet Take 1 tablet (10 mg total) by mouth daily. 100 tablet 3  . loratadine (CLARITIN) 10  MG tablet Take 10 mg by mouth daily.     . Magnesium 250 MG TABS Take 1 tablet (250 mg total) by mouth daily.  0  . montelukast (SINGULAIR) 10 MG tablet Take 1 tablet (10 mg total) by mouth at bedtime. 30 tablet 3  . nadolol (CORGARD) 40 MG tablet Take 1.5 tablets (60 mg total) by mouth 2 (two) times daily. 270 tablet 3  . pantoprazole (PROTONIX) 40 MG tablet Take 1 tablet (40 mg total) by mouth daily. 90 tablet 4  . potassium chloride (K-DUR) 10 MEQ tablet Take 1 tablet (10 mEq total) by mouth daily as needed (take with HCTZ). 30 tablet 6  . Probiotic Product (PROBIOTIC PO) Take 1 tablet by mouth daily.    [name] VITAMIN E PO Take 1 capsule  by mouth daily.      No current facility-administered medications for this visit.    Physical Exam: Vitals:   02/04/19 1120  BP: 130/74  Pulse: (!) 123  SpO2: 92%  Weight: 170 lb 6.4 oz (77.3 kg)  Height: 5\' 4"  (1.626 m)    GEN- The patient is well appearing, alert and oriented x 3 today.   Head- normocephalic, atraumatic Eyes-  Sclera clear, conjunctiva pink Ears- hearing intact Oropharynx- clear Lungs- Clear to ausculation bilaterally, normal work of breathing Heart- Regular rate and rhythm, no murmurs, rubs or gallops, PMI not laterally displaced GI- soft, NT, ND, + BS Extremities- no clubbing, cyanosis, or edema  Wt Readings from Last 3 Encounters:  02/04/19 170 lb 6.4 oz (77.3 kg)  01/26/19 175 lb (79.4 kg)  01/22/19 172 lb 12.8 oz (78.4 kg)   EKG tracing ordered today is personally reviewed and shows afib, with elevated V rates  Assessment and Plan:  1. Persistent atrial fibrillation The patient has symptomatic, recurrent persistent atrial fibrillation. she has failed medical therapy with tikosyn. Chads2vasc score is 3.  she is anticoagulated with eliquis . Therapeutic strategies for afib including medicine and ablation were discussed in detail with the patient today. Risk, benefits, and alternatives to EP study and radiofrequency ablation for afib were also discussed in detail today. These risks include but are not limited to stroke, bleeding, vascular damage, tamponade, perforation, damage to the esophagus, lungs, and other structures, pulmonary vein stenosis, worsening renal function, and death. The patient understands these risk and wishes to proceed.  We will therefore proceed with catheter ablation at the next available time.  Carto, ICE, anesthesia are requested for the procedure.  Will also obtain cardiac CT prior to the procedure to exclude LAA thrombus and further evaluate atrial anatomy.  2. HTN Stable No change required today  Thompson Grayer MD,  [facility] 02/04/2019 1:39 PM

## 2019-02-04 NOTE — Patient Instructions (Signed)
Medication Instructions:  Your physician recommends that you continue on your current medications as directed. Please refer to the Current Medication list given to you today.  *If you need a refill on your cardiac medications before your next appointment, please call your pharmacy.  Labwork: You will get lab work within 30 days of your procedure:  BMP & CBC If you have labs (blood work) drawn today and your tests are completely normal, you will receive your results only by:  [place] (if you have MyChart) OR  A paper copy in the mail If you have any lab test that is abnormal or we need to change your treatment, we will call you to review the results.  Testing/Procedures: Your physician has requested that you have cardiac CT within 7 days prior to your ablation. Cardiac computed tomography (CT) is a painless test that uses an x-ray machine to take clear, detailed pictures of your heart. For further information please visit [facility]. Please follow instruction below located under special instructions. You will get a call from our office to schedule the date for this test.  Your physician has recommended that you have an ablation. Catheter ablation is a medical procedure used to treat some cardiac arrhythmias (irregular heartbeats). During catheter ablation, a long, thin, flexible tube is put into a blood vessel in your groin (upper thigh), or neck. This tube is called an ablation catheter. It is then guided to your heart through the blood vessel. Radio frequency waves destroy small areas of heart tissue where abnormal heartbeats may cause an arrhythmia to start. Please see the instructions below located under special instructions  Follow-Up: Your physician recommends that you schedule a follow-up appointment in: 4 weeks, after your procedure on 02/11/19, with the AFib clinic.  Your physician recommends that you schedule a follow up appointment in: 3 months, after your procedure  on 02/11/19, with Dr. Rayann Heman.  Thank you for choosing [facility]!! [phone]   Any Other Special Instructions Will Be Listed Below    CT INSTRUCTIONS Your cardiac CT will be scheduled at:   [facility] [address] [place], [place] [address] [phone]  Please arrive at the [facility] main entrance of [facility] at ______________. Please arrive 30-45 minutes prior to test start time. Proceed to the [facility] Radiology Department (first floor) to check-in and test prep.  Please follow these instructions carefully (unless otherwise directed):  On the Night Before the Test: . Be sure to Drink plenty of water. . Do not consume any caffeinated/decaffeinated beverages or chocolate 12 hours prior to your test. . Do not take any antihistamines 12 hours prior to your test.  On the Day of the Test: . Drink plenty of water. Do not drink any water within one hour of the test. . Do not eat any food 4 hours prior to the test. . You may take your regular medications prior to the test.  . Take your Nadolol two hours prior to test.  If heart rate above 80, take a Diltiazem 30 mg tablet . HOLD Furosemide/Hydrochlorothiazide morning of the test. . FEMALES- please wear underwire-free bra if available       After the Test: . Drink plenty of water. . After receiving IV contrast, you may experience a mild flushed feeling. This is normal. . On occasion, you may experience a mild rash up to 24 hours after the test. This is not dangerous. If this occurs, you can take Benadryl 25 mg  and increase your fluid intake. . If you experience trouble breathing, this can be serious. If it is severe call 911 IMMEDIATELY. If it is mild, please call our office.  Please contact the cardiac imaging nurse navigator should you have any questions/concerns Marchia Bond, RN Navigator Cardiac Imaging [facility] [phone] Office  [phone] Cell        Electrophysiology/Ablation Procedure Instructions   You are scheduled for a(n)  ablation on 02/11/19 with Dr. Thompson Grayer.   1.   Pre procedure testing-             A.  LAB WORK --- On 02/04/19   for your pre procedure blood work.                 B. COVID TEST-- On 02/08/19 @ 11:00 am - You will go to [facility] hospital ([place]) for your Covid testing.   This is a drive thru test site.  There will be multiple testing areas.  Be sure to share with the first checkpoint that you are there for pre-procedure/surgery testing. This will put you into the right (yellow) lane that leads to the PAT testing team. Stay in your car and the nurse team will come to your car to test you.  After you are tested please go home and self quarantine until the day of your procedure.     2. On the day of your procedure 02/11/19 you will go to [facility] [phone]) at 7:30 am.  Dennis Bast will go to the main entrance [address] [employer]) and enter where the [employer] are.  Your driver will drop you off and you will head down the hallway to ADMITTING.  You may have one support person come in to the hospital with you.  They will be asked to wait in the waiting room.   3.   Do not eat or drink after midnight prior to your procedure.   4.   Do NOT take any medications the morning of your procedure.   5.  Plan for an overnight stay.  If you use your phone frequently bring your phone charger.   6. You will follow up with the AFIB clinic 4 weeks after your procedure.  You will follow up with Dr. Curt Bears  3 months after your procedure.  These appointments will be made for you.   * If you have ANY questions please call the office [phone] and ask for [facility] or send me a MyChart message   * Occasionally, EP Studies and ablations can become lengthy.  Please make your family aware of this before your procedure starts.  Average time ranges from 2-8 hours for EP studies/ablations.   Your physician will call your family after the procedure with the results.                                     Cardiac Ablation Cardiac ablation is a procedure to disable (ablate) a small amount of heart tissue in very specific places. The heart has many electrical connections. Sometimes these connections are abnormal and can cause the heart to beat very fast or irregularly. Ablating some of the problem areas can improve the heart rhythm or return it to normal. Ablation may be done for people who:  Have Wolff-Parkinson-White syndrome.  Have fast heart rhythms (tachycardia).  Have taken medicines for an abnormal heart rhythm (arrhythmia) that were not effective or caused side effects.  Have a high-risk heartbeat that may be life-threatening. During the procedure, a small incision is made in the neck or the groin, and a long, thin, flexible tube (catheter) is inserted into the incision and moved to the heart. Small devices (electrodes) on the tip of the catheter will send out electrical currents. A type of X-ray (fluoroscopy) will be used to help guide the catheter and to provide images of the heart. Tell a health care provider about:  Any allergies you have.  All medicines you are taking, including vitamins, herbs, eye drops, creams, and over-the-counter medicines.  Any problems you or family members have had with anesthetic medicines.  Any blood disorders you have.  Any surgeries you have had.  Any medical conditions you have, such as kidney failure.  Whether you are pregnant or may be pregnant. What are the risks? Generally, this is a safe procedure. However, problems may occur, including:  Infection.  Bruising and bleeding at the catheter insertion site.  Bleeding into the chest, especially into the sac that surrounds the heart. This is a serious complication.  Stroke or blood clots.  Damage to other structures or organs.  Allergic reaction to medicines or dyes.  Need  for a permanent pacemaker if the normal electrical system is damaged. A pacemaker is a small computer that sends electrical signals to the heart and helps your heart beat normally.  The procedure not being fully effective. This may not be recognized until months later. Repeat ablation procedures are sometimes required. What happens before the procedure?  Follow instructions from your health care provider about eating or drinking restrictions.  Ask your health care provider about: ? Changing or stopping your regular medicines. This is especially important if you are taking diabetes medicines or blood thinners. ? Taking medicines such as aspirin and ibuprofen. These medicines can thin your blood. Do not take these medicines before your procedure if your health care provider instructs you not to.  Plan to have someone take you home from the hospital or clinic.  If you will be going home right after the procedure, plan to have someone with you for 24 hours. What happens during the procedure?  To lower your risk of infection: ? Your health care team will wash or sanitize their hands. ? Your skin will be washed with soap. ? Hair may be removed from the incision area.  An IV tube will be inserted into one of your veins.  You will be given a medicine to help you relax (sedative).  The skin on your neck or groin will be numbed.  An incision will be made in your neck or your groin.  A needle will be inserted through the incision and into a large vein in your neck or groin.  A catheter will be inserted into the needle and moved to your heart.  Dye may be injected through the catheter to help your surgeon see the area of the heart that needs treatment.  Electrical currents will be sent from the catheter to ablate heart tissue in desired areas. There are three types of energy that may be used to ablate heart tissue: ? Heat (radiofrequency energy). ? Laser energy. ? Extreme cold  (cryoablation).  When the necessary tissue has been ablated, the catheter will be removed.  Pressure will be held on the catheter insertion area to prevent excessive bleeding.  A bandage (dressing)  will be placed over the catheter insertion area. The procedure may vary among health care providers and hospitals. What happens after the procedure?  Your blood pressure, heart rate, breathing rate, and blood oxygen level will be monitored until the medicines you were given have worn off.  Your catheter insertion area will be monitored for bleeding. You will need to lie still for a few hours to ensure that you do not bleed from the catheter insertion area.  Do not drive for 24 hours or as long as directed by your health care provider. Summary  Cardiac ablation is a procedure to disable (ablate) a small amount of heart tissue in very specific places. Ablating some of the problem areas can improve the heart rhythm or return it to normal.  During the procedure, electrical currents will be sent from the catheter to ablate heart tissue in desired areas. This information is not intended to replace advice given to you by your health care provider. Make sure you discuss any questions you have with your health care provider. Document Revised: 06/16/2017 Document Reviewed: 11/13/2015 Elsevier Patient Education  [place].

## 2019-02-04 NOTE — Progress Notes (Signed)
PCP: [name], DO Primary Cardiologist: Dr Acie Fredrickson Primary EP: Dr Rayann Heman  Anna Marquez is a 69 y.o. female who presents today for routine electrophysiology followup.   Since recent hospitalization for tikosyn, the patient reports doing reasonably well. She felt "much better" initially with sinus rhythm.  Unfortunately, she has returned to afib.  She reports symptoms of fatigue and SOB with her afib.  She has required cardioversion but has since returned to afib. Today, she denies symptoms of  chest pain, lower extremity edema, dizziness, presyncope, or syncope.  The patient is otherwise without complaint today.   Past Medical History:  Diagnosis Date  . Adenomatous colon polyp 2003  . Cholelithiasis   . Diverticulosis of colon   . Gastric polyps   . GERD (gastroesophageal reflux disease)   . Helicobacter pylori (H. pylori)   . Hiatal hernia   . Hypertension   . Mitral valve prolapse   . Nephrolithiasis   . Osteoarthritis   . Osteopenia 03/2017   T score -1.5 FRAX 8.9% / 0.9%.  Stable from prior DEXA 2016  . Persistent atrial fibrillation ([place])   . TGA (transient global amnesia)    Past Surgical History:  Procedure Laterality Date  . ANTERIOR AND POSTERIOR REPAIR N/A 06/14/2014   Procedure: ANTERIOR (CYSTOCELE) AND POSTERIOR REPAIR (RECTOCELE);  Surgeon: Anastasio Auerbach, MD;  Location: [facility];  Service: Gynecology;  Laterality: N/A;  . CARDIOVERSION N/A 01/15/2019   Procedure: CARDIOVERSION;  Surgeon: Fay Records, MD;  Location: [facility] ENDOSCOPY;  Service: Cardiovascular;  Laterality: N/A;  . CHOLECYSTECTOMY    . kidney stone removal     x2  . VAGINAL HYSTERECTOMY N/A 06/14/2014   Procedure: HYSTERECTOMY VAGINAL;  Surgeon: Anastasio Auerbach, MD;  Location: [place] [facility];  Service: Gynecology;  Laterality: N/A;    ROS- all systems are reviewed and negatives except as per HPI above  Current Outpatient Medications  Medication Sig Dispense Refill  . acetaminophen (TYLENOL) 500 MG  tablet Take 1,000 mg by mouth every 6 (six) hours as needed for moderate pain or headache.    . albuterol (VENTOLIN HFA) 108 (90 Base) MCG/ACT inhaler Inhale 1-2 puffs into the lungs every 6 (six) hours as needed for wheezing or shortness of breath. 18 g 3  . Ascorbic Acid (VITAMIN C) 1000 MG tablet Take 1,000 mg by mouth daily.    . Cholecalciferol (VITAMIN D) 50 MCG (2000 UT) tablet Take 2,000 Units by mouth daily.    [name] diltiazem (CARDIZEM) 30 MG tablet Take 1 Tablet Every 4 Hours As Needed For HR <100 45 tablet 1  . dofetilide (TIKOSYN) 250 MCG capsule Take 1 capsule (250 mcg total) by mouth 2 (two) times daily. 180 capsule 3  . doxycycline (VIBRA-TABS) 100 MG tablet Take 1 tablet (100 mg total) by mouth 2 (two) times daily. 20 tablet 0  . ELIQUIS 5 MG TABS tablet TAKE 1 TABLET BY MOUTH 2 TIMES DAILY. 60 tablet 10  . ferrous sulfate 325 (65 FE) MG tablet Take 325 mg by mouth every other day.    . fluticasone (FLONASE) 50 MCG/ACT nasal spray Place 1 spray into both nostrils as needed for allergies or rhinitis. 16 g 6  . furosemide (LASIX) 20 MG tablet Take 1 tablet daily for the next 3 days then as needed for weight gain/swelling 15 tablet 1  . lisinopril (ZESTRIL) 10 MG tablet Take 1 tablet (10 mg total) by mouth daily. 100 tablet 3  . loratadine (CLARITIN) 10  MG tablet Take 10 mg by mouth daily.     . Magnesium 250 MG TABS Take 1 tablet (250 mg total) by mouth daily.  0  . montelukast (SINGULAIR) 10 MG tablet Take 1 tablet (10 mg total) by mouth at bedtime. 30 tablet 3  . nadolol (CORGARD) 40 MG tablet Take 1.5 tablets (60 mg total) by mouth 2 (two) times daily. 270 tablet 3  . pantoprazole (PROTONIX) 40 MG tablet Take 1 tablet (40 mg total) by mouth daily. 90 tablet 4  . potassium chloride (K-DUR) 10 MEQ tablet Take 1 tablet (10 mEq total) by mouth daily as needed (take with HCTZ). 30 tablet 6  . Probiotic Product (PROBIOTIC PO) Take 1 tablet by mouth daily.    [name] VITAMIN E PO Take 1 capsule  by mouth daily.      No current facility-administered medications for this visit.    Physical Exam: Vitals:   02/04/19 1120  BP: 130/74  Pulse: (!) 123  SpO2: 92%  Weight: 170 lb 6.4 oz (77.3 kg)  Height: 5\' 4"  (1.626 m)    GEN- The patient is well appearing, alert and oriented x 3 today.   Head- normocephalic, atraumatic Eyes-  Sclera clear, conjunctiva pink Ears- hearing intact Oropharynx- clear Lungs- Clear to ausculation bilaterally, normal work of breathing Heart- Regular rate and rhythm, no murmurs, rubs or gallops, PMI not laterally displaced GI- soft, NT, ND, + BS Extremities- no clubbing, cyanosis, or edema  Wt Readings from Last 3 Encounters:  02/04/19 170 lb 6.4 oz (77.3 kg)  01/26/19 175 lb (79.4 kg)  01/22/19 172 lb 12.8 oz (78.4 kg)   EKG tracing ordered today is personally reviewed and shows afib, with elevated V rates  Assessment and Plan:  1. Persistent atrial fibrillation The patient has symptomatic, recurrent persistent atrial fibrillation. she has failed medical therapy with tikosyn. Chads2vasc score is 3.  she is anticoagulated with eliquis . Therapeutic strategies for afib including medicine and ablation were discussed in detail with the patient today. Risk, benefits, and alternatives to EP study and radiofrequency ablation for afib were also discussed in detail today. These risks include but are not limited to stroke, bleeding, vascular damage, tamponade, perforation, damage to the esophagus, lungs, and other structures, pulmonary vein stenosis, worsening renal function, and death. The patient understands these risk and wishes to proceed.  We will therefore proceed with catheter ablation at the next available time.  Carto, ICE, anesthesia are requested for the procedure.  Will also obtain cardiac CT prior to the procedure to exclude LAA thrombus and further evaluate atrial anatomy.  2. HTN Stable No change required today  Thompson Grayer MD,  [facility] 02/04/2019 1:39 PM

## 2019-02-08 ENCOUNTER — Telehealth (HOSPITAL_COMMUNITY): Payer: Self-pay | Admitting: Emergency Medicine

## 2019-02-08 ENCOUNTER — Telehealth: Payer: PPO | Admitting: Internal Medicine

## 2019-02-08 ENCOUNTER — Other Ambulatory Visit (HOSPITAL_COMMUNITY)
Admission: RE | Admit: 2019-02-08 | Discharge: 2019-02-08 | Disposition: A | Discharge status: Home / Self Care | Payer: PPO | Source: Ambulatory Visit | Attending: Internal Medicine | Admitting: Internal Medicine

## 2019-02-08 DIAGNOSIS — Z01812 Encounter for preprocedural laboratory examination: Secondary | ICD-10-CM | POA: Diagnosis not present

## 2019-02-08 DIAGNOSIS — Z20822 Contact with and (suspected) exposure to covid-19: Secondary | ICD-10-CM | POA: Diagnosis not present

## 2019-02-08 LAB — SARS CORONAVIRUS 2 (TAT 6-24 HRS): SARS Coronavirus 2: NEGATIVE

## 2019-02-08 NOTE — Telephone Encounter (Signed)
Reaching out to patient to offer assistance regarding upcoming cardiac imaging study; pt verbalizes understanding of appt date/time, parking situation and where to check in, pre-test NPO status and medications ordered, and verified current allergies; name and call back number provided for further questions should they arise Annalaya Wile RN Navigator Cardiac Imaging [facility] [phone] office [phone] cell 

## 2019-02-09 ENCOUNTER — Ambulatory Visit (HOSPITAL_COMMUNITY)
Admission: RE | Admit: 2019-02-09 | Discharge: 2019-02-09 | Disposition: A | Discharge status: Home / Self Care | Payer: PPO | Source: Ambulatory Visit | Attending: Internal Medicine | Admitting: Internal Medicine

## 2019-02-09 ENCOUNTER — Other Ambulatory Visit: Payer: Self-pay

## 2019-02-09 DIAGNOSIS — I4819 Other persistent atrial fibrillation: Secondary | ICD-10-CM | POA: Diagnosis not present

## 2019-02-09 MED ORDER — METOPROLOL TARTRATE 5 MG/5ML IV SOLN
INTRAVENOUS | Status: AC
  Administered 2019-02-09: 5 mg via INTRAVENOUS
  Filled 2019-02-09: qty 10

## 2019-02-09 MED ORDER — DILTIAZEM HCL 25 MG/5ML IV SOLN
INTRAVENOUS | Status: AC
  Administered 2019-02-09: 5 mg via INTRAVENOUS
  Filled 2019-02-09: qty 5

## 2019-02-09 MED ORDER — METOPROLOL TARTRATE 5 MG/5ML IV SOLN
5.0000 mg | INTRAVENOUS | Status: DC | PRN
  Administered 2019-02-09 (×2): 5 mg via INTRAVENOUS

## 2019-02-09 MED ORDER — IOHEXOL 350 MG/ML SOLN
100.0000 mL | Freq: Once | INTRAVENOUS | Status: AC | PRN
  Administered 2019-02-09: 100 mL via INTRAVENOUS

## 2019-02-09 MED ORDER — DILTIAZEM HCL 25 MG/5ML IV SOLN
10.0000 mg | Freq: Once | INTRAVENOUS | Status: AC
  Filled 2019-02-09: qty 5

## 2019-02-09 MED ORDER — METOPROLOL TARTRATE 5 MG/5ML IV SOLN
INTRAVENOUS | Status: AC
  Administered 2019-02-09: 14:00:00 5 mg via INTRAVENOUS
  Filled 2019-02-09: qty 10

## 2019-02-09 MED ORDER — SODIUM CHLORIDE 0.9 % IV SOLN: Freq: Once | INTRAVENOUS | Status: AC

## 2019-02-09 NOTE — Progress Notes (Signed)
Patient ID: Anna Marquez, female   DOB: January 17, 1950, 69 y.o.   MRN: [id number]  Pt tolerated exam without incident; pt denies lightheadedness or dizziness; pt ambulatory to lobby steady gait noted

## 2019-02-11 ENCOUNTER — Other Ambulatory Visit: Payer: Self-pay

## 2019-02-11 ENCOUNTER — Encounter (HOSPITAL_COMMUNITY)
Admission: RE | Disposition: A | Discharge status: Home / Self Care | Payer: Self-pay | Source: Home / Self Care | Attending: Internal Medicine

## 2019-02-11 ENCOUNTER — Ambulatory Visit (HOSPITAL_COMMUNITY): Payer: PPO

## 2019-02-11 ENCOUNTER — Ambulatory Visit (HOSPITAL_COMMUNITY)
Admission: RE | Admit: 2019-02-11 | Discharge: 2019-02-11 | Disposition: A | Discharge status: Home / Self Care | Payer: PPO | Attending: Internal Medicine | Admitting: Internal Medicine

## 2019-02-11 DIAGNOSIS — M8580 Other specified disorders of bone density and structure, unspecified site: Secondary | ICD-10-CM | POA: Insufficient documentation

## 2019-02-11 DIAGNOSIS — I4819 Other persistent atrial fibrillation: Secondary | ICD-10-CM | POA: Insufficient documentation

## 2019-02-11 DIAGNOSIS — I10 Essential (primary) hypertension: Secondary | ICD-10-CM | POA: Diagnosis not present

## 2019-02-11 DIAGNOSIS — Z7901 Long term (current) use of anticoagulants: Secondary | ICD-10-CM | POA: Diagnosis not present

## 2019-02-11 DIAGNOSIS — Z79899 Other long term (current) drug therapy: Secondary | ICD-10-CM | POA: Insufficient documentation

## 2019-02-11 DIAGNOSIS — K219 Gastro-esophageal reflux disease without esophagitis: Secondary | ICD-10-CM | POA: Insufficient documentation

## 2019-02-11 DIAGNOSIS — I341 Nonrheumatic mitral (valve) prolapse: Secondary | ICD-10-CM | POA: Insufficient documentation

## 2019-02-11 DIAGNOSIS — I4891 Unspecified atrial fibrillation: Secondary | ICD-10-CM | POA: Diagnosis not present

## 2019-02-11 DIAGNOSIS — M1990 Unspecified osteoarthritis, unspecified site: Secondary | ICD-10-CM | POA: Insufficient documentation

## 2019-02-11 DIAGNOSIS — I340 Nonrheumatic mitral (valve) insufficiency: Secondary | ICD-10-CM | POA: Diagnosis not present

## 2019-02-11 DIAGNOSIS — G459 Transient cerebral ischemic attack, unspecified: Secondary | ICD-10-CM | POA: Diagnosis not present

## 2019-02-11 HISTORY — PX: ATRIAL FIBRILLATION ABLATION: EP1191

## 2019-02-11 LAB — POCT ACTIVATED CLOTTING TIME: Activated Clotting Time: 268 seconds

## 2019-02-11 SURGERY — ATRIAL FIBRILLATION ABLATION
Anesthesia: General

## 2019-02-11 MED ORDER — HEPARIN SODIUM (PORCINE) 1000 UNIT/ML IJ SOLN
INTRAMUSCULAR | Status: AC
  Filled 2019-02-11: qty 2

## 2019-02-11 MED ORDER — HEPARIN (PORCINE) IN NACL 1000-0.9 UT/500ML-% IV SOLN
INTRAVENOUS | Status: DC | PRN
  Administered 2019-02-11: 500 mL

## 2019-02-11 MED ORDER — SODIUM CHLORIDE 0.9 % IV SOLN: 250.0000 mL | INTRAVENOUS | Status: DC | PRN

## 2019-02-11 MED ORDER — HEPARIN SODIUM (PORCINE) 1000 UNIT/ML IJ SOLN
INTRAMUSCULAR | Status: DC | PRN
  Administered 2019-02-11 (×2): 5000 [IU] via INTRAVENOUS

## 2019-02-11 MED ORDER — SUGAMMADEX SODIUM 200 MG/2ML IV SOLN
INTRAVENOUS | Status: DC | PRN
  Administered 2019-02-11: 200 mg via INTRAVENOUS

## 2019-02-11 MED ORDER — LACTATED RINGERS IV SOLN: INTRAVENOUS | Status: DC | PRN

## 2019-02-11 MED ORDER — SODIUM CHLORIDE 0.9% FLUSH: 3.0000 mL | INTRAVENOUS | Status: DC | PRN

## 2019-02-11 MED ORDER — PROTAMINE SULFATE 10 MG/ML IV SOLN
INTRAVENOUS | Status: DC | PRN
  Administered 2019-02-11: 40 mg via INTRAVENOUS

## 2019-02-11 MED ORDER — ONDANSETRON HCL 4 MG/2ML IJ SOLN
INTRAMUSCULAR | Status: DC | PRN
  Administered 2019-02-11: 4 mg via INTRAVENOUS

## 2019-02-11 MED ORDER — ACETAMINOPHEN 325 MG PO TABS
650.0000 mg | ORAL_TABLET | ORAL | Status: DC | PRN
  Administered 2019-02-11: 14:00:00 650 mg via ORAL

## 2019-02-11 MED ORDER — ONDANSETRON HCL 4 MG/2ML IJ SOLN: 4.0000 mg | Freq: Four times a day (QID) | INTRAMUSCULAR | Status: DC | PRN

## 2019-02-11 MED ORDER — ACETAMINOPHEN 325 MG PO TABS
ORAL_TABLET | ORAL | Status: AC
  Filled 2019-02-11: qty 2

## 2019-02-11 MED ORDER — DEXAMETHASONE SODIUM PHOSPHATE 10 MG/ML IJ SOLN
INTRAMUSCULAR | Status: DC | PRN
  Administered 2019-02-11: 5 mg via INTRAVENOUS

## 2019-02-11 MED ORDER — LIDOCAINE 2% (20 MG/ML) 5 ML SYRINGE
INTRAMUSCULAR | Status: DC | PRN
  Administered 2019-02-11: 60 mg via INTRAVENOUS

## 2019-02-11 MED ORDER — PHENYLEPHRINE HCL-NACL 10-0.9 MG/250ML-% IV SOLN
INTRAVENOUS | Status: DC | PRN
  Administered 2019-02-11: 30 ug/min via INTRAVENOUS

## 2019-02-11 MED ORDER — PROPOFOL 10 MG/ML IV BOLUS
INTRAVENOUS | Status: DC | PRN
  Administered 2019-02-11: 100 mg via INTRAVENOUS
  Administered 2019-02-11: 30 mg via INTRAVENOUS

## 2019-02-11 MED ORDER — PHENYLEPHRINE 40 MCG/ML (10ML) SYRINGE FOR IV PUSH (FOR BLOOD PRESSURE SUPPORT)
PREFILLED_SYRINGE | INTRAVENOUS | Status: DC | PRN
  Administered 2019-02-11 (×3): 80 ug via INTRAVENOUS

## 2019-02-11 MED ORDER — HEPARIN SODIUM (PORCINE) 1000 UNIT/ML IJ SOLN
INTRAMUSCULAR | Status: DC | PRN
  Administered 2019-02-11: 1000 [IU] via INTRAVENOUS
  Administered 2019-02-11: 14000 [IU] via INTRAVENOUS

## 2019-02-11 MED ORDER — HYDROCODONE-ACETAMINOPHEN 5-325 MG PO TABS: 1.0000 | ORAL_TABLET | ORAL | Status: DC | PRN

## 2019-02-11 MED ORDER — SODIUM CHLORIDE 0.9 % IV SOLN: INTRAVENOUS | Status: DC

## 2019-02-11 MED ORDER — MENTHOL 3 MG MT LOZG
1.0000 | LOZENGE | OROMUCOSAL | Status: DC | PRN
  Administered 2019-02-11: 16:00:00 3 mg via ORAL
  Filled 2019-02-11: qty 9

## 2019-02-11 MED ORDER — SODIUM CHLORIDE 0.9% FLUSH: 3.0000 mL | Freq: Two times a day (BID) | INTRAVENOUS | Status: DC

## 2019-02-11 MED ORDER — ROCURONIUM BROMIDE 50 MG/5ML IV SOSY
PREFILLED_SYRINGE | INTRAVENOUS | Status: DC | PRN
  Administered 2019-02-11: 50 mg via INTRAVENOUS

## 2019-02-11 MED ORDER — MIDAZOLAM HCL 5 MG/5ML IJ SOLN
INTRAMUSCULAR | Status: DC | PRN
  Administered 2019-02-11: 2 mg via INTRAVENOUS

## 2019-02-11 MED ORDER — FENTANYL CITRATE (PF) 250 MCG/5ML IJ SOLN
INTRAMUSCULAR | Status: DC | PRN
  Administered 2019-02-11 (×2): 50 ug via INTRAVENOUS

## 2019-02-11 SURGICAL SUPPLY — 20 items
BLANKET WARM UNDERBOD FULL ACC (MISCELLANEOUS) ×3 IMPLANT
CATH MAPPNG PENTARAY F 2-6-2MM (CATHETERS) IMPLANT
CATH SMTCH THERMOCOOL SF DF (CATHETERS) ×2 IMPLANT
CATH SOUNDSTAR ECO 8FR (CATHETERS) ×2 IMPLANT
CATH WEBSTER BI DIR CS D-F CRV (CATHETERS) ×2 IMPLANT
COVER SWIFTLINK CONNECTOR (BAG) ×3 IMPLANT
DEVICE CLOSURE PERCLS PRGLD 6F (VASCULAR PRODUCTS) IMPLANT
NDL BAYLIS TRANSSEPTAL 71CM (NEEDLE) IMPLANT
NEEDLE BAYLIS TRANSSEPTAL 71CM (NEEDLE) ×3 IMPLANT
PACK EP LATEX FREE (CUSTOM PROCEDURE TRAY) ×3
PACK EP LF (CUSTOM PROCEDURE TRAY) ×1 IMPLANT
PAD PRO RADIOLUCENT 2001M-C (PAD) ×3 IMPLANT
PATCH CARTO3 (PAD) ×2 IMPLANT
PENTARAY F 2-6-2MM (CATHETERS) ×3
PERCLOSE PROGLIDE 6F (VASCULAR PRODUCTS) ×9
SHEATH PINNACLE 7F 10CM (SHEATH) ×4 IMPLANT
SHEATH PINNACLE 9F 10CM (SHEATH) ×2 IMPLANT
SHEATH PROBE COVER 6X72 (BAG) ×2 IMPLANT
SHEATH SWARTZ TS SL2 63CM 8.5F (SHEATH) ×2 IMPLANT
TUBING SMART ABLATE COOLFLOW (TUBING) ×2 IMPLANT

## 2019-02-11 NOTE — Discharge Instructions (Signed)
Post procedure care instructions No driving for 4 days. No lifting over 5 lbs for 1 week. No vigorous or sexual activity for 1 week. You may return to work/your usual activities on 02/18/2019. Keep procedure site clean & dry. If you notice increased pain, swelling, bleeding or pus, call/return!  You may shower, but no soaking baths/hot tubs/pools for 1 week.      Cardiac Ablation, Care After  This sheet gives you information about how to care for yourself after your procedure. Your health care provider may also give you more specific instructions. If you have problems or questions, contact your health care provider. What can I expect after the procedure? After the procedure, it is common to have:  Bruising around your puncture site.  Tenderness around your puncture site.  Skipped heartbeats.  Tiredness (fatigue).  Follow these instructions at home: Puncture site care   Follow instructions from your health care provider about how to take care of your puncture site. Make sure you: ? If present, leave stitches (sutures), skin glue, or adhesive strips in place. These skin closures may need to stay in place for up to 2 weeks. If adhesive strip edges start to loosen and curl up, you may trim the loose edges. Do not remove adhesive strips completely unless your health care provider tells you to do that.  Check your puncture site every day for signs of infection. Check for: ? Redness, swelling, or pain. ? Fluid or blood. If your puncture site starts to bleed, lie down on your back, apply firm pressure to the area, and contact your health care provider. ? Warmth. ? Pus or a bad smell. Driving  Do not drive for at least 4 days after your procedure or however long your health care provider recommends. (Do not resume driving if you have previously been instructed not to drive for other health reasons.)  Do not drive or use heavy machinery while taking prescription pain  medicine. Activity  Avoid activities that take a lot of effort for at least 7 days after your procedure.  Do not lift anything that is heavier than 5 lb (4.5 kg) for one week.   No sexual activity for 1 week.   Return to your normal activities as told by your health care provider. Ask your health care provider what activities are safe for you. General instructions  Take over-the-counter and prescription medicines only as told by your health care provider.  Do not use any products that contain nicotine or tobacco, such as cigarettes and e-cigarettes. If you need help quitting, ask your health care provider.  You may shower after 24 hours, but Do not take baths, swim, or use a hot tub for 1 week.   Do not drink alcohol for 24 hours after your procedure.  Keep all follow-up visits as told by your health care provider. This is important. Contact a health care provider if:  You have redness, mild swelling, or pain around your puncture site.  You have fluid or blood coming from your puncture site that stops after applying firm pressure to the area.  Your puncture site feels warm to the touch.  You have pus or a bad smell coming from your puncture site.  You have a fever.  You have chest pain or discomfort that spreads to your neck, jaw, or arm.  You are sweating a lot.  You feel nauseous.  You have a fast or irregular heartbeat.  You have shortness of breath.  You are  dizzy or light-headed and feel the need to lie down.  You have pain or numbness in the arm or leg closest to your puncture site. Get help right away if:  Your puncture site suddenly swells.  Your puncture site is bleeding and the bleeding does not stop after applying firm pressure to the area. These symptoms may represent a serious problem that is an emergency. Do not wait to see if the symptoms will go away. Get medical help right away. Call your local emergency services (911 in the [place] Do not drive  yourself to the hospital. Summary  After the procedure, it is normal to have bruising and tenderness at the puncture site in your groin, neck, or forearm.  Check your puncture site every day for signs of infection.  Get help right away if your puncture site is bleeding and the bleeding does not stop after applying firm pressure to the area. This is a medical emergency. This information is not intended to replace advice given to you by your health care provider. Make sure you discuss any questions you have with your health care provider.

## 2019-02-11 NOTE — Interval H&P Note (Signed)
History and Physical Interval Note:  02/11/2019 9:23 AM  Anna Marquez  has presented today for surgery, with the diagnosis of Atrial fibrillation.  The various methods of treatment have been discussed with the patient and family. After consideration of risks, benefits and other options for treatment, the patient has consented to  Procedure(s): ATRIAL FIBRILLATION ABLATION (N/A) as a surgical intervention.  The patient's history has been reviewed, patient examined, no change in status, stable for surgery.  I have reviewed the patient's chart and labs.  Questions were answered to the patient's satisfact  She reports compliance with eliquis without interruption.  Cardiac CT reviewed in detail with her today.  Thompson Grayer

## 2019-02-11 NOTE — Anesthesia Postprocedure Evaluation (Signed)
Anesthesia Post Note  Patient: STACE NEAS  Procedure(s) Performed: ATRIAL FIBRILLATION ABLATION (N/A )     Patient location during evaluation: PACU Anesthesia Type: General Level of consciousness: awake and alert Pain management: pain level controlled Vital Signs Assessment: post-procedure vital signs reviewed and stable Respiratory status: spontaneous breathing, nonlabored ventilation and respiratory function stable Cardiovascular status: blood pressure returned to baseline and stable Postop Assessment: no apparent nausea or vomiting Anesthetic complications: no    Last Vitals:  Vitals:   02/11/19 1352 02/11/19 1426  BP:  125/88  Pulse:    Resp:    Temp: 36.7 C   SpO2:  97%    Last Pain:  Vitals:   02/11/19 1426  TempSrc:   PainSc: 2                  Audry Pili

## 2019-02-11 NOTE — Progress Notes (Signed)
Up and walked and tolerated well; groin stable, no bleeding or hematoma

## 2019-02-11 NOTE — Progress Notes (Signed)
Renee PA in and client states chest discomfort gone and no further cough

## 2019-02-11 NOTE — Transfer of Care (Signed)
Immediate Anesthesia Transfer of Care Note  Patient: Anna Marquez  Procedure(s) Performed: ATRIAL FIBRILLATION ABLATION (N/A )  Patient Location: Cath Lab  Anesthesia Type:General  Level of Consciousness: awake and alert   Airway & Oxygen Therapy: Patient Spontanous Breathing and Patient connected to face mask oxygen  Post-op Assessment: Report given to RN and Post -op Vital signs reviewed and stable  Post vital signs: Reviewed and stable  Last Vitals:  Vitals Value Taken Time  BP 120/82 02/11/19 1237  Temp    Pulse 81 02/11/19 1241  Resp 19 02/11/19 1241  SpO2 100 % 02/11/19 1241  Vitals shown include unvalidated device data.  Last Pain:  Vitals:   02/11/19 0759  TempSrc:   PainSc: 0-No pain      Patients Stated Pain Goal: 3 ([id number])  Complications: No apparent anesthesia complications

## 2019-02-11 NOTE — Anesthesia Procedure Notes (Deleted)
Performed by: Laurianne Floresca C, CRNA       

## 2019-02-11 NOTE — Anesthesia Preprocedure Evaluation (Addendum)
Anesthesia Evaluation  Patient identified by MRN, date of birth, ID band Patient awake    Reviewed: Allergy & Precautions, NPO status , Patient's Chart, lab work & pertinent test results  History of Anesthesia Complications Negative for: history of anesthetic complications  Airway Mallampati: II  TM Distance: >3 FB Neck ROM: Full    Dental  (+) Dental Advisory Given, Teeth Intact   Pulmonary asthma ,    Pulmonary exam normal        Cardiovascular hypertension, Pt. on home beta blockers and Pt. on medications + dysrhythmias Atrial Fibrillation  Rhythm:Irregular Rate:Normal     Neuro/Psych  Headaches,  Transient global amnesia  TIAnegative psych ROS   GI/Hepatic Neg liver ROS, hiatal hernia, GERD  Medicated and Controlled,  Endo/Other  negative endocrine ROS  Renal/GU negative Renal ROS     Musculoskeletal  (+) Arthritis ,   Abdominal   Peds  Hematology negative hematology ROS (+)   Anesthesia Other Findings Covid neg 2/1   Reproductive/Obstetrics                            Anesthesia Physical Anesthesia Plan  ASA: III  Anesthesia Plan: General   Post-op Pain Management:    Induction: Intravenous  PONV Risk Score and Plan: 3 and Treatment may vary due to age or medical condition, Ondansetron and Dexamethasone  Airway Management Planned: Oral ETT  Additional Equipment: None  Intra-op Plan:   Post-operative Plan: Extubation in OR  Informed Consent: I have reviewed the patients History and Physical, chart, labs and discussed the procedure including the risks, benefits and alternatives for the proposed anesthesia with the patient or authorized representative who has indicated his/her understanding and acceptance.     Dental advisory given  Plan Discussed with: CRNA and Anesthesiologist  Anesthesia Plan Comments:        Anesthesia Quick Evaluation

## 2019-02-11 NOTE — Anesthesia Procedure Notes (Addendum)
Procedure Name: Intubation Date/Time: 02/11/2019 10:06 AM Performed by: Amadeo Garnet, CRNA Pre-anesthesia Checklist: Patient identified, Emergency Drugs available, Suction available and Patient being monitored Patient Re-evaluated:Patient Re-evaluated prior to induction Oxygen Delivery Method: Circle system utilized Preoxygenation: Pre-oxygenation with 100% oxygen Induction Type: IV induction Ventilation: Mask ventilation without difficulty Laryngoscope Size: Mac and 3 Grade View: Grade I Tube type: Oral Tube size: 7.5 mm Number of attempts: 1 Airway Equipment and Method: Stylet and Oral airway Placement Confirmation: ETT inserted through vocal cords under direct vision,  positive ETCO2 and breath sounds checked- equal and bilateral Secured at: 21 cm Tube secured with: Tape Dental Injury: Teeth and Oropharynx as per pre-operative assessment  Comments: Inserted by Paulina Fusi, SRNA

## 2019-02-12 ENCOUNTER — Telehealth: Payer: Self-pay | Admitting: Internal Medicine

## 2019-02-12 NOTE — Telephone Encounter (Signed)
lmtcb

## 2019-02-12 NOTE — Telephone Encounter (Signed)
Pt returns call. Pt reports redness, states it isn't significant, but her d/c paperwork said to call the office if she noticed redness. Denies redness is warm to touch, fever. Pt advised to continue to monitor at this time and what to monitor for (including "lump" formation at groin site and what size to be concerned with). Patient verbalized understanding and agreeable to plan.

## 2019-02-12 NOTE — Telephone Encounter (Signed)
New Message    Pts daughter is calling and states the pt is having some swelling and redness near wound site. Pt says she is having some  pain  Pts daughter says the bandage is still on, but just around it is swelling and red    Please call

## 2019-02-16 LAB — POCT ACTIVATED CLOTTING TIME: Activated Clotting Time: 301 seconds

## 2019-02-19 ENCOUNTER — Telehealth: Payer: Self-pay | Admitting: Internal Medicine

## 2019-02-19 NOTE — Telephone Encounter (Signed)
New message:      Pt said she had an Ablation last Thursday. She said she have some questions please.

## 2019-02-19 NOTE — Telephone Encounter (Signed)
Followed up pt. She apologizes b/c she just responded to [employer] from nurse. She reports noticing grape size lump in groin area.  States she had not checked before today and didn't know if she should be concerned. She reports bruising improving. Denies pain/oozing/redness at groin site. Pt advised to continue to monitor and to call us back if it gets any bigger, but explained this is not abnormal. Patient verbalized understanding and agreeable to plan.

## 2019-03-04 ENCOUNTER — Ambulatory Visit: Payer: PPO | Attending: Internal Medicine

## 2019-03-04 DIAGNOSIS — Z23 Encounter for immunization: Secondary | ICD-10-CM | POA: Insufficient documentation

## 2019-03-04 NOTE — Progress Notes (Signed)
   Covid-19 Vaccination [facility]  Name:  Anna Marquez    MRN: [id number] DOB: 1950-12-26  03/04/2019  [name] was observed post Covid-19 immunization for 15 minutes without incidence. She was provided with Vaccine Information Sheet and instruction to access the V-Safe system.   [name] was instructed to call 911 with any severe reactions post vaccine: [name] Difficulty breathing  . Swelling of your face and throat  . A fast heartbeat  . A bad rash all over your body  . Dizziness and weakness    Immunizations Administered    Name Date Dose VIS Date Route   Pfizer COVID-19 Vaccine 03/04/2019  8:33 AM 0.3 mL 12/18/2018 Intramuscular   Manufacturer: [place]   Lot: [id number]   [place]: [id number]

## 2019-03-11 ENCOUNTER — Encounter (HOSPITAL_COMMUNITY): Payer: Self-pay | Admitting: Nurse Practitioner

## 2019-03-11 ENCOUNTER — Ambulatory Visit (HOSPITAL_COMMUNITY)
Admission: RE | Admit: 2019-03-11 | Discharge: 2019-03-11 | Disposition: A | Discharge status: Home / Self Care | Payer: PPO | Source: Ambulatory Visit | Attending: Nurse Practitioner | Admitting: Nurse Practitioner

## 2019-03-11 ENCOUNTER — Other Ambulatory Visit: Payer: Self-pay

## 2019-03-11 VITALS — BP 156/100 | HR 76 | Ht 64.0 in | Wt 172.4 lb

## 2019-03-11 DIAGNOSIS — I340 Nonrheumatic mitral (valve) insufficiency: Secondary | ICD-10-CM | POA: Insufficient documentation

## 2019-03-11 DIAGNOSIS — I4819 Other persistent atrial fibrillation: Secondary | ICD-10-CM | POA: Diagnosis not present

## 2019-03-11 DIAGNOSIS — Z7901 Long term (current) use of anticoagulants: Secondary | ICD-10-CM | POA: Diagnosis not present

## 2019-03-11 DIAGNOSIS — I4892 Unspecified atrial flutter: Secondary | ICD-10-CM | POA: Insufficient documentation

## 2019-03-11 DIAGNOSIS — K219 Gastro-esophageal reflux disease without esophagitis: Secondary | ICD-10-CM | POA: Diagnosis not present

## 2019-03-11 DIAGNOSIS — D6869 Other thrombophilia: Secondary | ICD-10-CM | POA: Diagnosis not present

## 2019-03-11 DIAGNOSIS — Z79899 Other long term (current) drug therapy: Secondary | ICD-10-CM | POA: Diagnosis not present

## 2019-03-11 DIAGNOSIS — I10 Essential (primary) hypertension: Secondary | ICD-10-CM | POA: Insufficient documentation

## 2019-03-11 NOTE — Progress Notes (Signed)
Primary Care Physician: [name], DO Referring Physician: ER f/u Cardiologist: Dr. [name] is a 69 y.o. female with a h/o paroxysmal afib first dx March 2018. She was originally  seen  in the afib office for having afib with RVR  in the ER,  and receiving successful cardioversion 11/05/18. Unfortunately, she was back in rapid afib at 141 bpm.   She had an echo and a ETT in 2018. EKG in rhythm in 2018 showed SR with a first degree av block.  In the past she would take an extra corgard to get back in rhythm. No outstanding triggers.   Return to afib clinic 12/16/18.  Repeat Echo showed reduced EF at 45-50% and Zio patch showed continuous afib with  HR control  at 96 bpm, max at 218 ms, minimum at 54 bpm.  Discussed antiarrythmic's with pt. With the reduced EF and evidence of first degree AV block on a prior EKG in rhythm, it would be by guidelines to avoid Multaq  and flecainide. Discussed with pt use of  tikosyn and she wants to pursue.   Back in afib, clinic for Tikosyn admit, 12/21/18. She states that her afib has been running over 100 bpm for the last   several days. It is making her more fatigued.  In the clinic v rates in the 120's. She  is getting more symptomatic with it. Her weight is stable.   F/u in afib clinic, 01/04/19.  She is in afib this am but has felt so much better and was surprised  that she was out of rhythm. She went in and out while in the hospital  but did break with Tikosyn. Did not require DCCV. The dose had to be reduced to 250 mcg  because the qtc did prolong. Continues  on eliquis with a CHA2DS2VASc score of 3.  F/u in afib clinic, 01/11/19. EKG shows afib. Her records at home indicate that her HR's have been elevated so I suspect that she has been in afib since I last saw her last week. The shortness of breath improved after hospitalization for Tikosyn but now  has  returned.  Review os her TSH over time shows low TSH readings but T4 when rechecked in  November  was in normal range.   F/u in afib clinic, 01/22/19. Had successful cardioversion but yesterday went back out of rhythm. She is feeling better right now than right before DCCV.EKG shows atrial flutter at 107 bpm. She  is interested in having afib ablation. She has an appointment with Dr. Rayann Heman to further discuss on 1/28.Contiues on Tikosyn and eliquis with CHA2DS2VASc score of 3.   F/u in afib clinic, 03/11/19. She is now one month qfib ablation.. She has been staying in SR on dofetilide. She had been doing great unitl she got her covid shot one week ago. Since this she has had elevated BP and H/A. No afib . No swallowing or groin issues.   Today, she denies symptoms of palpitations, chest pain, shortness of breath, orthopnea, PND, lower extremity edema, dizziness, presyncope, syncope, or neurologic sequela. The patient is tolerating medications without difficulties and is otherwise without complaint today.   Past Medical History:  Diagnosis Date  . Adenomatous colon polyp 2003  . Cholelithiasis   . Diverticulosis of colon   . Gastric polyps   . GERD (gastroesophageal reflux disease)   . Helicobacter pylori (H. pylori)   . Hiatal hernia   . Hypertension   .  Mitral valve prolapse   . Nephrolithiasis   . Osteoarthritis   . Osteopenia 03/2017   T score -1.5 FRAX 8.9% / 0.9%.  Stable from prior DEXA 2016  . Persistent atrial fibrillation ([place])   . TGA (transient global amnesia)    Past Surgical History:  Procedure Laterality Date  . ANTERIOR AND POSTERIOR REPAIR N/A 06/14/2014   Procedure: ANTERIOR (CYSTOCELE) AND POSTERIOR REPAIR (RECTOCELE);  Surgeon: Anastasio Auerbach, MD;  Location: [facility];  Service: Gynecology;  Laterality: N/A;  . ATRIAL FIBRILLATION ABLATION N/A 02/11/2019   Procedure: ATRIAL FIBRILLATION ABLATION;  Surgeon: Thompson Grayer, MD;  Location: [facility];  Service: Cardiovascular;  Laterality: N/A;  . CARDIOVERSION N/A 01/15/2019   Procedure: CARDIOVERSION;   Surgeon: Fay Records, MD;  Location: [facility];  Service: Cardiovascular;  Laterality: N/A;  . CHOLECYSTECTOMY    . kidney stone removal     x2  . VAGINAL HYSTERECTOMY N/A 06/14/2014   Procedure: HYSTERECTOMY VAGINAL;  Surgeon: Anastasio Auerbach, MD;  Location: [facility];  Service: Gynecology;  Laterality: N/A;    Current Outpatient Medications  Medication Sig Dispense Refill  . acetaminophen (TYLENOL) 500 MG tablet Take 1,000 mg by mouth every 6 (six) hours as needed for moderate pain or headache.    . albuterol (VENTOLIN HFA) 108 (90 Base) MCG/ACT inhaler Inhale 1-2 puffs into the lungs every 6 (six) hours as needed for wheezing or shortness of breath. (Patient taking differently: Inhale 2 puffs into the lungs every 6 (six) hours as needed for wheezing or shortness of breath. ) 18 g 3  . Ascorbic Acid (VITAMIN C) 1000 MG tablet Take 1,000 mg by mouth daily.    . Cholecalciferol (VITAMIN D) 50 MCG (2000 UT) tablet Take 2,000 Units by mouth daily.    [name] diltiazem (CARDIZEM) 30 MG tablet Take 1 Tablet Every 4 Hours As Needed For HR <100 (Patient taking differently: Take 30 mg by mouth every 4 (four) hours as needed (As Needed For HR <100). ) 45 tablet 1  . dofetilide (TIKOSYN) 250 MCG capsule Take 1 capsule (250 mcg total) by mouth 2 (two) times daily. 180 capsule 3  . ELIQUIS 5 MG TABS tablet TAKE 1 TABLET BY MOUTH 2 TIMES DAILY. (Patient taking differently: Take 5 mg by mouth 2 (two) times daily. ) 60 tablet 10  . ferrous sulfate 325 (65 FE) MG tablet Take 325 mg by mouth every other day.    . fluticasone (FLONASE) 50 MCG/ACT nasal spray Place 1 spray into both nostrils as needed for allergies or rhinitis. 16 g 6  . furosemide (LASIX) 20 MG tablet Take 1 tablet daily for the next 3 days then as needed for weight gain/swelling (Patient taking differently: Take 20 mg by mouth daily as needed for edema. ) 15 tablet 1  . lisinopril (ZESTRIL) 10 MG tablet Take 1 tablet (10 mg total) by mouth daily.  100 tablet 3  . loratadine (CLARITIN) 10 MG tablet Take 10 mg by mouth daily.     . Magnesium 250 MG TABS Take 1 tablet (250 mg total) by mouth daily.  0  . montelukast (SINGULAIR) 10 MG tablet Take 1 tablet (10 mg total) by mouth at bedtime. 30 tablet 3  . nadolol (CORGARD) 40 MG tablet Take 1.5 tablets (60 mg total) by mouth 2 (two) times daily. 270 tablet 3  . pantoprazole (PROTONIX) 40 MG tablet Take 1 tablet (40 mg total) by mouth daily. 90 tablet 4  .  potassium chloride (K-DUR) 10 MEQ tablet Take 1 tablet (10 mEq total) by mouth daily as needed (take with HCTZ). (Patient taking differently: Take 10 mEq by mouth daily as needed (take with lasix). ) 30 tablet 6  . Probiotic Product (PROBIOTIC PO) Take 1 tablet by mouth daily.    . vitamin E 180 MG (400 UNITS) capsule Take 400 Units by mouth daily.     No current facility-administered medications for this encounter.    Allergies  Allergen Reactions  . Erythromycin     Upset stomach  . Nsaids Other (See Comments)    Has a history of bleeding ulcers, tolerates aspirin   . Penicillins Hives and Shortness Of Breath    Did it involve swelling of the face/tongue/throat, SOB, or low BP? Yes Did it involve sudden or severe rash/hives, skin peeling, or any reaction on the inside of your mouth or nose? No Did you need to seek medical attention at a hospital or doctor's office? No When did it last happen?45 years If all above answers are "NO", may proceed with cephalosporin use.     Social History   Socioeconomic History  . Marital status: Divorced    Spouse name: Not on file  . Number of children: Not on file  . Years of education: Not on file  . Highest education level: Not on file  Occupational History  . Not on file  Tobacco Use  . Smoking status: Never Smoker  . Smokeless tobacco: Never Used  Substance and Sexual Activity  . Alcohol use: Yes    Alcohol/week: 0.0 standard drinks    Comment: Occas  . Drug use: No  .  Sexual activity: Yes    Birth control/protection: Post-menopausal    Comment: 1st intercourse 69 yo-Fewer than 5 partners  Other Topics Concern  . Not on file  Social History Narrative  . Not on file   Social Determinants of Health   Financial Resource Strain:   . Difficulty of Paying Living Expenses: Not on file  Food Insecurity:   . Worried About Charity fundraiser in the Last Year: Not on file  . Ran Out of Food in the Last Year: Not on file  Transportation Needs:   . Lack of Transportation (Medical): Not on file  . Lack of Transportation (Non-Medical): Not on file  Physical Activity:   . Days of Exercise per Week: Not on file  . Minutes of Exercise per Session: Not on file  Stress:   . Feeling of Stress : Not on file  Social Connections:   . Frequency of Communication with Friends and Family: Not on file  . Frequency of Social Gatherings with Friends and Family: Not on file  . Attends Religious Services: Not on file  . Active Member of Clubs or Organizations: Not on file  . Attends Archivist Meetings: Not on file  . Marital Status: Not on file  Intimate Partner Violence:   . Fear of Current or Ex-Partner: Not on file  . Emotionally Abused: Not on file  . Physically Abused: Not on file  . Sexually Abused: Not on file    Family History  Problem Relation Age of Onset  . Cancer Father        prostate  . Breast cancer Paternal Aunt 56  . Diabetes Maternal Grandmother   . Heart disease Maternal Grandmother   . Heart disease Maternal Grandfather   . Melanoma Paternal Aunt   . Neuropathy Mother   .  Congestive Heart Failure Mother     ROS- All systems are reviewed and negative except as per the HPI above  Physical Exam: Vitals:   03/11/19 1152  BP: (!) 156/100  Pulse: 76  Weight: 78.2 kg  Height: 5\' 4"  (1.626 m)   Wt Readings from Last 3 Encounters:  03/11/19 78.2 kg  02/11/19 75.8 kg  02/04/19 77.3 kg    Labs: Lab Results  Component Value  Date   NA 142 02/04/2019   K 3.6 02/04/2019   CL 104 02/04/2019   CO2 21 02/04/2019   GLUCOSE 90 02/04/2019   BUN 21 02/04/2019   CREATININE 1.01 (H) 02/04/2019   CALCIUM 9.4 02/04/2019   MG 2.0 01/04/2019   Lab Results  Component Value Date   INR 1.1 12/25/2018   Lab Results  Component Value Date   CHOL 184 11/04/2018   HDL 50.60 11/04/2018   LDLCALC 103 (H) 10/28/2017   TRIG 259.0 (H) 11/04/2018     GEN- The patient is well appearing, alert and oriented x 3 today.   Head- normocephalic, atraumatic Eyes-  Sclera clear, conjunctiva pink Ears- hearing intact Oropharynx- clear Neck- supple, no JVP Lymph- no cervical lymphadenopathy Lungs- Clear to ausculation bilaterally, normal work of breathing Heart- regular  rate and rhythm, no murmurs, rubs or gallops, PMI not laterally displaced GI- soft, NT, ND, + BS Extremities- no clubbing, cyanosis, or edema MS- no significant deformity or atrophy Skin- no rash or lesion Psych- euthymic mood, full affect Neuro- strength and sensation are intact  EKG- NSR at 76 bpm, pr int  186 ms, qrs int 82 ms, qtc 463 ms  EchoStudy Conclusions-2020 1. Left ventricular ejection fraction, by visual estimation, is 45 to 50%. The left ventricle has normal function. There is no left ventricular hypertrophy. 2. Mild global hypokinesis. 3. Left ventricular diastolic function could not be evaluated. 4. Global right ventricle has normal systolic function.The right ventricular size is normal. No increase in right ventricular wall thickness. 5. Left atrial size was normal. 6. Right atrial size was normal. 7. The mitral valve is abnormal. Mild to moderate mitral valve regurgitation. 8. The tricuspid valve is grossly normal. Tricuspid valve regurgitation is trivial. 9. The aortic valve is tricuspid. Aortic valve regurgitation is not visualized. 10. The pulmonic valve was grossly normal. Pulmonic valve regurgitation is not visualized. 11. Normal  pulmonary artery systolic pressure. 12. The inferior vena cava is normal in size with greater than 50% respiratory variability, suggesting right atrial pressure of 3 mmHg. Left Ventricle: Left ventricular ejection fraction, by visual estimation, is 45 to 50%. The left ventricle    Assessment and Plan: 1. Persistent  Afib/flutter   S/p tikosyn loading and is on 250 mcg  dofetilide with stable qtc but had not been successful to keep her in rhythm  Successful cardioversion, 1/8, ERAF Now s/p ablation 02/11/19 and staying in SR Continue tikosyn 250 mcg bid  Continue nadolol  2. CHA2DS2VASc score of at least 3 Continue eliquis 5 mg bid   3. H/A, elevated BP since Covid shot one week ago Continue to watch BP at home for the next week If stays elevated will plan on increasing lisinopril She states her BP was well controlled before covid shot   F/u with Dr. Rayann Heman  5/4   Geroge Baseman. Rhyleigh Grassel, [facility] [address] [place], [place] [address] [phone]

## 2019-03-13 ENCOUNTER — Other Ambulatory Visit: Payer: Self-pay | Admitting: Family Medicine

## 2019-03-24 ENCOUNTER — Ambulatory Visit: Payer: PPO | Attending: Internal Medicine

## 2019-03-24 DIAGNOSIS — Z23 Encounter for immunization: Secondary | ICD-10-CM

## 2019-03-24 NOTE — Progress Notes (Signed)
   Covid-19 Vaccination Clinic  Name:  Anna Marquez    MRN: [id number] DOB: February 27, 1950  03/24/2019  [name] was observed post Covid-19 immunization for 30 minutes based on pre-vaccination screening without incident. She was provided with Vaccine Information Sheet and instruction to access the V-Safe system.   [name] was instructed to call 911 with any severe reactions post vaccine: [name] Difficulty breathing  . Swelling of face and throat  . A fast heartbeat  . A bad rash all over body  . Dizziness and weakness   Immunizations Administered    Name Date Dose VIS Date Route   Pfizer COVID-19 Vaccine 03/24/2019 11:08 AM 0.3 mL 12/18/2018 Intramuscular   Manufacturer: [employer]   Lot: [id number]   [place]: [id number]

## 2019-04-13 ENCOUNTER — Other Ambulatory Visit (HOSPITAL_COMMUNITY): Payer: Self-pay | Admitting: Nurse Practitioner

## 2019-05-12 ENCOUNTER — Encounter: Payer: Self-pay | Admitting: Internal Medicine

## 2019-05-12 ENCOUNTER — Other Ambulatory Visit: Payer: Self-pay

## 2019-05-12 ENCOUNTER — Ambulatory Visit: Payer: PPO | Admitting: Internal Medicine

## 2019-05-12 VITALS — BP 140/82 | HR 76 | Ht 64.0 in | Wt 174.0 lb

## 2019-05-12 DIAGNOSIS — I4819 Other persistent atrial fibrillation: Secondary | ICD-10-CM

## 2019-05-12 DIAGNOSIS — I10 Essential (primary) hypertension: Secondary | ICD-10-CM | POA: Diagnosis not present

## 2019-05-12 NOTE — Progress Notes (Signed)
PCP: [name], DO Primary Cardiologist: Dr [name] is a 69 y.o. female who presents today for routine electrophysiology followup.  Since his recent afib ablation, the patient reports doing very well.  she denies procedure related complications and is pleased with the results of the procedure.  She thinks she did have some ERAF around the time of her COVID vaccine.   Today, she denies symptoms of palpitations, chest pain, shortness of breath,  lower extremity edema, dizziness, presyncope, or syncope.  The patient is otherwise without complaint today.   Past Medical History:  Diagnosis Date  . Adenomatous colon polyp 2003  . Cholelithiasis   . Diverticulosis of colon   . Gastric polyps   . GERD (gastroesophageal reflux disease)   . Helicobacter pylori (H. pylori)   . Hiatal hernia   . Hypertension   . Mitral valve prolapse   . Nephrolithiasis   . Osteoarthritis   . Osteopenia 03/2017   T score -1.5 FRAX 8.9% / 0.9%.  Stable from prior DEXA 2016  . Persistent atrial fibrillation ([place])   . TGA (transient global amnesia)    Past Surgical History:  Procedure Laterality Date  . ANTERIOR AND POSTERIOR REPAIR N/A 06/14/2014   Procedure: ANTERIOR (CYSTOCELE) AND POSTERIOR REPAIR (RECTOCELE);  Surgeon: Anastasio Auerbach, MD;  Location: [facility];  Service: Gynecology;  Laterality: N/A;  . ATRIAL FIBRILLATION ABLATION N/A 02/11/2019   Procedure: ATRIAL FIBRILLATION ABLATION;  Surgeon: Thompson Grayer, MD;  Location: [facility];  Service: Cardiovascular;  Laterality: N/A;  . CARDIOVERSION N/A 01/15/2019   Procedure: CARDIOVERSION;  Surgeon: Fay Records, MD;  Location: [place];  Service: Cardiovascular;  Laterality: N/A;  . CHOLECYSTECTOMY    . kidney stone removal     x2  . VAGINAL HYSTERECTOMY N/A 06/14/2014   Procedure: HYSTERECTOMY VAGINAL;  Surgeon: Anastasio Auerbach, MD;  Location: [facility];  Service: Gynecology;  Laterality: N/A;    ROS- all systems are  personally reviewed and negatives except as per HPI above  Current Outpatient Medications  Medication Sig Dispense Refill  . acetaminophen (TYLENOL) 500 MG tablet Take 1,000 mg by mouth every 6 (six) hours as needed for moderate pain or headache.    . albuterol (VENTOLIN HFA) 108 (90 Base) MCG/ACT inhaler Inhale 1-2 puffs into the lungs every 6 (six) hours as needed for wheezing or shortness of breath. 18 g 3  . Ascorbic Acid (VITAMIN C) 1000 MG tablet Take 1,000 mg by mouth daily.    . Cholecalciferol (VITAMIN D) 50 MCG (2000 UT) tablet Take 2,000 Units by mouth daily.    [name] diltiazem (CARDIZEM) 30 MG tablet Take 1 Tablet Every 4 Hours As Needed For HR <100 45 tablet 1  . dofetilide (TIKOSYN) 250 MCG capsule Take 1 capsule (250 mcg total) by mouth 2 (two) times daily. 180 capsule 3  . ELIQUIS 5 MG TABS tablet TAKE 1 TABLET BY MOUTH 2 TIMES DAILY. 60 tablet 10  . ferrous sulfate 325 (65 FE) MG tablet Take 325 mg by mouth every other day.    . fluticasone (FLONASE) 50 MCG/ACT nasal spray Place 1 spray into both nostrils as needed for allergies or rhinitis. 16 g 6  . furosemide (LASIX) 20 MG tablet Take 1 tablet (20 mg total) by mouth daily as needed for edema. 15 tablet 1  . lisinopril (ZESTRIL) 10 MG tablet Take 1 tablet (10 mg total) by mouth daily. 100 tablet 3  . loratadine (CLARITIN)  10 MG tablet Take 10 mg by mouth daily.     . Magnesium 250 MG TABS Take 1 tablet (250 mg total) by mouth daily.  0  . montelukast (SINGULAIR) 10 MG tablet Take 1qd(Plz sched with a new provider) 30 tablet 3  . nadolol (CORGARD) 40 MG tablet Take 1.5 tablets (60 mg total) by mouth 2 (two) times daily. 270 tablet 3  . pantoprazole (PROTONIX) 40 MG tablet Take 1 tablet (40 mg total) by mouth daily. 90 tablet 4  . potassium chloride (K-DUR) 10 MEQ tablet Take 1 tablet (10 mEq total) by mouth daily as needed (take with HCTZ). 30 tablet 6  . Probiotic Product (PROBIOTIC PO) Take 1 tablet by mouth daily.    . vitamin  E 180 MG (400 UNITS) capsule Take 400 Units by mouth daily.     No current facility-administered medications for this visit.    Physical Exam: Vitals:   05/12/19 1151  BP: 140/82  Pulse: 76  SpO2: 94%  Weight: 174 lb (78.9 kg)  Height: 5\' 4"  (1.626 m)    GEN- The patient is well appearing, alert and oriented x 3 today.   Head- normocephalic, atraumatic Eyes-  Sclera clear, conjunctiva pink Ears- hearing intact Oropharynx- clear Lungs-  normal work of breathing Heart- Regular rate and rhythm  GI- soft  Extremities- no clubbing, cyanosis, or edema  EKG tracing ordered today is personally reviewed and shows sinus  Assessment and Plan:  1. Persistent atrial fibrillation/ atrial flutter Doing well s/p ablation Consider stopping tikosyn if no arrhythmias on return chads2vasc score is at least 3.  Continue eliquis  2. HTN Stable No change required today  Return to see me in 3 months  Thompson Grayer MD, [facility] 05/12/2019 12:03 PM

## 2019-05-12 NOTE — Patient Instructions (Signed)
Medication Instructions:  Your physician recommends that you continue on your current medications as directed. Please refer to the Current Medication list given to you today.  Labwork: None ordered.  Testing/Procedures: Your physician has requested that you have an echocardiogram. Echocardiography is a painless test that uses sound waves to create images of your heart. It provides your doctor with information about the size and shape of your heart and how well your heart's chambers and valves are working. This procedure takes approximately one hour. There are no restrictions for this procedure.  Please schedule for ECHO in 3 months.    Follow-Up: Your physician wants you to follow-up in: 3 months with Dr. Rayann Heman after ECHO.   Any Other Special Instructions Will Be Listed Below (If Applicable).  If you need a refill on your cardiac medications before your next appointment, please call your pharmacy.

## 2019-07-06 ENCOUNTER — Encounter: Payer: PPO | Admitting: Obstetrics and Gynecology

## 2019-07-06 ENCOUNTER — Other Ambulatory Visit: Payer: Self-pay

## 2019-07-06 ENCOUNTER — Encounter: Payer: PPO | Admitting: Gynecology

## 2019-07-07 ENCOUNTER — Ambulatory Visit (INDEPENDENT_AMBULATORY_CARE_PROVIDER_SITE_OTHER): Payer: PPO | Admitting: Obstetrics and Gynecology

## 2019-07-07 ENCOUNTER — Encounter: Payer: Self-pay | Admitting: Obstetrics and Gynecology

## 2019-07-07 VITALS — BP 140/90 | Ht 64.0 in | Wt 168.0 lb

## 2019-07-07 DIAGNOSIS — M8580 Other specified disorders of bone density and structure, unspecified site: Secondary | ICD-10-CM

## 2019-07-07 DIAGNOSIS — Z01419 Encounter for gynecological examination (general) (routine) without abnormal findings: Secondary | ICD-10-CM

## 2019-07-07 NOTE — Progress Notes (Signed)
DAYANA DALPORTO [birth year] [id number]  SUBJECTIVE:  69 y.o. G2P2 female here for an annual routine gynecologic exam. She has no gynecologic concerns. Had a cardiac ablation.  Current Outpatient Medications  Medication Sig Dispense Refill  . acetaminophen (TYLENOL) 500 MG tablet Take 1,000 mg by mouth every 6 (six) hours as needed for moderate pain or headache.    . albuterol (VENTOLIN HFA) 108 (90 Base) MCG/ACT inhaler Inhale 1-2 puffs into the lungs every 6 (six) hours as needed for wheezing or shortness of breath. 18 g 3  . Ascorbic Acid (VITAMIN C) 1000 MG tablet Take 1,000 mg by mouth daily.    . Cholecalciferol (VITAMIN D) 50 MCG (2000 UT) tablet Take 2,000 Units by mouth daily.    [name] dofetilide (TIKOSYN) 250 MCG capsule Take 1 capsule (250 mcg total) by mouth 2 (two) times daily. 180 capsule 3  . ELIQUIS 5 MG TABS tablet TAKE 1 TABLET BY MOUTH 2 TIMES DAILY. 60 tablet 10  . ferrous sulfate 325 (65 FE) MG tablet Take 325 mg by mouth every other day.    . fluticasone (FLONASE) 50 MCG/ACT nasal spray Place 1 spray into both nostrils as needed for allergies or rhinitis. 16 g 6  . furosemide (LASIX) 20 MG tablet Take 1 tablet (20 mg total) by mouth daily as needed for edema. 15 tablet 1  . lisinopril (ZESTRIL) 10 MG tablet Take 1 tablet (10 mg total) by mouth daily. 100 tablet 3  . loratadine (CLARITIN) 10 MG tablet Take 10 mg by mouth daily.     . Magnesium 250 MG TABS Take 1 tablet (250 mg total) by mouth daily.  0  . montelukast (SINGULAIR) 10 MG tablet Take 1qd(Plz sched with a new provider) 30 tablet 3  . nadolol (CORGARD) 40 MG tablet Take 1.5 tablets (60 mg total) by mouth 2 (two) times daily. 270 tablet 3  . pantoprazole (PROTONIX) 40 MG tablet Take 1 tablet (40 mg total) by mouth daily. 90 tablet 4  . potassium chloride (K-DUR) 10 MEQ tablet Take 1 tablet (10 mEq total) by mouth daily as needed (take with HCTZ). 30 tablet 6  . Probiotic Product (PROBIOTIC PO) Take 1 tablet by mouth  daily.    . vitamin E 180 MG (400 UNITS) capsule Take 400 Units by mouth daily.    [name] diltiazem (CARDIZEM) 30 MG tablet Take 1 Tablet Every 4 Hours As Needed For HR <100 (Patient not taking: Reported on 07/07/2019) 45 tablet 1   No current facility-administered medications for this visit.   Allergies: Erythromycin, Nsaids, and Penicillins  No LMP recorded. Patient has had a hysterectomy.  Past medical history,surgical history, problem list, medications, allergies, family history and social history were all reviewed and documented as reviewed in the EPIC chart.  ROS:  Feeling well. No dyspnea or chest pain on exertion.  No abdominal pain, change in bowel habits, black or bloody stools.  No urinary tract symptoms. GYN ROS:  no abnormal bleeding, pelvic pain or discharge, no breast pain or new or enlarging lumps on self exam. No neurological complaints.   OBJECTIVE:  BP 140/90   Ht 5\' 4"  (1.626 m)   Wt 168 lb (76.2 kg)   BMI 28.84 kg/m  The patient appears well, alert, oriented x 3, in no distress. ENT normal.  Neck supple. No cervical or supraclavicular adenopathy or thyromegaly.  Lungs are clear, good air entry, no wheezes, rhonchi or rales. S1 and S2 normal, no murmurs, regular rate  and rhythm.  Abdomen soft without tenderness, guarding, mass or organomegaly.  Neurological is normal, no focal findings.  BREAST EXAM: breasts appear normal, no suspicious masses, no skin or nipple changes or axillary nodes  PELVIC EXAM: VULVA: normal appearing vulva with no masses, tenderness or lesions, VAGINA: normal appearing vagina with normal color and discharge, no lesions, CERVIX: surgically absent, UTERUS: surgically absent, vaginal cuff normal, ADNEXA: no masses, non tender  Chaperone: [name] present during the examination  ASSESSMENT:  69 y.o. G2P2 here for annual gynecologic exam  PLAN:   1. Postmenopausal.  No significant menopausal symptoms.  No vaginal bleeding.  Prior [facility] with  anterior and posterior repair in 2016.   2. Pap smear 01/2017.  No significant history of abnormal Pap smears.  Option to discontinue Pap smears will be reviewed at next interval where it would be due next year. 3. Mammogram 02/2018.  Normal breast exam today.  She is reminded to schedule an annual mammogram this year as this is overdue.  She was told to hold off after getting her COVID-19 vaccination so she will plan to do the mammogram later this year. 4. Colonoscopy 2015.  Recommended that she follow up at the recommended interval. 5. DEXA 03/2017.  T score -1.5 FRAX 8.9% / 0.9%.  This was stable from her DEXA in 2016.  Next DEXA recommended this year so she plans to schedule this soon.  She was taken off most supplements including vitamin D and calcium with her cardiac condition.  We discussed calcium rich foods in the diet and maintaining regular weightbearing exercise. 6. Health maintenance.  No labs today as she normally has these completed elsewhere.  Return annually or sooner, prn.  Joseph Pierini MD 07/07/19

## 2019-07-14 ENCOUNTER — Other Ambulatory Visit: Payer: Self-pay | Admitting: Family Medicine

## 2019-07-14 MED ORDER — LISINOPRIL 20 MG PO TABS: 20.0000 mg | ORAL_TABLET | Freq: Every day | ORAL | 3 refills | Status: DC

## 2019-07-14 NOTE — Telephone Encounter (Signed)
Last OV 01/26/19 Last fill 03/15/19  #30/3

## 2019-07-16 ENCOUNTER — Other Ambulatory Visit (HOSPITAL_BASED_OUTPATIENT_CLINIC_OR_DEPARTMENT_OTHER): Payer: Self-pay | Admitting: Family Medicine

## 2019-07-16 DIAGNOSIS — Z1231 Encounter for screening mammogram for malignant neoplasm of breast: Secondary | ICD-10-CM

## 2019-07-21 ENCOUNTER — Other Ambulatory Visit: Payer: Self-pay

## 2019-07-21 ENCOUNTER — Ambulatory Visit (HOSPITAL_BASED_OUTPATIENT_CLINIC_OR_DEPARTMENT_OTHER)
Admission: RE | Admit: 2019-07-21 | Discharge: 2019-07-21 | Disposition: A | Discharge status: Home / Self Care | Payer: PPO | Source: Ambulatory Visit | Attending: Family Medicine | Admitting: Family Medicine

## 2019-07-21 DIAGNOSIS — Z1231 Encounter for screening mammogram for malignant neoplasm of breast: Secondary | ICD-10-CM | POA: Diagnosis not present

## 2019-07-26 ENCOUNTER — Emergency Department (HOSPITAL_COMMUNITY)
Admission: EM | Admit: 2019-07-26 | Discharge: 2019-07-26 | Disposition: A | Discharge status: Home / Self Care | Payer: PPO | Attending: Emergency Medicine | Admitting: Emergency Medicine

## 2019-07-26 ENCOUNTER — Emergency Department (HOSPITAL_COMMUNITY): Payer: PPO

## 2019-07-26 ENCOUNTER — Ambulatory Visit: Payer: PPO | Admitting: Cardiovascular Disease

## 2019-07-26 ENCOUNTER — Encounter (HOSPITAL_COMMUNITY): Payer: Self-pay | Admitting: Emergency Medicine

## 2019-07-26 ENCOUNTER — Other Ambulatory Visit: Payer: Self-pay

## 2019-07-26 DIAGNOSIS — I10 Essential (primary) hypertension: Secondary | ICD-10-CM | POA: Diagnosis not present

## 2019-07-26 DIAGNOSIS — Z7901 Long term (current) use of anticoagulants: Secondary | ICD-10-CM | POA: Insufficient documentation

## 2019-07-26 DIAGNOSIS — R319 Hematuria, unspecified: Secondary | ICD-10-CM | POA: Diagnosis not present

## 2019-07-26 DIAGNOSIS — K5730 Diverticulosis of large intestine without perforation or abscess without bleeding: Secondary | ICD-10-CM | POA: Diagnosis not present

## 2019-07-26 DIAGNOSIS — R109 Unspecified abdominal pain: Secondary | ICD-10-CM

## 2019-07-26 DIAGNOSIS — I700 Atherosclerosis of aorta: Secondary | ICD-10-CM | POA: Diagnosis not present

## 2019-07-26 DIAGNOSIS — R300 Dysuria: Secondary | ICD-10-CM | POA: Diagnosis not present

## 2019-07-26 DIAGNOSIS — R112 Nausea with vomiting, unspecified: Secondary | ICD-10-CM | POA: Insufficient documentation

## 2019-07-26 DIAGNOSIS — Z79899 Other long term (current) drug therapy: Secondary | ICD-10-CM | POA: Diagnosis not present

## 2019-07-26 DIAGNOSIS — K449 Diaphragmatic hernia without obstruction or gangrene: Secondary | ICD-10-CM | POA: Diagnosis not present

## 2019-07-26 DIAGNOSIS — N135 Crossing vessel and stricture of ureter without hydronephrosis: Secondary | ICD-10-CM | POA: Diagnosis not present

## 2019-07-26 LAB — CBC
HCT: 37.2 % (ref 36.0–46.0)
Hemoglobin: 13 g/dL (ref 12.0–15.0)
MCH: 30.7 pg (ref 26.0–34.0)
MCHC: 34.9 g/dL (ref 30.0–36.0)
MCV: 87.9 fL (ref 80.0–100.0)
Platelets: 206 10*3/uL (ref 150–400)
RBC: 4.23 MIL/uL (ref 3.87–5.11)
RDW: 12.3 % (ref 11.5–15.5)
WBC: 8.4 10*3/uL (ref 4.0–10.5)
nRBC: 0 % (ref 0.0–0.2)

## 2019-07-26 LAB — HEPATIC FUNCTION PANEL
ALT: 15 U/L (ref 0–44)
AST: 19 U/L (ref 15–41)
Albumin: 4.5 g/dL (ref 3.5–5.0)
Alkaline Phosphatase: 52 U/L (ref 38–126)
Bilirubin, Direct: 0.2 mg/dL (ref 0.0–0.2)
Indirect Bilirubin: 1.9 mg/dL — ABNORMAL HIGH (ref 0.3–0.9)
Total Bilirubin: 2.1 mg/dL — ABNORMAL HIGH (ref 0.3–1.2)
Total Protein: 7.8 g/dL (ref 6.5–8.1)

## 2019-07-26 LAB — URINALYSIS, ROUTINE W REFLEX MICROSCOPIC
Bilirubin Urine: NEGATIVE
Glucose, UA: NEGATIVE mg/dL
Ketones, ur: NEGATIVE mg/dL
Leukocytes,Ua: NEGATIVE
Nitrite: NEGATIVE
Protein, ur: 100 mg/dL — AB
RBC / HPF: >50 RBC/hpf — ABNORMAL HIGH (ref 0–5)
Specific Gravity, Urine: 1.016 (ref 1.005–1.030)
pH: 6 (ref 5.0–8.0)

## 2019-07-26 LAB — BASIC METABOLIC PANEL
Anion gap: 12 (ref 5–15)
BUN: 17 mg/dL (ref 8–23)
CO2: 23 mmol/L (ref 22–32)
Calcium: 9.4 mg/dL (ref 8.9–10.3)
Chloride: 104 mmol/L (ref 98–111)
Creatinine, Ser: 0.96 mg/dL (ref 0.44–1.00)
GFR calc Af Amer: >60 mL/min (ref >60)
GFR calc non Af Amer: 60 mL/min — ABNORMAL LOW (ref >60)
Glucose, Bld: 112 mg/dL — ABNORMAL HIGH (ref 70–99)
Potassium: 4.1 mmol/L (ref 3.5–5.1)
Sodium: 139 mmol/L (ref 135–145)

## 2019-07-26 LAB — LIPASE, BLOOD: Lipase: 30 U/L (ref 11–51)

## 2019-07-26 MED ORDER — DOXYCYCLINE HYCLATE 100 MG PO TABS
200.0000 mg | ORAL_TABLET | Freq: Once | ORAL | Status: AC
  Administered 2019-07-26: 200 mg via ORAL
  Filled 2019-07-26: qty 2

## 2019-07-26 MED ORDER — HYDROCODONE-ACETAMINOPHEN 5-325 MG PO TABS: 1.0000 | ORAL_TABLET | Freq: Four times a day (QID) | ORAL | 0 refills | Status: DC | PRN

## 2019-07-26 MED ORDER — TAMSULOSIN HCL 0.4 MG PO CAPS: 0.4000 mg | ORAL_CAPSULE | Freq: Every day | ORAL | 0 refills | Status: AC

## 2019-07-26 MED ORDER — SODIUM CHLORIDE 0.9 % IV BOLUS
500.0000 mL | Freq: Once | INTRAVENOUS | Status: AC
  Administered 2019-07-26: 500 mL via INTRAVENOUS

## 2019-07-26 MED ORDER — ONDANSETRON 4 MG PO TBDP
4.0000 mg | ORAL_TABLET | Freq: Three times a day (TID) | ORAL | 0 refills | Status: DC | PRN
Start: 2019-07-26 — End: 2019-08-31

## 2019-07-26 MED ORDER — HYDROCODONE-ACETAMINOPHEN 5-325 MG PO TABS
1.0000 | ORAL_TABLET | Freq: Once | ORAL | Status: AC
  Administered 2019-07-26: 1 via ORAL
  Filled 2019-07-26: qty 1

## 2019-07-26 NOTE — ED Triage Notes (Signed)
BIBA  Per EMS: Pt have Left flank pain this morning at 4am. Gradually worsening. Painful urination. Blood in urine. Pain 8/10  100 Mcg Fentanyl  Now pain 3/10   20 L AC  Vitals: 147/94 75 HR 97 SpO2

## 2019-07-26 NOTE — Discharge Instructions (Signed)
Please call [facility] today to schedule an outpatient follow up appointment given your recurrent kidney stones and symptoms today I have prescribed medication for you to take as needed for your pain and nausea. I have also prescribed medication to take to help you urinate more frequently; please increase your water intake as well.  Return to the ED IMMEDIATELY for any worsening symptoms including worsening pain, excessive vomiting, inability to urinate, fevers > 100.4, or any other new/concerning symptoms

## 2019-07-26 NOTE — ED Provider Notes (Signed)
[place] DEPT Provider Note   CSN: [id number] Arrival date & time: 07/26/19  1000     History Chief Complaint  Patient presents with  . Flank Pain    Anna Marquez is a 69 y.o. female with PMHx HTN, hiatal hernia, GERD, nephrolithiasis requiring stenting/uteroscopy in the past who presents to the ED via EMS today with complaint of sudden onset, constant, sharp, left abdominal/flank pain that woke patient out of her sleep this AM.  Patient reports she woke up and went to use the restroom when she noticed grossly bloody urine.  She states shortly afterwards she began having nausea with dry heaving.  She estimates about 3 episodes of dry heaving earlier today with worsening pain prompting her to call EMS.  She was given 100 mcg of fentanyl with EMS which reduced her pain from a 9 out of 10 to a 2 out of 10.  She states she has no longer feeling nauseated currently.  Patient states this feels similar to her prior kidney stones.  She does indicate she has had dysuria as well.  Patient denies fevers, chills, chest pain, shortness of breath, diarrhea, constipation, blood in stool, frequency, urgency, pelvic pain, vaginal discharge, any other associated symptoms.  Patient does also reports she pulled 2 ticks off of her 2 days ago.  She believes they were deer ticks.  She states she gets them quite frequently.  She noticed one on her left flank as well as in between her second and third toe on the left side.   The history is provided by the patient and medical records.       Past Medical History:  Diagnosis Date  . Adenomatous colon polyp 2003  . Cholelithiasis   . Diverticulosis of colon   . Gastric polyps   . GERD (gastroesophageal reflux disease)   . Helicobacter pylori (H. pylori)   . Hiatal hernia   . Hypertension   . Mitral valve prolapse   . Nephrolithiasis   . Osteoarthritis   . Osteopenia 03/2017   T score -1.5 FRAX 8.9% / 0.9%.  Stable from  prior DEXA 2016  . Persistent atrial fibrillation ([place])   . TGA (transient global amnesia)     Patient Active Problem List   Diagnosis Date Noted  . Sinobronchitis 02/01/2019  . Seasonal allergic rhinitis due to pollen 09/25/2018  . Reactive airway disease 09/25/2018  . Rash 06/25/2018  . Hypomagnesemia 03/25/2018  . Viral upper respiratory tract infection 03/16/2018  . Atrial fibrillation with RVR ([place]) 03/16/2018  . Anemia 02/04/2018  . Recurrent nephrolithiasis 02/04/2018  . Adhesive capsulitis 02/04/2018  . Screening for lipid disorders 10/28/2017  . Acute cystitis 10/22/2017  . Palpitations 01/31/2017  . Cough 11/21/2016  . Hypokalemia 03/07/2016  . TIA (transient ischemic attack) 03/07/2016  . Hematuria 12/06/2015  . Essential hypertension 01/27/2012  . A-fib ([place]) 01/09/2012  . Migraine without aura 05/20/2008  . Allergic rhinitis 05/20/2008  . Mitral regurgitation 08/10/2007  . GERD 08/10/2007  . Diaphragmatic hernia 08/10/2007  . DIVERTICULOSIS, COLON 08/10/2007  . Osteoarthritis 08/10/2007  . COLONIC POLYPS, ADENOMATOUS, HX OF 08/10/2007    Past Surgical History:  Procedure Laterality Date  . ANTERIOR AND POSTERIOR REPAIR N/A 06/14/2014   Procedure: ANTERIOR (CYSTOCELE) AND POSTERIOR REPAIR (RECTOCELE);  Surgeon: Anastasio Auerbach, MD;  Location: [facility];  Service: Gynecology;  Laterality: N/A;  . ATRIAL FIBRILLATION ABLATION N/A 02/11/2019   Procedure: ATRIAL FIBRILLATION ABLATION;  Surgeon: Thompson Grayer, MD;  Location: [facility];  Service: Cardiovascular;  Laterality: N/A;  . CARDIOVERSION N/A 01/15/2019   Procedure: CARDIOVERSION;  Surgeon: Fay Records, MD;  Location: [facility];  Service: Cardiovascular;  Laterality: N/A;  . CHOLECYSTECTOMY    . kidney stone removal     x2  . VAGINAL HYSTERECTOMY N/A 06/14/2014   Procedure: HYSTERECTOMY VAGINAL;  Surgeon: Anastasio Auerbach, MD;  Location: [facility];  Service: Gynecology;  Laterality: N/A;     OB  History    Gravida  2   Para  2   Term      Preterm      AB      Living  2     SAB      TAB      Ectopic      Multiple      Live Births              Family History  Problem Relation Age of Onset  . Cancer Father        prostate  . Breast cancer Paternal Aunt 18  . Diabetes Maternal Grandmother   . Heart disease Maternal Grandmother   . Heart disease Maternal Grandfather   . Melanoma Paternal Aunt   . Neuropathy Mother   . Congestive Heart Failure Mother   . Thyroid disease Daughter     Social History   Tobacco Use  . Smoking status: Never Smoker  . Smokeless tobacco: Never Used  Vaping Use  . Vaping Use: Never used  Substance Use Topics  . Alcohol use: Never    Alcohol/week: 0.0 standard drinks  . Drug use: No    Home Medications Prior to Admission medications   Medication Sig Start Date End Date Taking? Authorizing Provider  acetaminophen (TYLENOL) 500 MG tablet Take 1,000 mg by mouth every 6 (six) hours as needed for moderate pain or headache.    [provider]  albuterol (VENTOLIN HFA) 108 (90 Base) MCG/ACT inhaler Inhale 1-2 puffs into the lungs every 6 (six) hours as needed for wheezing or shortness of breath. 11/05/18   [name], DO  Ascorbic Acid (VITAMIN C) 1000 MG tablet Take 1,000 mg by mouth daily.    [provider]  Cholecalciferol (VITAMIN D) 50 MCG (2000 UT) tablet Take 2,000 Units by mouth daily.    [provider]  diltiazem (CARDIZEM) 30 MG tablet Take 1 Tablet Every 4 Hours As Needed For HR <100 Patient not taking: Reported on 07/07/2019 11/09/18   Sherran Needs, NP  dofetilide (TIKOSYN) 250 MCG capsule Take 1 capsule (250 mcg total) by mouth 2 (two) times daily. 01/22/19   Shirley Friar, PA-C  ELIQUIS 5 MG TABS tablet TAKE 1 TABLET BY MOUTH 2 TIMES DAILY. 01/13/19   Sherran Needs, NP  ferrous sulfate 325 (65 FE) MG tablet Take 325 mg by mouth every other day.    [provider]   fluticasone (FLONASE) 50 MCG/ACT nasal spray Place 1 spray into both nostrils as needed for allergies or rhinitis. 11/05/18   [name], DO  furosemide (LASIX) 20 MG tablet Take 1 tablet (20 mg total) by mouth daily as needed for edema. 04/13/19   Sherran Needs, NP  HYDROcodone-acetaminophen (NORCO/VICODIN) 5-325 MG tablet Take 1 tablet by mouth every 6 (six) hours as needed for severe pain. 07/26/19   Bowe Sidor, PA-C  lisinopril (ZESTRIL) 20 MG tablet Take 1 tablet (20 mg total) by mouth daily. 07/14/19 10/12/19  Thompson Grayer, MD  loratadine (CLARITIN) 10 MG tablet Take 10 mg by mouth daily.     [provider]  Magnesium 250 MG TABS Take 1 tablet (250 mg total) by mouth daily. 12/25/18   Shirley Friar, PA-C  montelukast (SINGULAIR) 10 MG tablet TAKE 1 TABLET BY MOUTH DAILY 07/14/19   Cirigliano, Mary K, DO  nadolol (CORGARD) 40 MG tablet Take 1.5 tablets (60 mg total) by mouth 2 (two) times daily. 01/04/19   Sherran Needs, NP  ondansetron (ZOFRAN ODT) 4 MG disintegrating tablet Take 1 tablet (4 mg total) by mouth every 8 (eight) hours as needed for nausea or vomiting. 07/26/19   Alroy Bailiff, Charnelle Bergeman, PA-C  pantoprazole (PROTONIX) 40 MG tablet Take 1 tablet (40 mg total) by mouth daily. 11/05/18   [name], DO  potassium chloride (K-DUR) 10 MEQ tablet Take 1 tablet (10 mEq total) by mouth daily as needed (take with HCTZ). 06/18/18   Nahser, Wonda Cheng, MD  Probiotic Product (PROBIOTIC PO) Take 1 tablet by mouth daily.    [provider]  tamsulosin (FLOMAX) 0.4 MG CAPS capsule Take 1 capsule (0.4 mg total) by mouth daily for 7 days. 07/26/19 08/02/19  Eustaquio Maize, PA-C  vitamin E 180 MG (400 UNITS) capsule Take 400 Units by mouth daily.    [provider]    Allergies    Erythromycin, Nsaids, and Penicillins  Review of Systems   Review of Systems  Constitutional: Negative for chills and fever.  Respiratory: Negative for shortness of breath.     Cardiovascular: Negative for chest pain.  Gastrointestinal: Positive for abdominal pain, nausea and vomiting. Negative for blood in stool, constipation and diarrhea.  Genitourinary: Positive for dysuria and flank pain. Negative for difficulty urinating, frequency, pelvic pain, vaginal bleeding and vaginal discharge.  All other systems reviewed and are negative.   Physical Exam Updated Vital Signs BP 138/81 (BP Location: Right Arm)   Pulse 72   Temp 97.9 F (36.6 C) (Oral)   Resp 20   SpO2 95%   Physical Exam Vitals and nursing note reviewed.  Constitutional:      Appearance: She is not ill-appearing or diaphoretic.  HENT:     Head: Normocephalic and atraumatic.  Eyes:     Conjunctiva/sclera: Conjunctivae normal.  Cardiovascular:     Rate and Rhythm: Normal rate and regular rhythm.     Pulses: Normal pulses.  Pulmonary:     Effort: Pulmonary effort is normal.     Breath sounds: Normal breath sounds. No wheezing, rhonchi or rales.  Abdominal:     Palpations: Abdomen is soft.     Tenderness: There is abdominal tenderness. There is no guarding or rebound.     Comments: Soft, +BS throughout, + LLQ abdominal TTP as well as left flank TTP, no obvious CVA TTP, no r/g/r, neg murphy's, neg mcburney's  Musculoskeletal:     Cervical back: Neck supple.  Skin:    General: Skin is warm and dry.  Neurological:     Mental Status: She is alert.     ED Results / Procedures / Treatments   Labs (all labs ordered are listed, but only abnormal results are displayed) Labs Reviewed  URINALYSIS, ROUTINE W REFLEX MICROSCOPIC - Abnormal; Notable for the following components:      Result Value   APPearance CLOUDY (*)    Hgb urine dipstick LARGE (*)    Protein, ur 100 (*)    RBC / HPF >50 (*)  Bacteria, UA RARE (*)    All other components within normal limits  BASIC METABOLIC PANEL - Abnormal; Notable for the following components:   Glucose, Bld 112 (*)    GFR calc non Af Amer 60 (*)     All other components within normal limits  HEPATIC FUNCTION PANEL - Abnormal; Notable for the following components:   Total Bilirubin 2.1 (*)    Indirect Bilirubin 1.9 (*)    All other components within normal limits  CBC  LIPASE, BLOOD    EKG None  Radiology CT Renal Stone Study  Result Date: 07/26/2019 CLINICAL DATA:  Left-sided abdominal pain since yesterday. EXAM: CT ABDOMEN AND PELVIS WITHOUT CONTRAST TECHNIQUE: Multidetector CT imaging of the abdomen and pelvis was performed following the standard protocol without IV contrast. COMPARISON:  10/21/2017 FINDINGS: Lower chest: Stable borderline cardiac enlargement. No pericardial effusion. Moderate to large hiatal hernia noted. The lung bases are clear of an acute process. No pleural effusion or pulmonary lesions. Hepatobiliary: Small calcified granuloma noted. No worrisome hepatic lesions. No intrahepatic biliary dilatation. The gallbladder is surgically absent. No common bile duct dilatation. Pancreas: No mass, inflammation or ductal dilatation. Spleen: Normal size. No focal lesions. Adrenals/Urinary Tract: Adrenal glands are normal. Chronic left UPJ obstruction. No renal or obstructing ureteral calculi. No worrisome renal lesions are identified without contrast. The bladder is unremarkable. Stomach/Bowel: The stomach, duodenum, small bowel and colon are grossly normal without oral contrast. No acute inflammatory changes, mass lesions or obstructive findings. The terminal ileum and appendix are normal. Moderate colonic diverticulosis but no findings for acute diverticulitis. Vascular/Lymphatic: Scattered aortic calcifications but no aneurysm. No mesenteric or retroperitoneal mass or lymphadenopathy. Reproductive: The uterus and ovaries are unremarkable. Other: No pelvic mass or adenopathy. No free pelvic fluid collections. No inguinal mass or adenopathy. No abdominal wall hernia or subcutaneous lesions. Musculoskeletal: No significant bony  findings. Mild age related osteoporosis. IMPRESSION: 1. No acute abdominal/pelvic findings, mass lesions or adenopathy. 2. No renal, ureteral or bladder calculi or mass. 3. Chronic left UPJ obstruction. 4. Moderate to large hiatal hernia. 5. Status post cholecystectomy. No biliary dilatation. 6. Aortic atherosclerosis. Aortic Atherosclerosis (ICD10-I70.0). Electronically Signed   By: Marijo Sanes M.D.   On: 07/26/2019 11:39    Procedures Procedures (including critical care time)  Medications Ordered in ED Medications  HYDROcodone-acetaminophen (NORCO/VICODIN) 5-325 MG per tablet 1 tablet (has no administration in time range)  sodium chloride 0.9 % bolus 500 mL (500 mLs Intravenous Bolus from Bag 07/26/19 1120)  doxycycline (VIBRA-TABS) tablet 200 mg (200 mg Oral Given 07/26/19 1120)    ED Course  I have reviewed the triage vital signs and the nursing notes.  Pertinent labs & imaging results that were available during my care of the patient were reviewed by me and considered in my medical decision making (see chart for details).    MDM Rules/Calculators/A&P                          69 year old female with a history of recurrent kidney stones who presents to the ED today with sudden onset left flank pain, grossly bloody urine, nausea, vomiting.  Last stone approximately 2 years ago and states this feels very similar.  On arrival to the ED patient is afebrile, nontachycardic nontachypneic.  She appears to be in no acute distress.  She was given 100 mcg of fentanyl in route prior to arrival with EMS.  Reports her pain is currently a  2 out of 10, decreased from a 9 out of 10.  On exam patient does have some left lower quadrant abdominal tenderness as well as left flank tenderness palpation.  There is no obvious CVA tenderness palpation however.  I personally visualized her urine prior to being sent to the lab, gross hematuria present.  She does states she has had some dysuria, no frequency or  urgency.  Will test for infection.  We will plan for screening labs including CBC, CMP, lipase given left-sided pain.  Will obtain CT renal stone study to assess for hydro-.  Fluids provided.  Also given 1 dose of doxycycline given recent tick bites which she removed 2 days ago.  Unsure of how long they were present on the body.  No signs of Lyme rash or [facility] spotted fever rash.  No other symptoms besides symptoms consistent with likely kidney stone.   UA cloudy with large hemoglobin, greater than 50 red blood cells.  No nitrites or leuks.  CBC without leukocytosis.  Hemoglobin stable at 13.0. BMP with glucose 112.  No other findings.  Creatinine stable at 0.96. LFTs with mild increase in T bili at 2.1, indirect bili at 1.9.  Patient with history of cholecystectomy.  No epigastric or right upper quadrant pain. Lipase 30  CT scan with no acute findings.  Radiologist has read chronic UPJ obstruction.  Attending physician Dr. Sedonia Small and I personally visualized the images, difficult to say if this is truly chronic or not.  Will consult urology at this time for further recommendations.  Discussed case with urologist Dr. Claudia Desanctis who evaluated images, does not see an obvious stone however states that patient can follow-up outpatient.  Does not think Flomax would harm patient and feels this is reasonable at this time.  Will plan for short course of pain medication, Flomax, Zofran as needed.  Patient will need to call [facility] to schedule an appointment, does appear she has no showed her last 2 previous appointments.   Have discussed plan with pt; her pain is somewhat returning. Will provide 1 dose of pain medicine here. Plan to dispo with short course of norco, flomax, and zofran. Pt to call [facility] for appt. Strict return precautions discussed - she is in agreement with plan and stable for discharge home.   This note was prepared using Dragon voice recognition software and may include  unintentional dictation errors due to the inherent limitations of voice recognition software.  Final Clinical Impression(s) / ED Diagnoses Final diagnoses:  Left flank pain  Hematuria, unspecified type    Rx / DC Orders ED Discharge Orders         Ordered    tamsulosin (FLOMAX) 0.4 MG CAPS capsule  Daily     Discontinue  Reprint     07/26/19 1227    HYDROcodone-acetaminophen (NORCO/VICODIN) 5-325 MG tablet  Every 6 hours PRN     Discontinue  Reprint     07/26/19 1227    ondansetron (ZOFRAN ODT) 4 MG disintegrating tablet  Every 8 hours PRN     Discontinue  Reprint     07/26/19 1227           Discharge Instructions     Please call [facility] today to schedule an outpatient follow up appointment given your recurrent kidney stones and symptoms today I have prescribed medication for you to take as needed for your pain and nausea. I have also prescribed medication to take to help you urinate more  frequently; please increase your water intake as well.  Return to the ED IMMEDIATELY for any worsening symptoms including worsening pain, excessive vomiting, inability to urinate, fevers > 100.4, or any other new/concerning symptoms       Eustaquio Maize, PA-C 07/26/19 1232    Maudie Flakes, MD 07/26/19 1517

## 2019-07-26 NOTE — ED Notes (Signed)
Pt provided pillow for comfort measures. Pt declines any other needs at this moment.

## 2019-07-29 ENCOUNTER — Telehealth (HOSPITAL_COMMUNITY): Payer: Self-pay | Admitting: Nurse Practitioner

## 2019-07-29 NOTE — Telephone Encounter (Signed)
Pt called to inform us that she stopped dofetilide on Saturday, started with flank pain from  a kidney stone on Sunday and went back into afib. She had an ER visit for pain and now pain is better controlled but still in afib. Afib is controlled. She goes to the urologist on Monday to see what is necessary to treat her kidney stone. She has had to have surgery in the past for stones. She will continue to use Cardizem as needed to control v rates. She will call us on Monday to let us know the plans. We will  try to get back in rhythm when the kidney stone treatment is completed.

## 2019-08-02 DIAGNOSIS — Z87442 Personal history of urinary calculi: Secondary | ICD-10-CM | POA: Diagnosis not present

## 2019-08-02 DIAGNOSIS — N1330 Unspecified hydronephrosis: Secondary | ICD-10-CM | POA: Diagnosis not present

## 2019-08-09 ENCOUNTER — Encounter: Payer: Self-pay | Admitting: Internal Medicine

## 2019-08-09 ENCOUNTER — Other Ambulatory Visit: Payer: Self-pay

## 2019-08-09 ENCOUNTER — Ambulatory Visit: Payer: PPO | Admitting: Internal Medicine

## 2019-08-09 ENCOUNTER — Ambulatory Visit (HOSPITAL_COMMUNITY): Payer: PPO | Attending: Cardiology

## 2019-08-09 VITALS — BP 168/102 | HR 66 | Ht 64.0 in | Wt 172.0 lb

## 2019-08-09 DIAGNOSIS — I4892 Unspecified atrial flutter: Secondary | ICD-10-CM

## 2019-08-09 DIAGNOSIS — D6869 Other thrombophilia: Secondary | ICD-10-CM

## 2019-08-09 DIAGNOSIS — I10 Essential (primary) hypertension: Secondary | ICD-10-CM | POA: Diagnosis not present

## 2019-08-09 DIAGNOSIS — I4819 Other persistent atrial fibrillation: Secondary | ICD-10-CM

## 2019-08-09 LAB — ECHOCARDIOGRAM COMPLETE
Area-P 1/2: 4.26 cm2
MV M vel: 6.25 m/s
MV Peak grad: 156.3 mmHg
P 1/2 time: 1452 msec
Radius: 0.6 cm
S' Lateral: 2.8 cm

## 2019-08-09 NOTE — Progress Notes (Signed)
PCP: [name], DO Primary Cardiologist: Dr Acie Fredrickson Primary EP: Dr Rayann Heman  Anna Marquez is a 69 y.o. female who presents today for routine electrophysiology followup.  Since last being seen in our clinic, the patient reports doing very well.  Today, she denies symptoms of palpitations, chest pain, shortness of breath,  lower extremity edema, dizziness, presyncope, or syncope.  The patient is otherwise without complaint today.   Past Medical History:  Diagnosis Date  . Adenomatous colon polyp 2003  . Cholelithiasis   . Diverticulosis of colon   . Gastric polyps   . GERD (gastroesophageal reflux disease)   . Helicobacter pylori (H. pylori)   . Hiatal hernia   . Hypertension   . Mitral valve prolapse   . Nephrolithiasis   . Osteoarthritis   . Osteopenia 03/2017   T score -1.5 FRAX 8.9% / 0.9%.  Stable from prior DEXA 2016  . Persistent atrial fibrillation ([place])   . TGA (transient global amnesia)    Past Surgical History:  Procedure Laterality Date  . ANTERIOR AND POSTERIOR REPAIR N/A 06/14/2014   Procedure: ANTERIOR (CYSTOCELE) AND POSTERIOR REPAIR (RECTOCELE);  Surgeon: Anastasio Auerbach, MD;  Location: [facility];  Service: Gynecology;  Laterality: N/A;  . ATRIAL FIBRILLATION ABLATION N/A 02/11/2019   Procedure: ATRIAL FIBRILLATION ABLATION;  Surgeon: Thompson Grayer, MD;  Location: [facility];  Service: Cardiovascular;  Laterality: N/A;  . CARDIOVERSION N/A 01/15/2019   Procedure: CARDIOVERSION;  Surgeon: Fay Records, MD;  Location: [facility];  Service: Cardiovascular;  Laterality: N/A;  . CHOLECYSTECTOMY    . kidney stone removal     x2  . VAGINAL HYSTERECTOMY N/A 06/14/2014   Procedure: HYSTERECTOMY VAGINAL;  Surgeon: Anastasio Auerbach, MD;  Location: [facility];  Service: Gynecology;  Laterality: N/A;    ROS- all systems are reviewed and negatives except as per HPI above  Current Outpatient Medications  Medication Sig Dispense Refill  . acetaminophen (TYLENOL) 500  MG tablet Take 1,000 mg by mouth every 6 (six) hours as needed for moderate pain or headache.    . albuterol (VENTOLIN HFA) 108 (90 Base) MCG/ACT inhaler Inhale 1-2 puffs into the lungs every 6 (six) hours as needed for wheezing or shortness of breath. 18 g 3  . Ascorbic Acid (VITAMIN C) 1000 MG tablet Take 1,000 mg by mouth daily.    . Cholecalciferol (VITAMIN D) 50 MCG (2000 UT) tablet Take 2,000 Units by mouth daily.    [name] diltiazem (CARDIZEM) 30 MG tablet Take 1 Tablet Every 4 Hours As Needed For HR <100 45 tablet 1  . dofetilide (TIKOSYN) 250 MCG capsule Take 1 capsule (250 mcg total) by mouth 2 (two) times daily. 180 capsule 3  . ELIQUIS 5 MG TABS tablet TAKE 1 TABLET BY MOUTH 2 TIMES DAILY. 60 tablet 10  . ferrous sulfate 325 (65 FE) MG tablet Take 325 mg by mouth every other day.    . fluticasone (FLONASE) 50 MCG/ACT nasal spray Place 1 spray into both nostrils as needed for allergies or rhinitis. 16 g 6  . furosemide (LASIX) 20 MG tablet Take 1 tablet (20 mg total) by mouth daily as needed for edema. 15 tablet 1  . HYDROcodone-acetaminophen (NORCO/VICODIN) 5-325 MG tablet Take 1 tablet by mouth every 6 (six) hours as needed for severe pain. 15 tablet 0  . lisinopril (ZESTRIL) 20 MG tablet Take 1 tablet (20 mg total) by mouth daily. 90 tablet 3  . loratadine (CLARITIN) 10 MG  tablet Take 10 mg by mouth daily.     . Magnesium 250 MG TABS Take 1 tablet (250 mg total) by mouth daily.  0  . montelukast (SINGULAIR) 10 MG tablet TAKE 1 TABLET BY MOUTH DAILY 90 tablet 3  . nadolol (CORGARD) 40 MG tablet Take 1.5 tablets (60 mg total) by mouth 2 (two) times daily. 270 tablet 3  . ondansetron (ZOFRAN ODT) 4 MG disintegrating tablet Take 1 tablet (4 mg total) by mouth every 8 (eight) hours as needed for nausea or vomiting. 20 tablet 0  . pantoprazole (PROTONIX) 40 MG tablet Take 1 tablet (40 mg total) by mouth daily. 90 tablet 4  . potassium chloride (K-DUR) 10 MEQ tablet Take 1 tablet (10 mEq total)  by mouth daily as needed (take with HCTZ). 30 tablet 6  . Probiotic Product (PROBIOTIC PO) Take 1 tablet by mouth daily.    . vitamin E 180 MG (400 UNITS) capsule Take 400 Units by mouth daily.     No current facility-administered medications for this visit.    Physical Exam: Vitals:   08/09/19 0953  BP: (!) 168/102  Pulse: 66  SpO2: 95%  Weight: 172 lb (78 kg)  Height: 5\' 4"  (1.626 m)    GEN- The patient is well appearing, alert and oriented x 3 today.   Head- normocephalic, atraumatic Eyes-  Sclera clear, conjunctiva pink Ears- hearing intact Oropharynx- clear Lungs- Clear to ausculation bilaterally, normal work of breathing Heart- Regular rate and rhythm, no murmurs, rubs or gallops, PMI not laterally displaced GI- soft, NT, ND, + BS Extremities- no clubbing, cyanosis, or edema  Wt Readings from Last 3 Encounters:  08/09/19 172 lb (78 kg)  07/07/19 168 lb (76.2 kg)  05/12/19 174 lb (78.9 kg)    EKG tracing ordered today is personally reviewed and shows sinus rhythm, PR 198 msec, Qtc 425 msec  Assessment and Plan:  1. Persistent afib/ atrial flutter Doing very well post ablation off tikosyn Change Mg and K to prn Continue eliquis for chads2vasc score of 4 (with reduced EF)  2. HTN Stable at home Repeat by me today was 144/98 No change required today;  3. Nonischemic CM EF previously 45% in the setting of afib Repeat echo today is pending   Risks, benefits and potential toxicities for medications prescribed and/or refilled reviewed with patient today.   Return in 4 months  Thompson Grayer MD, [facility] 08/09/2019 10:09 AM

## 2019-08-09 NOTE — Patient Instructions (Addendum)
Medication Instructions:  Your physician has recommended you make the following change in your medication:  1 Stop Dofetilide (Tikosyn) 2 Take Magnesium 250mg  1 tablet by mouth as need when you take your Furosemide.   *If you need a refill on your cardiac medications before your next appointment, please call your pharmacy*  Lab Work: None ordered.  If you have labs (blood work) drawn today and your tests are completely normal, you will receive your results only by: [name] MyChart Message (if you have MyChart) OR . A paper copy in the mail If you have any lab test that is abnormal or we need to change your treatment, we will call you to review the results.  Testing/Procedures: None ordered.  Follow-Up: At [facility], you and your health needs are our priority.  As part of our continuing mission to provide you with exceptional heart care, we have created designated Provider Care Teams.  These Care Teams include your primary Cardiologist (physician) and Advanced Practice Providers (APPs -  Physician Assistants and Nurse Practitioners) who all work together to provide you with the care you need, when you need it.  We recommend signing up for the patient portal called "MyChart".  Sign up information is provided on this After Visit Summary.  MyChart is used to connect with patients for Virtual Visits (Telemedicine).  Patients are able to view lab/test results, encounter notes, upcoming appointments, etc.  Non-urgent messages can be sent to your provider as well.   To learn more about what you can do with MyChart, go to [email or web address].    Your next appointment:   Your physician wants you to follow-up in: 4 months with Dr. Rayann Heman. You will receive a reminder letter in the mail two months in advance. If you don't receive a letter, please call our office to schedule the follow-up appointment.   Other Instructions:

## 2019-08-12 ENCOUNTER — Other Ambulatory Visit (HOSPITAL_COMMUNITY): Payer: Self-pay | Admitting: Nurse Practitioner

## 2019-08-27 ENCOUNTER — Other Ambulatory Visit (HOSPITAL_COMMUNITY): Payer: Self-pay | Admitting: Urology

## 2019-08-27 DIAGNOSIS — N1330 Unspecified hydronephrosis: Secondary | ICD-10-CM

## 2019-08-31 ENCOUNTER — Encounter: Payer: Self-pay | Admitting: Cardiovascular Disease

## 2019-08-31 ENCOUNTER — Other Ambulatory Visit: Payer: Self-pay

## 2019-08-31 ENCOUNTER — Ambulatory Visit: Payer: PPO | Admitting: Cardiovascular Disease

## 2019-08-31 VITALS — BP 122/82 | HR 75 | Ht 64.0 in | Wt 168.8 lb

## 2019-08-31 DIAGNOSIS — I340 Nonrheumatic mitral (valve) insufficiency: Secondary | ICD-10-CM | POA: Diagnosis not present

## 2019-08-31 DIAGNOSIS — I480 Paroxysmal atrial fibrillation: Secondary | ICD-10-CM | POA: Diagnosis not present

## 2019-08-31 NOTE — Progress Notes (Signed)
Anna Marquez Date of Birth  [birth year]       [place] [address], [address], [place], [place]  [address]   [place], [place]  [address] [phone]     [phone]   Fax  [phone]    Fax [phone]  Problem List: 1. Paroxysmal atrial fibrillation - documented Dec. 2013.   CHADS2VASC = 3 ( female, age, HTN)  -  2. Hypertension -    3.  Mitral Valve prolapse 4. Asthma 5, GERD   Notes from 2014: Anna Marquez is a 69 year old female with a history of mitral valve prolapse and hypertension in the past. She recently presented to the hospital with palpitations.  She woke up with palpitations.  She drinks alcohol only very rarely.  She was found to have atrial fibrillation with a rapid ventricular response. She taken an extra Corgard prior to coming to the emergency room. She converted to sinus rhythm while in the emergency room. Followup EKG revealed normal sinus rhythm.  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 She tries to exercise on a daily basis.  She has had occasional palpitations that sound like PVCs .    April 27, 2012:  Anna Marquez is doing well.  No palpitations.  Exercising regularly.  No complaints  April 03, 2016:  Anna Marquez was recently admitted to the hospital with chest pain. She has a history of paroxysmal atrial fibrillation, hypertension, mitral valve prolapse, asthma, and gastroesophageal reflux disease.  She had a treadmill status which was negative for ischemia. She has normal left ventricular systolic function by echo.  She walks regularly .  Has had some CP that she thinks is reflux.   Is taking PPI   Is retired from [employer]   May 23, 2017: Anna Marquez is seen today for follow-up of her paroxysmal atrial for ablation, hypertension, mitral valve prolapse, and asthma.  She was seen last year. Has some palpitations.  Last for a few minutes. Also has some HR  irregularities that last for several hours.  These typically get better if she takes another Corgard.   Aug. 24, 2021:  Has hx of PAF . Had Afib ablation Feb. 3, ( [facility])  Echo showed normal left ventricular systolic function.  She has mild mitral valve prolapse with mild to moderate mitral regurgitation. Is feeling better .  Has some right thigh cranping  Distal Pulses are great  Suggested that she might be volume depleted or have low potassium  She needs to have a renogram to look for scarring in her ureters. If she needs to have subsequent urologic surgery, it will be okay for her to hold her Eliquis for 2 to 3 days prior to the procedure.  She is at low risk for the upcoming procedure if needed.  She is off tikosyn now .   Ding great.        Current Outpatient Medications on File Prior to Visit  Medication Sig Dispense Refill  . acetaminophen (TYLENOL) 500 MG tablet Take 1,000 mg by mouth every 6 (six) hours as needed for moderate pain or headache.    . albuterol (VENTOLIN HFA) 108 (90 Base) MCG/ACT inhaler Inhale 1-2 puffs into the lungs every 6 (six) hours as needed for wheezing or shortness of breath. 18 g 3  . Ascorbic Acid (VITAMIN C) 1000 MG tablet Take 1,000 mg by mouth daily.    [name]  Cholecalciferol (VITAMIN D) 50 MCG (2000 UT) tablet Take 2,000 Units by mouth daily.    [name] diltiazem (CARDIZEM) 30 MG tablet Take 1 Tablet Every 4 Hours As Needed For HR <100 45 tablet 1  . ELIQUIS 5 MG TABS tablet TAKE 1 TABLET BY MOUTH 2 TIMES DAILY. 60 tablet 10  . ferrous sulfate 325 (65 FE) MG tablet Take 325 mg by mouth every other day.    . fluticasone (FLONASE) 50 MCG/ACT nasal spray Place 1 spray into both nostrils as needed for allergies or rhinitis. 16 g 6  . furosemide (LASIX) 20 MG tablet TAKE 1 TABLET (20 MG TOTAL) BY MOUTH DAILY AS NEEDED FOR EDEMA. 15 tablet 1  . lisinopril (ZESTRIL) 20 MG tablet Take 1 tablet (20 mg total) by mouth daily. 90 tablet 3  . loratadine (CLARITIN) 10  MG tablet Take 10 mg by mouth daily.     . Magnesium 250 MG TABS Take 1 tablet by mouth as needed. When taking furosemide    . montelukast (SINGULAIR) 10 MG tablet TAKE 1 TABLET BY MOUTH DAILY 90 tablet 3  . nadolol (CORGARD) 40 MG tablet Take 1.5 tablets (60 mg total) by mouth 2 (two) times daily. 270 tablet 3  . pantoprazole (PROTONIX) 40 MG tablet Take 1 tablet (40 mg total) by mouth daily. 90 tablet 4  . potassium chloride (K-DUR) 10 MEQ tablet Take 1 tablet (10 mEq total) by mouth daily as needed (take with HCTZ). 30 tablet 6  . Probiotic Product (PROBIOTIC PO) Take 1 tablet by mouth daily.    . vitamin E 180 MG (400 UNITS) capsule Take 400 Units by mouth daily.     No current facility-administered medications on file prior to visit.    Allergies  Allergen Reactions  . Erythromycin     Upset stomach  . Nsaids Other (See Comments)    Has a history of bleeding ulcers, tolerates aspirin   . Penicillins Hives and Shortness Of Breath    Did it involve swelling of the face/tongue/throat, SOB, or low BP? Yes Did it involve sudden or severe rash/hives, skin peeling, or any reaction on the inside of your mouth or nose? No Did you need to seek medical attention at a hospital or doctor's office? No When did it last happen?45 years If all above answers are "NO", may proceed with cephalosporin use.     Past Medical History:  Diagnosis Date  . Adenomatous colon polyp 2003  . Cholelithiasis   . Diverticulosis of colon   . Gastric polyps   . GERD (gastroesophageal reflux disease)   . Helicobacter pylori (H. pylori)   . Hiatal hernia   . Hypertension   . Mitral valve prolapse   . Nephrolithiasis   . Osteoarthritis   . Osteopenia 03/2017   T score -1.5 FRAX 8.9% / 0.9%.  Stable from prior DEXA 2016  . Persistent atrial fibrillation ([place])   . TGA (transient global amnesia)     Past Surgical History:  Procedure Laterality Date  . ANTERIOR AND POSTERIOR REPAIR N/A 06/14/2014    Procedure: ANTERIOR (CYSTOCELE) AND POSTERIOR REPAIR (RECTOCELE);  Surgeon: Anastasio Auerbach, MD;  Location: [facility];  Service: Gynecology;  Laterality: N/A;  . ATRIAL FIBRILLATION ABLATION N/A 02/11/2019   Procedure: ATRIAL FIBRILLATION ABLATION;  Surgeon: Thompson Grayer, MD;  Location: [facility];  Service: Cardiovascular;  Laterality: N/A;  . CARDIOVERSION N/A 01/15/2019   Procedure: CARDIOVERSION;  Surgeon: Fay Records, MD;  Location: [facility];  Service: Cardiovascular;  Laterality: N/A;  . CHOLECYSTECTOMY    . kidney stone removal     x2  . VAGINAL HYSTERECTOMY N/A 06/14/2014   Procedure: HYSTERECTOMY VAGINAL;  Surgeon: Anastasio Auerbach, MD;  Location: [facility];  Service: Gynecology;  Laterality: N/A;    Social History   Tobacco Use  Smoking Status Never Smoker  Smokeless Tobacco Never Used    Social History   Substance and Sexual Activity  Alcohol Use Never  . Alcohol/week: 0.0 standard drinks    Family History  Problem Relation Age of Onset  . Cancer Father        prostate  . Breast cancer Paternal Aunt 55  . Diabetes Maternal Grandmother   . Heart disease Maternal Grandmother   . Heart disease Maternal Grandfather   . Melanoma Paternal Aunt   . Neuropathy Mother   . Congestive Heart Failure Mother   . Thyroid disease Daughter     Reviw of Systems:  Reviewed in the HPI.  All other systems are negative.   Physical Exam: Blood pressure 122/82, pulse 75, height 5\' 4"  (1.626 m), weight 168 lb 12.8 oz (76.6 kg), SpO2 96 %.  GEN:  Well nourished, well developed in no acute distress HEENT: Normal NECK: No JVD; No carotid bruits LYMPHATICS: No lymphadenopathy CARDIAC: RRR ,  No significant mumur  RESPIRATORY:  Clear to auscultation without rales, wheezing or rhonchi  ABDOMEN: Soft, non-tender, non-distended MUSCULOSKELETAL:  No edema; No deformity  SKIN: Warm and dry NEUROLOGIC:  Alert and oriented x 3   ECG:    Assessment / Plan:   1. Paroxysmal  atrial fibrillation - documented Dec. 2013.   CHADS2VASC = 3 ( female, age, HTN)  -     She is status post A. fib ablation.  She was previously on Tikosyn but this did not work did not work for her prior to the ablation.  She has since stopped it and has maintained sinus rhythm.  I think for now we will continue off the Tikosyn.  If she has further breakthroughs then we will have her discussed with Dr. Rayann Heman.  2. Hypertension -blood pressure is very well controlled.  3.  Mitral Valve prolapse -she has mild to moderate mitral regurgitation by echo.  She is basically asymptomatic.  4. Asthma 5, GERD      Mertie Moores, MD  08/31/2019 4:15 PM    [facility] [place],  [place], [place]  [address] Pager [phone] Phone: [phone]; Fax: [phone]

## 2019-08-31 NOTE — Patient Instructions (Signed)

## 2019-09-01 ENCOUNTER — Ambulatory Visit (HOSPITAL_COMMUNITY)
Admission: RE | Admit: 2019-09-01 | Discharge: 2019-09-01 | Disposition: A | Discharge status: Home / Self Care | Payer: PPO | Source: Ambulatory Visit | Attending: Urology | Admitting: Urology

## 2019-09-01 DIAGNOSIS — N1330 Unspecified hydronephrosis: Secondary | ICD-10-CM | POA: Diagnosis not present

## 2019-09-01 MED ORDER — FUROSEMIDE 10 MG/ML IJ SOLN
38.0000 mg | Freq: Once | INTRAMUSCULAR | Status: AC
  Administered 2019-09-01: 38 mg via INTRAVENOUS

## 2019-09-01 MED ORDER — FUROSEMIDE 10 MG/ML IJ SOLN
INTRAMUSCULAR | Status: AC
  Filled 2019-09-01: qty 4

## 2019-09-01 MED ORDER — TECHNETIUM TC 99M MERTIATIDE
5.1000 | Freq: Once | INTRAVENOUS | Status: AC | PRN
  Administered 2019-09-01: 5.1 via INTRAVENOUS

## 2019-09-07 ENCOUNTER — Other Ambulatory Visit: Payer: Self-pay | Admitting: Obstetrics and Gynecology

## 2019-09-07 ENCOUNTER — Other Ambulatory Visit: Payer: Self-pay

## 2019-09-07 ENCOUNTER — Ambulatory Visit (INDEPENDENT_AMBULATORY_CARE_PROVIDER_SITE_OTHER): Payer: PPO

## 2019-09-07 DIAGNOSIS — M85852 Other specified disorders of bone density and structure, left thigh: Secondary | ICD-10-CM

## 2019-09-07 DIAGNOSIS — Z780 Asymptomatic menopausal state: Secondary | ICD-10-CM

## 2019-09-07 DIAGNOSIS — M8580 Other specified disorders of bone density and structure, unspecified site: Secondary | ICD-10-CM

## 2019-09-07 DIAGNOSIS — Z01419 Encounter for gynecological examination (general) (routine) without abnormal findings: Secondary | ICD-10-CM

## 2019-09-16 DIAGNOSIS — N1330 Unspecified hydronephrosis: Secondary | ICD-10-CM | POA: Diagnosis not present

## 2019-09-16 DIAGNOSIS — Z87442 Personal history of urinary calculi: Secondary | ICD-10-CM | POA: Diagnosis not present

## 2019-10-12 ENCOUNTER — Other Ambulatory Visit: Payer: Self-pay | Admitting: Cardiovascular Disease

## 2019-11-10 ENCOUNTER — Other Ambulatory Visit: Payer: Self-pay | Admitting: Family Medicine

## 2019-12-06 ENCOUNTER — Encounter: Payer: Self-pay | Admitting: Internal Medicine

## 2019-12-06 ENCOUNTER — Ambulatory Visit: Payer: PPO | Admitting: Internal Medicine

## 2019-12-06 ENCOUNTER — Other Ambulatory Visit: Payer: Self-pay

## 2019-12-06 VITALS — BP 122/86 | HR 80 | Ht 64.0 in | Wt 172.0 lb

## 2019-12-06 DIAGNOSIS — I10 Essential (primary) hypertension: Secondary | ICD-10-CM

## 2019-12-06 DIAGNOSIS — I4892 Unspecified atrial flutter: Secondary | ICD-10-CM | POA: Diagnosis not present

## 2019-12-06 DIAGNOSIS — I480 Paroxysmal atrial fibrillation: Secondary | ICD-10-CM

## 2019-12-06 MED ORDER — NADOLOL 40 MG PO TABS
ORAL_TABLET | ORAL | 3 refills | Status: DC
Start: 2019-12-06 — End: 2020-07-12

## 2019-12-06 NOTE — Progress Notes (Signed)
PCP: [name], DO Primary Cardiologist: Dr Acie Fredrickson Primary EP: Dr Rayann Heman  Anna Marquez is a 69 y.o. female who presents today for routine electrophysiology followup.  Since last being seen in our clinic, the patient reports doing very well.  She has rare palpitations but appears to maintaining sinus rhythm.  Today, she denies symptoms of palpitations, chest pain, shortness of breath,  lower extremity edema, dizziness, presyncope, or syncope.  The patient is otherwise without complaint today.   Past Medical History:  Diagnosis Date  . Adenomatous colon polyp 2003  . Cholelithiasis   . Diverticulosis of colon   . Gastric polyps   . GERD (gastroesophageal reflux disease)   . Helicobacter pylori (H. pylori)   . Hiatal hernia   . Hypertension   . Mitral valve prolapse   . Nephrolithiasis   . Osteoarthritis   . Osteopenia 03/2017   T score -1.5 FRAX 8.9% / 0.9%.  Stable from prior DEXA 2016  . Persistent atrial fibrillation ([place])   . TGA (transient global amnesia)    Past Surgical History:  Procedure Laterality Date  . ANTERIOR AND POSTERIOR REPAIR N/A 06/14/2014   Procedure: ANTERIOR (CYSTOCELE) AND POSTERIOR REPAIR (RECTOCELE);  Surgeon: Anastasio Auerbach, MD;  Location: [facility];  Service: Gynecology;  Laterality: N/A;  . ATRIAL FIBRILLATION ABLATION N/A 02/11/2019   Procedure: ATRIAL FIBRILLATION ABLATION;  Surgeon: Thompson Grayer, MD;  Location: [facility];  Service: Cardiovascular;  Laterality: N/A;  . CARDIOVERSION N/A 01/15/2019   Procedure: CARDIOVERSION;  Surgeon: Fay Records, MD;  Location: [facility];  Service: Cardiovascular;  Laterality: N/A;  . CHOLECYSTECTOMY    . kidney stone removal     x2  . VAGINAL HYSTERECTOMY N/A 06/14/2014   Procedure: HYSTERECTOMY VAGINAL;  Surgeon: Anastasio Auerbach, MD;  Location: [facility];  Service: Gynecology;  Laterality: N/A;    ROS- all systems are reviewed and negatives except as per HPI above  Current Outpatient  Medications  Medication Sig Dispense Refill  . acetaminophen (TYLENOL) 500 MG tablet Take 1,000 mg by mouth every 6 (six) hours as needed for moderate pain or headache.    . albuterol (VENTOLIN HFA) 108 (90 Base) MCG/ACT inhaler Inhale 1-2 puffs into the lungs every 6 (six) hours as needed for wheezing or shortness of breath. 18 g 3  . Ascorbic Acid (VITAMIN C) 1000 MG tablet Take 1,000 mg by mouth daily.    . Cholecalciferol (VITAMIN D) 50 MCG (2000 UT) tablet Take 2,000 Units by mouth daily.    [name] diltiazem (CARDIZEM) 30 MG tablet Take 1 Tablet Every 4 Hours As Needed For HR <100 45 tablet 1  . ELIQUIS 5 MG TABS tablet TAKE 1 TABLET BY MOUTH 2 TIMES DAILY. 60 tablet 10  . ferrous sulfate 325 (65 FE) MG tablet Take 325 mg by mouth every other day.    . fluticasone (FLONASE) 50 MCG/ACT nasal spray Place 1 spray into both nostrils as needed for allergies or rhinitis. 16 g 6  . furosemide (LASIX) 20 MG tablet TAKE 1 TABLET (20 MG TOTAL) BY MOUTH DAILY AS NEEDED FOR EDEMA. 15 tablet 1  . loratadine (CLARITIN) 10 MG tablet Take 10 mg by mouth daily.     . Magnesium 250 MG TABS Take 1 tablet by mouth as needed. When taking furosemide    . montelukast (SINGULAIR) 10 MG tablet TAKE 1 TABLET BY MOUTH DAILY 90 tablet 3  . nadolol (CORGARD) 40 MG tablet Take 1.5 tablets (  60 mg total) by mouth 2 (two) times daily. 270 tablet 3  . pantoprazole (PROTONIX) 40 MG tablet TAKE 1 TABLET (40 MG TOTAL) BY MOUTH DAILY. 90 tablet 4  . potassium chloride (KLOR-CON) 10 MEQ tablet TAKE 1 TABLET BY MOUTH DAILY AS NEEDED (TAKE WITH HCTZ). 30 tablet 6  . Probiotic Product (PROBIOTIC PO) Take 1 tablet by mouth daily.    . vitamin E 180 MG (400 UNITS) capsule Take 400 Units by mouth daily.    [name] lisinopril (ZESTRIL) 20 MG tablet Take 1 tablet (20 mg total) by mouth daily. 90 tablet 3   No current facility-administered medications for this visit.    Physical Exam: Vitals:   12/06/19 1516  BP: 122/86  Pulse: 80  SpO2:  97%  Weight: 172 lb (78 kg)  Height: 5\' 4"  (1.626 m)    GEN- The patient is well appearing, alert and oriented x 3 today.   Head- normocephalic, atraumatic Eyes-  Sclera clear, conjunctiva pink Ears- hearing intact Oropharynx- clear Lungs- Clear to ausculation bilaterally, normal work of breathing Heart- Regular rate and rhythm, no murmurs, rubs or gallops, PMI not laterally displaced GI- soft, NT, ND, + BS Extremities- no clubbing, cyanosis, or edema  Wt Readings from Last 3 Encounters:  12/06/19 172 lb (78 kg)  08/31/19 168 lb 12.8 oz (76.6 kg)  08/09/19 172 lb (78 kg)    EKG tracing ordered today is personally reviewed and shows sinus rhythm  Assessment and Plan:  1. Persistent afib/ atrial flutter Well controlled post ablation off tikosyn chads2vasc score is 3. (CHF resolved)  She is on eliquis We discussed REACT AF or possibly monitoring long term with an ILR as an alternative to [facility] therapy.  For now I would advise that she continue eliquis. Reduce nadolol to 60mg  qam, 40mg  qpm (from 60mg  BID) and continue to wean as tolerated  2. HTN Stable No change required today  3. Nonischemic CM- tachycardia mediated EF has normalized post ablation  4. MV prolapse Mild to moderate MR Follows with Dr Acie Fredrickson   Risks, benefits and potential toxicities for medications prescribed and/or refilled reviewed with patient today.    Return in 6 months  Thompson Grayer MD, [facility] 12/06/2019 3:31 PM

## 2019-12-06 NOTE — Patient Instructions (Addendum)
Medication Instructions:  Change your Nadolol to 60 mg 1.5 tablet in the morning and 40 mg 1 tablet in the evening.   *If you need a refill on your cardiac medications before your next appointment, please call your pharmacy*  Lab Work: None ordered.  If you have labs (blood work) drawn today and your tests are completely normal, you will receive your results only by: [name] MyChart Message (if you have MyChart) OR . A paper copy in the mail If you have any lab test that is abnormal or we need to change your treatment, we will call you to review the results.  Testing/Procedures: None ordered.  Follow-Up: At [facility], you and your health needs are our priority.  As part of our continuing mission to provide you with exceptional heart care, we have created designated Provider Care Teams.  These Care Teams include your primary Cardiologist (physician) and Advanced Practice Providers (APPs -  Physician Assistants and Nurse Practitioners) who all work together to provide you with the care you need, when you need it.  We recommend signing up for the patient portal called "MyChart".  Sign up information is provided on this After Visit Summary.  MyChart is used to connect with patients for Virtual Visits (Telemedicine).  Patients are able to view lab/test results, encounter notes, upcoming appointments, etc.  Non-urgent messages can be sent to your provider as well.   To learn more about what you can do with MyChart, go to [email or web address].    Your next appointment:   Your physician wants you to follow-up in: 6 months with Dr. Rayann Heman. You will receive a reminder letter in the mail two months in advance. If you don't receive a letter, please call our office to schedule the follow-up appointment.    Other Instructions:

## 2019-12-09 ENCOUNTER — Other Ambulatory Visit: Payer: Self-pay | Admitting: Nurse Practitioner

## 2019-12-09 ENCOUNTER — Other Ambulatory Visit: Payer: Self-pay | Admitting: Family Medicine

## 2019-12-09 ENCOUNTER — Encounter: Payer: Self-pay | Admitting: Family Medicine

## 2019-12-09 MED ORDER — OSELTAMIVIR PHOSPHATE 75 MG PO CAPS
75.0000 mg | ORAL_CAPSULE | Freq: Every day | ORAL | 0 refills | Status: AC
Start: 2019-12-09 — End: 2019-12-19

## 2019-12-09 NOTE — Telephone Encounter (Signed)
Eliquis 5mg  refill request received. Patient is 69 years old, weight-78kg, Crea-0.96 on 07/26/2019, Diagnosis-Afib, and last seen by Dr. Rayann Heman on 12/06/2019. Dose is appropriate based on dosing criteria. Will send in refill to requested pharmacy.

## 2019-12-09 NOTE — Telephone Encounter (Signed)
I sent tamiflu in, however she needs an appt to establish with me here for any further prescriptions.    Thanks!

## 2019-12-20 ENCOUNTER — Ambulatory Visit: Payer: PPO | Admitting: Cardiovascular Disease

## 2019-12-22 ENCOUNTER — Ambulatory Visit: Payer: PPO | Admitting: Internal Medicine

## 2020-01-04 ENCOUNTER — Other Ambulatory Visit: Payer: Self-pay

## 2020-01-04 ENCOUNTER — Encounter: Payer: Self-pay | Admitting: Physician Assistant

## 2020-01-04 ENCOUNTER — Telehealth (INDEPENDENT_AMBULATORY_CARE_PROVIDER_SITE_OTHER): Payer: PPO | Admitting: Physician Assistant

## 2020-01-04 VITALS — Temp 99.6°F | Ht 64.0 in | Wt 160.0 lb

## 2020-01-04 DIAGNOSIS — R059 Cough, unspecified: Secondary | ICD-10-CM

## 2020-01-04 DIAGNOSIS — Z20822 Contact with and (suspected) exposure to covid-19: Secondary | ICD-10-CM

## 2020-01-04 DIAGNOSIS — R6889 Other general symptoms and signs: Secondary | ICD-10-CM | POA: Diagnosis not present

## 2020-01-04 DIAGNOSIS — J40 Bronchitis, not specified as acute or chronic: Secondary | ICD-10-CM

## 2020-01-04 LAB — POCT INFLUENZA A/B
Influenza A, POC: NEGATIVE
Influenza B, POC: NEGATIVE

## 2020-01-04 MED ORDER — HYDROCOD POLST-CPM POLST ER 10-8 MG/5ML PO SUER: 5.0000 mL | Freq: Two times a day (BID) | ORAL | 0 refills | Status: DC | PRN

## 2020-01-04 NOTE — Addendum Note (Signed)
Addended by: Barry Dienes A on: 01/04/2020 04:36 PM   Modules accepted: Orders

## 2020-01-04 NOTE — Progress Notes (Signed)
Negative for flu A and B. Waiting for covid results.

## 2020-01-04 NOTE — Progress Notes (Signed)
..Virtual Visit via Telephone Note  I connected with Anna Marquez on 01/04/20 at  2:40 PM EST by telephone and verified that I am speaking with the correct person using two identifiers.  Location: Patient: home Provider: clinic  .Anna Marquez KitchenParticipating in visit:  Patient: Anna Marquez Provider: [name] PA-C   I discussed the limitations, risks, security and privacy concerns of performing an evaluation and management service by telephone and the availability of in person appointments. I also discussed with the patient that there may be a patient responsible charge related to this service. The patient expressed understanding and agreed to proceed.   History of Present Illness: Pt is a 70 yo female with A.fib, HTN, reactive airway disease who calls into the clinic with 3 days of headache, cough, congestion, sneezing, body aches. Her temperature is about 99.6. about 2 weeks ago had positive flu exposure and took tamiflu for 14 days. She has had covid 2 shots, no booster and flu shot. She denies any loss of smell or taste, GI symptoms, SOB.  Her most concerning symptom is barking cough. She has hx of asthmatic bronchitis.    .. Active Ambulatory Problems    Diagnosis Date Noted   Migraine without aura 05/20/2008   Mitral regurgitation 08/10/2007   Allergic rhinitis 05/20/2008   GERD 08/10/2007   Diaphragmatic hernia 08/10/2007   DIVERTICULOSIS, COLON 08/10/2007   Osteoarthritis 08/10/2007   COLONIC POLYPS, ADENOMATOUS, HX OF 08/10/2007   A-fib ([place]) 01/09/2012   Essential hypertension 01/27/2012   Hematuria 12/06/2015   Hypokalemia 03/07/2016   TIA (transient ischemic attack) 03/07/2016   Cough 11/21/2016   Palpitations 01/31/2017   Screening for lipid disorders 10/28/2017   Anemia 02/04/2018   Recurrent nephrolithiasis 02/04/2018   Adhesive capsulitis 02/04/2018   Atrial fibrillation with RVR ([place]) 03/16/2018   Hypomagnesemia 03/25/2018   Seasonal allergic  rhinitis due to pollen 09/25/2018   Reactive airway disease 09/25/2018   Sinobronchitis 02/01/2019   Resolved Ambulatory Problems    Diagnosis Date Noted   DYSPLASTIC NEVUS, CHEST 11/14/2009   GASTRITIS, CHRONIC 08/10/2007   CHOLELITHIASIS 08/10/2007   UTI 03/21/2009   HEMATURIA UNSPECIFIED 03/21/2009   BARTHOLIN'S CYST, RIGHT 09/07/2007   OVARIAN CYST 08/10/2007   Abdominal pain, epigastric [id number]   HELICOBACTER PYLORI INFECTION, HX OF 08/10/2007   NEPHROLITHIASIS, HX OF 08/10/2007   Abnormal results of thyroid function studies 02/18/2012   Influenza with other respiratory manifestations 01/11/2013   Acute bacterial bronchitis 01/23/2013   Cystocele with uterine prolapse 02/25/2013   Uterine prolapse 06/14/2014   Transient global amnesia 08/21/2015   Acute encephalopathy 08/21/2015   Left flank pain 08/21/2015   Chest pain 03/07/2016   Acute cystitis 10/22/2017   Acute gastroenteritis 10/22/2017   Viral upper respiratory tract infection 03/16/2018   Rash 06/25/2018   Past Medical History:  Diagnosis Date   Adenomatous colon polyp 2003   Cholelithiasis    Diverticulosis of colon    Gastric polyps    GERD (gastroesophageal reflux disease)    Helicobacter pylori (H. pylori)    Hiatal hernia    Hypertension    Mitral valve prolapse    Nephrolithiasis    Osteopenia 03/2017   Persistent atrial fibrillation ([facility])    TGA (transient global amnesia)    Reviewed med, allergy, problem list.    Observations/Objective: No acute distress Able to complete sentences Barking cough over the phone No labored breathing or wheezing  .Anna Marquez Kitchen Today's Vitals   01/04/20 1425  Temp: 99.6 F (37.6  C)  TempSrc: Oral  Weight: 160 lb (72.6 kg)  Height: 5\' 4"  (1.626 m)   Body mass index is 27.46 kg/m.    Assessment and Plan: Anna Marquez KitchenMargi was seen today for cough.  Diagnoses and all orders for this visit:  Suspected COVID-19 virus  infection  Influenza-like symptoms  Cough -     chlorpheniramine-HYDROcodone (TUSSIONEX) 10-8 MG/5ML SUER; Take 5 mLs by mouth every 12 (twelve) hours as needed.   Please come to office to be swabbed for flu and covid. Discussed quarantine until results. Rest and hydrate. Start .Steward DroneVitamin D3 5000 IU (125 mcg) daily Vitamin C 500 mg twice daily Zinc 50 to 75 mg daily  Ok for mucinex/tylenol/ibuprofen. Sent cough syrup to help rest.  Discussed if problems breathing to alert Anna Marquez Kitchen or go to UC/ED.     Follow Up Instructions:    I discussed the assessment and treatment plan with the patient. The patient was provided an opportunity to ask questions and all were answered. The patient agreed with the plan and demonstrated an understanding of the instructions.   The patient was advised to call back or seek an in-person evaluation if the symptoms worsen or if the condition fails to improve as anticipated.  I provided 15 minutes of non-face-to-face time during this encounter.   [place], PA-C

## 2020-01-06 ENCOUNTER — Encounter: Payer: Self-pay | Admitting: Physician Assistant

## 2020-01-06 LAB — NOVEL CORONAVIRUS, NAA: SARS-CoV-2, NAA: DETECTED — AB

## 2020-01-06 LAB — SARS-COV-2, NAA 2 DAY TAT

## 2020-01-06 NOTE — Progress Notes (Signed)
Anna Marquez,   You are positive for covid. Please quarantine for 10 days from symptoms. Please take these vitamins and monitor your symptoms. Go to ER with any worsening breathing or severe symptoms.   [name].Vitamin D3 5000 IU (125 mcg) daily Vitamin C 500 mg twice daily Zinc 50 to 75 mg daily    Person Under Monitoring Name: Anna Marquez  Location: [place] [address]   Infection Prevention Recommendations for Individuals Confirmed to have, or Being Evaluated for, 2019 Novel Coronavirus (COVID-19) Infection Who Receive Care at Home  Individuals who are confirmed to have, or are being evaluated for, COVID-19 should follow the prevention steps below until a healthcare provider or local or state health department says they can return to normal activities.  Stay home except to get medical care You should restrict activities outside your home, except for getting medical care. Do not go to work, school, or public areas, and do not use public transportation or taxis.  Call ahead before visiting your doctor Before your medical appointment, call the healthcare provider and tell them that you have, or are being evaluated for, COVID-19 infection. This will help the healthcare provider's office take steps to keep other people from getting infected. Ask your healthcare provider to call the local or state health department.  Monitor your symptoms Seek prompt medical attention if your illness is worsening (e.g., difficulty breathing). Before going to your medical appointment, call the healthcare provider and tell them that you have, or are being evaluated for, COVID-19 infection. Ask your healthcare provider to call the local or state health department.  Wear a facemask You should wear a facemask that covers your nose and mouth when you are in the same room with other people and when you visit a healthcare provider. People who live with or visit you should also wear a facemask while  they are in the same room with you.  Separate yourself from other people in your home As much as possible, you should stay in a different room from other people in your home. Also, you should use a separate bathroom, if available.  Avoid sharing household items You should not share dishes, drinking glasses, cups, eating utensils, towels, bedding, or other items with other people in your home. After using these items, you should wash them thoroughly with soap and water.  Cover your coughs and sneezes Cover your mouth and nose with a tissue when you cough or sneeze, or you can cough or sneeze into your sleeve. Throw used tissues in a lined trash can, and immediately wash your hands with soap and water for at least 20 seconds or use an alcohol-based hand rub.  Wash your [facility] your hands often and thoroughly with soap and water for at least 20 seconds. You can use an alcohol-based hand sanitizer if soap and water are not available and if your hands are not visibly dirty. Avoid touching your eyes, nose, and mouth with unwashed hands.   Prevention Steps for Caregivers and Household Members of Individuals Confirmed to have, or Being Evaluated for, COVID-19 Infection Being Cared for in the Home  If you live with, or provide care at home for, a person confirmed to have, or being evaluated for, COVID-19 infection please follow these guidelines to prevent infection:  Follow healthcare provider's instructions Make sure that you understand and can help the patient follow any healthcare provider instructions for all care.  Provide for the patient's basic needs You should help the  patient with basic needs in the home and provide support for getting groceries, prescriptions, and other personal needs.  Monitor the patient's symptoms If they are getting sicker, call his or her medical provider and tell them that the patient has, or is being evaluated for, COVID-19 infection. This will help  the healthcare provider's office take steps to keep other people from getting infected. Ask the healthcare provider to call the local or state health department.  Limit the number of people who have contact with the patient If possible, have only one caregiver for the patient. Other household members should stay in another home or place of residence. If this is not possible, they should stay in another room, or be separated from the patient as much as possible. Use a separate bathroom, if available. Restrict visitors who do not have an essential need to be in the home.  Keep older adults, very young children, and other sick people away from the patient Keep older adults, very young children, and those who have compromised immune systems or chronic health conditions away from the patient. This includes people with chronic heart, lung, or kidney conditions, diabetes, and cancer.  Ensure good ventilation Make sure that shared spaces in the home have good air flow, such as from an air conditioner or an opened window, weather permitting.  Wash your hands often Wash your hands often and thoroughly with soap and water for at least 20 seconds. You can use an alcohol based hand sanitizer if soap and water are not available and if your hands are not visibly dirty. Avoid touching your eyes, nose, and mouth with unwashed hands. Use disposable paper towels to dry your hands. If not available, use dedicated cloth towels and replace them when they become wet.  Wear a facemask and gloves Wear a disposable facemask at all times in the room and gloves when you touch or have contact with the patient's blood, body fluids, and/or secretions or excretions, such as sweat, saliva, sputum, nasal mucus, vomit, urine, or feces.  Ensure the mask fits over your nose and mouth tightly, and do not touch it during use. Throw out disposable facemasks and gloves after using them. Do not reuse. Wash your hands immediately  after removing your facemask and gloves. If your personal clothing becomes contaminated, carefully remove clothing and launder. Wash your hands after handling contaminated clothing. Place all used disposable facemasks, gloves, and other waste in a lined container before disposing them with other household waste. Remove gloves and wash your hands immediately after handling these items.  Do not share dishes, glasses, or other household items with the patient Avoid sharing household items. You should not share dishes, drinking glasses, cups, eating utensils, towels, bedding, or other items with a patient who is confirmed to have, or being evaluated for, COVID-19 infection. After the person uses these items, you should wash them thoroughly with soap and water.  Wash laundry thoroughly Immediately remove and wash clothes or bedding that have blood, body fluids, and/or secretions or excretions, such as sweat, saliva, sputum, nasal mucus, vomit, urine, or feces, on them. Wear gloves when handling laundry from the patient. Read and follow directions on labels of laundry or clothing items and detergent. In general, wash and dry with the warmest temperatures recommended on the label.  Clean all areas the individual has used often Clean all touchable surfaces, such as counters, tabletops, doorknobs, bathroom fixtures, toilets, phones, keyboards, tablets, and bedside tables, every day. Also, clean any surfaces that  may have blood, body fluids, and/or secretions or excretions on them. Wear gloves when cleaning surfaces the patient has come in contact with. Use a diluted bleach solution (e.g., dilute bleach with 1 part bleach and 10 parts water) or a household disinfectant with a label that says EPA-registered for coronaviruses. To make a bleach solution at home, add 1 tablespoon of bleach to 1 quart (4 cups) of water. For a larger supply, add  cup of bleach to 1 gallon (16 cups) of water. Read labels of  cleaning products and follow recommendations provided on product labels. Labels contain instructions for safe and effective use of the cleaning product including precautions you should take when applying the product, such as wearing gloves or eye protection and making sure you have good ventilation during use of the product. Remove gloves and wash hands immediately after cleaning.  Monitor yourself for signs and symptoms of illness Caregivers and household members are considered close contacts, should monitor their health, and will be asked to limit movement outside of the home to the extent possible. Follow the monitoring steps for close contacts listed on the symptom monitoring form.   ? If you have additional questions, contact your local health department or call the epidemiologist on call at [phone] (available 24/7). ? This guidance is subject to change. For the most up-to-date guidance from [facility], please refer to their website: [employer]

## 2020-01-14 ENCOUNTER — Other Ambulatory Visit: Payer: Self-pay | Admitting: Physician Assistant

## 2020-01-14 DIAGNOSIS — R059 Cough, unspecified: Secondary | ICD-10-CM

## 2020-01-16 NOTE — Telephone Encounter (Signed)
Last written 01/04/2020 #70 mL no refills Video visit 01/04/2020

## 2020-01-17 MED ORDER — BENZONATATE 100 MG PO CAPS
100.0000 mg | ORAL_CAPSULE | Freq: Two times a day (BID) | ORAL | 0 refills | Status: DC | PRN
Start: 2020-01-17 — End: 2020-02-07

## 2020-01-17 NOTE — Telephone Encounter (Signed)
Spoke with patient. She states still having congestion and cough. She is using calcium, zinc, flonase. She is okay with trying Tessalon. RX pended.

## 2020-01-17 NOTE — Addendum Note (Signed)
Addended byAnnamaria Helling on: 01/17/2020 02:59 PM   Modules accepted: Orders

## 2020-01-17 NOTE — Telephone Encounter (Signed)
Need to call and check on patient. We do not typically give refills on controlled substance cough syrup. We could try tessalon pearls for a irritating cough. Find out how she is doing and what her symptoms are.

## 2020-02-07 ENCOUNTER — Other Ambulatory Visit: Payer: Self-pay

## 2020-02-07 ENCOUNTER — Encounter: Payer: Self-pay | Admitting: Family Medicine

## 2020-02-07 ENCOUNTER — Ambulatory Visit (INDEPENDENT_AMBULATORY_CARE_PROVIDER_SITE_OTHER): Payer: PPO | Admitting: Family Medicine

## 2020-02-07 DIAGNOSIS — I10 Essential (primary) hypertension: Secondary | ICD-10-CM

## 2020-02-07 DIAGNOSIS — I480 Paroxysmal atrial fibrillation: Secondary | ICD-10-CM | POA: Diagnosis not present

## 2020-02-07 DIAGNOSIS — M1711 Unilateral primary osteoarthritis, right knee: Secondary | ICD-10-CM

## 2020-02-07 DIAGNOSIS — R059 Cough, unspecified: Secondary | ICD-10-CM

## 2020-02-07 NOTE — Progress Notes (Signed)
Anna Marquez - 70 y.o. female MRN [id number]  Date of birth: 1950/08/31  Subjective Chief Complaint  Patient presents with  . Establish Care    HPI Anna Marquez is a 70 year old female with history of atrial fibrillation, hypertension, iron deficiency anemia, GERD here today for follow-up visit.  She had Covid at the end of December, continues to have lingering cough related to this.  This does seem to be slowly improving.  She denies shortness of breath, wheezing increased palpitations or dizziness.  She has history of atrial fibrillation, status post ablation.  She has done well since ablation and remained in normal sinus rhythm.  She is off of Tikosyn and her nadolol was recently reduced by Dr. Rayann Heman.  Her ejection fraction has improved on recent echocardiogram.  She has not required use of furosemide and her edema has improved.  She continues on lisinopril which she is tolerating well.  She does continue on Eliquis as well for anticoagulation.  She has been having some right knee pain as well as hip pain.  Denies any injury.  She denies any weakness or instability of the hip or knee.  She is limited as to what medication she can utilize for pain control due to anticoagulation.  She is also have bone density scan completed at her GYN office which showed a T score at the left femoral neck of -1.6.  She is taking vitamin D supplement however is not taking a calcium supplement at this time.  ROS:  A comprehensive ROS was completed and negative except as noted per HPI  Allergies  Allergen Reactions  . Erythromycin     Upset stomach  . Nsaids Other (See Comments)    Has a history of bleeding ulcers, tolerates aspirin   . Penicillins Hives and Shortness Of Breath    Did it involve swelling of the face/tongue/throat, SOB, or low BP? Yes Did it involve sudden or severe rash/hives, skin peeling, or any reaction on the inside of your mouth or nose? No Did you need to seek medical attention at a  hospital or doctor's office? No When did it last happen?45 years If all above answers are "NO", may proceed with cephalosporin use.     Past Medical History:  Diagnosis Date  . Adenomatous colon polyp 2003  . Cholelithiasis   . Diverticulosis of colon   . Gastric polyps   . GERD (gastroesophageal reflux disease)   . Helicobacter pylori (H. pylori)   . Hiatal hernia   . Hypertension   . Mitral valve prolapse   . Nephrolithiasis   . Osteoarthritis   . Osteopenia 03/2017   T score -1.5 FRAX 8.9% / 0.9%.  Stable from prior DEXA 2016  . Persistent atrial fibrillation ([place])   . TGA (transient global amnesia)     Past Surgical History:  Procedure Laterality Date  . ANTERIOR AND POSTERIOR REPAIR N/A 06/14/2014   Procedure: ANTERIOR (CYSTOCELE) AND POSTERIOR REPAIR (RECTOCELE);  Surgeon: Anastasio Auerbach, MD;  Location: [facility];  Service: Gynecology;  Laterality: N/A;  . ATRIAL FIBRILLATION ABLATION N/A 02/11/2019   Procedure: ATRIAL FIBRILLATION ABLATION;  Surgeon: Thompson Grayer, MD;  Location: [facility];  Service: Cardiovascular;  Laterality: N/A;  . CARDIOVERSION N/A 01/15/2019   Procedure: CARDIOVERSION;  Surgeon: Fay Records, MD;  Location: [facility];  Service: Cardiovascular;  Laterality: N/A;  . CHOLECYSTECTOMY    . kidney stone removal     x2  . VAGINAL HYSTERECTOMY N/A 06/14/2014  Procedure: HYSTERECTOMY VAGINAL;  Surgeon: Anastasio Auerbach, MD;  Location: [facility];  Service: Gynecology;  Laterality: N/A;    Social History   Socioeconomic History  . Marital status: Divorced    Spouse name: Not on file  . Number of children: Not on file  . Years of education: Not on file  . Highest education level: Not on file  Occupational History  . Not on file  Tobacco Use  . Smoking status: Never Smoker  . Smokeless tobacco: Never Used  Vaping Use  . Vaping Use: Never used  Substance and Sexual Activity  . Alcohol use: Never    Alcohol/week: 0.0 standard drinks   . Drug use: No  . Sexual activity: Yes    Birth control/protection: Post-menopausal    Comment: 1st intercourse 70 yo-Fewer than 5 partners  Other Topics Concern  . Not on file  Social History Narrative  . Not on file   Social Determinants of Health   Financial Resource Strain: Not on file  Food Insecurity: Not on file  Transportation Needs: Not on file  Physical Activity: Not on file  Stress: Not on file  Social Connections: Not on file    Family History  Problem Relation Age of Onset  . Cancer Father        prostate  . Breast cancer Paternal Aunt 34  . Diabetes Maternal Grandmother   . Heart disease Maternal Grandmother   . Heart disease Maternal Grandfather   . Melanoma Paternal Aunt   . Neuropathy Mother   . Congestive Heart Failure Mother   . Thyroid disease Daughter     Health Maintenance  Topic Date Due  . COVID-19 Vaccine (3 - Booster for Pfizer series) 09/24/2019  . MAMMOGRAM  07/20/2021  . COLONOSCOPY (Pts 45-96yrs Insurance coverage will need to be confirmed)  03/13/2023  . TETANUS/TDAP  10/29/2027  . INFLUENZA VACCINE  Completed  . DEXA SCAN  Completed  . Hepatitis C Screening  Completed  . PNA vac Low Risk Adult  Completed     ----------------------------------------------------------------------------------------------------------------------------------------------------------------------------------------------------------------- Physical Exam BP (!) 160/83 (BP Location: Left Arm, Patient Position: Sitting, Cuff Size: Normal)   Pulse 85   Temp 98.3 F (36.8 C)   Wt 173 lb 6.4 oz (78.7 kg)   SpO2 95%   BMI 29.76 kg/m   Physical Exam Constitutional:      Appearance: Normal appearance.  HENT:     Head: Normocephalic and atraumatic.  Eyes:     General: No scleral icterus. Cardiovascular:     Rate and Rhythm: Normal rate and regular rhythm.  Pulmonary:     Effort: Pulmonary effort is normal.     Breath sounds: Normal breath sounds.   Musculoskeletal:     Cervical back: Neck supple.  Skin:    General: Skin is warm and dry.  Neurological:     Mental Status: She is alert.  Psychiatric:        Mood and Affect: Mood normal.        Behavior: Behavior normal.     ------------------------------------------------------------------------------------------------------------------------------------------------------------------------------------------------------------------- Assessment and Plan  A-fib ([place]) Status post ablation.  She has done well since having this completed remain in normal sinus rhythm.  Nadolol recently decreased.  She has remained off antiarrhythmics.  She continues on eliquis for anticoagulation as her CHA2DS2-VASc score is 3.  Essential hypertension Blood pressure is elevated today however readings at home have been well controlled.  She will send me a log of her readings over the  next couple of weeks.  Osteoarthritis She is having pain in the right knee.  NSAID use is limited due to anticoagulation.  She will try topical diclofenac to see if this helps with her symptoms.  Cough She had Covid at the end of December/first part of January.  She continues to have lingering cough.  No other red flag symptoms at this time.  Discussed that cough may linger for several weeks after recovery of illness.  She will let me know if this persists and/or she develops any additional symptoms.   No orders of the defined types were placed in this encounter.   Return in about 6 months (around 08/06/2020) for HTN.    This visit occurred during the SARS-CoV-2 public health emergency.  Safety protocols were in place, including screening questions prior to the visit, additional usage of staff PPE, and extensive cleaning of exam room while observing appropriate contact time as indicated for disinfecting solutions.

## 2020-02-07 NOTE — Assessment & Plan Note (Signed)
Status post ablation.  She has done well since having this completed remain in normal sinus rhythm.  Nadolol recently decreased.  She has remained off antiarrhythmics.  She continues on eliquis for anticoagulation as her CHA2DS2-VASc score is 3.

## 2020-02-07 NOTE — Assessment & Plan Note (Signed)
Blood pressure is elevated today however readings at home have been well controlled.  She will send me a log of her readings over the next couple of weeks.

## 2020-02-07 NOTE — Assessment & Plan Note (Signed)
She is having pain in the right knee.  NSAID use is limited due to anticoagulation.  She will try topical diclofenac to see if this helps with her symptoms.

## 2020-02-07 NOTE — Assessment & Plan Note (Signed)
She had Covid at the end of December/first part of January.  She continues to have lingering cough.  No other red flag symptoms at this time.  Discussed that cough may linger for several weeks after recovery of illness.  She will let me know if this persists and/or she develops any additional symptoms.

## 2020-02-07 NOTE — Patient Instructions (Addendum)
Great to see you today! Keep an eye on blood pressure, send me readings after a couple of weeks.  Try voltaren gel to the knee. Let me know if this is not helping.  Start calcium supplement 500-750mg .

## 2020-02-27 IMAGING — DX CHEST - 2 VIEW
2 series · 2 of 2 positions shown · non-contrast
Comparison: 01/31/2017 chest radiograph

CLINICAL DATA: 68 y/o F; one week of cough. Chest pain and
shortness of breath this morning. Atrial fibrillation. History of
mitral valve prolapse.

EXAM:
CHEST - 2 VIEW

[w chest pa]
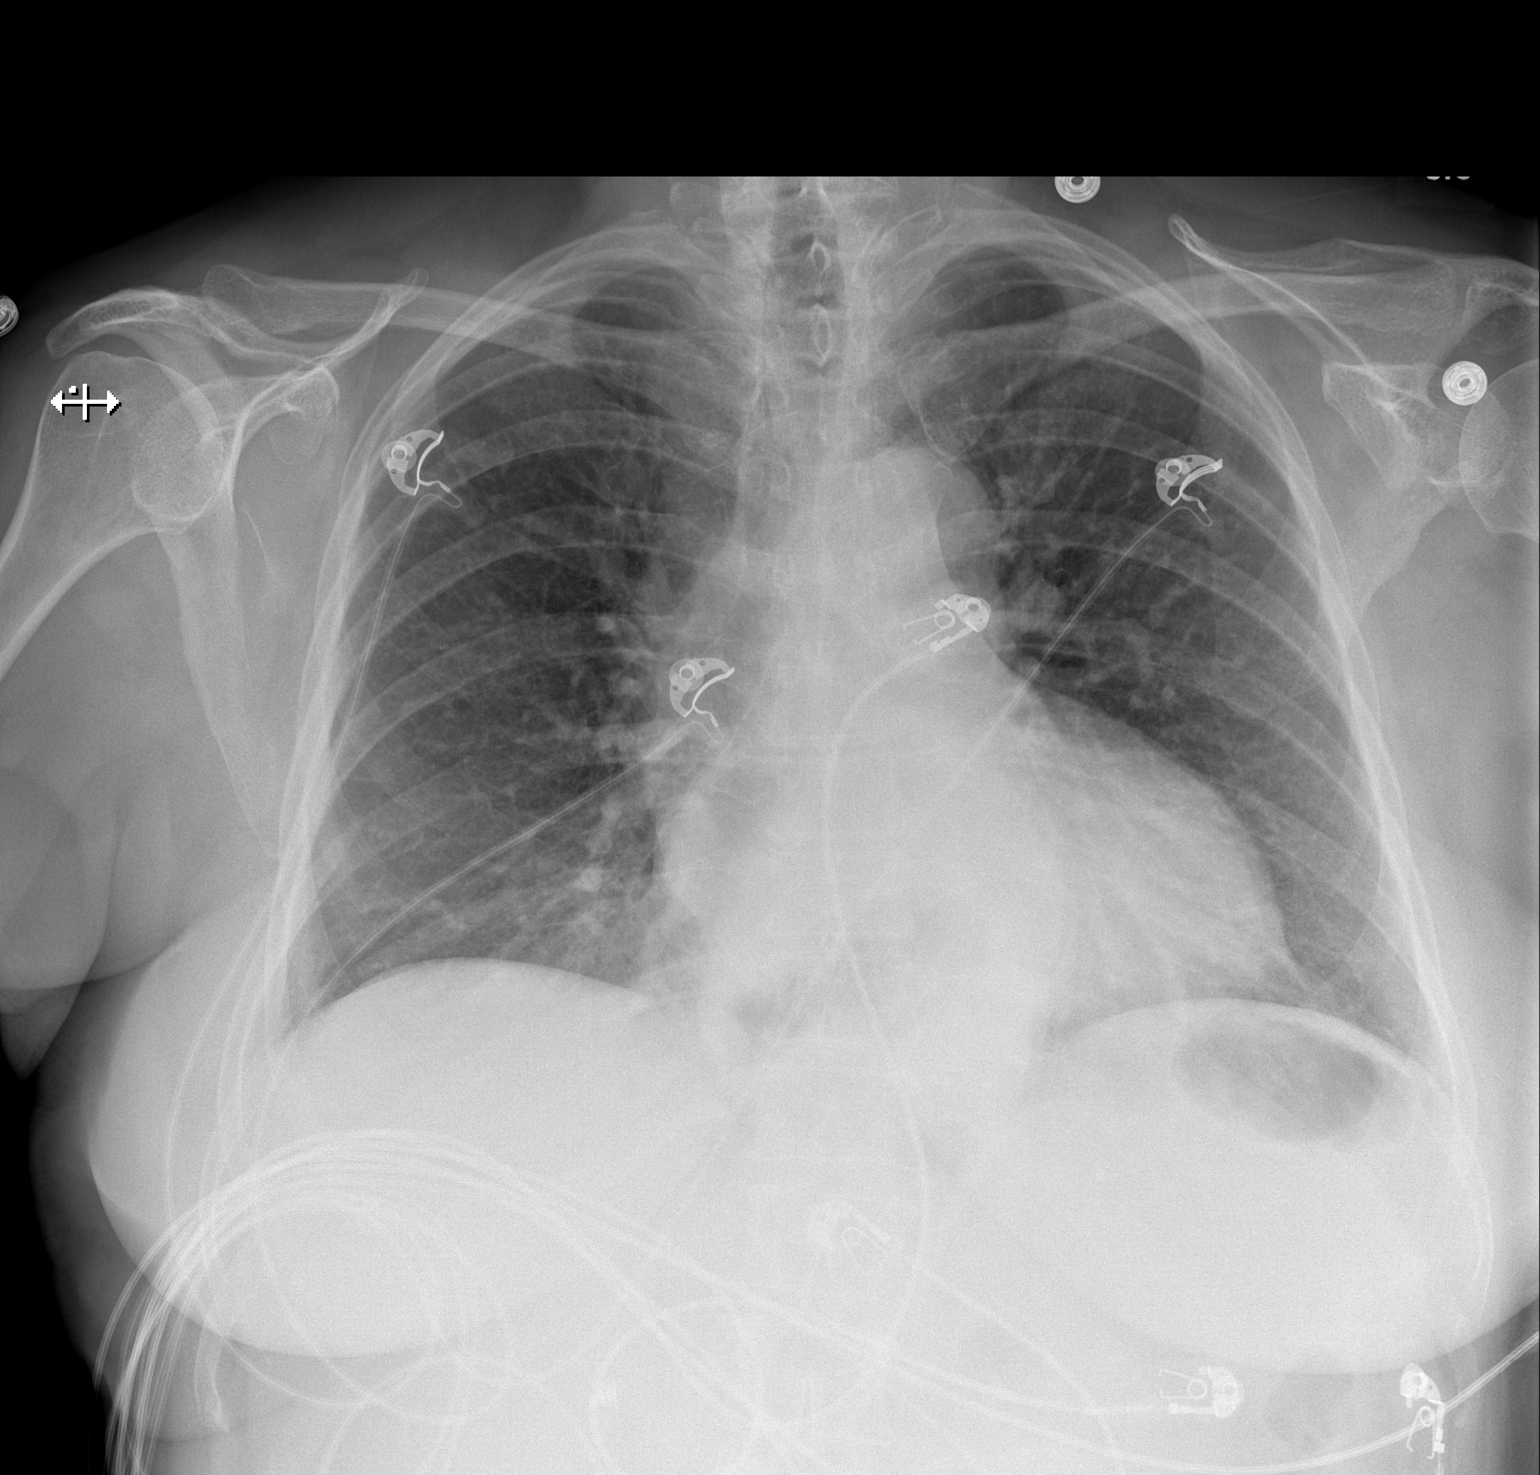

[w chest lat]
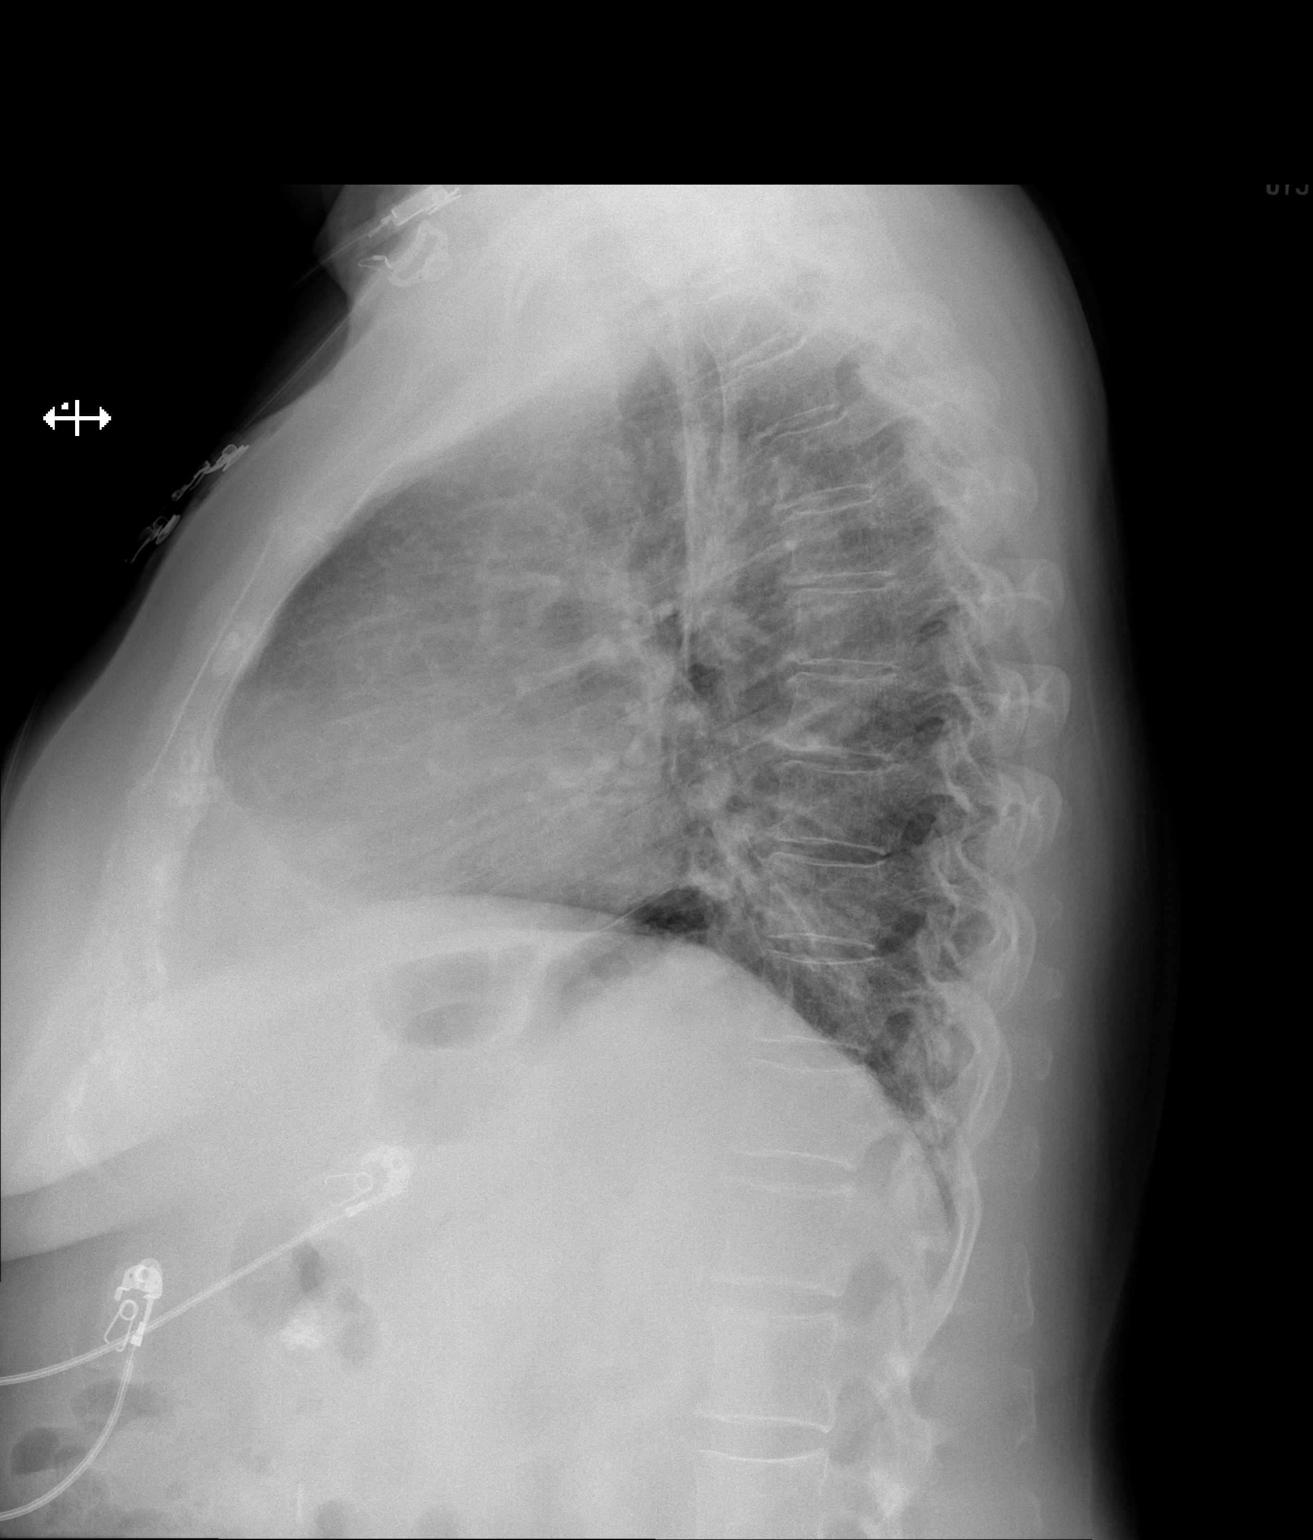

[2 of 2 positions shown; findings below may reference images not displayed]

FINDINGS: Stable cardiac silhouette within normal limits given projection and
technique. Stable moderate hiatal hernia. Pulmonary venous
hypertension. No consolidation, effusion, or pneumothorax. Bones are
unremarkable.
IMPRESSION: Pulmonary venous hypertension. Stable moderate hiatal hernia.

## 2020-03-07 ENCOUNTER — Other Ambulatory Visit: Payer: Self-pay | Admitting: Family Medicine

## 2020-03-13 ENCOUNTER — Encounter: Payer: Self-pay | Admitting: Family Medicine

## 2020-03-15 ENCOUNTER — Ambulatory Visit: Payer: PPO

## 2020-03-15 ENCOUNTER — Ambulatory Visit (INDEPENDENT_AMBULATORY_CARE_PROVIDER_SITE_OTHER): Payer: PPO | Admitting: Family Medicine

## 2020-03-15 DIAGNOSIS — Z0000 Encounter for general adult medical examination without abnormal findings: Secondary | ICD-10-CM

## 2020-03-15 DIAGNOSIS — Z1231 Encounter for screening mammogram for malignant neoplasm of breast: Secondary | ICD-10-CM | POA: Diagnosis not present

## 2020-03-15 NOTE — Progress Notes (Signed)
MEDICARE ANNUAL WELLNESS VISIT  03/15/2020  Telephone Visit Disclaimer This Medicare AWV was conducted by telephone due to national recommendations for restrictions regarding the COVID-19 Pandemic (e.g. social distancing).  I verified, using two identifiers, that I am speaking with Anna Marquez or their authorized healthcare agent. I discussed the limitations, risks, security, and privacy concerns of performing an evaluation and management service by telephone and the potential availability of an in-person appointment in the future. The patient expressed understanding and agreed to proceed.  Location of Patient: Home Location of Provider (nurse):  In the office.  Subjective:    Anna Marquez is a 70 y.o. female patient of Anna Nutting, DO who had a Medicare Annual Wellness Visit today via telephone. Cassey is Retired and lives alone. she has 2 children. she reports that she is socially active and does interact with friends/family regularly. she is moderately physically active and enjoys gardening, croteching, crafting, reading and making wreaths.  Patient Care Team: Anna Nutting, DO as PCP - General (Family Medicine) Nahser, Wonda Cheng, MD as PCP - Cardiology (Cardiology) Nahser, Wonda Cheng, MD (Cardiology)  Advanced Directives 03/15/2020 02/07/2020 07/26/2019 02/11/2019 01/15/2019 12/22/2018 11/05/2018  Does Patient Have a Medical Advance Directive? Yes Yes Yes Yes Yes Yes No  Type of Advance Directive Living will Living will Living will [place];Living will [place];Living will -  Does patient want to make changes to medical advance directive? No - Patient declined - No - Patient declined No - Patient declined - No - Patient declined -  Copy of [place] in Chart? - - - No - copy requested No - copy requested No - copy requested -  Would patient like information on creating a medical advance directive? - -  - - No - Guardian declined - No - Guardian declined    Hospital Utilization Over the Past 12 Months: # of hospitalizations or ER visits: 2 # of surgeries: 1  Review of Systems    Patient reports that her overall health is unchanged compared to last year.  History obtained from chart review and the patient  Patient Reported Readings (BP, Pulse, CBG, Weight, etc) BP: 117/80 Pulse: 76 Temperature: 99 SpO2: 99%   Pain Assessment Pain : No/denies pain     Current Medications & Allergies (verified) Allergies as of 03/15/2020      Reactions   Erythromycin    Upset stomach   Nsaids Other (See Comments)   Has a history of bleeding ulcers, tolerates aspirin    Penicillins Hives, Shortness Of Breath   Did it involve swelling of the face/tongue/throat, SOB, or low BP? Yes Did it involve sudden or severe rash/hives, skin peeling, or any reaction on the inside of your mouth or nose? No Did you need to seek medical attention at a hospital or doctor's office? No When did it last happen?45 years If all above answers are "NO", may proceed with cephalosporin use.      Medication List       Accurate as of March 15, 2020 11:26 AM. If you have any questions, ask your nurse or doctor.        acetaminophen 500 MG tablet Commonly known as: TYLENOL Take 1,000 mg by mouth every 6 (six) hours as needed for moderate pain or headache.   Eliquis 5 MG Tabs tablet Generic drug: apixaban TAKE 1 TABLET BY MOUTH 2 TIMES DAILY.   ferrous sulfate 325 (65  FE) MG tablet Take 325 mg by mouth every other day.   fluticasone 50 MCG/ACT nasal spray Commonly known as: FLONASE PLACE 1 SPRAY INTO BOTH NOSTRILS AS NEEDED FOR ALLERGIES OR RHINITIS.   furosemide 20 MG tablet Commonly known as: LASIX TAKE 1 TABLET (20 MG TOTAL) BY MOUTH DAILY AS NEEDED FOR EDEMA.   lisinopril 20 MG tablet Commonly known as: ZESTRIL Take 1 tablet (20 mg total) by mouth daily.   loratadine 10 MG tablet Commonly  known as: CLARITIN Take 10 mg by mouth daily.   nadolol 40 MG tablet Commonly known as: Corgard Take 1.5 tablets (60 mg total) by mouth every morning AND 1 tablet (40 mg total) every evening.   pantoprazole 40 MG tablet Commonly known as: PROTONIX TAKE 1 TABLET (40 MG TOTAL) BY MOUTH DAILY.   potassium chloride 10 MEQ tablet Commonly known as: KLOR-CON TAKE 1 TABLET BY MOUTH DAILY AS NEEDED (TAKE WITH HCTZ).   ProAir HFA 108 (90 Base) MCG/ACT inhaler Generic drug: albuterol INHALE 1 TO 2 PUFFS INTO THE LUNGS EVERY 6 HOURS AS NEEDED FOR WHEEZING OR SHORTNESS OF BREATH.   PROBIOTIC PO Take 1 tablet by mouth daily.   vitamin C 1000 MG tablet Take 1,000 mg by mouth daily.   Vitamin D 50 MCG (2000 UT) tablet Take 2,000 Units by mouth daily.   vitamin E 180 MG (400 UNITS) capsule Take 400 Units by mouth daily.   Voltaren 1 % Gel Generic drug: diclofenac Sodium Apply topically 4 (four) times daily.   ZINC CITRATE PO Take by mouth.       History (reviewed): Past Medical History:  Diagnosis Date  . Adenomatous colon polyp 2003  . Cholelithiasis   . COVID   . Diverticulosis of colon   . Gastric polyps   . GERD (gastroesophageal reflux disease)   . Helicobacter pylori (H. pylori)   . Hiatal hernia   . Hypertension   . Mitral valve prolapse   . Nephrolithiasis   . Osteoarthritis   . Osteopenia 03/2017   T score -1.5 FRAX 8.9% / 0.9%.  Stable from prior DEXA 2016  . Persistent atrial fibrillation ([place])   . TGA (transient global amnesia)    Past Surgical History:  Procedure Laterality Date  . ANTERIOR AND POSTERIOR REPAIR N/A 06/14/2014   Procedure: ANTERIOR (CYSTOCELE) AND POSTERIOR REPAIR (RECTOCELE);  Surgeon: Anastasio Auerbach, MD;  Location: [facility];  Service: Gynecology;  Laterality: N/A;  . ATRIAL FIBRILLATION ABLATION N/A 02/11/2019   Procedure: ATRIAL FIBRILLATION ABLATION;  Surgeon: Thompson Grayer, MD;  Location: [facility];  Service: Cardiovascular;   Laterality: N/A;  . CARDIOVERSION N/A 01/15/2019   Procedure: CARDIOVERSION;  Surgeon: Fay Records, MD;  Location: [facility];  Service: Cardiovascular;  Laterality: N/A;  . CHOLECYSTECTOMY    . kidney stone removal     x2  . VAGINAL HYSTERECTOMY N/A 06/14/2014   Procedure: HYSTERECTOMY VAGINAL;  Surgeon: Anastasio Auerbach, MD;  Location: [facility];  Service: Gynecology;  Laterality: N/A;   Family History  Problem Relation Age of Onset  . Cancer Father        prostate  . Breast cancer Paternal Aunt 15  . Diabetes Maternal Grandmother   . Heart disease Maternal Grandmother   . Heart disease Maternal Grandfather   . Melanoma Paternal Aunt   . Neuropathy Mother   . Congestive Heart Failure Mother   . Thyroid disease Daughter    Social History   Socioeconomic History  .  Marital status: Divorced    Spouse name: Not on file  . Number of children: 2  . Years of education: 12th grade  . Highest education level: High school graduate  Occupational History  . Occupation: Retired,  Tobacco Use  . Smoking status: Never Smoker  . Smokeless tobacco: Never Used  Vaping Use  . Vaping Use: Never used  Substance and Sexual Activity  . Alcohol use: Never    Alcohol/week: 0.0 standard drinks  . Drug use: No  . Sexual activity: Yes    Birth control/protection: Post-menopausal    Comment: 1st intercourse 70 yo-Fewer than 5 partners  Other Topics Concern  . Not on file  Social History Narrative   Lives along with her dog. She enjoys croteching, crafts, making wreaths, reading and gardening in her free time.    Social Determinants of Health   Financial Resource Strain: Low Risk   . Difficulty of Paying Living Expenses: Not hard at all  Food Insecurity: No Food Insecurity  . Worried About Charity fundraiser in the Last Year: Never true  . Ran Out of Food in the Last Year: Never true  Transportation Needs: No Transportation Needs  . Lack of Transportation (Medical): No  . Lack of  Transportation (Non-Medical): No  Physical Activity: Sufficiently Active  . Days of Exercise per Week: 7 days  . Minutes of Exercise per Session: 60 min  Stress: No Stress Concern Present  . Feeling of Stress : Not at all  Social Connections: Moderately Isolated  . Frequency of Communication with Friends and Family: More than three times a week  . Frequency of Social Gatherings with Friends and Family: More than three times a week  . Attends Religious Services: More than 4 times per year  . Active Member of Clubs or Organizations: No  . Attends Archivist Meetings: Never  . Marital Status: Divorced    Activities of Daily Living In your present state of health, do you have any difficulty performing the following activities: 03/15/2020  Hearing? N  Vision? N  Difficulty concentrating or making decisions? N  Walking or climbing stairs? Y  Comment knee problems; uses a wrap which helps her.  Dressing or bathing? N  Doing errands, shopping? N  Preparing Food and eating ? N  Using the Toilet? N  In the past six months, have you accidently leaked urine? Y  Comment once in a while.  Do you have problems with loss of bowel control? N  Managing your Medications? N  Managing your Finances? N  Housekeeping or managing your Housekeeping? N  Some recent data might be hidden    Patient Education/ Literacy How often do you need to have someone help you when you read instructions, pamphlets, or other written materials from your doctor or pharmacy?: 1 - Never What is the last grade level you completed in school?: 12th grade  Exercise Current Exercise Habits: Home exercise routine, Type of exercise: walking, Time (Minutes): 60, Frequency (Times/Week): 7, Weekly Exercise (Minutes/Week): 420, Intensity: Moderate, Exercise limited by: None identified  Diet Patient reports consuming 3 meals a day and 1 snack(s) a day Patient reports that her primary diet is: Regular Patient reports that  she does have regular access to food.   Depression Screen PHQ 2/9 Scores 03/15/2020 02/07/2020 02/02/2018 11/21/2016 12/13/2015 05/02/2014  PHQ - 2 Score 0 0 0 0 0 0  PHQ- 9 Score 0 1 - - - -     Fall Risk  Fall Risk  03/15/2020 12/13/2015 05/02/2014  Falls in the past year? 1 No No  Number falls in past yr: 0 - -  Injury with Fall? 0 - -  Risk for fall due to : No Fall Risks - -  Follow up Falls evaluation completed - -     Objective:  Anna Marquez seemed alert and oriented and she participated appropriately during our telephone visit.  Blood Pressure Weight BMI  BP Readings from Last 3 Encounters:  02/07/20 (!) 160/83  12/06/19 122/86  08/31/19 122/82   Wt Readings from Last 3 Encounters:  02/07/20 173 lb 6.4 oz (78.7 kg)  01/04/20 160 lb (72.6 kg)  12/06/19 172 lb (78 kg)   BMI Readings from Last 1 Encounters:  02/07/20 29.76 kg/m    *Unable to obtain current vital signs, weight, and BMI due to telephone visit type  Hearing/Vision  . Denaisha did not seem to have difficulty with hearing/understanding during the telephone conversation . Reports that she has not had a formal eye exam by an eye care professional within the past year . Reports that she has not had a formal hearing evaluation within the past year *Unable to fully assess hearing and vision during telephone visit type  Cognitive Function: 6CIT Screen 03/15/2020  What Year? 0 points  What month? 0 points  What time? 0 points  Count back from 20 0 points  Months in reverse 0 points  Repeat phrase 0 points  Total Score 0   (Normal:0-7, Significant for Dysfunction: >8)  Normal Cognitive Function Screening: Yes   Immunization & Health Maintenance Record Immunization History  Administered Date(s) Administered  . Fluad Quad(high Dose 65+) 11/04/2018, 11/10/2019  . Influenza Whole 10/07/2009  . Influenza, High Dose Seasonal PF 09/12/2015, 10/25/2016, 10/28/2017  . Influenza, Seasonal, Injecte, Preservative Fre  01/09/2012  . Influenza-Unspecified 10/24/2016  . PFIZER(Purple Top)SARS-COV-2 Vaccination 03/04/2019, 03/24/2019  . Pneumococcal Conjugate-13 05/02/2014  . Pneumococcal Polysaccharide-23 09/12/2015  . Td 01/08/2007, 10/28/2017  . Zoster 09/12/2015    Health Maintenance  Topic Date Due  . COVID-19 Vaccine (3 - Booster for Pfizer series) 03/31/2020 (Originally 09/24/2019)  . MAMMOGRAM  07/20/2021  . COLONOSCOPY (Pts 45-34yrs Insurance coverage will need to be confirmed)  03/13/2023  . TETANUS/TDAP  10/29/2027  . INFLUENZA VACCINE  Completed  . DEXA SCAN  Completed  . Hepatitis C Screening  Completed  . PNA vac Low Risk Adult  Completed  . HPV VACCINES  Aged Out       Assessment  This is a routine wellness examination for HULDAH MARIN.  Health Maintenance: Due or Overdue There are no preventive care reminders to display for this patient.  Anna Marquez does not need a referral for Community Assistance: Care Management:   no Social Work:    no Prescription Assistance:  no Nutrition/Diabetes Education:  no   Plan:  Personalized Goals Goals Addressed              This Visit's Progress   .  Patient Stated (pt-stated)        03/15/2020 AWV Goal: Exercise for General Health   Patient will verbalize understanding of the benefits of increased physical activity:  Exercising regularly is important. It will improve your overall fitness, flexibility, and endurance.  Regular exercise also will improve your overall health. It can help you control your weight, reduce stress, and improve your bone density.  Over the next year, patient will increase physical activity as tolerated with  a goal of at least 150 minutes of moderate physical activity per week.   You can tell that you are exercising at a moderate intensity if your heart starts beating faster and you start breathing faster but can still hold a conversation.  Moderate-intensity exercise ideas include:  Walking 1 mile  (1.6 km) in about 15 minutes  Biking  Hiking  Golfing  Dancing  Water aerobics  Patient will verbalize understanding of everyday activities that increase physical activity by providing examples like the following: ? Yard work, such as: ? Pushing a Conservation officer, nature ? Raking and bagging leaves ? Washing your car ? Pushing a stroller ? Shoveling snow ? Gardening ? Washing windows or floors  Patient will be able to explain general safety guidelines for exercising:   Before you start a new exercise program, talk with your health care provider.  Do not exercise so much that you hurt yourself, feel dizzy, or get very short of breath.  Wear comfortable clothes and wear shoes with good support.  Drink plenty of water while you exercise to prevent dehydration or heat stroke.  Work out until your breathing and your heartbeat get faster.       Personalized Health Maintenance & Screening Recommendations  Screening mammography Shingrix  Lung Cancer Screening Recommended: no (Low Dose CT Chest recommended if Age 53-80 years, 30 pack-year currently smoking OR have quit w/in past 15 years) Hepatitis C Screening recommended: no HIV Screening recommended: no  Advanced Directives: Written information was not prepared per patient's request.  Referrals & Orders Orders Placed This Encounter  Procedures  . Mammogram 3D SCREEN BREAST BILATERAL    Follow-up Plan . Follow-up with Anna Nutting, DO as planned . Schedule your Shingrix at  Your pharmacy.  . Mammogram referral sent for later in July this year.  . Medicare wellness in one year.   I have personally reviewed and noted the following in the patient's chart:   . Medical and social history . Use of alcohol, tobacco or illicit drugs  . Current medications and supplements . Functional ability and status . Nutritional status . Physical activity . Advanced directives . List of other physicians . Hospitalizations, surgeries, and  ER visits in previous 12 months . Vitals . Screenings to include cognitive, depression, and falls . Referrals and appointments  In addition, I have reviewed and discussed with Anna Marquez certain preventive protocols, quality metrics, and best practice recommendations. A written personalized care plan for preventive services as well as general preventive health recommendations is available and can be mailed to the patient at her request.      Tinnie Gens, RN  03/15/2020

## 2020-03-15 NOTE — Patient Instructions (Addendum)
Health Maintenance, Female Adopting a healthy lifestyle and getting preventive care are important in promoting health and wellness. Ask your health care provider about:  The right schedule for you to have regular tests and exams.  Things you can do on your own to prevent diseases and keep yourself healthy. What should I know about diet, weight, and exercise? Eat a healthy diet  Eat a diet that includes plenty of vegetables, fruits, low-fat dairy products, and lean protein.  Do not eat a lot of foods that are high in solid fats, added sugars, or sodium.   Maintain a healthy weight Body mass index (BMI) is used to identify weight problems. It estimates body fat based on height and weight. Your health care provider can help determine your BMI and help you achieve or maintain a healthy weight. Get regular exercise Get regular exercise. This is one of the most important things you can do for your health. Most adults should:  Exercise for at least 150 minutes each week. The exercise should increase your heart rate and make you sweat (moderate-intensity exercise).  Do strengthening exercises at least twice a week. This is in addition to the moderate-intensity exercise.  Spend less time sitting. Even light physical activity can be beneficial. Watch cholesterol and blood lipids Have your blood tested for lipids and cholesterol at 70 years of age, then have this test every 5 years. Have your cholesterol levels checked more often if:  Your lipid or cholesterol levels are high.  You are older than 71 years of age.  You are at high risk for heart disease. What should I know about cancer screening? Depending on your health history and family history, you may need to have cancer screening at various ages. This may include screening for:  Breast cancer.  Cervical cancer.  Colorectal cancer.  Skin cancer.  Lung cancer. What should I know about heart disease, diabetes, and high blood  pressure? Blood pressure and heart disease  High blood pressure causes heart disease and increases the risk of stroke. This is more likely to develop in people who have high blood pressure readings, are of African descent, or are overweight.  Have your blood pressure checked: ? Every 3-5 years if you are 18-39 years of age. ? Every year if you are 40 years old or older. Diabetes Have regular diabetes screenings. This checks your fasting blood sugar level. Have the screening done:  Once every three years after age 40 if you are at a normal weight and have a low risk for diabetes.  More often and at a younger age if you are overweight or have a high risk for diabetes. What should I know about preventing infection? Hepatitis B If you have a higher risk for hepatitis B, you should be screened for this virus. Talk with your health care provider to find out if you are at risk for hepatitis B infection. Hepatitis C Testing is recommended for:  Everyone born from 1945 through 1965.  Anyone with known risk factors for hepatitis C. Sexually transmitted infections (STIs)  Get screened for STIs, including gonorrhea and chlamydia, if: ? You are sexually active and are younger than 71 years of age. ? You are older than 71 years of age and your health care provider tells you that you are at risk for this type of infection. ? Your sexual activity has changed since you were last screened, and you are at increased risk for chlamydia or gonorrhea. Ask your health care provider   if you are at risk.  Ask your health care provider about whether you are at high risk for HIV. Your health care provider may recommend a prescription medicine to help prevent HIV infection. If you choose to take medicine to prevent HIV, you should first get tested for HIV. You should then be tested every 3 months for as long as you are taking the medicine. Pregnancy  If you are about to stop having your period (premenopausal) and  you may become pregnant, seek counseling before you get pregnant.  Take 400 to 800 micrograms (mcg) of folic acid every day if you become pregnant.  Ask for birth control (contraception) if you want to prevent pregnancy. Osteoporosis and menopause Osteoporosis is a disease in which the bones lose minerals and strength with aging. This can result in bone fractures. If you are 70 years old or older, or if you are at risk for osteoporosis and fractures, ask your health care provider if you should:  Be screened for bone loss.  Take a calcium or vitamin D supplement to lower your risk of fractures.  Be given hormone replacement therapy (HRT) to treat symptoms of menopause. Follow these instructions at home: Lifestyle  Do not use any products that contain nicotine or tobacco, such as cigarettes, e-cigarettes, and chewing tobacco. If you need help quitting, ask your health care provider.  Do not use street drugs.  Do not share needles.  Ask your health care provider for help if you need support or information about quitting drugs. Alcohol use  Do not drink alcohol if: ? Your health care provider tells you not to drink. ? You are pregnant, may be pregnant, or are planning to become pregnant.  If you drink alcohol: ? Limit how much you use to 0-1 drink a day. ? Limit intake if you are breastfeeding.  Be aware of how much alcohol is in your drink. In the [place] one drink equals one 12 oz bottle of beer (355 mL), one 5 oz glass of wine (148 mL), or one 1 oz glass of hard liquor (44 mL). General instructions  Schedule regular health, dental, and eye exams.  Stay current with your vaccines.  Tell your health care provider if: ? You often feel depressed. ? You have ever been abused or do not feel safe at home. Summary  Adopting a healthy lifestyle and getting preventive care are important in promoting health and wellness.  Follow your health care provider's instructions about healthy  diet, exercising, and getting tested or screened for diseases.  Follow your health care provider's instructions on monitoring your cholesterol and blood pressure. This information is not intended to replace advice given to you by your health care provider. Make sure you discuss any questions you have with your health care provider. Document Revised: 12/17/2017 Document Reviewed: 12/17/2017 Elsevier Patient Education  2021 [employer] Calories are units of energy. Your body needs a certain number of calories from food to keep going throughout the day. When you eat or drink more calories than your body needs, your body stores the extra calories mostly as fat. When you eat or drink fewer calories than your body needs, your body burns fat to get the energy it needs. Calorie counting means keeping track of how many calories you eat and drink each day. Calorie counting can be helpful if you need to lose weight. If you eat fewer calories than your body needs, you should lose weight.  Ask your health care provider what a healthy weight is for you. For calorie counting to work, you will need to eat the right number of calories each day to lose a healthy amount of weight per week. A dietitian can help you figure out how many calories you need in a day and will suggest ways to reach your calorie goal.  A healthy amount of weight to lose each week is usually 1-2 lb (0.5-0.9 kg). This usually means that your daily calorie intake should be reduced by 500-750 calories.  Eating 1,200-1,500 calories a day can help most women lose weight.  Eating 1,500-1,800 calories a day can help most men lose weight. What do I need to know about calorie counting? Work with your health care provider or dietitian to determine how many calories you should get each day. To meet your daily calorie goal, you will need to:  Find out how many calories are in each food that you would like to eat.  Try to do this before you eat.  Decide how much of the food you plan to eat.  Keep a food log. Do this by writing down what you ate and how many calories it had. To successfully lose weight, it is important to balance calorie counting with a healthy lifestyle that includes regular activity. Where do I find calorie information? The number of calories in a food can be found on a Nutrition Facts label. If a food does not have a Nutrition Facts label, try to look up the calories online or ask your dietitian for help. Remember that calories are listed per serving. If you choose to have more than one serving of a food, you will have to multiply the calories per serving by the number of servings you plan to eat. For example, the label on a package of bread might say that a serving size is 1 slice and that there are 90 calories in a serving. If you eat 1 slice, you will have eaten 90 calories. If you eat 2 slices, you will have eaten 180 calories.   How do I keep a food log? After each time that you eat, record the following in your food log as soon as possible:  What you ate. Be sure to include toppings, sauces, and other extras on the food.  How much you ate. This can be measured in cups, ounces, or number of items.  How many calories were in each food and drink.  The total number of calories in the food you ate. Keep your food log near you, such as in a pocket-sized notebook or on an app or website on your mobile phone. Some programs will calculate calories for you and show you how many calories you have left to meet your daily goal. What are some portion-control tips?  Know how many calories are in a serving. This will help you know how many servings you can have of a certain food.  Use a measuring cup to measure serving sizes. You could also try weighing out portions on a kitchen scale. With time, you will be able to estimate serving sizes for some foods.  Take time to put servings of  different foods on your favorite plates or in your favorite bowls and cups so you know what a serving looks like.  Try not to eat straight from a food's packaging, such as from a bag or box. Eating straight from the package makes it hard to see how much you are  eating and can lead to overeating. Put the amount you would like to eat in a cup or on a plate to make sure you are eating the right portion.  Use smaller plates, glasses, and bowls for smaller portions and to prevent overeating.  Try not to multitask. For example, avoid watching TV or using your computer while eating. If it is time to eat, sit down at a table and enjoy your food. This will help you recognize when you are full. It will also help you be more mindful of what and how much you are eating. What are tips for following this plan? Reading food labels  Check the calorie count compared with the serving size. The serving size may be smaller than what you are used to eating.  Check the source of the calories. Try to choose foods that are high in protein, fiber, and vitamins, and low in saturated fat, trans fat, and sodium. Shopping  Read nutrition labels while you shop. This will help you make healthy decisions about which foods to buy.  Pay attention to nutrition labels for low-fat or fat-free foods. These foods sometimes have the same number of calories or more calories than the full-fat versions. They also often have added sugar, starch, or salt to make up for flavor that was removed with the fat.  Make a grocery list of lower-calorie foods and stick to it. Cooking  Try to cook your favorite foods in a healthier way. For example, try baking instead of frying.  Use low-fat dairy products. Meal planning  Use more fruits and vegetables. One-half of your plate should be fruits and vegetables.  Include lean proteins, such as chicken, [place], and fish. Lifestyle Each week, aim to do one of the following:  150 minutes of  moderate exercise, such as walking.  75 minutes of vigorous exercise, such as running. General information  Know how many calories are in the foods you eat most often. This will help you calculate calorie counts faster.  Find a way of tracking calories that works for you. Get creative. Try different apps or programs if writing down calories does not work for you. What foods should I eat?  Eat nutritious foods. It is better to have a nutritious, high-calorie food, such as an avocado, than a food with few nutrients, such as a bag of potato chips.  Use your calories on foods and drinks that will fill you up and will not leave you hungry soon after eating. ? Examples of foods that fill you up are nuts and nut butters, vegetables, lean proteins, and high-fiber foods such as whole grains. High-fiber foods are foods with more than 5 g of fiber per serving.  Pay attention to calories in drinks. Low-calorie drinks include water and unsweetened drinks. The items listed above may not be a complete list of foods and beverages you can eat. Contact a dietitian for more information.   What foods should I limit? Limit foods or drinks that are not good sources of vitamins, minerals, or protein or that are high in unhealthy fats. These include:  Candy.  Other sweets.  Sodas, specialty coffee drinks, alcohol, and juice. The items listed above may not be a complete list of foods and beverages you should avoid. Contact a dietitian for more information. How do I count calories when eating out?  Pay attention to portions. Often, portions are much larger when eating out. Try these tips to keep portions smaller: ? Consider sharing a meal instead  of getting your own. ? If you get your own meal, eat only half of it. Before you start eating, ask for a container and put half of your meal into it. ? When available, consider ordering smaller portions from the menu instead of full portions.  Pay attention to your  food and drink choices. Knowing the way food is cooked and what is included with the meal can help you eat fewer calories. ? If calories are listed on the menu, choose the lower-calorie options. ? Choose dishes that include vegetables, fruits, whole grains, low-fat dairy products, and lean proteins. ? Choose items that are boiled, broiled, grilled, or steamed. Avoid items that are buttered, battered, fried, or served with cream sauce. Items labeled as crispy are usually fried, unless stated otherwise. ? Choose water, low-fat milk, unsweetened iced tea, or other drinks without added sugar. If you want an alcoholic beverage, choose a lower-calorie option, such as a glass of wine or light beer. ? Ask for dressings, sauces, and syrups on the side. These are usually high in calories, so you should limit the amount you eat. ? If you want a salad, choose a garden salad and ask for grilled meats. Avoid extra toppings such as bacon, cheese, or fried items. Ask for the dressing on the side, or ask for olive oil and vinegar or lemon to use as dressing.  Estimate how many servings of a food you are given. Knowing serving sizes will help you be aware of how much food you are eating at restaurants. Where to find more information  Centers for Disease Control and Prevention: http://www.wolf.info/  [place] Department of Agriculture: http://www.wilson-mendoza.org/ Summary  Calorie counting means keeping track of how many calories you eat and drink each day. If you eat fewer calories than your body needs, you should lose weight.  A healthy amount of weight to lose per week is usually 1-2 lb (0.5-0.9 kg). This usually means reducing your daily calorie intake by 500-750 calories.  The number of calories in a food can be found on a Nutrition Facts label. If a food does not have a Nutrition Facts label, try to look up the calories online or ask your dietitian for help.  Use smaller plates, glasses, and bowls for smaller portions and to prevent  overeating.  Use your calories on foods and drinks that will fill you up and not leave you hungry shortly after a meal. This information is not intended to replace advice given to you by your health care provider. Make sure you discuss any questions you have with your health care provider. Document Revised: 02/04/2019 Document Reviewed: 02/04/2019 Elsevier Patient Education  2021 [place] Maintenance Summary and Written Plan of Care  Ms. Anna Marquez ,  Thank you for allowing me to perform your Medicare Annual Wellness Visit and for your ongoing commitment to your health.   Health Maintenance & Immunization History Health Maintenance  Topic Date Due  . COVID-19 Vaccine (3 - Booster for Pfizer series) 03/31/2020 (Originally 09/24/2019)  . MAMMOGRAM  07/20/2021  . COLONOSCOPY (Pts 45-74yrs Insurance coverage will need to be confirmed)  03/13/2023  . TETANUS/TDAP  10/29/2027  . INFLUENZA VACCINE  Completed  . DEXA SCAN  Completed  . Hepatitis C Screening  Completed  . PNA vac Low Risk Adult  Completed  . HPV VACCINES  Aged Out   Immunization History  Administered Date(s) Administered  . Fluad Quad(high Dose 65+) 11/04/2018, 11/10/2019  .  Influenza Whole 10/07/2009  . Influenza, High Dose Seasonal PF 09/12/2015, 10/25/2016, 10/28/2017  . Influenza, Seasonal, Injecte, Preservative Fre 01/09/2012  . Influenza-Unspecified 10/24/2016  . PFIZER(Purple Top)SARS-COV-2 Vaccination 03/04/2019, 03/24/2019  . Pneumococcal Conjugate-13 05/02/2014  . Pneumococcal Polysaccharide-23 09/12/2015  . Td 01/08/2007, 10/28/2017  . Zoster 09/12/2015    These are the patient goals that we discussed: Goals Addressed              This Visit's Progress   .  Patient Stated (pt-stated)        03/15/2020 AWV Goal: Exercise for General Health   Patient will verbalize understanding of the benefits of increased physical activity:  Exercising regularly is  important. It will improve your overall fitness, flexibility, and endurance.  Regular exercise also will improve your overall health. It can help you control your weight, reduce stress, and improve your bone density.  Over the next year, patient will increase physical activity as tolerated with a goal of at least 150 minutes of moderate physical activity per week.   You can tell that you are exercising at a moderate intensity if your heart starts beating faster and you start breathing faster but can still hold a conversation.  Moderate-intensity exercise ideas include:  Walking 1 mile (1.6 km) in about 15 minutes  Biking  Hiking  Golfing  Dancing  Water aerobics  Patient will verbalize understanding of everyday activities that increase physical activity by providing examples like the following: ? Yard work, such as: ? Pushing a Conservation officer, nature ? Raking and bagging leaves ? Washing your car ? Pushing a stroller ? Shoveling snow ? Gardening ? Washing windows or floors  Patient will be able to explain general safety guidelines for exercising:   Before you start a new exercise program, talk with your health care provider.  Do not exercise so much that you hurt yourself, feel dizzy, or get very short of breath.  Wear comfortable clothes and wear shoes with good support.  Drink plenty of water while you exercise to prevent dehydration or heat stroke.  Work out until your breathing and your heartbeat get faster.         This is a list of Health Maintenance Items that are overdue or due now: Screening mammography - In July Shingrix  Orders/Referrals Placed Today: Orders Placed This Encounter  Procedures  . Mammogram 3D SCREEN BREAST BILATERAL    Standing Status:   Future    Standing Expiration Date:   03/15/2021    Scheduling Instructions:     Please call the patient to schedule. She will be due after July 20, 2020    Order Specific Question:   Reason for Exam (SYMPTOM  OR  DIAGNOSIS REQUIRED)    Answer:   Breast Cancer Screen    Order Specific Question:   Preferred imaging location?    Answer:   [facility]    Order Specific Question:   Release to patient    Answer:   Immediate   (Contact our referral department at [phone] if you have not spoken with someone about your referral appointment within the next 5 days)    Follow-up Plan . Follow-up with [name], DO as planned . Schedule your Shingrix at  Your pharmacy.  . Mammogram referral sent for later in July this year.  . Medicare wellness in one year.

## 2020-04-10 ENCOUNTER — Other Ambulatory Visit (HOSPITAL_COMMUNITY): Payer: Self-pay | Admitting: Nurse Practitioner

## 2020-04-11 ENCOUNTER — Telehealth (INDEPENDENT_AMBULATORY_CARE_PROVIDER_SITE_OTHER): Payer: PPO | Admitting: Family Medicine

## 2020-04-11 ENCOUNTER — Encounter: Payer: Self-pay | Admitting: Family Medicine

## 2020-04-11 DIAGNOSIS — J40 Bronchitis, not specified as acute or chronic: Secondary | ICD-10-CM | POA: Diagnosis not present

## 2020-04-11 DIAGNOSIS — J329 Chronic sinusitis, unspecified: Secondary | ICD-10-CM | POA: Diagnosis not present

## 2020-04-11 MED ORDER — DOXYCYCLINE HYCLATE 100 MG PO TABS: 100.0000 mg | ORAL_TABLET | Freq: Two times a day (BID) | ORAL | 0 refills | Status: DC

## 2020-04-11 MED ORDER — HYDROCODONE-HOMATROPINE 5-1.5 MG/5ML PO SYRP: 5.0000 mL | ORAL_SOLUTION | Freq: Three times a day (TID) | ORAL | 0 refills | Status: DC | PRN

## 2020-04-11 MED ORDER — PREDNISONE 20 MG PO TABS
20.0000 mg | ORAL_TABLET | Freq: Two times a day (BID) | ORAL | 0 refills | Status: AC
Start: 2020-04-11 — End: 2020-04-16

## 2020-04-11 NOTE — Progress Notes (Signed)
Anna Marquez - 70 y.o. female MRN [id number]  Date of birth: [birth year]   This visit type was conducted due to national recommendations for restrictions regarding the COVID-19 Pandemic (e.g. social distancing).  This format is felt to be most appropriate for this patient at this time.  All issues noted in this document were discussed and addressed.  No physical exam was performed (except for noted visual exam findings with Video Visits).  I discussed the limitations of evaluation and management by telemedicine and the availability of in person appointments. The patient expressed understanding and agreed to proceed.  I connected with@ on 04/11/20 at 11:30 AM EDT by a video enabled telemedicine application and verified that I am speaking with the correct person using two identifiers.  Interactive audio and video telecommunications were attempted between this provider and patient, however failed, due to patient having technical difficulties OR patient did not have access to video capability.  We continued and completed visit with audio only.    Present at visit: [name], DO [name]   Patient Location: [place] [address]   Provider location:   [place]  No chief complaint on file.   HPI  Anna Marquez is a 70 y.o. female who presents via audio/video conferencing for a telehealth visit today.  She has complaint of dry croupy cough, wheezing, mild fever, and congestion.  Symptoms started 10 days ago.  She denies chest pain or shortness of breath. Her grandson had similar symptoms prior to onset of her symptoms.  She and her grandson have had negative COVID antigen tests.  She has had good response to albuterol inhaler for relief of wheezing.     ROS:  A comprehensive ROS was completed and negative except as noted per HPI  Past Medical History:  Diagnosis Date  . Adenomatous colon polyp 2003  . Cholelithiasis   . COVID   . Diverticulosis of colon   .  Gastric polyps   . GERD (gastroesophageal reflux disease)   . Helicobacter pylori (H. pylori)   . Hiatal hernia   . Hypertension   . Mitral valve prolapse   . Nephrolithiasis   . Osteoarthritis   . Osteopenia 03/2017   T score -1.5 FRAX 8.9% / 0.9%.  Stable from prior DEXA 2016  . Persistent atrial fibrillation ([place])   . TGA (transient global amnesia)     Past Surgical History:  Procedure Laterality Date  . ANTERIOR AND POSTERIOR REPAIR N/A 06/14/2014   Procedure: ANTERIOR (CYSTOCELE) AND POSTERIOR REPAIR (RECTOCELE);  Surgeon: Anastasio Auerbach, MD;  Location: [facility];  Service: Gynecology;  Laterality: N/A;  . ATRIAL FIBRILLATION ABLATION N/A 02/11/2019   Procedure: ATRIAL FIBRILLATION ABLATION;  Surgeon: Thompson Grayer, MD;  Location: [facility];  Service: Cardiovascular;  Laterality: N/A;  . CARDIOVERSION N/A 01/15/2019   Procedure: CARDIOVERSION;  Surgeon: Fay Records, MD;  Location: [facility];  Service: Cardiovascular;  Laterality: N/A;  . CHOLECYSTECTOMY    . kidney stone removal     x2  . VAGINAL HYSTERECTOMY N/A 06/14/2014   Procedure: HYSTERECTOMY VAGINAL;  Surgeon: Anastasio Auerbach, MD;  Location: [facility];  Service: Gynecology;  Laterality: N/A;    Family History  Problem Relation Age of Onset  . Cancer Father        prostate  . Breast cancer Paternal Aunt 29  . Diabetes Maternal Grandmother   . Heart disease Maternal Grandmother   . Heart disease Maternal Grandfather   .  Melanoma Paternal Aunt   . Neuropathy Mother   . Congestive Heart Failure Mother   . Thyroid disease Daughter     Social History   Socioeconomic History  . Marital status: Divorced    Spouse name: Not on file  . Number of children: 2  . Years of education: 12th grade  . Highest education level: High school graduate  Occupational History  . Occupation: Retired,  Tobacco Use  . Smoking status: Never Smoker  . Smokeless tobacco: Never Used  Vaping Use  . Vaping Use: Never used   Substance and Sexual Activity  . Alcohol use: Never    Alcohol/week: 0.0 standard drinks  . Drug use: No  . Sexual activity: Yes    Birth control/protection: Post-menopausal    Comment: 1st intercourse 70 yo-Fewer than 5 partners  Other Topics Concern  . Not on file  Social History Narrative   Lives along with her dog. She enjoys croteching, crafts, making wreaths, reading and gardening in her free time.    Social Determinants of Health   Financial Resource Strain: Low Risk   . Difficulty of Paying Living Expenses: Not hard at all  Food Insecurity: No Food Insecurity  . Worried About Charity fundraiser in the Last Year: Never true  . Ran Out of Food in the Last Year: Never true  Transportation Needs: No Transportation Needs  . Lack of Transportation (Medical): No  . Lack of Transportation (Non-Medical): No  Physical Activity: Sufficiently Active  . Days of Exercise per Week: 7 days  . Minutes of Exercise per Session: 60 min  Stress: No Stress Concern Present  . Feeling of Stress : Not at all  Social Connections: Moderately Isolated  . Frequency of Communication with Friends and Family: More than three times a week  . Frequency of Social Gatherings with Friends and Family: More than three times a week  . Attends Religious Services: More than 4 times per year  . Active Member of Clubs or Organizations: No  . Attends Archivist Meetings: Never  . Marital Status: Divorced  Human resources officer Violence: Not At Risk  . Fear of Current or Ex-Partner: No  . Emotionally Abused: No  . Physically Abused: No  . Sexually Abused: No     Current Outpatient Medications:  .  acetaminophen (TYLENOL) 500 MG tablet, Take 1,000 mg by mouth every 6 (six) hours as needed for moderate pain or headache., Disp: , Rfl:  .  Ascorbic Acid (VITAMIN C) 1000 MG tablet, Take 1,000 mg by mouth daily., Disp: , Rfl:  .  Cholecalciferol (VITAMIN D) 50 MCG (2000 UT) tablet, Take 2,000 Units by mouth  daily., Disp: , Rfl:  .  diclofenac Sodium (VOLTAREN) 1 % GEL, Apply topically 4 (four) times daily., Disp: , Rfl:  .  doxycycline (VIBRA-TABS) 100 MG tablet, Take 1 tablet (100 mg total) by mouth 2 (two) times daily., Disp: 20 tablet, Rfl: 0 .  ELIQUIS 5 MG TABS tablet, TAKE 1 TABLET BY MOUTH 2 TIMES DAILY., Disp: 60 tablet, Rfl: 5 .  ferrous sulfate 325 (65 FE) MG tablet, Take 325 mg by mouth every other day., Disp: , Rfl:  .  fluticasone (FLONASE) 50 MCG/ACT nasal spray, PLACE 1 SPRAY INTO BOTH NOSTRILS AS NEEDED FOR ALLERGIES OR RHINITIS., Disp: 16 g, Rfl: 6 .  furosemide (LASIX) 20 MG tablet, TAKE 1 TABLET (20 MG TOTAL) BY MOUTH DAILY AS NEEDED FOR EDEMA., Disp: 15 tablet, Rfl: 1 .  HYDROcodone-homatropine (  HYCODAN) 5-1.5 MG/5ML syrup, Take 5 mLs by mouth every 8 (eight) hours as needed for cough., Disp: 120 mL, Rfl: 0 .  loratadine (CLARITIN) 10 MG tablet, Take 10 mg by mouth daily. , Disp: , Rfl:  .  nadolol (CORGARD) 40 MG tablet, Take 1.5 tablets (60 mg total) by mouth every morning AND 1 tablet (40 mg total) every evening., Disp: 225 tablet, Rfl: 3 .  pantoprazole (PROTONIX) 40 MG tablet, TAKE 1 TABLET (40 MG TOTAL) BY MOUTH DAILY., Disp: 90 tablet, Rfl: 4 .  potassium chloride (KLOR-CON) 10 MEQ tablet, TAKE 1 TABLET BY MOUTH DAILY AS NEEDED (TAKE WITH HCTZ)., Disp: 30 tablet, Rfl: 6 .  predniSONE (DELTASONE) 20 MG tablet, Take 1 tablet (20 mg total) by mouth 2 (two) times daily with a meal for 5 days., Disp: 10 tablet, Rfl: 0 .  PROAIR HFA 108 (90 Base) MCG/ACT inhaler, INHALE 1 TO 2 PUFFS INTO THE LUNGS EVERY 6 HOURS AS NEEDED FOR WHEEZING OR SHORTNESS OF BREATH., Disp: 8.5 g, Rfl: 3 .  Probiotic Product (PROBIOTIC PO), Take 1 tablet by mouth daily., Disp: , Rfl:  .  vitamin E 180 MG (400 UNITS) capsule, Take 400 Units by mouth daily., Disp: , Rfl:  .  ZINC CITRATE PO, Take by mouth., Disp: , Rfl:  .  lisinopril (ZESTRIL) 20 MG tablet, Take 1 tablet (20 mg total) by mouth daily., Disp:  90 tablet, Rfl: 3  EXAM:  VITALS per patient if applicable: BP 973/53   Pulse 76   Temp 100.1 F (37.8 C)   Wt 168 lb (76.2 kg)   SpO2 97%   BMI 28.84 kg/m   GENERAL: alert, oriented,  in no acute distress  LUNGS:no signs of respiratory distress, barky cough, no obvious gross SOB, gasping or wheezing  PSYCH/NEURO: pleasant and cooperative, no obvious depression or anxiety, speech and thought processing grossly intact  ASSESSMENT AND PLAN:  Discussed the following assessment and plan:  Sinobronchitis She has had prolonged symptoms along with wheezing and harsh cough.  Will start doxycycline and add course of prednisone 40mg  BID x5 days.  Continue albuterol inhaler as needed.  Rx for hycodan as needed for cough.  Instructed to contact cilnic if symptoms worsen or are not improving as anticipated with treatment.       I discussed the assessment and treatment plan with the patient. The patient was provided an opportunity to ask questions and all were answered. The patient agreed with the plan and demonstrated an understanding of the instructions.   The patient was advised to call back or seek an in-person evaluation if the symptoms worsen or if the condition fails to improve as anticipated.   30 minutes spent including pre visit preparation, review of prior notes and labs, encounter with patient via telephone visit and same day documentation.  [name], DO

## 2020-04-11 NOTE — Progress Notes (Signed)
Her grandson (1o days prior) had a fever and cough. She kept him over the weekend. Thursday and Friday started to cough. Saturday started a terrible cough.   No COVID exposure. Grandson tested negative.

## 2020-04-11 NOTE — Assessment & Plan Note (Addendum)
She has had prolonged symptoms along with wheezing and harsh cough.  Will start doxycycline and add course of prednisone 40mg  BID x5 days.  Continue albuterol inhaler as needed.  Rx for hycodan as needed for cough.  Instructed to contact cilnic if symptoms worsen or are not improving as anticipated with treatment.

## 2020-04-18 ENCOUNTER — Encounter: Payer: Self-pay | Admitting: Family Medicine

## 2020-04-25 ENCOUNTER — Other Ambulatory Visit (HOSPITAL_COMMUNITY): Payer: Self-pay | Admitting: Nurse Practitioner

## 2020-06-07 ENCOUNTER — Telehealth: Payer: Self-pay | Admitting: Family Medicine

## 2020-06-07 NOTE — Chronic Care Management (AMB) (Signed)
  Chronic Care Management   Note  06/07/2020 Name: Anna Marquez MRN: [id number] DOB: [birth year]  Anna Marquez is a 70 y.o. year old female who is a primary care patient of [name], DO. I reached out to [name] by phone today in response to a referral sent by [name]'s PCP, [name], DO.   [name] was given information about Chronic Care Management services today including:  1. CCM service includes personalized support from designated clinical staff supervised by her physician, including individualized plan of care and coordination with other care providers 2. 24/7 contact phone numbers for assistance for urgent and routine care needs. 3. Service will only be billed when office clinical staff spend 20 minutes or more in a month to coordinate care. 4. Only one practitioner may furnish and bill the service in a calendar month. 5. The patient may stop CCM services at any time (effective at the end of the month) by phone call to the office staff.   Patient agreed to services and verbal consent obtained.   Follow up plan:   [name] Upstream Scheduler

## 2020-06-07 NOTE — Progress Notes (Signed)
  Chronic Care Management   Outreach Note  06/07/2020 Name: Anna Marquez MRN: [id number] DOB: May 15, 1950  Referred by: [name], DO Reason for referral : No chief complaint on file.   An unsuccessful telephone outreach was attempted today. The patient was referred to the pharmacist for assistance with care management and care coordination.   Follow Up Plan:   [name] Upstream Scheduler

## 2020-06-09 ENCOUNTER — Other Ambulatory Visit: Payer: Self-pay | Admitting: Internal Medicine

## 2020-06-09 NOTE — Telephone Encounter (Signed)
Prescription refill request for Eliquis received. Indication:afib Last office visit:12/06/19 Scr:0.964 on 07/26/19 Age: 70 Weight: 76.2kg

## 2020-07-06 ENCOUNTER — Other Ambulatory Visit: Payer: Self-pay | Admitting: Internal Medicine

## 2020-07-07 ENCOUNTER — Ambulatory Visit: Payer: PPO | Admitting: Obstetrics and Gynecology

## 2020-07-12 ENCOUNTER — Ambulatory Visit (HOSPITAL_BASED_OUTPATIENT_CLINIC_OR_DEPARTMENT_OTHER): Payer: PPO | Admitting: Internal Medicine

## 2020-07-12 ENCOUNTER — Encounter (HOSPITAL_BASED_OUTPATIENT_CLINIC_OR_DEPARTMENT_OTHER): Payer: Self-pay | Admitting: Internal Medicine

## 2020-07-12 ENCOUNTER — Other Ambulatory Visit: Payer: Self-pay

## 2020-07-12 VITALS — BP 136/84 | HR 70 | Ht 64.0 in | Wt 174.0 lb

## 2020-07-12 DIAGNOSIS — I4892 Unspecified atrial flutter: Secondary | ICD-10-CM | POA: Diagnosis not present

## 2020-07-12 DIAGNOSIS — R000 Tachycardia, unspecified: Secondary | ICD-10-CM

## 2020-07-12 DIAGNOSIS — I4819 Other persistent atrial fibrillation: Secondary | ICD-10-CM | POA: Diagnosis not present

## 2020-07-12 DIAGNOSIS — I10 Essential (primary) hypertension: Secondary | ICD-10-CM

## 2020-07-12 DIAGNOSIS — I341 Nonrheumatic mitral (valve) prolapse: Secondary | ICD-10-CM

## 2020-07-12 DIAGNOSIS — I43 Cardiomyopathy in diseases classified elsewhere: Secondary | ICD-10-CM | POA: Diagnosis not present

## 2020-07-12 MED ORDER — NADOLOL 40 MG PO TABS: 40.0000 mg | ORAL_TABLET | Freq: Two times a day (BID) | ORAL | 3 refills | Status: DC

## 2020-07-12 NOTE — Patient Instructions (Addendum)
Medication Instructions:  Reduce Nadolol to 40 mg, two times a day  Your physician recommends that you continue on your current medications as directed. Please refer to the Current Medication list given to you today.  Labwork: None ordered.  Testing/Procedures: None ordered.  Follow-Up: Your physician wants you to follow-up in: 12 months with Thompson Grayer, MD     You will receive a reminder letter in the mail two months in advance. If you don't receive a letter, please call our office to schedule the follow-up appointment.   Any Other Special Instructions Will Be Listed Below (If Applicable).  If you need a refill on your cardiac medications before your next appointment, please call your pharmacy.

## 2020-07-12 NOTE — Progress Notes (Signed)
PCP: [name], DO Primary Cardiologist: Dr Acie Fredrickson Primary EP: Dr Rayann Heman  Anna Marquez is a 70 y.o. female who presents today for routine electrophysiology followup.  Since last being seen in our clinic, the patient reports doing very well.  She has rare palpitations but is mostly staying in sinus rhythm.  Today, she denies symptoms of chest pain, shortness of breath,  lower extremity edema, dizziness, presyncope, or syncope.  The patient is otherwise without complaint today.   Past Medical History:  Diagnosis Date   Adenomatous colon polyp 2003   Cholelithiasis    COVID    Diverticulosis of colon    Gastric polyps    GERD (gastroesophageal reflux disease)    Helicobacter pylori (H. pylori)    Hiatal hernia    Hypertension    Mitral valve prolapse    Nephrolithiasis    Osteoarthritis    Osteopenia 03/2017   T score -1.5 FRAX 8.9% / 0.9%.  Stable from prior DEXA 2016   Persistent atrial fibrillation ([facility])    TGA (transient global amnesia)    Past Surgical History:  Procedure Laterality Date   ANTERIOR AND POSTERIOR REPAIR N/A 06/14/2014   Procedure: ANTERIOR (CYSTOCELE) AND POSTERIOR REPAIR (RECTOCELE);  Surgeon: Anastasio Auerbach, MD;  Location: [facility];  Service: Gynecology;  Laterality: N/A;   ATRIAL FIBRILLATION ABLATION N/A 02/11/2019   Procedure: ATRIAL FIBRILLATION ABLATION;  Surgeon: Thompson Grayer, MD;  Location: [facility];  Service: Cardiovascular;  Laterality: N/A;   CARDIOVERSION N/A 01/15/2019   Procedure: CARDIOVERSION;  Surgeon: Fay Records, MD;  Location: [facility];  Service: Cardiovascular;  Laterality: N/A;   CHOLECYSTECTOMY     kidney stone removal     x2   VAGINAL HYSTERECTOMY N/A 06/14/2014   Procedure: HYSTERECTOMY VAGINAL;  Surgeon: Anastasio Auerbach, MD;  Location: [facility];  Service: Gynecology;  Laterality: N/A;    ROS- all systems are reviewed and negatives except as per HPI above  Current Outpatient Medications  Medication Sig  Dispense Refill   acetaminophen (TYLENOL) 500 MG tablet Take 1,000 mg by mouth every 6 (six) hours as needed for moderate pain or headache.     Ascorbic Acid (VITAMIN C) 1000 MG tablet Take 1,000 mg by mouth daily.     Cholecalciferol (VITAMIN D) 50 MCG (2000 UT) tablet Take 2,000 Units by mouth daily.     diclofenac Sodium (VOLTAREN) 1 % GEL Apply topically 4 (four) times daily.     diltiazem (CARDIZEM) 30 MG tablet TAKE 1 TABLET EVERY 4 HOURS AS NEEDED FOR HEART RATE GREATER THAN 100 45 tablet 1   ELIQUIS 5 MG TABS tablet TAKE 1 TABLET BY MOUTH 2 TIMES DAILY. 60 tablet 5   ferrous sulfate 325 (65 FE) MG tablet Take 325 mg by mouth every other day.     fluticasone (FLONASE) 50 MCG/ACT nasal spray PLACE 1 SPRAY INTO BOTH NOSTRILS AS NEEDED FOR ALLERGIES OR RHINITIS. 16 g 6   furosemide (LASIX) 20 MG tablet TAKE 1 TABLET (20 MG TOTAL) BY MOUTH DAILY AS NEEDED FOR EDEMA. 15 tablet 1   HYDROcodone-homatropine (HYCODAN) 5-1.5 MG/5ML syrup Take 5 mLs by mouth every 8 (eight) hours as needed for cough. 120 mL 0   lisinopril (ZESTRIL) 20 MG tablet Take 1 tablet (20 mg total) by mouth daily. Please keep upcoming appt in July 2022 with Dr. Rayann Heman before anymore refills. Thank you 90 tablet 0   loratadine (CLARITIN) 10 MG tablet Take 10 mg  by mouth daily.      nadolol (CORGARD) 40 MG tablet Take 1.5 tablets (60 mg total) by mouth every morning AND 1 tablet (40 mg total) every evening. 225 tablet 3   pantoprazole (PROTONIX) 40 MG tablet TAKE 1 TABLET (40 MG TOTAL) BY MOUTH DAILY. 90 tablet 4   potassium chloride (KLOR-CON) 10 MEQ tablet TAKE 1 TABLET BY MOUTH DAILY AS NEEDED (TAKE WITH HCTZ). 30 tablet 6   PROAIR HFA 108 (90 Base) MCG/ACT inhaler INHALE 1 TO 2 PUFFS INTO THE LUNGS EVERY 6 HOURS AS NEEDED FOR WHEEZING OR SHORTNESS OF BREATH. 8.5 g 3   Probiotic Product (PROBIOTIC PO) Take 1 tablet by mouth daily.     vitamin E 180 MG (400 UNITS) capsule Take 400 Units by mouth daily.     ZINC CITRATE PO Take  by mouth.     No current facility-administered medications for this visit.    Physical Exam: Vitals:   07/12/20 1344  BP: 136/84  Pulse: 70  SpO2: 96%  Weight: 174 lb (78.9 kg)  Height: 5\' 4"  (1.626 m)    GEN- The patient is well appearing, alert and oriented x 3 today.   Head- normocephalic, atraumatic Eyes-  Sclera clear, conjunctiva pink Ears- hearing intact Oropharynx- clear Lungs- Clear to ausculation bilaterally, normal work of breathing Heart- Regular rate and rhythm, no murmurs, rubs or gallops, PMI not laterally displaced GI- soft, NT, ND, + BS Extremities- no clubbing, cyanosis, or edema  Wt Readings from Last 3 Encounters:  07/12/20 174 lb (78.9 kg)  04/11/20 168 lb (76.2 kg)  02/07/20 173 lb 6.4 oz (78.7 kg)    EKG tracing ordered today is personally reviewed and shows sinus rhythm 70 bpm, PR 204 msec  Assessment and Plan:  Persistent atrial fibrillation/ atrial flutter Doing well post ablation off tikosyn Chads2vasc score is 3. She is on elquis. Reduce nadolol to 40mg  BID  2. HTN Continue lisinopril 20mg  daily Reduce nadolol as above  3. Nonischemic CM EF has normalized post ablation  4. MV prolapse Echo 08/27/19 reveals mild to moderate MR with mild prolapse Consider repeat echo on follow-up with Dr Acie Fredrickson  Risks, benefits and potential toxicities for medications prescribed and/or refilled reviewed with patient today.   Return to see me in a year  Thompson Grayer MD, [facility] 07/12/2020 2:07 PM

## 2020-07-14 ENCOUNTER — Telehealth: Payer: Self-pay | Admitting: Cardiovascular Disease

## 2020-07-14 NOTE — Telephone Encounter (Signed)
Anna Marquez is calling stating when she recently saw Dr. Rayann Heman he mentioned he believe's Dr. Acie Fredrickson is wanting her to have an Echo, but there is no order in the system to schedule. Please advise.

## 2020-07-14 NOTE — Telephone Encounter (Signed)
Dr. Acie Fredrickson, please advise...Marland KitchenMarland Kitchen.  do you want echo prior to yearly f/u 10/3?  [facility]'s note: 4. MV prolapse Echo 08/27/19 reveals mild to moderate MR with mild prolapse Consider repeat echo on follow-up with Dr Acie Fredrickson

## 2020-07-17 ENCOUNTER — Telehealth: Payer: Self-pay | Admitting: Pharmacist

## 2020-07-17 NOTE — Telephone Encounter (Signed)
Left a detailed message ok per DPR.  Informed pt of Dr. Acie Fredrickson recommendation that she follow up with him in Oct and he will determine f/u echo at that time.  I informed her to call the office with any questions or concerns.

## 2020-07-17 NOTE — Chronic Care Management (AMB) (Signed)
Chronic Care Management Pharmacy Assistant   Name: Anna Marquez  MRN: [id number] DOB: [birth year]  Anna Marquez is an 70 y.o. year old female who presents for her initial CCM visit with the clinical pharmacist.  Reason for Encounter: Initial CCM Visit   Recent office visits:  [name] DO(PCP)- Patient seen for Sinobronchitis. Started on doxycycline and add course of prednisone 40mg  BID x5 days. Started hycodan as needed for cough. Follow up as needed.  02/07/20 [name] DO(PCP)- Patient seen for Paroxysmal Atrial fibrillation.Follow up in 6 months for Hypertension.  Recent consult visits:  07/12/20 Tiana Loft MD(Cardiology)- Patient seen for Atrial Fibrillation. Reduced Nadolol to 40 MG BID. Consider repeat echo on follow-up with Dr Acie Fredrickson. Follow up in 1 year.   Hospital visits:  None in previous 6 months  Medications: Outpatient Encounter Medications as of 07/17/2020  Medication Sig   acetaminophen (TYLENOL) 500 MG tablet Take 1,000 mg by mouth every 6 (six) hours as needed for moderate pain or headache.   Ascorbic Acid (VITAMIN C) 1000 MG tablet Take 1,000 mg by mouth daily.   Cholecalciferol (VITAMIN D) 50 MCG (2000 UT) tablet Take 2,000 Units by mouth daily.   diclofenac Sodium (VOLTAREN) 1 % GEL Apply topically 4 (four) times daily.   diltiazem (CARDIZEM) 30 MG tablet TAKE 1 TABLET EVERY 4 HOURS AS NEEDED FOR HEART RATE GREATER THAN 100   ELIQUIS 5 MG TABS tablet TAKE 1 TABLET BY MOUTH 2 TIMES DAILY.   ferrous sulfate 325 (65 FE) MG tablet Take 325 mg by mouth every other day.   fluticasone (FLONASE) 50 MCG/ACT nasal spray PLACE 1 SPRAY INTO BOTH NOSTRILS AS NEEDED FOR ALLERGIES OR RHINITIS.   furosemide (LASIX) 20 MG tablet TAKE 1 TABLET (20 MG TOTAL) BY MOUTH DAILY AS NEEDED FOR EDEMA.   HYDROcodone-homatropine (HYCODAN) 5-1.5 MG/5ML syrup Take 5 mLs by mouth every 8 (eight) hours as needed for cough.   lisinopril (ZESTRIL) 20 MG tablet Take 1 tablet (20 mg  total) by mouth daily. Please keep upcoming appt in July 2022 with Dr. Rayann Heman before anymore refills. Thank you   loratadine (CLARITIN) 10 MG tablet Take 10 mg by mouth daily.    nadolol (CORGARD) 40 MG tablet Take 1 tablet (40 mg total) by mouth 2 (two) times daily.   pantoprazole (PROTONIX) 40 MG tablet TAKE 1 TABLET (40 MG TOTAL) BY MOUTH DAILY.   potassium chloride (KLOR-CON) 10 MEQ tablet TAKE 1 TABLET BY MOUTH DAILY AS NEEDED (TAKE WITH HCTZ).   PROAIR HFA 108 (90 Base) MCG/ACT inhaler INHALE 1 TO 2 PUFFS INTO THE LUNGS EVERY 6 HOURS AS NEEDED FOR WHEEZING OR SHORTNESS OF BREATH.   Probiotic Product (PROBIOTIC PO) Take 1 tablet by mouth daily.   vitamin E 180 MG (400 UNITS) capsule Take 400 Units by mouth daily.   ZINC CITRATE PO Take by mouth.   No facility-administered encounter medications on file as of 07/17/2020.    Medications acetaminophen (TYLENOL) 500 MG tablet  Ascorbic Acid (VITAMIN C) 1000 MG tablet  Cholecalciferol (VITAMIN D) 50 MCG (2000 UT) tablet diclofenac Sodium (VOLTAREN) 1 % GEL diltiazem (CARDIZEM) 30 MG tablet ELIQUIS 5 MG TABS tablet last filled 07/06/20 30 DS ferrous sulfate 325 (65 FE) MG tablet fluticasone (FLONASE) 50 MCG/ACT nasal spray last filled 03/07/20 30 DS furosemide (LASIX) 20 MG tablet last filled 05/09/20 15 DS HYDROcodone-homatropine (HYCODAN) 5-1.5 MG/5ML syrup lisinopril (ZESTRIL) 20 MG tablet last filled 07/06/20 90 DS loratadine (CLARITIN)  10 MG tablet nadolol (CORGARD) 40 MG tablet last filled 07/06/20 90 DS pantoprazole (PROTONIX) 40 MG tablet last filled 05/09/20 90 DS potassium chloride (KLOR-CON) 10 MEQ tablet last filled 10/12/19 30 DS PROAIR HFA 108 (90 Base) MCG/ACT inhaler Probiotic Product (PROBIOTIC PO) vitamin E 180 MG (400 UNITS) capsule   Star Rating Drugs:  lisinopril (ZESTRIL) 20 MG tablet last filled 07/06/20 90 DS  [place] Pharmacist Assistant [phone]

## 2020-07-20 ENCOUNTER — Other Ambulatory Visit: Payer: Self-pay

## 2020-07-20 ENCOUNTER — Ambulatory Visit (INDEPENDENT_AMBULATORY_CARE_PROVIDER_SITE_OTHER): Payer: PPO | Admitting: Pharmacist

## 2020-07-20 DIAGNOSIS — I480 Paroxysmal atrial fibrillation: Secondary | ICD-10-CM | POA: Diagnosis not present

## 2020-07-20 DIAGNOSIS — I10 Essential (primary) hypertension: Secondary | ICD-10-CM | POA: Diagnosis not present

## 2020-07-20 NOTE — Progress Notes (Signed)
Chronic Care Management Pharmacy Note  07/20/2020 Name:  Anna Marquez MRN:  [id number] DOB:  [birth year]  Summary: addressed HTN, Afib  Recommendations/Changes made from today's visit: none  Plan: f/u with pharmacist in 6 months   Subjective: Anna Marquez is an 70 y.o. year old female who is a primary patient of Anna Nutting, DO.  The CCM team was consulted for assistance with disease management and care coordination needs.    Engaged with patient by telephone for initial visit in response to provider referral for pharmacy case management and/or care coordination services.   Consent to Services:  The patient was given information about Chronic Care Management services, agreed to services, and gave verbal consent prior to initiation of services.  Please see initial visit note for detailed documentation.   Patient Care Team: Anna Nutting, DO as PCP - General (Family Medicine) Nahser, Wonda Cheng, MD as PCP - Cardiology (Cardiology) Nahser, Wonda Cheng, MD (Cardiology) Darius Bump, [facility] as Pharmacist (Pharmacist)  Recent office visits:  Anna Nutting DO(PCP)- Patient seen for Sinobronchitis. Started on doxycycline and add course of prednisone 32m BID x5 days. Started hycodan as needed for cough. Follow up as needed.   02/07/20 CLuetta NuttingDO(PCP)- Patient seen for Paroxysmal Atrial fibrillation.Follow up in 6 months for Hypertension.   Recent consult visits:  07/12/20 JTiana LoftMD(Cardiology)- Patient seen for Atrial Fibrillation. Reduced Nadolol to 40 MG BID. Consider repeat echo on follow-up with Dr NAcie Fredrickson Follow up in 1 year.  Objective:  Lab Results  Component Value Date   CREATININE 0.96 07/26/2019   CREATININE 1.01 (H) 02/04/2019   CREATININE 1.14 (H) 01/04/2019    Lab Results  Component Value Date   HGBA1C 5.1 08/21/2015   Last diabetic Eye exam: No results found for: HMDIABEYEEXA  Last diabetic Foot exam: No results found for: HMDIABFOOTEX       Component Value Date/Time   CHOL 184 11/04/2018 1128   TRIG 259.0 (H) 11/04/2018 1128   HDL 50.60 11/04/2018 1128   CHOLHDL 4 11/04/2018 1128   VLDL 51.8 (H) 11/04/2018 1128   LDLCALC 103 (H) 10/28/2017 1657   LDLDIRECT 100.0 11/04/2018 1128    Hepatic Function Latest Ref Rng & Units 07/26/2019 11/11/2018 11/05/2018  Total Protein 6.5 - 8.1 g/dL 7.8 6.7 6.3(L)  Albumin 3.5 - 5.0 g/dL 4.5 4.2 3.7  AST 15 - 41 U/L 19 11 23   ALT 0 - 44 U/L 15 13 18   Alk Phosphatase 38 - 126 U/L 52 43 41  Total Bilirubin 0.3 - 1.2 mg/dL 2.1(H) 1.6(H) 2.4(H)  Bilirubin, Direct 0.0 - 0.2 mg/dL 0.2 0.2 -    Lab Results  Component Value Date/Time   TSH 0.20 (L) 11/04/2018 11:28 AM   TSH 0.409 03/17/2018 03:47 AM   TSH 0.30 (L) 10/28/2017 04:57 PM   FREET4 1.17 11/11/2018 01:38 PM   FREET4 1.16 03/18/2018 04:15 AM    CBC Latest Ref Rng & Units 07/26/2019 02/04/2019 01/04/2019  WBC 4.0 - 10.5 K/uL 8.4 9.3 6.4  Hemoglobin 12.0 - 15.0 g/dL 13.0 13.4 14.2  Hematocrit 36.0 - 46.0 % 37.2 39.0 42.2  Platelets 150 - 400 K/uL 206 214 211     Social History   Tobacco Use  Smoking Status Never  Smokeless Tobacco Never   BP Readings from Last 3 Encounters:  07/12/20 136/84  04/11/20 135/87  02/07/20 (!) 160/83   Pulse Readings from Last 3 Encounters:  07/12/20 70  04/11/20 76  02/07/20 85  Wt Readings from Last 3 Encounters:  07/12/20 174 lb (78.9 kg)  04/11/20 168 lb (76.2 kg)  02/07/20 173 lb 6.4 oz (78.7 kg)    Assessment: Review of patient past medical history, allergies, medications, health status, including review of consultants reports, laboratory and other test data, was performed as part of comprehensive evaluation and provision of chronic care management services.   SDOH:  (Social Determinants of Health) assessments and interventions performed:    CCM Care Plan  Allergies  Allergen Reactions   Erythromycin     Upset stomach   Nsaids Other (See Comments)    Has a history of  bleeding ulcers, tolerates aspirin    Penicillins Hives and Shortness Of Breath    Did it involve swelling of the face/tongue/throat, SOB, or low BP? Yes Did it involve sudden or severe rash/hives, skin peeling, or any reaction on the inside of your mouth or nose? No Did you need to seek medical attention at a hospital or doctor's office? No When did it last happen?      45 years If all above answers are "NO", may proceed with cephalosporin use.     Medications Reviewed Today     Reviewed by Darius Bump, [facility] (Pharmacist) on 07/20/20 at 1124  Med List Status: <None>   Medication Order Taking? Sig Documenting Provider Last Dose Status Informant  acetaminophen (TYLENOL) 500 MG tablet [id number] Yes Take 1,000 mg by mouth every 6 (six) hours as needed for moderate pain or headache. [provider] Taking Active Self  Ascorbic Acid (VITAMIN C) 1000 MG tablet [id number] Yes Take 1,000 mg by mouth daily. [provider] Taking Active Self  Cholecalciferol (VITAMIN D) 50 MCG (2000 UT) tablet [id number] Yes Take 2,000 Units by mouth daily. [provider] Taking Active Self  diclofenac Sodium (VOLTAREN) 1 % GEL [id number] Yes Apply topically 4 (four) times daily. [provider] Taking Active Self  diltiazem (CARDIZEM) 30 MG tablet [id number] Yes TAKE 1 TABLET EVERY 4 HOURS AS NEEDED FOR HEART RATE GREATER THAN 100 Sherran Needs, NP Taking Active   ELIQUIS 5 MG TABS tablet [id number] Yes TAKE 1 TABLET BY MOUTH 2 TIMES DAILY. Thompson Grayer, MD Taking Active   ferrous sulfate 325 (65 FE) MG tablet [id number] Yes Take 325 mg by mouth every other day. [provider] Taking Active Self  fluticasone (FLONASE) 50 MCG/ACT nasal spray [id number] Yes PLACE 1 SPRAY INTO BOTH NOSTRILS AS NEEDED FOR ALLERGIES OR RHINITIS. Anna Nutting, DO Taking Active   furosemide (LASIX) 20 MG tablet [id number] Yes TAKE 1 TABLET (20 MG TOTAL) BY MOUTH DAILY AS NEEDED FOR EDEMA.  Sherran Needs, NP Taking Active            Med Note Tonny Bollman Jul 20, 2020 11:21 AM) Only takes very occasionally  HYDROcodone-homatropine (HYCODAN) 5-1.5 MG/5ML syrup [id number] Yes Take 5 mLs by mouth every 8 (eight) hours as needed for cough. Anna Nutting, DO Taking Active   lisinopril (ZESTRIL) 20 MG tablet [id number] Yes Take 1 tablet (20 mg total) by mouth daily. Please keep upcoming appt in July 2022 with Dr. Rayann Heman before anymore refills. Thank you Allred, Jeneen Rinks, MD Taking Active   loratadine (CLARITIN) 10 MG tablet [id number] Yes Take 10 mg by mouth daily.  [provider] Taking Active Self  nadolol (CORGARD) 40 MG tablet [id number] Yes Take 1 tablet (40 mg total) by mouth 2 (two) times daily. Thompson Grayer, MD Taking Active  pantoprazole (PROTONIX) 40 MG tablet [id number] Yes TAKE 1 TABLET (40 MG TOTAL) BY MOUTH DAILY. Anna Nutting, DO Taking Active   potassium chloride (KLOR-CON) 10 MEQ tablet [id number] Yes TAKE 1 TABLET BY MOUTH DAILY AS NEEDED (TAKE WITH HCTZ). Nahser, Wonda Cheng, MD Taking Active   PROAIR HFA 108 [phone]) MCG/ACT inhaler [id number] Yes INHALE 1 TO 2 PUFFS INTO THE LUNGS EVERY 6 HOURS AS NEEDED FOR WHEEZING OR SHORTNESS OF BREATH. Anna Nutting, DO Taking Active   Probiotic Product (PROBIOTIC PO) [id number] Yes Take 1 tablet by mouth daily. [provider] Taking Active Self  vitamin E 180 MG (400 UNITS) capsule [id number] Yes Take 400 Units by mouth daily. [provider] Taking Active Self  ZINC CITRATE PO [id number] Yes Take 22 mg by mouth in the morning and at bedtime. [provider] Taking Active             Patient Active Problem List   Diagnosis Date Noted   Sinobronchitis 02/01/2019   Seasonal allergic rhinitis due to pollen 09/25/2018   Reactive airway disease 09/25/2018   Hypomagnesemia 03/25/2018   Atrial fibrillation with RVR ([place]) 03/16/2018   Anemia 02/04/2018   Recurrent nephrolithiasis  02/04/2018   Adhesive capsulitis 02/04/2018   Screening for lipid disorders 10/28/2017   Palpitations 01/31/2017   Cough 11/21/2016   Hypokalemia 03/07/2016   TIA (transient ischemic attack) 03/07/2016   Hematuria 12/06/2015   Essential hypertension 01/27/2012   A-fib ([place]) 01/09/2012   Migraine without aura 05/20/2008   Allergic rhinitis 05/20/2008   Mitral regurgitation 08/10/2007   GERD 08/10/2007   Diaphragmatic hernia 08/10/2007   DIVERTICULOSIS, COLON 08/10/2007   Osteoarthritis 08/10/2007   COLONIC POLYPS, ADENOMATOUS, HX OF 08/10/2007    Immunization History  Administered Date(s) Administered   Fluad Quad(high Dose 65+) 11/04/2018, 11/10/2019   Influenza Whole 10/07/2009   Influenza, High Dose Seasonal PF 09/12/2015, 10/25/2016, 10/28/2017   Influenza, Seasonal, Injecte, Preservative Fre 01/09/2012   Influenza-Unspecified 10/24/2016   PFIZER(Purple Top)SARS-COV-2 Vaccination 03/04/2019, 03/24/2019   Pneumococcal Conjugate-13 05/02/2014   Pneumococcal Polysaccharide-23 09/12/2015   Td 01/08/2007, 10/28/2017   Zoster, Live 09/12/2015    Conditions to be addressed/monitored: Atrial Fibrillation and HTN  Care Plan : Medication Management  Updates made by Darius Bump, [place] since 07/20/2020 12:00 AM     Problem: HTN, Afib      Long-Range Goal: Disease Progression Prevention   Start Date: 07/20/2020  This Visit's Progress: On track  Priority: High  Note:   Current Barriers:  None at present  Pharmacist Clinical Goal(s):  Over the next 180 days, patient will maintain control of chronic conditions as evidenced by medication fill history, lab values, vital signs  through collaboration with PharmD and provider.   Interventions: 1:1 collaboration with Anna Nutting, DO regarding development and update of comprehensive plan of care as evidenced by provider attestation and co-signature Inter-disciplinary care team collaboration (see longitudinal plan of  care) Comprehensive medication review performed; medication list updated in electronic medical record  Hypertension:  Controlled; current treatment:lisinopril 82m daily, nadalol 448mBID, furosemide PRN, diltiazem PRN ;   Current home readings: 110s-130s/70-90s   Denies hypotensive/hypertensive symptoms  Recommended continue current regimen  and  Atrial Fibrillation:  Controlled; current rate/rhythm control: nadalol 4064mID, diltiazem 68m37mN HR >100; anticoagulant treatment: eliquis 5mg 50m  Recommended continue current regimen  Patient Goals/Self-Care Activities Over the next 180 days, patient will:  take medications as prescribed  Follow Up Plan: Telephone follow up  appointment with care management team member scheduled for:  6 months      Medication Assistance: None required.  Patient affirms current coverage meets needs.  Patient's preferred pharmacy is:  [facility], [address] [place] [address] Phone: [phone] Fax: [phone]  Uses pill box? No - uses storage bin and working well for her Pt endorses 100% compliance  Follow Up:  Patient agrees to Care Plan and Follow-up.  Plan: Telephone follow up appointment with care management team member scheduled for:  6 months  Darius Bump

## 2020-07-20 NOTE — Patient Instructions (Signed)
Visit Information   PATIENT GOALS:   Goals Addressed             This Visit's Progress    Medication Management       Patient Goals/Self-Care Activities Over the next 180 days, patient will:  take medications as prescribed  Follow Up Plan: Telephone follow up appointment with care management team member scheduled for:  6 months         Consent to CCM Services: Anna Marquez was given information about Chronic Care Management services today including:  CCM service includes personalized support from designated clinical staff supervised by her physician, including individualized plan of care and coordination with other care providers 24/7 contact phone numbers for assistance for urgent and routine care needs. Service will only be billed when office clinical staff spend 20 minutes or more in a month to coordinate care. Only one practitioner may furnish and bill the service in a calendar month. The patient may stop CCM services at any time (effective at the end of the month) by phone call to the office staff. The patient will be responsible for cost sharing (co-pay) of up to 20% of the service fee (after annual deductible is met).  Patient agreed to services and verbal consent obtained.   Patient verbalizes understanding of instructions provided today and agrees to view in [place].   Telephone follow up appointment with care management team member scheduled for: 6 months  [place]: Patient Care Plan: Medication Management     Problem Identified: HTN, Afib      Long-Range Goal: Disease Progression Prevention   Start Date: 07/20/2020  This Visit's Progress: On track  Priority: High  Note:   Current Barriers:  None at present  Pharmacist Clinical Goal(s):  Over the next 180 days, patient will maintain control of chronic conditions as evidenced by medication fill history, lab values, vital signs  through collaboration with PharmD and provider.    Interventions: 1:1 collaboration with [name], DO regarding development and update of comprehensive plan of care as evidenced by provider attestation and co-signature Inter-disciplinary care team collaboration (see longitudinal plan of care) Comprehensive medication review performed; medication list updated in electronic medical record  Hypertension:  Controlled; current treatment:lisinopril 85m daily, nadalol 438mBID, furosemide PRN, diltiazem PRN ;   Current home readings: 110s-130s/70-90s   Denies hypotensive/hypertensive symptoms  Recommended continue current regimen  and  Atrial Fibrillation:  Controlled; current rate/rhythm control: nadalol 4067mID, diltiazem 61m54mN HR >100; anticoagulant treatment: eliquis 5mg 17m  Recommended continue current regimen  Patient Goals/Self-Care Activities Over the next 180 days, patient will:  take medications as prescribed  Follow Up Plan: Telephone follow up appointment with care management team member scheduled for:  6 months

## 2020-07-25 ENCOUNTER — Encounter: Payer: Self-pay | Admitting: Physician Assistant

## 2020-07-25 ENCOUNTER — Telehealth (INDEPENDENT_AMBULATORY_CARE_PROVIDER_SITE_OTHER): Payer: PPO | Admitting: Physician Assistant

## 2020-07-25 VITALS — BP 134/87 | HR 71 | Temp 99.9°F | Ht 64.0 in | Wt 170.0 lb

## 2020-07-25 DIAGNOSIS — R059 Cough, unspecified: Secondary | ICD-10-CM

## 2020-07-25 DIAGNOSIS — J019 Acute sinusitis, unspecified: Secondary | ICD-10-CM | POA: Diagnosis not present

## 2020-07-25 DIAGNOSIS — J4521 Mild intermittent asthma with (acute) exacerbation: Secondary | ICD-10-CM

## 2020-07-25 MED ORDER — HYDROCODONE BIT-HOMATROP MBR 5-1.5 MG/5ML PO SOLN: 5.0000 mL | Freq: Four times a day (QID) | ORAL | 0 refills | Status: DC | PRN

## 2020-07-25 MED ORDER — DOXYCYCLINE HYCLATE 100 MG PO TABS: 100.0000 mg | ORAL_TABLET | Freq: Two times a day (BID) | ORAL | 0 refills | Status: AC

## 2020-07-25 MED ORDER — PREDNISONE 20 MG PO TABS: 20.0000 mg | ORAL_TABLET | Freq: Two times a day (BID) | ORAL | 0 refills | Status: AC

## 2020-07-25 NOTE — Progress Notes (Signed)
Telemedicine Visit via   Audio only - telephone (patient preference /  technical difficulty with MyChart video application)  I connected with [name] on 07/25/20 at 10:05 AM  by phone or  telemedicine application as noted above  I verified that I am speaking with or regarding  the correct patient using two identifiers.  Participants: Myself, Nelson Chimes PA-C Patient: Anna Marquez   Patient is at home in [facility] I am in office at [facility]    I discussed the limitations of evaluation and management  by telemedicine and the availability of in person appointments.  The participant(s) above expressed understanding and  agreed to proceed with this appointment via telemedicine.       History of Present Illness: KANNA DAFOE is a 70 y.o. female with PMH of mild intermittent asthma, allergic rhinitis, Afib, who would like to discuss cough/congestion  She endorses approx 1 week of cough, congestion, sinus pressure, headaches behind the eyes, ear fullness and low grade fevers below 100F Symptoms are gradually worsening Using rescue inhaler as needed for wheezing 2-3 times per day, which is more than baseline Cough is dry and occasionally there is yellowish sputum Denies sick contacts COVID antigen test negative today She reports she had a similar illness in April - was prescribed Hycodan cough syrup that was helpful and would like prescription for this   Observations/Objective: BP 134/87   Pulse 71   Temp 99.9 F (37.7 C) (Oral)   Ht 5\' 4"  (1.626 m)   Wt 170 lb (77.1 kg)   SpO2 96%   BMI 29.18 kg/m  BP Readings from Last 3 Encounters:  07/25/20 134/87  07/12/20 136/84  04/11/20 135/87   Patient provided VS reviewed Exam limited by telephone Patient alert, voice is hoarse, coughing frequently, speaking in full sentences, thought content normal  Lab and Radiology Results No results found for this or any previous visit (from the past 72  hour(s)). No results found.     Assessment and Plan: 70 y.o. female with The primary encounter diagnosis was Cough. Diagnoses of Mild intermittent asthma with acute exacerbation and Acute non-recurrent sinusitis, unspecified location were also pertinent to this visit.  Treating empirically for bacterial sinusitis with Doxycycline 100 mg bid x 7 days Treating for mild asthma exacerbation with Prednisone 20 mg bid wc x 5 days Hycodan 5-1.5mg  Q6H prn cough - counseled on controlled substance risk of respiratory depression and unintended overdose, do not combine with other sedatives, store in a safe place - PDMP reviewed Continue Albuterol 2 puffs tid Counseled on return precautions Follow-up prn persistent or worsening symptoms  Counseled patient to follow-up in person with PCP regarding history of migraine headaches. We discussed that migraine can mimic TIA and CVA and that persons with hx of migraine w/aura are at increased risk for these. We discussed medication overuse headache and to stop OTC analgesics once current illness has resolved. We discussed that COVID booster may help with long-COVID symptoms. We discussed going to the ER for persistent vision change, focal weakness, speech difficulty/change or severe headache.  PDMP reviewed during this encounter. No orders of the defined types were placed in this encounter.  Meds ordered this encounter  Medications   doxycycline (VIBRA-TABS) 100 MG tablet    Sig: Take 1 tablet (100 mg total) by mouth 2 (two) times daily for 7 days.    Dispense:  14 tablet    Refill:  0    Order Specific Question:  Supervising Provider    Answer:   Emeterio Reeve [[id number]]   predniSONE (DELTASONE) 20 MG tablet    Sig: Take 1 tablet (20 mg total) by mouth 2 (two) times daily with a meal for 5 days.    Dispense:  10 tablet    Refill:  0    Order Specific Question:   Supervising Provider    Answer:   Emeterio Reeve [[id number]]   HYDROcodone  bit-homatropine (HYCODAN) 5-1.5 MG/5ML syrup    Sig: Take 5 mLs by mouth every 6 (six) hours as needed for cough.    Dispense:  120 mL    Refill:  0    Order Specific Question:   Supervising Provider    Answer:   Emeterio Reeve [[id number]]   There are no Patient Instructions on file for this visit.  Instructions sent via MyChart.   Follow Up Instructions: No follow-ups on file.    I discussed the assessment and treatment plan with the patient. The patient was provided an opportunity to ask questions and all were answered. The patient agreed with the plan and demonstrated an understanding of the instructions.   The patient was advised to call back or seek an in-person evaluation if any new concerns, if symptoms worsen or if the condition fails to improve as anticipated.  14 minutes of non-face-to-face time was provided during this encounter.      . . . . . . . . . . . . . [name]                   Historical information moved to improve visibility of documentation.  Past Medical History:  Diagnosis Date   Adenomatous colon polyp 2003   Cholelithiasis    COVID    Diverticulosis of colon    Gastric polyps    GERD (gastroesophageal reflux disease)    Helicobacter pylori (H. pylori)    Hiatal hernia    Hypertension    Mitral valve prolapse    Nephrolithiasis    Osteoarthritis    Osteopenia 03/2017   T score -1.5 FRAX 8.9% / 0.9%.  Stable from prior DEXA 2016   Persistent atrial fibrillation (HCC)    TGA (transient global amnesia)    Past Surgical History:  Procedure Laterality Date   ANTERIOR AND POSTERIOR REPAIR N/A 06/14/2014   Procedure: ANTERIOR (CYSTOCELE) AND POSTERIOR REPAIR (RECTOCELE);  Surgeon: Anastasio Auerbach, MD;  Location: [facility];  Service: Gynecology;  Laterality: N/A;   ATRIAL FIBRILLATION ABLATION N/A 02/11/2019   Procedure: ATRIAL FIBRILLATION ABLATION;  Surgeon: Thompson Grayer, MD;  Location: [facility];  Service:  Cardiovascular;  Laterality: N/A;   CARDIOVERSION N/A 01/15/2019   Procedure: CARDIOVERSION;  Surgeon: Fay Records, MD;  Location: [facility];  Service: Cardiovascular;  Laterality: N/A;   CHOLECYSTECTOMY     kidney stone removal     x2   VAGINAL HYSTERECTOMY N/A 06/14/2014   Procedure: HYSTERECTOMY VAGINAL;  Surgeon: Anastasio Auerbach, MD;  Location: [facility];  Service: Gynecology;  Laterality: N/A;   Social History   Tobacco Use   Smoking status: Never   Smokeless tobacco: Never  Substance Use Topics   Alcohol use: Never    Alcohol/week: 0.0 standard drinks   family history includes Breast cancer (age of onset: 1) in her paternal aunt; Cancer in her father; Congestive Heart Failure in her mother; Diabetes in her maternal grandmother; Heart disease in her maternal grandfather and maternal grandmother; Melanoma in her paternal  aunt; Neuropathy in her mother; Thyroid disease in her daughter.  Medications: Current Outpatient Medications  Medication Sig Dispense Refill   acetaminophen (TYLENOL) 500 MG tablet Take 1,000 mg by mouth every 6 (six) hours as needed for moderate pain or headache.     Ascorbic Acid (VITAMIN C) 1000 MG tablet Take 1,000 mg by mouth daily.     Cholecalciferol (VITAMIN D) 50 MCG (2000 UT) tablet Take 2,000 Units by mouth daily.     diclofenac Sodium (VOLTAREN) 1 % GEL Apply topically 4 (four) times daily.     diltiazem (CARDIZEM) 30 MG tablet TAKE 1 TABLET EVERY 4 HOURS AS NEEDED FOR HEART RATE GREATER THAN 100 45 tablet 1   doxycycline (VIBRA-TABS) 100 MG tablet Take 1 tablet (100 mg total) by mouth 2 (two) times daily for 7 days. 14 tablet 0   ELIQUIS 5 MG TABS tablet TAKE 1 TABLET BY MOUTH 2 TIMES DAILY. 60 tablet 5   ferrous sulfate 325 (65 FE) MG tablet Take 325 mg by mouth every other day.     fluticasone (FLONASE) 50 MCG/ACT nasal spray PLACE 1 SPRAY INTO BOTH NOSTRILS AS NEEDED FOR ALLERGIES OR RHINITIS. 16 g 6   furosemide (LASIX) 20 MG tablet TAKE 1  TABLET (20 MG TOTAL) BY MOUTH DAILY AS NEEDED FOR EDEMA. 15 tablet 1   HYDROcodone bit-homatropine (HYCODAN) 5-1.5 MG/5ML syrup Take 5 mLs by mouth every 6 (six) hours as needed for cough. 120 mL 0   lisinopril (ZESTRIL) 20 MG tablet Take 1 tablet (20 mg total) by mouth daily. Please keep upcoming appt in July 2022 with Dr. Rayann Heman before anymore refills. Thank you 90 tablet 0   loratadine (CLARITIN) 10 MG tablet Take 10 mg by mouth daily.      nadolol (CORGARD) 40 MG tablet Take 1 tablet (40 mg total) by mouth 2 (two) times daily. 180 tablet 3   pantoprazole (PROTONIX) 40 MG tablet TAKE 1 TABLET (40 MG TOTAL) BY MOUTH DAILY. 90 tablet 4   potassium chloride (KLOR-CON) 10 MEQ tablet TAKE 1 TABLET BY MOUTH DAILY AS NEEDED (TAKE WITH HCTZ). 30 tablet 6   predniSONE (DELTASONE) 20 MG tablet Take 1 tablet (20 mg total) by mouth 2 (two) times daily with a meal for 5 days. 10 tablet 0   PROAIR HFA 108 (90 Base) MCG/ACT inhaler INHALE 1 TO 2 PUFFS INTO THE LUNGS EVERY 6 HOURS AS NEEDED FOR WHEEZING OR SHORTNESS OF BREATH. 8.5 g 3   Probiotic Product (PROBIOTIC PO) Take 1 tablet by mouth daily.     vitamin E 180 MG (400 UNITS) capsule Take 400 Units by mouth daily.     ZINC CITRATE PO Take 22 mg by mouth in the morning and at bedtime.     No current facility-administered medications for this visit.   Allergies  Allergen Reactions   Erythromycin     Upset stomach   Nsaids Other (See Comments)    Has a history of bleeding ulcers, tolerates aspirin    Penicillins Hives and Shortness Of Breath    Did it involve swelling of the face/tongue/throat, SOB, or low BP? Yes Did it involve sudden or severe rash/hives, skin peeling, or any reaction on the inside of your mouth or nose? No Did you need to seek medical attention at a hospital or doctor's office? No When did it last happen?      45 years If all above answers are "NO", may proceed with cephalosporin use.  If phone visit, billing and coding  can please add appropriate modifier if needed

## 2020-07-25 NOTE — Progress Notes (Signed)
Started last week Cough (productive - clear/light yellow) Wheezing  Congestion Ears stopped up Headache Drainage Pressure/pain behind eyes  Using inhaler, flonase, claritin  Has a home Covid test, will take it now and let Evlyn Clines know results, has not tested yet

## 2020-07-26 ENCOUNTER — Ambulatory Visit (INDEPENDENT_AMBULATORY_CARE_PROVIDER_SITE_OTHER): Payer: PPO

## 2020-07-26 ENCOUNTER — Other Ambulatory Visit: Payer: Self-pay

## 2020-07-26 DIAGNOSIS — Z1231 Encounter for screening mammogram for malignant neoplasm of breast: Secondary | ICD-10-CM

## 2020-07-26 DIAGNOSIS — Z0000 Encounter for general adult medical examination without abnormal findings: Secondary | ICD-10-CM

## 2020-08-07 ENCOUNTER — Other Ambulatory Visit: Payer: Self-pay

## 2020-08-07 ENCOUNTER — Ambulatory Visit (INDEPENDENT_AMBULATORY_CARE_PROVIDER_SITE_OTHER): Payer: PPO | Admitting: Family Medicine

## 2020-08-07 ENCOUNTER — Encounter: Payer: Self-pay | Admitting: Family Medicine

## 2020-08-07 VITALS — BP 151/88 | HR 77 | Temp 97.6°F | Ht 64.0 in | Wt 181.0 lb

## 2020-08-07 DIAGNOSIS — I4891 Unspecified atrial fibrillation: Secondary | ICD-10-CM | POA: Diagnosis not present

## 2020-08-07 DIAGNOSIS — I10 Essential (primary) hypertension: Secondary | ICD-10-CM

## 2020-08-07 DIAGNOSIS — J40 Bronchitis, not specified as acute or chronic: Secondary | ICD-10-CM

## 2020-08-07 DIAGNOSIS — J329 Chronic sinusitis, unspecified: Secondary | ICD-10-CM | POA: Diagnosis not present

## 2020-08-07 DIAGNOSIS — Z13220 Encounter for screening for lipoid disorders: Secondary | ICD-10-CM | POA: Diagnosis not present

## 2020-08-07 LAB — COMPLETE METABOLIC PANEL WITH GFR
AG Ratio: 1.7 (calc) (ref 1.0–2.5)
ALT: 11 U/L (ref 6–29)
AST: 14 U/L (ref 10–35)
Albumin: 4.3 g/dL (ref 3.6–5.1)
Alkaline phosphatase (APISO): 49 U/L (ref 37–153)
BUN: 17 mg/dL (ref 7–25)
CO2: 25 mmol/L (ref 20–32)
Calcium: 9.2 mg/dL (ref 8.6–10.4)
Chloride: 108 mmol/L (ref 98–110)
Creat: 0.89 mg/dL (ref 0.60–1.00)
Globulin: 2.5 g/dL (calc) (ref 1.9–3.7)
Glucose, Bld: 89 mg/dL (ref 65–99)
Potassium: 3.9 mmol/L (ref 3.5–5.3)
Sodium: 141 mmol/L (ref 135–146)
Total Bilirubin: 1.9 mg/dL — ABNORMAL HIGH (ref 0.2–1.2)
Total Protein: 6.8 g/dL (ref 6.1–8.1)
eGFR: 69 mL/min/{1.73_m2} (ref >=60)

## 2020-08-07 LAB — CBC WITH DIFFERENTIAL/PLATELET
Absolute Monocytes: 376 cells/uL (ref 200–950)
Basophils Absolute: 48 cells/uL (ref 0–200)
Basophils Relative: 0.9 %
Eosinophils Absolute: 138 cells/uL (ref 15–500)
Eosinophils Relative: 2.6 %
HCT: 38.5 % (ref 35.0–45.0)
Hemoglobin: 12.7 g/dL (ref 11.7–15.5)
Lymphs Abs: 1765 cells/uL (ref 850–3900)
MCH: 30.2 pg (ref 27.0–33.0)
MCHC: 33 g/dL (ref 32.0–36.0)
MCV: 91.7 fL (ref 80.0–100.0)
MPV: 9.8 fL (ref 7.5–12.5)
Monocytes Relative: 7.1 %
Neutro Abs: 2973 cells/uL (ref 1500–7800)
Neutrophils Relative %: 56.1 %
Platelets: 170 10*3/uL (ref 140–400)
RBC: 4.2 10*6/uL (ref 3.80–5.10)
RDW: 12.9 % (ref 11.0–15.0)
Total Lymphocyte: 33.3 %
WBC: 5.3 10*3/uL (ref 3.8–10.8)

## 2020-08-07 LAB — LIPID PANEL W/REFLEX DIRECT LDL
Cholesterol: 179 mg/dL (ref <200)
HDL: 46 mg/dL — ABNORMAL LOW (ref >=50)
LDL Cholesterol (Calc): 80 mg/dL (calc)
Non-HDL Cholesterol (Calc): 133 mg/dL (calc) — ABNORMAL HIGH (ref <130)
Total CHOL/HDL Ratio: 3.9 (calc) (ref <5.0)
Triglycerides: 396 mg/dL — ABNORMAL HIGH (ref <150)

## 2020-08-07 LAB — TSH: TSH: 0.38 mIU/L — ABNORMAL LOW (ref 0.40–4.50)

## 2020-08-07 MED ORDER — PREDNISONE 20 MG PO TABS: 20.0000 mg | ORAL_TABLET | Freq: Two times a day (BID) | ORAL | 0 refills | Status: AC

## 2020-08-07 MED ORDER — DOXYCYCLINE HYCLATE 100 MG PO TABS: 100.0000 mg | ORAL_TABLET | Freq: Two times a day (BID) | ORAL | 0 refills | Status: AC

## 2020-08-07 NOTE — Patient Instructions (Signed)
Great to see you today! If cough is not improving I would like to get a chest xray.  We'll be in touch with lab results.

## 2020-08-07 NOTE — Assessment & Plan Note (Signed)
Her blood pressure is elevated in clinic today however has been well controlled at her cardiology appointments.  She will continue current medications as managed by her cardiologist.  Low-sodium diet recommended.  Update labs today.

## 2020-08-07 NOTE — Assessment & Plan Note (Signed)
Regular rate and rhythm on exam today.  She is status post ablation.  She will continue nadolol with diltiazem as needed.  Continue apixaban for anticoagulation.

## 2020-08-07 NOTE — Progress Notes (Signed)
Anna Marquez - 70 y.o. female MRN [id number]  Date of birth: 10/27/1950  Subjective Chief Complaint  Patient presents with   Hypertension    HPI Anna Marquez is a 70 year old female here today for follow-up visit.  She has history of hypertension, atrial fibrillation with RVR status post ablation, and TIA.  She also was seen recently for cough.  She was treated with a course of doxycycline and prednisone.  These did improve her symptoms however they have not fully resolved.  Her cough has been nonproductive.  She has had continued wheezing with dyspnea.  She denies fever chills  She continues to see cardiology for management of her history of A. fib and hypertension.  Her blood pressure has been well .cmcontrolled at her cardiology appointment however is little elevated here today.  Her blood pressures do fluctuate at home.  She has not had any chest pain, exertional dyspnea, palpitations or dizziness.  Her nadolol dose was decreased previously.  She does continue on diltiazem as needed.  She is also taking Eliquis for anticoagulation.  ROS:  A comprehensive ROS was completed and negative except as noted per HPI  Allergies  Allergen Reactions   Erythromycin     Upset stomach   Nsaids Other (See Comments)    Has a history of bleeding ulcers, tolerates aspirin    Penicillins Hives and Shortness Of Breath    Did it involve swelling of the face/tongue/throat, SOB, or low BP? Yes Did it involve sudden or severe rash/hives, skin peeling, or any reaction on the inside of your mouth or nose? No Did you need to seek medical attention at a hospital or doctor's office? No When did it last happen?      45 years If all above answers are "NO", may proceed with cephalosporin use.     Past Medical History:  Diagnosis Date   Adenomatous colon polyp 2003   Cholelithiasis    COVID    Diverticulosis of colon    Gastric polyps    GERD (gastroesophageal reflux disease)    Helicobacter pylori (H. pylori)     Hiatal hernia    Hypertension    Mitral valve prolapse    Nephrolithiasis    Osteoarthritis    Osteopenia 03/2017   T score -1.5 FRAX 8.9% / 0.9%.  Stable from prior DEXA 2016   Persistent atrial fibrillation (HCC)    TGA (transient global amnesia)     Past Surgical History:  Procedure Laterality Date   ANTERIOR AND POSTERIOR REPAIR N/A 06/14/2014   Procedure: ANTERIOR (CYSTOCELE) AND POSTERIOR REPAIR (RECTOCELE);  Surgeon: Anastasio Auerbach, MD;  Location: [facility];  Service: Gynecology;  Laterality: N/A;   ATRIAL FIBRILLATION ABLATION N/A 02/11/2019   Procedure: ATRIAL FIBRILLATION ABLATION;  Surgeon: Thompson Grayer, MD;  Location: [facility];  Service: Cardiovascular;  Laterality: N/A;   CARDIOVERSION N/A 01/15/2019   Procedure: CARDIOVERSION;  Surgeon: Fay Records, MD;  Location: [facility];  Service: Cardiovascular;  Laterality: N/A;   CHOLECYSTECTOMY     kidney stone removal     x2   VAGINAL HYSTERECTOMY N/A 06/14/2014   Procedure: HYSTERECTOMY VAGINAL;  Surgeon: Anastasio Auerbach, MD;  Location: [facility];  Service: Gynecology;  Laterality: N/A;    Social History   Socioeconomic History   Marital status: Divorced    Spouse name: Not on file   Number of children: 2   Years of education: 12th grade   Highest education level: High school graduate  Occupational History   Occupation: Retired,  Tobacco Use   Smoking status: Never   Smokeless tobacco: Never  Vaping Use   Vaping Use: Never used  Substance and Sexual Activity   Alcohol use: Never    Alcohol/week: 0.0 standard drinks   Drug use: No   Sexual activity: Yes    Birth control/protection: Post-menopausal    Comment: 1st intercourse 70 yo-Fewer than 5 partners  Other Topics Concern   Not on file  Social History Narrative   Lives along with her dog. She enjoys croteching, crafts, making wreaths, reading and gardening in her free time.    Social Determinants of Health   Financial Resource Strain: Low  Risk    Difficulty of Paying Living Expenses: Not hard at all  Food Insecurity: No Food Insecurity   Worried About Charity fundraiser in the Last Year: Never true   [place] in the Last Year: Never true  Transportation Needs: No Transportation Needs   Lack of Transportation (Medical): No   Lack of Transportation (Non-Medical): No  Physical Activity: Sufficiently Active   Days of Exercise per Week: 7 days   Minutes of Exercise per Session: 60 min  Stress: No Stress Concern Present   Feeling of Stress : Not at all  Social Connections: Moderately Isolated   Frequency of Communication with Friends and Family: More than three times a week   Frequency of Social Gatherings with Friends and Family: More than three times a week   Attends Religious Services: More than 4 times per year   Active Member of [employer] or Organizations: No   Attends Archivist Meetings: Never   Marital Status: Divorced    Family History  Problem Relation Age of Onset   Cancer Father        prostate   Breast cancer Paternal Aunt 48   Diabetes Maternal Grandmother    Heart disease Maternal Grandmother    Heart disease Maternal Grandfather    Melanoma Paternal Aunt    Neuropathy Mother    Congestive Heart Failure Mother    Thyroid disease Daughter     Health Maintenance  Topic Date Due   Zoster Vaccines- Shingrix (1 of 2) Never done   COVID-19 Vaccine (3 - Booster for [place] series) 08/24/2019   INFLUENZA VACCINE  08/07/2020   MAMMOGRAM  07/27/2022   COLONOSCOPY (Pts 45-7yr Insurance coverage will need to be confirmed)  03/13/2023   TETANUS/TDAP  10/29/2027   DEXA SCAN  Completed   Hepatitis C Screening  Completed   PNA vac Low Risk Adult  Completed   HPV VACCINES  Aged Out      ----------------------------------------------------------------------------------------------------------------------------------------------------------------------------------------------------------------- Physical Exam BP (!) 151/88 (BP Location: Left Arm, Patient Position: Sitting, Cuff Size: Normal)   Pulse 77   Temp 97.6 F (36.4 C)   Ht '5\' 4"'$  (1.626 m)   Wt 181 lb (82.1 kg)   SpO2 97%   BMI 31.07 kg/m   Physical Exam Constitutional:      Appearance: Normal appearance.  HENT:     Head: Normocephalic and atraumatic.  Eyes:     General: No scleral icterus. Cardiovascular:     Rate and Rhythm: Normal rate and regular rhythm.  Pulmonary:     Effort: Pulmonary effort is normal.     Breath sounds: Normal breath sounds.  Musculoskeletal:     Cervical back: Neck supple.  Skin:    General: Skin is warm and dry.  Neurological:  Mental Status: She is alert.  Psychiatric:        Mood and Affect: Mood normal.        Behavior: Behavior normal.    ------------------------------------------------------------------------------------------------------------------------------------------------------------------------------------------------------------------- Assessment and Plan  Essential hypertension Her blood pressure is elevated in clinic today however has been well controlled at her cardiology appointments.  She will continue current medications as managed by her cardiologist.  Low-sodium diet recommended.  Update labs today.  Atrial fibrillation with RVR (HCC) Regular rate and rhythm on exam today.  She is status post ablation.  She will continue nadolol with diltiazem as needed.  Continue apixaban for anticoagulation.  Sinobronchitis She continues to have some wheezing along with sinus pressure and increased headaches.  Repeat course of doxycycline as well as prednisone.  If not improving with this we will plan to get chest x-ray.   Meds ordered this encounter   Medications   doxycycline (VIBRA-TABS) 100 MG tablet    Sig: Take 1 tablet (100 mg total) by mouth 2 (two) times daily for 10 days.    Dispense:  20 tablet    Refill:  0   predniSONE (DELTASONE) 20 MG tablet    Sig: Take 1 tablet (20 mg total) by mouth 2 (two) times daily with a meal for 5 days.    Dispense:  10 tablet    Refill:  0    Return in about 6 months (around 02/07/2021) for HTN.    This visit occurred during the SARS-CoV-2 public health emergency.  Safety protocols were in place, including screening questions prior to the visit, additional usage of staff PPE, and extensive cleaning of exam room while observing appropriate contact time as indicated for disinfecting solutions.

## 2020-08-07 NOTE — Assessment & Plan Note (Signed)
She continues to have some wheezing along with sinus pressure and increased headaches.  Repeat course of doxycycline as well as prednisone.  If not improving with this we will plan to get chest x-ray.

## 2020-09-01 ENCOUNTER — Encounter: Payer: Self-pay | Admitting: Family Medicine

## 2020-09-01 ENCOUNTER — Other Ambulatory Visit: Payer: Self-pay

## 2020-09-01 ENCOUNTER — Ambulatory Visit (INDEPENDENT_AMBULATORY_CARE_PROVIDER_SITE_OTHER): Payer: PPO | Admitting: Family Medicine

## 2020-09-01 ENCOUNTER — Ambulatory Visit (INDEPENDENT_AMBULATORY_CARE_PROVIDER_SITE_OTHER): Payer: PPO

## 2020-09-01 VITALS — BP 136/82 | HR 77 | Resp 17

## 2020-09-01 DIAGNOSIS — R1084 Generalized abdominal pain: Secondary | ICD-10-CM | POA: Diagnosis not present

## 2020-09-01 DIAGNOSIS — R300 Dysuria: Secondary | ICD-10-CM | POA: Diagnosis not present

## 2020-09-01 DIAGNOSIS — R109 Unspecified abdominal pain: Secondary | ICD-10-CM

## 2020-09-01 LAB — POCT URINALYSIS DIP (CLINITEK)
Bilirubin, UA: NEGATIVE
Blood, UA: NEGATIVE
Glucose, UA: NEGATIVE mg/dL
Ketones, POC UA: NEGATIVE mg/dL
Leukocytes, UA: NEGATIVE
Nitrite, UA: NEGATIVE
POC PROTEIN,UA: NEGATIVE
Spec Grav, UA: 1.025 (ref 1.010–1.025)
Urobilinogen, UA: 0.2 E.U./dL
pH, UA: 5.5 (ref 5.0–8.0)

## 2020-09-01 MED ORDER — TRAMADOL HCL 50 MG PO TABS: 50.0000 mg | ORAL_TABLET | Freq: Three times a day (TID) | ORAL | 0 refills | Status: AC | PRN

## 2020-09-01 NOTE — Patient Instructions (Signed)
Urine was normal Let's start with an x-ray to see if they can visualize any abnormal findings - will let you know plan once results are in

## 2020-09-01 NOTE — Progress Notes (Signed)
Acute Office Visit  Subjective:    Patient ID: Anna Marquez, female    DOB: [birth year], 70 y.o.   MRN: [id number]  Chief Complaint  Patient presents with   Flank Pain    HPI Patient is in today for left flank pain.  Patient states earlier this month she woke up with abdominal pain that transitioned to more left flank pain with hematuria. She knows she has a history of kidney stones so she started filtering her urine and did up passing several small stones. She states that she has not noticed any more blood recently, but her left flank pain has returned and is more deep/dull, making it painful to even move positions such as getting up out of bed or from a chair. The pain is primarily left lower back and left flank pain. Reports the pain feels like a mix between kidney stone pain and ovarian cyst pain - reports she had an ovarian cyst many years ago, but has since had partial hysterectomy with ovaries remaining. Additionally, she reports she has had several days of dysuria, frequency, urgency. She has not noticed any urinary color changes, odor, vaginal discharge, pain, itching, other abdominal pains, fevers, body aches, nausea, vomiting, diarrhea, blood in stool, unusual fatigue.     Past Medical History:  Diagnosis Date   Adenomatous colon polyp 2003   Cholelithiasis    COVID    Diverticulosis of colon    Gastric polyps    GERD (gastroesophageal reflux disease)    Helicobacter pylori (H. pylori)    Hiatal hernia    Hypertension    Mitral valve prolapse    Nephrolithiasis    Osteoarthritis    Osteopenia 03/2017   T score -1.5 FRAX 8.9% / 0.9%.  Stable from prior DEXA 2016   Persistent atrial fibrillation (HCC)    TGA (transient global amnesia)     Past Surgical History:  Procedure Laterality Date   ANTERIOR AND POSTERIOR REPAIR N/A 06/14/2014   Procedure: ANTERIOR (CYSTOCELE) AND POSTERIOR REPAIR (RECTOCELE);  Surgeon: Anastasio Auerbach, MD;  Location: [facility];  Service:  Gynecology;  Laterality: N/A;   ATRIAL FIBRILLATION ABLATION N/A 02/11/2019   Procedure: ATRIAL FIBRILLATION ABLATION;  Surgeon: Thompson Grayer, MD;  Location: [facility];  Service: Cardiovascular;  Laterality: N/A;   CARDIOVERSION N/A 01/15/2019   Procedure: CARDIOVERSION;  Surgeon: Fay Records, MD;  Location: [facility];  Service: Cardiovascular;  Laterality: N/A;   CHOLECYSTECTOMY     kidney stone removal     x2   VAGINAL HYSTERECTOMY N/A 06/14/2014   Procedure: HYSTERECTOMY VAGINAL;  Surgeon: Anastasio Auerbach, MD;  Location: [facility];  Service: Gynecology;  Laterality: N/A;    Family History  Problem Relation Age of Onset   Cancer Father        prostate   Breast cancer Paternal Aunt 42   Diabetes Maternal Grandmother    Heart disease Maternal Grandmother    Heart disease Maternal Grandfather    Melanoma Paternal Aunt    Neuropathy Mother    Congestive Heart Failure Mother    Thyroid disease Daughter     Social History   Socioeconomic History   Marital status: Divorced    Spouse name: Not on file   Number of children: 2   Years of education: 12th grade   Highest education level: High school graduate  Occupational History   Occupation: Retired,  Tobacco Use   Smoking status: Never   Smokeless tobacco: Never  Vaping Use   Vaping Use: Never used  Substance and Sexual Activity   Alcohol use: Never    Alcohol/week: 0.0 standard drinks   Drug use: No   Sexual activity: Yes    Birth control/protection: Post-menopausal    Comment: 1st intercourse 70 yo-Fewer than 5 partners  Other Topics Concern   Not on file  Social History Narrative   Lives along with her dog. She enjoys croteching, crafts, making wreaths, reading and gardening in her free time.    Social Determinants of Health   Financial Resource Strain: Low Risk    Difficulty of Paying Living Expenses: Not hard at all  Food Insecurity: No Food Insecurity   Worried About Charity fundraiser in the Last  Year: Never true   [place] in the Last Year: Never true  Transportation Needs: No Transportation Needs   Lack of Transportation (Medical): No   Lack of Transportation (Non-Medical): No  Physical Activity: Sufficiently Active   Days of Exercise per Week: 7 days   Minutes of Exercise per Session: 60 min  Stress: No Stress Concern Present   Feeling of Stress : Not at all  Social Connections: Moderately Isolated   Frequency of Communication with Friends and Family: More than three times a week   Frequency of Social Gatherings with Friends and Family: More than three times a week   Attends Religious Services: More than 4 times per year   Active Member of [employer] or Organizations: No   Attends Archivist Meetings: Never   Marital Status: Divorced  Human resources officer Violence: Not At Risk   Fear of Current or Ex-Partner: No   Emotionally Abused: No   Physically Abused: No   Sexually Abused: No    Outpatient Medications Prior to Visit  Medication Sig Dispense Refill   acetaminophen (TYLENOL) 500 MG tablet Take 1,000 mg by mouth every 6 (six) hours as needed for moderate pain or headache.     Ascorbic Acid (VITAMIN C) 1000 MG tablet Take 1,000 mg by mouth daily.     Cholecalciferol (VITAMIN D) 50 MCG (2000 UT) tablet Take 2,000 Units by mouth daily.     diclofenac Sodium (VOLTAREN) 1 % GEL Apply topically 4 (four) times daily.     diltiazem (CARDIZEM) 30 MG tablet TAKE 1 TABLET EVERY 4 HOURS AS NEEDED FOR HEART RATE GREATER THAN 100 45 tablet 1   ELIQUIS 5 MG TABS tablet TAKE 1 TABLET BY MOUTH 2 TIMES DAILY. 60 tablet 5   ferrous sulfate 325 (65 FE) MG tablet Take 325 mg by mouth every other day.     fluticasone (FLONASE) 50 MCG/ACT nasal spray PLACE 1 SPRAY INTO BOTH NOSTRILS AS NEEDED FOR ALLERGIES OR RHINITIS. 16 g 6   furosemide (LASIX) 20 MG tablet TAKE 1 TABLET (20 MG TOTAL) BY MOUTH DAILY AS NEEDED FOR EDEMA. 15 tablet 1   HYDROcodone bit-homatropine (HYCODAN) 5-1.5  MG/5ML syrup Take 5 mLs by mouth every 6 (six) hours as needed for cough. 120 mL 0   lisinopril (ZESTRIL) 20 MG tablet Take 1 tablet (20 mg total) by mouth daily. Please keep upcoming appt in July 2022 with Dr. Rayann Heman before anymore refills. Thank you 90 tablet 0   loratadine (CLARITIN) 10 MG tablet Take 10 mg by mouth daily.      nadolol (CORGARD) 40 MG tablet Take 1 tablet (40 mg total) by mouth 2 (two) times daily. 180 tablet 3   pantoprazole (PROTONIX) 40 MG  tablet TAKE 1 TABLET (40 MG TOTAL) BY MOUTH DAILY. 90 tablet 4   potassium chloride (KLOR-CON) 10 MEQ tablet TAKE 1 TABLET BY MOUTH DAILY AS NEEDED (TAKE WITH HCTZ). 30 tablet 6   PROAIR HFA 108 (90 Base) MCG/ACT inhaler INHALE 1 TO 2 PUFFS INTO THE LUNGS EVERY 6 HOURS AS NEEDED FOR WHEEZING OR SHORTNESS OF BREATH. 8.5 g 3   Probiotic Product (PROBIOTIC PO) Take 1 tablet by mouth daily.     vitamin E 180 MG (400 UNITS) capsule Take 400 Units by mouth daily.     ZINC CITRATE PO Take 22 mg by mouth in the morning and at bedtime.     No facility-administered medications prior to visit.    Allergies  Allergen Reactions   Erythromycin     Upset stomach   Nsaids Other (See Comments)    Has a history of bleeding ulcers, tolerates aspirin    Penicillins Hives and Shortness Of Breath    Did it involve swelling of the face/tongue/throat, SOB, or low BP? Yes Did it involve sudden or severe rash/hives, skin peeling, or any reaction on the inside of your mouth or nose? No Did you need to seek medical attention at a hospital or doctor's office? No When did it last happen?      45 years If all above answers are "NO", may proceed with cephalosporin use.     Review of Systems All review of systems negative except what is listed in the HPI     Objective:    Physical Exam Vitals reviewed.  Constitutional:      Appearance: Normal appearance.  Cardiovascular:     Rate and Rhythm: Normal rate and regular rhythm.     Heart sounds: Normal  heart sounds.  Pulmonary:     Effort: Pulmonary effort is normal.     Breath sounds: Normal breath sounds.  Abdominal:     General: Bowel sounds are normal.     Palpations: Abdomen is soft.     Tenderness: There is no abdominal tenderness. There is no right CVA tenderness, left CVA tenderness or guarding.  Skin:    General: Skin is warm and dry.     Findings: No rash.  Neurological:     Mental Status: She is alert and oriented to person, place, and time.  Psychiatric:        Mood and Affect: Mood normal.        Behavior: Behavior normal.        Thought Content: Thought content normal.        Judgment: Judgment normal.    BP 136/82   Pulse 77   Resp 17   SpO2 96%  Wt Readings from Last 3 Encounters:  08/07/20 181 lb (82.1 kg)  07/25/20 170 lb (77.1 kg)  07/12/20 174 lb (78.9 kg)    Health Maintenance Due  Topic Date Due   Zoster Vaccines- Shingrix (1 of 2) Never done   COVID-19 Vaccine (3 - Booster for [employer] series) 08/24/2019   INFLUENZA VACCINE  08/07/2020    There are no preventive care reminders to display for this patient.   Lab Results  Component Value Date   TSH 0.38 (L) 08/07/2020   Lab Results  Component Value Date   WBC 5.3 08/07/2020   HGB 12.7 08/07/2020   HCT 38.5 08/07/2020   MCV 91.7 08/07/2020   PLT 170 08/07/2020   Lab Results  Component Value Date   NA 141 08/07/2020  K 3.9 08/07/2020   CO2 25 08/07/2020   GLUCOSE 89 08/07/2020   BUN 17 08/07/2020   CREATININE 0.89 08/07/2020   BILITOT 1.9 (H) 08/07/2020   ALKPHOS 52 07/26/2019   AST 14 08/07/2020   ALT 11 08/07/2020   PROT 6.8 08/07/2020   ALBUMIN 4.5 07/26/2019   CALCIUM 9.2 08/07/2020   ANIONGAP 12 07/26/2019   EGFR 69 08/07/2020   GFR 56.81 (L) 11/11/2018   Lab Results  Component Value Date   CHOL 179 08/07/2020   Lab Results  Component Value Date   HDL 46 (L) 08/07/2020   Lab Results  Component Value Date   LDLCALC 80 08/07/2020   Lab Results  Component Value  Date   TRIG 396 (H) 08/07/2020   Lab Results  Component Value Date   CHOLHDL 3.9 08/07/2020   Lab Results  Component Value Date   HGBA1C 5.1 08/21/2015       Assessment & Plan:  1. Acute left flank pain 2. Dysuria Patient with flank pain consistent with her history of nephrolithiasis but no hematuria - will start with KUB. Educated her that if pain became more severe or more localized to abdomen/pelvis to please let us know. Will give her a few days of tramadol for pain relief and let her know imaging results. Encouraged adequate hydration. Patient aware of signs/symptoms requiring further/urgent evaluation.   - DG Abd 1 View; Future - traMADol (ULTRAM) 50 MG tablet; Take 1 tablet (50 mg total) by mouth every 8 (eight) hours as needed for up to 5 days.  Dispense: 15 tablet; Refill: 0 - POCT URINALYSIS DIP (CLINITEK)  Follow-up pending results or as needed.   Purcell Nails Olevia Bowens, DNP, FNP-C

## 2020-09-04 NOTE — Progress Notes (Signed)
MyChart Message sent: Your abdominal xray was negative overall. There were some likely phleboliths which are common and usually asymptomatic. If you are not feeling any better or have had changes in your symptoms please let us know.

## 2020-10-02 ENCOUNTER — Other Ambulatory Visit (HOSPITAL_COMMUNITY): Payer: Self-pay | Admitting: Nurse Practitioner

## 2020-10-02 ENCOUNTER — Other Ambulatory Visit: Payer: Self-pay | Admitting: Internal Medicine

## 2020-10-08 ENCOUNTER — Encounter: Payer: Self-pay | Admitting: Cardiovascular Disease

## 2020-10-08 NOTE — Progress Notes (Signed)
Anna Marquez Date of Birth  09-23-1950       [facility] [address], [address], [place], [place]  [address]   [place], [place]  [address] [phone]     [phone]   Fax  [phone]    Fax [phone]  Problem List: 1. Paroxysmal atrial fibrillation - documented Dec. 2013.   CHADS2VASC = 3 ( female, age, HTN)  -  2. Hypertension -    3.  Mitral Valve prolapse 4. Asthma 5, GERD   Notes from 2014: Anna Marquez is a 70 year old female with a history of mitral valve prolapse and hypertension in the past. She recently presented to the hospital with palpitations.  She woke up with palpitations.  She drinks alcohol only very rarely.  She was found to have atrial fibrillation with a rapid ventricular response. She taken an extra Corgard prior to coming to the emergency room. She converted to sinus rhythm while in the emergency room. Followup EKG revealed normal sinus rhythm.  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 She tries to exercise on a daily basis.  She has had occasional palpitations that sound like PVCs .    April 27, 2012:  Anna Marquez is doing well.  No palpitations.  Exercising regularly.  No complaints  April 03, 2016:  Anna Marquez was recently admitted to the hospital with chest pain. She has a history of paroxysmal atrial fibrillation, hypertension, mitral valve prolapse, asthma, and gastroesophageal reflux disease.  She had a treadmill status which was negative for ischemia. She has normal left ventricular systolic function by echo.  She walks regularly .  Has had some CP that she thinks is reflux.   Is taking PPI   Is retired from [employer]   May 23, 2017: Anna Marquez is seen today for follow-up of her paroxysmal atrial for ablation, hypertension, mitral valve prolapse, and asthma.  She was seen last year. Has some palpitations.  Last for a few minutes. Also has some HR  irregularities that last for several hours.  These typically get better if she takes another Corgard.   Aug. 24, 2021:  Has hx of PAF . Had Afib ablation Feb. 3, ( [facility])  Echo showed normal left ventricular systolic function.  She has mild mitral valve prolapse with mild to moderate mitral regurgitation. Is feeling better .  Has some right thigh cranping  Distal Pulses are great  Suggested that she might be volume depleted or have low potassium  She needs to have a renogram to look for scarring in her ureters. If she needs to have subsequent urologic surgery, it will be okay for her to hold her Eliquis for 2 to 3 days prior to the procedure.  She is at low risk for the upcoming procedure if needed.  She is off tikosyn now .   Doing great.    Oct. 3, 2022 Anna Marquez is seen for follow up of her MVP and PAF. Has occasional palpitations  BP is slightly elevated.  May be eating a bit more salt than usual  Has been eating out more often  Some ayptical CP  These may last an hour Has a hiatal hernia  The pains do not occur with walking .  Labs from Aug. Show very elevated trig level - 396. She admits that she eats lots of carbohydrates including bread with each meal, lots of rice, pasta,  potatoes.    Current Outpatient Medications on File Prior to Visit  Medication Sig Dispense Refill   acetaminophen (TYLENOL) 500 MG tablet Take 1,000 mg by mouth every 6 (six) hours as needed for moderate pain or headache.     Ascorbic Acid (VITAMIN C) 1000 MG tablet Take 1,000 mg by mouth daily.     Cholecalciferol (VITAMIN D) 50 MCG (2000 UT) tablet Take 2,000 Units by mouth daily.     diclofenac Sodium (VOLTAREN) 1 % GEL Apply topically 4 (four) times daily.     diltiazem (CARDIZEM) 30 MG tablet TAKE 1 TABLET EVERY 4 HOURS AS NEEDED FOR HEART RATE GREATER THAN 100 45 tablet 1   ELIQUIS 5 MG TABS tablet TAKE 1 TABLET BY MOUTH 2 TIMES DAILY. 60 tablet 5   ferrous sulfate 325 (65 FE) MG tablet Take 325  mg by mouth every other day.     fluticasone (FLONASE) 50 MCG/ACT nasal spray PLACE 1 SPRAY INTO BOTH NOSTRILS AS NEEDED FOR ALLERGIES OR RHINITIS. 16 g 6   furosemide (LASIX) 20 MG tablet TAKE 1 TABLET (20 MG TOTAL) BY MOUTH DAILY AS NEEDED FOR EDEMA. 30 tablet 1   lisinopril (ZESTRIL) 20 MG tablet TAKE 1 TABLET (20 MG TOTAL) BY MOUTH DAILY. 90 tablet 3   loratadine (CLARITIN) 10 MG tablet Take 10 mg by mouth daily.      nadolol (CORGARD) 40 MG tablet Take 1 tablet (40 mg total) by mouth 2 (two) times daily. 180 tablet 3   pantoprazole (PROTONIX) 40 MG tablet TAKE 1 TABLET (40 MG TOTAL) BY MOUTH DAILY. 90 tablet 4   potassium chloride (KLOR-CON) 10 MEQ tablet TAKE 1 TABLET BY MOUTH DAILY AS NEEDED (TAKE WITH HCTZ). 30 tablet 6   PROAIR HFA 108 (90 Base) MCG/ACT inhaler INHALE 1 TO 2 PUFFS INTO THE LUNGS EVERY 6 HOURS AS NEEDED FOR WHEEZING OR SHORTNESS OF BREATH. 8.5 g 3   Probiotic Product (PROBIOTIC PO) Take 1 tablet by mouth daily.     vitamin E 180 MG (400 UNITS) capsule Take 400 Units by mouth daily.     ZINC CITRATE PO Take 22 mg by mouth in the morning and at bedtime.     HYDROcodone bit-homatropine (HYCODAN) 5-1.5 MG/5ML syrup Take 5 mLs by mouth every 6 (six) hours as needed for cough. (Patient not taking: Reported on 10/09/2020) 120 mL 0   No current facility-administered medications on file prior to visit.    Allergies  Allergen Reactions   Erythromycin     Upset stomach   Nsaids Other (See Comments)    Has a history of bleeding ulcers, tolerates aspirin    Penicillins Hives and Shortness Of Breath    Did it involve swelling of the face/tongue/throat, SOB, or low BP? Yes Did it involve sudden or severe rash/hives, skin peeling, or any reaction on the inside of your mouth or nose? No Did you need to seek medical attention at a hospital or doctor's office? No When did it last happen?      45 years If all above answers are "NO", may proceed with cephalosporin use.     Past  Medical History:  Diagnosis Date   Adenomatous colon polyp 2003   Cholelithiasis    COVID    Diverticulosis of colon    Gastric polyps    GERD (gastroesophageal reflux disease)    Helicobacter pylori (H. pylori)    Hiatal hernia    Hypertension    Mitral valve prolapse  Nephrolithiasis    Osteoarthritis    Osteopenia 03/2017   T score -1.5 FRAX 8.9% / 0.9%.  Stable from prior DEXA 2016   Persistent atrial fibrillation (HCC)    TGA (transient global amnesia)     Past Surgical History:  Procedure Laterality Date   ANTERIOR AND POSTERIOR REPAIR N/A 06/14/2014   Procedure: ANTERIOR (CYSTOCELE) AND POSTERIOR REPAIR (RECTOCELE);  Surgeon: Anastasio Auerbach, MD;  Location: [facility];  Service: Gynecology;  Laterality: N/A;   ATRIAL FIBRILLATION ABLATION N/A 02/11/2019   Procedure: ATRIAL FIBRILLATION ABLATION;  Surgeon: Thompson Grayer, MD;  Location: [facility];  Service: Cardiovascular;  Laterality: N/A;   CARDIOVERSION N/A 01/15/2019   Procedure: CARDIOVERSION;  Surgeon: Fay Records, MD;  Location: [facility];  Service: Cardiovascular;  Laterality: N/A;   CHOLECYSTECTOMY     kidney stone removal     x2   VAGINAL HYSTERECTOMY N/A 06/14/2014   Procedure: HYSTERECTOMY VAGINAL;  Surgeon: Anastasio Auerbach, MD;  Location: [facility];  Service: Gynecology;  Laterality: N/A;    Social History   Tobacco Use  Smoking Status Never  Smokeless Tobacco Never    Social History   Substance and Sexual Activity  Alcohol Use Never   Alcohol/week: 0.0 standard drinks    Family History  Problem Relation Age of Onset   Cancer Father        prostate   Breast cancer Paternal Aunt 34   Diabetes Maternal Grandmother    Heart disease Maternal Grandmother    Heart disease Maternal Grandfather    Melanoma Paternal Aunt    Neuropathy Mother    Congestive Heart Failure Mother    Thyroid disease Daughter     Reviw of Systems:  Reviewed in the HPI.  All other systems are  negative.   Physical Exam: Blood pressure (!) 150/84, pulse 78, height 5\' 4"  (1.626 m), weight 173 lb 6.4 oz (78.7 kg), SpO2 97 %.  GEN:  Well nourished, well developed in no acute distress HEENT: Normal NECK: No JVD; No carotid bruits LYMPHATICS: No lymphadenopathy CARDIAC: RRR , no murmurs, gallops  RESPIRATORY:  Clear to auscultation without rales, wheezing or rhonchi  ABDOMEN: Soft, non-tender, non-distended MUSCULOSKELETAL:  No edema; No deformity  SKIN: Warm and dry NEUROLOGIC:  Alert and oriented x 3    ECG:    Assessment / Plan:   1. Paroxysmal atrial fibrillation - documented Dec. 2013.   CHADS2VASC = 3 ( female, age, HTN)  -     She is status post A. fib ablation.   She is maintaining sinus rhythm.  Seems to be doing well.  2. Hypertension - Blood pressure is a bit higher.  She has not been eating quite as carefully as she should be.  She will work on reducing her salt and also will reduce her carbohydrates in an effort to lose some weight.  3.  Hyperlipidemia: Her triglycerides are very high.  She admits to eating too many carbs.  She will work on improving her diet and work on some weight loss.   4. Asthma 5, GERD      Mertie Moores, MD  10/09/2020 2:51 PM    [facility] [place],  [place], [place]  [address] Pager [phone] Phone: [phone]; Fax: [phone]

## 2020-10-09 ENCOUNTER — Ambulatory Visit: Payer: PPO | Admitting: Cardiovascular Disease

## 2020-10-09 ENCOUNTER — Encounter: Payer: Self-pay | Admitting: Cardiovascular Disease

## 2020-10-09 ENCOUNTER — Other Ambulatory Visit: Payer: Self-pay

## 2020-10-09 VITALS — BP 150/84 | HR 78 | Ht 64.0 in | Wt 173.4 lb

## 2020-10-09 DIAGNOSIS — I10 Essential (primary) hypertension: Secondary | ICD-10-CM | POA: Diagnosis not present

## 2020-10-09 DIAGNOSIS — E785 Hyperlipidemia, unspecified: Secondary | ICD-10-CM | POA: Diagnosis not present

## 2020-10-09 NOTE — Patient Instructions (Addendum)
Medication Instructions:  Your physician recommends that you continue on your current medications as directed. Please refer to the Current Medication list given to you today.  *If you need a refill on your cardiac medications before your next appointment, please call your pharmacy*   Lab Work: IN [place] NEXT Office Visit  If you have labs (blood work) drawn today and your tests are completely normal, you will receive your results only by: [place] (if you have MyChart) OR A paper copy in the mail If you have any lab test that is abnormal or we need to change your treatment, we will call you to review the results.   Testing/Procedures: NONE   Follow-Up: At [facility], you and your health needs are our priority.  As part of our continuing mission to provide you with exceptional heart care, we have created designated Provider Care Teams.  These Care Teams include your primary Cardiologist (physician) and Advanced Practice Providers (APPs -  Physician Assistants and Nurse Practitioners) who all work together to provide you with the care you need, when you need it.   Your next appointment:   6 month(s)  The format for your next appointment:   In Person  Provider:   You may see Mertie Moores, MD or one of the following Advanced Practice Providers on your designated Care Team:   Richardson Dopp, PA-C [place], [place]

## 2020-11-29 ENCOUNTER — Encounter: Payer: Self-pay | Admitting: Family Medicine

## 2020-12-06 ENCOUNTER — Telehealth (INDEPENDENT_AMBULATORY_CARE_PROVIDER_SITE_OTHER): Payer: PPO | Admitting: Physician Assistant

## 2020-12-06 ENCOUNTER — Encounter: Payer: Self-pay | Admitting: Physician Assistant

## 2020-12-06 ENCOUNTER — Other Ambulatory Visit: Payer: Self-pay | Admitting: Internal Medicine

## 2020-12-06 VITALS — BP 119/79 | HR 66 | Temp 101.0°F | Wt 169.0 lb

## 2020-12-06 DIAGNOSIS — J329 Chronic sinusitis, unspecified: Secondary | ICD-10-CM | POA: Diagnosis not present

## 2020-12-06 DIAGNOSIS — J40 Bronchitis, not specified as acute or chronic: Secondary | ICD-10-CM

## 2020-12-06 MED ORDER — DOXYCYCLINE HYCLATE 100 MG PO TABS: 100.0000 mg | ORAL_TABLET | Freq: Two times a day (BID) | ORAL | 0 refills | Status: DC

## 2020-12-06 MED ORDER — PREDNISONE 50 MG PO TABS: ORAL_TABLET | ORAL | 0 refills | Status: DC

## 2020-12-06 MED ORDER — HYDROCODONE BIT-HOMATROP MBR 5-1.5 MG/5ML PO SOLN: 5.0000 mL | Freq: Four times a day (QID) | ORAL | 0 refills | Status: DC | PRN

## 2020-12-06 NOTE — Telephone Encounter (Signed)
Eliquis 5mg  refill request received. Patient is 70 years old, weight-78.7kg, Crea-0.89 on 08/07/2020, Diagnosis-Afib, and last seen by Dr. Acie Fredrickson on 10/09/2020. Dose is appropriate based on dosing criteria. Will send in refill to requested pharmacy.

## 2020-12-06 NOTE — Progress Notes (Signed)
..Virtual Visit via Telephone Note  I connected with Anna Marquez on 12/06/20 at  3:00 PM EST by telephone and verified that I am speaking with the correct person using two identifiers.  Location: Patient: home Provider: clinic  .Anna KitchenParticipating in visit:  Patient: Anna Marquez Provider:Moani Weipert Alden Hipp PA-C   I discussed the limitations, risks, security and privacy concerns of performing an evaluation and management service by telephone and the availability of in person appointments. I also discussed with the patient that there may be a patient responsible charge related to this service. The patient expressed understanding and agreed to proceed.   History of Present Illness: Pt is a 70 yo female with allergic rhinitis, HTN, Afib who presents to the clinic with 1 week of URI symptoms with the worst being cough and sinus pressure/drainage. Anna Marquez was sick with cold. Using tylenol, albuterol, flonase with little relief. Cough is keeping her up at night. Running low grade fever. No body aches or SOB. Has had flu and covid vaccine x2.  .. Active Ambulatory Problems    Diagnosis Date Noted   Migraine without aura 05/20/2008   Mitral regurgitation 08/10/2007   Allergic rhinitis 05/20/2008   GERD 08/10/2007   Diaphragmatic hernia 08/10/2007   DIVERTICULOSIS, COLON 08/10/2007   Osteoarthritis 08/10/2007   COLONIC POLYPS, ADENOMATOUS, HX OF 08/10/2007   A-fib ([place]) 01/09/2012   Essential hypertension 01/27/2012   Hypokalemia 03/07/2016   TIA (transient ischemic attack) 03/07/2016   Cough 11/21/2016   Screening for lipid disorders 10/28/2017   Anemia 02/04/2018   Recurrent nephrolithiasis 02/04/2018   Atrial fibrillation with RVR ([place]) 03/16/2018   Hypomagnesemia 03/25/2018   Seasonal allergic rhinitis due to pollen 09/25/2018   Reactive airway disease 09/25/2018   Sinobronchitis 02/01/2019   Resolved Ambulatory Problems    Diagnosis Date Noted   DYSPLASTIC NEVUS, CHEST 11/14/2009    GASTRITIS, CHRONIC 08/10/2007   CHOLELITHIASIS 08/10/2007   UTI 03/21/2009   HEMATURIA UNSPECIFIED 03/21/2009   BARTHOLIN'S CYST, RIGHT 09/07/2007   OVARIAN CYST 08/10/2007   Abdominal pain, epigastric 23/55/7322   HELICOBACTER PYLORI INFECTION, HX OF 08/10/2007   NEPHROLITHIASIS, HX OF 08/10/2007   Abnormal results of thyroid function studies 02/18/2012   Influenza with other respiratory manifestations 01/11/2013   Acute bacterial bronchitis 01/23/2013   Cystocele with uterine prolapse 02/25/2013   Uterine prolapse 06/14/2014   Transient global amnesia 08/21/2015   Acute encephalopathy 08/21/2015   Left flank pain 08/21/2015   Hematuria 12/06/2015   Chest pain 03/07/2016   Palpitations 01/31/2017   Acute cystitis 10/22/2017   Acute gastroenteritis 10/22/2017   Adhesive capsulitis 02/04/2018   Viral upper respiratory tract infection 03/16/2018   Rash 06/25/2018   Past Medical History:  Diagnosis Date   Adenomatous colon polyp 2003   Cholelithiasis    COVID    Diverticulosis of colon    Gastric polyps    GERD (gastroesophageal reflux disease)    Helicobacter pylori (H. pylori)    Hiatal hernia    Hypertension    Mitral valve prolapse    Nephrolithiasis    Osteopenia 03/2017   Persistent atrial fibrillation (HCC)    TGA (transient global amnesia)       Observations/Objective: No acute distress No labored breathing Productive cough Normal mood  .Anna Marquez Today's Vitals   12/06/20 1506  BP: 119/79  Pulse: 66  Temp: (!) 101 F (38.3 C)  TempSrc: Oral  Weight: 169 lb (76.7 kg)   Body mass index is 29.01 kg/m.    Assessment  and Plan: Anna KitchenMarland KitchenLadiamond was seen today for flu-like symptoms.  Diagnoses and all orders for this visit:  Sinobronchitis -     HYDROcodone bit-homatropine (HYCODAN) 5-1.5 MG/5ML syrup; Take 5 mLs by mouth every 6 (six) hours as needed for cough. -     doxycycline (VIBRA-TABS) 100 MG tablet; Take 1 tablet (100 mg total) by mouth 2 (two) times  daily. -     predniSONE (DELTASONE) 50 MG tablet; One tab PO daily for 5 days.   Negative home covid test Out of window for flu with 1 week of symptoms Sent prednisone/doxy/hycodan. Allergy to PCN and emycin. Discussed symptomatic care Rest and hydrate Follow up as needed and if symptoms worsen   Follow Up Instructions:    I discussed the assessment and treatment plan with the patient. The patient was provided an opportunity to ask questions and all were answered. The patient agreed with the plan and demonstrated an understanding of the instructions.   The patient was advised to call back or seek an in-person evaluation if the symptoms worsen or if the condition fails to improve as anticipated.  I provided 15 minutes of non-face-to-face time during this encounter.   Anna Planas, PA-C

## 2021-01-10 ENCOUNTER — Ambulatory Visit: Payer: PPO | Admitting: Student

## 2021-01-10 ENCOUNTER — Encounter: Payer: Self-pay | Admitting: Student

## 2021-01-10 ENCOUNTER — Other Ambulatory Visit: Payer: Self-pay

## 2021-01-10 VITALS — BP 152/100 | HR 74 | Ht 64.0 in | Wt 171.0 lb

## 2021-01-10 DIAGNOSIS — I43 Cardiomyopathy in diseases classified elsewhere: Secondary | ICD-10-CM

## 2021-01-10 DIAGNOSIS — E785 Hyperlipidemia, unspecified: Secondary | ICD-10-CM

## 2021-01-10 DIAGNOSIS — I4819 Other persistent atrial fibrillation: Secondary | ICD-10-CM | POA: Diagnosis not present

## 2021-01-10 DIAGNOSIS — I341 Nonrheumatic mitral (valve) prolapse: Secondary | ICD-10-CM | POA: Diagnosis not present

## 2021-01-10 DIAGNOSIS — I10 Essential (primary) hypertension: Secondary | ICD-10-CM | POA: Diagnosis not present

## 2021-01-10 DIAGNOSIS — R000 Tachycardia, unspecified: Secondary | ICD-10-CM | POA: Diagnosis not present

## 2021-01-10 NOTE — Patient Instructions (Signed)
Medication Instructions:  Your physician recommends that you continue on your current medications as directed. Please refer to the Current Medication list given to you today.  *If you need a refill on your cardiac medications before your next appointment, please call your pharmacy*   Lab Work: TODAY: BMET, CBC  If you have labs (blood work) drawn today and your tests are completely normal, you will receive your results only by: [place] (if you have MyChart) OR A paper copy in the mail If you have any lab test that is abnormal or we need to change your treatment, we will call you to review the results.   Testing/Procedures: Your physician has requested that you have an echocardiogram. Echocardiography is a painless test that uses sound waves to create images of your heart. It provides your doctor with information about the size and shape of your heart and how well your hearts chambers and valves are working. This procedure takes approximately one hour. There are no restrictions for this procedure.   Follow-Up: At [facility], you and your health needs are our priority.  As part of our continuing mission to provide you with exceptional heart care, we have created designated Provider Care Teams.  These Care Teams include your primary Cardiologist (physician) and Advanced Practice Providers (APPs -  Physician Assistants and Nurse Practitioners) who all work together to provide you with the care you need, when you need it.   Your next appointment:   6 month(s)  The format for your next appointment:   In Person  Provider:   You may see Thompson Grayer, MD or one of the following Advanced Practice Providers on your designated Care Team:   Tommye Standard, [place] Legrand Como "[facility]" [place], [place]

## 2021-01-10 NOTE — Progress Notes (Signed)
PCP:  [name], DO Primary Cardiologist: Mertie Moores, MD Electrophysiologist: Thompson Grayer, MD   Anna Marquez is a 71 y.o. female seen today for Thompson Grayer, MD for routine electrophysiology followup.  Since last being seen in our clinic the patient reports doing well overall. Brief non-sustained palpitations that are not bothersome.  she denies chest pain, dyspnea, PND, orthopnea, nausea, vomiting, dizziness, syncope, edema, weight gain, or early satiety.  Past Medical History:  Diagnosis Date   Adenomatous colon polyp 2003   Cholelithiasis    COVID    Diverticulosis of colon    Gastric polyps    GERD (gastroesophageal reflux disease)    Helicobacter pylori (H. pylori)    Hiatal hernia    Hypertension    Mitral valve prolapse    Nephrolithiasis    Osteoarthritis    Osteopenia 03/2017   T score -1.5 FRAX 8.9% / 0.9%.  Stable from prior DEXA 2016   Persistent atrial fibrillation (HCC)    TGA (transient global amnesia)    Past Surgical History:  Procedure Laterality Date   ANTERIOR AND POSTERIOR REPAIR N/A 06/14/2014   Procedure: ANTERIOR (CYSTOCELE) AND POSTERIOR REPAIR (RECTOCELE);  Surgeon: Anastasio Auerbach, MD;  Location: [facility];  Service: Gynecology;  Laterality: N/A;   ATRIAL FIBRILLATION ABLATION N/A 02/11/2019   Procedure: ATRIAL FIBRILLATION ABLATION;  Surgeon: Thompson Grayer, MD;  Location: [facility];  Service: Cardiovascular;  Laterality: N/A;   CARDIOVERSION N/A 01/15/2019   Procedure: CARDIOVERSION;  Surgeon: Fay Records, MD;  Location: [facility];  Service: Cardiovascular;  Laterality: N/A;   CHOLECYSTECTOMY     kidney stone removal     x2   VAGINAL HYSTERECTOMY N/A 06/14/2014   Procedure: HYSTERECTOMY VAGINAL;  Surgeon: Anastasio Auerbach, MD;  Location: [facility];  Service: Gynecology;  Laterality: N/A;    Current Outpatient Medications  Medication Sig Dispense Refill   acetaminophen (TYLENOL) 500 MG tablet Take 1,000 mg by mouth every 6 (six)  hours as needed for moderate pain or headache.     Ascorbic Acid (VITAMIN C) 1000 MG tablet Take 1,000 mg by mouth daily.     Cholecalciferol (VITAMIN D) 50 MCG (2000 UT) tablet Take 2,000 Units by mouth daily.     diclofenac Sodium (VOLTAREN) 1 % GEL Apply topically 4 (four) times daily.     diltiazem (CARDIZEM) 30 MG tablet TAKE 1 TABLET EVERY 4 HOURS AS NEEDED FOR HEART RATE GREATER THAN 100 45 tablet 1   ELIQUIS 5 MG TABS tablet TAKE 1 TABLET BY MOUTH 2 TIMES DAILY. 60 tablet 5   ferrous sulfate 325 (65 FE) MG tablet Take 325 mg by mouth every other day.     fluticasone (FLONASE) 50 MCG/ACT nasal spray PLACE 1 SPRAY INTO BOTH NOSTRILS AS NEEDED FOR ALLERGIES OR RHINITIS. 16 g 6   furosemide (LASIX) 20 MG tablet TAKE 1 TABLET (20 MG TOTAL) BY MOUTH DAILY AS NEEDED FOR EDEMA. 30 tablet 1   HYDROcodone bit-homatropine (HYCODAN) 5-1.5 MG/5ML syrup Take 5 mLs by mouth every 6 (six) hours as needed for cough. 120 mL 0   lisinopril (ZESTRIL) 20 MG tablet TAKE 1 TABLET (20 MG TOTAL) BY MOUTH DAILY. 90 tablet 3   loratadine (CLARITIN) 10 MG tablet Take 10 mg by mouth daily.      nadolol (CORGARD) 40 MG tablet Take 1 tablet (40 mg total) by mouth 2 (two) times daily. 180 tablet 3   pantoprazole (PROTONIX) 40 MG tablet TAKE 1  TABLET (40 MG TOTAL) BY MOUTH DAILY. 90 tablet 4   potassium chloride (KLOR-CON) 10 MEQ tablet TAKE 1 TABLET BY MOUTH DAILY AS NEEDED (TAKE WITH HCTZ). 30 tablet 6   PROAIR HFA 108 (90 Base) MCG/ACT inhaler INHALE 1 TO 2 PUFFS INTO THE LUNGS EVERY 6 HOURS AS NEEDED FOR WHEEZING OR SHORTNESS OF BREATH. 8.5 g 3   Probiotic Product (PROBIOTIC PO) Take 1 tablet by mouth daily.     vitamin E 180 MG (400 UNITS) capsule Take 400 Units by mouth daily.     ZINC CITRATE PO Take 22 mg by mouth in the morning and at bedtime.     No current facility-administered medications for this visit.    Allergies  Allergen Reactions   Erythromycin     Upset stomach   Nsaids Other (See Comments)     Has a history of bleeding ulcers, tolerates aspirin    Penicillins Hives and Shortness Of Breath    Did it involve swelling of the face/tongue/throat, SOB, or low BP? Yes Did it involve sudden or severe rash/hives, skin peeling, or any reaction on the inside of your mouth or nose? No Did you need to seek medical attention at a hospital or doctor's office? No When did it last happen?      45 years If all above answers are NO, may proceed with cephalosporin use.     Social History   Socioeconomic History   Marital status: Divorced    Spouse name: Not on file   Number of children: 2   Years of education: 12th grade   Highest education level: High school graduate  Occupational History   Occupation: Retired,  Tobacco Use   Smoking status: Never   Smokeless tobacco: Never  Vaping Use   Vaping Use: Never used  Substance and Sexual Activity   Alcohol use: Never    Alcohol/week: 0.0 standard drinks   Drug use: No   Sexual activity: Yes    Birth control/protection: Post-menopausal    Comment: 1st intercourse 71 yo-Fewer than 5 partners  Other Topics Concern   Not on file  Social History Narrative   Lives along with her dog. She enjoys croteching, crafts, making wreaths, reading and gardening in her free time.    Social Determinants of Health   Financial Resource Strain: Low Risk    Difficulty of Paying Living Expenses: Not hard at all  Food Insecurity: No Food Insecurity   Worried About Charity fundraiser in the Last Year: Never true   [place] in the Last Year: Never true  Transportation Needs: No Transportation Needs   Lack of Transportation (Medical): No   Lack of Transportation (Non-Medical): No  Physical Activity: Sufficiently Active   Days of Exercise per Week: 7 days   Minutes of Exercise per Session: 60 min  Stress: No Stress Concern Present   Feeling of Stress : Not at all  Social Connections: Moderately Isolated   Frequency of Communication with Friends  and Family: More than three times a week   Frequency of Social Gatherings with Friends and Family: More than three times a week   Attends Religious Services: More than 4 times per year   Active Member of [employer] or Organizations: No   Attends Archivist Meetings: Never   Marital Status: Divorced  Human resources officer Violence: Not At Risk   Fear of Current or Ex-Partner: No   Emotionally Abused: No   Physically Abused: No  Sexually Abused: No     Review of Systems: All other systems reviewed and are otherwise negative except as noted above.  Physical Exam: Vitals:   01/10/21 1049  BP: (!) 152/100  Pulse: 74  SpO2: 96%  Weight: 171 lb (77.6 kg)  Height: 5\' 4"  (1.626 m)    GEN- The patient is well appearing, alert and oriented x 3 today.   HEENT: normocephalic, atraumatic; sclera clear, conjunctiva pink; hearing intact; oropharynx clear; neck supple, no JVP Lymph- no cervical lymphadenopathy Lungs- Clear to ausculation bilaterally, normal work of breathing.  No wheezes, rales, rhonchi Heart- Regular rate and rhythm, no murmurs, rubs or gallops, PMI not laterally displaced GI- soft, non-tender, non-distended, bowel sounds present, no hepatosplenomegaly Extremities- no clubbing, cyanosis, or edema; DP/PT/radial pulses 2+ bilaterally MS- no significant deformity or atrophy Skin- warm and dry, no rash or lesion Psych- euthymic mood, full affect Neuro- strength and sensation are intact  EKG is ordered. Personal review of EKG from today shows NSR at 76 bpm  Additional studies reviewed include: Previous EP office notes.   Assessment and Plan:  1. Persistent atrial fibrillation EKG today shows NSR  Off tikosyn On Eliquis for CHADS2VASC of at least 3 Continue nadaolol 40 mg BID Labs today.  2. HTN Elevated today.  Move lisinopril to bedtime. Consider up-titration if not better regulated.   3. NICM EF normalized s/p ablation.  4. MVP Mild/Mod MR with mild  prolapse Echo 08/2019 Follow up echo ordered today.   Follow up with EP APP in 6 months   Shirley Friar, [place]  01/10/21 11:14 AM

## 2021-01-11 DIAGNOSIS — N200 Calculus of kidney: Secondary | ICD-10-CM | POA: Diagnosis not present

## 2021-01-11 DIAGNOSIS — N1330 Unspecified hydronephrosis: Secondary | ICD-10-CM | POA: Diagnosis not present

## 2021-01-11 LAB — CBC
Hematocrit: 41 % (ref 34.0–46.6)
Hemoglobin: 14.1 g/dL (ref 11.1–15.9)
MCH: 29.6 pg (ref 26.6–33.0)
MCHC: 34.4 g/dL (ref 31.5–35.7)
MCV: 86 fL (ref 79–97)
Platelets: 221 10*3/uL (ref 150–450)
RBC: 4.76 x10E6/uL (ref 3.77–5.28)
RDW: 12.3 % (ref 11.7–15.4)
WBC: 6.3 10*3/uL (ref 3.4–10.8)

## 2021-01-11 LAB — BASIC METABOLIC PANEL
BUN/Creatinine Ratio: 14 (ref 12–28)
BUN: 13 mg/dL (ref 8–27)
CO2: 24 mmol/L (ref 20–29)
Calcium: 9.9 mg/dL (ref 8.7–10.3)
Chloride: 100 mmol/L (ref 96–106)
Creatinine, Ser: 0.93 mg/dL (ref 0.57–1.00)
Glucose: 81 mg/dL (ref 70–99)
Potassium: 4.1 mmol/L (ref 3.5–5.2)
Sodium: 143 mmol/L (ref 134–144)
eGFR: 66 mL/min/{1.73_m2} (ref >59)

## 2021-01-23 ENCOUNTER — Telehealth: Payer: Self-pay | Admitting: *Deleted

## 2021-01-23 NOTE — Chronic Care Management (AMB) (Signed)
°  Care Management   Note  01/23/2021 Name: Anna Marquez MRN: [id number] DOB: 1950/04/22  Anna Marquez is a 71 y.o. year old female who is a primary care patient of [name], DO and is actively engaged with the care management team. I reached out to [name] by phone today to assist with re-scheduling a follow up visit with the Pharmacist  Follow up plan: Unsuccessful telephone outreach attempt made. A HIPAA compliant phone message was left for the patient providing contact information and requesting a return call.   Julian Hy, [place] Management  Direct Dial: [phone]

## 2021-01-25 ENCOUNTER — Ambulatory Visit (HOSPITAL_COMMUNITY): Payer: PPO | Attending: Student

## 2021-01-25 ENCOUNTER — Telehealth: Payer: PPO

## 2021-01-25 ENCOUNTER — Other Ambulatory Visit: Payer: Self-pay

## 2021-01-25 DIAGNOSIS — I340 Nonrheumatic mitral (valve) insufficiency: Secondary | ICD-10-CM | POA: Diagnosis not present

## 2021-01-25 DIAGNOSIS — I341 Nonrheumatic mitral (valve) prolapse: Secondary | ICD-10-CM | POA: Insufficient documentation

## 2021-01-25 LAB — ECHOCARDIOGRAM COMPLETE
Area-P 1/2: 3.58 cm2
MV M vel: 5.51 m/s
MV Peak grad: 121.4 mmHg
P 1/2 time: 719 msec
S' Lateral: 2.6 cm

## 2021-02-14 ENCOUNTER — Other Ambulatory Visit: Payer: Self-pay | Admitting: Cardiovascular Disease

## 2021-02-14 ENCOUNTER — Other Ambulatory Visit (HOSPITAL_COMMUNITY): Payer: Self-pay | Admitting: Nurse Practitioner

## 2021-02-14 ENCOUNTER — Other Ambulatory Visit: Payer: Self-pay | Admitting: Family Medicine

## 2021-02-26 ENCOUNTER — Telehealth: Payer: Self-pay | Admitting: Student

## 2021-02-26 DIAGNOSIS — I10 Essential (primary) hypertension: Secondary | ICD-10-CM

## 2021-02-26 DIAGNOSIS — Z79899 Other long term (current) drug therapy: Secondary | ICD-10-CM

## 2021-02-26 MED ORDER — LISINOPRIL 40 MG PO TABS: 40.0000 mg | ORAL_TABLET | Freq: Every day | ORAL | 3 refills | Status: DC

## 2021-02-26 NOTE — Telephone Encounter (Signed)
Pt c/o BP issue: STAT if pt c/o blurred vision, one-sided weakness or slurred speech  1. What are your last 5 BP readings?  02/18 182/117 7:00 AM  02/19 157/83 3:30 PM 171/108 5:30 PM 02/20 131/96 9:30 AM  2. Are you having any other symptoms (ex. Dizziness, headache, blurred vision, passed out)? Headache  3. What is your BP issue?  Hypertension over the weekend. Was advised to report hypertension if it occurred at her previous appt with [place].

## 2021-02-26 NOTE — Chronic Care Management (AMB) (Signed)
°  Care Management   Note  02/26/2021 Name: Anna Marquez MRN: [id number] DOB: 11-03-1950  Anna Marquez is a 71 y.o. year old female who is a primary care patient of [name], DO and is actively engaged with the care management team. I reached out to [name] by phone today to assist with re-scheduling a follow up visit with the Pharmacist  Follow up plan: Telephone appointment with care management team member scheduled for: 03/02/2021  Julian Hy, [place], [place] Management  Direct Dial: [phone]

## 2021-02-26 NOTE — Telephone Encounter (Signed)
Anna Friar, PA-C  Please increase lisinopril to 40 mg daily with BMET in 1 week and referral to HTN clinic if BP not improved by that time           Sent in new prescription, ordered blood work and scheduled. Advised patient to bring BP readings to appt on 3/1 with Anna Kilts PA verbalized understanding.

## 2021-02-27 NOTE — Telephone Encounter (Signed)
Called and spoke to patient regarding duplicate dose of Lisinopril 40mg . PT states this morning at 8:30, her Bp reading was 117/82, HR 76. Pt did take her Nadolol also this morning. Asked pt to check her BP while on the phone. Reading per pt was 093/235 HR 77. Diastolic number noted to be high, advised pt to continue to check Bp throughout the day today. Pt denies symptoms of hypotension including dizziness, weakness, increased tiredness or lethargy and condones relief from a persistant headache she attributes to elevated BP. Pt states that the confusion stemmed from attempting to take medication at night, and will just leave at morning doses per usual so that she doesn't repeat this mistake. Advised pt to call back if needed.

## 2021-03-02 ENCOUNTER — Other Ambulatory Visit: Payer: Self-pay

## 2021-03-02 ENCOUNTER — Ambulatory Visit (INDEPENDENT_AMBULATORY_CARE_PROVIDER_SITE_OTHER): Payer: PPO | Admitting: Pharmacist

## 2021-03-02 DIAGNOSIS — I4891 Unspecified atrial fibrillation: Secondary | ICD-10-CM

## 2021-03-02 DIAGNOSIS — I10 Essential (primary) hypertension: Secondary | ICD-10-CM

## 2021-03-02 NOTE — Patient Instructions (Signed)
Visit Information  Thank you for taking time to visit with me today. Please don't hesitate to contact me if I can be of assistance to you before our next scheduled telephone appointment.  Following are the goals we discussed today:  Patient Goals/Self-Care Activities Over the next 180 days, patient will:  take medications as prescribed  Follow Up Plan: Telephone follow up appointment with care management team member scheduled for:  6 months   Please call the care guide team at [phone] if you need to cancel or reschedule your appointment.    Patient verbalizes understanding of instructions and care plan provided today and agrees to view in [place]. Active MyChart status confirmed with patient.    Darius Bump

## 2021-03-02 NOTE — Progress Notes (Signed)
Chronic Care Management Pharmacy Note  03/02/2021 Name:  KATIANNA MCCLENNEY MRN:  [id number] DOB:  16-Apr-1950  Summary: addressed HTN, Afib (s/p ablation 02/10/19). Had a recent mishap of taking recently increase dose of lisinopril 39m at night and then back to back in the morning. However, has since resolved, no significant issues from this.   Vitals monitored at home as follows: 127/105 HR 113 118/82 HR 75    Answered questions about OTC vitamins.    Recommendations/Changes made from today's visit: none, pt doing well and following with cardiology.  Answered questions about migraines, assessed for risk factors, recommend ongoing discussion with PCP for possible prescription for PRN migraine.  Plan: f/u with pharmacist in 6 months   Subjective: BLYNNDA WIERSMAis an 71y.o. year old female who is a primary patient of MLuetta Nutting DO.  The CCM team was consulted for assistance with disease management and care coordination needs.    Engaged with patient by telephone for follow up visit in response to provider referral for pharmacy case management and/or care coordination services.   Consent to Services:  The patient was given information about Chronic Care Management services, agreed to services, and gave verbal consent prior to initiation of services.  Please see initial visit note for detailed documentation.   Patient Care Team: MLuetta Nutting DO as PCP - General (Family Medicine) Nahser, PWonda Cheng MD as PCP - Cardiology (Cardiology) AThompson Grayer MD as PCP - Electrophysiology (Cardiology) Nahser, PWonda Cheng MD (Cardiology) KDarius Bump RSt. Elizabeth Grantas Pharmacist (Pharmacist)  Recent office visits:  CLuetta NuttingDO(PCP)- Patient seen for Sinobronchitis. Started on doxycycline and add course of prednisone 424mBID x5 days. Started hycodan as needed for cough. Follow up as needed.   02/07/20 CoLuetta NuttingO(PCP)- Patient seen for Paroxysmal Atrial fibrillation.Follow up in 6 months  for Hypertension.   Recent consult visits:  07/12/20 JaTiana LoftD(Cardiology)- Patient seen for Atrial Fibrillation. Reduced Nadolol to 40 MG BID. Consider repeat echo on follow-up with Dr NaAcie FredricksonFollow up in 1 year.  Objective:  Lab Results  Component Value Date   CREATININE 0.93 01/10/2021   CREATININE 0.89 08/07/2020   CREATININE 0.96 07/26/2019    Lab Results  Component Value Date   HGBA1C 5.1 08/21/2015   Last diabetic Eye exam: No results found for: HMDIABEYEEXA  Last diabetic Foot exam: No results found for: HMDIABFOOTEX      Component Value Date/Time   CHOL 179 08/07/2020 0000   TRIG 396 (H) 08/07/2020 0000   HDL 46 (L) 08/07/2020 0000   CHOLHDL 3.9 08/07/2020 0000   VLDL 51.8 (H) 11/04/2018 1128   LDLCALC 80 08/07/2020 0000   LDLDIRECT 100.0 11/04/2018 1128    Hepatic Function Latest Ref Rng & Units 08/07/2020 07/26/2019 11/11/2018  Total Protein 6.1 - 8.1 g/dL 6.8 7.8 6.7  Albumin 3.5 - 5.0 g/dL - 4.5 4.2  AST 10 - 35 U/L _0 ALT 6 - 29 U/L _1 Alk Phosphatase 38 - 126 U/L - 52 43  Total Bilirubin 0.2 - 1.2 mg/dL 1.9(H) 2.1(H) 1.6(H)  Bilirubin, Direct 0.0 - 0.2 mg/dL - 0.2 0.2    Lab Results  Component Value Date/Time   TSH 0.38 (L) 08/07/2020 12:00 AM   TSH 0.20 (L) 11/04/2018 11:28 AM   FREET4 1.17 11/11/2018 01:38 PM   FREET4 1.16 03/18/2018 04:15 AM    CBC Latest Ref Rng & Units 01/10/2021 08/07/2020 07/26/2019  WBC 3.4 -  10.8 x10E3/uL 6.3 5.3 8.4  Hemoglobin 11.1 - 15.9 g/dL 14.1 12.7 13.0  Hematocrit 34.0 - 46.6 % 41.0 38.5 37.2  Platelets 150 - 450 x10E3/uL 221 170 206     Social History   Tobacco Use  Smoking Status Never  Smokeless Tobacco Never   BP Readings from Last 3 Encounters:  01/10/21 (!) 152/100  12/06/20 119/79  10/09/20 (!) 150/84   Pulse Readings from Last 3 Encounters:  01/10/21 74  12/06/20 66  10/09/20 78   Wt Readings from Last 3 Encounters:  01/10/21 171 lb (77.6 kg)  12/06/20 169 lb (76.7 kg)   10/09/20 173 lb 6.4 oz (78.7 kg)    Assessment: Review of patient past medical history, allergies, medications, health status, including review of consultants reports, laboratory and other test data, was performed as part of comprehensive evaluation and provision of chronic care management services.   SDOH:  (Social Determinants of Health) assessments and interventions performed:    CCM Care Plan  Allergies  Allergen Reactions   Erythromycin     Upset stomach   Nsaids Other (See Comments)    Has a history of bleeding ulcers, tolerates aspirin    Penicillins Hives and Shortness Of Breath    Did it involve swelling of the face/tongue/throat, SOB, or low BP? Yes Did it involve sudden or severe rash/hives, skin peeling, or any reaction on the inside of your mouth or nose? No Did you need to seek medical attention at a hospital or doctor's office? No When did it last happen?      45 years If all above answers are NO, may proceed with cephalosporin use.     Medications Reviewed Today     Reviewed by Darius Bump, [facility] (Pharmacist) on 03/02/21 at 1013  Med List Status: <None>   Medication Order Taking? Sig Documenting Provider Last Dose Status Informant  acetaminophen (TYLENOL) 500 MG tablet [id number] Yes Take 1,000 mg by mouth every 6 (six) hours as needed for moderate pain or headache. [provider] Taking Active Self  Ascorbic Acid (VITAMIN C) 1000 MG tablet [id number] Yes Take 1,000 mg by mouth daily. [provider] Taking Active Self  Cholecalciferol (VITAMIN D) 50 MCG (2000 UT) tablet [id number] Yes Take 2,000 Units by mouth daily. [provider] Taking Active Self  diclofenac Sodium (VOLTAREN) 1 % GEL [id number] Yes Apply topically 4 (four) times daily. [provider] Taking Active Self  diltiazem (CARDIZEM) 30 MG tablet [id number] Yes TAKE 1 TABLET EVERY 4 HOURS AS NEEDED FOR HEART RATE GREATER THAN 100 Sherran Needs, NP Taking Active    ELIQUIS 5 MG TABS tablet [id number] Yes TAKE 1 TABLET BY MOUTH 2 TIMES DAILY. Nahser, Wonda Cheng, MD Taking Active   ferrous sulfate 325 (65 FE) MG tablet [id number] Yes Take 325 mg by mouth every other day. [provider] Taking Active Self  fluticasone (FLONASE) 50 MCG/ACT nasal spray [id number] Yes PLACE 1 SPRAY INTO BOTH NOSTRILS AS NEEDED FOR ALLERGIES OR RHINITIS. [name], DO Taking Active   furosemide (LASIX) 20 MG tablet [id number] Yes TAKE 1 TABLET (20 MG TOTAL) BY MOUTH DAILY AS NEEDED FOR EDEMA. Shirley Friar, PA-C Taking Active   HYDROcodone bit-homatropine (HYCODAN) 5-1.5 MG/5ML syrup [id number] No Take 5 mLs by mouth every 6 (six) hours as needed for cough.  Patient not taking: Reported on 03/02/2021   Lavada Mesi Not Taking Active   lisinopril (ZESTRIL) 40 MG tablet [id number] Yes Take 1  tablet (40 mg total) by mouth daily. Shirley Friar, PA-C Taking Active   loratadine (CLARITIN) 10 MG tablet [id number] Yes Take 10 mg by mouth daily.  [provider] Taking Active Self  nadolol (CORGARD) 40 MG tablet [id number] Yes Take 1 tablet (40 mg total) by mouth 2 (two) times daily. Thompson Grayer, MD Taking Active   pantoprazole (PROTONIX) 40 MG tablet [id number] Yes TAKE 1 TABLET (40 MG TOTAL) BY MOUTH DAILY. [name], DO Taking Active   potassium chloride (KLOR-CON) 10 MEQ tablet [id number] Yes TAKE 1 TABLET BY MOUTH DAILY AS NEEDED (TAKE WITH HCTZ). Nahser, Wonda Cheng, MD Taking Active   PROAIR HFA 108 [phone] Base) MCG/ACT inhaler [id number] Yes INHALE 1 TO 2 PUFFS INTO THE LUNGS EVERY 6 HOURS AS NEEDED FOR WHEEZING OR SHORTNESS OF BREATH. [name], DO Taking Active   Probiotic Product (PROBIOTIC PO) [id number] Yes Take 1 tablet by mouth daily. [provider] Taking Active Self  vitamin E 180 MG (400 UNITS) capsule [id number] Yes Take 400 Units by mouth daily. [provider] Taking Active Self  ZINC CITRATE PO [id number] Yes  Take 22 mg by mouth in the morning and at bedtime. [provider] Taking Active             Patient Active Problem List   Diagnosis Date Noted   Sinobronchitis 02/01/2019   Seasonal allergic rhinitis due to pollen 09/25/2018   Reactive airway disease 09/25/2018   Hypomagnesemia 03/25/2018   Atrial fibrillation with RVR ([place]) 03/16/2018   Anemia 02/04/2018   Recurrent nephrolithiasis 02/04/2018   Screening for lipid disorders 10/28/2017   Cough 11/21/2016   Hypokalemia 03/07/2016   TIA (transient ischemic attack) 03/07/2016   Essential hypertension 01/27/2012   A-fib ([place]) 01/09/2012   Migraine without aura 05/20/2008   Allergic rhinitis 05/20/2008   Mitral regurgitation 08/10/2007   GERD 08/10/2007   Diaphragmatic hernia 08/10/2007   DIVERTICULOSIS, COLON 08/10/2007   Osteoarthritis 08/10/2007   COLONIC POLYPS, ADENOMATOUS, HX OF 08/10/2007    Immunization History  Administered Date(s) Administered   Fluad Quad(high Dose 65+) 11/04/2018, 11/10/2019   Influenza Whole 10/07/2009   Influenza, High Dose Seasonal PF 09/12/2015, 10/25/2016, 10/28/2017   Influenza, Seasonal, Injecte, Preservative Fre 01/09/2012   Influenza-Unspecified 10/24/2016, 10/19/2020   PFIZER(Purple Top)SARS-COV-2 Vaccination 03/04/2019, 03/24/2019   Pneumococcal Conjugate-13 05/02/2014   Pneumococcal Polysaccharide-23 09/12/2015   Td 01/08/2007, 10/28/2017   Zoster, Live 09/12/2015    Conditions to be addressed/monitored: Atrial Fibrillation and HTN  Care Plan : Medication Management  Updates made by Darius Bump, [place] since 03/02/2021 12:00 AM     Problem: HTN, Afib      Long-Range Goal: Disease Progression Prevention   Start Date: 07/20/2020  Recent Progress: On track  Priority: High  Note:   Current Barriers:  None at present  Pharmacist Clinical Goal(s):  Over the next 180 days, patient will maintain control of chronic conditions as evidenced by medication fill history,  lab values, vital signs  through collaboration with PharmD and provider.   Interventions: 1:1 collaboration with [name], DO regarding development and update of comprehensive plan of care as evidenced by provider attestation and co-signature Inter-disciplinary care team collaboration (see longitudinal plan of care) Comprehensive medication review performed; medication list updated in electronic medical record  Hypertension:  Controlled; current treatment:lisinopril 98m daily, nadalol 437mBID, furosemide PRN, diltiazem PRN ;   Current home readings: 110s-130s/70-90s   Denies hypotensive/hypertensive symptoms  Recommended continue current regimen  and  Atrial Fibrillation:  Controlled; current rate/rhythm control: nadalol 9m BID, diltiazem 327mPRN HR >100; anticoagulant treatment: eliquis 52m24mID  Recommended continue current regimen   Patient Goals/Self-Care Activities Over the next 180 days, patient will:  take medications as prescribed  Follow Up Plan: Telephone follow up appointment with care management team member scheduled for:  6 months       Medication Assistance: None required.  Patient affirms current coverage meets needs.  Patient's preferred pharmacy is:  [facility] [id number]: [phone]: [phone] pill box? No - uses storage bin and working well for her Pt endorses 100% compliance  Follow Up:  Patient agrees to Care Plan and Follow-up.  Plan: Telephone follow up appointment with care management team member scheduled for:  6 months  KeeLarinda ButteryharmD Clinical Pharmacist [id number] [phone]

## 2021-03-05 ENCOUNTER — Other Ambulatory Visit: Payer: Self-pay

## 2021-03-05 ENCOUNTER — Other Ambulatory Visit: Payer: Self-pay | Admitting: Family Medicine

## 2021-03-05 ENCOUNTER — Other Ambulatory Visit: Payer: PPO | Admitting: *Deleted

## 2021-03-05 DIAGNOSIS — Z79899 Other long term (current) drug therapy: Secondary | ICD-10-CM | POA: Diagnosis not present

## 2021-03-05 DIAGNOSIS — I10 Essential (primary) hypertension: Secondary | ICD-10-CM | POA: Diagnosis not present

## 2021-03-06 DIAGNOSIS — I10 Essential (primary) hypertension: Secondary | ICD-10-CM

## 2021-03-06 DIAGNOSIS — I4891 Unspecified atrial fibrillation: Secondary | ICD-10-CM

## 2021-03-06 LAB — BASIC METABOLIC PANEL
BUN/Creatinine Ratio: 14 (ref 12–28)
BUN: 11 mg/dL (ref 8–27)
CO2: 22 mmol/L (ref 20–29)
Calcium: 9.3 mg/dL (ref 8.7–10.3)
Chloride: 107 mmol/L — ABNORMAL HIGH (ref 96–106)
Creatinine, Ser: 0.8 mg/dL (ref 0.57–1.00)
Glucose: 96 mg/dL (ref 70–99)
Potassium: 3.7 mmol/L (ref 3.5–5.2)
Sodium: 143 mmol/L (ref 134–144)
eGFR: 78 mL/min/{1.73_m2} (ref >59)

## 2021-03-06 NOTE — Progress Notes (Signed)
? ?PCP:  [name], DO ?Primary Cardiologist: Mertie Moores, MD ?Electrophysiologist: Thompson Grayer, MD  ? ?Anna Marquez is a 71 y.o. female seen today for Thompson Grayer, MD for routine electrophysiology followup.  Since last being seen in our clinic the patient reports doing well overall. BP remains labile 110s - 170s at home with no clear. Has occasional, brief non-sustained palpitations that are not bothersome.  she denies chest pain, dyspnea, PND, orthopnea, nausea, vomiting, dizziness, syncope, edema, weight gain, or early satiety. ? ?Past Medical History:  ?Diagnosis Date  ? Adenomatous colon polyp 2003  ? Cholelithiasis   ? COVID   ? Diverticulosis of colon   ? Gastric polyps   ? GERD (gastroesophageal reflux disease)   ? Helicobacter pylori (H. pylori)   ? Hiatal hernia   ? Hypertension   ? Mitral valve prolapse   ? Nephrolithiasis   ? Osteoarthritis   ? Osteopenia 03/2017  ? T score -1.5 FRAX 8.9% / 0.9%.  Stable from prior DEXA 2016  ? Persistent atrial fibrillation ([place])   ? TGA (transient global amnesia)   ? ?Past Surgical History:  ?Procedure Laterality Date  ? ANTERIOR AND POSTERIOR REPAIR N/A 06/14/2014  ? Procedure: ANTERIOR (CYSTOCELE) AND POSTERIOR REPAIR (RECTOCELE);  Surgeon: Anastasio Auerbach, MD;  Location: [facility];  Service: Gynecology;  Laterality: N/A;  ? ATRIAL FIBRILLATION ABLATION N/A 02/11/2019  ? Procedure: ATRIAL FIBRILLATION ABLATION;  Surgeon: Thompson Grayer, MD;  Location: [place] [facility];  Service: Cardiovascular;  Laterality: N/A;  ? CARDIOVERSION N/A 01/15/2019  ? Procedure: CARDIOVERSION;  Surgeon: Fay Records, MD;  Location: [facility];  Service: Cardiovascular;  Laterality: N/A;  ? CHOLECYSTECTOMY    ? kidney stone removal    ? x2  ? VAGINAL HYSTERECTOMY N/A 06/14/2014  ? Procedure: HYSTERECTOMY VAGINAL;  Surgeon: Anastasio Auerbach, MD;  Location: [facility];  Service: Gynecology;  Laterality: N/A;  ? ? ?Current Outpatient Medications  ?Medication Sig Dispense Refill  ?  acetaminophen (TYLENOL) 500 MG tablet Take 1,000 mg by mouth every 6 (six) hours as needed for moderate pain or headache.    ? Ascorbic Acid (VITAMIN C) 1000 MG tablet Take 1,000 mg by mouth daily.    ? Cholecalciferol (VITAMIN D) 50 MCG (2000 UT) tablet Take 2,000 Units by mouth daily.    ? diclofenac Sodium (VOLTAREN) 1 % GEL Apply topically 4 (four) times daily.    ? diltiazem (CARDIZEM) 30 MG tablet TAKE 1 TABLET EVERY 4 HOURS AS NEEDED FOR HEART RATE GREATER THAN 100 45 tablet 1  ? ELIQUIS 5 MG TABS tablet TAKE 1 TABLET BY MOUTH 2 TIMES DAILY. 60 tablet 5  ? ferrous sulfate 325 (65 FE) MG tablet Take 325 mg by mouth every other day.    ? fluticasone (FLONASE) 50 MCG/ACT nasal spray PLACE 1 SPRAY INTO BOTH NOSTRILS AS NEEDED FOR ALLERGIES OR RHINITIS. 16 g 6  ? furosemide (LASIX) 20 MG tablet TAKE 1 TABLET (20 MG TOTAL) BY MOUTH DAILY AS NEEDED FOR EDEMA. 90 tablet 3  ? HYDROcodone bit-homatropine (HYCODAN) 5-1.5 MG/5ML syrup Take 5 mLs by mouth every 6 (six) hours as needed for cough. 120 mL 0  ? lisinopril (ZESTRIL) 40 MG tablet Take 1 tablet (40 mg total) by mouth daily. 90 tablet 3  ? loratadine (CLARITIN) 10 MG tablet Take 10 mg by mouth daily.     ? nadolol (CORGARD) 40 MG tablet Take 1 tablet (40 mg total) by mouth 2 (two) times  daily. 180 tablet 3  ? pantoprazole (PROTONIX) 40 MG tablet TAKE 1 TABLET (40 MG TOTAL) BY MOUTH DAILY. 90 tablet 4  ? potassium chloride (KLOR-CON) 10 MEQ tablet TAKE 1 TABLET BY MOUTH DAILY AS NEEDED (TAKE WITH HCTZ). 90 tablet 3  ? PROAIR HFA 108 (90 Base) MCG/ACT inhaler INHALE 1 TO 2 PUFFS INTO THE LUNGS EVERY 6 HOURS AS NEEDED FOR WHEEZING OR SHORTNESS OF BREATH. 8.5 g 3  ? Probiotic Product (PROBIOTIC PO) Take 1 tablet by mouth daily.    ? vitamin E 180 MG (400 UNITS) capsule Take 400 Units by mouth daily.    ? ZINC CITRATE PO Take 22 mg by mouth in the morning and at bedtime.    ? ?No current facility-administered medications for this visit.  ? ? ?Allergies  ?Allergen  Reactions  ? Erythromycin   ?  Upset stomach  ? Nsaids Other (See Comments)  ?  Has a history of bleeding ulcers, tolerates aspirin   ? Penicillins Hives and Shortness Of Breath  ?  Did it involve swelling of the face/tongue/throat, SOB, or low BP? Yes ?Did it involve sudden or severe rash/hives, skin peeling, or any reaction on the inside of your mouth or nose? No ?Did you need to seek medical attention at a hospital or doctor's office? No ?When did it last happen?      45 years ?If all above answers are ?NO?, may proceed with cephalosporin use. ?  ? ? ?Social History  ? ?Socioeconomic History  ? Marital status: Divorced  ?  Spouse name: Not on file  ? Number of children: 2  ? Years of education: 12th grade  ? Highest education level: High school graduate  ?Occupational History  ? Occupation: Retired,  ?Tobacco Use  ? Smoking status: Never  ? Smokeless tobacco: Never  ?Vaping Use  ? Vaping Use: Never used  ?Substance and Sexual Activity  ? Alcohol use: Never  ?  Alcohol/week: 0.0 standard drinks  ? Drug use: No  ? Sexual activity: Yes  ?  Birth control/protection: Post-menopausal  ?  Comment: 1st intercourse 71 yo-Fewer than 5 partners  ?Other Topics Concern  ? Not on file  ?Social History Narrative  ? Lives along with her dog. She enjoys croteching, crafts, making wreaths, reading and gardening in her free time.   ? ?Social Determinants of Health  ? ?Financial Resource Strain: Low Risk   ? Difficulty of Paying Living Expenses: Not hard at all  ?Food Insecurity: No Food Insecurity  ? Worried About Charity fundraiser in the Last Year: Never true  ? Ran Out of Food in the Last Year: Never true  ?Transportation Needs: No Transportation Needs  ? Lack of Transportation (Medical): No  ? Lack of Transportation (Non-Medical): No  ?Physical Activity: Sufficiently Active  ? Days of Exercise per Week: 7 days  ? Minutes of Exercise per Session: 60 min  ?Stress: No Stress Concern Present  ? Feeling of Stress : Not at all   ?Social Connections: Moderately Isolated  ? Frequency of Communication with Friends and Family: More than three times a week  ? Frequency of Social Gatherings with Friends and Family: More than three times a week  ? Attends Religious Services: More than 4 times per year  ? Active Member of Clubs or Organizations: No  ? Attends Archivist Meetings: Never  ? Marital Status: Divorced  ?Intimate Partner Violence: Not At Risk  ? Fear of Current or Ex-Partner:  No  ? Emotionally Abused: No  ? Physically Abused: No  ? Sexually Abused: No  ? ? ? ?Review of Systems: ?All other systems reviewed and are otherwise negative except as noted above. ? ?Physical Exam: ?Vitals:  ? 03/07/21 0942  ?BP: (!) 160/100  ?Pulse: 83  ?SpO2: 97%  ?Weight: 173 lb (78.5 kg)  ?Height: 5\' 4"  (1.626 m)  ? ? ?EKG is not ordered.  ? ?Additional studies reviewed include: ?Previous EP office notes.  ? ?Assessment and Plan: ? ?1. Persistent atrial fibrillation ?Maintaining NSR s/p ablation off tikosyn.  ?On Eliquis for CHADS2VASC of at least 3 ?Continue nadaolol 40 mg BID ? ?2. HTN ?Remains labile ?Start amlodipine 5 mg qhs ? ?3. NICM ?Echo 01/2021 LVEF 60-65% ? ?4. MVP ?Mild/Mod MR again noted echo 01/25/2021 ? ?Refer to HTN clinic. Follow up with EP APP in 6 months  ? ?Shirley Friar, PA-C  ?03/07/21 ?9:49 AM  ?

## 2021-03-07 ENCOUNTER — Other Ambulatory Visit: Payer: Self-pay

## 2021-03-07 ENCOUNTER — Ambulatory Visit: Payer: PPO | Admitting: Student

## 2021-03-07 ENCOUNTER — Encounter: Payer: Self-pay | Admitting: Student

## 2021-03-07 VITALS — BP 160/100 | HR 83 | Ht 64.0 in | Wt 173.0 lb

## 2021-03-07 DIAGNOSIS — Z79899 Other long term (current) drug therapy: Secondary | ICD-10-CM

## 2021-03-07 DIAGNOSIS — I341 Nonrheumatic mitral (valve) prolapse: Secondary | ICD-10-CM | POA: Diagnosis not present

## 2021-03-07 DIAGNOSIS — I10 Essential (primary) hypertension: Secondary | ICD-10-CM

## 2021-03-07 DIAGNOSIS — I4819 Other persistent atrial fibrillation: Secondary | ICD-10-CM

## 2021-03-07 MED ORDER — AMLODIPINE BESYLATE 5 MG PO TABS: 5.0000 mg | ORAL_TABLET | Freq: Every day | ORAL | 3 refills | Status: DC

## 2021-03-07 NOTE — Patient Instructions (Signed)
Medication Instructions:  ?Your physician has recommended you make the following change in your medication:  ? ?START: Amlodipine 5mg  daily at bedtime ? ?*If you need a refill on your cardiac medications before your next appointment, please call your pharmacy* ? ? ?Lab Work: ?None ?If you have labs (blood work) drawn today and your tests are completely normal, you will receive your results only by: ?MyChart Message (if you have MyChart) OR ?A paper copy in the mail ?If you have any lab test that is abnormal or we need to change your treatment, we will call you to review the results. ? ? ?Follow-Up: ?At [facility], you and your health needs are our priority.  As part of our continuing mission to provide you with exceptional heart care, we have created designated Provider Care Teams.  These Care Teams include your primary Cardiologist (physician) and Advanced Practice Providers (APPs -  Physician Assistants and Nurse Practitioners) who all work together to provide you with the care you need, when you need it. ? ?We recommend signing up for the patient portal called "MyChart".  Sign up information is provided on this After Visit Summary.  MyChart is used to connect with patients for Virtual Visits (Telemedicine).  Patients are able to view lab/test results, encounter notes, upcoming appointments, etc.  Non-urgent messages can be sent to your provider as well.   ?To learn more about what you can do with MyChart, go to [email or web address].   ? ?Your next appointment:   ?6 month(s) ? ?The format for your next appointment:   ?In Person ? ?Provider:   ?You may see Thompson Grayer, MD or one of the following Advanced Practice Providers on your designated Care Team:   ?Tommye Standard, PA-C ?Legrand Como "Jonni Sanger" [place], PA-C  ? ? ?Other Instructions ?You have been referred to our Hypertension Clinic. They will call you to schedule.   ?

## 2021-03-26 ENCOUNTER — Ambulatory Visit (INDEPENDENT_AMBULATORY_CARE_PROVIDER_SITE_OTHER): Payer: PPO | Admitting: Family Medicine

## 2021-03-26 VITALS — BP 111/79 | HR 78 | Temp 99.0°F | Wt 165.0 lb

## 2021-03-26 DIAGNOSIS — Z0000 Encounter for general adult medical examination without abnormal findings: Secondary | ICD-10-CM

## 2021-03-26 NOTE — Progress Notes (Signed)
? ? ?MEDICARE ANNUAL WELLNESS VISIT ? ?03/26/2021 ? ?Telephone Visit Disclaimer ?This Medicare AWV was conducted by telephone due to national recommendations for restrictions regarding the COVID-19 Pandemic (e.g. social distancing).  I verified, using two identifiers, that I am speaking with Anna Marquez or their authorized healthcare agent. I discussed the limitations, risks, security, and privacy concerns of performing an evaluation and management service by telephone and the potential availability of an in-person appointment in the future. The patient expressed understanding and agreed to proceed.  ?Location of Patient: Home ?Location of Provider (nurse):  In the office. ? ?Subjective:  ? ? ?Anna Marquez is a 71 y.o. female patient of Anna Nutting, DO who had a Medicare Annual Wellness Visit today via telephone. Anna Marquez is Retired and lives alone. she has 2 children. she reports that she is socially active and does interact with friends/family regularly. she is moderately physically active and enjoys croteching, crafts, making wreaths, reading and gardening in her free time.  ? ?Patient Care Team: ?Anna Nutting, DO as PCP - General (Family Medicine) ?Nahser, Wonda Cheng, MD as PCP - Cardiology (Cardiology) ?Thompson Grayer, MD as PCP - Electrophysiology (Cardiology) ?Nahser, Wonda Cheng, MD (Cardiology) ?Darius Bump, [facility] as Pharmacist (Pharmacist) ? ?Advanced Directives 03/26/2021 03/15/2020 02/07/2020 07/26/2019 02/11/2019 01/15/2019 12/22/2018  ?Does Patient Have a Medical Advance Directive? Yes Yes Yes Yes Yes Yes Yes  ?Type of Advance Directive Living will Living will Living will Living will Healthcare Power of [place];Living will [place];Living will  ?Does patient want to make changes to medical advance directive? No - Patient declined No - Patient declined - No - Patient declined No - Patient declined - No - Patient declined  ?Copy of [place]  in Chart? - - - - No - copy requested No - copy requested No - copy requested  ?Would patient like information on creating a medical advance directive? - - - - - No - Guardian declined -  ? ? ?Hospital Utilization Over the Past 12 Months: ?# of hospitalizations or ER visits: 0 ?# of surgeries: 0 ? ?Review of Systems    ?Patient reports that her overall health is better compared to last year. ? ?History obtained from chart review and the patient ? ?Patient Reported Readings (BP, Pulse, CBG, Weight, etc) ?Temp: 99 ?SPO2: 98 ?BP: 111/79 ?Pulse: 78 ?Weight: 165 lbs ? ?Pain Assessment ?Pain : No/denies pain ? ?  ? ?Current Medications & Allergies (verified) ?Allergies as of 03/26/2021   ? ?   Reactions  ? Erythromycin   ? Upset stomach  ? Nsaids Other (See Comments)  ? Has a history of bleeding ulcers, tolerates aspirin   ? Penicillins Hives, Shortness Of Breath  ? Did it involve swelling of the face/tongue/throat, SOB, or low BP? Yes ?Did it involve sudden or severe rash/hives, skin peeling, or any reaction on the inside of your mouth or nose? No ?Did you need to seek medical attention at a hospital or doctor's office? No ?When did it last happen?      45 years ?If all above answers are ?NO?, may proceed with cephalosporin use.  ? ?  ? ?  ?Medication List  ?  ? ?  ? Accurate as of March 26, 2021  2:49 PM. If you have any questions, ask your nurse or doctor.  ?  ?  ? ?  ? ?acetaminophen 500 MG tablet ?Commonly known as: TYLENOL ?Take 1,000 mg  by mouth every 6 (six) hours as needed for moderate pain or headache. ?  ?albuterol 108 (90 Base) MCG/ACT inhaler ?Commonly known as: VENTOLIN HFA ?INHALE 1 TO 2 PUFFS INTO THE LUNGS EVERY 6 HOURS AS NEEDED FOR WHEEZING OR SHORTNESS OF BREATH. ?  ?amLODipine 5 MG tablet ?Commonly known as: NORVASC ?Take 1 tablet (5 mg total) by mouth at bedtime. ?  ?Biotin 10000 MCG Tabs ?Take by mouth. Twice a day. ?  ?diclofenac Sodium 1 % Gel ?Commonly known as: VOLTAREN ?Apply topically 4 (four)  times daily. ?  ?diltiazem 30 MG tablet ?Commonly known as: CARDIZEM ?TAKE 1 TABLET EVERY 4 HOURS AS NEEDED FOR HEART RATE GREATER THAN 100 ?  ?Eliquis 5 MG Tabs tablet ?Generic drug: apixaban ?TAKE 1 TABLET BY MOUTH 2 TIMES DAILY. ?  ?ferrous sulfate 325 (65 FE) MG tablet ?Take 325 mg by mouth every other day. ?  ?fluticasone 50 MCG/ACT nasal spray ?Commonly known as: FLONASE ?PLACE 1 SPRAY INTO BOTH NOSTRILS AS NEEDED FOR ALLERGIES OR RHINITIS. ?  ?furosemide 20 MG tablet ?Commonly known as: LASIX ?TAKE 1 TABLET (20 MG TOTAL) BY MOUTH DAILY AS NEEDED FOR EDEMA. ?  ?HYDROcodone bit-homatropine 5-1.5 MG/5ML syrup ?Commonly known as: HYCODAN ?Take 5 mLs by mouth every 6 (six) hours as needed for cough. ?  ?lisinopril 40 MG tablet ?Commonly known as: ZESTRIL ?Take 1 tablet (40 mg total) by mouth daily. ?  ?loratadine 10 MG tablet ?Commonly known as: CLARITIN ?Take 10 mg by mouth daily. ?  ?nadolol 40 MG tablet ?Commonly known as: Corgard ?Take 1 tablet (40 mg total) by mouth 2 (two) times daily. ?  ?pantoprazole 40 MG tablet ?Commonly known as: PROTONIX ?TAKE 1 TABLET (40 MG TOTAL) BY MOUTH DAILY. ?  ?potassium chloride 10 MEQ tablet ?Commonly known as: KLOR-CON ?TAKE 1 TABLET BY MOUTH DAILY AS NEEDED (TAKE WITH HCTZ). ?  ?PROBIOTIC PO ?Take 1 tablet by mouth daily. ?  ?vitamin C 1000 MG tablet ?Take 1,000 mg by mouth daily. ?  ?Vitamin D 50 MCG (2000 UT) tablet ?Take 2,000 Units by mouth daily. ?  ?vitamin E 180 MG (400 UNITS) capsule ?Take 400 Units by mouth daily. ?  ?ZINC CITRATE PO ?Take 22 mg by mouth in the morning and at bedtime. ?  ? ?  ? ? ?History (reviewed): ?Past Medical History:  ?Diagnosis Date  ? Adenomatous colon polyp 2003  ? Cholelithiasis   ? COVID   ? Diverticulosis of colon   ? Gastric polyps   ? GERD (gastroesophageal reflux disease)   ? Helicobacter pylori (H. pylori)   ? Hiatal hernia   ? Hypertension   ? Mitral valve prolapse   ? Nephrolithiasis   ? Osteoarthritis   ? Osteopenia 03/2017  ? T  score -1.5 FRAX 8.9% / 0.9%.  Stable from prior DEXA 2016  ? Persistent atrial fibrillation ([place])   ? TGA (transient global amnesia)   ? ?Past Surgical History:  ?Procedure Laterality Date  ? ANTERIOR AND POSTERIOR REPAIR N/A 06/14/2014  ? Procedure: ANTERIOR (CYSTOCELE) AND POSTERIOR REPAIR (RECTOCELE);  Surgeon: Anastasio Auerbach, MD;  Location: [facility];  Service: Gynecology;  Laterality: N/A;  ? ATRIAL FIBRILLATION ABLATION N/A 02/11/2019  ? Procedure: ATRIAL FIBRILLATION ABLATION;  Surgeon: Thompson Grayer, MD;  Location: [place] [facility];  Service: Cardiovascular;  Laterality: N/A;  ? CARDIOVERSION N/A 01/15/2019  ? Procedure: CARDIOVERSION;  Surgeon: Fay Records, MD;  Location: [place];  Service: Cardiovascular;  Laterality: N/A;  ?  CHOLECYSTECTOMY    ? kidney stone removal    ? x2  ? VAGINAL HYSTERECTOMY N/A 06/14/2014  ? Procedure: HYSTERECTOMY VAGINAL;  Surgeon: Anastasio Auerbach, MD;  Location: [facility] [facility];  Service: Gynecology;  Laterality: N/A;  ? ?Family History  ?Problem Relation Age of Onset  ? Cancer Father   ?     prostate  ? Breast cancer Paternal Aunt 14  ? Diabetes Maternal Grandmother   ? Heart disease Maternal Grandmother   ? Heart disease Maternal Grandfather   ? Melanoma Paternal Aunt   ? Neuropathy Mother   ? Congestive Heart Failure Mother   ? Thyroid disease Daughter   ? ?Social History  ? ?Socioeconomic History  ? Marital status: Divorced  ?  Spouse name: Not on file  ? Number of children: 2  ? Years of education: 12th grade  ? Highest education level: High school graduate  ?Occupational History  ? Occupation: Retired,  ?Tobacco Use  ? Smoking status: Never  ? Smokeless tobacco: Never  ?Vaping Use  ? Vaping Use: Never used  ?Substance and Sexual Activity  ? Alcohol use: Never  ?  Alcohol/week: 0.0 standard drinks  ? Drug use: No  ? Sexual activity: Yes  ?  Birth control/protection: Post-menopausal  ?  Comment: 1st intercourse 71 yo-Fewer than 5 partners  ?Other Topics Concern  ? Not on file   ?Social History Narrative  ? Lives along with her dog. She enjoys croteching, crafts, making wreaths, reading and gardening in her free time.   ? ?Social Determinants of Health  ? ?Emergency planning/management officer

## 2021-03-26 NOTE — Patient Instructions (Addendum)
?MEDICARE ANNUAL WELLNESS VISIT ?Health Maintenance Summary and Written Plan of Care ? ?Ms. Riki Rusk , ? ?Thank you for allowing me to perform your Medicare Annual Wellness Visit and for your ongoing commitment to your health.  ? ?Health Maintenance & Immunization History ?Health Maintenance  ?Topic Date Due  ?? COVID-19 Vaccine (3 - Pfizer risk series) 04/11/2021 (Originally 04/21/2019)  ?? Zoster Vaccines- Shingrix (1 of 2) 06/26/2021 (Originally 02/02/1969)  ?? MAMMOGRAM  07/27/2022  ?? COLONOSCOPY (Pts 45-95yr Insurance coverage will need to be confirmed)  03/13/2023  ?? TETANUS/TDAP  10/29/2027  ?? Pneumonia Vaccine 72 Years old  Completed  ?? INFLUENZA VACCINE  Completed  ?? DEXA SCAN  Completed  ?? Hepatitis C Screening  Completed  ?? HPV VACCINES  Aged Out  ? ?Immunization History  ?Administered Date(s) Administered  ?? Fluad Quad(high Dose 65+) 11/04/2018, 11/10/2019  ?? Influenza Whole 10/07/2009  ?? Influenza, High Dose Seasonal PF 09/12/2015, 10/25/2016, 10/28/2017  ?? Influenza, Seasonal, Injecte, Preservative Fre 01/09/2012  ?? Influenza-Unspecified 10/24/2016, 10/19/2020  ?? PFIZER(Purple Top)SARS-COV-2 Vaccination 03/04/2019, 03/24/2019  ?? Pneumococcal Conjugate-13 05/02/2014  ?? Pneumococcal Polysaccharide-23 09/12/2015  ?? Td 01/08/2007, 10/28/2017  ?? Zoster, Live 09/12/2015  ? ? ?These are the patient goals that we discussed: ? Goals Addressed   ?  ?  ?  ?  ?  ? This Visit's Progress  ? ?  Patient Stated (pt-stated)     ?   Patient would like to exercise more and would like to 20lbs. ?  ?  ?  ? ?This is a list of Health Maintenance Items that are overdue or due now: ?Shingrix vaccine ? ?Orders/Referrals Placed Today: ?No orders of the defined types were placed in this encounter. ? ?(Contact our referral department at [phone] you have not spoken with someone about your referral appointment within the next 5 days)  ? ? ?Follow-up Plan ?Follow-up with MLuetta Nutting DO as planned ?Schedule  your shingrix vaccine at your pharmacy. ?Medicare wellness visit in one year. ?Patient will access AVS on my chart. ? ? ? ?  ?Health Maintenance, Female ?Adopting a healthy lifestyle and getting preventive care are important in promoting health and wellness. Ask your health care provider about: ?The right schedule for you to have regular tests and exams. ?Things you can do on your own to prevent diseases and keep yourself healthy. ?What should I know about diet, weight, and exercise? ?Eat a healthy diet ? ?Eat a diet that includes plenty of vegetables, fruits, low-fat dairy products, and lean protein. ?Do not eat a lot of foods that are high in solid fats, added sugars, or sodium. ?Maintain a healthy weight ?Body mass index (BMI) is used to identify weight problems. It estimates body fat based on height and weight. Your health care provider can help determine your BMI and help you achieve or maintain a healthy weight. ?Get regular exercise ?Get regular exercise. This is one of the most important things you can do for your health. Most adults should: ?Exercise for at least 150 minutes each week. The exercise should increase your heart rate and make you sweat (moderate-intensity exercise). ?Do strengthening exercises at least twice a week. This is in addition to the moderate-intensity exercise. ?Spend less time sitting. Even light physical activity can be beneficial. ?Watch cholesterol and blood lipids ?Have your blood tested for lipids and cholesterol at 72years of age, then have this test every 5 years. ?Have your cholesterol levels checked more often if: ?Your lipid or  cholesterol levels are high. ?You are older than 72 years of age. ?You are at high risk for heart disease. ?What should I know about cancer screening? ?Depending on your health history and family history, you may need to have cancer screening at various ages. This may include screening for: ?Breast cancer. ?Cervical cancer. ?Colorectal cancer. ?Skin  cancer. ?Lung cancer. ?What should I know about heart disease, diabetes, and high blood pressure? ?Blood pressure and heart disease ?High blood pressure causes heart disease and increases the risk of stroke. This is more likely to develop in people who have high blood pressure readings or are overweight. ?Have your blood pressure checked: ?Every 3-5 years if you are 72-72 years of age. ?Every year if you are 72 years old or older. ?Diabetes ?Have regular diabetes screenings. This checks your fasting blood sugar level. Have the screening done: ?Once every three years after age 72 if you are at a normal weight and have a low risk for diabetes. ?More often and at a younger age if you are overweight or have a high risk for diabetes. ?What should I know about preventing infection? ?Hepatitis B ?If you have a higher risk for hepatitis B, you should be screened for this virus. Talk with your health care provider to find out if you are at risk for hepatitis B infection. ?Hepatitis C ?Testing is recommended for: ?Everyone born from [birth year] through 1966. ?Anyone with known risk factors for hepatitis C. ?Sexually transmitted infections (STIs) ?Get screened for STIs, including gonorrhea and chlamydia, if: ?You are sexually active and are younger than 72 years of age. ?You are older than 72 years of age and your health care provider tells you that you are at risk for this type of infection. ?Your sexual activity has changed since you were last screened, and you are at increased risk for chlamydia or gonorrhea. Ask your health care provider if you are at risk. ?Ask your health care provider about whether you are at high risk for HIV. Your health care provider may recommend a prescription medicine to help prevent HIV infection. If you choose to take medicine to prevent HIV, you should first get tested for HIV. You should then be tested every 3 months for as long as you are taking the medicine. ?Pregnancy ?If you are about to stop  having your period (premenopausal) and you may become pregnant, seek counseling before you get pregnant. ?Take 400 to 800 micrograms (mcg) of folic acid every day if you become pregnant. ?Ask for birth control (contraception) if you want to prevent pregnancy. ?Osteoporosis and menopause ?Osteoporosis is a disease in which the bones lose minerals and strength with aging. This can result in bone fractures. If you are 54 years old or older, or if you are at risk for osteoporosis and fractures, ask your health care provider if you should: ?Be screened for bone loss. ?Take a calcium or vitamin D supplement to lower your risk of fractures. ?Be given hormone replacement therapy (HRT) to treat symptoms of menopause. ?Follow these instructions at home: ?Alcohol use ?Do not drink alcohol if: ?Your health care provider tells you not to drink. ?You are pregnant, may be pregnant, or are planning to become pregnant. ?If you drink alcohol: ?Limit how much you have to: ?0-1 drink a day. ?Know how much alcohol is in your drink. In the [place] one drink equals one 12 oz bottle of beer (355 mL), one 5 oz glass of wine (148 mL), or one 1? oz glass  of hard liquor (44 mL). ?Lifestyle ?Do not use any products that contain nicotine or tobacco. These products include cigarettes, chewing tobacco, and vaping devices, such as e-cigarettes. If you need help quitting, ask your health care provider. ?Do not use street drugs. ?Do not share needles. ?Ask your health care provider for help if you need support or information about quitting drugs. ?General instructions ?Schedule regular health, dental, and eye exams. ?Stay current with your vaccines. ?Tell your health care provider if: ?You often feel depressed. ?You have ever been abused or do not feel safe at home. ?Summary ?Adopting a healthy lifestyle and getting preventive care are important in promoting health and wellness. ?Follow your health care provider's instructions about healthy diet,  exercising, and getting tested or screened for diseases. ?Follow your health care provider's instructions on monitoring your cholesterol and blood pressure. ?This information is not intended to replace advice

## 2021-04-19 ENCOUNTER — Other Ambulatory Visit: Payer: Self-pay

## 2021-04-19 DIAGNOSIS — I10 Essential (primary) hypertension: Secondary | ICD-10-CM

## 2021-05-09 ENCOUNTER — Ambulatory Visit (INDEPENDENT_AMBULATORY_CARE_PROVIDER_SITE_OTHER): Payer: PPO | Admitting: Physician Assistant

## 2021-05-09 ENCOUNTER — Encounter: Payer: Self-pay | Admitting: Physician Assistant

## 2021-05-09 VITALS — BP 168/91 | HR 74 | Temp 97.6°F | Ht 64.0 in | Wt 175.0 lb

## 2021-05-09 DIAGNOSIS — H6123 Impacted cerumen, bilateral: Secondary | ICD-10-CM | POA: Insufficient documentation

## 2021-05-09 DIAGNOSIS — J440 Chronic obstructive pulmonary disease with acute lower respiratory infection: Secondary | ICD-10-CM

## 2021-05-09 DIAGNOSIS — J40 Bronchitis, not specified as acute or chronic: Secondary | ICD-10-CM

## 2021-05-09 DIAGNOSIS — J209 Acute bronchitis, unspecified: Secondary | ICD-10-CM

## 2021-05-09 DIAGNOSIS — J4521 Mild intermittent asthma with (acute) exacerbation: Secondary | ICD-10-CM | POA: Diagnosis not present

## 2021-05-09 DIAGNOSIS — J329 Chronic sinusitis, unspecified: Secondary | ICD-10-CM

## 2021-05-09 MED ORDER — HYDROCODONE BIT-HOMATROP MBR 5-1.5 MG/5ML PO SOLN
5.0000 mL | Freq: Three times a day (TID) | ORAL | 0 refills | Status: DC | PRN
Start: 2021-05-09 — End: 2021-06-19

## 2021-05-09 MED ORDER — DEXAMETHASONE 4 MG PO TABS: 4.0000 mg | ORAL_TABLET | Freq: Two times a day (BID) | ORAL | 0 refills | Status: DC

## 2021-05-09 MED ORDER — AZITHROMYCIN 250 MG PO TABS: ORAL_TABLET | ORAL | 0 refills | Status: DC

## 2021-05-09 MED ORDER — ALBUTEROL SULFATE (2.5 MG/3ML) 0.083% IN NEBU: 2.5000 mg | INHALATION_SOLUTION | RESPIRATORY_TRACT | 0 refills | Status: DC | PRN

## 2021-05-09 NOTE — Patient Instructions (Addendum)
?[employer] ?4.8 ?(91) ? Medical supply store ?[place] ?Open ? Closes 5:30?PM ? [phone] ? ?[facility] ?3.9 ?(70) ? Medical supply store ?[address] ?Open ? Closes 6?PM ? [phone] ? ?[facility] ?3.5 ?(25) ? Medical supply store ?[address] ?Open ? Closes 5?PM ? [phone] ? ?Acute Bronchitis, Adult ? ?Acute bronchitis is sudden inflammation of the main airways (bronchi) that come off the windpipe (trachea) in the lungs. The swelling causes the airways to get smaller and make more mucus than normal. This can make it hard to breathe and can cause coughing or noisy breathing (wheezing). ?Acute bronchitis may last several weeks. The cough may last longer. Allergies, asthma, and exposure to smoke may make the condition worse. ?What are the causes? ?This condition can be caused by germs and by substances that irritate the lungs, including: ?Cold and flu viruses. The most common cause of this condition is the virus that causes the common cold. ?Bacteria. This is less common. ?Breathing in substances that irritate the lungs, including: ?Smoke from cigarettes and other forms of tobacco. ?Dust and pollen. ?Fumes from household cleaning products, gases, or burned fuel. ?Indoor or outdoor air pollution. ?What increases the risk? ?The following factors may make you more likely to develop this condition: ?A weak body's defense system, also called the immune system. ?A condition that affects your lungs and breathing, such as asthma. ?What are the signs or symptoms? ?Common symptoms of this condition include: ?Coughing. This may bring up clear, yellow, or green mucus from your lungs (sputum). ?Wheezing. ?Runny or stuffy nose. ?Having too much mucus in your lungs (chest congestion). ?Shortness of breath. ?Aches and pains, including sore throat or chest. ?How is this diagnosed? ?This condition is usually diagnosed based  on: ?Your symptoms and medical history. ?A physical exam. ?You may also have other tests, including tests to rule out other conditions, such as pneumonia. These tests include: ?A test of lung function. ?Test of a mucus sample to look for the presence of bacteria. ?Tests to check the oxygen level in your blood. ?Blood tests. ?Chest X-ray. ?How is this treated? ?Most cases of acute bronchitis clear up over time without treatment. Your health care provider may recommend: ?Drinking more fluids to help thin your mucus so it is easier to cough up. ?Taking inhaled medicine (inhaler) to improve air flow in and out of your lungs. ?Using a vaporizer or a humidifier. These are machines that add water to the air to help you breathe better. ?Taking a medicine that thins mucus and clears congestion (expectorant). ?Taking a medicine that prevents or stops coughing (cough suppressant). ?It is notcommon to take an antibiotic medicine for this condition. ?Follow these instructions at home: ? ?Take over-the-counter and prescription medicines only as told by your health care provider. ?Use an inhaler, vaporizer, or humidifier as told by your health care provider. ?Take two teaspoons (10 mL) of honey at bedtime to lessen coughing at night. ?Drink enough fluid to keep your urine pale yellow. ?Do not use any products that contain nicotine or tobacco. These products include cigarettes, chewing tobacco, and vaping devices, such as e-cigarettes. If you need help quitting, ask your health care provider. ?Get plenty of rest. ?Return to your normal activities as told by your health care provider. Ask your health care provider what activities are safe for you. ?Keep all follow-up visits. This is important. ?How is this prevented? ?To lower your  risk of getting this condition again: ?Wash your hands often with soap and water for at least 20 seconds. If soap and water are not available, use hand sanitizer. ?Avoid contact with people who have cold  symptoms. ?Try not to touch your mouth, nose, or eyes with your hands. ?Avoid breathing in smoke or chemical fumes. Breathing smoke or chemical fumes will make your condition worse. ?Get the flu shot every year. ?Contact a health care provider if: ?Your symptoms do not improve after 2 weeks. ?You have trouble coughing up the mucus. ?Your cough keeps you awake at night. ?You have a fever. ?Get help right away if you: ?Cough up blood. ?Feel pain in your chest. ?Have severe shortness of breath. ?Faint or keep feeling like you are going to faint. ?Have a severe headache. ?Have a fever or chills that get worse. ?These symptoms may represent a serious problem that is an emergency. Do not wait to see if the symptoms will go away. Get medical help right away. Call your local emergency services (911 in the [place] Do not drive yourself to the hospital. ?Summary ?Acute bronchitis is inflammation of the main airways (bronchi) that come off the windpipe (trachea) in the lungs. The swelling causes the airways to get smaller and make more mucus than normal. ?Drinking more fluids can help thin your mucus so it is easier to cough up. ?Take over-the-counter and prescription medicines only as told by your health care provider. ?Do not use any products that contain nicotine or tobacco. These products include cigarettes, chewing tobacco, and vaping devices, such as e-cigarettes. If you need help quitting, ask your health care provider. ?Contact a health care provider if your symptoms do not improve after 2 weeks. ?This information is not intended to replace advice given to you by your health care provider. Make sure you discuss any questions you have with your health care provider. ?Document Revised: 04/26/2020 Document Reviewed: 04/26/2020 ?Elsevier Patient Education ? [place]. ? ?

## 2021-05-09 NOTE — Progress Notes (Signed)
? ?Acute Office Visit ? ?Subjective:  ? ?  ?Patient ID: Anna Marquez, female    DOB: July 08, 1950, 71 y.o.   MRN: [id number] ? ?Chief Complaint  ?Patient presents with  ? Fever  ? Cough  ? ? ?HPI ?Patient is in today for 1 week of cough, wheezing, SOB, sinus pressure, fever, chills, body aches, headaches. Pt has hx of COPD, asthma, seasonal allergies. She is using her albuterol inhaler regularly. She continues to feel tighter and tighter in her lungs.  ? ?ROS ? ? ?   ?Objective:  ?  ?BP (!) 168/91   Pulse 74   Temp 97.6 ?F (36.4 ?C) (Oral)   Ht '5\' 4"'$  (1.626 m)   Wt 175 lb (79.4 kg)   SpO2 97%   BMI 30.04 kg/m?  ?BP Readings from Last 3 Encounters:  ?05/09/21 (!) 168/91  ?03/26/21 111/79  ?03/07/21 (!) 160/100  ? ?  ? ?Physical Exam ?Vitals reviewed.  ?Constitutional:   ?   Appearance: Normal appearance. She is obese.  ?HENT:  ?   Head: Normocephalic.  ?   Right Ear: There is impacted cerumen.  ?   Left Ear: There is impacted cerumen.  ?   Nose: Congestion present. No rhinorrhea.  ?   Mouth/Throat:  ?   Mouth: Mucous membranes are moist.  ?   Pharynx: Posterior oropharyngeal erythema present.  ?Eyes:  ?   Conjunctiva/sclera: Conjunctivae normal.  ?Neck:  ?   Vascular: No carotid bruit.  ?Cardiovascular:  ?   Rate and Rhythm: Normal rate.  ?   Pulses: Normal pulses.  ?   Heart sounds: Normal heart sounds.  ?Pulmonary:  ?   Effort: Pulmonary effort is normal.  ?   Breath sounds: Wheezing and rhonchi present.  ?Musculoskeletal:  ?   Cervical back: Normal range of motion. No rigidity or tenderness.  ?   Right lower leg: No edema.  ?   Left lower leg: No edema.  ?Lymphadenopathy:  ?   Cervical: No cervical adenopathy.  ?Neurological:  ?   General: No focal deficit present.  ?   Mental Status: She is alert.  ?Psychiatric:     ?   Mood and Affect: Mood normal.  ? ? ? ? ? ?   ?Assessment & Plan:  ?..Anna Marquez was seen today for fever and cough. ? ?Diagnoses and all orders for this visit: ? ?Acute bronchitis with COPD  ([place]) ?-     dexamethasone (DECADRON) 4 MG tablet; Take 1 tablet (4 mg total) by mouth 2 (two) times daily with a meal. ?-     azithromycin (ZITHROMAX) 250 MG tablet; 2 tabs po x1 on Day 1, then 1 tab po daily on Days 2 - 5 ?-     albuterol (PROVENTIL) (2.5 MG/3ML) 0.083% nebulizer solution; Take 3 mLs (2.5 mg total) by nebulization every 4 (four) hours as needed for wheezing or shortness of breath (please include nebulizer machine, hoses, and mask if needed.). ?-     HYDROcodone bit-homatropine (HYCODAN) 5-1.5 MG/5ML syrup; Take 5 mLs by mouth every 8 (eight) hours as needed for cough. ?-     For home use only DME Nebulizer machine ? ?Sinobronchitis ?-     dexamethasone (DECADRON) 4 MG tablet; Take 1 tablet (4 mg total) by mouth 2 (two) times daily with a meal. ?-     azithromycin (ZITHROMAX) 250 MG tablet; 2 tabs po x1 on Day 1, then 1 tab po daily on Days 2 -  5 ?-     albuterol (PROVENTIL) (2.5 MG/3ML) 0.083% nebulizer solution; Take 3 mLs (2.5 mg total) by nebulization every 4 (four) hours as needed for wheezing or shortness of breath (please include nebulizer machine, hoses, and mask if needed.). ?-     HYDROcodone bit-homatropine (HYCODAN) 5-1.5 MG/5ML syrup; Take 5 mLs by mouth every 8 (eight) hours as needed for cough. ?-     For home use only DME Nebulizer machine ? ?Bilateral impacted cerumen ? ?Mild intermittent reactive airway disease with acute exacerbation ?-     dexamethasone (DECADRON) 4 MG tablet; Take 1 tablet (4 mg total) by mouth 2 (two) times daily with a meal. ?-     albuterol (PROVENTIL) (2.5 MG/3ML) 0.083% nebulizer solution; Take 3 mLs (2.5 mg total) by nebulization every 4 (four) hours as needed for wheezing or shortness of breath (please include nebulizer machine, hoses, and mask if needed.). ?-     HYDROcodone bit-homatropine (HYCODAN) 5-1.5 MG/5ML syrup; Take 5 mLs by mouth every 8 (eight) hours as needed for cough. ?-     For home use only DME Nebulizer machine ? ? ?Treated with zpak  and dexamethasone.  ?Nebulizer ordered with solution to give her more efficiency.  ?Hycodan given for cough at night ?Rest and hydrate ?Follow up as needed or if symptoms worsen ? ?[name].Cerumen Removal Template: ?Indication: Cerumen impaction of the ear(s) ?Medical necessity statement: On physical examination, cerumen impairs clinically significant portions of the external auditory canal, and tympanic membrane. Noted obstructive, copious cerumen that cannot be removed without magnification and instrumentations requiring physician skills ?Consent: Discussed benefits and risks of procedure and verbal consent obtained ?Procedure: Patient was prepped for the procedure. Utilized an otoscope to assess and take note of the ear canal, the tympanic membrane, and the presence, amount, and placement of the cerumen. Gentle water irrigation and soft plastic curette was utilized to remove cerumen.  ?Post procedure examination shows cerumen was completely removed. Patient tolerated procedure well. The patient is made aware that they may experience temporary vertigo, temporary hearing loss, and temporary discomfort. If these symptom last for more than 24 hours to call the clinic or proceed to the ED. ? ? ?[name], PA-C ? ? ?

## 2021-05-15 ENCOUNTER — Ambulatory Visit (INDEPENDENT_AMBULATORY_CARE_PROVIDER_SITE_OTHER): Payer: PPO | Admitting: Pharmacist

## 2021-05-15 VITALS — BP 130/102 | HR 141

## 2021-05-15 DIAGNOSIS — I10 Essential (primary) hypertension: Secondary | ICD-10-CM | POA: Diagnosis not present

## 2021-05-15 NOTE — Patient Instructions (Addendum)
Please increase your intensity of exercise to moderate (5/10) if possible. We want your breathing and heart rate to increase. ?Continue monitoring blood pressure at home ?Please call us if blood pressure is consistently >140/90 ? ?Blood pressure goal if <130/80 ?Call me [phone]) or send me a mychart message ?

## 2021-05-15 NOTE — Progress Notes (Signed)
Patient ID: Anna Marquez                 DOB: 07/04/1950                      MRN: [id number] ? ? ? ? ?HPI: ?Anna Marquez is a 71 y.o. female patient of Dr. Elmarie Marquez referred by Anna Kilts, PA to HTN clinic. PMH is significant for afib, GERD, HTN, mitral valve prolapse. Patient with history of liable HTN. Was started on amlodipine '5mg'$  daily on 03/07/21. Patient reported on 4/13 that her blood pressure has been running low she had swelling and was feeling tired and dizzy. Amlodipine was stopped and she was asked to keep track of her BP. Patient was scheduled with Dr. Oval Marquez, but appointment is not until June. We were asked to see patient in the meantime. ? ?Patient presents today to clinic. She is recovering from asthmatic bronchitis. She just finished 5 days of dexamethasone '8mg'$ . She went into afib this AM. HR in clinic 144. She states that she rarely takes dilitazem prn because it makes her feel tired. Usually converts on her own.  ?She brings in a list of her blood pressures starting after she stopped amlodipine. Systolic was well controlled until she got sick and started steroids. Diastolic has been in the mid 80 ?'s mainly. Today in clinic BP is 130/100. She is very active and walks everyday at home and 3 times a week with a group. Also goes to the gym, but admits she doesn't get her HR up much when exercising.  ? ?Current HTN meds: lisinopril '40mg'$  daily, furosemide '20mg'$  daily as needed, nadolol '40mg'$  twice a day, diltiazem '30mg'$  as needed ?Previously tried: HCTZ (low K) ?BP goal: <130/80 ? ?Family History:  ?Family History  ?Problem Relation Age of Onset  ? Cancer Father   ?     prostate  ? Breast cancer Paternal Aunt 38  ? Diabetes Maternal Grandmother   ? Heart disease Maternal Grandmother   ? Heart disease Maternal Grandfather   ? Melanoma Paternal Aunt   ? Neuropathy Mother   ? Congestive Heart Failure Mother   ? Thyroid disease Daughter   ? ? ?Social History: no tobacco, no alcohol ? ?Diet:  ? ?Exercise:  arthritis group- walk 3 days a week with group, walks daily on her own, goes to gym- circuit room, treadmill  ? ?Home BP readings: starting after she stopped amlodipine 118/82, 112/85, 133/97, 132/87, 130/87, 144/85, 120/97, 136/86, 121/83, 120/88, 120/85, 128/88, 134/89, 133/80, 149/97, 134/81, 145/109 ? ?Wt Readings from Last 3 Encounters:  ?05/09/21 175 lb (79.4 kg)  ?03/26/21 165 lb (74.8 kg)  ?03/07/21 173 lb (78.5 kg)  ? ?BP Readings from Last 3 Encounters:  ?05/09/21 (!) 168/91  ?03/26/21 111/79  ?03/07/21 (!) 160/100  ? ?Pulse Readings from Last 3 Encounters:  ?05/09/21 74  ?03/26/21 78  ?03/07/21 83  ? ? ?Renal function: ?CrCl cannot be calculated (Patient's most recent lab result is older than the maximum 21 days allowed.). ? ?Past Medical History:  ?Diagnosis Date  ? Adenomatous colon polyp 2003  ? Cholelithiasis   ? COVID   ? Diverticulosis of colon   ? Gastric polyps   ? GERD (gastroesophageal reflux disease)   ? Helicobacter pylori (H. pylori)   ? Hiatal hernia   ? Hypertension   ? Mitral valve prolapse   ? Nephrolithiasis   ? Osteoarthritis   ? Osteopenia 03/2017  ? T score -  1.5 FRAX 8.9% / 0.9%.  Stable from prior DEXA 2016  ? Persistent atrial fibrillation ([place])   ? TGA (transient global amnesia)   ? ? ?Current Outpatient Medications on File Prior to Visit  ?Medication Sig Dispense Refill  ? acetaminophen (TYLENOL) 500 MG tablet Take 1,000 mg by mouth every 6 (six) hours as needed for moderate pain or headache.    ? albuterol (PROVENTIL) (2.5 MG/3ML) 0.083% nebulizer solution Take 3 mLs (2.5 mg total) by nebulization every 4 (four) hours as needed for wheezing or shortness of breath (please include nebulizer machine, hoses, and mask if needed.). 30 mL 0  ? albuterol (VENTOLIN HFA) 108 (90 Base) MCG/ACT inhaler INHALE 1 TO 2 PUFFS INTO THE LUNGS EVERY 6 HOURS AS NEEDED FOR WHEEZING OR SHORTNESS OF BREATH. 8.5 g 3  ? Ascorbic Acid (VITAMIN C) 1000 MG tablet Take 1,000 mg by mouth daily.    ?  azithromycin (ZITHROMAX) 250 MG tablet 2 tabs po x1 on Day 1, then 1 tab po daily on Days 2 - 5 6 tablet 0  ? Biotin 10000 MCG TABS Take by mouth. Twice a day.    ? Cholecalciferol (VITAMIN D) 50 MCG (2000 UT) tablet Take 2,000 Units by mouth daily.    ? dexamethasone (DECADRON) 4 MG tablet Take 1 tablet (4 mg total) by mouth 2 (two) times daily with a meal. 10 tablet 0  ? diclofenac Sodium (VOLTAREN) 1 % GEL Apply topically 4 (four) times daily.    ? diltiazem (CARDIZEM) 30 MG tablet TAKE 1 TABLET EVERY 4 HOURS AS NEEDED FOR HEART RATE GREATER THAN 100 45 tablet 1  ? ELIQUIS 5 MG TABS tablet TAKE 1 TABLET BY MOUTH 2 TIMES DAILY. 60 tablet 5  ? ferrous sulfate 325 (65 FE) MG tablet Take 325 mg by mouth every other day.    ? fluticasone (FLONASE) 50 MCG/ACT nasal spray PLACE 1 SPRAY INTO BOTH NOSTRILS AS NEEDED FOR ALLERGIES OR RHINITIS. 16 g 6  ? furosemide (LASIX) 20 MG tablet TAKE 1 TABLET (20 MG TOTAL) BY MOUTH DAILY AS NEEDED FOR EDEMA. 90 tablet 3  ? HYDROcodone bit-homatropine (HYCODAN) 5-1.5 MG/5ML syrup Take 5 mLs by mouth every 8 (eight) hours as needed for cough. 60 mL 0  ? lisinopril (ZESTRIL) 40 MG tablet Take 1 tablet (40 mg total) by mouth daily. 90 tablet 3  ? loratadine (CLARITIN) 10 MG tablet Take 10 mg by mouth daily.     ? nadolol (CORGARD) 40 MG tablet Take 1 tablet (40 mg total) by mouth 2 (two) times daily. 180 tablet 3  ? pantoprazole (PROTONIX) 40 MG tablet TAKE 1 TABLET (40 MG TOTAL) BY MOUTH DAILY. 90 tablet 4  ? potassium chloride (KLOR-CON) 10 MEQ tablet TAKE 1 TABLET BY MOUTH DAILY AS NEEDED (TAKE WITH HCTZ). 90 tablet 3  ? Probiotic Product (PROBIOTIC PO) Take 1 tablet by mouth daily.    ? vitamin E 180 MG (400 UNITS) capsule Take 400 Units by mouth daily.    ? ZINC CITRATE PO Take 22 mg by mouth in the morning and at bedtime.    ? ?No current facility-administered medications on file prior to visit.  ? ? ?Allergies  ?Allergen Reactions  ? Erythromycin   ?  Upset stomach  ? Nsaids Other  (See Comments)  ?  Has a history of bleeding ulcers, tolerates aspirin   ? Penicillins Hives and Shortness Of Breath  ?  Did it involve swelling of the face/tongue/throat, SOB,  or low BP? Yes ?Did it involve sudden or severe rash/hives, skin peeling, or any reaction on the inside of your mouth or nose? No ?Did you need to seek medical attention at a hospital or doctor's office? No ?When did it last happen?      45 years ?If all above answers are ?NO?, may proceed with cephalosporin use. ?  ? ? ?There were no vitals taken for this visit. ? ? ?Assessment/Plan: ? ?1. Hypertension - BP is elevated in clinic today above goal of <130/80. Currently I think we can keep a goal of <130/80. She does have a history of liable blood pressure and this can be re-assessed. Amlodipine '5mg'$  dropped her blood pressure too much and made her feel poorly (also caused swelling). We discussed trying a lower dose (2.'5mg'$  or even 1.'25mg'$  daily) vs spironolactone 12.'5mg'$  daily. Was previously on HCTZ but stopped due to low K. Patient does not want to make any changes at this time. She would like to wait and see what her BP does after she recovers from bronchitis and the steroids wear off. She will send me blood pressure readings in 2 weeks and we can reassess. She has appointment with Dr. Oval Marquez 6/13. I have encouraged her to increase the intensity of her exercise to moderate intensity (5/10) (increased HR and breathing).  ? ? ?Thank you ? ?Ramond Dial, Pharm.D, BCPS, CPP ?[id number] [place], [place] [address]  ?Phone: [phone]; Fax: [phone]  ? ? ?

## 2021-05-25 ENCOUNTER — Other Ambulatory Visit (HOSPITAL_COMMUNITY): Payer: Self-pay | Admitting: Nurse Practitioner

## 2021-05-26 ENCOUNTER — Emergency Department
Admission: RE | Admit: 2021-05-26 | Discharge: 2021-05-26 | Disposition: A | Discharge status: Home / Self Care | Payer: PPO | Source: Ambulatory Visit

## 2021-05-26 VITALS — BP 154/94 | HR 99 | Temp 98.6°F | Resp 18

## 2021-05-26 DIAGNOSIS — H109 Unspecified conjunctivitis: Secondary | ICD-10-CM | POA: Diagnosis not present

## 2021-05-26 MED ORDER — MOXIFLOXACIN HCL 0.5 % OP SOLN: 1.0000 [drp] | Freq: Three times a day (TID) | OPHTHALMIC | 0 refills | Status: AC

## 2021-05-26 NOTE — Discharge Instructions (Addendum)
Advised/instructed patient to instill eyedrops as directed.  Advised/encouraged patient if symptoms worsen and/or unresolved please follow-up with ophthalmology/optometry for further evaluation.

## 2021-05-26 NOTE — ED Provider Notes (Signed)
[facility]    CSN: [id number] Arrival date & time: 05/26/21  4098      History   Chief Complaint Chief Complaint  Patient presents with   Eye Problem    LT    HPI NICA FRISKE is a 71 y.o. female.   HPI Pleasant 71 year old female presents with left eye drainage and redness since Wednesday of this week.  Patient reports history of seasonal/allergic conjunctivitis.  Reports woke up this morning with eye crusted shut.  PMH significant for A-fib with RVR, TIA, and reactive airway disease.  Past Medical History:  Diagnosis Date   Adenomatous colon polyp 2003   Cholelithiasis    COVID    Diverticulosis of colon    Gastric polyps    GERD (gastroesophageal reflux disease)    Helicobacter pylori (H. pylori)    Hiatal hernia    Hypertension    Mitral valve prolapse    Nephrolithiasis    Osteoarthritis    Osteopenia 03/2017   T score -1.5 FRAX 8.9% / 0.9%.  Stable from prior DEXA 2016   Persistent atrial fibrillation ([facility])    TGA (transient global amnesia)     Patient Active Problem List   Diagnosis Date Noted   Bilateral impacted cerumen 05/09/2021   Sinobronchitis 02/01/2019   Seasonal allergic rhinitis due to pollen 09/25/2018   Reactive airway disease 09/25/2018   Hypomagnesemia 03/25/2018   Atrial fibrillation with RVR ([place]) 03/16/2018   Anemia 02/04/2018   Recurrent nephrolithiasis 02/04/2018   Screening for lipid disorders 10/28/2017   Cough 11/21/2016   Hypokalemia 03/07/2016   TIA (transient ischemic attack) 03/07/2016   Essential hypertension 01/27/2012   A-fib ([place]) 01/09/2012   Migraine without aura 05/20/2008   Allergic rhinitis 05/20/2008   Mitral regurgitation 08/10/2007   GERD 08/10/2007   Diaphragmatic hernia 08/10/2007   DIVERTICULOSIS, COLON 08/10/2007   Osteoarthritis 08/10/2007   COLONIC POLYPS, ADENOMATOUS, HX OF 08/10/2007    Past Surgical History:  Procedure Laterality Date   ANTERIOR AND POSTERIOR REPAIR N/A 06/14/2014    Procedure: ANTERIOR (CYSTOCELE) AND POSTERIOR REPAIR (RECTOCELE);  Surgeon: Anastasio Auerbach, MD;  Location: [facility];  Service: Gynecology;  Laterality: N/A;   ATRIAL FIBRILLATION ABLATION N/A 02/11/2019   Procedure: ATRIAL FIBRILLATION ABLATION;  Surgeon: Thompson Grayer, MD;  Location: [facility];  Service: Cardiovascular;  Laterality: N/A;   CARDIOVERSION N/A 01/15/2019   Procedure: CARDIOVERSION;  Surgeon: Fay Records, MD;  Location: [facility];  Service: Cardiovascular;  Laterality: N/A;   CHOLECYSTECTOMY     kidney stone removal     x2   VAGINAL HYSTERECTOMY N/A 06/14/2014   Procedure: HYSTERECTOMY VAGINAL;  Surgeon: Anastasio Auerbach, MD;  Location: [facility];  Service: Gynecology;  Laterality: N/A;    OB History     Gravida  2   Para  2   Term      Preterm      AB      Living  2      SAB      IAB      Ectopic      Multiple      Live Births               Home Medications    Prior to Admission medications   Medication Sig Start Date End Date Taking? Authorizing Provider  moxifloxacin (VIGAMOX) 0.5 % ophthalmic solution Place 1 drop into both eyes 3 (three) times daily for 7 days. 05/26/21 06/02/21  Yes Eliezer Lofts, FNP  acetaminophen (TYLENOL) 500 MG tablet Take 1,000 mg by mouth every 6 (six) hours as needed for moderate pain or headache.    [provider]  albuterol (PROVENTIL) (2.5 MG/3ML) 0.083% nebulizer solution Take 3 mLs (2.5 mg total) by nebulization every 4 (four) hours as needed for wheezing or shortness of breath (please include nebulizer machine, hoses, and mask if needed.). 05/09/21   Breeback, Jade L, PA-C  albuterol (VENTOLIN HFA) 108 (90 Base) MCG/ACT inhaler INHALE 1 TO 2 PUFFS INTO THE LUNGS EVERY 6 HOURS AS NEEDED FOR WHEEZING OR SHORTNESS OF BREATH. 03/08/21   [name], DO  Ascorbic Acid (VITAMIN C) 1000 MG tablet Take 1,000 mg by mouth daily.    [provider]  azithromycin (ZITHROMAX) 250 MG tablet 2 tabs po  x1 on Day 1, then 1 tab po daily on Days 2 - 5 05/09/21   Breeback, Jade L, PA-C  Biotin 10000 MCG TABS Take by mouth. Twice a day.    [provider]  Cholecalciferol (VITAMIN D) 50 MCG (2000 UT) tablet Take 2,000 Units by mouth daily.    [provider]  dexamethasone (DECADRON) 4 MG tablet Take 1 tablet (4 mg total) by mouth 2 (two) times daily with a meal. 05/09/21   Breeback, Jade L, PA-C  diclofenac Sodium (VOLTAREN) 1 % GEL Apply topically 4 (four) times daily.    [provider]  diltiazem (CARDIZEM) 30 MG tablet TAKE 1 TABLET EVERY 4 HOURS AS NEEDED FOR HEART RATE GREATER THAN 100 05/25/21   Sherran Needs, NP  ELIQUIS 5 MG TABS tablet TAKE 1 TABLET BY MOUTH 2 TIMES DAILY. 12/06/20   Nahser, Wonda Cheng, MD  ferrous sulfate 325 (65 FE) MG tablet Take 325 mg by mouth every other day.    [provider]  fluticasone (FLONASE) 50 MCG/ACT nasal spray PLACE 1 SPRAY INTO BOTH NOSTRILS AS NEEDED FOR ALLERGIES OR RHINITIS. 03/07/20   [name], DO  furosemide (LASIX) 20 MG tablet TAKE 1 TABLET (20 MG TOTAL) BY MOUTH DAILY AS NEEDED FOR EDEMA. 02/14/21   Shirley Friar, PA-C  HYDROcodone bit-homatropine (HYCODAN) 5-1.5 MG/5ML syrup Take 5 mLs by mouth every 8 (eight) hours as needed for cough. 05/09/21   Breeback, Jade L, PA-C  lisinopril (ZESTRIL) 40 MG tablet Take 1 tablet (40 mg total) by mouth daily. 02/26/21   Shirley Friar, PA-C  loratadine (CLARITIN) 10 MG tablet Take 10 mg by mouth daily.     [provider]  nadolol (CORGARD) 40 MG tablet Take 1 tablet (40 mg total) by mouth 2 (two) times daily. 07/12/20   Allred, Jeneen Rinks, MD  pantoprazole (PROTONIX) 40 MG tablet TAKE 1 TABLET (40 MG TOTAL) BY MOUTH DAILY. 02/14/21   [name], DO  potassium chloride (KLOR-CON) 10 MEQ tablet TAKE 1 TABLET BY MOUTH DAILY AS NEEDED (TAKE WITH HCTZ). 02/14/21   Nahser, Wonda Cheng, MD  Probiotic Product (PROBIOTIC PO) Take 1 tablet by mouth daily.    [provider]  vitamin E 180 MG (400 UNITS) capsule Take 400 Units by mouth daily.    [provider]  ZINC CITRATE PO Take 22 mg by mouth in the morning and at bedtime.    [provider]    Family History Family History  Problem Relation Age of Onset   Cancer Father        prostate   Breast cancer Paternal Aunt 26   Diabetes Maternal  Grandmother    Heart disease Maternal Grandmother    Heart disease Maternal Grandfather    Melanoma Paternal Aunt    Neuropathy Mother    Congestive Heart Failure Mother    Thyroid disease Daughter     Social History Social History   Tobacco Use   Smoking status: Never   Smokeless tobacco: Never  Vaping Use   Vaping Use: Never used  Substance Use Topics   Alcohol use: Never    Alcohol/week: 0.0 standard drinks   Drug use: No     Allergies   Erythromycin, Nsaids, and Penicillins   Review of Systems Review of Systems  Eyes:  Positive for discharge and redness.  All other systems reviewed and are negative.   Physical Exam Triage Vital Signs ED Triage Vitals [05/26/21 0855]  Enc Vitals Group     BP (!) 154/94     Pulse Rate 99     Resp 18     Temp 98.6 F (37 C)     Temp Source Oral     SpO2 97 %     Weight      Height      Head Circumference      Peak Flow      Pain Score 3     Pain Loc      Pain Edu?      Excl. in [place]?    No data found.  Updated Vital Signs BP (!) 154/94 (BP Location: Right Arm)   Pulse 99   Temp 98.6 F (37 C) (Oral)   Resp 18   SpO2 97%   Physical Exam Vitals and nursing note reviewed.  Constitutional:      General: She is not in acute distress.    Appearance: Normal appearance. She is normal weight. She is not ill-appearing.  HENT:     Head: Normocephalic and atraumatic.     Mouth/Throat:     Mouth: Mucous membranes are moist.     Pharynx: Oropharynx is clear.  Eyes:     Extraocular Movements: Extraocular movements intact.     Conjunctiva/sclera: Conjunctivae  normal.     Pupils: Pupils are equal, round, and reactive to light.     Comments: Eyes: sclera with +3 injection bilaterally trace crusted mucopurulent discharge at inner canthus/eyelashes bilaterally  Cardiovascular:     Rate and Rhythm: Normal rate and regular rhythm.     Pulses: Normal pulses.     Heart sounds: Normal heart sounds.  Pulmonary:     Effort: Pulmonary effort is normal.     Breath sounds: No wheezing, rhonchi or rales.  Musculoskeletal:     Cervical back: Normal range of motion and neck supple.  Skin:    General: Skin is warm and dry.  Neurological:     General: No focal deficit present.     Mental Status: She is alert and oriented to person, place, and time.     UC Treatments / Results  Labs (all labs ordered are listed, but only abnormal results are displayed) Labs Reviewed - No data to display  EKG   Radiology No results found.  Procedures Procedures (including critical care time)  Medications Ordered in UC Medications - No data to display  Initial Impression / Assessment and Plan / UC Course  I have reviewed the triage vital signs and the nursing notes.  Pertinent labs & imaging results that were available during my care of the patient were reviewed by me and considered in my  medical decision making (see chart for details).     MDM: 1.  Conjunctivitis of both eyes-Rx'd Vigamox. Advised/instructed patient to instill eyedrops as directed.  Advised/encouraged patient if symptoms worsen and/or unresolved please follow-up with ophthalmology/optometry for further evaluation.  Patient discharged home, hemodynamically stable. Final Clinical Impressions(s) / UC Diagnoses   Final diagnoses:  Conjunctivitis of both eyes, unspecified conjunctivitis type     Discharge Instructions      Advised/instructed patient to instill eyedrops as directed.  Advised/encouraged patient if symptoms worsen and/or unresolved please follow-up with ophthalmology/optometry  for further evaluation.     ED Prescriptions     Medication Sig Dispense Auth. Provider   moxifloxacin (VIGAMOX) 0.5 % ophthalmic solution Place 1 drop into both eyes 3 (three) times daily for 7 days. 3 mL Eliezer Lofts, FNP      PDMP not reviewed this encounter.   Eliezer Lofts, [place] 05/26/21 [phone]

## 2021-05-26 NOTE — ED Triage Notes (Signed)
Pt c/o LT eye drainage and redness since Wednesday. Light sensitivity.  Wed woke up with eye crusted shut. Hx of seasonal infections. Says she had a low grade fever Wed and Thurs.

## 2021-06-05 ENCOUNTER — Emergency Department (HOSPITAL_COMMUNITY): Payer: PPO

## 2021-06-05 ENCOUNTER — Other Ambulatory Visit: Payer: Self-pay | Admitting: Family Medicine

## 2021-06-05 ENCOUNTER — Other Ambulatory Visit: Payer: Self-pay

## 2021-06-05 ENCOUNTER — Other Ambulatory Visit: Payer: Self-pay | Admitting: Physician Assistant

## 2021-06-05 ENCOUNTER — Encounter (HOSPITAL_COMMUNITY): Payer: Self-pay

## 2021-06-05 ENCOUNTER — Inpatient Hospital Stay (HOSPITAL_COMMUNITY)
Admission: EM | Admit: 2021-06-05 | Discharge: 2021-06-09 | DRG: 193 | Disposition: A | Discharge status: Home / Self Care | Payer: PPO | Attending: Family Medicine | Admitting: Family Medicine

## 2021-06-05 ENCOUNTER — Other Ambulatory Visit: Payer: Self-pay | Admitting: Cardiovascular Disease

## 2021-06-05 DIAGNOSIS — Z90710 Acquired absence of both cervix and uterus: Secondary | ICD-10-CM | POA: Diagnosis not present

## 2021-06-05 DIAGNOSIS — R042 Hemoptysis: Secondary | ICD-10-CM | POA: Diagnosis present

## 2021-06-05 DIAGNOSIS — Z880 Allergy status to penicillin: Secondary | ICD-10-CM | POA: Diagnosis not present

## 2021-06-05 DIAGNOSIS — K219 Gastro-esophageal reflux disease without esophagitis: Secondary | ICD-10-CM | POA: Diagnosis not present

## 2021-06-05 DIAGNOSIS — E0520 Thyrotoxicosis with toxic multinodular goiter without thyrotoxic crisis or storm: Secondary | ICD-10-CM | POA: Diagnosis not present

## 2021-06-05 DIAGNOSIS — E041 Nontoxic single thyroid nodule: Secondary | ICD-10-CM | POA: Diagnosis not present

## 2021-06-05 DIAGNOSIS — E0590 Thyrotoxicosis, unspecified without thyrotoxic crisis or storm: Secondary | ICD-10-CM | POA: Diagnosis not present

## 2021-06-05 DIAGNOSIS — Z8616 Personal history of COVID-19: Secondary | ICD-10-CM

## 2021-06-05 DIAGNOSIS — Z7901 Long term (current) use of anticoagulants: Secondary | ICD-10-CM

## 2021-06-05 DIAGNOSIS — I10 Essential (primary) hypertension: Secondary | ICD-10-CM | POA: Diagnosis present

## 2021-06-05 DIAGNOSIS — R079 Chest pain, unspecified: Secondary | ICD-10-CM | POA: Diagnosis not present

## 2021-06-05 DIAGNOSIS — M8580 Other specified disorders of bone density and structure, unspecified site: Secondary | ICD-10-CM | POA: Diagnosis present

## 2021-06-05 DIAGNOSIS — Z881 Allergy status to other antibiotic agents status: Secondary | ICD-10-CM

## 2021-06-05 DIAGNOSIS — I110 Hypertensive heart disease with heart failure: Secondary | ICD-10-CM | POA: Diagnosis present

## 2021-06-05 DIAGNOSIS — I4891 Unspecified atrial fibrillation: Secondary | ICD-10-CM | POA: Diagnosis present

## 2021-06-05 DIAGNOSIS — J180 Bronchopneumonia, unspecified organism: Principal | ICD-10-CM | POA: Diagnosis present

## 2021-06-05 DIAGNOSIS — Z888 Allergy status to other drugs, medicaments and biological substances status: Secondary | ICD-10-CM

## 2021-06-05 DIAGNOSIS — J189 Pneumonia, unspecified organism: Secondary | ICD-10-CM

## 2021-06-05 DIAGNOSIS — J9601 Acute respiratory failure with hypoxia: Secondary | ICD-10-CM | POA: Diagnosis not present

## 2021-06-05 DIAGNOSIS — R0789 Other chest pain: Secondary | ICD-10-CM | POA: Diagnosis not present

## 2021-06-05 DIAGNOSIS — Z8249 Family history of ischemic heart disease and other diseases of the circulatory system: Secondary | ICD-10-CM

## 2021-06-05 DIAGNOSIS — R000 Tachycardia, unspecified: Secondary | ICD-10-CM | POA: Diagnosis not present

## 2021-06-05 DIAGNOSIS — J4521 Mild intermittent asthma with (acute) exacerbation: Secondary | ICD-10-CM

## 2021-06-05 DIAGNOSIS — I4819 Other persistent atrial fibrillation: Secondary | ICD-10-CM | POA: Diagnosis present

## 2021-06-05 DIAGNOSIS — Z87442 Personal history of urinary calculi: Secondary | ICD-10-CM | POA: Diagnosis not present

## 2021-06-05 DIAGNOSIS — J329 Chronic sinusitis, unspecified: Secondary | ICD-10-CM

## 2021-06-05 DIAGNOSIS — K5730 Diverticulosis of large intestine without perforation or abscess without bleeding: Secondary | ICD-10-CM | POA: Diagnosis present

## 2021-06-05 DIAGNOSIS — I5033 Acute on chronic diastolic (congestive) heart failure: Secondary | ICD-10-CM | POA: Diagnosis present

## 2021-06-05 DIAGNOSIS — Z79899 Other long term (current) drug therapy: Secondary | ICD-10-CM | POA: Diagnosis not present

## 2021-06-05 DIAGNOSIS — R0602 Shortness of breath: Secondary | ICD-10-CM | POA: Diagnosis not present

## 2021-06-05 DIAGNOSIS — R0902 Hypoxemia: Secondary | ICD-10-CM | POA: Diagnosis not present

## 2021-06-05 DIAGNOSIS — Z20822 Contact with and (suspected) exposure to covid-19: Secondary | ICD-10-CM | POA: Diagnosis not present

## 2021-06-05 DIAGNOSIS — J440 Chronic obstructive pulmonary disease with acute lower respiratory infection: Secondary | ICD-10-CM | POA: Diagnosis present

## 2021-06-05 DIAGNOSIS — I5032 Chronic diastolic (congestive) heart failure: Secondary | ICD-10-CM

## 2021-06-05 DIAGNOSIS — E876 Hypokalemia: Secondary | ICD-10-CM | POA: Diagnosis present

## 2021-06-05 DIAGNOSIS — R9431 Abnormal electrocardiogram [ECG] [EKG]: Secondary | ICD-10-CM | POA: Diagnosis not present

## 2021-06-05 DIAGNOSIS — M1990 Unspecified osteoarthritis, unspecified site: Secondary | ICD-10-CM | POA: Diagnosis not present

## 2021-06-05 DIAGNOSIS — J209 Acute bronchitis, unspecified: Secondary | ICD-10-CM

## 2021-06-05 LAB — RESPIRATORY PANEL BY PCR

## 2021-06-05 LAB — MRSA NEXT GEN BY PCR, NASAL: MRSA by PCR Next Gen: NOT DETECTED

## 2021-06-05 LAB — BRAIN NATRIURETIC PEPTIDE: B Natriuretic Peptide: 81.9 pg/mL (ref 0.0–100.0)

## 2021-06-05 LAB — BASIC METABOLIC PANEL
Anion gap: 11 (ref 5–15)
BUN: 14 mg/dL (ref 8–23)
CO2: 19 mmol/L — ABNORMAL LOW (ref 22–32)
Calcium: 9.3 mg/dL (ref 8.9–10.3)
Chloride: 109 mmol/L (ref 98–111)
Creatinine, Ser: 0.93 mg/dL (ref 0.44–1.00)
GFR, Estimated: >60 mL/min (ref >60)
Glucose, Bld: 101 mg/dL — ABNORMAL HIGH (ref 70–99)
Potassium: 3.3 mmol/L — ABNORMAL LOW (ref 3.5–5.1)
Sodium: 139 mmol/L (ref 135–145)

## 2021-06-05 LAB — I-STAT ARTERIAL BLOOD GAS, ED
Acid-base deficit: 3 mmol/L — ABNORMAL HIGH (ref 0.0–2.0)
Bicarbonate: 21.5 mmol/L (ref 20.0–28.0)
Calcium, Ion: 1.27 mmol/L (ref 1.15–1.40)
HCT: 38 % (ref 36.0–46.0)
Hemoglobin: 12.9 g/dL (ref 12.0–15.0)
O2 Saturation: 100 %
Patient temperature: 98.7
Potassium: 3.6 mmol/L (ref 3.5–5.1)
Sodium: 140 mmol/L (ref 135–145)
TCO2: 23 mmol/L (ref 22–32)
pCO2 arterial: 35.2 mmHg (ref 32–48)
pH, Arterial: 7.394 (ref 7.35–7.45)
pO2, Arterial: 174 mmHg — ABNORMAL HIGH (ref 83–108)

## 2021-06-05 LAB — MAGNESIUM: Magnesium: 1.7 mg/dL (ref 1.7–2.4)

## 2021-06-05 LAB — CBC
HCT: 37.6 % (ref 36.0–46.0)
Hemoglobin: 13.2 g/dL (ref 12.0–15.0)
MCH: 30.8 pg (ref 26.0–34.0)
MCHC: 35.1 g/dL (ref 30.0–36.0)
MCV: 87.9 fL (ref 80.0–100.0)
Platelets: 217 10*3/uL (ref 150–400)
RBC: 4.28 MIL/uL (ref 3.87–5.11)
RDW: 13.4 % (ref 11.5–15.5)
WBC: 8.5 10*3/uL (ref 4.0–10.5)
nRBC: 0 % (ref 0.0–0.2)

## 2021-06-05 LAB — TROPONIN I (HIGH SENSITIVITY)
Troponin I (High Sensitivity): 10 ng/L (ref <18)
Troponin I (High Sensitivity): 11 ng/L (ref <18)

## 2021-06-05 LAB — PROCALCITONIN: Procalcitonin: <0.1 ng/mL

## 2021-06-05 LAB — SARS CORONAVIRUS 2 BY RT PCR: SARS Coronavirus 2 by RT PCR: NEGATIVE

## 2021-06-05 LAB — TSH: TSH: 0.245 u[IU]/mL — ABNORMAL LOW (ref 0.350–4.500)

## 2021-06-05 MED ORDER — ONDANSETRON HCL 4 MG/2ML IJ SOLN: 4.0000 mg | Freq: Four times a day (QID) | INTRAMUSCULAR | Status: DC | PRN

## 2021-06-05 MED ORDER — POTASSIUM CHLORIDE CRYS ER 20 MEQ PO TBCR
40.0000 meq | EXTENDED_RELEASE_TABLET | Freq: Once | ORAL | Status: AC
  Administered 2021-06-05: 40 meq via ORAL
  Filled 2021-06-05: qty 2

## 2021-06-05 MED ORDER — MORPHINE SULFATE (PF) 2 MG/ML IV SOLN: 2.0000 mg | INTRAVENOUS | Status: DC | PRN

## 2021-06-05 MED ORDER — DOCUSATE SODIUM 100 MG PO CAPS
100.0000 mg | ORAL_CAPSULE | Freq: Two times a day (BID) | ORAL | Status: DC
  Administered 2021-06-06 – 2021-06-09 (×3): 100 mg via ORAL
  Filled 2021-06-05 (×4): qty 1

## 2021-06-05 MED ORDER — ACETAMINOPHEN 325 MG PO TABS: 650.0000 mg | ORAL_TABLET | Freq: Four times a day (QID) | ORAL | Status: DC | PRN

## 2021-06-05 MED ORDER — PANTOPRAZOLE SODIUM 40 MG PO TBEC
40.0000 mg | DELAYED_RELEASE_TABLET | Freq: Every day | ORAL | Status: DC
  Administered 2021-06-06 – 2021-06-09 (×4): 40 mg via ORAL
  Filled 2021-06-05 (×4): qty 1

## 2021-06-05 MED ORDER — LEVOFLOXACIN 750 MG PO TABS
750.0000 mg | ORAL_TABLET | Freq: Every day | ORAL | Status: AC
  Administered 2021-06-06 – 2021-06-09 (×4): 750 mg via ORAL
  Filled 2021-06-05 (×4): qty 1

## 2021-06-05 MED ORDER — OXYCODONE HCL 5 MG PO TABS: 5.0000 mg | ORAL_TABLET | ORAL | Status: DC | PRN

## 2021-06-05 MED ORDER — LEVOFLOXACIN IN D5W 750 MG/150ML IV SOLN
750.0000 mg | Freq: Once | INTRAVENOUS | Status: AC
Start: 2021-06-05 — End: 2021-06-05
  Administered 2021-06-05: 750 mg via INTRAVENOUS
  Filled 2021-06-05: qty 150

## 2021-06-05 MED ORDER — IPRATROPIUM-ALBUTEROL 0.5-2.5 (3) MG/3ML IN SOLN
3.0000 mL | Freq: Once | RESPIRATORY_TRACT | Status: AC
  Administered 2021-06-05: 3 mL via RESPIRATORY_TRACT
  Filled 2021-06-05: qty 3

## 2021-06-05 MED ORDER — DILTIAZEM HCL 25 MG/5ML IV SOLN
10.0000 mg | Freq: Once | INTRAVENOUS | Status: AC
  Administered 2021-06-05: 10 mg via INTRAVENOUS
  Filled 2021-06-05: qty 5

## 2021-06-05 MED ORDER — FLUTICASONE PROPIONATE 50 MCG/ACT NA SUSP
1.0000 | Freq: Every day | NASAL | Status: DC
  Administered 2021-06-06 – 2021-06-09 (×4): 1 via NASAL
  Filled 2021-06-05: qty 16

## 2021-06-05 MED ORDER — SODIUM CHLORIDE 0.9% FLUSH
3.0000 mL | Freq: Two times a day (BID) | INTRAVENOUS | Status: DC
  Administered 2021-06-05 – 2021-06-08 (×7): 3 mL via INTRAVENOUS

## 2021-06-05 MED ORDER — NADOLOL 40 MG PO TABS
40.0000 mg | ORAL_TABLET | Freq: Two times a day (BID) | ORAL | Status: DC
Start: 2021-06-05 — End: 2021-06-07
  Administered 2021-06-05 – 2021-06-06 (×3): 40 mg via ORAL
  Filled 2021-06-05 (×5): qty 1

## 2021-06-05 MED ORDER — LEVOFLOXACIN IN D5W 750 MG/150ML IV SOLN: 750.0000 mg | INTRAVENOUS | Status: DC

## 2021-06-05 MED ORDER — FUROSEMIDE 10 MG/ML IJ SOLN: 20.0000 mg | Freq: Once | INTRAMUSCULAR | Status: DC

## 2021-06-05 MED ORDER — DILTIAZEM HCL 60 MG PO TABS
30.0000 mg | ORAL_TABLET | ORAL | Status: DC | PRN
Start: 2021-06-05 — End: 2021-06-08
  Administered 2021-06-06 – 2021-06-07 (×6): 30 mg via ORAL
  Filled 2021-06-05 (×7): qty 1

## 2021-06-05 MED ORDER — BISACODYL 5 MG PO TBEC: 5.0000 mg | DELAYED_RELEASE_TABLET | Freq: Every day | ORAL | Status: DC | PRN

## 2021-06-05 MED ORDER — HYDRALAZINE HCL 20 MG/ML IJ SOLN: 5.0000 mg | INTRAMUSCULAR | Status: DC | PRN

## 2021-06-05 MED ORDER — POLYETHYLENE GLYCOL 3350 17 G PO PACK: 17.0000 g | PACK | Freq: Every day | ORAL | Status: DC | PRN

## 2021-06-05 MED ORDER — APIXABAN 5 MG PO TABS
5.0000 mg | ORAL_TABLET | Freq: Two times a day (BID) | ORAL | Status: DC
  Administered 2021-06-05 – 2021-06-09 (×8): 5 mg via ORAL
  Filled 2021-06-05 (×8): qty 1

## 2021-06-05 MED ORDER — LORATADINE 10 MG PO TABS
10.0000 mg | ORAL_TABLET | Freq: Every day | ORAL | Status: DC
  Administered 2021-06-06 – 2021-06-09 (×4): 10 mg via ORAL
  Filled 2021-06-05 (×4): qty 1

## 2021-06-05 MED ORDER — LISINOPRIL 20 MG PO TABS
40.0000 mg | ORAL_TABLET | Freq: Every day | ORAL | Status: DC
  Administered 2021-06-06: 40 mg via ORAL
  Filled 2021-06-05: qty 2

## 2021-06-05 MED ORDER — METHYLPREDNISOLONE SODIUM SUCC 125 MG IJ SOLR
125.0000 mg | Freq: Once | INTRAMUSCULAR | Status: AC
  Administered 2021-06-05: 125 mg via INTRAVENOUS
  Filled 2021-06-05: qty 2

## 2021-06-05 MED ORDER — ACETAMINOPHEN 650 MG RE SUPP: 650.0000 mg | Freq: Four times a day (QID) | RECTAL | Status: DC | PRN

## 2021-06-05 MED ORDER — ALBUTEROL SULFATE (2.5 MG/3ML) 0.083% IN NEBU: 2.5000 mg | INHALATION_SOLUTION | RESPIRATORY_TRACT | Status: DC | PRN

## 2021-06-05 MED ORDER — IOHEXOL 350 MG/ML SOLN
100.0000 mL | Freq: Once | INTRAVENOUS | Status: AC | PRN
  Administered 2021-06-05: 100 mL via INTRAVENOUS

## 2021-06-05 MED ORDER — METHYLPREDNISOLONE SODIUM SUCC 125 MG IJ SOLR
125.0000 mg | INTRAMUSCULAR | Status: DC
  Administered 2021-06-06: 125 mg via INTRAVENOUS
  Filled 2021-06-05: qty 2

## 2021-06-05 MED ORDER — ONDANSETRON HCL 4 MG PO TABS: 4.0000 mg | ORAL_TABLET | Freq: Four times a day (QID) | ORAL | Status: DC | PRN

## 2021-06-05 NOTE — ED Notes (Signed)
Patient asked to provide a urine sample when possible

## 2021-06-05 NOTE — ED Provider Notes (Signed)
Care of patient assumed from Dr. Maurie Boettcher at 7 AM.  This patient had an acute episode of shortness of breath that woke her up from sleep.  She has a new 4 L oxygen requirement.  She will need admission following ED work-up. Physical Exam  BP (!) 152/100   Pulse 86   Temp 98.7 F (37.1 C) (Oral)   Resp 14   Ht '5\' 4"'$  (1.626 m)   Wt 74.8 kg   SpO2 94%   BMI 28.32 kg/m   Physical Exam Vitals and nursing note reviewed.  Constitutional:      General: She is not in acute distress.    Appearance: She is well-developed. She is not toxic-appearing or diaphoretic.  HENT:     Head: Normocephalic and atraumatic.     Mouth/Throat:     Mouth: Mucous membranes are moist.     Pharynx: Oropharynx is clear.  Eyes:     Extraocular Movements: Extraocular movements intact.     Conjunctiva/sclera: Conjunctivae normal.  Neck:     Vascular: No JVD.  Cardiovascular:     Rate and Rhythm: Normal rate and regular rhythm.     Heart sounds: No murmur heard. Pulmonary:     Effort: Pulmonary effort is normal. No tachypnea, accessory muscle usage or respiratory distress.     Breath sounds: Rales present. No decreased breath sounds, wheezing or rhonchi.     Comments: 4L supp ox in place by nasal canula Chest:     Chest wall: No tenderness or crepitus.  Abdominal:     Palpations: Abdomen is soft.     Tenderness: There is no abdominal tenderness.  Musculoskeletal:        General: No swelling. Normal range of motion.     Cervical back: Normal range of motion and neck supple.  Skin:    General: Skin is warm and dry.     Capillary Refill: Capillary refill takes less than 2 seconds.     Coloration: Skin is not cyanotic or pale.  Neurological:     General: No focal deficit present.     Mental Status: She is alert and oriented to person, place, and time.  Psychiatric:        Mood and Affect: Mood normal.        Behavior: Behavior normal.    Procedures  Procedures  ED Course / MDM    Medical Decision  Making Amount and/or Complexity of Data Reviewed Labs: ordered. Radiology: ordered.  Risk Prescription drug management. Decision regarding hospitalization.   On assessment, patient sitting upright in bed, breathing unlabored on 4L supplemental oxygen with SpO2 in mid-90s. Crackles present on lung auscultation.   Patient was taken to [facility].  While there, she transferred onto the CT table on her own power.  This small amount of exertion caused severe worsening of her shortness of breath.  SPO2 dropped into the 70s.  Patient was placed on 15 L by nonrebreather.  SPO2 remained in the 80s.  BiPAP was initiated.  I did have a conversation with the patient who stated that she would be agreeable to intubation if BiPAP is unable to resolve her work of breathing and/or hypoxia.  Patient also at this time noted to have small-volume hemoptysis.  Patient's work of breathing and SPO2 normalized with initiation of BiPAP.  Patient was tolerating this well.  On CTA, there is no evidence of PE.  There are diffuse bilateral areas of opacities.  This could be secondary to cardiogenic  pulmonary edema and/or multifocal pneumonia.  BNP is normal.  At this point, I favor multifocal pneumonia.  Patient is pen allergic and Levaquin was initiated.  She was admitted to hospitalist for further management.         Godfrey Pick, MD 06/05/21 [phone]

## 2021-06-05 NOTE — Consult Note (Addendum)
NAME:  Anna Marquez, MRN:  [id number], DOB:  1950/11/01, LOS: 0 ADMISSION DATE:  06/05/2021, CONSULTATION DATE: 06/05/2021 REFERRING MD: Dr. Lorin Mercy, CHIEF COMPLAINT: Hemoptysis, shortness of breath  History of Present Illness:  Patient with cough, shortness of breath Came into the hospital today for worsening shortness of breath Cough only started today with some pinkish phlegm  Had had a low-grade fever for about 3 to 4 weeks, treated with a course of antibiotics and steroids with nonresolution of symptoms  History of atrial fibrillation, on anticoagulation No history of heart failure  Does have a history of asthmatic bronchitis which was felt to be exacerbated recently and this was the reason for recent antibiotic therapy   Pertinent  Medical History   Past Medical History:  Diagnosis Date   Adenomatous colon polyp 2003   Cholelithiasis    COVID    Diverticulosis of colon    Gastric polyps    GERD (gastroesophageal reflux disease)    Helicobacter pylori (H. pylori)    Hiatal hernia    Hypertension    Mitral valve prolapse    Nephrolithiasis    Osteoarthritis    Osteopenia 03/2017   T score -1.5 FRAX 8.9% / 0.9%.  Stable from prior DEXA 2016   Persistent atrial fibrillation (HCC)    TGA (transient global amnesia)      Significant Hospital Events: Including procedures, antibiotic start and stop dates in addition to other pertinent events   CT scan of the chest reviewed showing groundglass changes-reviewed by myself  Interim History / Subjective:  Shortness of breath, cough, hemoptysis  Objective   Blood pressure (!) 143/97, pulse 87, temperature 98.7 F (37.1 C), temperature source Oral, resp. rate (!) 25, height '5\' 4"'$  (1.626 m), weight 74.8 kg, SpO2 93 %.    FiO2 (%):  [80 %] 80 %  No intake or output data in the 24 hours ending 06/05/21 1237 Filed Weights   06/05/21 0559  Weight: 74.8 kg    Examination: General: Middle-age lady, does not appear to be in  distress HENT: Moist oral mucosa Lungs:   Few rales at the bases  rales at the cardiovascular: S1-S2 appreciated Abdomen: Bowel sounds appreciated Extremities: No clubbing, no edema Neuro: Alert and oriented x3 GU: Fair output  Echocardiogram reviewed showing ejection fraction of 60 to 65%, mild left ventricular hypertrophy grade 1 diastolic dysfunction Mitral valve regurg Resolved Hospital Problem list     Assessment & Plan:  Hypoxemic respiratory failure -Improved symptoms with being on BiPAP -Saturations better with being on BiPAP  -Continue BiPAP as tolerated  Hemoptysis -No pulmonary embolism -Multifocal infiltrate likely related to an infectious process  Acute bronchitis -S/p azithromycin, steroids -Continue bronchodilators  -Continue Levaquin, continue Solu-Medrol -Solu-Medrol every 8  Low-grade fevers less than 100  She states she goes into A-fib with being on steroids  History of atrial fibrillation s/p ablation -Has been on anticoagulation for 20 half years -On Eliquis chronically  Hypokalemia   Will treat with antibiotics, steroids, bronchodilators No clear indication for bronchoscopy at present  Best Practice (right click and "Reselect all SmartList Selections" daily)   Diet/type: Regular consistency (see orders) DVT prophylaxis: DOAC GI prophylaxis: N/A Lines: N/A Foley:  N/A Code Status:  full code Last date of multidisciplinary goals of care discussion [pending]  Labs   CBC: Recent Labs  Lab 06/05/21 0654 06/05/21 1012  WBC 8.5  --   HGB 13.2 12.9  HCT 37.6 38.0  MCV 87.9  --  PLT 217  --     Basic Metabolic Panel: Recent Labs  Lab 06/05/21 0654 06/05/21 0820 06/05/21 1012  NA 139  --  140  K 3.3*  --  3.6  CL 109  --   --   CO2 19*  --   --   GLUCOSE 101*  --   --   BUN 14  --   --   CREATININE 0.93  --   --   CALCIUM 9.3  --   --   MG  --  1.7  --    GFR: Estimated Creatinine Clearance: 54.1 mL/min (by C-G formula  based on SCr of 0.93 mg/dL). Recent Labs  Lab 06/05/21 0654  WBC 8.5    Liver Function Tests: No results for input(s): AST, ALT, ALKPHOS, BILITOT, PROT, ALBUMIN in the last 168 hours. No results for input(s): LIPASE, AMYLASE in the last 168 hours. No results for input(s): AMMONIA in the last 168 hours.  ABG    Component Value Date/Time   PHART 7.394 06/05/2021 1012   PCO2ART 35.2 06/05/2021 1012   PO2ART 174 (H) 06/05/2021 1012   HCO3 21.5 06/05/2021 1012   TCO2 23 06/05/2021 1012   ACIDBASEDEF 3.0 (H) 06/05/2021 1012   O2SAT 100 06/05/2021 1012     Coagulation Profile: No results for input(s): INR, PROTIME in the last 168 hours.  Cardiac Enzymes: No results for input(s): CKTOTAL, CKMB, CKMBINDEX, TROPONINI in the last 168 hours.  HbA1C: Hgb A1c MFr Bld  Date/Time Value Ref Range Status  08/21/2015 05:56 AM 5.1 4.8 - 5.6 % Final    Comment:    (NOTE)         Pre-diabetes: 5.7 - 6.4         Diabetes: >6.4         Glycemic control for adults with diabetes: <7.0     CBG: No results for input(s): GLUCAP in the last 168 hours.  Review of Systems:   Shortness of breath  Past Medical History:  She,  has a past medical history of Adenomatous colon polyp (2003), Cholelithiasis, COVID, Diverticulosis of colon, Gastric polyps, GERD (gastroesophageal reflux disease), Helicobacter pylori (H. pylori), Hiatal hernia, Hypertension, Mitral valve prolapse, Nephrolithiasis, Osteoarthritis, Osteopenia (03/2017), Persistent atrial fibrillation ([place]), and TGA (transient global amnesia).   Surgical History:   Past Surgical History:  Procedure Laterality Date   ANTERIOR AND POSTERIOR REPAIR N/A 06/14/2014   Procedure: ANTERIOR (CYSTOCELE) AND POSTERIOR REPAIR (RECTOCELE);  Surgeon: Anastasio Auerbach, MD;  Location: [facility];  Service: Gynecology;  Laterality: N/A;   ATRIAL FIBRILLATION ABLATION N/A 02/11/2019   Procedure: ATRIAL FIBRILLATION ABLATION;  Surgeon: Thompson Grayer, MD;   Location: [facility];  Service: Cardiovascular;  Laterality: N/A;   CARDIOVERSION N/A 01/15/2019   Procedure: CARDIOVERSION;  Surgeon: Fay Records, MD;  Location: [facility];  Service: Cardiovascular;  Laterality: N/A;   CHOLECYSTECTOMY     kidney stone removal     x2   VAGINAL HYSTERECTOMY N/A 06/14/2014   Procedure: HYSTERECTOMY VAGINAL;  Surgeon: Anastasio Auerbach, MD;  Location: [facility];  Service: Gynecology;  Laterality: N/A;     Social History:   reports that she has never smoked. She has never used smokeless tobacco. She reports that she does not drink alcohol and does not use drugs.   Family History:  Her family history includes Breast cancer (age of onset: 31) in her paternal aunt; Cancer in her father; Congestive Heart Failure in her mother;  Diabetes in her maternal grandmother; Heart disease in her maternal grandfather and maternal grandmother; Melanoma in her paternal aunt; Neuropathy in her mother; Thyroid disease in her daughter.   Allergies Allergies  Allergen Reactions   Erythromycin     Upset stomach   Nsaids Other (See Comments)    Has a history of bleeding ulcers, tolerates aspirin    Penicillins Hives and Shortness Of Breath    Did it involve swelling of the face/tongue/throat, SOB, or low BP? Yes Did it involve sudden or severe rash/hives, skin peeling, or any reaction on the inside of your mouth or nose? No Did you need to seek medical attention at a hospital or doctor's office? No When did it last happen?      45 years If all above answers are "NO", may proceed with cephalosporin use.      Home Medications  Prior to Admission medications   Medication Sig Start Date End Date Taking? Authorizing Provider  acetaminophen (TYLENOL) 500 MG tablet Take 1,000 mg by mouth every 6 (six) hours as needed for moderate pain or headache.   Yes [provider]  albuterol (PROVENTIL) (2.5 MG/3ML) 0.083% nebulizer solution Take 3 mLs (2.5 mg total) by  nebulization every 4 (four) hours as needed for wheezing or shortness of breath (please include nebulizer machine, hoses, and mask if needed.). Patient taking differently: Take 2.5 mg by nebulization every 4 (four) hours as needed for wheezing or shortness of breath. 05/09/21  Yes Breeback, Jade L, PA-C  albuterol (VENTOLIN HFA) 108 (90 Base) MCG/ACT inhaler INHALE 1 TO 2 PUFFS INTO THE LUNGS EVERY 6 HOURS AS NEEDED FOR WHEEZING OR SHORTNESS OF BREATH. Patient taking differently: Inhale 1-2 puffs into the lungs every 6 (six) hours as needed for shortness of breath or wheezing. 03/08/21  Yes [name], DO  Ascorbic Acid (VITAMIN C) 1000 MG tablet Take 1,000 mg by mouth daily.   Yes [provider]  Biotin 10000 MCG TABS Take 10,000 mcg by mouth daily.   Yes [provider]  Cholecalciferol (VITAMIN D) 50 MCG (2000 UT) tablet Take 2,000 Units by mouth daily.   Yes [provider]  diclofenac Sodium (VOLTAREN) 1 % GEL Apply 2-4 g topically 4 (four) times daily as needed (knees,ankles and hand pain).   Yes [provider]  diltiazem (CARDIZEM) 30 MG tablet TAKE 1 TABLET EVERY 4 HOURS AS NEEDED FOR HEART RATE GREATER THAN 100 Patient taking differently: Take 30 mg by mouth every 4 (four) hours as needed (heart rate greater than 100 and top bp has to be over 100). 05/25/21  Yes Sherran Needs, NP  ferrous sulfate 325 (65 FE) MG tablet Take 325 mg by mouth every other day.   Yes [provider]  fluticasone (FLONASE) 50 MCG/ACT nasal spray PLACE 1 SPRAY INTO BOTH NOSTRILS AS NEEDED FOR ALLERGIES OR RHINITIS. Patient taking differently: Place 1 spray into both nostrils daily. 03/07/20  Yes [name], DO  furosemide (LASIX) 20 MG tablet TAKE 1 TABLET (20 MG TOTAL) BY MOUTH DAILY AS NEEDED FOR EDEMA. 02/14/21  Yes Shirley Friar, PA-C  HYDROcodone bit-homatropine (HYCODAN) 5-1.5 MG/5ML syrup Take 5 mLs by mouth every 8 (eight) hours as needed for cough.  05/09/21  Yes Breeback, Jade L, PA-C  lisinopril (ZESTRIL) 40 MG tablet Take 1 tablet (40 mg total) by mouth daily. 02/26/21  Yes Shirley Friar, PA-C  loratadine (CLARITIN) 10 MG tablet Take 10 mg by mouth daily.  Yes [provider]  nadolol (CORGARD) 40 MG tablet Take 1 tablet (40 mg total) by mouth 2 (two) times daily. 07/12/20  Yes Allred, Jeneen Rinks, MD  pantoprazole (PROTONIX) 40 MG tablet TAKE 1 TABLET (40 MG TOTAL) BY MOUTH DAILY. 02/14/21  Yes [name], DO  potassium chloride (KLOR-CON) 10 MEQ tablet TAKE 1 TABLET BY MOUTH DAILY AS NEEDED (TAKE WITH HCTZ). Patient taking differently: Take 10 mEq by mouth daily as needed (when taking furosemide). 02/14/21  Yes Nahser, Wonda Cheng, MD  Probiotic Product (PROBIOTIC PO) Take 1 capsule by mouth daily.   Yes [provider]  vitamin E 180 MG (400 UNITS) capsule Take 400 Units by mouth daily.   Yes [provider]  ZINC CITRATE PO Take 22 mg by mouth daily.   Yes [provider]  ELIQUIS 5 MG TABS tablet TAKE 1 TABLET BY MOUTH 2 TIMES DAILY. 06/05/21   Nahser, Wonda Cheng, MD    Sherrilyn Rist, MD [place] P[facility]M Pager: See Shea Evans

## 2021-06-05 NOTE — ED Notes (Signed)
Patient transported to CT 

## 2021-06-05 NOTE — ED Notes (Signed)
Respiratory at bedside to d/c the bipap machine. Patient passes PO swallow screen and provided a meal and drink at this time

## 2021-06-05 NOTE — ED Triage Notes (Signed)
Pt bib [facility] from home s/o sob that woke her from her sleep. Dx bronchitis 3 weeks ago, completed abx course. Pt reports sob on exertion since. Inspiratory wheezing per EMS, cleared after duoneb. 89% RA, 98% 3L [place]. RR 20, BP 151/104. Also reports chest pain with deep breaths.

## 2021-06-05 NOTE — ED Notes (Signed)
Respiratory therapy at bedside with EDP. Pt placed on Bi-pap. Pt already looks more comfortable. Will continue to monitor.

## 2021-06-05 NOTE — ED Provider Notes (Signed)
[facility] Emergency Department Provider Note MRN:  [id number]  Arrival date & time: 06/05/21     Chief Complaint   Shortness of Breath   History of Present Illness   Anna Marquez is a 71 y.o. year-old female with a history of asthma presenting to the ED with chief complaint of shortness of.  Shortness of breath waking her from sleep.  Also with pain in the chest and thoracic back, somewhat worse with deep breaths.  Denies recent leg pain or swelling.  Persistent cough.  Review of Systems  A thorough review of systems was obtained and all systems are negative except as noted in the HPI and PMH.   Patient's Health History    Past Medical History:  Diagnosis Date   Adenomatous colon polyp 2003   Cholelithiasis    COVID    Diverticulosis of colon    Gastric polyps    GERD (gastroesophageal reflux disease)    Helicobacter pylori (H. pylori)    Hiatal hernia    Hypertension    Mitral valve prolapse    Nephrolithiasis    Osteoarthritis    Osteopenia 03/2017   T score -1.5 FRAX 8.9% / 0.9%.  Stable from prior DEXA 2016   Persistent atrial fibrillation (HCC)    TGA (transient global amnesia)     Past Surgical History:  Procedure Laterality Date   ANTERIOR AND POSTERIOR REPAIR N/A 06/14/2014   Procedure: ANTERIOR (CYSTOCELE) AND POSTERIOR REPAIR (RECTOCELE);  Surgeon: Anastasio Auerbach, MD;  Location: [facility];  Service: Gynecology;  Laterality: N/A;   ATRIAL FIBRILLATION ABLATION N/A 02/11/2019   Procedure: ATRIAL FIBRILLATION ABLATION;  Surgeon: Thompson Grayer, MD;  Location: [facility];  Service: Cardiovascular;  Laterality: N/A;   CARDIOVERSION N/A 01/15/2019   Procedure: CARDIOVERSION;  Surgeon: Fay Records, MD;  Location: [facility];  Service: Cardiovascular;  Laterality: N/A;   CHOLECYSTECTOMY     kidney stone removal     x2   VAGINAL HYSTERECTOMY N/A 06/14/2014   Procedure: HYSTERECTOMY VAGINAL;  Surgeon: Anastasio Auerbach, MD;   Location: [facility];  Service: Gynecology;  Laterality: N/A;    Family History  Problem Relation Age of Onset   Cancer Father        prostate   Breast cancer Paternal Aunt 52   Diabetes Maternal Grandmother    Heart disease Maternal Grandmother    Heart disease Maternal Grandfather    Melanoma Paternal Aunt    Neuropathy Mother    Congestive Heart Failure Mother    Thyroid disease Daughter     Social History   Socioeconomic History   Marital status: Divorced    Spouse name: Not on file   Number of children: 2   Years of education: 12th grade   Highest education level: High school graduate  Occupational History   Occupation: Retired,  Tobacco Use   Smoking status: Never   Smokeless tobacco: Never  Vaping Use   Vaping Use: Never used  Substance and Sexual Activity   Alcohol use: Never    Alcohol/week: 0.0 standard drinks   Drug use: No   Sexual activity: Yes    Birth control/protection: Post-menopausal    Comment: 1st intercourse 71 yo-Fewer than 5 partners  Other Topics Concern   Not on file  Social History Narrative   Lives along with her dog. She enjoys croteching, crafts, making wreaths, reading and gardening in her free time.    Social Determinants of Health  Financial Resource Strain: Low Risk    Difficulty of Paying Living Expenses: Not hard at all  Food Insecurity: No Food Insecurity   Worried About Charity fundraiser in the Last Year: Never true   Ran Out of Food in the Last Year: Never true  Transportation Needs: No Transportation Needs   Lack of Transportation (Medical): No   Lack of Transportation (Non-Medical): No  Physical Activity: Sufficiently Active   Days of Exercise per Week: 3 days   Minutes of Exercise per Session: 120 min  Stress: No Stress Concern Present   Feeling of Stress : Not at all  Social Connections: Moderately Isolated   Frequency of Communication with Friends and Family: More than three times a week   Frequency of Social  Gatherings with Friends and Family: More than three times a week   Attends Religious Services: More than 4 times per year   Active Member of Clubs or Organizations: No   Attends Archivist Meetings: Never   Marital Status: Divorced  Human resources officer Violence: Not At Risk   Fear of Current or Ex-Partner: No   Emotionally Abused: No   Physically Abused: No   Sexually Abused: No     Physical Exam   Vitals:   06/05/21 0557 06/05/21 0603  BP: (!) 152/100   Pulse: 86   Resp: 14   Temp:  98.7 F (37.1 C)  SpO2: 94%     CONSTITUTIONAL: Well-appearing, NAD NEURO/PSYCH:  Alert and oriented x 3, no focal deficits EYES:  eyes equal and reactive ENT/NECK:  no LAD, no JVD CARDIO: Regular rate, well-perfused, normal S1 and S2 PULM:  CTAB no wheezing or rhonchi, mild tachypnea GI/GU:  non-distended, non-tender MSK/SPINE:  No gross deformities, no edema SKIN:  no rash, atraumatic   *Additional and/or pertinent findings included in MDM below  Diagnostic and Interventional Summary    EKG Interpretation  Date/Time:  Tuesday Jun 05 2021 06:03:14 EDT Ventricular Rate:  82 PR Interval:  196 QRS Duration: 98 QT Interval:  381 QTC Calculation: 445 R Axis:   36 Text Interpretation: Sinus rhythm Atrial premature complex Confirmed by Gerlene Fee [phone]) on 06/05/2021 6:43:20 AM       Labs Reviewed  CBC  BASIC METABOLIC PANEL  BRAIN NATRIURETIC PEPTIDE  TROPONIN I (HIGH SENSITIVITY)    DG Chest Port 1 View  Final Result    CT Angio Chest Pulmonary Embolism (PE) W or WO Contrast    (Results Pending)    Medications  ipratropium-albuterol (DUONEB) 0.5-2.5 (3) MG/3ML nebulizer solution 3 mL (has no administration in time range)  methylPREDNISolone sodium succinate (SOLU-MEDROL) 125 mg/2 mL injection 125 mg (has no administration in time range)     Procedures  /  Critical Care .Critical Care Performed by: Maudie Flakes, MD Authorized by: Maudie Flakes, MD    Critical care provider statement:    Critical care time (minutes):  35   Critical care was necessary to treat or prevent imminent or life-threatening deterioration of the following conditions:  Respiratory failure   Critical care was time spent personally by me on the following activities:  Development of treatment plan with patient or surrogate, discussions with consultants, evaluation of patient's response to treatment, examination of patient, ordering and review of laboratory studies, ordering and review of radiographic studies, ordering and performing treatments and interventions, pulse oximetry, re-evaluation of patient's condition and review of old charts  ED Course and Medical Decision Making  Initial Impression and  Ddx Respiratory failure requiring 4 L nasal cannula, differential diagnosis includes asthma exacerbation, pneumonia, PE, ACS.  Awaiting labs, CT  Past medical/surgical history that increases complexity of ED encounter: Asthma  Interpretation of Diagnostics I personally reviewed the EKG and my interpretation is as follows: Sinus rhythm without concerning ischemic findings    Awaiting labs, x-ray, CT  Patient Reassessment and Ultimate Disposition/Management Signed out to oncoming provider.  Will need admission given this is a new oxygen requirement.  Patient management required discussion with the following services or consulting groups:  None  Complexity of Problems Addressed Acute illness or injury that poses threat of life of bodily function  Additional Data Reviewed and Analyzed Further history obtained from: Further history from spouse/family member  Additional Factors Impacting ED Encounter Risk Consideration of hospitalization  Anna Marquez, [place] [email or web address]  Final Clinical Impressions(s) / ED Diagnoses     ICD-10-CM   1. Acute respiratory failure with hypoxia (HCC)  J96.01        ED Discharge Orders     None        Discharge Instructions Discussed with and Provided to Patient:   Discharge Instructions   None      Maudie Flakes, MD 06/05/21 [phone]

## 2021-06-05 NOTE — ED Notes (Addendum)
Pt transported back from CT. Pt c/o being SHOB. CT had pt move over to the table on her own. Pt is anxious and stating she can't breathe. This RN placed pt on 5L [place]. Pt still taking breaths through her mouth and not her nose. This RN placed pt on Non-rebreather at 10L. EDP at bedside. EDP requesting pt be placed on Bi-pap. This RN will call respiratory therapy. Will continue to monitor.

## 2021-06-05 NOTE — Telephone Encounter (Signed)
Age 71, weight 165 lbs, SCr 0.93 checked today, last OV 03/2021, afib indication

## 2021-06-05 NOTE — H&P (Addendum)
History and Physical    Patient: Anna Marquez FFM:[id number] DOB: [birth year] DOA: 06/05/2021 DOS: the patient was seen and examined on 06/05/2021 PCP: Pcp, No  Patient coming from: Home - lives alone or with boyfriend; NOK: Daughter, Jeremy Johann, [phone]   Chief Complaint: SOB  HPI: Anna Marquez is a 71 y.o. female with medical history significant of HTN and afib s/p ablation presenting with SOB.  She was seen by her PCP office on 5/3 for acute bronchitis/sinobronchitis with COPD and given steroids, Zithromax, Albuterol nebs, and Hycodan.  She is a nonsmoker.  She began having symptoms maybe a month ago.  Mostly SOB, minimal cough.  Initially she had a URI and her daughter also had it, but she took prednisone and azithromycin with scant improvement, very short-lived.  Minimal LE edema, some abdominal edema and weight gain.  No night sweats.  No true fevers, "low grade" fevers to 100.1 throughout.  +wheezing.  She has never been a smoker.  When she takes steroids, she goes into afib.  SOB is profound with minimal exertion; she moved to the CT table and dropped her sats into the 70s.  Her SOB is significantly improved on BIPAP.    ER Course:  SOB, symptoms x 1 month.  Treated early in the month with prednisone, Z-pack.  Not on home O2, SOB with hypoxia overnight.  On 4-5L and doing ok but transferred beds in CT scanner with marked desat into 70s.  On BIPAP now.  FiO2 in 70s.  Echo in January.  +pulm edema vs. Multifocal opacities.  Given Levaquin.  Normal BNP so Lasix not given.  Mild hemoptysis, new.     Review of Systems: As mentioned in the history of present illness. All other systems reviewed and are negative. Past Medical History:  Diagnosis Date   Adenomatous colon polyp 2003   Cholelithiasis    COVID    Diverticulosis of colon    Gastric polyps    GERD (gastroesophageal reflux disease)    Helicobacter pylori (H. pylori)    Hiatal hernia    Hypertension    Mitral valve  prolapse    Nephrolithiasis    Osteoarthritis    Osteopenia 03/2017   T score -1.5 FRAX 8.9% / 0.9%.  Stable from prior DEXA 2016   Persistent atrial fibrillation (HCC)    TGA (transient global amnesia)    Past Surgical History:  Procedure Laterality Date   ANTERIOR AND POSTERIOR REPAIR N/A 06/14/2014   Procedure: ANTERIOR (CYSTOCELE) AND POSTERIOR REPAIR (RECTOCELE);  Surgeon: Anastasio Auerbach, MD;  Location: [facility];  Service: Gynecology;  Laterality: N/A;   ATRIAL FIBRILLATION ABLATION N/A 02/11/2019   Procedure: ATRIAL FIBRILLATION ABLATION;  Surgeon: Thompson Grayer, MD;  Location: [facility];  Service: Cardiovascular;  Laterality: N/A;   CARDIOVERSION N/A 01/15/2019   Procedure: CARDIOVERSION;  Surgeon: Fay Records, MD;  Location: [facility];  Service: Cardiovascular;  Laterality: N/A;   CHOLECYSTECTOMY     kidney stone removal     x2   VAGINAL HYSTERECTOMY N/A 06/14/2014   Procedure: HYSTERECTOMY VAGINAL;  Surgeon: Anastasio Auerbach, MD;  Location: [facility];  Service: Gynecology;  Laterality: N/A;   Social History:  reports that she has never smoked. She has never used smokeless tobacco. She reports that she does not drink alcohol and does not use drugs.  Allergies  Allergen Reactions   Erythromycin     Upset stomach   Nsaids Other (See Comments)  Has a history of bleeding ulcers, tolerates aspirin    Penicillins Hives and Shortness Of Breath    Did it involve swelling of the face/tongue/throat, SOB, or low BP? Yes Did it involve sudden or severe rash/hives, skin peeling, or any reaction on the inside of your mouth or nose? No Did you need to seek medical attention at a hospital or doctor's office? No When did it last happen?      45 years If all above answers are "NO", may proceed with cephalosporin use.     Family History  Problem Relation Age of Onset   Cancer Father        prostate   Breast cancer Paternal Aunt 104   Diabetes Maternal Grandmother    Heart  disease Maternal Grandmother    Heart disease Maternal Grandfather    Melanoma Paternal Aunt    Neuropathy Mother    Congestive Heart Failure Mother    Thyroid disease Daughter     Prior to Admission medications   Medication Sig Start Date End Date Taking? Authorizing Provider  acetaminophen (TYLENOL) 500 MG tablet Take 1,000 mg by mouth every 6 (six) hours as needed for moderate pain or headache.   Yes [provider]  albuterol (PROVENTIL) (2.5 MG/3ML) 0.083% nebulizer solution Take 3 mLs (2.5 mg total) by nebulization every 4 (four) hours as needed for wheezing or shortness of breath (please include nebulizer machine, hoses, and mask if needed.). Patient taking differently: Take 2.5 mg by nebulization every 4 (four) hours as needed for wheezing or shortness of breath. 05/09/21  Yes Breeback, Jade L, PA-C  albuterol (VENTOLIN HFA) 108 (90 Base) MCG/ACT inhaler INHALE 1 TO 2 PUFFS INTO THE LUNGS EVERY 6 HOURS AS NEEDED FOR WHEEZING OR SHORTNESS OF BREATH. Patient taking differently: Inhale 1-2 puffs into the lungs every 6 (six) hours as needed for shortness of breath or wheezing. 03/08/21  Yes [name], DO  Ascorbic Acid (VITAMIN C) 1000 MG tablet Take 1,000 mg by mouth daily.   Yes [provider]  Biotin 10000 MCG TABS Take 10,000 mcg by mouth daily.   Yes [provider]  Cholecalciferol (VITAMIN D) 50 MCG (2000 UT) tablet Take 2,000 Units by mouth daily.   Yes [provider]  diclofenac Sodium (VOLTAREN) 1 % GEL Apply 2-4 g topically 4 (four) times daily as needed (knees,ankles and hand pain).   Yes [provider]  diltiazem (CARDIZEM) 30 MG tablet TAKE 1 TABLET EVERY 4 HOURS AS NEEDED FOR HEART RATE GREATER THAN 100 Patient taking differently: Take 30 mg by mouth every 4 (four) hours as needed (heart rate greater than 100 and top bp has to be over 100). 05/25/21  Yes Sherran Needs, NP  ELIQUIS 5 MG TABS tablet TAKE 1 TABLET BY MOUTH 2  TIMES DAILY. Patient taking differently: Take 5 mg by mouth 2 (two) times daily. 12/06/20  Yes Nahser, Wonda Cheng, MD  ferrous sulfate 325 (65 FE) MG tablet Take 325 mg by mouth every other day.   Yes [provider]  fluticasone (FLONASE) 50 MCG/ACT nasal spray PLACE 1 SPRAY INTO BOTH NOSTRILS AS NEEDED FOR ALLERGIES OR RHINITIS. Patient taking differently: Place 1 spray into both nostrils daily. 03/07/20  Yes [name], DO  furosemide (LASIX) 20 MG tablet TAKE 1 TABLET (20 MG TOTAL) BY MOUTH DAILY AS NEEDED FOR EDEMA. 02/14/21  Yes Shirley Friar, PA-C  HYDROcodone bit-homatropine (HYCODAN) 5-1.5 MG/5ML syrup Take 5 mLs by mouth every  8 (eight) hours as needed for cough. 05/09/21  Yes Breeback, Jade L, PA-C  lisinopril (ZESTRIL) 40 MG tablet Take 1 tablet (40 mg total) by mouth daily. 02/26/21  Yes Shirley Friar, PA-C  loratadine (CLARITIN) 10 MG tablet Take 10 mg by mouth daily.    Yes [provider]  nadolol (CORGARD) 40 MG tablet Take 1 tablet (40 mg total) by mouth 2 (two) times daily. 07/12/20  Yes Allred, Jeneen Rinks, MD  pantoprazole (PROTONIX) 40 MG tablet TAKE 1 TABLET (40 MG TOTAL) BY MOUTH DAILY. 02/14/21  Yes [name], DO  potassium chloride (KLOR-CON) 10 MEQ tablet TAKE 1 TABLET BY MOUTH DAILY AS NEEDED (TAKE WITH HCTZ). Patient taking differently: Take 10 mEq by mouth daily as needed (when taking furosemide). 02/14/21  Yes Nahser, Wonda Cheng, MD  Probiotic Product (PROBIOTIC PO) Take 1 capsule by mouth daily.   Yes [provider]  vitamin E 180 MG (400 UNITS) capsule Take 400 Units by mouth daily.   Yes [provider]  ZINC CITRATE PO Take 22 mg by mouth daily.   Yes [provider]  azithromycin (ZITHROMAX) 250 MG tablet 2 tabs po x1 on Day 1, then 1 tab po daily on Days 2 - 5 Patient not taking: Reported on 06/05/2021 05/09/21   Donella Stade, PA-C  dexamethasone (DECADRON) 4 MG tablet Take 1 tablet (4 mg total) by mouth 2  (two) times daily with a meal. Patient not taking: Reported on 06/05/2021 05/09/21   Donella Stade, PA-C    Physical Exam: Vitals:   06/05/21 1045 06/05/21 1115 06/05/21 1145 06/05/21 1200  BP: (!) 147/99 (!) 142/98 (!) 142/99 (!) 143/97  Pulse: 85 87 87 87  Resp: (!) 26 (!) 25 (!) 22 (!) 25  Temp:      TempSrc:      SpO2: 96% 97% 95% 93%  Weight:      Height:       General:  Appears calm and comfortable and is in NAD, better on BIPAP Eyes:  PERRL, EOMI, normal lids, iris ENT:  grossly normal hearing, lips & tongue, mmm; BIPAP in place Neck:  no LAD, masses or thyromegaly Cardiovascular:  RRR, no m/r/g. No LE edema.  Respiratory:   Diffuse rales, L > R.  Mildly to significantly increased respiratory effort despite BIPAP, worse with minimal exertion. Abdomen:  soft, NT, ND Skin:  no rash or induration seen on limited exam Musculoskeletal:  grossly normal tone BUE/BLE, good ROM, no bony abnormality Psychiatric:  blunted mood and affect, speech fluent and appropriate but limited by BIPAP, AOx3 Neurologic:  CN 2-12 grossly intact, moves all extremities in coordinated fashion   Radiological Exams on Admission: Independently reviewed - see discussion in A/P where applicable  CT Angio Chest Pulmonary Embolism (PE) W or WO Contrast  Result Date: 06/05/2021 CLINICAL DATA:  Shortness of breath. EXAM: CT ANGIOGRAPHY CHEST WITH CONTRAST TECHNIQUE: Multidetector CT imaging of the chest was performed using the standard protocol during bolus administration of intravenous contrast. Multiplanar CT image reconstructions and MIPs were obtained to evaluate the vascular anatomy. RADIATION DOSE REDUCTION: This exam was performed according to the departmental dose-optimization program which includes automated exposure control, adjustment of the mA and/or kV according to patient size and/or use of iterative reconstruction technique. CONTRAST:  199m OMNIPAQUE IOHEXOL 350 MG/ML SOLN COMPARISON:  CT scan  08/20/2015. FINDINGS: Cardiovascular: The heart is borderline enlarged. No pericardial effusion. The aorta is normal in caliber. Scattered atherosclerotic calcifications.  The pulmonary arterial tree is fairly well opacified. No filling defects to suggest pulmonary embolism. Normal sized pulmonary arteries. Mediastinum/Nodes: No mediastinal or hilar mass or lymphadenopathy. Scattered hilar lymph nodes likely related to the pulmonary findings. The esophagus is grossly normal. There is a moderate-sized hiatal hernia. Enlarged thyroid gland with substernal extension, unchanged since 2017. This has been evaluated on previous imaging. (ref: J Am Coll Radiol. 2015 Feb;12(2): 143-50). Lungs/Pleura: Central pulmonary vascular congestion and diffuse but patchy asymmetric ground-glass opacity most likely pulmonary edema. There are also small bilateral pleural effusions. No focal airspace consolidation and no obvious underlying worrisome pulmonary lesions. Upper Abdomen: No significant upper abdominal findings. Musculoskeletal: No breast masses, supraclavicular or axillary adenopathy. Review of the MIP images confirms the above findings. IMPRESSION: 1. No pulmonary embolism. 2. Central pulmonary vascular congestion and diffuse but patchy asymmetric ground-glass opacity most likely pulmonary edema. 3. Small bilateral pleural effusions. 4. No mediastinal or hilar mass or adenopathy. 5. Moderate-sized hiatal hernia. 6. Aortic atherosclerosis. Aortic Atherosclerosis (ICD10-I70.0). Electronically Signed   By: Marijo Sanes M.D.   On: 06/05/2021 08:50   DG Chest Port 1 View  Result Date: 06/05/2021 CLINICAL DATA:  Shortness of breath. EXAM: PORTABLE CHEST 1 VIEW COMPARISON:  Chest CT 02/09/2019. FINDINGS: The heart enlarged, similar to prior study but with interval development of central vascular prominence and interstitial edema in the bases as well as trace pleural effusions. Findings consistent with mild CHF or fluid overload.  There are perihilar hazy opacities which could be ground-glass edema or pneumonitis. Lungs are otherwise clear of focal opacities. The mediastinal configuration is stable. There is mild aortic atherosclerosis. Thoracic cage grossly intact. IMPRESSION: Cardiomegaly with perihilar vascular congestion and lower zonal interstitial edema consistent with CHF or fluid overload, with minimal pleural effusions forming. Perihilar interstitial haziness, which could be due to ground-glass edema or pneumonitis. Electronically Signed   By: Telford Nab M.D.   On: 06/05/2021 06:41    EKG: Independently reviewed.  NSR with rate 82; no evidence of acute ischemia   Labs on Admission: I have personally reviewed the available labs and imaging studies at the time of the admission.  Pertinent labs:    K+ 3.3 CO2 19 BNP 81.9 HS troponin 10 Normal CBC   Assessment and Plan: Principal Problem:   Acute respiratory failure with hypoxia (HCC) Active Problems:   A-fib (HCC)   Essential hypertension    Acute respiratory failure with hypoxia -Patient is presenting with 30-monthof SOB -She was treated with steroids/azithro earlier this month with only transient improvement -No lifetime smoking history or prior lung disease -She was profoundly SOB and hypoxic with minimal exertion in the ER and was placed on BIPAP with improvement -ABG on BIPAP is appropriate -She does not have fever or leukocytosis.  -Chest x-ray/CTA are not definitive - suggestive of CHF although no evidence of volume overload on exam and normal BNP, seems less likely -No PE -Possibly multifocal PNA, although not clearly infectious; will check legionella Ag, RVP, and procalcitonin; she was given a dose of Levaquin in the ER but will hold further abx pending pulm input -ILD is a consideration based on clinical exam and CTA findings; she may benefit from bronch and/or steroids -For now, will admit to progressive care -Pulmonology  consult -Nebulizers: prn albuterol  HTN -Continue lisinopril, nadolol  Afib -Continue Eliquis -Continue Nadolol for rate control, prn diltiazem      Advance Care Planning:   Code Status: Full Code   Consults:  Pulmonology; RT; TOC team  DVT Prophylaxis: Lovenox  Family Communication: Daughter and boyfriend were present throughout evaluation  Severity of Illness: The appropriate patient status for this patient is INPATIENT. Inpatient status is judged to be reasonable and necessary in order to provide the required intensity of service to ensure the patient's safety. The patient's presenting symptoms, physical exam findings, and initial radiographic and laboratory data in the context of their chronic comorbidities is felt to place them at high risk for further clinical deterioration. Furthermore, it is not anticipated that the patient will be medically stable for discharge from the hospital within 2 midnights of admission.   * I certify that at the point of admission it is my clinical judgment that the patient will require inpatient hospital care spanning beyond 2 midnights from the point of admission due to high intensity of service, high risk for further deterioration and high frequency of surveillance required.*  Author: Karmen Bongo, MD 06/05/2021 1:46 PM  For on call review [email or web address].

## 2021-06-05 NOTE — ED Notes (Signed)
Medication requested from pharmacy at this time 

## 2021-06-06 DIAGNOSIS — I4891 Unspecified atrial fibrillation: Secondary | ICD-10-CM

## 2021-06-06 DIAGNOSIS — J9601 Acute respiratory failure with hypoxia: Secondary | ICD-10-CM | POA: Diagnosis not present

## 2021-06-06 DIAGNOSIS — I10 Essential (primary) hypertension: Secondary | ICD-10-CM | POA: Diagnosis not present

## 2021-06-06 LAB — CBC
HCT: 35.2 % — ABNORMAL LOW (ref 36.0–46.0)
Hemoglobin: 12.1 g/dL (ref 12.0–15.0)
MCH: 30.2 pg (ref 26.0–34.0)
MCHC: 34.4 g/dL (ref 30.0–36.0)
MCV: 87.8 fL (ref 80.0–100.0)
Platelets: 247 10*3/uL (ref 150–400)
RBC: 4.01 MIL/uL (ref 3.87–5.11)
RDW: 13.6 % (ref 11.5–15.5)
WBC: 10.4 10*3/uL (ref 4.0–10.5)
nRBC: 0 % (ref 0.0–0.2)

## 2021-06-06 LAB — BASIC METABOLIC PANEL
Anion gap: 7 (ref 5–15)
BUN: 15 mg/dL (ref 8–23)
CO2: 20 mmol/L — ABNORMAL LOW (ref 22–32)
Calcium: 9.1 mg/dL (ref 8.9–10.3)
Chloride: 111 mmol/L (ref 98–111)
Creatinine, Ser: 0.97 mg/dL (ref 0.44–1.00)
GFR, Estimated: >60 mL/min (ref >60)
Glucose, Bld: 134 mg/dL — ABNORMAL HIGH (ref 70–99)
Potassium: 3.8 mmol/L (ref 3.5–5.1)
Sodium: 138 mmol/L (ref 135–145)

## 2021-06-06 LAB — EXPECTORATED SPUTUM ASSESSMENT W GRAM STAIN, RFLX TO RESP C

## 2021-06-06 MED ORDER — FUROSEMIDE 10 MG/ML IJ SOLN
40.0000 mg | Freq: Once | INTRAMUSCULAR | Status: AC
  Administered 2021-06-06: 40 mg via INTRAVENOUS
  Filled 2021-06-06: qty 4

## 2021-06-06 MED ORDER — PREDNISONE 20 MG PO TABS
20.0000 mg | ORAL_TABLET | Freq: Every day | ORAL | Status: DC
  Administered 2021-06-07 – 2021-06-09 (×3): 20 mg via ORAL
  Filled 2021-06-06 (×3): qty 1

## 2021-06-06 MED ORDER — FUROSEMIDE 10 MG/ML IJ SOLN
40.0000 mg | Freq: Two times a day (BID) | INTRAMUSCULAR | Status: DC
  Administered 2021-06-06: 40 mg via INTRAVENOUS
  Filled 2021-06-06: qty 4

## 2021-06-06 NOTE — TOC Progression Note (Signed)
Transition of Care [facility]) - Progression Note    Patient Details  Name: ANISTON CHRISTMAN MRN: [id number] Date of Birth: 1950/07/19  Transition of Care [facility]) CM/SW [place], RN Phone Number:[phone]  06/06/2021, 10:14 AM  Clinical Narrative:     Transition of Care Department [facility]) has reviewed patient and no TOC needs have been identified at this time. We will continue to monitor patient advancement through interdisciplinary progression rounds. TOC acknowledged general consult. TOC will continue to follow for needs.         Expected Discharge Plan and Services                                                 Social Determinants of Health (SDOH) Interventions    Readmission Risk Interventions     View : No data to display.

## 2021-06-06 NOTE — Progress Notes (Signed)
   NAME:  Anna Marquez, MRN:  [id number], DOB:  April 26, 1950, LOS: 1 ADMISSION DATE:  06/05/2021, CONSULTATION DATE: 06/05/2021 REFERRING MD: Dr. Lorin Mercy, CHIEF COMPLAINT: Hemoptysis, shortness of breath  History of Present Illness:  Patient with cough, shortness of breath Came into the hospital today for worsening shortness of breath Cough only started today with some pinkish phlegm   Had had a low-grade fever for about 3 to 4 weeks, treated with a course of antibiotics and steroids with nonresolution of symptoms   History of atrial fibrillation, on anticoagulation No history of heart failure   Does have a history of asthmatic bronchitis which was felt to be exacerbated recently and this was the reason for recent antibiotic therapy  Pertinent  Medical History   Past Medical History:  Diagnosis Date   Adenomatous colon polyp 2003   Cholelithiasis    COVID    Diverticulosis of colon    Gastric polyps    GERD (gastroesophageal reflux disease)    Helicobacter pylori (H. pylori)    Hiatal hernia    Hypertension    Mitral valve prolapse    Nephrolithiasis    Osteoarthritis    Osteopenia 03/2017   T score -1.5 FRAX 8.9% / 0.9%.  Stable from prior DEXA 2016   Persistent atrial fibrillation (HCC)    TGA (transient global amnesia)      Significant Hospital Events: Including procedures, antibiotic start and stop dates in addition to other pertinent events   CT scan of the chest reviewed showing groundglass changes-reviewed by myself  Interim History / Subjective:  She generally feels better Off BiPAP On oxygen supplementation No episodes of hemoptysis Heart rate has been up-in A-fib  Objective   Blood pressure 110/77, pulse (!) 109, temperature 98.7 F (37.1 C), temperature source Oral, resp. rate 20, height '5\' 4"'$  (1.626 m), weight 76.3 kg, SpO2 95 %.        Intake/Output Summary (Last 24 hours) at 06/06/2021 1215 Last data filed at 06/06/2021 0848 Gross per 24 hour  Intake  240 ml  Output 850 ml  Net -610 ml   Filed Weights   06/05/21 0559 06/05/21 1750  Weight: 74.8 kg 76.3 kg    Examination: General: Elderly lady, does not appear to be in distress HENT: Moist oral mucosa Lungs: Clear breath sounds Cardiovascular: S1-S2 appreciated Abdomen: Bowel sounds appreciated Extremities: No clubbing, no edema Neuro: Alert and oriented x3 GU: [facility] Problem list     Assessment & Plan:  Hypoxemic respiratory failure -Improved -On oxygen supplementation at present  Has not required BiPAP  Hemoptysis -Likely related to multilobar pneumonia -Continue and complete course of Levaquin -Continue and complete course of steroids-switched to prednisone 20 mg  Atrial fibrillation -Being treated at present -S/p ablation in the past -Chronically on Eliquis   Improvement in symptoms overall -Complete course of antibiotics and steroids -Will continue to follow  Sherrilyn Rist, MD [place] [facility] Pager: See Shea Evans

## 2021-06-06 NOTE — Progress Notes (Signed)
Mobility Specialist Progress Note    06/06/21 1224  Mobility  Activity Ambulated independently in hallway  Level of Assistance Standby assist, set-up cues, supervision of patient - no hands on  Assistive Device None  Distance Ambulated (ft) 380 ft  Activity Response Tolerated fair  $Mobility charge 1 Mobility   Pre-Mobility: 118 HR, 92% on 5L SpO2 During Mobility: 123 HR, 93% on 4L SpO2 Post-Mobility: 115 HR, 90% SpO2  Pt received in bed and agreeable. Had void in BR. Ambulated on 4LO2 and maintained SpO2 >/=90% mostly. Took x1 short standing rest break with SpO2 at 86% but recovered with pursed lip breathing to 90%+. Returned to void in BR then sit EOB. Left on 4LO2 as advised by RN.   Hildred Alamin Mobility Specialist  Primary: 5N M.S. Phone: [phone] Secondary: 6N M.S. Phone: [phone]

## 2021-06-06 NOTE — Progress Notes (Signed)
Pt showing no resp distress at this time.  BIPAP not in use or needed.

## 2021-06-06 NOTE — Progress Notes (Addendum)
  Progress Note   Patient: Anna Marquez PVV:[id number] DOB: 30-Apr-1950 DOA: 06/05/2021     1 DOS: the patient was seen and examined on 06/06/2021       Brief hospital course: Mrs. Anna Marquez is a 71 y.o. F admitted for respiratory failure.      Assessment and Plan: Acute respiratory failure with hypoxia Possibly due to pneumonia, asthmatic bronchitis, acute on chronic diastolic congestive heart failure. - Continue Levaquin - Continue prednisone - IV Lasix today   -Strict ins and outs - Monitor potassium and daily BMP   Hypertension Blood pressure controlled - Continue lisinopril, nadolol  Paroxysmal atrial fibrillation Heart rate elevated but improving, got diltiazem once -Continue Eliquis, nadolol  Abnormal TSH -Check T3 and free T4      Subjective: Overall slightly better but still hypoxic and out of breath with exertion     Physical Exam: Vitals:   06/06/21 1039 06/06/21 1114 06/06/21 1645 06/06/21 1739  BP: 110/77  112/70 107/81  Pulse:  (!) 109 99   Resp:  20 19   Temp:  98.7 F (37.1 C) 97.6 F (36.4 C)   TempSrc:  Oral Oral   SpO2:  95% 95%   Weight:      Height:       Elderly adult female, sitting up in bed, no rales on exam, respiratory effort appears out of breath, still on 5 L, irregularly irregular heart rhythm, no peripheral edema visible  Data Reviewed: Discussed with pulmonology Basic metabolic panel, troponins, blood counts, TSH, procalcitonin, and CT angiogram reviewed  Family Communication: Daughter at the bedside    Disposition: Status is: Inpatient         Author: Edwin Dada, MD 06/06/2021 6:43 PM  For on call review [email or web address].

## 2021-06-07 ENCOUNTER — Inpatient Hospital Stay (HOSPITAL_COMMUNITY): Payer: PPO

## 2021-06-07 DIAGNOSIS — J9601 Acute respiratory failure with hypoxia: Secondary | ICD-10-CM | POA: Diagnosis not present

## 2021-06-07 DIAGNOSIS — I4819 Other persistent atrial fibrillation: Secondary | ICD-10-CM

## 2021-06-07 DIAGNOSIS — I5032 Chronic diastolic (congestive) heart failure: Secondary | ICD-10-CM

## 2021-06-07 DIAGNOSIS — I5033 Acute on chronic diastolic (congestive) heart failure: Secondary | ICD-10-CM | POA: Diagnosis not present

## 2021-06-07 DIAGNOSIS — I10 Essential (primary) hypertension: Secondary | ICD-10-CM | POA: Diagnosis not present

## 2021-06-07 DIAGNOSIS — J189 Pneumonia, unspecified organism: Secondary | ICD-10-CM | POA: Diagnosis not present

## 2021-06-07 DIAGNOSIS — E0590 Thyrotoxicosis, unspecified without thyrotoxic crisis or storm: Secondary | ICD-10-CM

## 2021-06-07 LAB — BASIC METABOLIC PANEL
Anion gap: 9 (ref 5–15)
BUN: 21 mg/dL (ref 8–23)
CO2: 22 mmol/L (ref 22–32)
Calcium: 9.2 mg/dL (ref 8.9–10.3)
Chloride: 109 mmol/L (ref 98–111)
Creatinine, Ser: 1.12 mg/dL — ABNORMAL HIGH (ref 0.44–1.00)
GFR, Estimated: 52 mL/min — ABNORMAL LOW (ref >60)
Glucose, Bld: 149 mg/dL — ABNORMAL HIGH (ref 70–99)
Potassium: 3.6 mmol/L (ref 3.5–5.1)
Sodium: 140 mmol/L (ref 135–145)

## 2021-06-07 LAB — CBC
HCT: 35.8 % — ABNORMAL LOW (ref 36.0–46.0)
Hemoglobin: 12.2 g/dL (ref 12.0–15.0)
MCH: 30.3 pg (ref 26.0–34.0)
MCHC: 34.1 g/dL (ref 30.0–36.0)
MCV: 88.8 fL (ref 80.0–100.0)
Platelets: 236 10*3/uL (ref 150–400)
RBC: 4.03 MIL/uL (ref 3.87–5.11)
RDW: 13.6 % (ref 11.5–15.5)
WBC: 8 10*3/uL (ref 4.0–10.5)
nRBC: 0 % (ref 0.0–0.2)

## 2021-06-07 LAB — T4, FREE: Free T4: 1.5 ng/dL — ABNORMAL HIGH (ref 0.61–1.12)

## 2021-06-07 MED ORDER — FUROSEMIDE 10 MG/ML IJ SOLN
60.0000 mg | Freq: Once | INTRAMUSCULAR | Status: AC
  Administered 2021-06-07: 60 mg via INTRAVENOUS
  Filled 2021-06-07: qty 6

## 2021-06-07 MED ORDER — NADOLOL 40 MG PO TABS
80.0000 mg | ORAL_TABLET | Freq: Two times a day (BID) | ORAL | Status: DC
  Administered 2021-06-07 – 2021-06-08 (×4): 80 mg via ORAL
  Filled 2021-06-07 (×5): qty 2

## 2021-06-07 MED ORDER — LISINOPRIL 10 MG PO TABS
10.0000 mg | ORAL_TABLET | Freq: Every day | ORAL | Status: DC
  Administered 2021-06-07: 10 mg via ORAL
  Filled 2021-06-07: qty 1

## 2021-06-07 NOTE — Assessment & Plan Note (Addendum)
Presented with hypoxia to 85% and requiring 5L O2 in the ER. Probably due to bronchopneumonia, CHF flare.   Treated with few doses furosemide, now stopped.   - Continue Levaquin day 4 of 5 - Continue prednisone day 4 of 5 - Continue pulmonary toilet - Continue PPI - Continue albuterol PRN and Flonase

## 2021-06-07 NOTE — Assessment & Plan Note (Signed)
-   Continue nadolol - Hold lisinopril today given renal function

## 2021-06-07 NOTE — Discharge Instructions (Signed)

## 2021-06-07 NOTE — Progress Notes (Signed)
Mobility Specialist Progress Note    06/07/21 1416  Mobility  Activity Ambulated independently in hallway  Level of Assistance Independent  Assistive Device None  Distance Ambulated (ft) 380 ft  Activity Response Tolerated well  $Mobility charge 1 Mobility   Pt received sitting EOB and agreeable. Able to wean to RA with SpO2 in mid 90s. No complaints. Returned to sitting EOB, RN advised to leave [place] off for monitoring. Call bell in reach.   Hildred Alamin Mobility Specialist  Primary: 5N M.S. Phone: [phone] Secondary: 6N M.S. Phone: [phone]

## 2021-06-07 NOTE — Progress Notes (Signed)
   NAME:  Anna Marquez, MRN:  [id number], DOB:  1950/07/17, LOS: 2 ADMISSION DATE:  06/05/2021, CONSULTATION DATE: 06/05/2021 REFERRING MD: Dr. Lorin Mercy, CHIEF COMPLAINT: Hemoptysis, shortness of breath  History of Present Illness:  Patient with cough, shortness of breath Came into the hospital today for worsening shortness of breath Cough only started today with some pinkish phlegm   Had had a low-grade fever for about 3 to 4 weeks, treated with a course of antibiotics and steroids with nonresolution of symptoms   History of atrial fibrillation, on anticoagulation No history of heart failure   Does have a history of asthmatic bronchitis which was felt to be exacerbated recently and this was the reason for recent antibiotic therapy  Pertinent  Medical History   Past Medical History:  Diagnosis Date   Adenomatous colon polyp 2003   Cholelithiasis    COVID    Diverticulosis of colon    Gastric polyps    GERD (gastroesophageal reflux disease)    Helicobacter pylori (H. pylori)    Hiatal hernia    Hypertension    Mitral valve prolapse    Nephrolithiasis    Osteoarthritis    Osteopenia 03/2017   T score -1.5 FRAX 8.9% / 0.9%.  Stable from prior DEXA 2016   Persistent atrial fibrillation (HCC)    TGA (transient global amnesia)      Significant Hospital Events: Including procedures, antibiotic start and stop dates in addition to other pertinent events   CT scan of the chest reviewed showing groundglass changes-reviewed by myself  Interim History / Subjective:  Continues to feel well Has not required BiPAP She is on oxygen supplementation No recurrence of the hemoptysis Heart rate still up from A-fib  Objective   Blood pressure (!) 125/94, pulse (!) 118, temperature 97.8 F (36.6 C), temperature source Oral, resp. rate 15, height '5\' 4"'$  (1.626 m), weight 76.3 kg, SpO2 94 %.        Intake/Output Summary (Last 24 hours) at 06/07/2021 1114 Last data filed at 06/07/2021 0524 Gross  per 24 hour  Intake 480 ml  Output 1125 ml  Net -645 ml   Filed Weights   06/05/21 0559 06/05/21 1750  Weight: 74.8 kg 76.3 kg    Examination: General: Elderly, does not appear to be in distress  HENT: Moist oral mucosa Lungs: Clear breath sounds bilaterally Cardiovascular: S1-S2 appreciated with no murmur Abdomen: Bowel sounds appreciated Extremities: No peripheral edema Neuro: Alert and oriented x3 GU: [facility] Problem list     Assessment & Plan:  Hypoxemic respiratory failure -Improved -On oxygen supplementation  Hemoptysis -Likely related to multilobar pneumonia -Continue on complete course of Levaquin -Continue and complete course of steroids -On 20 mg of prednisone at present  Atrial fibrillation -S/p ablation in the past -Chronically on Eliquis  There is overall improvement in symptoms -Day 3/5 of Levaquin  Wean oxygen supplementation as tolerated  Sherrilyn Rist, MD [facility] Pager: See Shea Evans

## 2021-06-07 NOTE — Assessment & Plan Note (Addendum)
-   Continue nadolol, increased dose yesterday - Continue Eliquis - Consult Cardiology, regarding rhythm vs rate control strategies at this time

## 2021-06-07 NOTE — Plan of Care (Signed)
?  Problem: Education: ?Goal: Knowledge of disease or condition will improve ?Outcome: Progressing ?Goal: Knowledge of the prescribed therapeutic regimen will improve ?Outcome: Progressing ?Goal: Individualized Educational Video(s) ?Outcome: Progressing ?  ?Problem: Activity: ?Goal: Ability to tolerate increased activity will improve ?Outcome: Progressing ?Goal: Will verbalize the importance of balancing activity with adequate rest periods ?Outcome: Progressing ?  ?

## 2021-06-07 NOTE — Progress Notes (Signed)
  Progress Note   Patient: Anna Marquez CBS:[id number] DOB: 1950-05-31 DOA: 06/05/2021     2 DOS: the patient was seen and examined on 06/07/2021 at 2:30PM      Brief hospital course: Mrs. Ferrufino is a 71 y.o. F with AF on Eliquis, HTN and d/CHF who presented with few weeks respiratory symptoms, initially treated as COPD flare, then more characterized by dyspnea on exertion.  In the ER, CTA ruled out PE, showed edema and effusions.  BNP normal.  Started on antibiotics, steroids and Pulmonary consulted.     Assessment and Plan: * Acute respiratory failure with hypoxia (HCC) Probably due to bronchopneumonia, CHF flare.   - Continue furosemide today - Continue Levaquin, day 3 of 5 - Continue pulmonary toilet - Continue prednisone - Continue PPI - Continue albueerol PRN and Flonase    CAP (community acquired pneumonia) See above  Acute on chronic diastolic CHF (congestive heart failure) ([place]) See above  Hyperthyroidism TSH low and fT4 elevated.  - Check TSIs and Thryroid [place] - Follow T3  Essential hypertension - Continue lisinopril and nadolol  Persistent atrial fibrillation (HCC) - Continue nadolol, increase dose - Continue Eliquis          Subjective: Somewhat better, off oxygen this afternoon.  No fever, confusion.  No further sputum     Physical Exam: Vitals:   06/07/21 0700 06/07/21 0913 06/07/21 1100 06/07/21 1524  BP: (!) 125/94  (!) 121/100 112/72  Pulse: (!) 108 (!) 118 87   Resp: 15  20   Temp: 97.8 F (36.6 C)  97.8 F (36.6 C) 97.6 F (36.4 C)  TempSrc: Oral  Oral   SpO2: 94%  93%   Weight:      Height:       Adult female, sitting in the edge of the bed, interactive, appropriate Tachycardic, irregularly irregular, no murmurs, no peripheral edema, no JVD Respiratory effort normal, lungs clear without wheezing, maybe some coarse breath sounds at the bases Attention normal, affect appropriate, judgment Syprine normal  Data  Reviewed: Discussed with pulmonology, nursing notes reviewed, vital signs reviewed Patient metabolic panel shows sodium and potassium normal, creatinine slightly up Complete blood count normal CT angiogram chest effusions and edema TSH low, free T4 elevated, sputum poor quality      Disposition: Status is: Inpatient         Author: Edwin Dada, MD 06/07/2021 5:15 PM  For on call review [email or web address].

## 2021-06-07 NOTE — Assessment & Plan Note (Signed)
TSH low and fT4 elevated.  - Check TSIs and Thryroid [place] - Follow T3

## 2021-06-07 NOTE — Hospital Course (Signed)
Anna Marquez is a 71 y.o. F with AF on Eliquis, HTN and d/CHF who presented with few weeks respiratory symptoms, initially treated as COPD flare, then more characterized by dyspnea on exertion.  In the ER, CTA ruled out PE, showed edema and effusions.  BNP normal.  Started on antibiotics, steroids and Pulmonary consulted.

## 2021-06-07 NOTE — Assessment & Plan Note (Addendum)
Presented with exertional dyspnea (she now says this was kind of like fatigue rather than dyspnea), wheezing, and had evidence of effusions and edema on chest imaging.  Improved with Lasix.  Probably some mild failure in setting of rapid Afib, diastolic dysfunction. Cr slightly up, will hold further lasix.

## 2021-06-07 NOTE — Assessment & Plan Note (Signed)
See above

## 2021-06-08 DIAGNOSIS — E041 Nontoxic single thyroid nodule: Secondary | ICD-10-CM

## 2021-06-08 DIAGNOSIS — J189 Pneumonia, unspecified organism: Secondary | ICD-10-CM | POA: Diagnosis not present

## 2021-06-08 DIAGNOSIS — I5033 Acute on chronic diastolic (congestive) heart failure: Secondary | ICD-10-CM | POA: Diagnosis not present

## 2021-06-08 DIAGNOSIS — I10 Essential (primary) hypertension: Secondary | ICD-10-CM | POA: Diagnosis not present

## 2021-06-08 DIAGNOSIS — J9601 Acute respiratory failure with hypoxia: Secondary | ICD-10-CM | POA: Diagnosis not present

## 2021-06-08 LAB — BASIC METABOLIC PANEL
Anion gap: 10 (ref 5–15)
BUN: 37 mg/dL — ABNORMAL HIGH (ref 8–23)
CO2: 24 mmol/L (ref 22–32)
Calcium: 9.3 mg/dL (ref 8.9–10.3)
Chloride: 105 mmol/L (ref 98–111)
Creatinine, Ser: 1.32 mg/dL — ABNORMAL HIGH (ref 0.44–1.00)
GFR, Estimated: 43 mL/min — ABNORMAL LOW (ref >60)
Glucose, Bld: 106 mg/dL — ABNORMAL HIGH (ref 70–99)
Potassium: 3.3 mmol/L — ABNORMAL LOW (ref 3.5–5.1)
Sodium: 139 mmol/L (ref 135–145)

## 2021-06-08 LAB — T3: T3, Total: 80 ng/dL (ref 71–180)

## 2021-06-08 MED ORDER — METHIMAZOLE 5 MG PO TABS
5.0000 mg | ORAL_TABLET | Freq: Three times a day (TID) | ORAL | Status: DC
  Administered 2021-06-08 – 2021-06-09 (×3): 5 mg via ORAL
  Filled 2021-06-08 (×5): qty 1

## 2021-06-08 MED ORDER — DILTIAZEM HCL ER COATED BEADS 120 MG PO CP24: 120.0000 mg | ORAL_CAPSULE | Freq: Every day | ORAL | Status: DC

## 2021-06-08 MED ORDER — POTASSIUM CHLORIDE 20 MEQ PO PACK
40.0000 meq | PACK | Freq: Two times a day (BID) | ORAL | Status: AC
  Administered 2021-06-08 (×2): 40 meq via ORAL
  Filled 2021-06-08 (×2): qty 2

## 2021-06-08 MED ORDER — DILTIAZEM HCL 60 MG PO TABS
30.0000 mg | ORAL_TABLET | Freq: Four times a day (QID) | ORAL | Status: DC
  Administered 2021-06-08 – 2021-06-09 (×4): 30 mg via ORAL
  Filled 2021-06-08 (×4): qty 1

## 2021-06-08 MED ORDER — MAGNESIUM SULFATE 2 GM/50ML IV SOLN
2.0000 g | Freq: Once | INTRAVENOUS | Status: AC
  Administered 2021-06-08: 2 g via INTRAVENOUS
  Filled 2021-06-08: qty 50

## 2021-06-08 NOTE — Progress Notes (Signed)
   NAME:  Anna Marquez, MRN:  [id number], DOB:  03-30-1950, LOS: 3 ADMISSION DATE:  06/05/2021, CONSULTATION DATE: 06/05/2021 REFERRING MD: Dr. Lorin Mercy, CHIEF COMPLAINT: Hemoptysis, shortness of breath  History of Present Illness:  Patient with cough, shortness of breath Came into the hospital today for worsening shortness of breath Cough only started today with some pinkish phlegm   Had had a low-grade fever for about 3 to 4 weeks, treated with a course of antibiotics and steroids with nonresolution of symptoms   History of atrial fibrillation, on anticoagulation No history of heart failure   Does have a history of asthmatic bronchitis which was felt to be exacerbated recently and this was the reason for recent antibiotic therapy  Pertinent  Medical History   Past Medical History:  Diagnosis Date   Adenomatous colon polyp 2003   Cholelithiasis    COVID    Diverticulosis of colon    Gastric polyps    GERD (gastroesophageal reflux disease)    Helicobacter pylori (H. pylori)    Hiatal hernia    Hypertension    Mitral valve prolapse    Nephrolithiasis    Osteoarthritis    Osteopenia 03/2017   T score -1.5 FRAX 8.9% / 0.9%.  Stable from prior DEXA 2016   Persistent atrial fibrillation (HCC)    TGA (transient global amnesia)      Significant Hospital Events: Including procedures, antibiotic start and stop dates in addition to other pertinent events   CT scan of the chest reviewed showing groundglass changes-reviewed by myself  Interim History / Subjective:  Continues to feel well Has not required BiPAP Able to wean off oxygen supplementation No recurrence of hemoptysis since the first day of admission  Objective   Blood pressure 122/84, pulse (!) 102, temperature 98.6 F (37 C), temperature source Oral, resp. rate 19, height '5\' 4"'$  (1.626 m), weight 76.3 kg, SpO2 92 %.        Intake/Output Summary (Last 24 hours) at 06/08/2021 0947 Last data filed at 06/08/2021 0751 Gross  per 24 hour  Intake 1260 ml  Output 1500 ml  Net -240 ml   Filed Weights   06/05/21 0559 06/05/21 1750  Weight: 74.8 kg 76.3 kg    Examination: General: Elderly lady, does not appear to be in distress HENT: Moist oral mucosa Lungs: Clear breath sounds bilaterally Cardiovascular: S1-S2 appreciated with no murmur  abdomen: Bowel sounds appreciated Extremities: No peripheral edema Neuro: Alert and oriented x3 GU: [facility] Problem list     Assessment & Plan:   Hypoxemic respiratory failure -Improved -Off oxygen at present  Hemoptysis -Likely related to multilobar pneumonia -Complete course of Levaquin -Complete course of steroids -Prednisone may be stopped after 5 days  Atrial fibrillation -Status post ablation in the past -Chronically on Eliquis  Overall improvement in symptoms today is day 4 of 5 of Levaquin  Discharge planning  Sherrilyn Rist, MD [place] [facility] Pager: See Shea Evans

## 2021-06-08 NOTE — Progress Notes (Signed)
  Progress Note   Patient: Anna Marquez LNL:[id number] DOB: 1950/12/26 DOA: 06/05/2021     3 DOS: the patient was seen and examined on 06/08/2021 at 10:40AM      Brief hospital course: [name] is a 71 y.o. F with AF on Eliquis, HTN and d/CHF who presented with few weeks respiratory symptoms, initially treated as COPD flare, then more characterized by dyspnea on exertion.  In the ER, CTA ruled out PE, showed edema and effusions.  BNP normal.  Started on antibiotics, steroids and Pulmonary consulted.     Assessment and Plan: * Acute respiratory failure with hypoxia ([place]) Presented with hypoxia to 85% and requiring 5L O2 in the ER. Probably due to bronchopneumonia, CHF flare.   Treated with few doses furosemide, now stopped.   - Continue Levaquin day 4 of 5 - Continue prednisone day 4 of 5 - Continue pulmonary toilet - Continue PPI - Continue albuterol PRN and Flonase    CAP (community acquired pneumonia) See above  Acute on chronic diastolic CHF (congestive heart failure) ([place]) Presented with exertional dyspnea (she now says this was kind of like fatigue rather than dyspnea), wheezing, and had evidence of effusions and edema on chest imaging.  Improved with Lasix.  Probably some mild failure in setting of rapid Afib, diastolic dysfunction. Cr slightly up, will hold further lasix.  Hyperthyroidism TSH low and fT4 elevated.  T3 normal.  No symptoms of tremor, hot intolerance, weight loss, proptosis, anxiety.  Cardiac disease noted. - Follow TSI and T3 - I would favor holding off on methimazole and having endocrinology follow up, but will discuss with Cardiology  Essential hypertension - Continue nadolol - Hold lisinopril today given renal function  Persistent atrial fibrillation (HCC) - Continue nadolol, increased dose yesterday - Continue Eliquis - Consult Cardiology, regarding rhythm vs rate control strategies at this time  Hypokalemia - Supplement potassium - Keep  Mag >2 given Afib        Subjective: Feels well.  Dyspnea and fatigue resolved.  No chest pain.  No fever. No sputum.     Physical Exam: Vitals:   06/07/21 2329 06/08/21 0343 06/08/21 0720 06/08/21 1135  BP: 115/87 119/88 122/84 (!) 123/99  Pulse: 99 (!) 106 (!) 102 (!) 109  Resp: '19 17 19 18  '$ Temp: 97.8 F (36.6 C) 98.4 F (36.9 C) 98.6 F (37 C) 98.7 F (37.1 C)  TempSrc: Oral Oral Oral Oral  SpO2: 93% 90% 92% 93%  Weight:      Height:       Adult female, sitting in the edge of the bed, coloring on her tablet. Tachycardic, irregular, I do not appreciate murmurs, no peripheral edema or JVD Respiratory rate normal, lung sounds slightly diminished at the bases but good air movement overall, no wheezing or rales Attention normal, affect appropriate, judgment insight appear normal, speech fluent, moves all extremities with normal strength and coordination  Data Reviewed: Nursing notes reviewed, vital signs reviewed Patient metabolic panel shows slightly increased creatinine, electrolytes with mild hypokalemia Thyroid ultrasound shows several cyst, FNA recommended      Disposition: Status is: Inpatient         Author: Edwin Dada, MD 06/08/2021 11:42 AM  For on call review [email or web address].

## 2021-06-08 NOTE — Care Management Important Message (Signed)
Important Message  Patient Details  Name: Anna Marquez MRN: [id number] Date of Birth: December 17, 1950   Medicare Important Message Given:  Yes     Orbie Pyo 06/08/2021, 2:37 PM

## 2021-06-08 NOTE — Plan of Care (Signed)
  Problem: Education: Goal: Knowledge of disease or condition will improve Outcome: Progressing   Problem: Activity: Goal: Ability to tolerate increased activity will improve Outcome: Progressing   Problem: Respiratory: Goal: Levels of oxygenation will improve Outcome: Progressing   Problem: Activity: Goal: Ability to tolerate increased activity will improve Outcome: Progressing   Problem: Clinical Measurements: Goal: Ability to maintain a body temperature in the normal range will improve Outcome: Progressing   Problem: Respiratory: Goal: Ability to maintain a clear airway will improve Outcome: Progressing

## 2021-06-09 ENCOUNTER — Telehealth: Payer: Self-pay | Admitting: Acute Care

## 2021-06-09 DIAGNOSIS — J9601 Acute respiratory failure with hypoxia: Secondary | ICD-10-CM | POA: Diagnosis not present

## 2021-06-09 LAB — BASIC METABOLIC PANEL
Anion gap: 7 (ref 5–15)
BUN: 29 mg/dL — ABNORMAL HIGH (ref 8–23)
CO2: 23 mmol/L (ref 22–32)
Calcium: 8.8 mg/dL — ABNORMAL LOW (ref 8.9–10.3)
Chloride: 108 mmol/L (ref 98–111)
Creatinine, Ser: 1.12 mg/dL — ABNORMAL HIGH (ref 0.44–1.00)
GFR, Estimated: 52 mL/min — ABNORMAL LOW (ref >60)
Glucose, Bld: 90 mg/dL (ref 70–99)
Potassium: 4.1 mmol/L (ref 3.5–5.1)
Sodium: 138 mmol/L (ref 135–145)

## 2021-06-09 LAB — MAGNESIUM: Magnesium: 2.2 mg/dL (ref 1.7–2.4)

## 2021-06-09 LAB — THYROID STIMULATING IMMUNOGLOBULIN: Thyroid Stimulating Immunoglob: 0.14 IU/L (ref 0.00–0.55)

## 2021-06-09 MED ORDER — DILTIAZEM HCL 30 MG PO TABS: 30.0000 mg | ORAL_TABLET | Freq: Four times a day (QID) | ORAL | 3 refills | Status: DC

## 2021-06-09 MED ORDER — METHIMAZOLE 5 MG PO TABS: 5.0000 mg | ORAL_TABLET | Freq: Three times a day (TID) | ORAL | 3 refills | Status: DC

## 2021-06-09 MED ORDER — NADOLOL 40 MG PO TABS
40.0000 mg | ORAL_TABLET | Freq: Two times a day (BID) | ORAL | Status: DC
  Administered 2021-06-09: 40 mg via ORAL
  Filled 2021-06-09 (×2): qty 1

## 2021-06-09 NOTE — TOC CM/SW Note (Signed)
Spoke with patient prior to discharge. States she is aware of the follow up appointments that need to be scheduled. Anna Marquez states she plans to sign into MyChart to schedule appointments. Anna Marquez denies having any TOC needs at this time. States she lives with her daughter and a friend. Endorses she is currently waiting for her transport chair so she can go home. No TOC needs identified.    [name], MSN, RN,BSN Inpatient [facility] Case Manager [phone]

## 2021-06-09 NOTE — Discharge Summary (Signed)
Physician Discharge Summary   Patient: Anna Marquez MRN: [id number] DOB: 1950-11-02  Admit date:     06/05/2021  Discharge date: 06/09/21  Discharge Physician: Anna Marquez   PCP: Anna Nutting, DO     Recommendations at discharge:  Follow up with PCP Dr. Zigmund Marquez in 1 week Dr. Zigmund Marquez: Please check ECG and titrate diltiazem and nadolol as needed for HR control Dr. Zigmund Marquez: Please refer to Endocrinology for hyperthyroidism and manage methimazole if referral will take significant time Dr. Zigmund Marquez: Please see below re: FNA of incidentally noted thyroid nodules Follow up with Cardiology Dr. Oval Marquez in 2 weeks for HTN subspecialty management Follow up with primary Cardiology Dr. Acie Marquez or EP as needed for follow up re: recurrent Atrial fibrillation Follow up with Pulmonology in 4-6 weeks      Discharge Diagnoses: Principal Problem:   Acute respiratory failure with hypoxia ([place]) Active Problems:   Persistent atrial fibrillation ([place])   Essential hypertension   Hypokalemia   Hyperthyroidism   Acute on chronic diastolic CHF (congestive heart failure) ([place])   CAP (community acquired pneumonia)   Thyroid nodule      Hospital Course: [name] is a 71 y.o. F with pAF on Eliquis s/p ablation, HTN and dCHF who presented with few weeks respiratory symptoms, initially treated as COPD flare, then more characterized by dyspnea on exertion.  In the ER, CTA ruled out PE, showed edema and effusions.  BNP normal.  Started on antibiotics, steroids and Pulmonary consulted for ?ILD.  TSH abnormal.      * Acute respiratory failure with hypoxia (H[facility]) Probably due to bronchopneumonia, CHF flare.  She was treated with steroids, antibiotics and furosemide and weaned off O2.   Acute on chronic diastolic congestive heart failure likely due to Afib Presented with exertional dyspnea (she now says this was kind of like fatigue rather than dyspnea), wheezing, and had evidence of  effusions and edema on chest imaging.  Improved with Lasix.     Hyperthyroidism TSH low and fT4 elevated.  T3 normal.    Mild hyperthyroidism in the setting of cardiac disease.  Started on low dose methimazole.  Recommend Endocrinology follow up.    Persistent atrial fibrillation ([place]) I assume this will be hard to control until thyroid is suppressed.    Nadolol was continued.  Diltiazem was started on scheduled basis, with goal to titrate nadolol and diltiazem to HR <105 as an outpatient.   Thyroid nodules This was noted incidentally on US thyroid which was obtained in the hospital in lieu of thyroid uptake scan.  In retrospect, she has no palpable mass, and this was probably not necessary, but now that it has been done, I suspect FNA is needed for the identified nodules at some point.           The [facility] Controlled Substances Registry was reviewed for this patient prior to discharge.   Consultants: Pulmonology   Disposition: Home   DISCHARGE MEDICATION: Allergies as of 06/09/2021       Reactions   Erythromycin    Upset stomach   Nsaids Other (See Comments)   Has a history of bleeding ulcers, tolerates aspirin    Penicillins Hives, Shortness Of Breath   Did it involve swelling of the face/tongue/throat, SOB, or low BP? Yes Did it involve sudden or severe rash/hives, skin peeling, or any reaction on the inside of your mouth or nose? No Did you need to seek medical attention at a hospital or doctor's  office? No When did it last happen?      45 years If all above answers are "NO", may proceed with cephalosporin use.        Medication List     STOP taking these medications    lisinopril 40 MG tablet Commonly known as: ZESTRIL       TAKE these medications    acetaminophen 500 MG tablet Commonly known as: TYLENOL Take 1,000 mg by mouth every 6 (six) hours as needed for moderate pain or headache.   albuterol 108 (90 Base) MCG/ACT inhaler Commonly  known as: VENTOLIN HFA INHALE 1 TO 2 PUFFS INTO THE LUNGS EVERY 6 HOURS AS NEEDED FOR WHEEZING OR SHORTNESS OF BREATH. What changed: See the new instructions.   albuterol (2.5 MG/3ML) 0.083% nebulizer solution Commonly known as: PROVENTIL Take 3 mLs (2.5 mg total) by nebulization every 4 (four) hours as needed for wheezing or shortness of breath (please include nebulizer machine, hoses, and mask if needed.). What changed: reasons to take this   Biotin 10000 MCG Tabs Take 10,000 mcg by mouth daily.   diclofenac Sodium 1 % Gel Commonly known as: VOLTAREN Apply 2-4 g topically 4 (four) times daily as needed (knees,ankles and hand pain).   diltiazem 30 MG tablet Commonly known as: CARDIZEM Take 1 tablet (30 mg total) by mouth 4 (four) times daily. What changed: See the new instructions.   Eliquis 5 MG Tabs tablet Generic drug: apixaban TAKE 1 TABLET BY MOUTH 2 TIMES DAILY. What changed: how much to take   ferrous sulfate 325 (65 FE) MG tablet Take 325 mg by mouth every other day.   fluticasone 50 MCG/ACT nasal spray Commonly known as: FLONASE PLACE 1 SPRAY INTO BOTH NOSTRILS AS NEEDED FOR ALLERGIES OR RHINITIS. What changed: when to take this   furosemide 20 MG tablet Commonly known as: LASIX TAKE 1 TABLET (20 MG TOTAL) BY MOUTH DAILY AS NEEDED FOR EDEMA.   HYDROcodone bit-homatropine 5-1.5 MG/5ML syrup Commonly known as: HYCODAN Take 5 mLs by mouth every 8 (eight) hours as needed for cough.   loratadine 10 MG tablet Commonly known as: CLARITIN Take 10 mg by mouth daily.   methimazole 5 MG tablet Commonly known as: TAPAZOLE Take 1 tablet (5 mg total) by mouth 3 (three) times daily.   nadolol 40 MG tablet Commonly known as: Corgard Take 1 tablet (40 mg total) by mouth 2 (two) times daily.   pantoprazole 40 MG tablet Commonly known as: PROTONIX TAKE 1 TABLET (40 MG TOTAL) BY MOUTH DAILY.   potassium chloride 10 MEQ tablet Commonly known as: KLOR-CON TAKE 1 TABLET  BY MOUTH DAILY AS NEEDED (TAKE WITH HCTZ). What changed: See the new instructions.   PROBIOTIC PO Take 1 capsule by mouth daily.   vitamin C 1000 MG tablet Take 1,000 mg by mouth daily.   Vitamin D 50 MCG (2000 UT) tablet Take 2,000 Units by mouth daily.   vitamin E 180 MG (400 UNITS) capsule Take 400 Units by mouth daily.   ZINC CITRATE PO Take 22 mg by mouth daily.        Follow-up Information     Anna Nutting, DO. Schedule an appointment as soon as possible for a visit in 1 week(s).   Specialty: Family Medicine Contact information: [address] [place] [address] [phone]         Laurin Coder, MD. Go to.   Specialty: Pulmonary Disease Contact information: [place]  [address] [place] [address] [phone]         Nahser, Wonda Cheng, MD. Schedule an appointment as soon as possible for a visit in 2 week(s).   Specialty: Cardiology Contact information: [place] [address] [place] [address] [phone]                 Discharge Instructions     Discharge instructions   Complete by: As directed    From Dr. Loleta Books: You were admitted for trouble breathing and fatigue. Here, we suspected this was from a pneumonia (lung infection) and probably the heart (a combination of heart failure and fast heart rate)  For the Pneumonia, you were treated with antibiotics and completed the course. Follow up with Pulmonology  Over the weekend, we sent a message to Dr. Judson Roch office to schedule you a follow up appointment with them, they should reach out to you to schedule this, although you may call the number listed below to arrange it if you'd like   For the heart failure, as we talked about, this is the chronic mild "diastolic" kind, that is common in folks in their 16s and older.  Not a major concern. You were treated here with Lasix and should go back to taking it as you did before, just as  needed  For the atrial fibrillation, this will probably persist until we get the thyroid under control For now: Take your nadolol as before Take the diltiazem 30 mg four times daily As we discussed, I am comfortable if you adjust this down (take it 3 times a day or two times a day) depending on your heart rate and your symptoms.  Go see Dr. Acie Marquez  Call his office on Monday to arrange a follow up appointment, hopefully in the next 2-3 weeks   For the thyroid: Take methimazole 5 mg three times daily Go see Dr. Rodena Piety this week or next Ask him about a referral to Endocrinology Ask him about follow up for the thyroid nodules and whether those need biopsy (fine needle aspiration) or if that can wait until you see Endocrinology   Increase activity slowly   Complete by: As directed        Discharge Exam: Filed Weights   06/05/21 0559 06/05/21 1750  Weight: 74.8 kg 76.3 kg    General: Pt is alert, awake, not in acute distress Cardiovascular: Irregular, slightly tachycardic, nl S1-S2, no murmurs appreciated.   No LE edema.   Respiratory: Normal respiratory rate and rhythm.  CTAB without rales or wheezes. Abdominal: Abdomen soft and non-tender.  No distension or HSM.   Neuro/Psych: Strength symmetric in upper and lower extremities.  Judgment and insight appear normal.   Condition at discharge: good  The results of significant diagnostics from this hospitalization (including imaging, microbiology, ancillary and laboratory) are listed below for reference.   Imaging Studies: CT Angio Chest Pulmonary Embolism (PE) W or WO Contrast  Result Date: 06/05/2021 CLINICAL DATA:  Shortness of breath. EXAM: CT ANGIOGRAPHY CHEST WITH CONTRAST TECHNIQUE: Multidetector CT imaging of the chest was performed using the standard protocol during bolus administration of intravenous contrast. Multiplanar CT image reconstructions and MIPs were obtained to evaluate the vascular anatomy. RADIATION DOSE  REDUCTION: This exam was performed according to the departmental dose-optimization program which includes automated exposure control, adjustment of the mA and/or kV according to patient size and/or use of iterative reconstruction technique. CONTRAST:  135m OMNIPAQUE IOHEXOL 350 MG/ML SOLN COMPARISON:  CT scan 08/20/2015. FINDINGS: Cardiovascular: The heart is borderline enlarged. No pericardial effusion. The aorta is normal in caliber. Scattered atherosclerotic calcifications. The pulmonary arterial tree is fairly well opacified. No filling defects to suggest pulmonary embolism. Normal sized pulmonary arteries. Mediastinum/Nodes: No mediastinal or hilar mass or lymphadenopathy. Scattered hilar lymph nodes likely related to the pulmonary findings. The esophagus is grossly normal. There is a moderate-sized hiatal hernia. Enlarged thyroid gland with substernal extension, unchanged since 2017. This has been evaluated on previous imaging. (ref: J Am Coll Radiol. 2015 Feb;12(2): 143-50). Lungs/Pleura: Central pulmonary vascular congestion and diffuse but patchy asymmetric ground-glass opacity most likely pulmonary edema. There are also small bilateral pleural effusions. No focal airspace consolidation and no obvious underlying worrisome pulmonary lesions. Upper Abdomen: No significant upper abdominal findings. Musculoskeletal: No breast masses, supraclavicular or axillary adenopathy. Review of the MIP images confirms the above findings. IMPRESSION: 1. No pulmonary embolism. 2. Central pulmonary vascular congestion and diffuse but patchy asymmetric ground-glass opacity most likely pulmonary edema. 3. Small bilateral pleural effusions. 4. No mediastinal or hilar mass or adenopathy. 5. Moderate-sized hiatal hernia. 6. Aortic atherosclerosis. Aortic Atherosclerosis (ICD10-I70.0). Electronically Signed   By: Marijo Sanes M.D.   On: 06/05/2021 08:50   DG Chest Port 1 View  Result Date: 06/05/2021 CLINICAL DATA:  Shortness  of breath. EXAM: PORTABLE CHEST 1 VIEW COMPARISON:  Chest CT 02/09/2019. FINDINGS: The heart enlarged, similar to prior study but with interval development of central vascular prominence and interstitial edema in the bases as well as trace pleural effusions. Findings consistent with mild CHF or fluid overload. There are perihilar hazy opacities which could be ground-glass edema or pneumonitis. Lungs are otherwise clear of focal opacities. The mediastinal configuration is stable. There is mild aortic atherosclerosis. Thoracic cage grossly intact. IMPRESSION: Cardiomegaly with perihilar vascular congestion and lower zonal interstitial edema consistent with CHF or fluid overload, with minimal pleural effusions forming. Perihilar interstitial haziness, which could be due to ground-glass edema or pneumonitis. Electronically Signed   By: Telford Nab M.D.   On: 06/05/2021 06:41   US THYROID  Result Date: 06/07/2021 CLINICAL DATA:  Hyperthyroidism EXAM: THYROID ULTRASOUND TECHNIQUE: Ultrasound examination of the thyroid gland and adjacent soft tissues was performed. COMPARISON:  None available FINDINGS: Parenchymal Echotexture: Mildly heterogenous Isthmus: 0.9 cm Right lobe: 4.6 x 2.7 x 1.9 cm Left lobe: 7.6 x 3.0 x 3.1 cm _________________________________________________________ Estimated total number of nodules >/= 1 cm: 5 Number of spongiform nodules >/=  2 cm not described below (TR1): 0 Number of mixed cystic and solid nodules >/= 1.5 cm not described below (TR2): 0 _________________________________________________________ Nodule # 1: Location: Right; mid Maximum size: 1.1 cm; Other 2 dimensions: 0.9 x 0.9 cm Composition: cannot determine (2) Echogenicity: cannot determine (1) Shape: not taller-than-wide (0) Margins: smooth (0) Echogenic foci: peripheral calcifications (2) ACR TI-RADS total points: 5. ACR TI-RADS risk category: TR4 (4-6 points). ACR TI-RADS recommendations: *Given size (>/= 1 - 1.4 cm) and  appearance, a follow-up ultrasound in 1 year should be considered based on TI-RADS criteria. _________________________________________________________ Nodule # 2: Location: Right; inferior Maximum size: 1.1 cm; Other 2 dimensions: 0.9 x 0.7 cm Composition: solid/almost completely solid (2) Echogenicity: isoechoic (1) Shape: not taller-than-wide (0) Margins: ill-defined (0) Echogenic foci: none (0) ACR TI-RADS total points: 3. ACR TI-RADS risk category: TR3 (3 points). ACR TI-RADS recommendations: Given size (<1.4 cm) and appearance, this nodule does NOT meet TI-RADS criteria for biopsy or dedicated follow-up. _________________________________________________________ Nodule # 3: Location:  Left; mid Maximum size: 2.5 cm; Other 2 dimensions: 2.3 x 1.8 cm Composition: solid/almost completely solid (2) Echogenicity: hypoechoic (2) Shape: not taller-than-wide (0) Margins: ill-defined (0) Echogenic foci: none (0) ACR TI-RADS total points: 4. ACR TI-RADS risk category: TR4 (4-6 points). ACR TI-RADS recommendations: **Given size (>/= 1.5 cm) and appearance, fine needle aspiration of this moderately suspicious nodule should be considered based on TI-RADS criteria. _________________________________________________________ Nodule # 4: Location: Left; mid Maximum size: 1.5 cm; Other 2 dimensions: 1.4 x 1.3 cm Composition: solid/almost completely solid (2) Echogenicity: isoechoic (1) Shape: not taller-than-wide (0) Margins: ill-defined (0) Echogenic foci: none (0) ACR TI-RADS total points: 3. ACR TI-RADS risk category: TR3 (3 points). ACR TI-RADS recommendations: *Given size (>/= 1.5 - 2.4 cm) and appearance, a follow-up ultrasound in 1 year should be considered based on TI-RADS criteria. _________________________________________________________ Nodule # 5: Location: Left; inferior Maximum size: 2.6 cm; Other 2 dimensions: 2.4 x 2.2 cm Composition: solid/almost completely solid (2) Echogenicity: hypoechoic (2) Shape: not  taller-than-wide (0) Margins: ill-defined (0) Echogenic foci: none (0) ACR TI-RADS total points: 4. ACR TI-RADS risk category: TR4 (4-6 points). ACR TI-RADS recommendations: **Given size (>/= 1.5 cm) and appearance, fine needle aspiration of this moderately suspicious nodule should be considered based on TI-RADS criteria. IMPRESSION: 1. Nodule 3 (TI-RADS 4), measuring 2.5 cm, located in the mid left thyroid lobe, meets criteria for FNA. 2. Nodule 5 (TI-RADS 4), measuring 2.6 cm, located in the inferior left thyroid lobe, meets criteria for FNA. 3. Nodules 1 and 4 meet criteria for surveillance. Follow-up thyroid ultrasound should be performed in 1 year. The above is in keeping with the ACR TI-RADS recommendations - J Am Coll Radiol 2017;14:587-595. Electronically Signed   By: Miachel Roux M.D.   On: 06/07/2021 13:23    Microbiology: Results for orders placed or performed during the hospital encounter of 06/05/21  SARS Coronavirus 2 by RT PCR (hospital order, performed in [facility] hospital lab) *cepheid single result test* Anterior Nasal Swab     Status: None   Collection Time: 06/05/21  9:44 AM   Specimen: Anterior Nasal Swab  Result Value Ref Range Status   SARS Coronavirus 2 by RT PCR NEGATIVE NEGATIVE Final    Comment: (NOTE) SARS-CoV-2 target nucleic acids are NOT DETECTED.  The SARS-CoV-2 RNA is generally detectable in upper and lower respiratory specimens during the acute phase of infection. The lowest concentration of SARS-CoV-2 viral copies this assay can detect is 250 copies / mL. A negative result does not preclude SARS-CoV-2 infection and should not be used as the sole basis for treatment or other patient management decisions.  A negative result may occur with improper specimen collection / handling, submission of specimen other than nasopharyngeal swab, presence of viral mutation(s) within the areas targeted by this assay, and inadequate number of viral copies (<250 copies / mL).  A negative result must be combined with clinical observations, patient history, and epidemiological information.  Fact Sheet for Patients:   https://www.patel.info/  Fact Sheet for Healthcare Providers: https://hall.com/  This test is not yet approved or  cleared by the [place] FDA and has been authorized for detection and/or diagnosis of SARS-CoV-2 by FDA under an Emergency Use Authorization (EUA).  This EUA will remain in effect (meaning this test can be used) for the duration of the COVID-19 declaration under Section 564(b)(1) of the Act, 21 U.S.C. section 360bbb-3(b)(1), unless the authorization is terminated or revoked sooner.  Performed at [facility], [address] [place], [place] [address]   Respiratory (~20 pathogens) panel by PCR     Status: None   Collection Time: 06/05/21  9:44 AM   Specimen: Nasopharyngeal Swab; Respiratory  Result Value Ref Range Status   Adenovirus NOT DETECTED NOT DETECTED Final   Coronavirus 229E NOT DETECTED NOT DETECTED Final    Comment: (NOTE) The Coronavirus on the Respiratory Panel, DOES NOT test for the novel  Coronavirus (2019 nCoV)    Coronavirus HKU1 NOT DETECTED NOT DETECTED Final   Coronavirus NL63 NOT DETECTED NOT DETECTED Final   Coronavirus OC43 NOT DETECTED NOT DETECTED Final   Metapneumovirus NOT DETECTED NOT DETECTED Final   Rhinovirus / Enterovirus NOT DETECTED NOT DETECTED Final   Influenza A NOT DETECTED NOT DETECTED Final   Influenza B NOT DETECTED NOT DETECTED Final   Parainfluenza Virus 1 NOT DETECTED NOT DETECTED Final   Parainfluenza Virus 2 NOT DETECTED NOT DETECTED Final   Parainfluenza Virus 3 NOT DETECTED NOT DETECTED Final   Parainfluenza Virus 4 NOT DETECTED NOT DETECTED Final   Respiratory Syncytial Virus NOT DETECTED NOT DETECTED Final   Bordetella pertussis NOT DETECTED NOT DETECTED Final   Bordetella Parapertussis NOT DETECTED NOT DETECTED Final    Chlamydophila pneumoniae NOT DETECTED NOT DETECTED Final   Mycoplasma pneumoniae NOT DETECTED NOT DETECTED Final    Comment: Performed at [facility], [place]. [address] [place], [place] [address]  MRSA Next Gen by PCR, Nasal     Status: None   Collection Time: 06/05/21  6:00 PM   Specimen: Nasal Mucosa; Nasal Swab  Result Value Ref Range Status   MRSA by PCR Next Gen NOT DETECTED NOT DETECTED Final    Comment: (NOTE) The GeneXpert MRSA Assay (FDA approved for NASAL specimens only), is one component of a comprehensive MRSA colonization surveillance program. It is not intended to diagnose MRSA infection nor to guide or monitor treatment for MRSA infections. Test performance is not FDA approved in patients less than 62 years old. Performed at [facility], [place] [address] [place], [place] [address]   Expectorated Sputum Assessment w Gram Stain, Rflx to Resp Cult     Status: None   Collection Time: 06/06/21  3:45 PM   Specimen: Expectorated Sputum  Result Value Ref Range Status   Specimen Description EXPECTORATED SPUTUM  Final   Special Requests NONE  Final   Sputum evaluation   Final    Sputum specimen not acceptable for testing.  Please recollect.   Gram Stain Report Called to,Read Back By and Verified With: RN. Crissie Reese [phone] '@[phone]'$  FH Performed at [facility], [place] [address] [place], [place] [address]    Report Status 06/06/2021 FINAL  Final    Labs: CBC: Recent Labs  Lab 06/05/21 0654 06/05/21 1012 06/06/21 0132 06/07/21 0150  WBC 8.5  --  10.4 8.0  HGB 13.2 12.9 12.1 12.2  HCT 37.6 38.0 35.2* 35.8*  MCV 87.9  --  87.8 88.8  PLT 217  --  247 149   Basic Metabolic Panel: Recent Labs  Lab 06/05/21 0654 06/05/21 0820 06/05/21 1012 06/06/21 0132 06/07/21 0150 06/08/21 0214 06/09/21 0051  NA 139  --  140 138 140 139 138  K 3.3*  --  3.6 3.8 3.6 3.3* 4.1  CL 109  --   --  111 109 105 108  CO2 19*  --   --  20* '22 24 23  '$ GLUCOSE 101*  --   --  134* 149* 106* 90  BUN 14  --   --  15 21 37* 29*  CREATININE 0.93  --   --  0.97 1.12* 1.32* 1.12*  CALCIUM 9.3  --   --  9.1 9.2 9.3 8.8*  MG  --  1.7  --   --   --   --  2.2   Liver Function Tests: No results for input(s): AST, ALT, ALKPHOS, BILITOT, PROT, ALBUMIN in the last 168 hours. CBG: No results for input(s): GLUCAP in the last 168 hours.  Discharge time spent: approximately 40 minutes spent on discharge counseling, evaluation of patient on day of discharge, and coordination of discharge planning with nursing, social work, pharmacy and case management  Signed: Edwin Dada, MD [facility] 06/09/2021

## 2021-06-11 NOTE — Telephone Encounter (Signed)
Left voicemail informing patient of HFU on 06/27/2021 at 11am with Dr. Ander Slade. Appointment reminder mailed to address on filw- nothing further needed.

## 2021-06-14 ENCOUNTER — Ambulatory Visit (INDEPENDENT_AMBULATORY_CARE_PROVIDER_SITE_OTHER): Payer: PPO | Admitting: Family Medicine

## 2021-06-14 ENCOUNTER — Encounter: Payer: Self-pay | Admitting: Family Medicine

## 2021-06-14 VITALS — BP 121/88 | HR 129 | Ht 64.0 in | Wt 170.0 lb

## 2021-06-14 DIAGNOSIS — E0590 Thyrotoxicosis, unspecified without thyrotoxic crisis or storm: Secondary | ICD-10-CM | POA: Diagnosis not present

## 2021-06-14 DIAGNOSIS — I10 Essential (primary) hypertension: Secondary | ICD-10-CM

## 2021-06-14 DIAGNOSIS — J189 Pneumonia, unspecified organism: Secondary | ICD-10-CM | POA: Diagnosis not present

## 2021-06-14 DIAGNOSIS — I4819 Other persistent atrial fibrillation: Secondary | ICD-10-CM | POA: Diagnosis not present

## 2021-06-14 DIAGNOSIS — H109 Unspecified conjunctivitis: Secondary | ICD-10-CM

## 2021-06-14 DIAGNOSIS — I5033 Acute on chronic diastolic (congestive) heart failure: Secondary | ICD-10-CM | POA: Diagnosis not present

## 2021-06-14 DIAGNOSIS — E041 Nontoxic single thyroid nodule: Secondary | ICD-10-CM

## 2021-06-14 DIAGNOSIS — I4891 Unspecified atrial fibrillation: Secondary | ICD-10-CM

## 2021-06-14 MED ORDER — DILTIAZEM HCL ER COATED BEADS 180 MG PO CP24: 180.0000 mg | ORAL_CAPSULE | Freq: Every day | ORAL | 3 refills | Status: DC

## 2021-06-14 MED ORDER — ERYTHROMYCIN 5 MG/GM OP OINT
1.0000 | TOPICAL_OINTMENT | Freq: Four times a day (QID) | OPHTHALMIC | 0 refills | Status: DC
Start: 2021-06-14 — End: 2021-07-11

## 2021-06-14 NOTE — Assessment & Plan Note (Signed)
Likely related to her uncontrolled A-fib.  She did have improvement with Lasix.  She has felt better with improved rate control of her A-fib.

## 2021-06-14 NOTE — Assessment & Plan Note (Signed)
Blood pressure stable at this time.  Asked her to monitor her blood pressure at home with increase in her diltiazem.  She will report heart rate and blood pressure readings after a few days.

## 2021-06-14 NOTE — Assessment & Plan Note (Signed)
Suppressed TSH with elevated T4.  He is currently on methimazole.  We will continue at current strength.  Updated TSH and T4 today.  Multinodular goiter and she has had a couple of nodules which may need FNA.  Referral placed to endocrinology.

## 2021-06-14 NOTE — Assessment & Plan Note (Signed)
Adding erythromycin ointment.  She will let me know if worsening.

## 2021-06-14 NOTE — Patient Instructions (Signed)
Start diltiazem extended release- '180mg'$  daily.  Continue to monitor HR and blood pressure.  Send me readings every few days.    Continue methimazole at current strength.    Try erythromycin ointment for the eye   Keep specialist follow up .

## 2021-06-14 NOTE — Assessment & Plan Note (Signed)
She has completed course of antibiotics and steroids.  Improved at this time.  She does have follow-up with pulmonology.

## 2021-06-14 NOTE — Progress Notes (Signed)
Anna Marquez - 71 y.o. female MRN [id number]  Date of birth: 09/26/1950  Subjective Chief Complaint  Patient presents with   Hospitalization Follow-up    HPI Anna Marquez is a 71 year old female here today for hospital follow-up.  She was hospitalized from 06/05/2021 to 06/09/2021.  Initially presented to the ED after a few weeks of respiratory symptoms.  Initially was treated COPD flare then developed more exertional dyspnea.  She had CTA with no PE but this did show edema and effusions.  Her BNP was normal.  She was treated for pneumonia with antibiotics and steroids.  Pulmonology was consulted during hospitalization for possible interstitial lung disease.  Additionally her heart rate was elevated as her A-fib was not rate controlled.  She continued to have some heart failure symptoms and her dyspnea did improve with Lasix during her hospitalization.  She was titrated on diltiazem in addition to her nadolol for rate control.  Since her discharge her heart rate has remained elevated.  She is taking Cardizem immediate release 4 times a day and would like to try to switch to a controlled release medication for ease of dosing.  She does have a follow-up with cardiology next week.  Her TSH was found to be suppressed with elevated free T4.  Methimazole was started at 5 mg.  She states she may feel fatigued after taking this.  She does not currently have an endocrinologist.  She did have some nodules on thyroid ultrasound consistent with multinodular goiter.  She does have 2 nodules that meet criteria for FNA.  Today she reports she is feeling much better.  She does have some occasional wheezing.  She does also feel some fatigue.  She does have follow-up scheduled with pulmonology as well.  She does continue to have some eye irritation of the left eye.  She had this prior to going into the hospital and is taking moxifloxacin drops.  These were not continued during her hospitalization.  ROS:  A comprehensive ROS  was completed and negative except as noted per HPI  Allergies  Allergen Reactions   Erythromycin     Upset stomach   Nsaids Other (See Comments)    Has a history of bleeding ulcers, tolerates aspirin    Penicillins Hives and Shortness Of Breath    Did it involve swelling of the face/tongue/throat, SOB, or low BP? Yes Did it involve sudden or severe rash/hives, skin peeling, or any reaction on the inside of your mouth or nose? No Did you need to seek medical attention at a hospital or doctor's office? No When did it last happen?      45 years If all above answers are "NO", may proceed with cephalosporin use.     Past Medical History:  Diagnosis Date   Adenomatous colon polyp 2003   Cholelithiasis    COVID    Diverticulosis of colon    Gastric polyps    GERD (gastroesophageal reflux disease)    Helicobacter pylori (H. pylori)    Hiatal hernia    Hypertension    Mitral valve prolapse    Nephrolithiasis    Osteoarthritis    Osteopenia 03/2017   T score -1.5 FRAX 8.9% / 0.9%.  Stable from prior DEXA 2016   Persistent atrial fibrillation (HCC)    TGA (transient global amnesia)     Past Surgical History:  Procedure Laterality Date   ANTERIOR AND POSTERIOR REPAIR N/A 06/14/2014   Procedure: ANTERIOR (CYSTOCELE) AND POSTERIOR REPAIR (RECTOCELE);  Surgeon: Christia Reading  Corey Skains, MD;  Location: [facility];  Service: Gynecology;  Laterality: N/A;   ATRIAL FIBRILLATION ABLATION N/A 02/11/2019   Procedure: ATRIAL FIBRILLATION ABLATION;  Surgeon: Thompson Grayer, MD;  Location: [facility];  Service: Cardiovascular;  Laterality: N/A;   CARDIOVERSION N/A 01/15/2019   Procedure: CARDIOVERSION;  Surgeon: Fay Records, MD;  Location: [facility];  Service: Cardiovascular;  Laterality: N/A;   CHOLECYSTECTOMY     kidney stone removal     x2   VAGINAL HYSTERECTOMY N/A 06/14/2014   Procedure: HYSTERECTOMY VAGINAL;  Surgeon: Anastasio Auerbach, MD;  Location: [facility];  Service: Gynecology;   Laterality: N/A;    Social History   Socioeconomic History   Marital status: Divorced    Spouse name: Not on file   Number of children: 2   Years of education: 12th grade   Highest education level: High school graduate  Occupational History   Occupation: Retired,  Tobacco Use   Smoking status: Never   Smokeless tobacco: Never  Vaping Use   Vaping Use: Never used  Substance and Sexual Activity   Alcohol use: Never    Alcohol/week: 0.0 standard drinks of alcohol   Drug use: No   Sexual activity: Yes    Birth control/protection: Post-menopausal    Comment: 1st intercourse 71 yo-Fewer than 5 partners  Other Topics Concern   Not on file  Social History Narrative   Lives along with her dog. She enjoys croteching, crafts, making wreaths, reading and gardening in her free time.    Social Determinants of Health   Financial Resource Strain: Low Risk  (03/26/2021)   Overall Financial Resource Strain (CARDIA)    Difficulty of Paying Living Expenses: Not hard at all  Food Insecurity: No Food Insecurity (03/26/2021)   Hunger Vital Sign    Worried About Running Out of Food in the Last Year: Never true    Ran Out of Food in the Last Year: Never true  Transportation Needs: No Transportation Needs (03/26/2021)   PRAPARE - Hydrologist (Medical): No    Lack of Transportation (Non-Medical): No  Physical Activity: Sufficiently Active (03/26/2021)   Exercise Vital Sign    Days of Exercise per Week: 3 days    Minutes of Exercise per Session: 120 min  Stress: No Stress Concern Present (03/26/2021)   [place]    Feeling of Stress : Not at all  Social Connections: Moderately Isolated (03/26/2021)   Social Connection and Isolation Panel [NHANES]    Frequency of Communication with Friends and Family: More than three times a week    Frequency of Social Gatherings with Friends and Family: More than three  times a week    Attends Religious Services: More than 4 times per year    Active Member of [employer] or Organizations: No    Attends Archivist Meetings: Never    Marital Status: Divorced    Family History  Problem Relation Age of Onset   Cancer Father        prostate   Breast cancer Paternal Aunt 37   Diabetes Maternal Grandmother    Heart disease Maternal Grandmother    Heart disease Maternal Grandfather    Melanoma Paternal Aunt    Neuropathy Mother    Congestive Heart Failure Mother    Thyroid disease Daughter     Health Maintenance  Topic Date Due   Zoster Vaccines- Shingrix (1 of 2)  06/26/2021 (Originally 02/02/1969)   COVID-19 Vaccine (3 - Pfizer risk series) 09/13/2021 (Originally 04/21/2019)   INFLUENZA VACCINE  08/07/2021   MAMMOGRAM  07/27/2022   COLONOSCOPY (Pts 45-34yr Insurance coverage will need to be confirmed)  03/13/2023   TETANUS/TDAP  10/29/2027   Pneumonia Vaccine 71 Years old  Completed   DEXA SCAN  Completed   Hepatitis C Screening  Completed   HPV VACCINES  Aged Out     ----------------------------------------------------------------------------------------------------------------------------------------------------------------------------------------------------------------- Physical Exam BP 121/88 (BP Location: Right Arm, Patient Position: Sitting, Cuff Size: Large)   Pulse (!) 129   Ht '5\' 4"'$  (1.626 m)   Wt 170 lb (77.1 kg)   SpO2 95%   BMI 29.18 kg/m   Physical Exam Constitutional:      Appearance: Normal appearance.  Eyes:     General: No scleral icterus. Cardiovascular:     Rate and Rhythm: Normal rate and regular rhythm.  Pulmonary:     Effort: Pulmonary effort is normal.     Breath sounds: Normal breath sounds.  Musculoskeletal:     Cervical back: Neck supple.  Neurological:     General: No focal deficit present.     Mental Status: She is alert.  Psychiatric:        Mood and Affect: Mood normal.        Behavior:  Behavior normal.    EKG: Atrial flutter  without new signs of ischemia. ------------------------------------------------------------------------------------------------------------------------------------------------------------------------------------------------------------------- Assessment and Plan  Acute on chronic diastolic CHF (congestive heart failure) (HCC) Likely related to her uncontrolled A-fib.  She did have improvement with Lasix.  She has felt better with improved rate control of her A-fib.  Atrial fibrillation with RVR ([place] She continues on nadolol 40 mg.  Heart rate remains elevated with current dose of diltiazem.  Blood pressure stable at this time.  We will continue titration of diltiazem.  Switching to Cardizem and CD at 180 mg daily.  Her previous cumulative dose is 120 mg daily.  Essential hypertension Blood pressure stable at this time.  Asked her to monitor her blood pressure at home with increase in her diltiazem.  She will report heart rate and blood pressure readings after a few days.  CAP (community acquired pneumonia) She has completed course of antibiotics and steroids.  Improved at this time.  She does have follow-up with pulmonology.  Hyperthyroidism Suppressed TSH with elevated T4.  He is currently on methimazole.  We will continue at current strength.  Updated TSH and T4 today.  Multinodular goiter and she has had a couple of nodules which may need FNA.  Referral placed to endocrinology.  Conjunctivitis Adding erythromycin ointment.  She will let me know if worsening.   Meds ordered this encounter  Medications   diltiazem (CARDIZEM CD) 180 MG 24 hr capsule    Sig: Take 1 capsule (180 mg total) by mouth daily.    Dispense:  30 capsule    Refill:  3   erythromycin ophthalmic ointment    Sig: Place 1 application. into the left eye 4 (four) times daily.    Dispense:  3.5 g    Refill:  0    No follow-ups on file.    This visit occurred during  the SARS-CoV-2 public health emergency.  Safety protocols were in place, including screening questions prior to the visit, additional usage of staff PPE, and extensive cleaning of exam room while observing appropriate contact time as indicated for disinfecting solutions.

## 2021-06-14 NOTE — Assessment & Plan Note (Signed)
She continues on nadolol 40 mg.  Heart rate remains elevated with current dose of diltiazem.  Blood pressure stable at this time.  We will continue titration of diltiazem.  Switching to Cardizem and CD at 180 mg daily.  Her previous cumulative dose is 120 mg daily.

## 2021-06-15 LAB — T4, FREE: Free T4: 1.8 ng/dL (ref 0.8–1.8)

## 2021-06-15 LAB — CBC WITH DIFFERENTIAL/PLATELET
Absolute Monocytes: 567 cells/uL (ref 200–950)
Basophils Absolute: 49 cells/uL (ref 0–200)
Basophils Relative: 0.6 %
Eosinophils Absolute: 49 cells/uL (ref 15–500)
Eosinophils Relative: 0.6 %
HCT: 39.8 % (ref 35.0–45.0)
Hemoglobin: 13.4 g/dL (ref 11.7–15.5)
Lymphs Abs: 2479 cells/uL (ref 850–3900)
MCH: 30.5 pg (ref 27.0–33.0)
MCHC: 33.7 g/dL (ref 32.0–36.0)
MCV: 90.5 fL (ref 80.0–100.0)
MPV: 9.7 fL (ref 7.5–12.5)
Monocytes Relative: 7 %
Neutro Abs: 4957 cells/uL (ref 1500–7800)
Neutrophils Relative %: 61.2 %
Platelets: 249 10*3/uL (ref 140–400)
RBC: 4.4 10*6/uL (ref 3.80–5.10)
RDW: 13.2 % (ref 11.0–15.0)
Total Lymphocyte: 30.6 %
WBC: 8.1 10*3/uL (ref 3.8–10.8)

## 2021-06-15 LAB — COMPLETE METABOLIC PANEL WITH GFR
AG Ratio: 1.7 (calc) (ref 1.0–2.5)
ALT: 11 U/L (ref 6–29)
AST: 11 U/L (ref 10–35)
Albumin: 4.1 g/dL (ref 3.6–5.1)
Alkaline phosphatase (APISO): 55 U/L (ref 37–153)
BUN/Creatinine Ratio: 13 (calc) (ref 6–22)
BUN: 13 mg/dL (ref 7–25)
CO2: 25 mmol/L (ref 20–32)
Calcium: 9.3 mg/dL (ref 8.6–10.4)
Chloride: 104 mmol/L (ref 98–110)
Creat: 1.01 mg/dL — ABNORMAL HIGH (ref 0.60–1.00)
Globulin: 2.4 g/dL (calc) (ref 1.9–3.7)
Glucose, Bld: 91 mg/dL (ref 65–99)
Potassium: 3.9 mmol/L (ref 3.5–5.3)
Sodium: 140 mmol/L (ref 135–146)
Total Bilirubin: 1.2 mg/dL (ref 0.2–1.2)
Total Protein: 6.5 g/dL (ref 6.1–8.1)
eGFR: 59 mL/min/{1.73_m2} — ABNORMAL LOW (ref >=60)

## 2021-06-15 LAB — TSH: TSH: 0.08 mIU/L — ABNORMAL LOW (ref 0.40–4.50)

## 2021-06-18 ENCOUNTER — Ambulatory Visit (HOSPITAL_BASED_OUTPATIENT_CLINIC_OR_DEPARTMENT_OTHER): Payer: PPO | Admitting: Cardiovascular Disease

## 2021-06-18 NOTE — Progress Notes (Incomplete)
Advanced Hypertension Clinic Initial Assessment:    Date:  06/18/2021   ID:  Anna Marquez, DOB 1950/04/02, MRN [id number]  PCP:  [name], DO  Cardiologist:  Mertie Moores, MD  Nephrologist:  Referring MD: Shirley Friar*   CC: Hypertension  History of Present Illness:    Anna Marquez is a 71 y.o. female with a hx of hypertension, *** here to establish care in the Advanced Hypertension Clinic.   Today,  *** denies any palpitations, chest pain, shortness of breath, or peripheral edema. No lightheadedness, headaches, syncope, orthopnea, or PND.  (+)  Previous antihypertensives:   Secondary Causes of Hypertension  Medications/Herbal: OCP, steroids, stimulants, antidepressants, weight loss medication, immune suppressants, NSAIDs, sympathomimetics, alcohol, caffeine, licorice, ginseng, St. John's wort, chemo  Sleep Apnea Renal artery stenosis Hyperaldosteronism Hyper/hypothyroidism Pheochromocytoma: palpitations, tachycardia, headache, diaphoresis (plasma metanephrines) Cushing's syndrome: Cushingoid facies, central obesity, proximal muscle weakness, and ecchymoses, adrenal incidentaloma (cortisol) Coarctation of the aorta  Past Medical History:  Diagnosis Date   Adenomatous colon polyp 2003   Cholelithiasis    COVID    Diverticulosis of colon    Gastric polyps    GERD (gastroesophageal reflux disease)    Helicobacter pylori (H. pylori)    Hiatal hernia    Hypertension    Mitral valve prolapse    Nephrolithiasis    Osteoarthritis    Osteopenia 03/2017   T score -1.5 FRAX 8.9% / 0.9%.  Stable from prior DEXA 2016   Persistent atrial fibrillation (HCC)    TGA (transient global amnesia)     Past Surgical History:  Procedure Laterality Date   ANTERIOR AND POSTERIOR REPAIR N/A 06/14/2014   Procedure: ANTERIOR (CYSTOCELE) AND POSTERIOR REPAIR (RECTOCELE);  Surgeon: Anastasio Auerbach, MD;  Location: [facility];  Service: Gynecology;  Laterality: N/A;    ATRIAL FIBRILLATION ABLATION N/A 02/11/2019   Procedure: ATRIAL FIBRILLATION ABLATION;  Surgeon: Thompson Grayer, MD;  Location: [facility];  Service: Cardiovascular;  Laterality: N/A;   CARDIOVERSION N/A 01/15/2019   Procedure: CARDIOVERSION;  Surgeon: Fay Records, MD;  Location: [facility];  Service: Cardiovascular;  Laterality: N/A;   CHOLECYSTECTOMY     kidney stone removal     x2   VAGINAL HYSTERECTOMY N/A 06/14/2014   Procedure: HYSTERECTOMY VAGINAL;  Surgeon: Anastasio Auerbach, MD;  Location: [facility];  Service: Gynecology;  Laterality: N/A;    Current Medications: No outpatient medications have been marked as taking for the 06/19/21 encounter (Appointment) with Skeet Latch, MD.     Allergies:   Erythromycin, Nsaids, and Penicillins   Social History   Socioeconomic History   Marital status: Divorced    Spouse name: Not on file   Number of children: 2   Years of education: 12th grade   Highest education level: High school graduate  Occupational History   Occupation: Retired,  Tobacco Use   Smoking status: Never   Smokeless tobacco: Never  Vaping Use   Vaping Use: Never used  Substance and Sexual Activity   Alcohol use: Never    Alcohol/week: 0.0 standard drinks of alcohol   Drug use: No   Sexual activity: Yes    Birth control/protection: Post-menopausal    Comment: 1st intercourse 71 yo-Fewer than 5 partners  Other Topics Concern   Not on file  Social History Narrative   Lives along with her dog. She enjoys croteching, crafts, making wreaths, reading and gardening in her free time.    Social Determinants of Health  Financial Resource Strain: Low Risk  (03/26/2021)   Overall Financial Resource Strain (CARDIA)    Difficulty of Paying Living Expenses: Not hard at all  Food Insecurity: No Food Insecurity (03/26/2021)   Hunger Vital Sign    Worried About Running Out of Food in the Last Year: Never true    Ran Out of Food in the Last Year: Never true   Transportation Needs: No Transportation Needs (03/26/2021)   PRAPARE - Hydrologist (Medical): No    Lack of Transportation (Non-Medical): No  Physical Activity: Sufficiently Active (03/26/2021)   Exercise Vital Sign    Days of Exercise per Week: 3 days    Minutes of Exercise per Session: 120 min  Stress: No Stress Concern Present (03/26/2021)   [place]    Feeling of Stress : Not at all  Social Connections: Moderately Isolated (03/26/2021)   Social Connection and Isolation Panel [NHANES]    Frequency of Communication with Friends and Family: More than three times a week    Frequency of Social Gatherings with Friends and Family: More than three times a week    Attends Religious Services: More than 4 times per year    Active Member of [employer] or Organizations: No    Attends Archivist Meetings: Never    Marital Status: Divorced     Family History: The patient's family history includes Breast cancer (age of onset: 16) in her paternal aunt; Cancer in her father; Congestive Heart Failure in her mother; Diabetes in her maternal grandmother; Heart disease in her maternal grandfather and maternal grandmother; Melanoma in her paternal aunt; Neuropathy in her mother; Thyroid disease in her daughter.  ROS:   Please see the history of present illness.     All other systems reviewed and are negative.  EKGs/Labs/Other Studies Reviewed:    ***  EKG:   : Sinus ***. Rate *** bpm.  Recent Labs: 06/05/2021: B Natriuretic Peptide 81.9 06/09/2021: Magnesium 2.2 06/14/2021: ALT 11; BUN 13; Creat 1.01; Hemoglobin 13.4; Platelets 249; Potassium 3.9; Sodium 140; TSH 0.08   Recent Lipid Panel    Component Value Date/Time   CHOL 179 08/07/2020 0000   TRIG 396 (H) 08/07/2020 0000   HDL 46 (L) 08/07/2020 0000   CHOLHDL 3.9 08/07/2020 0000   VLDL 51.8 (H) 11/04/2018 1128   LDLCALC 80 08/07/2020 0000    LDLDIRECT 100.0 11/04/2018 1128    Physical Exam:    VS:  There were no vitals taken for this visit. , BMI There is no height or weight on file to calculate BMI. GENERAL:  Well appearing HEENT: Pupils equal round and reactive, fundi not visualized, oral mucosa unremarkable NECK:  No jugular venous distention, waveform within normal limits, carotid upstroke brisk and symmetric, no bruits, no thyromegaly LYMPHATICS:  No cervical adenopathy LUNGS:  Clear to auscultation bilaterally HEART:  RRR.  PMI not displaced or sustained,S1 and S2 within normal limits, no S3, no S4, no clicks, no rubs, *** murmurs ABD:  Flat, positive bowel sounds normal in frequency in pitch, no bruits, no rebound, no guarding, no midline pulsatile mass, no hepatomegaly, no splenomegaly EXT:  2 plus pulses throughout, no edema, no cyanosis no clubbing SKIN:  No rashes no nodules NEURO:  Cranial nerves II through XII grossly intact, motor grossly intact throughout PSYCH:  Cognitively intact, oriented to person place and time   ASSESSMENT/PLAN:    No problem-specific Assessment & Plan  notes found for this encounter.   Screening for Secondary Hypertension: { Click here to document screening for secondary causes of HTN  :[id number]}    Relevant Labs/Studies:    Latest Ref Rng & Units 06/14/2021   12:00 AM 06/09/2021   12:51 AM 06/08/2021    2:14 AM  Basic Labs  Sodium 135 - 146 mmol/L 140  138  139   Potassium 3.5 - 5.3 mmol/L 3.9  4.1  3.3   Creatinine 0.60 - 1.00 mg/dL 1.01  1.12  1.32        Latest Ref Rng & Units 06/14/2021   12:00 AM 06/05/2021   10:19 AM  Thyroid   TSH 0.40 - 4.50 mIU/L 0.08  0.245                     she consents to be monitored in our remote patient monitoring program through [facility].  she will track his blood pressure twice daily and understands that these trends will help [place] to adjust her medications as needed prior to his next appointment.  she *** interested in enrolling in the  PREP exercise and nutrition program through the [facility].     Disposition:   *** FU with APP/PharmD in 1 month for the next 3 months.   FU with Tiffany C. Oval Linsey, MD, [facility] in 4 months.   Medication Adjustments/Labs and Tests Ordered: Current medicines are reviewed at length with the patient today.  Concerns regarding medicines are outlined above.   No orders of the defined types were placed in this encounter.  No orders of the defined types were placed in this encounter.   I,Mathew Stumpf,acting as a Education administrator for Skeet Latch, MD.,have documented all relevant documentation on the behalf of Skeet Latch, MD,as directed by  Skeet Latch, MD while in the presence of Skeet Latch, MD.  ***  Signed, Madelin Rear  06/18/2021 10:28 AM    [place]

## 2021-06-19 ENCOUNTER — Encounter: Payer: Self-pay | Admitting: Cardiovascular Disease

## 2021-06-19 ENCOUNTER — Ambulatory Visit: Payer: PPO | Admitting: Cardiovascular Disease

## 2021-06-19 ENCOUNTER — Ambulatory Visit (HOSPITAL_BASED_OUTPATIENT_CLINIC_OR_DEPARTMENT_OTHER): Payer: PPO | Admitting: Cardiovascular Disease

## 2021-06-19 ENCOUNTER — Encounter (HOSPITAL_BASED_OUTPATIENT_CLINIC_OR_DEPARTMENT_OTHER): Payer: Self-pay

## 2021-06-19 VITALS — BP 126/72 | HR 93 | Ht 64.0 in | Wt 171.6 lb

## 2021-06-19 DIAGNOSIS — I480 Paroxysmal atrial fibrillation: Secondary | ICD-10-CM | POA: Diagnosis not present

## 2021-06-19 DIAGNOSIS — I10 Essential (primary) hypertension: Secondary | ICD-10-CM | POA: Diagnosis not present

## 2021-06-19 NOTE — Patient Instructions (Signed)
Medication Instructions:  Your physician recommends that you continue on your current medications as directed. Please refer to the Current Medication list given to you today.  *If you need a refill on your cardiac medications before your next appointment, please call your pharmacy*   Lab Work: NONE If you have labs (blood work) drawn today and your tests are completely normal, you will receive your results only by: [place] (if you have MyChart) OR A paper copy in the mail If you have any lab test that is abnormal or we need to change your treatment, we will call you to review the results.   Testing/Procedures: NONE   Follow-Up: At [facility], you and your health needs are our priority.  As part of our continuing mission to provide you with exceptional heart care, we have created designated Provider Care Teams.  These Care Teams include your primary Cardiologist (physician) and Advanced Practice Providers (APPs -  Physician Assistants and Nurse Practitioners) who all work together to provide you with the care you need, when you need it.  Your next appointment:   6 month(s)  The format for your next appointment:   In Person  Provider:   Christen Bame or Richardson Dopp, [place] {     Important Information About Sugar

## 2021-06-19 NOTE — Progress Notes (Signed)
Anna Marquez Date of Birth  09-23-1950       [facility] [address], [address], [place], [place]  [address]   [place], [place]  [address] [phone]     [phone]   Fax  [phone]    Fax [phone]  Problem List: 1. Paroxysmal atrial fibrillation - documented Dec. 2013.   CHADS2VASC = 3 ( female, age, HTN)  -  2. Hypertension -    3.  Mitral Valve prolapse 4. Asthma 5, GERD   Notes from 2014: Anna Marquez is a 71 year old female with a history of mitral valve prolapse and hypertension in the past. She recently presented to the hospital with palpitations.  She woke up with palpitations.  She drinks alcohol only very rarely.  She was found to have atrial fibrillation with a rapid ventricular response. She taken an extra Corgard prior to coming to the emergency room. She converted to sinus rhythm while in the emergency room. Followup EKG revealed normal sinus rhythm.  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 She tries to exercise on a daily basis.  She has had occasional palpitations that sound like PVCs .    April 27, 2012:  Anna Marquez is doing well.  No palpitations.  Exercising regularly.  No complaints  April 03, 2016:  Anna Marquez was recently admitted to the hospital with chest pain. She has a history of paroxysmal atrial fibrillation, hypertension, mitral valve prolapse, asthma, and gastroesophageal reflux disease.  She had a treadmill status which was negative for ischemia. She has normal left ventricular systolic function by echo.  She walks regularly .  Has had some CP that she thinks is reflux.   Is taking PPI   Is retired from [employer]   May 23, 2017: Anna Marquez is seen today for follow-up of her paroxysmal atrial for ablation, hypertension, mitral valve prolapse, and asthma.  She was seen last year. Has some palpitations.  Last for a few minutes. Also has some HR  irregularities that last for several hours.  These typically get better if she takes another Corgard.   Aug. 24, 2021:  Has hx of PAF . Had Afib ablation Feb. 3, ( [facility])  Echo showed normal left ventricular systolic function.  She has mild mitral valve prolapse with mild to moderate mitral regurgitation. Is feeling better .  Has some right thigh cranping  Distal Pulses are great  Suggested that she might be volume depleted or have low potassium  She needs to have a renogram to look for scarring in her ureters. If she needs to have subsequent urologic surgery, it will be okay for her to hold her Eliquis for 2 to 3 days prior to the procedure.  She is at low risk for the upcoming procedure if needed.  She is off tikosyn now .   Doing great.    Oct. 3, 2022 Anna Marquez is seen for follow up of her MVP and PAF. Has occasional palpitations  BP is slightly elevated.  May be eating a bit more salt than usual  Has been eating out more often  Some ayptical CP  These may last an hour Has a hiatal hernia  The pains do not occur with walking .  Labs from Aug. Show very elevated trig level - 396. She admits that she eats lots of carbohydrates including bread with each meal, lots of rice, pasta,  potatoes.  June 19, 2021: Anna Marquez is seen today for a follow-up visit She was admitted in late May with acute respiratory failure.  It was originally treated as a COPD flare.  She was later thought to have pneumonia.  She was found to have mild hyperthyroidism.  She had atrial fibrillation vs. Accellerated junctonal rhythm during the hospitalization. Had an afib ablation 2 years ago with [facility]   Has a hx of seasonal asthma  Hx of HTN  -   BP was 160 / 100  when she saw Oda Kilts in March. BP is much better controlled today .   Echocardiogram performed in the emergency room when she was admitted was reported to have no significant changes from her previous echocardiogram in January,  2023.  Echocardiogram from January, 2023 shows normal left ventricular systolic function.  She has mild left ventricular hypertrophy with grade 1 diastolic dysfunction.  Right ventricle is mildly enlarged.  Left atrium is moderately enlarged.  Current Outpatient Medications on File Prior to Visit  Medication Sig Dispense Refill   acetaminophen (TYLENOL) 500 MG tablet Take 1,000 mg by mouth every 6 (six) hours as needed for moderate pain or headache.     albuterol (PROVENTIL) (2.5 MG/3ML) 0.083% nebulizer solution Take 3 mLs (2.5 mg total) by nebulization every 4 (four) hours as needed for wheezing or shortness of breath (please include nebulizer machine, hoses, and mask if needed.). 30 mL 0   albuterol (VENTOLIN HFA) 108 (90 Base) MCG/ACT inhaler INHALE 1 TO 2 PUFFS INTO THE LUNGS EVERY 6 HOURS AS NEEDED FOR WHEEZING OR SHORTNESS OF BREATH. 8.5 g 3   Ascorbic Acid (VITAMIN C) 1000 MG tablet Take 1,000 mg by mouth daily.     Cholecalciferol (VITAMIN D) 50 MCG (2000 UT) tablet Take 2,000 Units by mouth daily.     diclofenac Sodium (VOLTAREN) 1 % GEL Apply 2-4 g topically 4 (four) times daily as needed (knees,ankles and hand pain).     diltiazem (CARDIZEM CD) 180 MG 24 hr capsule Take 1 capsule (180 mg total) by mouth daily. 30 capsule 3   ELIQUIS 5 MG TABS tablet TAKE 1 TABLET BY MOUTH 2 TIMES DAILY. 60 tablet 5   erythromycin ophthalmic ointment Place 1 application. into the left eye 4 (four) times daily. 3.5 g 0   ferrous sulfate 325 (65 FE) MG tablet Take 325 mg by mouth every other day.     fluticasone (FLONASE) 50 MCG/ACT nasal spray PLACE 1 SPRAY INTO BOTH NOSTRILS AS NEEDED FOR ALLERGIES OR RHINITIS. 16 g 6   furosemide (LASIX) 20 MG tablet TAKE 1 TABLET (20 MG TOTAL) BY MOUTH DAILY AS NEEDED FOR EDEMA. 90 tablet 3   loratadine (CLARITIN) 10 MG tablet Take 10 mg by mouth daily.      methimazole (TAPAZOLE) 5 MG tablet Take 1 tablet (5 mg total) by mouth 3 (three) times daily. 30 tablet 3    nadolol (CORGARD) 40 MG tablet Take 1 tablet (40 mg total) by mouth 2 (two) times daily. 180 tablet 3   pantoprazole (PROTONIX) 40 MG tablet TAKE 1 TABLET (40 MG TOTAL) BY MOUTH DAILY. 90 tablet 4   potassium chloride (KLOR-CON) 10 MEQ tablet TAKE 1 TABLET BY MOUTH DAILY AS NEEDED (TAKE WITH HCTZ). 90 tablet 3   Probiotic Product (PROBIOTIC PO) Take 1 capsule by mouth daily.     vitamin E 180 MG (400 UNITS) capsule Take 400 Units by mouth daily.     ZINC CITRATE PO  Take 22 mg by mouth daily.     No current facility-administered medications on file prior to visit.    Allergies  Allergen Reactions   Erythromycin     Upset stomach   Nsaids Other (See Comments)    Has a history of bleeding ulcers, tolerates aspirin    Penicillins Hives and Shortness Of Breath    Did it involve swelling of the face/tongue/throat, SOB, or low BP? Yes Did it involve sudden or severe rash/hives, skin peeling, or any reaction on the inside of your mouth or nose? No Did you need to seek medical attention at a hospital or doctor's office? No When did it last happen?      45 years If all above answers are "NO", may proceed with cephalosporin use.     Past Medical History:  Diagnosis Date   Adenomatous colon polyp 2003   Cholelithiasis    COVID    Diverticulosis of colon    Gastric polyps    GERD (gastroesophageal reflux disease)    Helicobacter pylori (H. pylori)    Hiatal hernia    Hypertension    Mitral valve prolapse    Nephrolithiasis    Osteoarthritis    Osteopenia 03/2017   T score -1.5 FRAX 8.9% / 0.9%.  Stable from prior DEXA 2016   Persistent atrial fibrillation (HCC)    TGA (transient global amnesia)     Past Surgical History:  Procedure Laterality Date   ANTERIOR AND POSTERIOR REPAIR N/A 06/14/2014   Procedure: ANTERIOR (CYSTOCELE) AND POSTERIOR REPAIR (RECTOCELE);  Surgeon: Anastasio Auerbach, MD;  Location: [facility];  Service: Gynecology;  Laterality: N/A;   ATRIAL FIBRILLATION ABLATION  N/A 02/11/2019   Procedure: ATRIAL FIBRILLATION ABLATION;  Surgeon: Thompson Grayer, MD;  Location: [facility];  Service: Cardiovascular;  Laterality: N/A;   CARDIOVERSION N/A 01/15/2019   Procedure: CARDIOVERSION;  Surgeon: Fay Records, MD;  Location: [facility];  Service: Cardiovascular;  Laterality: N/A;   CHOLECYSTECTOMY     kidney stone removal     x2   VAGINAL HYSTERECTOMY N/A 06/14/2014   Procedure: HYSTERECTOMY VAGINAL;  Surgeon: Anastasio Auerbach, MD;  Location: [facility];  Service: Gynecology;  Laterality: N/A;    Social History   Tobacco Use  Smoking Status Never  Smokeless Tobacco Never    Social History   Substance and Sexual Activity  Alcohol Use Never   Alcohol/week: 0.0 standard drinks of alcohol    Family History  Problem Relation Age of Onset   Cancer Father        prostate   Breast cancer Paternal Aunt 26   Diabetes Maternal Grandmother    Heart disease Maternal Grandmother    Heart disease Maternal Grandfather    Melanoma Paternal Aunt    Neuropathy Mother    Congestive Heart Failure Mother    Thyroid disease Daughter     Reviw of Systems:  Reviewed in the HPI.  All other systems are negative.   Physical Exam: Blood pressure 126/72, pulse 93, height '5\' 4"'$  (1.626 m), weight 171 lb 9.6 oz (77.8 kg), SpO2 98 %.  GEN:  Well nourished, well developed in no acute distress HEENT: Normal NECK: No JVD; No carotid bruits LYMPHATICS: No lymphadenopathy CARDIAC: RRR , no murmurs, rubs, gallops RESPIRATORY:  Clear to auscultation without rales, wheezing or rhonchi  ABDOMEN: Soft, non-tender, non-distended MUSCULOSKELETAL:  No edema; No deformity  SKIN: Warm and dry NEUROLOGIC:  Alert and oriented x 3   ECG:  Assessment / Plan:   1. Paroxysmal atrial fibrillation - documented Dec. 2013.   CHADS2VASC = 3 ( female, age, HTN)  -      She is status post A. fib ablation.    Cont meds.  Is back in NSR   2. Hypertension -  BP is well controlled.   Cont dilt 160, corgard,  We discussed changing the corgard to metoprolol.   Will leave as is and continue to discuss at a later time    3.  Hyperlipidemia:    4.  Hyperthyroidism:  on methimazole   Will ask  her follow up with Dr. Oval Linsey upon my retirement  if ok with Dr. Ginette Pitman, MD  06/19/2021 10:05 AM    [facility] [place],  [place], [place]  [address] Pager [phone] Phone: [phone]; Fax: [phone]

## 2021-06-20 DIAGNOSIS — H16142 Punctate keratitis, left eye: Secondary | ICD-10-CM | POA: Diagnosis not present

## 2021-06-21 ENCOUNTER — Encounter: Payer: Self-pay | Admitting: Family Medicine

## 2021-06-26 DIAGNOSIS — H16142 Punctate keratitis, left eye: Secondary | ICD-10-CM | POA: Diagnosis not present

## 2021-06-27 ENCOUNTER — Encounter: Payer: Self-pay | Admitting: Pulmonary Disease

## 2021-06-27 ENCOUNTER — Ambulatory Visit: Payer: PPO | Admitting: Pulmonary Disease

## 2021-06-27 VITALS — BP 128/82 | HR 108 | Temp 98.1°F | Ht 64.0 in | Wt 172.8 lb

## 2021-06-27 DIAGNOSIS — J329 Chronic sinusitis, unspecified: Secondary | ICD-10-CM | POA: Diagnosis not present

## 2021-06-27 DIAGNOSIS — J40 Bronchitis, not specified as acute or chronic: Secondary | ICD-10-CM

## 2021-06-27 MED ORDER — LEVOFLOXACIN 500 MG PO TABS: 500.0000 mg | ORAL_TABLET | Freq: Every day | ORAL | 0 refills | Status: DC

## 2021-06-27 NOTE — Progress Notes (Signed)
Anna Marquez    [id number]    09-20-1950  Primary Care Physician:Matthews, Einar Pheasant, DO  Referring Physician: [name], [place] [address],  [place] [address]  Chief complaint:   Sinus congestion, sinus fullness  HPI:  Recent treatment in the hospital for pneumonia, atrial fibrillation, hemoptysis Hemoptysis resolved with steroids, bronchodilators, and course of Levaquin  She had used a course of azithromycin prior to the hospitalization  She still does have issues with atrial fibrillation for which she is following up with cardiology She is on Eliquis  Has had some nasal stuffiness and congestion She does use albuterol as needed  She does have a nebulizer at home which she has not had to use  She does use Flonase and Claritin for allergies  Breathing has progressively improved since she left the hospital  She does not feel limited with activities of daily living  No unusual shortness of breath  She is not back to usual activity level yet but is feeling generally better   Outpatient Encounter Medications as of 06/27/2021  Medication Sig   acetaminophen (TYLENOL) 500 MG tablet Take 1,000 mg by mouth every 6 (six) hours as needed for moderate pain or headache.   albuterol (PROVENTIL) (2.5 MG/3ML) 0.083% nebulizer solution Take 3 mLs (2.5 mg total) by nebulization every 4 (four) hours as needed for wheezing or shortness of breath (please include nebulizer machine, hoses, and mask if needed.).   albuterol (VENTOLIN HFA) 108 (90 Base) MCG/ACT inhaler INHALE 1 TO 2 PUFFS INTO THE LUNGS EVERY 6 HOURS AS NEEDED FOR WHEEZING OR SHORTNESS OF BREATH.   Ascorbic Acid (VITAMIN C) 1000 MG tablet Take 1,000 mg by mouth daily.   Cholecalciferol (VITAMIN D) 50 MCG (2000 UT) tablet Take 2,000 Units by mouth daily.   diclofenac Sodium (VOLTAREN) 1 % GEL Apply 2-4 g topically 4 (four) times daily as needed (knees,ankles and hand pain).   diltiazem  (CARDIZEM CD) 180 MG 24 hr capsule Take 1 capsule (180 mg total) by mouth daily.   ELIQUIS 5 MG TABS tablet TAKE 1 TABLET BY MOUTH 2 TIMES DAILY.   erythromycin ophthalmic ointment Place 1 application. into the left eye 4 (four) times daily.   ferrous sulfate 325 (65 FE) MG tablet Take 325 mg by mouth every other day.   fluticasone (FLONASE) 50 MCG/ACT nasal spray PLACE 1 SPRAY INTO BOTH NOSTRILS AS NEEDED FOR ALLERGIES OR RHINITIS.   furosemide (LASIX) 20 MG tablet TAKE 1 TABLET (20 MG TOTAL) BY MOUTH DAILY AS NEEDED FOR EDEMA.   levofloxacin (LEVAQUIN) 500 MG tablet Take 1 tablet (500 mg total) by mouth daily.   loratadine (CLARITIN) 10 MG tablet Take 10 mg by mouth daily.    methimazole (TAPAZOLE) 5 MG tablet Take 1 tablet (5 mg total) by mouth 3 (three) times daily.   nadolol (CORGARD) 40 MG tablet Take 1 tablet (40 mg total) by mouth 2 (two) times daily.   pantoprazole (PROTONIX) 40 MG tablet TAKE 1 TABLET (40 MG TOTAL) BY MOUTH DAILY.   potassium chloride (KLOR-CON) 10 MEQ tablet TAKE 1 TABLET BY MOUTH DAILY AS NEEDED (TAKE WITH HCTZ).   prednisoLONE acetate (PRED FORTE) 1 % ophthalmic suspension Place 1 drop into the left eye 4 (four) times daily.   Probiotic Product (PROBIOTIC PO) Take 1 capsule by mouth daily.   vitamin E 180 MG (400 UNITS) capsule Take 400 Units by mouth daily.  ZINC CITRATE PO Take 22 mg by mouth daily.   No facility-administered encounter medications on file as of 06/27/2021.    Allergies as of 06/27/2021 - Review Complete 06/27/2021  Allergen Reaction Noted   Erythromycin     Nsaids Other (See Comments) 08/21/2015   Penicillins Hives and Shortness Of Breath 08/10/2007    Past Medical History:  Diagnosis Date   Adenomatous colon polyp 2003   Cholelithiasis    COVID    Diverticulosis of colon    Gastric polyps    GERD (gastroesophageal reflux disease)    Helicobacter pylori (H. pylori)    Hiatal hernia    Hypertension    Mitral valve prolapse     Nephrolithiasis    Osteoarthritis    Osteopenia 03/2017   T score -1.5 FRAX 8.9% / 0.9%.  Stable from prior DEXA 2016   Persistent atrial fibrillation (HCC)    TGA (transient global amnesia)     Past Surgical History:  Procedure Laterality Date   ANTERIOR AND POSTERIOR REPAIR N/A 06/14/2014   Procedure: ANTERIOR (CYSTOCELE) AND POSTERIOR REPAIR (RECTOCELE);  Surgeon: Anastasio Auerbach, MD;  Location: [facility];  Service: Gynecology;  Laterality: N/A;   ATRIAL FIBRILLATION ABLATION N/A 02/11/2019   Procedure: ATRIAL FIBRILLATION ABLATION;  Surgeon: Thompson Grayer, MD;  Location: [facility];  Service: Cardiovascular;  Laterality: N/A;   CARDIOVERSION N/A 01/15/2019   Procedure: CARDIOVERSION;  Surgeon: Fay Records, MD;  Location: [facility];  Service: Cardiovascular;  Laterality: N/A;   CHOLECYSTECTOMY     kidney stone removal     x2   VAGINAL HYSTERECTOMY N/A 06/14/2014   Procedure: HYSTERECTOMY VAGINAL;  Surgeon: Anastasio Auerbach, MD;  Location: [facility];  Service: Gynecology;  Laterality: N/A;    Family History  Problem Relation Age of Onset   Cancer Father        prostate   Breast cancer Paternal Aunt 71   Diabetes Maternal Grandmother    Heart disease Maternal Grandmother    Heart disease Maternal Grandfather    Melanoma Paternal Aunt    Neuropathy Mother    Congestive Heart Failure Mother    Thyroid disease Daughter     Social History   Socioeconomic History   Marital status: Divorced    Spouse name: Not on file   Number of children: 2   Years of education: 12th grade   Highest education level: High school graduate  Occupational History   Occupation: Retired,  Tobacco Use   Smoking status: Never   Smokeless tobacco: Never  Vaping Use   Vaping Use: Never used  Substance and Sexual Activity   Alcohol use: Never    Alcohol/week: 0.0 standard drinks of alcohol   Drug use: No   Sexual activity: Yes    Birth control/protection: Post-menopausal    Comment: 1st  intercourse 71 yo-Fewer than 5 partners  Other Topics Concern   Not on file  Social History Narrative   Lives along with her dog. She enjoys croteching, crafts, making wreaths, reading and gardening in her free time.    Social Determinants of Health   Financial Resource Strain: Low Risk  (03/26/2021)   Overall Financial Resource Strain (CARDIA)    Difficulty of Paying Living Expenses: Not hard at all  Food Insecurity: No Food Insecurity (03/26/2021)   Hunger Vital Sign    Worried About Running Out of Food in the Last Year: Never true    Ran Out of Food in the Last Year:  Never true  Transportation Needs: No Transportation Needs (03/26/2021)   PRAPARE - Hydrologist (Medical): No    Lack of Transportation (Non-Medical): No  Physical Activity: Sufficiently Active (03/26/2021)   Exercise Vital Sign    Days of Exercise per Week: 3 days    Minutes of Exercise per Session: 120 min  Stress: No Stress Concern Present (03/26/2021)   [place]    Feeling of Stress : Not at all  Social Connections: Moderately Isolated (03/26/2021)   Social Connection and Isolation Panel [NHANES]    Frequency of Communication with Friends and Family: More than three times a week    Frequency of Social Gatherings with Friends and Family: More than three times a week    Attends Religious Services: More than 4 times per year    Active Member of [employer] or Organizations: No    Attends Archivist Meetings: Never    Marital Status: Divorced  Human resources officer Violence: Not At Risk (03/26/2021)   Humiliation, Afraid, Rape, and Kick questionnaire    Fear of Current or Ex-Partner: No    Emotionally Abused: No    Physically Abused: No    Sexually Abused: No    Review of Systems  Constitutional:  Positive for fatigue.    Vitals:   06/27/21 1102  BP: 128/82  Pulse: (!) 108  Temp: 98.1 F (36.7 C)  SpO2: 97%      Physical Exam Constitutional:      Appearance: She is obese.  HENT:     Head: Normocephalic.     Mouth/Throat:     Mouth: Mucous membranes are moist.  Cardiovascular:     Rate and Rhythm: Normal rate and regular rhythm.     Heart sounds: No murmur heard.    No friction rub.  Pulmonary:     Effort: No respiratory distress.     Breath sounds: No stridor. No wheezing or rhonchi.  Musculoskeletal:     Cervical back: No rigidity or tenderness.  Neurological:     Mental Status: She is alert.  Psychiatric:        Mood and Affect: Mood normal.    Data Reviewed: Hospital records reviewed Recent chest x-ray reviewed  Assessment:  Recent pneumonia  Recent history of hemoptysis  Atrial fibrillation  Sinusitis  Plan/Recommendations: Course of Levaquin was provided to patient to be used if progressive symptoms  Continue albuterol as needed  Continue Flonase and Claritin for allergies  Call with significant concerns  Tentative follow-up in 3 months  Encouraged to call with significant concerns  May consider pulmonary function test if still has concerns for shortness of breath at next visit   Sherrilyn Rist MD [facility] 06/27/2021, 11:23 AM  CC: [name], DO

## 2021-06-27 NOTE — Patient Instructions (Signed)
I will see you back in about 3 months  Prescription for Levaquin 500 p.o. daily for 7 days sent to pharmacy for you If you feel your nasal stuffiness, congestion, feels more like a sinus infection, then you may go ahead and use the Levaquin  Call with significant concerns  We can consider a breathing study following your next visit if you are still struggling with your breathing  Continue follow-up with cardiology regarding your A-fib  Call with significant concerns

## 2021-06-29 ENCOUNTER — Encounter: Payer: Self-pay | Admitting: Family Medicine

## 2021-07-01 ENCOUNTER — Telehealth: Payer: Self-pay | Admitting: Emergency Medicine

## 2021-07-01 ENCOUNTER — Emergency Department (INDEPENDENT_AMBULATORY_CARE_PROVIDER_SITE_OTHER)
Admission: RE | Admit: 2021-07-01 | Discharge: 2021-07-01 | Disposition: A | Discharge status: Home / Self Care | Payer: PPO | Source: Ambulatory Visit | Attending: Family Medicine | Admitting: Family Medicine

## 2021-07-01 VITALS — BP 124/74 | HR 125 | Temp 99.4°F | Resp 20 | Ht 64.0 in | Wt 170.0 lb

## 2021-07-01 DIAGNOSIS — R051 Acute cough: Secondary | ICD-10-CM

## 2021-07-01 DIAGNOSIS — I4891 Unspecified atrial fibrillation: Secondary | ICD-10-CM

## 2021-07-01 MED ORDER — HYDROCOD POLI-CHLORPHE POLI ER 10-8 MG/5ML PO SUER: 5.0000 mL | Freq: Two times a day (BID) | ORAL | 0 refills | Status: DC | PRN

## 2021-07-01 MED ORDER — PREDNISONE 20 MG PO TABS: 40.0000 mg | ORAL_TABLET | Freq: Every day | ORAL | 0 refills | Status: DC

## 2021-07-01 NOTE — Telephone Encounter (Signed)
Call to [facility] to see if she was able to come in earlier based on history & reasons for visit. Patient lives 30 mins away, but will be here before her 11am appointment time. Dr Delton See updated. Warren Lacy for call

## 2021-07-02 ENCOUNTER — Emergency Department (HOSPITAL_COMMUNITY): Payer: PPO

## 2021-07-02 ENCOUNTER — Other Ambulatory Visit: Payer: Self-pay

## 2021-07-02 ENCOUNTER — Encounter (HOSPITAL_COMMUNITY): Payer: Self-pay

## 2021-07-02 ENCOUNTER — Inpatient Hospital Stay (HOSPITAL_COMMUNITY)
Admission: EM | Admit: 2021-07-02 | Discharge: 2021-07-11 | DRG: 291 | Disposition: A | Discharge status: Home / Self Care | Payer: PPO | Attending: Internal Medicine | Admitting: Internal Medicine

## 2021-07-02 DIAGNOSIS — I3139 Other pericardial effusion (noninflammatory): Secondary | ICD-10-CM | POA: Diagnosis present

## 2021-07-02 DIAGNOSIS — D631 Anemia in chronic kidney disease: Secondary | ICD-10-CM | POA: Diagnosis present

## 2021-07-02 DIAGNOSIS — I110 Hypertensive heart disease with heart failure: Secondary | ICD-10-CM | POA: Diagnosis not present

## 2021-07-02 DIAGNOSIS — J9811 Atelectasis: Secondary | ICD-10-CM | POA: Diagnosis not present

## 2021-07-02 DIAGNOSIS — E0590 Thyrotoxicosis, unspecified without thyrotoxic crisis or storm: Secondary | ICD-10-CM | POA: Diagnosis not present

## 2021-07-02 DIAGNOSIS — K219 Gastro-esophageal reflux disease without esophagitis: Secondary | ICD-10-CM | POA: Diagnosis present

## 2021-07-02 DIAGNOSIS — S36119A Unspecified injury of liver, initial encounter: Secondary | ICD-10-CM | POA: Diagnosis not present

## 2021-07-02 DIAGNOSIS — E872 Acidosis, unspecified: Secondary | ICD-10-CM

## 2021-07-02 DIAGNOSIS — Z8616 Personal history of COVID-19: Secondary | ICD-10-CM

## 2021-07-02 DIAGNOSIS — R739 Hyperglycemia, unspecified: Secondary | ICD-10-CM

## 2021-07-02 DIAGNOSIS — I340 Nonrheumatic mitral (valve) insufficiency: Secondary | ICD-10-CM | POA: Diagnosis present

## 2021-07-02 DIAGNOSIS — Z833 Family history of diabetes mellitus: Secondary | ICD-10-CM

## 2021-07-02 DIAGNOSIS — N390 Urinary tract infection, site not specified: Secondary | ICD-10-CM | POA: Diagnosis present

## 2021-07-02 DIAGNOSIS — Z90710 Acquired absence of both cervix and uterus: Secondary | ICD-10-CM

## 2021-07-02 DIAGNOSIS — R17 Unspecified jaundice: Secondary | ICD-10-CM

## 2021-07-02 DIAGNOSIS — N182 Chronic kidney disease, stage 2 (mild): Secondary | ICD-10-CM | POA: Diagnosis present

## 2021-07-02 DIAGNOSIS — Z880 Allergy status to penicillin: Secondary | ICD-10-CM

## 2021-07-02 DIAGNOSIS — Z79899 Other long term (current) drug therapy: Secondary | ICD-10-CM

## 2021-07-02 DIAGNOSIS — D649 Anemia, unspecified: Secondary | ICD-10-CM | POA: Diagnosis not present

## 2021-07-02 DIAGNOSIS — I952 Hypotension due to drugs: Secondary | ICD-10-CM | POA: Diagnosis not present

## 2021-07-02 DIAGNOSIS — R092 Respiratory arrest: Secondary | ICD-10-CM | POA: Diagnosis not present

## 2021-07-02 DIAGNOSIS — E876 Hypokalemia: Secondary | ICD-10-CM | POA: Diagnosis present

## 2021-07-02 DIAGNOSIS — R000 Tachycardia, unspecified: Secondary | ICD-10-CM | POA: Diagnosis not present

## 2021-07-02 DIAGNOSIS — I5033 Acute on chronic diastolic (congestive) heart failure: Secondary | ICD-10-CM

## 2021-07-02 DIAGNOSIS — E1165 Type 2 diabetes mellitus with hyperglycemia: Secondary | ICD-10-CM | POA: Diagnosis present

## 2021-07-02 DIAGNOSIS — J302 Other seasonal allergic rhinitis: Secondary | ICD-10-CM | POA: Diagnosis present

## 2021-07-02 DIAGNOSIS — I10 Essential (primary) hypertension: Secondary | ICD-10-CM | POA: Diagnosis not present

## 2021-07-02 DIAGNOSIS — R918 Other nonspecific abnormal finding of lung field: Secondary | ICD-10-CM | POA: Diagnosis not present

## 2021-07-02 DIAGNOSIS — R579 Shock, unspecified: Secondary | ICD-10-CM | POA: Diagnosis not present

## 2021-07-02 DIAGNOSIS — Z7952 Long term (current) use of systemic steroids: Secondary | ICD-10-CM

## 2021-07-02 DIAGNOSIS — Z9049 Acquired absence of other specified parts of digestive tract: Secondary | ICD-10-CM

## 2021-07-02 DIAGNOSIS — I130 Hypertensive heart and chronic kidney disease with heart failure and stage 1 through stage 4 chronic kidney disease, or unspecified chronic kidney disease: Principal | ICD-10-CM | POA: Diagnosis present

## 2021-07-02 DIAGNOSIS — N179 Acute kidney failure, unspecified: Secondary | ICD-10-CM | POA: Diagnosis present

## 2021-07-02 DIAGNOSIS — I4819 Other persistent atrial fibrillation: Secondary | ICD-10-CM | POA: Diagnosis present

## 2021-07-02 DIAGNOSIS — J9601 Acute respiratory failure with hypoxia: Secondary | ICD-10-CM

## 2021-07-02 DIAGNOSIS — I480 Paroxysmal atrial fibrillation: Secondary | ICD-10-CM | POA: Diagnosis not present

## 2021-07-02 DIAGNOSIS — I499 Cardiac arrhythmia, unspecified: Secondary | ICD-10-CM | POA: Diagnosis not present

## 2021-07-02 DIAGNOSIS — I509 Heart failure, unspecified: Secondary | ICD-10-CM | POA: Diagnosis not present

## 2021-07-02 DIAGNOSIS — I5032 Chronic diastolic (congestive) heart failure: Secondary | ICD-10-CM | POA: Diagnosis present

## 2021-07-02 DIAGNOSIS — Z8249 Family history of ischemic heart disease and other diseases of the circulatory system: Secondary | ICD-10-CM

## 2021-07-02 DIAGNOSIS — Z881 Allergy status to other antibiotic agents status: Secondary | ICD-10-CM

## 2021-07-02 DIAGNOSIS — E1122 Type 2 diabetes mellitus with diabetic chronic kidney disease: Secondary | ICD-10-CM | POA: Diagnosis present

## 2021-07-02 DIAGNOSIS — Z886 Allergy status to analgesic agent status: Secondary | ICD-10-CM

## 2021-07-02 DIAGNOSIS — M1990 Unspecified osteoarthritis, unspecified site: Secondary | ICD-10-CM | POA: Diagnosis present

## 2021-07-02 DIAGNOSIS — T4275XA Adverse effect of unspecified antiepileptic and sedative-hypnotic drugs, initial encounter: Secondary | ICD-10-CM | POA: Diagnosis not present

## 2021-07-02 DIAGNOSIS — R945 Abnormal results of liver function studies: Secondary | ICD-10-CM | POA: Diagnosis not present

## 2021-07-02 DIAGNOSIS — E0520 Thyrotoxicosis with toxic multinodular goiter without thyrotoxic crisis or storm: Secondary | ICD-10-CM | POA: Diagnosis present

## 2021-07-02 DIAGNOSIS — R300 Dysuria: Secondary | ICD-10-CM | POA: Diagnosis not present

## 2021-07-02 DIAGNOSIS — I5023 Acute on chronic systolic (congestive) heart failure: Secondary | ICD-10-CM | POA: Diagnosis present

## 2021-07-02 DIAGNOSIS — Z7901 Long term (current) use of anticoagulants: Secondary | ICD-10-CM

## 2021-07-02 DIAGNOSIS — Z4682 Encounter for fitting and adjustment of non-vascular catheter: Secondary | ICD-10-CM | POA: Diagnosis not present

## 2021-07-02 DIAGNOSIS — R7309 Other abnormal glucose: Secondary | ICD-10-CM | POA: Diagnosis not present

## 2021-07-02 DIAGNOSIS — R059 Cough, unspecified: Secondary | ICD-10-CM | POA: Diagnosis not present

## 2021-07-02 DIAGNOSIS — J80 Acute respiratory distress syndrome: Secondary | ICD-10-CM | POA: Diagnosis not present

## 2021-07-02 DIAGNOSIS — R1310 Dysphagia, unspecified: Secondary | ICD-10-CM | POA: Diagnosis not present

## 2021-07-02 DIAGNOSIS — I959 Hypotension, unspecified: Secondary | ICD-10-CM | POA: Diagnosis not present

## 2021-07-02 DIAGNOSIS — R0602 Shortness of breath: Secondary | ICD-10-CM | POA: Diagnosis not present

## 2021-07-02 DIAGNOSIS — I4891 Unspecified atrial fibrillation: Secondary | ICD-10-CM | POA: Diagnosis not present

## 2021-07-02 LAB — CBC
HCT: 32.3 % — ABNORMAL LOW (ref 36.0–46.0)
Hemoglobin: 10.9 g/dL — ABNORMAL LOW (ref 12.0–15.0)
MCH: 30.4 pg (ref 26.0–34.0)
MCHC: 33.7 g/dL (ref 30.0–36.0)
MCV: 90.2 fL (ref 80.0–100.0)
Platelets: 330 10*3/uL (ref 150–400)
RBC: 3.58 MIL/uL — ABNORMAL LOW (ref 3.87–5.11)
RDW: 14 % (ref 11.5–15.5)
WBC: 12.9 10*3/uL — ABNORMAL HIGH (ref 4.0–10.5)
nRBC: 0 % (ref 0.0–0.2)

## 2021-07-03 ENCOUNTER — Other Ambulatory Visit (HOSPITAL_COMMUNITY): Payer: Self-pay

## 2021-07-03 ENCOUNTER — Inpatient Hospital Stay (HOSPITAL_COMMUNITY): Payer: PPO

## 2021-07-03 ENCOUNTER — Ambulatory Visit (HOSPITAL_COMMUNITY): Payer: PPO

## 2021-07-03 ENCOUNTER — Observation Stay (HOSPITAL_COMMUNITY): Payer: PPO

## 2021-07-03 DIAGNOSIS — R945 Abnormal results of liver function studies: Secondary | ICD-10-CM | POA: Diagnosis not present

## 2021-07-03 DIAGNOSIS — I4819 Other persistent atrial fibrillation: Secondary | ICD-10-CM

## 2021-07-03 DIAGNOSIS — I10 Essential (primary) hypertension: Secondary | ICD-10-CM | POA: Diagnosis not present

## 2021-07-03 DIAGNOSIS — I480 Paroxysmal atrial fibrillation: Secondary | ICD-10-CM | POA: Diagnosis not present

## 2021-07-03 DIAGNOSIS — J80 Acute respiratory distress syndrome: Secondary | ICD-10-CM | POA: Diagnosis not present

## 2021-07-03 DIAGNOSIS — Z7901 Long term (current) use of anticoagulants: Secondary | ICD-10-CM | POA: Diagnosis not present

## 2021-07-03 DIAGNOSIS — R579 Shock, unspecified: Secondary | ICD-10-CM | POA: Insufficient documentation

## 2021-07-03 DIAGNOSIS — Z4682 Encounter for fitting and adjustment of non-vascular catheter: Secondary | ICD-10-CM | POA: Diagnosis not present

## 2021-07-03 DIAGNOSIS — E0520 Thyrotoxicosis with toxic multinodular goiter without thyrotoxic crisis or storm: Secondary | ICD-10-CM | POA: Diagnosis not present

## 2021-07-03 DIAGNOSIS — M1990 Unspecified osteoarthritis, unspecified site: Secondary | ICD-10-CM | POA: Diagnosis present

## 2021-07-03 DIAGNOSIS — S36119A Unspecified injury of liver, initial encounter: Secondary | ICD-10-CM | POA: Diagnosis not present

## 2021-07-03 DIAGNOSIS — I509 Heart failure, unspecified: Secondary | ICD-10-CM | POA: Diagnosis not present

## 2021-07-03 DIAGNOSIS — I340 Nonrheumatic mitral (valve) insufficiency: Secondary | ICD-10-CM | POA: Diagnosis present

## 2021-07-03 DIAGNOSIS — N179 Acute kidney failure, unspecified: Secondary | ICD-10-CM

## 2021-07-03 DIAGNOSIS — E876 Hypokalemia: Secondary | ICD-10-CM

## 2021-07-03 DIAGNOSIS — E872 Acidosis, unspecified: Secondary | ICD-10-CM | POA: Diagnosis present

## 2021-07-03 DIAGNOSIS — R7309 Other abnormal glucose: Secondary | ICD-10-CM

## 2021-07-03 DIAGNOSIS — J9601 Acute respiratory failure with hypoxia: Secondary | ICD-10-CM

## 2021-07-03 DIAGNOSIS — E1165 Type 2 diabetes mellitus with hyperglycemia: Secondary | ICD-10-CM | POA: Diagnosis present

## 2021-07-03 DIAGNOSIS — R092 Respiratory arrest: Secondary | ICD-10-CM | POA: Diagnosis not present

## 2021-07-03 DIAGNOSIS — Z8616 Personal history of COVID-19: Secondary | ICD-10-CM | POA: Diagnosis not present

## 2021-07-03 DIAGNOSIS — R0602 Shortness of breath: Secondary | ICD-10-CM | POA: Diagnosis not present

## 2021-07-03 DIAGNOSIS — I5033 Acute on chronic diastolic (congestive) heart failure: Secondary | ICD-10-CM

## 2021-07-03 DIAGNOSIS — E0590 Thyrotoxicosis, unspecified without thyrotoxic crisis or storm: Secondary | ICD-10-CM

## 2021-07-03 DIAGNOSIS — D631 Anemia in chronic kidney disease: Secondary | ICD-10-CM | POA: Diagnosis not present

## 2021-07-03 DIAGNOSIS — R1310 Dysphagia, unspecified: Secondary | ICD-10-CM | POA: Diagnosis not present

## 2021-07-03 DIAGNOSIS — R739 Hyperglycemia, unspecified: Secondary | ICD-10-CM | POA: Diagnosis not present

## 2021-07-03 DIAGNOSIS — D649 Anemia, unspecified: Secondary | ICD-10-CM | POA: Diagnosis not present

## 2021-07-03 DIAGNOSIS — K219 Gastro-esophageal reflux disease without esophagitis: Secondary | ICD-10-CM | POA: Diagnosis present

## 2021-07-03 DIAGNOSIS — N182 Chronic kidney disease, stage 2 (mild): Secondary | ICD-10-CM | POA: Diagnosis present

## 2021-07-03 DIAGNOSIS — E1122 Type 2 diabetes mellitus with diabetic chronic kidney disease: Secondary | ICD-10-CM | POA: Diagnosis not present

## 2021-07-03 DIAGNOSIS — J302 Other seasonal allergic rhinitis: Secondary | ICD-10-CM | POA: Diagnosis present

## 2021-07-03 DIAGNOSIS — I130 Hypertensive heart and chronic kidney disease with heart failure and stage 1 through stage 4 chronic kidney disease, or unspecified chronic kidney disease: Secondary | ICD-10-CM | POA: Diagnosis not present

## 2021-07-03 DIAGNOSIS — I5023 Acute on chronic systolic (congestive) heart failure: Secondary | ICD-10-CM | POA: Diagnosis present

## 2021-07-03 DIAGNOSIS — R918 Other nonspecific abnormal finding of lung field: Secondary | ICD-10-CM | POA: Diagnosis not present

## 2021-07-03 DIAGNOSIS — I952 Hypotension due to drugs: Secondary | ICD-10-CM | POA: Diagnosis not present

## 2021-07-03 DIAGNOSIS — Z9049 Acquired absence of other specified parts of digestive tract: Secondary | ICD-10-CM | POA: Diagnosis not present

## 2021-07-03 DIAGNOSIS — N390 Urinary tract infection, site not specified: Secondary | ICD-10-CM | POA: Diagnosis present

## 2021-07-03 DIAGNOSIS — I3139 Other pericardial effusion (noninflammatory): Secondary | ICD-10-CM | POA: Diagnosis not present

## 2021-07-03 DIAGNOSIS — I4891 Unspecified atrial fibrillation: Secondary | ICD-10-CM | POA: Diagnosis not present

## 2021-07-03 DIAGNOSIS — I110 Hypertensive heart disease with heart failure: Secondary | ICD-10-CM | POA: Diagnosis not present

## 2021-07-03 LAB — COOXEMETRY PANEL
Carboxyhemoglobin: 1.5 % (ref 0.5–1.5)
Methemoglobin: <0.7 % (ref 0.0–1.5)
O2 Saturation: 75.5 %
Total hemoglobin: 11.8 g/dL — ABNORMAL LOW (ref 12.0–16.0)

## 2021-07-03 LAB — COMPREHENSIVE METABOLIC PANEL
ALT: 45 U/L — ABNORMAL HIGH (ref 0–44)
ALT: 199 U/L — ABNORMAL HIGH (ref 0–44)
AST: 28 U/L (ref 15–41)
AST: 219 U/L — ABNORMAL HIGH (ref 15–41)
Albumin: 3 g/dL — ABNORMAL LOW (ref 3.5–5.0)
Albumin: 2.9 g/dL — ABNORMAL LOW (ref 3.5–5.0)
Alkaline Phosphatase: 85 U/L (ref 38–126)
Alkaline Phosphatase: 87 U/L (ref 38–126)
Anion gap: 15 (ref 5–15)
Anion gap: 19 — ABNORMAL HIGH (ref 5–15)
BUN: 27 mg/dL — ABNORMAL HIGH (ref 8–23)
BUN: 38 mg/dL — ABNORMAL HIGH (ref 8–23)
CO2: 15 mmol/L — ABNORMAL LOW (ref 22–32)
CO2: 18 mmol/L — ABNORMAL LOW (ref 22–32)
Calcium: 9.4 mg/dL (ref 8.9–10.3)
Calcium: 9.1 mg/dL (ref 8.9–10.3)
Chloride: 105 mmol/L (ref 98–111)
Chloride: 105 mmol/L (ref 98–111)
Creatinine, Ser: 1.58 mg/dL — ABNORMAL HIGH (ref 0.44–1.00)
Creatinine, Ser: 2.48 mg/dL — ABNORMAL HIGH (ref 0.44–1.00)
GFR, Estimated: 35 mL/min — ABNORMAL LOW (ref >60)
GFR, Estimated: 20 mL/min — ABNORMAL LOW (ref >60)
Glucose, Bld: 299 mg/dL — ABNORMAL HIGH (ref 70–99)
Glucose, Bld: 185 mg/dL — ABNORMAL HIGH (ref 70–99)
Potassium: 3.4 mmol/L — ABNORMAL LOW (ref 3.5–5.1)
Potassium: 3.1 mmol/L — ABNORMAL LOW (ref 3.5–5.1)
Sodium: 135 mmol/L (ref 135–145)
Sodium: 142 mmol/L (ref 135–145)
Total Bilirubin: 1.4 mg/dL — ABNORMAL HIGH (ref 0.3–1.2)
Total Bilirubin: 2.1 mg/dL — ABNORMAL HIGH (ref 0.3–1.2)
Total Protein: 6.4 g/dL — ABNORMAL LOW (ref 6.5–8.1)
Total Protein: 6.6 g/dL (ref 6.5–8.1)

## 2021-07-03 LAB — URINALYSIS, ROUTINE W REFLEX MICROSCOPIC
Bilirubin Urine: NEGATIVE
Glucose, UA: 150 mg/dL — AB
Hgb urine dipstick: NEGATIVE
Ketones, ur: 5 mg/dL — AB
Nitrite: NEGATIVE
Protein, ur: >=300 mg/dL — AB
Specific Gravity, Urine: 1.024 (ref 1.005–1.030)
pH: 5 (ref 5.0–8.0)

## 2021-07-03 LAB — GLUCOSE, CAPILLARY
Glucose-Capillary: 153 mg/dL — ABNORMAL HIGH (ref 70–99)
Glucose-Capillary: 294 mg/dL — ABNORMAL HIGH (ref 70–99)
Glucose-Capillary: 186 mg/dL — ABNORMAL HIGH (ref 70–99)
Glucose-Capillary: 148 mg/dL — ABNORMAL HIGH (ref 70–99)

## 2021-07-03 LAB — POCT I-STAT 7, (LYTES, BLD GAS, ICA,H+H)
Acid-base deficit: 11 mmol/L — ABNORMAL HIGH (ref 0.0–2.0)
Acid-base deficit: 7 mmol/L — ABNORMAL HIGH (ref 0.0–2.0)
Bicarbonate: 13.9 mmol/L — ABNORMAL LOW (ref 20.0–28.0)
Bicarbonate: 18 mmol/L — ABNORMAL LOW (ref 20.0–28.0)
Calcium, Ion: 1.23 mmol/L (ref 1.15–1.40)
Calcium, Ion: 1.16 mmol/L (ref 1.15–1.40)
HCT: 38 % (ref 36.0–46.0)
HCT: 32 % — ABNORMAL LOW (ref 36.0–46.0)
Hemoglobin: 12.9 g/dL (ref 12.0–15.0)
Hemoglobin: 10.9 g/dL — ABNORMAL LOW (ref 12.0–15.0)
O2 Saturation: 95 %
O2 Saturation: 93 %
Patient temperature: 99.2
Patient temperature: 97.6
Potassium: 3.8 mmol/L (ref 3.5–5.1)
Potassium: 4 mmol/L (ref 3.5–5.1)
Sodium: 138 mmol/L (ref 135–145)
Sodium: 139 mmol/L (ref 135–145)
TCO2: 15 mmol/L — ABNORMAL LOW (ref 22–32)
TCO2: 19 mmol/L — ABNORMAL LOW (ref 22–32)
pCO2 arterial: 29.3 mmHg — ABNORMAL LOW (ref 32–48)
pCO2 arterial: 34.2 mmHg (ref 32–48)
pH, Arterial: 7.286 — ABNORMAL LOW (ref 7.35–7.45)
pH, Arterial: 7.325 — ABNORMAL LOW (ref 7.35–7.45)
pO2, Arterial: 82 mmHg — ABNORMAL LOW (ref 83–108)
pO2, Arterial: 69 mmHg — ABNORMAL LOW (ref 83–108)

## 2021-07-03 LAB — LIPASE, BLOOD: Lipase: 42 U/L (ref 11–51)

## 2021-07-03 LAB — CBG MONITORING, ED: Glucose-Capillary: 120 mg/dL — ABNORMAL HIGH (ref 70–99)

## 2021-07-03 LAB — PROCALCITONIN: Procalcitonin: 0.89 ng/mL

## 2021-07-03 LAB — TROPONIN I (HIGH SENSITIVITY): Troponin I (High Sensitivity): 6 ng/L (ref <18)

## 2021-07-03 LAB — LACTIC ACID, PLASMA: Lactic Acid, Venous: 4.7 mmol/L (ref 0.5–1.9)

## 2021-07-03 LAB — BRAIN NATRIURETIC PEPTIDE: B Natriuretic Peptide: 699.7 pg/mL — ABNORMAL HIGH (ref 0.0–100.0)

## 2021-07-03 LAB — MRSA NEXT GEN BY PCR, NASAL: MRSA by PCR Next Gen: NOT DETECTED

## 2021-07-03 MED ORDER — POLYETHYLENE GLYCOL 3350 17 G PO PACK
17.0000 g | PACK | Freq: Every day | ORAL | Status: DC
  Administered 2021-07-03 – 2021-07-05 (×3): 17 g
  Filled 2021-07-03 (×3): qty 1

## 2021-07-03 MED ORDER — FUROSEMIDE 10 MG/ML IJ SOLN
60.0000 mg | Freq: Once | INTRAMUSCULAR | Status: AC
  Administered 2021-07-03: 60 mg via INTRAVENOUS
  Filled 2021-07-03: qty 6

## 2021-07-03 MED ORDER — PANTOPRAZOLE 2 MG/ML SUSPENSION
40.0000 mg | Freq: Every day | ORAL | Status: DC
  Administered 2021-07-03 – 2021-07-05 (×3): 40 mg
  Filled 2021-07-03 (×4): qty 20

## 2021-07-03 MED ORDER — FLUTICASONE PROPIONATE 50 MCG/ACT NA SUSP: 1.0000 | NASAL | Status: DC | PRN

## 2021-07-03 MED ORDER — FENTANYL CITRATE (PF) 100 MCG/2ML IJ SOLN
50.0000 ug | Freq: Once | INTRAMUSCULAR | Status: AC
  Administered 2021-07-03: 50 ug via INTRAVENOUS

## 2021-07-03 MED ORDER — ETOMIDATE 2 MG/ML IV SOLN
INTRAVENOUS | Status: AC
  Administered 2021-07-03: 20 mg via INTRAVENOUS
  Filled 2021-07-03: qty 20

## 2021-07-03 MED ORDER — FENTANYL CITRATE (PF) 100 MCG/2ML IJ SOLN
25.0000 ug | INTRAMUSCULAR | Status: DC | PRN
  Administered 2021-07-03: 50 ug via INTRAVENOUS
  Filled 2021-07-03: qty 2

## 2021-07-03 MED ORDER — PROPOFOL 1000 MG/100ML IV EMUL
0.0000 ug/kg/min | INTRAVENOUS | Status: DC
  Administered 2021-07-03: 5 ug/kg/min via INTRAVENOUS
  Administered 2021-07-03: 35 ug/kg/min via INTRAVENOUS
  Administered 2021-07-04 (×3): 20 ug/kg/min via INTRAVENOUS
  Administered 2021-07-05: 30 ug/kg/min via INTRAVENOUS
  Filled 2021-07-03 (×6): qty 100
  Filled 2021-07-03: qty 1300

## 2021-07-03 MED ORDER — FENTANYL CITRATE (PF) 100 MCG/2ML IJ SOLN
25.0000 ug | INTRAMUSCULAR | Status: DC | PRN
  Administered 2021-07-03: 25 ug via INTRAVENOUS

## 2021-07-03 MED ORDER — ORAL CARE MOUTH RINSE: 15.0000 mL | OROMUCOSAL | Status: DC | PRN

## 2021-07-03 MED ORDER — SODIUM CHLORIDE 0.9% FLUSH
3.0000 mL | Freq: Two times a day (BID) | INTRAVENOUS | Status: DC
  Administered 2021-07-03 – 2021-07-11 (×11): 3 mL via INTRAVENOUS

## 2021-07-03 MED ORDER — FENTANYL 2500MCG IN NS 250ML (10MCG/ML) PREMIX INFUSION
25.0000 ug/h | INTRAVENOUS | Status: DC
  Administered 2021-07-03: 25 ug/h via INTRAVENOUS
  Administered 2021-07-04: 150 ug/h via INTRAVENOUS
  Administered 2021-07-05: 100 ug/h via INTRAVENOUS
  Filled 2021-07-03 (×3): qty 250

## 2021-07-03 MED ORDER — APIXABAN 5 MG PO TABS
5.0000 mg | ORAL_TABLET | Freq: Two times a day (BID) | ORAL | Status: DC
  Administered 2021-07-03 (×2): 5 mg via ORAL
  Filled 2021-07-03 (×2): qty 1

## 2021-07-03 MED ORDER — NOREPINEPHRINE 16 MG/250ML-% IV SOLN
0.0000 ug/min | INTRAVENOUS | Status: DC
  Administered 2021-07-03: 40 ug/min via INTRAVENOUS
  Administered 2021-07-04: 42 ug/min via INTRAVENOUS
  Filled 2021-07-03 (×3): qty 250

## 2021-07-03 MED ORDER — SODIUM CHLORIDE 0.9 % IV SOLN: 250.0000 mL | INTRAVENOUS | Status: DC | PRN

## 2021-07-03 MED ORDER — MIDAZOLAM HCL 2 MG/2ML IJ SOLN
INTRAMUSCULAR | Status: AC
  Filled 2021-07-03: qty 2

## 2021-07-03 MED ORDER — VASOPRESSIN 20 UNITS/100 ML INFUSION FOR SHOCK
0.0000 [IU]/min | INTRAVENOUS | Status: DC
  Administered 2021-07-03 – 2021-07-05 (×4): 0.03 [IU]/min via INTRAVENOUS
  Filled 2021-07-03 (×4): qty 100

## 2021-07-03 MED ORDER — DOCUSATE SODIUM 50 MG/5ML PO LIQD: 100.0000 mg | Freq: Two times a day (BID) | ORAL | Status: DC

## 2021-07-03 MED ORDER — NADOLOL 40 MG PO TABS
40.0000 mg | ORAL_TABLET | Freq: Two times a day (BID) | ORAL | Status: DC
Start: 2021-07-03 — End: 2021-07-03
  Administered 2021-07-03: 40 mg via ORAL
  Filled 2021-07-03 (×2): qty 1

## 2021-07-03 MED ORDER — IPRATROPIUM-ALBUTEROL 0.5-2.5 (3) MG/3ML IN SOLN
3.0000 mL | Freq: Once | RESPIRATORY_TRACT | Status: AC
  Administered 2021-07-03: 3 mL via RESPIRATORY_TRACT
  Filled 2021-07-03: qty 3

## 2021-07-03 MED ORDER — PANTOPRAZOLE 2 MG/ML SUSPENSION
40.0000 mg | Freq: Every day | ORAL | Status: DC
  Filled 2021-07-03: qty 20

## 2021-07-03 MED ORDER — FENTANYL CITRATE (PF) 100 MCG/2ML IJ SOLN
25.0000 ug | Freq: Once | INTRAMUSCULAR | Status: AC
  Administered 2021-07-03: 25 ug via INTRAVENOUS

## 2021-07-03 MED ORDER — FOSFOMYCIN TROMETHAMINE 3 G PO PACK
3.0000 g | PACK | Freq: Once | ORAL | Status: AC
  Administered 2021-07-03: 3 g
  Filled 2021-07-03: qty 3

## 2021-07-03 MED ORDER — DILTIAZEM HCL-DEXTROSE 125-5 MG/125ML-% IV SOLN (PREMIX)
5.0000 mg/h | INTRAVENOUS | Status: DC
  Filled 2021-07-03: qty 125

## 2021-07-03 MED ORDER — POTASSIUM CHLORIDE CRYS ER 20 MEQ PO TBCR
40.0000 meq | EXTENDED_RELEASE_TABLET | Freq: Every day | ORAL | Status: DC
  Filled 2021-07-03: qty 2

## 2021-07-03 MED ORDER — AMIODARONE LOAD VIA INFUSION
150.0000 mg | Freq: Once | INTRAVENOUS | Status: AC
  Administered 2021-07-03: 150 mg via INTRAVENOUS
  Filled 2021-07-03: qty 83.34

## 2021-07-03 MED ORDER — FUROSEMIDE 10 MG/ML IJ SOLN: 40.0000 mg | Freq: Every day | INTRAMUSCULAR | Status: DC

## 2021-07-03 MED ORDER — APIXABAN 5 MG PO TABS
5.0000 mg | ORAL_TABLET | Freq: Two times a day (BID) | ORAL | Status: DC
  Administered 2021-07-04 (×2): 5 mg
  Filled 2021-07-03 (×2): qty 1

## 2021-07-03 MED ORDER — ONDANSETRON HCL 4 MG/2ML IJ SOLN: 4.0000 mg | Freq: Four times a day (QID) | INTRAMUSCULAR | Status: DC | PRN

## 2021-07-03 MED ORDER — INSULIN ASPART 100 UNIT/ML IJ SOLN
0.0000 [IU] | Freq: Three times a day (TID) | INTRAMUSCULAR | Status: DC
  Administered 2021-07-03 – 2021-07-04 (×3): 3 [IU] via SUBCUTANEOUS
  Administered 2021-07-04 (×2): 2 [IU] via SUBCUTANEOUS

## 2021-07-03 MED ORDER — CHLORHEXIDINE GLUCONATE CLOTH 2 % EX PADS
6.0000 | MEDICATED_PAD | Freq: Every day | CUTANEOUS | Status: DC
  Administered 2021-07-03 – 2021-07-07 (×5): 6 via TOPICAL

## 2021-07-03 MED ORDER — AMIODARONE HCL IN DEXTROSE 360-4.14 MG/200ML-% IV SOLN
60.0000 mg/h | INTRAVENOUS | Status: DC
  Administered 2021-07-03 – 2021-07-08 (×13): 30 mg/h via INTRAVENOUS
  Administered 2021-07-09: 60 mg/h via INTRAVENOUS
  Administered 2021-07-09: 30 mg/h via INTRAVENOUS
  Administered 2021-07-09: 60 mg/h via INTRAVENOUS
  Administered 2021-07-09: 30 mg/h via INTRAVENOUS
  Administered 2021-07-10: 60 mg/h via INTRAVENOUS
  Filled 2021-07-03 (×15): qty 200

## 2021-07-03 MED ORDER — DOCUSATE SODIUM 50 MG/5ML PO LIQD
100.0000 mg | Freq: Two times a day (BID) | ORAL | Status: DC
  Administered 2021-07-03 – 2021-07-05 (×5): 100 mg
  Filled 2021-07-03 (×6): qty 10

## 2021-07-03 MED ORDER — FUROSEMIDE 10 MG/ML IJ SOLN: 20.0000 mg | Freq: Once | INTRAMUSCULAR | Status: DC

## 2021-07-03 MED ORDER — POLYETHYLENE GLYCOL 3350 17 G PO PACK: 17.0000 g | PACK | Freq: Every day | ORAL | Status: DC

## 2021-07-03 MED ORDER — DILTIAZEM HCL ER COATED BEADS 180 MG PO CP24
180.0000 mg | ORAL_CAPSULE | Freq: Every day | ORAL | Status: DC
  Administered 2021-07-03: 180 mg via ORAL
  Filled 2021-07-03: qty 1

## 2021-07-03 MED ORDER — METHIMAZOLE 5 MG PO TABS
5.0000 mg | ORAL_TABLET | Freq: Three times a day (TID) | ORAL | Status: DC
  Administered 2021-07-03 – 2021-07-04 (×6): 5 mg via ORAL
  Filled 2021-07-03 (×8): qty 1

## 2021-07-03 MED ORDER — AMIODARONE HCL IN DEXTROSE 360-4.14 MG/200ML-% IV SOLN
60.0000 mg/h | INTRAVENOUS | Status: AC
  Administered 2021-07-03: 60 mg/h via INTRAVENOUS
  Filled 2021-07-03 (×2): qty 200

## 2021-07-03 MED ORDER — ETOMIDATE 2 MG/ML IV SOLN: 10.0000 mg | Freq: Once | INTRAVENOUS | Status: AC

## 2021-07-03 MED ORDER — PANTOPRAZOLE SODIUM 40 MG PO TBEC
40.0000 mg | DELAYED_RELEASE_TABLET | Freq: Every day | ORAL | Status: DC
  Administered 2021-07-03: 40 mg via ORAL
  Filled 2021-07-03: qty 1

## 2021-07-03 MED ORDER — NOREPINEPHRINE 4 MG/250ML-% IV SOLN
0.0000 ug/min | INTRAVENOUS | Status: DC
  Administered 2021-07-03: 13 ug/min via INTRAVENOUS
  Administered 2021-07-03: 2 ug/min via INTRAVENOUS
  Filled 2021-07-03 (×3): qty 250

## 2021-07-03 MED ORDER — FUROSEMIDE 10 MG/ML IJ SOLN
40.0000 mg | Freq: Once | INTRAMUSCULAR | Status: AC
  Administered 2021-07-03: 40 mg via INTRAVENOUS
  Filled 2021-07-03: qty 4

## 2021-07-03 MED ORDER — ORAL CARE MOUTH RINSE
15.0000 mL | OROMUCOSAL | Status: DC
  Administered 2021-07-03 – 2021-07-05 (×25): 15 mL via OROMUCOSAL

## 2021-07-03 MED ORDER — ACETAMINOPHEN 325 MG PO TABS
650.0000 mg | ORAL_TABLET | ORAL | Status: DC | PRN
  Administered 2021-07-03 – 2021-07-04 (×2): 650 mg via ORAL
  Filled 2021-07-03 (×3): qty 2

## 2021-07-03 MED ORDER — ALBUTEROL SULFATE (2.5 MG/3ML) 0.083% IN NEBU
2.5000 mg | INHALATION_SOLUTION | RESPIRATORY_TRACT | Status: DC | PRN
  Administered 2021-07-03: 2.5 mg via RESPIRATORY_TRACT
  Filled 2021-07-03 (×2): qty 3

## 2021-07-03 MED ORDER — FENTANYL BOLUS VIA INFUSION
25.0000 ug | INTRAVENOUS | Status: DC | PRN
  Administered 2021-07-05 (×2): 50 ug via INTRAVENOUS

## 2021-07-03 MED ORDER — POTASSIUM CHLORIDE 20 MEQ PO PACK
40.0000 meq | PACK | Freq: Once | ORAL | Status: AC
  Administered 2021-07-03: 40 meq
  Filled 2021-07-03: qty 2

## 2021-07-03 MED ORDER — SODIUM CHLORIDE 0.9% FLUSH: 3.0000 mL | INTRAVENOUS | Status: DC | PRN

## 2021-07-03 MED ORDER — FUROSEMIDE 10 MG/ML IJ SOLN
60.0000 mg | Freq: Two times a day (BID) | INTRAMUSCULAR | Status: DC
  Administered 2021-07-03 – 2021-07-04 (×2): 60 mg via INTRAVENOUS
  Filled 2021-07-03 (×2): qty 6

## 2021-07-03 MED ORDER — FUROSEMIDE 10 MG/ML IJ SOLN: 40.0000 mg | Freq: Two times a day (BID) | INTRAMUSCULAR | Status: DC

## 2021-07-03 MED ORDER — POTASSIUM CHLORIDE CRYS ER 20 MEQ PO TBCR
40.0000 meq | EXTENDED_RELEASE_TABLET | Freq: Once | ORAL | Status: AC
  Administered 2021-07-03: 40 meq via ORAL
  Filled 2021-07-03: qty 2

## 2021-07-03 NOTE — Assessment & Plan Note (Addendum)
Continue with methimazole.  Follow up THS continue to be low, high free T4 and low free T3.

## 2021-07-03 NOTE — Assessment & Plan Note (Addendum)
Continue close blood pressure monitoring  

## 2021-07-03 NOTE — Assessment & Plan Note (Addendum)
Cell count has been stable, no indication for PRBC transfusion.

## 2021-07-03 NOTE — Assessment & Plan Note (Addendum)
Hypokalemia. Hypomagnesemia   Patient tolerating well diuresis with furosemide, spironolactone and empagliflozin.   Stable renal function with serum cr at 1.1 with K at 3,7 and serum bicarbonate at 23. Mg is 2,3  Add 40 meq Kcl and follow up renal function and electrolytes in am.

## 2021-07-03 NOTE — Assessment & Plan Note (Deleted)
Just like last time, suspect decompensation of CHF is secondary to being back in A.Fib. PNA felt less likely at this point.  No fever in ED. 1. Check procalcitonin (negative last admit as well), PNA felt less likely, I think CHF is driving her presentation this time. 1. Probably had PNA / bronchitis last week with the fever but suspect successfully treated with levaquin. 2. Note also the BNP 699 2. CHF pathway 3. Tele monitor 4. Lasix '40mg'$  IV daily, first dose now 5. Strict intake and output 6. BMP daily with diuresis 1. Replace K 7. Watch for signs / symptoms of PNA 1. But going to hold off on further ABx, steroids for the moment.

## 2021-07-03 NOTE — Assessment & Plan Note (Deleted)
Continue eliquis Continue cardizem Continue corgard Cards consult to see about possible rhythm control since it seems she goes into decompensated CHF when in A.Fib, and then feels better once she is no longer in a.fib.  Although I'm not entirely certain which is the 'cause' and which is the 'effect' here (is also possible that A.Fib is a symptom of the decompensated CHF I suppose). ? If her known moderate MR is getting worse when shes in A.Fib perhaps? Will defer decision on 2d echo to cards though. Tele monitor

## 2021-07-03 NOTE — Assessment & Plan Note (Addendum)
hgb a1c is 5,5 patient with no diabetes mellitus. Capillary glucose has been stable and hyperglycemia has resolved.  Will discontinue insulin therapy and continue capillary glucose monitoring as needed.

## 2021-07-03 NOTE — Progress Notes (Signed)
Subjective: Patient admitted this morning, see detailed H&P by Dr Julian Reil 71 year old female with a history of HFpEF, atrial fibrillation status post ablation on Eliquis.  She had recent admission for CHF believed to be secondary to recurrent atrial fibrillation from 5/30 to 06/09/2021.  Also there was question of pneumonia as well during that admission was found to have hypothyroidism and was started on Tapazole.  She came to ED with worsening shortness of breath for past 2 days.  She was seen at pulmonologist office 1 week ago and diagnosed with bronchitis and was given prescription of Levaquin.  Went to urgent care 2 days ago at that time she was wheezing and was given prednisone. Patient was admitted with acute on chronic diastolic heart failure and started on IV Lasix .  Cardiology was consulted  Vitals:   07/03/21 1555 07/03/21 1600  BP:  (!) 87/65  Pulse: 74 74  Resp: 15 (!) 24  Temp:    SpO2: 99% 98%      A/P Acute hypoxemic respiratory failure -Secondary to acute decompensated HFpEF/pulmonary edema -Patient was started on IV Lasix -However patient was tachypneic and was started on BiPAP -Patient could not tolerate BiPAP, PCCM was consulted and patient was intubated -Patient transferred to ICU under  critical care service  Atrial fibrillation -Seen by cardiology -Started on IV amiodarone    Hendrik Donath S [place] [facility]

## 2021-07-03 NOTE — Progress Notes (Addendum)
Progress Note  Patient Name: Anna Marquez Date of Encounter: 07/04/2021  [facility] Cardiologist: Kristeen Miss, MD   Subjective   Developed acute hypoxic respiratory failure yesterday requiring intubation and transfer to ICU. Remains intubated and sedated this morning.   Flipped back into NSR yesterday evening and remains in NSR today  TTE today per my prelim review with LVEF 50-55%, RV moderately hypokinetic and enlarged (new from prior), moderate MR. ? PE. Planned for V/Q  CVP 6  Inpatient Medications    Scheduled Meds:  apixaban  5 mg Per Tube BID   Chlorhexidine Gluconate Cloth  6 each Topical Daily   docusate  100 mg Per Tube BID   feeding supplement (PROSource TF)  45 mL Per Tube BID   feeding supplement (VITAL HIGH PROTEIN)  1,000 mL Per Tube Q24H   insulin aspart  0-15 Units Subcutaneous TID WC   methimazole  5 mg Oral TID   mouth rinse  15 mL Mouth Rinse Q2H   pantoprazole sodium  40 mg Per Tube Daily   polyethylene glycol  17 g Per Tube Daily   sodium chloride flush  3 mL Intravenous Q12H   Continuous Infusions:  sodium chloride     amiodarone 30 mg/hr (07/04/21 0800)   fentaNYL infusion INTRAVENOUS 150 mcg/hr (07/04/21 0513)   norepinephrine (LEVOPHED) Adult infusion 17 mcg/min (07/04/21 0800)   propofol (DIPRIVAN) infusion 20 mcg/kg/min (07/04/21 0800)   sodium bicarbonate 150 mEq in sterile water 1,150 mL infusion 100 mL/hr at 07/04/21 0912   vasopressin 0.03 Units/min (07/04/21 0800)   PRN Meds: sodium chloride, acetaminophen, albuterol, fentaNYL, fentaNYL (SUBLIMAZE) injection, fentaNYL (SUBLIMAZE) injection, fluticasone, ondansetron (ZOFRAN) IV, mouth rinse, sodium chloride flush   Vital Signs    Vitals:   07/04/21 0900 07/04/21 0915 07/04/21 0930 07/04/21 0945  BP:      Pulse: 72 73 70 69  Resp: (!) 24 (!) 24 18 (!) 24  Temp:      TempSrc:      SpO2: 98% 98% 98% 98%  Weight:      Height:        Intake/Output Summary (Last 24 hours)  at 07/04/2021 0951 Last data filed at 07/04/2021 0917 Gross per 24 hour  Intake 1811.15 ml  Output 1225 ml  Net 586.15 ml      07/04/2021    4:44 AM 07/03/2021    5:20 PM 07/03/2021    8:53 AM  Last 3 Weights  Weight (lbs) 175 lb 0.7 oz 179 lb 10.8 oz 173 lb 4.5 oz  Weight (kg) 79.4 kg 81.5 kg 78.6 kg      Telemetry    Back in NSR today; was in Afib with RVR yesterday. 1 run NSVT - Personally Reviewed  ECG    No new tracing - Personally Reviewed  Physical Exam   GEN: Intubated and sedated Neck: No JVD Cardiac: RRR, no murmurs, rubs, or gallops.  Respiratory: Coarse vent sounds. GI: Soft, nontender, non-distended  MS: No edema; No deformity. Neuro:  Sedated Psych: Unable to assess  Labs    High Sensitivity Troponin:   Recent Labs  Lab 06/05/21 0654 06/05/21 0820 07/02/21 2329  TROPONINIHS 10 11 6      Chemistry Recent Labs  Lab 07/02/21 2329 07/03/21 1455 07/03/21 1530 07/03/21 2325 07/04/21 0412  NA 135   < > 142 139 139  K 3.4*   < > 3.1* 4.0 4.3  CL 105  --  105  --  106  CO2 15*  --  18*  --  16*  GLUCOSE 299*  --  185*  --  119*  BUN 27*  --  38*  --  47*  CREATININE 1.58*  --  2.48*  --  2.93*  CALCIUM 9.4  --  9.1  --  8.2*  MG  --   --   --   --  2.1  PROT 6.4*  --  6.6  --  6.2*  ALBUMIN 3.0*  --  2.9*  --  2.8*  AST 28  --  219*  --  982*  ALT 45*  --  199*  --  733*  ALKPHOS 85  --  87  --  85  BILITOT 1.4*  --  2.1*  --  1.9*  GFRNONAA 35*  --  20*  --  16*  ANIONGAP 15  --  19*  --  17*   < > = values in this interval not displayed.    Lipids  Recent Labs  Lab 07/04/21 0412  TRIG 291*    Hematology Recent Labs  Lab 07/02/21 2329 07/03/21 1455 07/03/21 2325 07/04/21 0412  WBC 12.9*  --   --  19.0*  RBC 3.58*  --   --  3.74*  HGB 10.9* 12.9 10.9* 11.1*  HCT 32.3* 38.0 32.0* 33.5*  MCV 90.2  --   --  89.6  MCH 30.4  --   --  29.7  MCHC 33.7  --   --  33.1  RDW 14.0  --   --  14.7  PLT 330  --   --  405*   Thyroid No  results for input(s): "TSH", "FREET4" in the last 168 hours.  BNP Recent Labs  Lab 07/02/21 2329  BNP 699.7*    DDimer No results for input(s): "DDIMER" in the last 168 hours.   Radiology    DG Chest Port 1 View  Result Date: 07/04/2021 CLINICAL DATA:  ARDS, shortness of breath EXAM: PORTABLE CHEST 1 VIEW COMPARISON:  Chest radiograph 1 day prior FINDINGS: The endotracheal tube tip is approximately 2.7 cm from the carina. The left IJ vascular catheter terminates in the lower SVC. The enteric catheter is coiled in the stomach The cardiomediastinal silhouette is stable. There is persistent retrocardiac consolidation. Opacities in the right base and perihilar regions are improved in the interim. There is no significant pleural effusion. There is no pneumothorax The bones are stable. IMPRESSION: Persistent retrocardiac consolidation but improved opacities in the right base and perihilar regions. No significant pleural effusion. Electronically Signed   By: Lesia Hausen M.D.   On: 07/04/2021 08:09   Portable Chest x-ray  Result Date: 07/03/2021 CLINICAL DATA:  Provided history: ET tube placement. EXAM: PORTABLE CHEST 1 VIEW COMPARISON:  Prior chest radiographs 07/02/2021 and earlier. FINDINGS: Interval intubation. The ET tube terminates 2.3 cm above the level of the carina. Interval placement of a left IJ approach central venous catheter with tip projecting at the level of the superior cavoatrial junction. Partially visualized enteric tube, passing below the level of the left hemidiaphragm. The tip of the enteric tube appears to re-enter the field of view and terminate in the region of the gastric cardia. Borderline cardiomegaly. Bilateral interstitial and airspace opacities, greatest within the left mid lung field and right mid to lower lung field, progressed. No evidence of pleural effusion or pneumothorax. No acute bony abnormality identified. IMPRESSION: Positions of lines and tubes, as described.  Bilateral interstitial and airspace  opacities, progressed from the prior chest radiograph of 07/02/2021. This may reflect pulmonary edema or pneumonia. Borderline cardiomegaly. Electronically Signed   By: Jackey Loge D.O.   On: 07/03/2021 15:11   DG Abd 1 View  Result Date: 07/03/2021 CLINICAL DATA:  Orogastric tube placement EXAM: ABDOMEN - 1 VIEW COMPARISON:  Portable exam 1459 hours compared 09/01/2020 FINDINGS: Nasogastric tube coiled in proximal stomach. Surgical clips RIGHT upper quadrant likely reflect prior cholecystectomy. Patchy infiltrates mid to lower LEFT lung with mild RIGHT basilar atelectasis. IMPRESSION: Nasogastric tube coiled in proximal stomach. Electronically Signed   By: Ulyses Southward M.D.   On: 07/03/2021 15:09   DG Chest 2 View  Result Date: 07/03/2021 CLINICAL DATA:  Shortness of breath and cough. EXAM: CHEST - 2 VIEW COMPARISON:  Jun 05, 2021 FINDINGS: The cardiac silhouette is mildly enlarged. Moderate to marked severity diffusely increased interstitial lung markings are seen with mild areas of atelectasis noted within the bilateral lung bases. Mild perihilar prominence of the pulmonary vasculature is seen. There is no evidence of a pleural effusion or pneumothorax. The visualized skeletal structures are unremarkable. IMPRESSION: 1. Mild cardiomegaly with moderate to marked severity interstitial edema and mild bibasilar atelectasis. Electronically Signed   By: Aram Candela M.D.   On: 07/03/2021 00:01    Cardiac Studies   Echo 01/25/21 1. Left ventricular ejection fraction, by estimation, is 60 to 65%. The  left ventricle has normal function. The left ventricle has no regional  wall motion abnormalities. There is mild left ventricular hypertrophy.  Left ventricular diastolic parameters  are consistent with Grade I diastolic dysfunction (impaired relaxation).   2. Right ventricular systolic function is normal. The right ventricular  size is mildly enlarged.   3. Left  atrial size was moderately dilated.   4. Right atrial size was mildly dilated.   5. The mitral valve is normal in structure. Mild to moderate mitral valve  regurgitation. No evidence of mitral stenosis.   6. The aortic valve is normal in structure. Aortic valve regurgitation is  trivial. No aortic stenosis is present.   7. The inferior vena cava is normal in size with greater than 50%  respiratory variability, suggesting right atrial pressure of 3 mmHg.   Patient Profile     71 y.o. female with history of paroxysmal Afib s/p ablation (had previously failed tikosyn), HTN, asthma, mild-to-moderate MR, and GERD who presented to the ER with worsening SOB found to be in Afib with RVR and overloaded on exam for which Cardiology was consulted.Course complicated by acute hypoxic respiratory failure requiring intubation and transfer to ICU.   Assessment & Plan    #Acute Hypoxic Respiratory Failure: #Hypotension #RV Dysfunction on TTE: #Acute on Chronic Diastolic HF: Patient presented with worsening dyspnea with BNP 700 and CXR with pulmonary edema concerning for acute on chronic diastolic HF exacerbation in the setting of Afib with RVR. Her course was complicated by acute respiratory failure ultimately requiring intubation on 07/03/21. TTE on my review this morning with LVEF 50-55% with mild-moderate MR. RV is mildly enlarged and moderately hypokinetic which raises concern for possible PE vs related to recent respiratory arrest. Given rising Cr, unable to perform CTA. Agree with V/Q scan. CVP normal today and therefore no further diuresis needed. -Agree with V/Q scan given RV dysfunction noted on TTE -Holding diuresis; CVP normal -Continue levophed and vasopressin for blood pressure support -Continue apixaban for now pending V/Q scan; may need to transition to warfarin if failed apixaban (patient's  daughter states she has been compliant with medications_  #Afib with RVR: Now back in NSR on amiodarone  gtt. -Continue amiodarone gtt for today -Unable to tolerate nodal agents due to hypotension; will add as able -Continue apixaban for now pending V/Q scan; may ultimately require warfarin if PE present  #AKI on CKD: Cr 2.93 from baseline in the 1s. Has history of renal stones and possible infection on UA. Agree that she may have been septic from urinary source leading to decompensation from a cardiac standpoint. Currently undergoing further work-up per PCCM.  #Hyperthyroidism: Recently diagnosed. Not related to amiodarone. On methimazole at home.  #Mild to moderate MR: Appears moderate on limited. ? Actually worse given degree of clinical decompensation on arrival.  CRITICAL CARE TIME: I have spent a total of 45 minutes with patient reviewing hospital notes, telemetry, EKGs, labs and examining the patient as well as establishing an assessment and plan that was discussed with the patient.  > 50% of time was spent in direct patient care. The patient is critically ill with multi-organ system failure and requires high complexity decision making for assessment and support, frequent evaluation and titration of therapies, application of advanced monitoring technologies and extensive interpretation of multiple databases.     Plan discussed with family at bedside.  For questions or updates, please contact [facility] Please consult [email or web address] for contact info under        Signed, Meriam Sprague, MD  07/04/2021, 9:51 AM

## 2021-07-03 NOTE — Procedures (Signed)
Intubation Procedure Note  [name]  [id number]  1950-02-09  Date:07/03/21  Time:3:41 PM   Provider Performing:Annelyse Rey C Katrinka Blazing    Procedure: Intubation (31500)  Indication(s) Respiratory Failure  Consent Unable to obtain consent due to emergent nature of procedure.   Anesthesia Etomidate, Fentanyl, Rocuronium, and Succinylcholine   Time Out Verified patient identification, verified procedure, site/side was marked, verified correct patient position, special equipment/implants available, medications/allergies/relevant history reviewed, required imaging and test results available.   Sterile Technique Usual hand hygeine, masks, and gloves were used   Procedure Description Patient positioned in bed supine.  Sedation given as noted above.  Patient was intubated with endotracheal tube using Glidescope.  View was Grade 1 full glottis .  Number of attempts was 1.  Colorimetric CO2 detector was consistent with tracheal placement.   Complications/Tolerance None; patient tolerated the procedure well. Chest X-ray is ordered to verify placement.   EBL Minimal   Specimen(s) None

## 2021-07-03 NOTE — Inpatient Diabetes Management (Signed)
Inpatient Diabetes Program Recommendations  AACE/ADA: New Consensus Statement on Inpatient Glycemic Control (2015)  Target Ranges:  Prepandial:   less than 140 mg/dL      Peak postprandial:   less than 180 mg/dL (1-2 hours)      Critically ill patients:  140 - 180 mg/dL   Lab Results  Component Value Date   GLUCAP 294 (H) 07/03/2021   HGBA1C 5.1 08/21/2015    Review of Glycemic Control  Latest Reference Range & Units 07/03/21 08:08 07/03/21 11:05 07/03/21 13:04  Glucose-Capillary 70 - 99 mg/dL 606 (H) 301 (H) 601 (H)  (H): Data is abnormally high  Diabetes history: No DM hx, Has been on Prednisone 20 mg x 2 days Outpatient Diabetes medications: None Current orders for Inpatient glycemic control: Novolog 0-15 units TID  Inpatient Diabetes Program Recommendations:    Please consider ICU glycemic Control Order Set using Novolog 2-6 units Q4H.   Will continue to follow while inpatient.  Thank you, Dulce Sellar, MSN, CDCES Diabetes Coordinator Inpatient Diabetes Program [phone] (team pager from 8a-5p)

## 2021-07-04 ENCOUNTER — Other Ambulatory Visit (HOSPITAL_COMMUNITY): Payer: PPO

## 2021-07-04 ENCOUNTER — Inpatient Hospital Stay (HOSPITAL_COMMUNITY): Payer: PPO

## 2021-07-04 DIAGNOSIS — N179 Acute kidney failure, unspecified: Secondary | ICD-10-CM | POA: Diagnosis not present

## 2021-07-04 DIAGNOSIS — R0602 Shortness of breath: Secondary | ICD-10-CM

## 2021-07-04 DIAGNOSIS — I340 Nonrheumatic mitral (valve) insufficiency: Secondary | ICD-10-CM | POA: Diagnosis not present

## 2021-07-04 DIAGNOSIS — I5033 Acute on chronic diastolic (congestive) heart failure: Secondary | ICD-10-CM | POA: Diagnosis not present

## 2021-07-04 DIAGNOSIS — I4819 Other persistent atrial fibrillation: Secondary | ICD-10-CM | POA: Diagnosis not present

## 2021-07-04 DIAGNOSIS — I10 Essential (primary) hypertension: Secondary | ICD-10-CM | POA: Diagnosis not present

## 2021-07-04 DIAGNOSIS — R579 Shock, unspecified: Secondary | ICD-10-CM

## 2021-07-04 LAB — HEMOGLOBIN A1C
Hgb A1c MFr Bld: 5.5 % (ref 4.8–5.6)
Mean Plasma Glucose: 111 mg/dL

## 2021-07-04 LAB — ECHOCARDIOGRAM LIMITED
Area-P 1/2: 2.5 cm2
Calc EF: 50.2 %
Height: 64 in
MV M vel: 4.63 m/s
MV Peak grad: 85.7 mmHg
Radius: 0.2 cm
Single Plane A2C EF: 41 %
Single Plane A4C EF: 57.1 %
Weight: 2800.72 oz

## 2021-07-04 LAB — PROCALCITONIN: Procalcitonin: 17.17 ng/mL

## 2021-07-04 LAB — CBC
HCT: 33.5 % — ABNORMAL LOW (ref 36.0–46.0)
Hemoglobin: 11.1 g/dL — ABNORMAL LOW (ref 12.0–15.0)
MCH: 29.7 pg (ref 26.0–34.0)
MCHC: 33.1 g/dL (ref 30.0–36.0)
MCV: 89.6 fL (ref 80.0–100.0)
Platelets: 405 10*3/uL — ABNORMAL HIGH (ref 150–400)
RBC: 3.74 MIL/uL — ABNORMAL LOW (ref 3.87–5.11)
RDW: 14.7 % (ref 11.5–15.5)
WBC: 19 10*3/uL — ABNORMAL HIGH (ref 4.0–10.5)
nRBC: 2.5 % — ABNORMAL HIGH (ref 0.0–0.2)

## 2021-07-04 LAB — BASIC METABOLIC PANEL
Anion gap: 17 — ABNORMAL HIGH (ref 5–15)
BUN: 47 mg/dL — ABNORMAL HIGH (ref 8–23)
CO2: 16 mmol/L — ABNORMAL LOW (ref 22–32)
Calcium: 8.2 mg/dL — ABNORMAL LOW (ref 8.9–10.3)
Chloride: 106 mmol/L (ref 98–111)
Creatinine, Ser: 2.93 mg/dL — ABNORMAL HIGH (ref 0.44–1.00)
GFR, Estimated: 16 mL/min — ABNORMAL LOW (ref >60)
Glucose, Bld: 119 mg/dL — ABNORMAL HIGH (ref 70–99)
Potassium: 4.3 mmol/L (ref 3.5–5.1)
Sodium: 139 mmol/L (ref 135–145)

## 2021-07-04 LAB — GLUCOSE, CAPILLARY
Glucose-Capillary: 127 mg/dL — ABNORMAL HIGH (ref 70–99)
Glucose-Capillary: 147 mg/dL — ABNORMAL HIGH (ref 70–99)
Glucose-Capillary: 173 mg/dL — ABNORMAL HIGH (ref 70–99)
Glucose-Capillary: 191 mg/dL — ABNORMAL HIGH (ref 70–99)

## 2021-07-04 LAB — SEDIMENTATION RATE: Sed Rate: 55 mm/hr — ABNORMAL HIGH (ref 0–22)

## 2021-07-04 LAB — HEPATIC FUNCTION PANEL
ALT: 733 U/L — ABNORMAL HIGH (ref 0–44)
AST: 982 U/L — ABNORMAL HIGH (ref 15–41)
Albumin: 2.8 g/dL — ABNORMAL LOW (ref 3.5–5.0)
Alkaline Phosphatase: 85 U/L (ref 38–126)
Bilirubin, Direct: 0.6 mg/dL — ABNORMAL HIGH (ref 0.0–0.2)
Indirect Bilirubin: 1.3 mg/dL — ABNORMAL HIGH (ref 0.3–0.9)
Total Bilirubin: 1.9 mg/dL — ABNORMAL HIGH (ref 0.3–1.2)
Total Protein: 6.2 g/dL — ABNORMAL LOW (ref 6.5–8.1)

## 2021-07-04 LAB — MAGNESIUM: Magnesium: 2.1 mg/dL (ref 1.7–2.4)

## 2021-07-04 LAB — LACTIC ACID, PLASMA: Lactic Acid, Venous: 2.2 mmol/L (ref 0.5–1.9)

## 2021-07-04 LAB — TRIGLYCERIDES: Triglycerides: 291 mg/dL — ABNORMAL HIGH (ref <150)

## 2021-07-04 MED ORDER — PROSOURCE TF PO LIQD
45.0000 mL | Freq: Two times a day (BID) | ORAL | Status: DC
  Administered 2021-07-04: 45 mL
  Filled 2021-07-04: qty 45

## 2021-07-04 MED ORDER — VITAL HIGH PROTEIN PO LIQD: 1000.0000 mL | ORAL | Status: DC

## 2021-07-04 MED ORDER — STERILE WATER FOR INJECTION IV SOLN
INTRAVENOUS | Status: DC
  Filled 2021-07-04: qty 1000

## 2021-07-04 MED ORDER — VITAL AF 1.2 CAL PO LIQD
1000.0000 mL | ORAL | Status: DC
  Administered 2021-07-04 – 2021-07-05 (×2): 1000 mL
  Filled 2021-07-04 (×5): qty 1000

## 2021-07-04 MED ORDER — ACETAMINOPHEN 160 MG/5ML PO SOLN
650.0000 mg | ORAL | Status: DC | PRN
Start: 2021-07-04 — End: 2021-07-06

## 2021-07-04 MED ORDER — INSULIN ASPART 100 UNIT/ML IJ SOLN
1.0000 [IU] | INTRAMUSCULAR | Status: DC
  Administered 2021-07-05: 2 [IU] via SUBCUTANEOUS
  Administered 2021-07-05: 3 [IU] via SUBCUTANEOUS

## 2021-07-04 MED ORDER — TECHNETIUM TO 99M ALBUMIN AGGREGATED
4.2000 | Freq: Once | INTRAVENOUS | Status: AC | PRN
  Administered 2021-07-04: 4.2 via INTRAVENOUS

## 2021-07-04 MED ORDER — METHIMAZOLE 5 MG PO TABS
5.0000 mg | ORAL_TABLET | Freq: Three times a day (TID) | ORAL | Status: DC
  Administered 2021-07-05: 5 mg
  Filled 2021-07-04 (×4): qty 1

## 2021-07-04 NOTE — Progress Notes (Signed)
Pt transported with vent to nuclear medicine with RN at bedside. No complications during transport. Pt returned to 2M04.

## 2021-07-04 NOTE — Progress Notes (Signed)
eLink Physician-Brief Progress Note Patient Name: Anna Marquez DOB: 04-24-1950 MRN: [id number]   Date of Service  07/04/2021  HPI/Events of Note  ABG on 100%/PRVC 24/TV 430/P 12  = 7.325/34.2/69/18.  eICU Interventions  Continue present ventilator management.      Intervention Category Major Interventions: Respiratory failure - evaluation and management  Tamir Wallman [name] 07/04/2021, 1:30 AM

## 2021-07-04 NOTE — Progress Notes (Addendum)
Initial Nutrition Assessment  DOCUMENTATION CODES:   Not applicable  INTERVENTION:   Initiate tube feeding via OG tube: Vital AF 1.2 at 65 ml/h (1560 ml per day)  Provides 1872 kcal (2120 kcal total with propofol), 108 gm protein, 1168 ml free water daily.  Consider adjust OG tube placement as it is coiled in the stomach with tip pointing up near GE junction.   NUTRITION DIAGNOSIS:   Inadequate oral intake related to inability to eat as evidenced by NPO status.  GOAL:   Patient will meet greater than or equal to 90% of their needs  MONITOR:   TF tolerance, Vent status, Labs  REASON FOR ASSESSMENT:   Ventilator, Consult Enteral/tube feeding initiation and management  ASSESSMENT:   71 yo female admitted with SOB. PMH includes CHF, A fib s/p ablation, newly diagnosed hyperthyroidism, diverticulosis, GERD, H. Pylori, HTN, MVP, osteoarthritis.  Per discussion with patient's family at bedside, patient typically eats well. Since recent hospitalization (for CHF and PNA) a few weeks ago, she has been eating softer foods. They have not noticed any weight changes and intake has been good. Weight history reviewed. No significant weight changes noted PTA.  Discussed patient in ICU rounds and with RN today. Plans for nuclear scan today.  Received MD Consult for TF initiation and management. OG tube in place coiled in the proximal stomach per x-ray. Tip is pointing up towards the esophagus. Discussed with MD; okay to feed with current OG tube placement.  Patient is currently intubated on ventilator support MV: 10 L/min Temp (24hrs), Avg:98.5 F (36.9 C), Min:97 F (36.1 C), Max:99.9 F (37.7 C) MAP >/= 79 this morning Propofol: 9.4 ml/hr providing 248 kcal from lipid  Labs reviewed.  Medications reviewed and include Colace, Novolog, Miralax, amiodarone, fentanyl, Levophed, propofol, vasopressin.   NUTRITION - FOCUSED PHYSICAL EXAM:  Flowsheet Row Most Recent Value   Orbital Region No depletion  Upper Arm Region No depletion  Thoracic and Lumbar Region No depletion  Buccal Region Unable to assess  Temple Region Mild depletion  Clavicle Bone Region No depletion  Clavicle and Acromion Bone Region No depletion  Scapular Bone Region No depletion  Dorsal Hand No depletion  Patellar Region No depletion  Anterior Thigh Region No depletion  Posterior Calf Region Mild depletion  Edema (RD Assessment) Mild  Hair Reviewed  Eyes Unable to assess  Mouth Unable to assess  Skin Reviewed  Nails Reviewed       Diet Order:   Diet Order             Diet NPO time specified  Diet effective now                   EDUCATION NEEDS:   No education needs have been identified at this time  Skin:  Skin Assessment: Skin Integrity Issues: Skin Integrity Issues:: DTI DTI: L buttocks  Last BM:  no BM documented  Height:   Ht Readings from Last 1 Encounters:  07/03/21 '5\' 4"'$  (1.626 m)    Weight:   Wt Readings from Last 1 Encounters:  07/04/21 79.4 kg    Ideal Body Weight:  54.5 kg  BMI:  Body mass index is 30.05 kg/m.  Estimated Nutritional Needs:   Kcal:  1800-2000  Protein:  100-120 gm  Fluid:  1.8-2 L    Lucas Mallow RD, LDN, CNSC Please refer to Amion for contact information.

## 2021-07-04 NOTE — Progress Notes (Signed)
eLink Physician-Brief Progress Note Patient Name: Anna Marquez DOB: 07-11-1950 MRN: [id number]   Date of Service  07/04/2021  HPI/Events of Note  Patient needs Q 4 hours CBG and hyperglycemia treatment protocol orders.  eICU Interventions  Orders entered.        Frederik Pear 07/04/2021, 10:38 PM

## 2021-07-04 NOTE — Progress Notes (Signed)
Lower extremity venous bilateral study completed.  Preliminary results relayed to Smith, MD.  See CV Proc for preliminary results report.   Dorinda Stehr, RDMS, RVT  

## 2021-07-04 NOTE — Progress Notes (Signed)
Heart Failure Stewardship Pharmacist Progress Note   PCP: [name], DO PCP-Cardiologist: Mertie Moores, MD    HPI:  71 yo F with PMH of HTN, afib, and CHF.  She was admitted from 5/30-6/3 for acute respiratory failure due to bronchopneumonia. She was treated with steroids, antibiotics, and lasix (acute CHF due to edema on chest imaging). ECHO at that time showed LVEF 60-65% with mild LVH, G1DD, and mild-moderate MR. Not fluid-overloaded on cardiology follow up 6/13.   On 6/21, she started antibiotics for bronchitis from outpatient pulmonary.  She presented to the ED on 6/26 with shortness of breath, cough, and burning with urination. CXR with mild cardiomegaly and moderate interstitial edema. Her CHF is thought to be exacerbated by her back in afib.  Unfortunatley, on 6/27, she was experiencing increased work of breathing, unable to improve with bipap. Underwent respiratory arrest and was intubated and then sent to the ICU.  Current HF Medications: Diuretic: furosemide 60 mg IV x 1  Prior to admission HF Medications: Diuretic: furosemide 20 mg daily PRN  Pertinent Lab Values: Serum creatinine 2.93 (baseline ~1), BUN 47, Potassium 4.3, Sodium 139, Magnesium pending, BNP 699.7, A1c 5.5 CVP 10, coox 75  Vital Signs: Weight: 175 lbs (admission weight: 173 lbs) Blood pressure: on NE and vasopressin for MAP support Heart rate: 70s - afib I/O: minimal output  Medication Assistance / Insurance Benefits Check: Does the patient have prescription insurance?  Yes Type of insurance plan: [employer]  Does the patient qualify for medication assistance through manufacturers or grants?   Pending Eligible grants and/or patient assistance programs: pending Medication assistance applications in progress: none  Medication assistance applications approved: none Approved medication assistance renewals will be completed by: pending  Outpatient Pharmacy:  Prior to  admission outpatient pharmacy: [facility] Is the patient willing to use [facility] at discharge? Yes Is the patient willing to transition their outpatient pharmacy to utilize a [facility] outpatient pharmacy?   Pending    Assessment: 1. Acute on chronic diastolic CHF (LVEF 27-51%). NYHA class III symptoms. - Received furosemide 60 mg IV x 1 today.  CVP 10. Severe AKI. Stopping IV lasix. Keep K>4 and Mag>2. - On amiodarone for afib. Cardiology consulted. - Burning with urination on admission. Unclear if UTI. Urinalysis nitrite negative with small leukocytes, and few bacteria. Question if sepsis from UTI lead to current presentation.   Plan: 1) Medication changes recommended at this time: - Agree with above  2) Patient assistance: - Farxiga/Jardiance copay $45 - Entresto copay $45  3)  Education  - To be completed prior to discharge  Kerby Nora, PharmD, BCPS Heart Failure Stewardship Pharmacist Phone [phone]

## 2021-07-04 NOTE — H&P (View-Only) (Signed)
Progress Note  Patient Name: Anna Marquez Date of Encounter: 07/05/2021  [facility] Cardiologist: Mertie Moores, MD   Subjective   Remains intubated but is responding to questions this AM. Planned for bedside TEE prior to extubation. Daughter at bedside.  Back in Afib currently. Will plan for DCCV following TEE if no evidence of clot.  V/Q scan negative for PE. LE dopplers negative.   Cr downtrending 2.93>1.67 Inpatient Medications    Scheduled Meds:  Chlorhexidine Gluconate Cloth  6 each Topical Daily   docusate  100 mg Per Tube BID   insulin aspart  0-15 Units Subcutaneous Q4H   methimazole  5 mg Per Tube TID   mouth rinse  15 mL Mouth Rinse Q2H   pantoprazole sodium  40 mg Per Tube Daily   polyethylene glycol  17 g Per Tube Daily   sodium chloride flush  3 mL Intravenous Q12H   Continuous Infusions:  sodium chloride     amiodarone 30 mg/hr (07/05/21 1000)   feeding supplement (VITAL AF 1.2 CAL) 65 mL/hr at 07/05/21 1000   fentaNYL infusion INTRAVENOUS 150 mcg/hr (07/05/21 1000)   norepinephrine (LEVOPHED) Adult infusion 1 mcg/min (07/05/21 1000)   propofol (DIPRIVAN) infusion Stopped (07/05/21 0807)   vasopressin Stopped (07/05/21 0958)   PRN Meds: sodium chloride, acetaminophen (TYLENOL) oral liquid 160 mg/5 mL, albuterol, fentaNYL, fentaNYL (SUBLIMAZE) injection, fentaNYL (SUBLIMAZE) injection, fluticasone, ondansetron (ZOFRAN) IV, mouth rinse, sodium chloride flush   Vital Signs    Vitals:   07/05/21 0738 07/05/21 0800 07/05/21 0900 07/05/21 1000  BP:      Pulse:  96 (!) 107 97  Resp:  (!) 24 16 (!) 24  Temp: 98.5 F (36.9 C)     TempSrc: Axillary     SpO2:  94% 95% 98%  Weight:      Height:        Intake/Output Summary (Last 24 hours) at 07/05/2021 1055 Last data filed at 07/05/2021 1003 Gross per 24 hour  Intake 4178.67 ml  Output 2615 ml  Net 1563.67 ml      07/05/2021    5:00 AM 07/04/2021    4:44 AM 07/03/2021    5:20 PM  Last 3  Weights  Weight (lbs) 171 lb 11.8 oz 175 lb 0.7 oz 179 lb 10.8 oz  Weight (kg) 77.9 kg 79.4 kg 81.5 kg      Telemetry    Afib with HR 100-120s - Personally Reviewed  ECG    No new tracing - Personally Reviewed  Physical Exam   GEN: Intubated but following commands Neck: JVD to mid-neck Cardiac: Tachycardic, irregular, soft systolic murmur Respiratory: Coarse vent sounds. GI: Soft, nontender, non-distended  MS: No edema; No deformity. Neuro:  Nodding yes and no to questions Psych: Unable to assess  Labs    High Sensitivity Troponin:   Recent Labs  Lab 07/02/21 2329  TROPONINIHS 6     Chemistry Recent Labs  Lab 07/03/21 1530 07/03/21 2325 07/04/21 0412 07/05/21 0100  NA 142 139 139 139  K 3.1* 4.0 4.3 2.7*  CL 105  --  106 100  CO2 18*  --  16* 24  GLUCOSE 185*  --  119* 220*  BUN 38*  --  47* 46*  CREATININE 2.48*  --  2.93* 1.67*  CALCIUM 9.1  --  8.2* 7.9*  MG  --   --  2.1 1.8  PROT 6.6  --  6.2* 5.9*  ALBUMIN 2.9*  --  2.8*  2.5*  AST 219*  --  982* 291*  ALT 199*  --  733* 556*  ALKPHOS 87  --  85 72  BILITOT 2.1*  --  1.9* 1.3*  GFRNONAA 20*  --  16* 32*  ANIONGAP 19*  --  17* 15    Lipids  Recent Labs  Lab 07/05/21 0100  TRIG 503*    Hematology Recent Labs  Lab 07/02/21 2329 07/03/21 1455 07/03/21 2325 07/04/21 0412 07/05/21 0100  WBC 12.9*  --   --  19.0* 9.5  RBC 3.58*  --   --  3.74* 3.15*  HGB 10.9*   < > 10.9* 11.1* 9.5*  HCT 32.3*   < > 32.0* 33.5* 27.5*  MCV 90.2  --   --  89.6 87.3  MCH 30.4  --   --  29.7 30.2  MCHC 33.7  --   --  33.1 34.5  RDW 14.0  --   --  14.7 14.4  PLT 330  --   --  405* 250   < > = values in this interval not displayed.   Thyroid No results for input(s): "TSH", "FREET4" in the last 168 hours.  BNP Recent Labs  Lab 07/02/21 2329  BNP 699.7*    DDimer No results for input(s): "DDIMER" in the last 168 hours.   Radiology    US Abdomen Limited RUQ (LIVER/GB)  Result Date:  07/05/2021 CLINICAL DATA:  Abnormal liver function tests EXAM: ULTRASOUND ABDOMEN LIMITED RIGHT UPPER QUADRANT COMPARISON:  None Available. FINDINGS: Gallbladder: Gallbladder is not seen consistent with cholecystectomy. Common bile duct: Diameter: 7.5 mm. There is no significant dilation of intrahepatic bile ducts. Liver: There is slightly increased echogenicity in the liver. There is mild nodularity in the liver surface. No focal abnormality is seen in the visualized portions of liver. Portal vein is patent on color Doppler imaging with normal direction of blood flow towards the liver. Other: Trace amount of fluid is noted adjacent to the right kidney. IMPRESSION: Status post cholecystectomy. There is prominence of proximal common bile duct possibly due to post cholecystectomy state. Increased echogenicity in the liver suggests fatty infiltration. There is mild nodularity in the liver surface suggesting possible cirrhosis. Electronically Signed   By: Elmer Picker M.D.   On: 07/05/2021 10:02   VAS [place] LOWER EXTREMITY VENOUS (DVT)  Result Date: 07/04/2021  Lower Venous DVT Study Patient Name:  Anna Marquez  Date of Exam:   07/04/2021 Medical Rec #: [id number]       Accession #:    [id number] Date of Birth: 1950-02-16       Patient Gender: F Patient Age:   71 years Exam Location:  [facility] Procedure:      VAS [place] LOWER EXTREMITY VENOUS (DVT) Referring Phys: Ina Homes --------------------------------------------------------------------------------  Indications: SOB.  Comparison Study: No prior studies. Performing Technologist: Darlin Coco RDMS, RVT  Examination Guidelines: A complete evaluation includes B-mode imaging, spectral Doppler, color Doppler, and power Doppler as needed of all accessible portions of each vessel. Bilateral testing is considered an integral part of a complete examination. Limited examinations for reoccurring indications may be performed as noted. The reflux portion of the  exam is performed with the patient in reverse Trendelenburg.  +---------+---------------+---------+-----------+----------+--------------+ RIGHT    CompressibilityPhasicitySpontaneityPropertiesThrombus Aging +---------+---------------+---------+-----------+----------+--------------+ CFV      Full           Yes      Yes                                 +---------+---------------+---------+-----------+----------+--------------+  SFJ      Full                                                        +---------+---------------+---------+-----------+----------+--------------+ FV Prox  Full                                                        +---------+---------------+---------+-----------+----------+--------------+ FV Mid   Full                                                        +---------+---------------+---------+-----------+----------+--------------+ FV DistalFull                                                        +---------+---------------+---------+-----------+----------+--------------+ PFV      Full                                                        +---------+---------------+---------+-----------+----------+--------------+ POP      Full           Yes      Yes                                 +---------+---------------+---------+-----------+----------+--------------+ PTV      Full                                                        +---------+---------------+---------+-----------+----------+--------------+ PERO     Full                                                        +---------+---------------+---------+-----------+----------+--------------+   +---------+---------------+---------+-----------+----------+--------------+ LEFT     CompressibilityPhasicitySpontaneityPropertiesThrombus Aging +---------+---------------+---------+-----------+----------+--------------+ CFV      Full           Yes      Yes                                  +---------+---------------+---------+-----------+----------+--------------+ SFJ      Full                                                        +---------+---------------+---------+-----------+----------+--------------+  FV Prox  Full                                                        +---------+---------------+---------+-----------+----------+--------------+ FV Mid   Full                                                        +---------+---------------+---------+-----------+----------+--------------+ FV DistalFull                                                        +---------+---------------+---------+-----------+----------+--------------+ PFV      Full                                                        +---------+---------------+---------+-----------+----------+--------------+ POP      Full           Yes      Yes                                 +---------+---------------+---------+-----------+----------+--------------+ PTV      Full                                                        +---------+---------------+---------+-----------+----------+--------------+ PERO     Full                                                        +---------+---------------+---------+-----------+----------+--------------+     Summary: RIGHT: - There is no evidence of deep vein thrombosis in the lower extremity.  - No cystic structure found in the popliteal fossa.  LEFT: - There is no evidence of deep vein thrombosis in the lower extremity.  - No cystic structure found in the popliteal fossa.  *See table(s) above for measurements and observations. Electronically signed by Orlie Pollen on 07/04/2021 at 5:28:12 PM.    Final    NM Pulmonary Perfusion  Result Date: 07/04/2021 CLINICAL DATA:  Pulmonary embolism suspected, high probability. Developed acute hypoxic respiratory failure. EXAM: NUCLEAR MEDICINE PERFUSION LUNG SCAN TECHNIQUE: Perfusion images  were obtained in multiple projections after intravenous injection of radiopharmaceutical. Ventilation scans intentionally deferred if perfusion scan and chest x-ray adequate for interpretation during COVID 19 epidemic. RADIOPHARMACEUTICALS:  4.2 mCi Tc-26mMAA IV COMPARISON:  Chest radiograph 07/04/2021 FINDINGS: Normal perfusion in both lungs. No significant peripheral or wedge-shaped perfusion abnormalities. IMPRESSION: Normal perfusion examination.  No evidence for pulmonary embolism. Electronically Signed   By:  Markus Daft M.D.   On: 07/04/2021 14:54   US RENAL  Result Date: 07/04/2021 CLINICAL DATA:  Acute kidney injury, history hypertension EXAM: RENAL / URINARY TRACT ULTRASOUND COMPLETE COMPARISON:  01/11/2021 FINDINGS: Right Kidney: Renal measurements: 12.7 x 4.6 x 4.8 cm = volume: 144 mL. Cortical thinning. Normal cortical echogenicity. No mass, hydronephrosis, or shadowing calcification. Left Kidney: Renal measurements: 13.5 x 5.6 x 5.2 cm = volume: 207 mL. Cortical thinning. Normal cortical echogenicity. No mass, hydronephrosis, or shadowing calcification. Bladder: Decompressed by Foley catheter, unable to evaluate. Other: None. IMPRESSION: BILATERAL renal cortical thinning. Otherwise negative exam. Electronically Signed   By: Lavonia Dana M.D.   On: 07/04/2021 10:47   ECHOCARDIOGRAM LIMITED  Result Date: 07/04/2021    ECHOCARDIOGRAM LIMITED REPORT   Patient Name:   Anna Marquez Date of Exam: 07/04/2021 Medical Rec #:  [id number]      Height:       64.0 in Accession #:    [id number]     Weight:       175.0 lb Date of Birth:  03/22/1950      BSA:          1.849 m Patient Age:    38 years       BP:           119/68 mmHg Patient Gender: F              HR:           74 bpm. Exam Location:  Inpatient Procedure: Limited Echo, Color Doppler and Cardiac Doppler STAT ECHO Indications:    Mitral Valve Disorder I05.9  History:        Patient has prior history of Echocardiogram examinations, most                  recent 01/25/2021. Mitral Valve Prolapse, Arrythmias:Atrial                 Fibrillation; Risk Factors:Hypertension.  Sonographer:    Darlina Sicilian RDCS Referring Phys: [id number] [place]  1. Left ventricular ejection fraction, by estimation, is 50 to 55%. The left ventricle has low normal function. The left ventricle has no regional wall motion abnormalities.  2. Right ventricular systolic function is moderately reduced. The right ventricular size is mildly enlarged. There is normal pulmonary artery systolic pressure. The estimated right ventricular systolic pressure is 67.1 mmHg.  3. A small pericardial effusion is present. The pericardial effusion is surrounding the apex and localized near the right ventricle. There is no evidence of cardiac tamponade.  4. PISA 0.2 cm. The mitral valve is normal in structure. Mild to moderate mitral valve regurgitation. No evidence of mitral stenosis.  5. Tricuspid valve regurgitation is mild to moderate.  6. The aortic valve is normal in structure. Aortic valve regurgitation is not visualized. No aortic stenosis is present.  7. The inferior vena cava is dilated in size with >50% respiratory variability, suggesting right atrial pressure of 8 mmHg. Comparison(s): Prior images reviewed side by side. RV size in increased and function reduced when compared to 01/25/2021. FINDINGS  Left Ventricle: Left ventricular ejection fraction, by estimation, is 50 to 55%. The left ventricle has low normal function. The left ventricle has no regional wall motion abnormalities. The left ventricular internal cavity size was normal in size. There is no left ventricular hypertrophy. Right Ventricle: The right ventricular size is mildly enlarged. No increase in right ventricular wall thickness. Right ventricular systolic  function is moderately reduced. There is normal pulmonary artery systolic pressure. The tricuspid regurgitant velocity is 2.48 m/s, and with an assumed right atrial  pressure of 8 mmHg, the estimated right ventricular systolic pressure is 01.7 mmHg. Left Atrium: Left atrial size was normal in size. Right Atrium: Right atrial size was normal in size. Pericardium: A small pericardial effusion is present. The pericardial effusion is surrounding the apex and localized near the right ventricle. There is no evidence of cardiac tamponade. Mitral Valve: PISA 0.2 cm. The mitral valve is normal in structure. Mild mitral annular calcification. Mild to moderate mitral valve regurgitation. No evidence of mitral valve stenosis. Tricuspid Valve: The tricuspid valve is normal in structure. Tricuspid valve regurgitation is mild to moderate. No evidence of tricuspid stenosis. Aortic Valve: The aortic valve is normal in structure. Aortic valve regurgitation is not visualized. No aortic stenosis is present. Pulmonic Valve: The pulmonic valve was normal in structure. Pulmonic valve regurgitation is not visualized. No evidence of pulmonic stenosis. Aorta: The aortic root is normal in size and structure. Venous: The inferior vena cava is dilated in size with greater than 50% respiratory variability, suggesting right atrial pressure of 8 mmHg. IAS/Shunts: No atrial level shunt detected by color flow Doppler. LEFT VENTRICLE PLAX 2D LVOT diam:     2.10 cm LV SV:         28 LV SV Index:   15 LVOT Area:     3.46 cm  LV Volumes (MOD) LV vol d, MOD A2C: 53.6 ml LV vol d, MOD A4C: 61.6 ml LV vol s, MOD A2C: 31.6 ml LV vol s, MOD A4C: 26.4 ml LV SV MOD A2C:     22.0 ml LV SV MOD A4C:     61.6 ml LV SV MOD BP:      29.9 ml AORTIC VALVE LVOT Vmax:   60.70 cm/s LVOT Vmean:  30.900 cm/s LVOT VTI:    0.080 m MITRAL VALVE                  TRICUSPID VALVE MV Area (PHT): 2.50 cm       TR Peak grad:   24.6 mmHg MV Decel Time: 304 msec       TR Vmax:        248.00 cm/s MR Peak grad:    85.7 mmHg MR Mean grad:    49.0 mmHg    SHUNTS MR Vmax:         463.00 cm/s  Systemic VTI:  0.08 m MR Vmean:        322.0 cm/s    Systemic Diam: 2.10 cm MR PISA:         0.25 cm MR PISA Eff ROA: 1 mm MR PISA Radius:  0.20 cm MV E velocity: 47.60 cm/s Candee Furbish MD Electronically signed by Candee Furbish MD Signature Date/Time: 07/04/2021/10:18:04 AM    Final    DG Chest Port 1 View  Result Date: 07/04/2021 CLINICAL DATA:  ARDS, shortness of breath EXAM: PORTABLE CHEST 1 VIEW COMPARISON:  Chest radiograph 1 day prior FINDINGS: The endotracheal tube tip is approximately 2.7 cm from the carina. The left IJ vascular catheter terminates in the lower SVC. The enteric catheter is coiled in the stomach The cardiomediastinal silhouette is stable. There is persistent retrocardiac consolidation. Opacities in the right base and perihilar regions are improved in the interim. There is no significant pleural effusion. There is no pneumothorax The bones are stable. IMPRESSION: Persistent retrocardiac consolidation but  improved opacities in the right base and perihilar regions. No significant pleural effusion. Electronically Signed   By: Valetta Mole M.D.   On: 07/04/2021 08:09   Portable Chest x-ray  Result Date: 07/03/2021 CLINICAL DATA:  Provided history: ET tube placement. EXAM: PORTABLE CHEST 1 VIEW COMPARISON:  Prior chest radiographs 07/02/2021 and earlier. FINDINGS: Interval intubation. The ET tube terminates 2.3 cm above the level of the carina. Interval placement of a left IJ approach central venous catheter with tip projecting at the level of the superior cavoatrial junction. Partially visualized enteric tube, passing below the level of the left hemidiaphragm. The tip of the enteric tube appears to re-enter the field of view and terminate in the region of the gastric cardia. Borderline cardiomegaly. Bilateral interstitial and airspace opacities, greatest within the left mid lung field and right mid to lower lung field, progressed. No evidence of pleural effusion or pneumothorax. No acute bony abnormality identified. IMPRESSION: Positions of  lines and tubes, as described. Bilateral interstitial and airspace opacities, progressed from the prior chest radiograph of 07/02/2021. This may reflect pulmonary edema or pneumonia. Borderline cardiomegaly. Electronically Signed   By: Kellie Simmering D.O.   On: 07/03/2021 15:11   DG Abd 1 View  Result Date: 07/03/2021 CLINICAL DATA:  Orogastric tube placement EXAM: ABDOMEN - 1 VIEW COMPARISON:  Portable exam 1459 hours compared 09/01/2020 FINDINGS: Nasogastric tube coiled in proximal stomach. Surgical clips RIGHT upper quadrant likely reflect prior cholecystectomy. Patchy infiltrates mid to lower LEFT lung with mild RIGHT basilar atelectasis. IMPRESSION: Nasogastric tube coiled in proximal stomach. Electronically Signed   By: Lavonia Dana M.D.   On: 07/03/2021 15:09    Cardiac Studies  TTE 07/04/21: IMPRESSIONS     1. Left ventricular ejection fraction, by estimation, is 50 to 55%. The  left ventricle has low normal function. The left ventricle has no regional  wall motion abnormalities.   2. Right ventricular systolic function is moderately reduced. The right  ventricular size is mildly enlarged. There is normal pulmonary artery  systolic pressure. The estimated right ventricular systolic pressure is  53.2 mmHg.   3. A small pericardial effusion is present. The pericardial effusion is  surrounding the apex and localized near the right ventricle. There is no  evidence of cardiac tamponade.   4. PISA 0.2 cm. The mitral valve is normal in structure. Mild to moderate  mitral valve regurgitation. No evidence of mitral stenosis.   5. Tricuspid valve regurgitation is mild to moderate.   6. The aortic valve is normal in structure. Aortic valve regurgitation is  not visualized. No aortic stenosis is present.   7. The inferior vena cava is dilated in size with >50% respiratory  variability, suggesting right atrial pressure of 8 mmHg.   Comparison(s): Prior images reviewed side by side. RV size in  increased  and function reduced when compared to 01/25/2021.   V/Q 07/04/21: FINDINGS: Normal perfusion in both lungs. No significant peripheral or wedge-shaped perfusion abnormalities.   IMPRESSION: Normal perfusion examination.  No evidence for pulmonary embolism.   Echo 01/25/21 1. Left ventricular ejection fraction, by estimation, is 60 to 65%. The  left ventricle has normal function. The left ventricle has no regional  wall motion abnormalities. There is mild left ventricular hypertrophy.  Left ventricular diastolic parameters  are consistent with Grade I diastolic dysfunction (impaired relaxation).   2. Right ventricular systolic function is normal. The right ventricular  size is mildly enlarged.   3. Left atrial size  was moderately dilated.   4. Right atrial size was mildly dilated.   5. The mitral valve is normal in structure. Mild to moderate mitral valve  regurgitation. No evidence of mitral stenosis.   6. The aortic valve is normal in structure. Aortic valve regurgitation is  trivial. No aortic stenosis is present.   7. The inferior vena cava is normal in size with greater than 50%  respiratory variability, suggesting right atrial pressure of 3 mmHg.   Patient Profile     71 y.o. female with history of paroxysmal Afib s/p ablation (had previously failed tikosyn), HTN, asthma, mild-to-moderate MR, and GERD who presented to the ER with worsening SOB found to be in Afib with RVR and overloaded on exam for which Cardiology was consulted.Course complicated by acute hypoxic respiratory failure requiring intubation and transfer to ICU.   Assessment & Plan    #Acute Hypoxic Respiratory Failure: #Suspected Flash Pulmonary Edema: #Acute on Chronic Diastolic HF: #RV Dysfunction: Patient presented with worsening dyspnea with BNP 700 and CXR with pulmonary edema concerning for acute on chronic diastolic HF exacerbation in the setting of Afib with RVR. Her course was complicated by  acute respiratory failure ultimately requiring intubation on 07/03/21. TTE with LVEF 50-55% with mild-moderate MR. RV is mildly enlarged and moderately hypokinetic. V/Q scan negative. Overall, acute decompensation may have been related to poorly tolerated Afib with RVR with flash pulmonary edema leading to acute respiratory failure. Obtaining bedside TEE to ensure no significant MR. May also have intermittent ischemic MR and ultimately patient will need cath once renal function improves.  -Plan for TEE/DCCV today  -Appears mildly volume up, recommend resuming lasix post TEE as able -PE work-up unrevealing; possibly RV dysfunction related to poorly tolerated Afib and acute respiratory failure -Likely needs cath once renal function returns to baseline to ensure no ischemia (? Intermittent ischemic MR) -Vent management per ICU; likely extubate today following TEE  #Afib with RVR: Back in Afib this morning with HR 110-120s. Plan for DCCV following TEE if no evidence of LAA thrombus. -Plan for TEE/DCCV at bedside today -Continue amiodarone gtt for now -Likely will need ongoing amiodarone PO as she does not tolerate Afib and has failed other antiarrhythmics in the past -Will need close monitoring of her thyroid function given underlying Afib and hyperthyroidism (not related to amiodarone) -May need repeat ablation; will arrange for EP follow-up as outpatient  #Hypotension: Likely related to sedation. Procal elevated but no localizing symptoms of infection.  -Management per PCCM  #AKI on CKD: Improving. Appears mildly volume up today. Will likely need to resume diuresis following TEE as able.  #Hyperthyroidism: Recently diagnosed. Not related to amiodarone. On methimazole at home.  #Mild to moderate MR: Does not appear severe on surface TTE but will obtain TEE as above to ensure it was not underestimated. Given intermittent nature of SOB, there is a possibility of ischemic MR. Likely needs cath  once renal function back to baseline.  CRITICAL CARE TIME: I have spent a total of 45 minutes with patient reviewing hospital notes, telemetry, EKGs, labs and examining the patient as well as establishing an assessment and plan that was discussed with the patient.  > 50% of time was spent in direct patient care. The patient is critically ill with multi-organ system failure and requires high complexity decision making for assessment and support, frequent evaluation and titration of therapies, application of advanced monitoring technologies and extensive interpretation of multiple databases.     Plan discussed with  family at bedside.  For questions or updates, please contact [place] Please consult [email or web address] for contact info under        Signed, Freada Bergeron, MD  07/05/2021, 10:55 AM

## 2021-07-04 NOTE — Progress Notes (Signed)
NAME:  Anna Marquez, MRN:  [id number], DOB:  [birth year], LOS: 1 ADMISSION DATE:  07/02/2021, CONSULTATION DATE:  6/27 REFERRING MD:  Dr. Darrick Meigs, CHIEF COMPLAINT:  Dyspnea   History of Present Illness:  Anna Marquez is a 71 y.o. woman with history of paroxysmal atrial fibrillation on Eliquis s/p ablation, Hypertension, HFpEF, moderate mitral regurgitation. Admitted 07/02/21 for shortness of breath. She was seen in pulmonary clinic 5 days prior to presentation for what was felt to be acute bronchitis in the setting of recent hospital admission for pneumonia. Treated with levaquin and anti-histamines and flonase for sinusitis.   Has also had refractory atrial fibrillation treated as an outpatient in the context of hyperthyroidism. She is now on methimazole for her hyperthryoidism. Regarding her A. Fib has had ablation in 2021 after failing tikosyn therapy. Has been on rate control with BB and eliquis for her a. Fib otherwise.   She is a lifelong neversmoker with recurrent "bronchitis" and "pneumonia" episodes over the last several weeks. She does have seasonal allergies.  Was admitted to through the ED for acute pulmonary edema and A. Fib.  She was started on amiodarone gtt and seen by cardiology and diuresed with IV lasix. She was placed on bipap for respiratory distress and shortly thereafter underwent a respiratory arrest. Was emergently intubated on the floor and transferred to the ICU for further care.   Pertinent  Medical History  Atrial fibrillation Moderate mitral valve regurgitation secondary to prolapse Grade 1 diastolic dysfunction Never smoker Seasonal allergies  Significant Hospital Events: Including procedures, antibiotic start and stop dates in addition to other pertinent events   6/27 admitted, deteriorated, intubated  Interim History / Subjective:  No events, resting on vent.  Objective   Blood pressure 93/74, pulse 79, temperature 99.9 F (37.7 C), temperature source  Oral, resp. rate (!) 23, height _0  (1.626 m), weight 79.4 kg, SpO2 97 %. CVP:  [5 mmHg-24 mmHg] 10 mmHg  Vent Mode: PRVC FiO2 (%):  [100 %] 100 % Set Rate:  [24 bmp] 24 bmp Vt Set:  [430 mL] 430 mL PEEP:  [12 cmH20] 12 cmH20 Plateau Pressure:  [21 cmH20-23 cmH20] 23 cmH20   Intake/Output Summary (Last 24 hours) at 07/04/2021 0825 Last data filed at 07/04/2021 0800 Gross per 24 hour  Intake 1761.15 ml  Output 1225 ml  Net 536.15 ml   Filed Weights   07/03/21 0853 07/03/21 1720 07/04/21 0444  Weight: 78.6 kg 81.5 kg 79.4 kg    Examination: No distress sedated on vent Heart sounds are regular, sinus on monitor Ext warm without much edema Does follow commands but then starts to fight fight Abdomen benign  Cr up with diuretic challenge More metabolic acidosis  CXR looks better some retrocardiac opacities but other edema looks better  Resolved Hospital Problem list     Assessment & Plan:   Acute hypoxemic respiratory failure with CXR c/w edema; preceded by afib.  Now cardioverted with amio.  She has known mitral valve disease and presentation, lack of response to diuretics would be c/w mitral failure.  No loud murmurs on exam.  Echo pending.  While she's intubated would like to see if can get TEE, will d/w cardiology.  Coox 75%. Acute on chronic renal failure- hx of kidney stones, questionable infection on UA.  Other possibility is she has sepsis from a UTI leading to heart decompensation.  CVP only 10.   - Hold further diuretics, 1L bicarb - f/u echo and discuss  TEE with cardiology - Check renal US given hx of stones - Check Pct, ESR - Lung protective tidal volumes limiting driving pressures to < 15cm H2O as able - Sedation titrated to vent compliance and patient comfort using PAD orderset - VAP prevention bundle - Daily SAT/SBT when meets institutional criteria: O2 needs still too high at present  Best Practice (right click and "Reselect all SmartList Selections" daily)    Diet/type: start TF DVT prophylaxis: apixiban, may need to switch to heparin at some point with AKI and potential need for procedrues GI prophylaxis: H2B Lines: Central line Foley:  N/A Code Status:  full code Last date of multidisciplinary goals of care discussion [updated at bedside]  45 min cc time Erskine Emery MD PCCM

## 2021-07-04 NOTE — Progress Notes (Signed)
  Echocardiogram 2D Echocardiogram has been performed.  Anna Marquez M 07/04/2021, 9:08 AM

## 2021-07-04 NOTE — Progress Notes (Signed)
Progress Note  Patient Name: Anna Marquez Date of Encounter: 07/05/2021  [facility] Cardiologist: Mertie Moores, MD   Subjective   Remains intubated but is responding to questions this AM. Planned for bedside TEE prior to extubation. Daughter at bedside.  Back in Afib currently. Will plan for DCCV following TEE if no evidence of clot.  V/Q scan negative for PE. LE dopplers negative.   Cr downtrending 2.93>1.67 Inpatient Medications    Scheduled Meds:  Chlorhexidine Gluconate Cloth  6 each Topical Daily   docusate  100 mg Per Tube BID   insulin aspart  0-15 Units Subcutaneous Q4H   methimazole  5 mg Per Tube TID   mouth rinse  15 mL Mouth Rinse Q2H   pantoprazole sodium  40 mg Per Tube Daily   polyethylene glycol  17 g Per Tube Daily   sodium chloride flush  3 mL Intravenous Q12H   Continuous Infusions:  sodium chloride     amiodarone 30 mg/hr (07/05/21 1000)   feeding supplement (VITAL AF 1.2 CAL) 65 mL/hr at 07/05/21 1000   fentaNYL infusion INTRAVENOUS 150 mcg/hr (07/05/21 1000)   norepinephrine (LEVOPHED) Adult infusion 1 mcg/min (07/05/21 1000)   propofol (DIPRIVAN) infusion Stopped (07/05/21 0807)   vasopressin Stopped (07/05/21 0958)   PRN Meds: sodium chloride, acetaminophen (TYLENOL) oral liquid 160 mg/5 mL, albuterol, fentaNYL, fentaNYL (SUBLIMAZE) injection, fentaNYL (SUBLIMAZE) injection, fluticasone, ondansetron (ZOFRAN) IV, mouth rinse, sodium chloride flush   Vital Signs    Vitals:   07/05/21 0738 07/05/21 0800 07/05/21 0900 07/05/21 1000  BP:      Pulse:  96 (!) 107 97  Resp:  (!) 24 16 (!) 24  Temp: 98.5 F (36.9 C)     TempSrc: Axillary     SpO2:  94% 95% 98%  Weight:      Height:        Intake/Output Summary (Last 24 hours) at 07/05/2021 1055 Last data filed at 07/05/2021 1003 Gross per 24 hour  Intake 4178.67 ml  Output 2615 ml  Net 1563.67 ml      07/05/2021    5:00 AM 07/04/2021    4:44 AM 07/03/2021    5:20 PM  Last 3  Weights  Weight (lbs) 171 lb 11.8 oz 175 lb 0.7 oz 179 lb 10.8 oz  Weight (kg) 77.9 kg 79.4 kg 81.5 kg      Telemetry    Afib with HR 100-120s - Personally Reviewed  ECG    No new tracing - Personally Reviewed  Physical Exam   GEN: Intubated but following commands Neck: JVD to mid-neck Cardiac: Tachycardic, irregular, soft systolic murmur Respiratory: Coarse vent sounds. GI: Soft, nontender, non-distended  MS: No edema; No deformity. Neuro:  Nodding yes and no to questions Psych: Unable to assess  Labs    High Sensitivity Troponin:   Recent Labs  Lab 07/02/21 2329  TROPONINIHS 6     Chemistry Recent Labs  Lab 07/03/21 1530 07/03/21 2325 07/04/21 0412 07/05/21 0100  NA 142 139 139 139  K 3.1* 4.0 4.3 2.7*  CL 105  --  106 100  CO2 18*  --  16* 24  GLUCOSE 185*  --  119* 220*  BUN 38*  --  47* 46*  CREATININE 2.48*  --  2.93* 1.67*  CALCIUM 9.1  --  8.2* 7.9*  MG  --   --  2.1 1.8  PROT 6.6  --  6.2* 5.9*  ALBUMIN 2.9*  --  2.8*  2.5*  AST 219*  --  982* 291*  ALT 199*  --  733* 556*  ALKPHOS 87  --  85 72  BILITOT 2.1*  --  1.9* 1.3*  GFRNONAA 20*  --  16* 32*  ANIONGAP 19*  --  17* 15    Lipids  Recent Labs  Lab 07/05/21 0100  TRIG 503*    Hematology Recent Labs  Lab 07/02/21 2329 07/03/21 1455 07/03/21 2325 07/04/21 0412 07/05/21 0100  WBC 12.9*  --   --  19.0* 9.5  RBC 3.58*  --   --  3.74* 3.15*  HGB 10.9*   < > 10.9* 11.1* 9.5*  HCT 32.3*   < > 32.0* 33.5* 27.5*  MCV 90.2  --   --  89.6 87.3  MCH 30.4  --   --  29.7 30.2  MCHC 33.7  --   --  33.1 34.5  RDW 14.0  --   --  14.7 14.4  PLT 330  --   --  405* 250   < > = values in this interval not displayed.   Thyroid No results for input(s): "TSH", "FREET4" in the last 168 hours.  BNP Recent Labs  Lab 07/02/21 2329  BNP 699.7*    DDimer No results for input(s): "DDIMER" in the last 168 hours.   Radiology    US Abdomen Limited RUQ (LIVER/GB)  Result Date:  07/05/2021 CLINICAL DATA:  Abnormal liver function tests EXAM: ULTRASOUND ABDOMEN LIMITED RIGHT UPPER QUADRANT COMPARISON:  None Available. FINDINGS: Gallbladder: Gallbladder is not seen consistent with cholecystectomy. Common bile duct: Diameter: 7.5 mm. There is no significant dilation of intrahepatic bile ducts. Liver: There is slightly increased echogenicity in the liver. There is mild nodularity in the liver surface. No focal abnormality is seen in the visualized portions of liver. Portal vein is patent on color Doppler imaging with normal direction of blood flow towards the liver. Other: Trace amount of fluid is noted adjacent to the right kidney. IMPRESSION: Status post cholecystectomy. There is prominence of proximal common bile duct possibly due to post cholecystectomy state. Increased echogenicity in the liver suggests fatty infiltration. There is mild nodularity in the liver surface suggesting possible cirrhosis. Electronically Signed   By: Elmer Picker M.D.   On: 07/05/2021 10:02   VAS [place] LOWER EXTREMITY VENOUS (DVT)  Result Date: 07/04/2021  Lower Venous DVT Study Patient Name:  Anna Marquez  Date of Exam:   07/04/2021 Medical Rec #: [id number]       Accession #:    [id number] Date of Birth: 08-19-1950       Patient Gender: F Patient Age:   71 years Exam Location:  [facility] Procedure:      VAS [place] LOWER EXTREMITY VENOUS (DVT) Referring Phys: Ina Homes --------------------------------------------------------------------------------  Indications: SOB.  Comparison Study: No prior studies. Performing Technologist: Darlin Coco RDMS, RVT  Examination Guidelines: A complete evaluation includes B-mode imaging, spectral Doppler, color Doppler, and power Doppler as needed of all accessible portions of each vessel. Bilateral testing is considered an integral part of a complete examination. Limited examinations for reoccurring indications may be performed as noted. The reflux portion of the  exam is performed with the patient in reverse Trendelenburg.  +---------+---------------+---------+-----------+----------+--------------+ RIGHT    CompressibilityPhasicitySpontaneityPropertiesThrombus Aging +---------+---------------+---------+-----------+----------+--------------+ CFV      Full           Yes      Yes                                 +---------+---------------+---------+-----------+----------+--------------+  SFJ      Full                                                        +---------+---------------+---------+-----------+----------+--------------+ FV Prox  Full                                                        +---------+---------------+---------+-----------+----------+--------------+ FV Mid   Full                                                        +---------+---------------+---------+-----------+----------+--------------+ FV DistalFull                                                        +---------+---------------+---------+-----------+----------+--------------+ PFV      Full                                                        +---------+---------------+---------+-----------+----------+--------------+ POP      Full           Yes      Yes                                 +---------+---------------+---------+-----------+----------+--------------+ PTV      Full                                                        +---------+---------------+---------+-----------+----------+--------------+ PERO     Full                                                        +---------+---------------+---------+-----------+----------+--------------+   +---------+---------------+---------+-----------+----------+--------------+ LEFT     CompressibilityPhasicitySpontaneityPropertiesThrombus Aging +---------+---------------+---------+-----------+----------+--------------+ CFV      Full           Yes      Yes                                  +---------+---------------+---------+-----------+----------+--------------+ SFJ      Full                                                        +---------+---------------+---------+-----------+----------+--------------+  FV Prox  Full                                                        +---------+---------------+---------+-----------+----------+--------------+ FV Mid   Full                                                        +---------+---------------+---------+-----------+----------+--------------+ FV DistalFull                                                        +---------+---------------+---------+-----------+----------+--------------+ PFV      Full                                                        +---------+---------------+---------+-----------+----------+--------------+ POP      Full           Yes      Yes                                 +---------+---------------+---------+-----------+----------+--------------+ PTV      Full                                                        +---------+---------------+---------+-----------+----------+--------------+ PERO     Full                                                        +---------+---------------+---------+-----------+----------+--------------+     Summary: RIGHT: - There is no evidence of deep vein thrombosis in the lower extremity.  - No cystic structure found in the popliteal fossa.  LEFT: - There is no evidence of deep vein thrombosis in the lower extremity.  - No cystic structure found in the popliteal fossa.  *See table(s) above for measurements and observations. Electronically signed by Orlie Pollen on 07/04/2021 at 5:28:12 PM.    Final    NM Pulmonary Perfusion  Result Date: 07/04/2021 CLINICAL DATA:  Pulmonary embolism suspected, high probability. Developed acute hypoxic respiratory failure. EXAM: NUCLEAR MEDICINE PERFUSION LUNG SCAN TECHNIQUE: Perfusion images  were obtained in multiple projections after intravenous injection of radiopharmaceutical. Ventilation scans intentionally deferred if perfusion scan and chest x-ray adequate for interpretation during COVID 19 epidemic. RADIOPHARMACEUTICALS:  4.2 mCi Tc-10mMAA IV COMPARISON:  Chest radiograph 07/04/2021 FINDINGS: Normal perfusion in both lungs. No significant peripheral or wedge-shaped perfusion abnormalities. IMPRESSION: Normal perfusion examination.  No evidence for pulmonary embolism. Electronically Signed   By:  Markus Daft M.D.   On: 07/04/2021 14:54   US RENAL  Result Date: 07/04/2021 CLINICAL DATA:  Acute kidney injury, history hypertension EXAM: RENAL / URINARY TRACT ULTRASOUND COMPLETE COMPARISON:  01/11/2021 FINDINGS: Right Kidney: Renal measurements: 12.7 x 4.6 x 4.8 cm = volume: 144 mL. Cortical thinning. Normal cortical echogenicity. No mass, hydronephrosis, or shadowing calcification. Left Kidney: Renal measurements: 13.5 x 5.6 x 5.2 cm = volume: 207 mL. Cortical thinning. Normal cortical echogenicity. No mass, hydronephrosis, or shadowing calcification. Bladder: Decompressed by Foley catheter, unable to evaluate. Other: None. IMPRESSION: BILATERAL renal cortical thinning. Otherwise negative exam. Electronically Signed   By: Lavonia Dana M.D.   On: 07/04/2021 10:47   ECHOCARDIOGRAM LIMITED  Result Date: 07/04/2021    ECHOCARDIOGRAM LIMITED REPORT   Patient Name:   Anna Marquez Date of Exam: 07/04/2021 Medical Rec #:  [id number]      Height:       64.0 in Accession #:    [id number]     Weight:       175.0 lb Date of Birth:  06-19-1950      BSA:          1.849 m Patient Age:    49 years       BP:           119/68 mmHg Patient Gender: F              HR:           74 bpm. Exam Location:  Inpatient Procedure: Limited Echo, Color Doppler and Cardiac Doppler STAT ECHO Indications:    Mitral Valve Disorder I05.9  History:        Patient has prior history of Echocardiogram examinations, most                  recent 01/25/2021. Mitral Valve Prolapse, Arrythmias:Atrial                 Fibrillation; Risk Factors:Hypertension.  Sonographer:    Darlina Sicilian RDCS Referring Phys: [id number] [place]  1. Left ventricular ejection fraction, by estimation, is 50 to 55%. The left ventricle has low normal function. The left ventricle has no regional wall motion abnormalities.  2. Right ventricular systolic function is moderately reduced. The right ventricular size is mildly enlarged. There is normal pulmonary artery systolic pressure. The estimated right ventricular systolic pressure is 66.0 mmHg.  3. A small pericardial effusion is present. The pericardial effusion is surrounding the apex and localized near the right ventricle. There is no evidence of cardiac tamponade.  4. PISA 0.2 cm. The mitral valve is normal in structure. Mild to moderate mitral valve regurgitation. No evidence of mitral stenosis.  5. Tricuspid valve regurgitation is mild to moderate.  6. The aortic valve is normal in structure. Aortic valve regurgitation is not visualized. No aortic stenosis is present.  7. The inferior vena cava is dilated in size with >50% respiratory variability, suggesting right atrial pressure of 8 mmHg. Comparison(s): Prior images reviewed side by side. RV size in increased and function reduced when compared to 01/25/2021. FINDINGS  Left Ventricle: Left ventricular ejection fraction, by estimation, is 50 to 55%. The left ventricle has low normal function. The left ventricle has no regional wall motion abnormalities. The left ventricular internal cavity size was normal in size. There is no left ventricular hypertrophy. Right Ventricle: The right ventricular size is mildly enlarged. No increase in right ventricular wall thickness. Right ventricular systolic  function is moderately reduced. There is normal pulmonary artery systolic pressure. The tricuspid regurgitant velocity is 2.48 m/s, and with an assumed right atrial  pressure of 8 mmHg, the estimated right ventricular systolic pressure is 67.1 mmHg. Left Atrium: Left atrial size was normal in size. Right Atrium: Right atrial size was normal in size. Pericardium: A small pericardial effusion is present. The pericardial effusion is surrounding the apex and localized near the right ventricle. There is no evidence of cardiac tamponade. Mitral Valve: PISA 0.2 cm. The mitral valve is normal in structure. Mild mitral annular calcification. Mild to moderate mitral valve regurgitation. No evidence of mitral valve stenosis. Tricuspid Valve: The tricuspid valve is normal in structure. Tricuspid valve regurgitation is mild to moderate. No evidence of tricuspid stenosis. Aortic Valve: The aortic valve is normal in structure. Aortic valve regurgitation is not visualized. No aortic stenosis is present. Pulmonic Valve: The pulmonic valve was normal in structure. Pulmonic valve regurgitation is not visualized. No evidence of pulmonic stenosis. Aorta: The aortic root is normal in size and structure. Venous: The inferior vena cava is dilated in size with greater than 50% respiratory variability, suggesting right atrial pressure of 8 mmHg. IAS/Shunts: No atrial level shunt detected by color flow Doppler. LEFT VENTRICLE PLAX 2D LVOT diam:     2.10 cm LV SV:         28 LV SV Index:   15 LVOT Area:     3.46 cm  LV Volumes (MOD) LV vol d, MOD A2C: 53.6 ml LV vol d, MOD A4C: 61.6 ml LV vol s, MOD A2C: 31.6 ml LV vol s, MOD A4C: 26.4 ml LV SV MOD A2C:     22.0 ml LV SV MOD A4C:     61.6 ml LV SV MOD BP:      29.9 ml AORTIC VALVE LVOT Vmax:   60.70 cm/s LVOT Vmean:  30.900 cm/s LVOT VTI:    0.080 m MITRAL VALVE                  TRICUSPID VALVE MV Area (PHT): 2.50 cm       TR Peak grad:   24.6 mmHg MV Decel Time: 304 msec       TR Vmax:        248.00 cm/s MR Peak grad:    85.7 mmHg MR Mean grad:    49.0 mmHg    SHUNTS MR Vmax:         463.00 cm/s  Systemic VTI:  0.08 m MR Vmean:        322.0 cm/s    Systemic Diam: 2.10 cm MR PISA:         0.25 cm MR PISA Eff ROA: 1 mm MR PISA Radius:  0.20 cm MV E velocity: 47.60 cm/s Candee Furbish MD Electronically signed by Candee Furbish MD Signature Date/Time: 07/04/2021/10:18:04 AM    Final    DG Chest Port 1 View  Result Date: 07/04/2021 CLINICAL DATA:  ARDS, shortness of breath EXAM: PORTABLE CHEST 1 VIEW COMPARISON:  Chest radiograph 1 day prior FINDINGS: The endotracheal tube tip is approximately 2.7 cm from the carina. The left IJ vascular catheter terminates in the lower SVC. The enteric catheter is coiled in the stomach The cardiomediastinal silhouette is stable. There is persistent retrocardiac consolidation. Opacities in the right base and perihilar regions are improved in the interim. There is no significant pleural effusion. There is no pneumothorax The bones are stable. IMPRESSION: Persistent retrocardiac consolidation but  improved opacities in the right base and perihilar regions. No significant pleural effusion. Electronically Signed   By: Valetta Mole M.D.   On: 07/04/2021 08:09   Portable Chest x-ray  Result Date: 07/03/2021 CLINICAL DATA:  Provided history: ET tube placement. EXAM: PORTABLE CHEST 1 VIEW COMPARISON:  Prior chest radiographs 07/02/2021 and earlier. FINDINGS: Interval intubation. The ET tube terminates 2.3 cm above the level of the carina. Interval placement of a left IJ approach central venous catheter with tip projecting at the level of the superior cavoatrial junction. Partially visualized enteric tube, passing below the level of the left hemidiaphragm. The tip of the enteric tube appears to re-enter the field of view and terminate in the region of the gastric cardia. Borderline cardiomegaly. Bilateral interstitial and airspace opacities, greatest within the left mid lung field and right mid to lower lung field, progressed. No evidence of pleural effusion or pneumothorax. No acute bony abnormality identified. IMPRESSION: Positions of  lines and tubes, as described. Bilateral interstitial and airspace opacities, progressed from the prior chest radiograph of 07/02/2021. This may reflect pulmonary edema or pneumonia. Borderline cardiomegaly. Electronically Signed   By: Kellie Simmering D.O.   On: 07/03/2021 15:11   DG Abd 1 View  Result Date: 07/03/2021 CLINICAL DATA:  Orogastric tube placement EXAM: ABDOMEN - 1 VIEW COMPARISON:  Portable exam 1459 hours compared 09/01/2020 FINDINGS: Nasogastric tube coiled in proximal stomach. Surgical clips RIGHT upper quadrant likely reflect prior cholecystectomy. Patchy infiltrates mid to lower LEFT lung with mild RIGHT basilar atelectasis. IMPRESSION: Nasogastric tube coiled in proximal stomach. Electronically Signed   By: Lavonia Dana M.D.   On: 07/03/2021 15:09    Cardiac Studies  TTE 07/04/21: IMPRESSIONS     1. Left ventricular ejection fraction, by estimation, is 50 to 55%. The  left ventricle has low normal function. The left ventricle has no regional  wall motion abnormalities.   2. Right ventricular systolic function is moderately reduced. The right  ventricular size is mildly enlarged. There is normal pulmonary artery  systolic pressure. The estimated right ventricular systolic pressure is  87.5 mmHg.   3. A small pericardial effusion is present. The pericardial effusion is  surrounding the apex and localized near the right ventricle. There is no  evidence of cardiac tamponade.   4. PISA 0.2 cm. The mitral valve is normal in structure. Mild to moderate  mitral valve regurgitation. No evidence of mitral stenosis.   5. Tricuspid valve regurgitation is mild to moderate.   6. The aortic valve is normal in structure. Aortic valve regurgitation is  not visualized. No aortic stenosis is present.   7. The inferior vena cava is dilated in size with >50% respiratory  variability, suggesting right atrial pressure of 8 mmHg.   Comparison(s): Prior images reviewed side by side. RV size in  increased  and function reduced when compared to 01/25/2021.   V/Q 07/04/21: FINDINGS: Normal perfusion in both lungs. No significant peripheral or wedge-shaped perfusion abnormalities.   IMPRESSION: Normal perfusion examination.  No evidence for pulmonary embolism.   Echo 01/25/21 1. Left ventricular ejection fraction, by estimation, is 60 to 65%. The  left ventricle has normal function. The left ventricle has no regional  wall motion abnormalities. There is mild left ventricular hypertrophy.  Left ventricular diastolic parameters  are consistent with Grade I diastolic dysfunction (impaired relaxation).   2. Right ventricular systolic function is normal. The right ventricular  size is mildly enlarged.   3. Left atrial size  was moderately dilated.   4. Right atrial size was mildly dilated.   5. The mitral valve is normal in structure. Mild to moderate mitral valve  regurgitation. No evidence of mitral stenosis.   6. The aortic valve is normal in structure. Aortic valve regurgitation is  trivial. No aortic stenosis is present.   7. The inferior vena cava is normal in size with greater than 50%  respiratory variability, suggesting right atrial pressure of 3 mmHg.   Patient Profile     71 y.o. female with history of paroxysmal Afib s/p ablation (had previously failed tikosyn), HTN, asthma, mild-to-moderate MR, and GERD who presented to the ER with worsening SOB found to be in Afib with RVR and overloaded on exam for which Cardiology was consulted.Course complicated by acute hypoxic respiratory failure requiring intubation and transfer to ICU.   Assessment & Plan    #Acute Hypoxic Respiratory Failure: #Suspected Flash Pulmonary Edema: #Acute on Chronic Diastolic HF: #RV Dysfunction: Patient presented with worsening dyspnea with BNP 700 and CXR with pulmonary edema concerning for acute on chronic diastolic HF exacerbation in the setting of Afib with RVR. Her course was complicated by  acute respiratory failure ultimately requiring intubation on 07/03/21. TTE with LVEF 50-55% with mild-moderate MR. RV is mildly enlarged and moderately hypokinetic. V/Q scan negative. Overall, acute decompensation may have been related to poorly tolerated Afib with RVR with flash pulmonary edema leading to acute respiratory failure. Obtaining bedside TEE to ensure no significant MR. May also have intermittent ischemic MR and ultimately patient will need cath once renal function improves.  -Plan for TEE/DCCV today  -Appears mildly volume up, recommend resuming lasix post TEE as able -PE work-up unrevealing; possibly RV dysfunction related to poorly tolerated Afib and acute respiratory failure -Likely needs cath once renal function returns to baseline to ensure no ischemia (? Intermittent ischemic MR) -Vent management per ICU; likely extubate today following TEE  #Afib with RVR: Back in Afib this morning with HR 110-120s. Plan for DCCV following TEE if no evidence of LAA thrombus. -Plan for TEE/DCCV at bedside today -Continue amiodarone gtt for now -Likely will need ongoing amiodarone PO as she does not tolerate Afib and has failed other antiarrhythmics in the past -Will need close monitoring of her thyroid function given underlying Afib and hyperthyroidism (not related to amiodarone) -May need repeat ablation; will arrange for EP follow-up as outpatient  #Hypotension: Likely related to sedation. Procal elevated but no localizing symptoms of infection.  -Management per PCCM  #AKI on CKD: Improving. Appears mildly volume up today. Will likely need to resume diuresis following TEE as able.  #Hyperthyroidism: Recently diagnosed. Not related to amiodarone. On methimazole at home.  #Mild to moderate MR: Does not appear severe on surface TTE but will obtain TEE as above to ensure it was not underestimated. Given intermittent nature of SOB, there is a possibility of ischemic MR. Likely needs cath  once renal function back to baseline.  CRITICAL CARE TIME: I have spent a total of 45 minutes with patient reviewing hospital notes, telemetry, EKGs, labs and examining the patient as well as establishing an assessment and plan that was discussed with the patient.  > 50% of time was spent in direct patient care. The patient is critically ill with multi-organ system failure and requires high complexity decision making for assessment and support, frequent evaluation and titration of therapies, application of advanced monitoring technologies and extensive interpretation of multiple databases.     Plan discussed with  family at bedside.  For questions or updates, please contact [place] Please consult [email or web address] for contact info under        Signed, Freada Bergeron, MD  07/05/2021, 10:55 AM

## 2021-07-05 ENCOUNTER — Inpatient Hospital Stay (HOSPITAL_COMMUNITY): Payer: PPO

## 2021-07-05 DIAGNOSIS — I340 Nonrheumatic mitral (valve) insufficiency: Secondary | ICD-10-CM

## 2021-07-05 DIAGNOSIS — I4891 Unspecified atrial fibrillation: Secondary | ICD-10-CM | POA: Diagnosis not present

## 2021-07-05 DIAGNOSIS — I4819 Other persistent atrial fibrillation: Secondary | ICD-10-CM | POA: Diagnosis not present

## 2021-07-05 DIAGNOSIS — I10 Essential (primary) hypertension: Secondary | ICD-10-CM | POA: Diagnosis not present

## 2021-07-05 DIAGNOSIS — I5033 Acute on chronic diastolic (congestive) heart failure: Secondary | ICD-10-CM | POA: Diagnosis not present

## 2021-07-05 DIAGNOSIS — N179 Acute kidney failure, unspecified: Secondary | ICD-10-CM | POA: Diagnosis not present

## 2021-07-05 LAB — CBC
HCT: 27.5 % — ABNORMAL LOW (ref 36.0–46.0)
Hemoglobin: 9.5 g/dL — ABNORMAL LOW (ref 12.0–15.0)
MCH: 30.2 pg (ref 26.0–34.0)
MCHC: 34.5 g/dL (ref 30.0–36.0)
MCV: 87.3 fL (ref 80.0–100.0)
Platelets: 250 10*3/uL (ref 150–400)
RBC: 3.15 MIL/uL — ABNORMAL LOW (ref 3.87–5.11)
RDW: 14.4 % (ref 11.5–15.5)
WBC: 9.5 10*3/uL (ref 4.0–10.5)
nRBC: 0.5 % — ABNORMAL HIGH (ref 0.0–0.2)

## 2021-07-05 LAB — ECHO TEE
MV M vel: 4.33 m/s
MV Peak grad: 75 mmHg
Radius: 0.7 cm

## 2021-07-05 LAB — HEPATIC FUNCTION PANEL
ALT: 556 U/L — ABNORMAL HIGH (ref 0–44)
AST: 291 U/L — ABNORMAL HIGH (ref 15–41)
Albumin: 2.5 g/dL — ABNORMAL LOW (ref 3.5–5.0)
Alkaline Phosphatase: 72 U/L (ref 38–126)
Bilirubin, Direct: 0.3 mg/dL — ABNORMAL HIGH (ref 0.0–0.2)
Indirect Bilirubin: 1 mg/dL — ABNORMAL HIGH (ref 0.3–0.9)
Total Bilirubin: 1.3 mg/dL — ABNORMAL HIGH (ref 0.3–1.2)
Total Protein: 5.9 g/dL — ABNORMAL LOW (ref 6.5–8.1)

## 2021-07-05 LAB — BASIC METABOLIC PANEL
Anion gap: 15 (ref 5–15)
Anion gap: 13 (ref 5–15)
BUN: 46 mg/dL — ABNORMAL HIGH (ref 8–23)
BUN: 40 mg/dL — ABNORMAL HIGH (ref 8–23)
CO2: 24 mmol/L (ref 22–32)
CO2: 25 mmol/L (ref 22–32)
Calcium: 7.9 mg/dL — ABNORMAL LOW (ref 8.9–10.3)
Calcium: 8.6 mg/dL — ABNORMAL LOW (ref 8.9–10.3)
Chloride: 100 mmol/L (ref 98–111)
Chloride: 103 mmol/L (ref 98–111)
Creatinine, Ser: 1.67 mg/dL — ABNORMAL HIGH (ref 0.44–1.00)
Creatinine, Ser: 1.24 mg/dL — ABNORMAL HIGH (ref 0.44–1.00)
GFR, Estimated: 32 mL/min — ABNORMAL LOW (ref >60)
GFR, Estimated: 46 mL/min — ABNORMAL LOW (ref >60)
Glucose, Bld: 220 mg/dL — ABNORMAL HIGH (ref 70–99)
Glucose, Bld: 240 mg/dL — ABNORMAL HIGH (ref 70–99)
Potassium: 2.7 mmol/L — CL (ref 3.5–5.1)
Potassium: 3.7 mmol/L (ref 3.5–5.1)
Sodium: 139 mmol/L (ref 135–145)
Sodium: 141 mmol/L (ref 135–145)

## 2021-07-05 LAB — GLUCOSE, CAPILLARY
Glucose-Capillary: 116 mg/dL — ABNORMAL HIGH (ref 70–99)
Glucose-Capillary: 123 mg/dL — ABNORMAL HIGH (ref 70–99)
Glucose-Capillary: 174 mg/dL — ABNORMAL HIGH (ref 70–99)
Glucose-Capillary: 201 mg/dL — ABNORMAL HIGH (ref 70–99)
Glucose-Capillary: 216 mg/dL — ABNORMAL HIGH (ref 70–99)
Glucose-Capillary: 225 mg/dL — ABNORMAL HIGH (ref 70–99)
Glucose-Capillary: 255 mg/dL — ABNORMAL HIGH (ref 70–99)

## 2021-07-05 LAB — PHOSPHORUS: Phosphorus: 3.1 mg/dL (ref 2.5–4.6)

## 2021-07-05 LAB — TSH: TSH: 0.264 u[IU]/mL — ABNORMAL LOW (ref 0.350–4.500)

## 2021-07-05 LAB — TRIGLYCERIDES: Triglycerides: 503 mg/dL — ABNORMAL HIGH (ref <150)

## 2021-07-05 LAB — MAGNESIUM: Magnesium: 1.8 mg/dL (ref 1.7–2.4)

## 2021-07-05 LAB — T4, FREE: Free T4: 1.39 ng/dL — ABNORMAL HIGH (ref 0.61–1.12)

## 2021-07-05 LAB — LIPASE, BLOOD: Lipase: 42 U/L (ref 11–51)

## 2021-07-05 MED ORDER — POTASSIUM CHLORIDE 20 MEQ PO PACK
40.0000 meq | PACK | Freq: Once | ORAL | Status: AC
Start: 2021-07-05 — End: 2021-07-05
  Administered 2021-07-05: 40 meq
  Filled 2021-07-05: qty 2

## 2021-07-05 MED ORDER — APIXABAN 5 MG PO TABS
5.0000 mg | ORAL_TABLET | Freq: Two times a day (BID) | ORAL | Status: DC
  Administered 2021-07-05: 5 mg
  Filled 2021-07-05 (×2): qty 1

## 2021-07-05 MED ORDER — AMIODARONE IV BOLUS ONLY 150 MG/100ML: 150.0000 mg | Freq: Once | INTRAVENOUS | Status: DC

## 2021-07-05 MED ORDER — MAGNESIUM SULFATE 2 GM/50ML IV SOLN
2.0000 g | Freq: Once | INTRAVENOUS | Status: AC
  Administered 2021-07-05: 2 g via INTRAVENOUS
  Filled 2021-07-05: qty 50

## 2021-07-05 MED ORDER — ENOXAPARIN SODIUM 80 MG/0.8ML IJ SOSY
1.0000 mg/kg | PREFILLED_SYRINGE | Freq: Once | INTRAMUSCULAR | Status: AC
Start: 2021-07-05 — End: 2021-07-05
  Administered 2021-07-05: 77.5 mg via SUBCUTANEOUS
  Filled 2021-07-05: qty 0.78

## 2021-07-05 MED ORDER — FUROSEMIDE 10 MG/ML IJ SOLN: 60.0000 mg | Freq: Once | INTRAMUSCULAR | Status: DC

## 2021-07-05 MED ORDER — POTASSIUM CHLORIDE 10 MEQ/50ML IV SOLN
10.0000 meq | INTRAVENOUS | Status: AC
  Administered 2021-07-05 (×2): 10 meq via INTRAVENOUS
  Filled 2021-07-05 (×4): qty 50

## 2021-07-05 MED ORDER — ETOMIDATE 2 MG/ML IV SOLN: 20.0000 mg | Freq: Once | INTRAVENOUS | Status: AC

## 2021-07-05 MED ORDER — INSULIN ASPART 100 UNIT/ML IJ SOLN
0.0000 [IU] | INTRAMUSCULAR | Status: DC
  Administered 2021-07-05: 2 [IU] via SUBCUTANEOUS
  Administered 2021-07-05 (×2): 5 [IU] via SUBCUTANEOUS
  Administered 2021-07-05: 8 [IU] via SUBCUTANEOUS
  Administered 2021-07-06: 2 [IU] via SUBCUTANEOUS
  Administered 2021-07-06: 3 [IU] via SUBCUTANEOUS
  Administered 2021-07-07 (×2): 2 [IU] via SUBCUTANEOUS
  Administered 2021-07-07: 3 [IU] via SUBCUTANEOUS
  Administered 2021-07-08 (×2): 2 [IU] via SUBCUTANEOUS

## 2021-07-05 MED ORDER — ETOMIDATE 2 MG/ML IV SOLN
INTRAVENOUS | Status: AC
  Administered 2021-07-05: 20 mg via INTRAVENOUS
  Filled 2021-07-05: qty 10

## 2021-07-05 MED ORDER — AMIODARONE LOAD VIA INFUSION
150.0000 mg | Freq: Once | INTRAVENOUS | Status: AC
  Administered 2021-07-05: 150 mg via INTRAVENOUS
  Filled 2021-07-05: qty 83.34

## 2021-07-05 MED ORDER — ETOMIDATE 2 MG/ML IV SOLN
10.0000 mg | Freq: Once | INTRAVENOUS | Status: DC
Start: 2021-07-05 — End: 2021-07-05

## 2021-07-05 MED ORDER — POTASSIUM CHLORIDE 10 MEQ/50ML IV SOLN
10.0000 meq | INTRAVENOUS | Status: AC
  Administered 2021-07-05 (×2): 10 meq via INTRAVENOUS

## 2021-07-05 MED ORDER — POTASSIUM CHLORIDE 20 MEQ PO PACK
40.0000 meq | PACK | Freq: Once | ORAL | Status: DC
Start: 2021-07-05 — End: 2021-07-05
  Filled 2021-07-05: qty 2

## 2021-07-05 MED ORDER — POTASSIUM CHLORIDE 20 MEQ PO PACK
40.0000 meq | PACK | Freq: Once | ORAL | Status: DC
Start: 2021-07-05 — End: 2021-07-05

## 2021-07-05 MED ORDER — POTASSIUM CHLORIDE 10 MEQ/50ML IV SOLN
10.0000 meq | INTRAVENOUS | Status: AC
  Administered 2021-07-05 (×4): 10 meq via INTRAVENOUS
  Filled 2021-07-05 (×4): qty 50

## 2021-07-05 MED ORDER — FUROSEMIDE 10 MG/ML IJ SOLN
60.0000 mg | Freq: Once | INTRAMUSCULAR | Status: AC
Start: 2021-07-05 — End: 2021-07-05
  Administered 2021-07-05: 60 mg via INTRAVENOUS
  Filled 2021-07-05: qty 6

## 2021-07-05 MED ORDER — AMIODARONE LOAD VIA INFUSION
150.0000 mg | Freq: Once | INTRAVENOUS | Status: AC
Start: 2021-07-05 — End: 2021-07-05
  Administered 2021-07-05: 150 mg via INTRAVENOUS
  Filled 2021-07-05: qty 83.34

## 2021-07-05 NOTE — Interval H&P Note (Signed)
History and Physical Interval Note:  07/05/2021 11:21 AM  Anna Marquez  has presented today for surgery, with the diagnosis of * No surgery found *.  The various methods of treatment have been discussed with the patient and family. After consideration of risks, benefits and other options for treatment, the patient has consented to  * No surgery found * as a surgical intervention.  The patient's history has been reviewed, patient examined, no change in status, stable for surgery.  I have reviewed the patient's chart and labs.  Questions were answered to the patient's satisfaction.     [name] [employer]

## 2021-07-05 NOTE — Consult Note (Signed)
Advanced Heart Failure Team Consult Note   Primary Physician: [name], DO PCP-Cardiologist:  Mertie Moores, MD  Reason for Consultation: Mitral regurgitation, flash pulmonary edema  HPI:    Anna Marquez is seen today for evaluation of CHF/MR at the request of Dr. Tamala Julian.   71 y.o. with history of diastolic CHF, paroxysmal atrial fibrillation, and recently diagnosed hyperthyroidism was admitted with atrial fibrillation/RVR and acute on chronic diastolic CHF.  Patient was on Tikosyn several years ago for AF then had ablation in 2/21 by Dr. Rayann Heman.  Tikosyn was later stopped.  Recently, she has re-developed episodes of atrial fibrillation.  She also, of noted, has been found to have hyperthyroidism likely related to multinodular goiter.  She was admitted in 5/23 with acute respiratory failure in setting of atrial fibrillation and CHF, there was some question of PNA.  She spontaneously converted to NSR.  At office visit subsequently on 06/19/21, she remained in NSR.    She was admitted again on 6/27 with dyspnea.  She reported 2 days of dyspnea prior to admission.  In ER, she was in atrial fibrillation with RVR.  She developed progressive respiratory distress.  Amiodarone gtt was started and she was given Lasix.  She was ultimately intubated.  She developed hypotension and required NE + vasopressin with elevated lactate.  She developed AKI in setting of hypotension with creatinine up to 2.93, now back down to 1.67.  Currently weaned down to NE at 1 and off vasopressin.  She is in atrial fibrillation on amiodarone gtt at 30.  TEE today showed normal LV size with EF 60-65%, mild RV dysfunction, severe LAE with moderate-severe central MR (likely atrial functional MR).  There was no LA appendage thrombus.  She has been on apixaban.  DCCV was attempted x 2, both times she went back into NSR but promptly reverted to AF.  CVP today is about 12  Patient, of note, had V/Q scan that was negative. PCT  was high at 17.17 and WBCs as high as 19, but was not thought to be infected clinically and now afebrile with WBCs down to 9.5 without antibiotics. Also of note, in the setting of hypotension/CHF LFTs rose, now trending down.    Review of Systems: All systems reviewed and negative except as per HPI.   Home Medications Prior to Admission medications   Medication Sig Start Date End Date Taking? Authorizing Provider  acetaminophen (TYLENOL) 500 MG tablet Take 1,000 mg by mouth every 6 (six) hours as needed for moderate pain or headache.   Yes [provider]  albuterol (PROVENTIL) (2.5 MG/3ML) 0.083% nebulizer solution Take 3 mLs (2.5 mg total) by nebulization every 4 (four) hours as needed for wheezing or shortness of breath (please include nebulizer machine, hoses, and mask if needed.). 05/09/21  Yes Breeback, Jade L, PA-C  albuterol (VENTOLIN HFA) 108 (90 Base) MCG/ACT inhaler INHALE 1 TO 2 PUFFS INTO THE LUNGS EVERY 6 HOURS AS NEEDED FOR WHEEZING OR SHORTNESS OF BREATH. Patient taking differently: Inhale 1-2 puffs into the lungs every 6 (six) hours as needed for wheezing or shortness of breath. 03/08/21  Yes [name], DO  Ascorbic Acid (VITAMIN C) 1000 MG tablet Take 1,000 mg by mouth daily.   Yes [provider]  chlorpheniramine-HYDROcodone (TUSSIONEX PENNKINETIC ER) 10-8 MG/5ML Take 5 mLs by mouth every 12 (twelve) hours as needed for cough. 07/01/21  Yes Raylene Everts, MD  Cholecalciferol (VITAMIN D) 50 MCG (2000 UT) tablet Take  2,000 Units by mouth daily.   Yes [provider]  diclofenac Sodium (VOLTAREN) 1 % GEL Apply 2-4 g topically 4 (four) times daily as needed (knees,ankles and hand pain).   Yes [provider]  diltiazem (CARDIZEM CD) 180 MG 24 hr capsule Take 1 capsule (180 mg total) by mouth daily. 06/14/21  Yes Matthews, Cody, DO  ELIQUIS 5 MG TABS tablet TAKE 1 TABLET BY MOUTH 2 TIMES DAILY. Patient taking differently: Take 5 mg by mouth 2  (two) times daily. 06/05/21  Yes Nahser, Wonda Cheng, MD  ferrous sulfate 325 (65 FE) MG tablet Take 325 mg by mouth every other day.   Yes [provider]  fluticasone (FLONASE) 50 MCG/ACT nasal spray PLACE 1 SPRAY INTO BOTH NOSTRILS AS NEEDED FOR ALLERGIES OR RHINITIS. Patient taking differently: Place 1 spray into both nostrils daily as needed for allergies or rhinitis. 03/07/20  Yes [name], DO  furosemide (LASIX) 20 MG tablet TAKE 1 TABLET (20 MG TOTAL) BY MOUTH DAILY AS NEEDED FOR EDEMA. 02/14/21  Yes Shirley Friar, PA-C  levofloxacin (LEVAQUIN) 500 MG tablet Take 1 tablet (500 mg total) by mouth daily. Patient taking differently: Take 500 mg by mouth See admin instructions. Qd x 7 days 06/27/21  Yes Olalere, Adewale A, MD  loratadine (CLARITIN) 10 MG tablet Take 10 mg by mouth daily.    Yes [provider]  methimazole (TAPAZOLE) 5 MG tablet Take 1 tablet (5 mg total) by mouth 3 (three) times daily. 06/09/21  Yes Danford, Suann Larry, MD  nadolol (CORGARD) 40 MG tablet Take 1 tablet (40 mg total) by mouth 2 (two) times daily. 07/12/20  Yes Allred, Jeneen Rinks, MD  pantoprazole (PROTONIX) 40 MG tablet TAKE 1 TABLET (40 MG TOTAL) BY MOUTH DAILY. 02/14/21  Yes [name], DO  potassium chloride (KLOR-CON) 10 MEQ tablet TAKE 1 TABLET BY MOUTH DAILY AS NEEDED (TAKE WITH HCTZ). Patient taking differently: Take 10 mEq by mouth daily as needed (when taking furosemide). 02/14/21  Yes Nahser, Wonda Cheng, MD  prednisoLONE acetate (PRED FORTE) 1 % ophthalmic suspension Place 1 drop into the left eye 4 (four) times daily. 06/20/21  Yes [provider]  predniSONE (DELTASONE) 20 MG tablet Take 2 tablets (40 mg total) by mouth daily with breakfast. Patient taking differently: Take 40 mg by mouth See admin instructions. Qd x 5 days 07/01/21  Yes Raylene Everts, MD  Probiotic Product (PROBIOTIC PO) Take 1 capsule by mouth daily.   Yes [provider]  vitamin E 180 MG (400  UNITS) capsule Take 400 Units by mouth daily.   Yes [provider]  ZINC CITRATE PO Take 22 mg by mouth daily.   Yes [provider]  erythromycin ophthalmic ointment Place 1 application. into the left eye 4 (four) times daily. Patient not taking: Reported on 07/03/2021 06/14/21   [name], DO    Past Medical History: 1. Cholecystectomy 2. HTN 3. Atrial fibrillation: Paroxysmal.  Had been on Tikosyn in the past, then had AF ablation in 2/21 by Dr. Rayann Heman and Phyllis Ginger was stopped.   4. Hyperthyroidism: Multinodular goiter.  5. Chronic diastolic CHF: Echo (6/83) with E 60-65%, mild LVH, normal RV, mild-moderate MR.  - Echo (6/23): E 50-55%, ?moderate MR, moderately decreased RV systolic function.   Past Surgical History: Past Surgical History:  Procedure Laterality Date   ANTERIOR AND POSTERIOR REPAIR N/A 06/14/2014   Procedure: ANTERIOR (CYSTOCELE) AND POSTERIOR REPAIR (RECTOCELE);  Surgeon: Anastasio Auerbach,  MD;  Location: [facility];  Service: Gynecology;  Laterality: N/A;   ATRIAL FIBRILLATION ABLATION N/A 02/11/2019   Procedure: ATRIAL FIBRILLATION ABLATION;  Surgeon: Thompson Grayer, MD;  Location: [facility];  Service: Cardiovascular;  Laterality: N/A;   CARDIOVERSION N/A 01/15/2019   Procedure: CARDIOVERSION;  Surgeon: Fay Records, MD;  Location: [facility];  Service: Cardiovascular;  Laterality: N/A;   CHOLECYSTECTOMY     kidney stone removal     x2   VAGINAL HYSTERECTOMY N/A 06/14/2014   Procedure: HYSTERECTOMY VAGINAL;  Surgeon: Anastasio Auerbach, MD;  Location: [facility];  Service: Gynecology;  Laterality: N/A;    Family History: Family History  Problem Relation Age of Onset   Cancer Father        prostate   Breast cancer Paternal Aunt 28   Diabetes Maternal Grandmother    Heart disease Maternal Grandmother    Heart disease Maternal Grandfather    Melanoma Paternal Aunt    Neuropathy Mother    Congestive Heart Failure Mother    Thyroid disease  Daughter     Social History: Social History   Socioeconomic History   Marital status: Divorced    Spouse name: Not on file   Number of children: 2   Years of education: 12th grade   Highest education level: High school graduate  Occupational History   Occupation: Retired,  Tobacco Use   Smoking status: Never   Smokeless tobacco: Never  Vaping Use   Vaping Use: Never used  Substance and Sexual Activity   Alcohol use: Never    Alcohol/week: 0.0 standard drinks of alcohol   Drug use: No   Sexual activity: Yes    Birth control/protection: Post-menopausal    Comment: 1st intercourse 71 yo-Fewer than 5 partners  Other Topics Concern   Not on file  Social History Narrative   Lives along with her dog. She enjoys croteching, crafts, making wreaths, reading and gardening in her free time.    Social Determinants of Health   Financial Resource Strain: Low Risk  (03/26/2021)   Overall Financial Resource Strain (CARDIA)    Difficulty of Paying Living Expenses: Not hard at all  Food Insecurity: No Food Insecurity (03/26/2021)   Hunger Vital Sign    Worried About Running Out of Food in the Last Year: Never true    Ran Out of Food in the Last Year: Never true  Transportation Needs: No Transportation Needs (03/26/2021)   PRAPARE - Hydrologist (Medical): No    Lack of Transportation (Non-Medical): No  Physical Activity: Sufficiently Active (03/26/2021)   Exercise Vital Sign    Days of Exercise per Week: 3 days    Minutes of Exercise per Session: 120 min  Stress: No Stress Concern Present (03/26/2021)   [place]    Feeling of Stress : Not at all  Social Connections: Moderately Isolated (03/26/2021)   Social Connection and Isolation Panel [NHANES]    Frequency of Communication with Friends and Family: More than three times a week    Frequency of Social Gatherings with Friends and Family: More  than three times a week    Attends Religious Services: More than 4 times per year    Active Member of [employer] or Organizations: No    Attends Archivist Meetings: Never    Marital Status: Divorced    Allergies:  Allergies  Allergen Reactions   Erythromycin  Upset stomach   Nsaids Other (See Comments)    Has a history of bleeding ulcers, tolerates aspirin    Penicillins Hives and Shortness Of Breath    Did it involve swelling of the face/tongue/throat, SOB, or low BP? Yes Did it involve sudden or severe rash/hives, skin peeling, or any reaction on the inside of your mouth or nose? No Did you need to seek medical attention at a hospital or doctor's office? No When did it last happen?      45 years If all above answers are "NO", may proceed with cephalosporin use.     Objective:    Vital Signs:   Temp:  [97.7 F (36.5 C)-99.5 F (37.5 C)] 99.5 F (37.5 C) (06/29 1056) Pulse Rate:  [65-110] 110 (06/29 1100) Resp:  [14-24] 22 (06/29 1100) BP: (87-132)/(65-96) 112/65 (06/29 0818) SpO2:  [92 %-99 %] 97 % (06/29 1100) Arterial Line BP: (82-149)/(46-85) 124/73 (06/29 1100) FiO2 (%):  [40 %-50 %] 40 % (06/29 0818) Weight:  [77.9 kg] 77.9 kg (06/29 0500) Last BM Date : 07/04/21  Weight change: Filed Weights   07/03/21 1720 07/04/21 0444 07/05/21 0500  Weight: 81.5 kg 79.4 kg 77.9 kg    Intake/Output:   Intake/Output Summary (Last 24 hours) at 07/05/2021 1158 Last data filed at 07/05/2021 1149 Gross per 24 hour  Intake 4109.96 ml  Output 2725 ml  Net 1384.96 ml      Physical Exam    General:  Intubated/sedated.  HEENT: normal Neck: supple. JVP 10-12. Carotids 2+ bilat; no bruits. No lymphadenopathy or thyromegaly appreciated. Cor: PMI nondisplaced. Mildly tachy, irregular rate & rhythm. 2/6 HSM apex.  Lungs: Decreased at bases.  Abdomen: soft, nontender, nondistended. No hepatosplenomegaly. No bruits or masses. Good bowel sounds. Extremities: no cyanosis,  clubbing, rash, edema Neuro:Sedated on vent.    Telemetry   Atrial fibrillation rate 100s (personally reviewed)  EKG    Atrial fibrillation, nonspecific T wave changes (personally reviewed)  Labs   Basic Metabolic Panel: Recent Labs  Lab 07/02/21 2329 07/03/21 1455 07/03/21 1530 07/03/21 2325 07/04/21 0412 07/05/21 0100  NA 135 138 142 139 139 139  K 3.4* 3.8 3.1* 4.0 4.3 2.7*  CL 105  --  105  --  106 100  CO2 15*  --  18*  --  16* 24  GLUCOSE 299*  --  185*  --  119* 220*  BUN 27*  --  38*  --  47* 46*  CREATININE 1.58*  --  2.48*  --  2.93* 1.67*  CALCIUM 9.4  --  9.1  --  8.2* 7.9*  MG  --   --   --   --  2.1 1.8  PHOS  --   --   --   --   --  3.1    Liver Function Tests: Recent Labs  Lab 07/02/21 2329 07/03/21 1530 07/04/21 0412 07/05/21 0100  AST 28 219* 982* 291*  ALT 45* 199* 733* 556*  ALKPHOS 85 87 85 72  BILITOT 1.4* 2.1* 1.9* 1.3*  PROT 6.4* 6.6 6.2* 5.9*  ALBUMIN 3.0* 2.9* 2.8* 2.5*   Recent Labs  Lab 07/02/21 2329 07/05/21 0100  LIPASE 42 42   No results for input(s): "AMMONIA" in the last 168 hours.  CBC: Recent Labs  Lab 07/02/21 2329 07/03/21 1455 07/03/21 2325 07/04/21 0412 07/05/21 0100  WBC 12.9*  --   --  19.0* 9.5  HGB 10.9* 12.9 10.9* 11.1* 9.5*  HCT 32.3*  38.0 32.0* 33.5* 27.5*  MCV 90.2  --   --  89.6 87.3  PLT 330  --   --  405* 250    Cardiac Enzymes: No results for input(s): "CKTOTAL", "CKMB", "CKMBINDEX", "TROPONINI" in the last 168 hours.  BNP: BNP (last 3 results) Recent Labs    06/05/21 0654 07/02/21 2329  BNP 81.9 699.7*    ProBNP (last 3 results) No results for input(s): "PROBNP" in the last 8760 hours.   CBG: Recent Labs  Lab 07/04/21 2141 07/05/21 0059 07/05/21 0355 07/05/21 0736 07/05/21 1054  GLUCAP 191* 174* 216* 225* 255*    Coagulation Studies: No results for input(s): "LABPROT", "INR" in the last 72 hours.   Imaging   US Abdomen Limited RUQ (LIVER/GB)  Result Date:  07/05/2021 CLINICAL DATA:  Abnormal liver function tests EXAM: ULTRASOUND ABDOMEN LIMITED RIGHT UPPER QUADRANT COMPARISON:  None Available. FINDINGS: Gallbladder: Gallbladder is not seen consistent with cholecystectomy. Common bile duct: Diameter: 7.5 mm. There is no significant dilation of intrahepatic bile ducts. Liver: There is slightly increased echogenicity in the liver. There is mild nodularity in the liver surface. No focal abnormality is seen in the visualized portions of liver. Portal vein is patent on color Doppler imaging with normal direction of blood flow towards the liver. Other: Trace amount of fluid is noted adjacent to the right kidney. IMPRESSION: Status post cholecystectomy. There is prominence of proximal common bile duct possibly due to post cholecystectomy state. Increased echogenicity in the liver suggests fatty infiltration. There is mild nodularity in the liver surface suggesting possible cirrhosis. Electronically Signed   By: Elmer Picker M.D.   On: 07/05/2021 10:02   NM Pulmonary Perfusion  Result Date: 07/04/2021 CLINICAL DATA:  Pulmonary embolism suspected, high probability. Developed acute hypoxic respiratory failure. EXAM: NUCLEAR MEDICINE PERFUSION LUNG SCAN TECHNIQUE: Perfusion images were obtained in multiple projections after intravenous injection of radiopharmaceutical. Ventilation scans intentionally deferred if perfusion scan and chest x-ray adequate for interpretation during COVID 19 epidemic. RADIOPHARMACEUTICALS:  4.2 mCi Tc-15mMAA IV COMPARISON:  Chest radiograph 07/04/2021 FINDINGS: Normal perfusion in both lungs. No significant peripheral or wedge-shaped perfusion abnormalities. IMPRESSION: Normal perfusion examination.  No evidence for pulmonary embolism. Electronically Signed   By: AMarkus DaftM.D.   On: 07/04/2021 14:54     Medications:     Current Medications:  amiodarone  150 mg Intravenous Once   apixaban  5 mg Per Tube BID   Chlorhexidine  Gluconate Cloth  6 each Topical Daily   docusate  100 mg Per Tube BID   furosemide  60 mg Intravenous Once   insulin aspart  0-15 Units Subcutaneous Q4H   methimazole  5 mg Per Tube TID   mouth rinse  15 mL Mouth Rinse Q2H   pantoprazole sodium  40 mg Per Tube Daily   polyethylene glycol  17 g Per Tube Daily   sodium chloride flush  3 mL Intravenous Q12H    Infusions:  sodium chloride     amiodarone 30 mg/hr (07/05/21 1148)   feeding supplement (VITAL AF 1.2 CAL) 65 mL/hr at 07/05/21 1100   fentaNYL infusion INTRAVENOUS 200 mcg/hr (07/05/21 1100)   norepinephrine (LEVOPHED) Adult infusion 2 mcg/min (07/05/21 1100)   propofol (DIPRIVAN) infusion Stopped (07/05/21 0807)   vasopressin Stopped (07/05/21 0958)     Assessment/Plan   1. Acute on chronic diastolic CHF: TEE today showed normal LV size with EF 60-65%, mild RV dysfunction, severe LAE with moderate-severe central MR (likely atrial  functional MR). Suspect CHF exacerbation was triggered by atrial fibrillation, also mitral regurgitation may have been worsened by atrial fibrillation.  She had a recent prior admission with CHF in the setting of atrial fibrillation.  Presentation was consistent with flash pulmonary edema.  Currently intubated with ongoing volume overload, CVP 12.  Also recent AKI with creatinine now trending down (1.67).  - Lasix 60 mg IV x 1 and follow response.  - Need to get her back in NSR.  Failed DCCV today.  - Ischemia as a contributor to flash pulmonary edema is possible though less likely, troponin negative at admission.  Would consider LHC/RHC in the future when creatinine has returned to baseline. Not urgent.  2. Atrial fibrillation: Persistent.  She was on Tikosyn in the past then had AF ablation in 2/21, Tikosyn stopped after this.  She was not on an antiarrhythmic prior to admission.  Has had increased frequency of AF recently, was admitted in 5/23 with AF and CHF exacerbation. She has also been noted to have  developed hyperthyroidism prior to this admission, this can help trigger AF. Needs to resume NSR due to worsening of HF in AF.  DCCV today failed, but she is still on low dose norepinephrine.  - Would repeat DCCV after she has been off norepinephrine and has had a longer amiodarone load.  - Continue Eliquis.  - Amiodarone is not an ideal medication for her (she already has hyperthyroidism).  Would like to avoid this long-term.  Will use to get her back in NSR this admission, she will then need to see EP, hopefully for a redo AF ablation allowing her to safely stop amiodarone.  3. Shock: Patient developed shock after intubation requiring vasopressin and NE.  This may have been related to sedation.  PCT 17.17 with WBCs initially 19, but WBCs now normal and patient is afebrile.  No localizing signs for infection.  Antibiotics were not started. Do not think she had septic shock.  NE is now down to 1.  - Wean off NE prior to reattempting DCCV.  4. AKI: In setting of shock, creatinine up to 2.93.  Baseline around 1.  Creatinine trending back down to 1.67 today.  5. Elevated LFTs: Suspect shock liver rather than related to amiodarone.  Now trending down.  6.  Hyperthyroidism: Patient had this prior to admission (prior to amiodarone use).  Has multinodular goiter.  - Send TSH, free T3, free T4.  - Continue methimazole.  - As above, amiodarone is not ideal but think necessary for the time being.    Length of Stay: 2  [name], MD  07/05/2021, 11:58 AM  Advanced Heart Failure Team Pager [phone] (M-F; 7a - 5p)  Please contact [facility] for night-coverage after hours (4p -7a ) and weekends on [email or web address]

## 2021-07-05 NOTE — Progress Notes (Signed)
[facility] ADULT ICU REPLACEMENT PROTOCOL   The patient does apply for the [facility] Adult ICU Electrolyte Replacment Protocol based on the criteria listed below:   1.Exclusion criteria: TCTS patients, ECMO patients, and Dialysis patients 2. Is GFR >/= 30 ml/min? Yes.    Patient's GFR today is 32 3. Is SCr </= 2? Yes.   Patient's SCr is 1.67 mg/dL 4. Did SCr increase >/= 0.5 in 24 hours? No. 5.Pt's weight >40kg  Yes.   6. Abnormal electrolyte(s): 32  7. Electrolytes replaced per protocol 8.  Call MD STAT for K+ </= 2.5, Phos </= 1, or Mag </= 1 Physician:  n/a  Anna Marquez 07/05/2021 2:19 AM

## 2021-07-05 NOTE — Progress Notes (Signed)
  Echocardiogram Echocardiogram Transesophageal has been performed.  Fidel Levy 07/05/2021, 1:04 PM

## 2021-07-05 NOTE — Procedures (Signed)
Electrical Cardioversion Procedure Note Anna Marquez [id number] 1950/05/12  Procedure: Electrical Cardioversion Indications:  Atrial Fibrillation  Procedure Details Consent: Risks of procedure as well as the alternatives and risks of each were explained to the (patient/caregiver).  Consent for procedure obtained. Time Out: Verified patient identification, verified procedure, site/side was marked, verified correct patient position, special equipment/implants available, medications/allergies/relevent history reviewed, required imaging and test results available.  Performed  Patient placed on cardiac monitor, pulse oximetry, supplemental oxygen as necessary.  Sedation given:  Per CCM Pacer pads placed anterior and posterior chest.  Cardioverted 2 time(s).  Cardioverted at [place].  Evaluation Findings: Post procedure EKG shows: NSR but converted back to AF after about 1 minute after both attempts.  Complications: None Patient did tolerate procedure well.   Anna Marquez 07/05/2021, 11:56 AM

## 2021-07-05 NOTE — Progress Notes (Signed)
Date and time results received: 07/05/21 0214   Test: K Critical Value: 2.7  Name of Provider Notified: Su Grand, MD  Orders Received? Or Actions Taken?:  awaiting orders

## 2021-07-05 NOTE — Inpatient Diabetes Management (Signed)
Inpatient Diabetes Program Recommendations  AACE/ADA: New Consensus Statement on Inpatient Glycemic Control (2015)  Target Ranges:  Prepandial:   less than 140 mg/dL      Peak postprandial:   less than 180 mg/dL (1-2 hours)      Critically ill patients:  140 - 180 mg/dL   Lab Results  Component Value Date   GLUCAP 255 (H) 07/05/2021   HGBA1C 5.5 07/03/2021    Review of Glycemic Control  Outpatient Diabetes medications: none Current orders for Inpatient glycemic control: Novolog 0-15 units Q4H Vital @ 38m/hr Inpatient Diabetes Program Recommendations:    Consider adding Novolog 3 units Q4H for tube feed coverage (to be stopped or held in the event tube feeds stopped).   Thanks, LBronson Curb MSN, RNC-OB Diabetes Coordinator [phone](8a-5p)

## 2021-07-05 NOTE — Procedures (Signed)
Extubation Procedure Note  Patient Details:   Name: Anna Marquez DOB: [birth year] MRN: [id number]   Airway Documentation:    Vent end date: 07/05/21 Vent end time: 1400   Evaluation  O2 sats: stable throughout Complications: No apparent complications Patient did tolerate procedure well. Bilateral Breath Sounds: Clear, Diminished   Yes  Positive cuff leak noted. Patient placed on  [place] 4L with humidity, no stridor noted. Patient able to reach 1250 mL with IS.  Bayard Beaver 07/05/2021, 2:12 PM

## 2021-07-05 NOTE — CV Procedure (Addendum)
Procedure: TEE  Sedation: Per CCM  Indication: Atrial fibrillation, mitral regurgitation  Findings: Please see echo section for full report.  Normal LV size with mild LV hypertrophy.  EF 60-65%, no regional WMAs.  Normal RV size with mildly decreased systolic function.  Severe left atrial enlargement, no LA appendage thrombus.  Mild right atrial enlargement. No PFO/ASD by color doppler.  Trileaflet aortic valve with trivial AI, no aortic stenosis.  There was moderate to severe central mitral regurgitation with PISA ERO 0.37 cm^2.  Suspect atrial functional MR with severe LAE.  There was slight systolic flow reversal in the systolic PW doppler pattern of 1/4 pulmonary veins.  Trivial TR, peak RV-RA gradient 23 mmHg.   [name] .td 11:56 AM

## 2021-07-05 NOTE — Progress Notes (Addendum)
eLink Physician-Brief Progress Note Patient Name: Anna Marquez DOB: Jun 01, 1950 MRN: [id number]   Date of Service  07/05/2021  HPI/Events of Note  Pt with atrial fibrillation on eliquis.  She was extubated earlier today and failed swallow evaluation. Pt has eliquis due tonight.  BP 129/74, HR 110, RR 20s, O2 sats 95% on nasal cannula.   eICU Interventions  Will give 1 dose of Lovenox at '1mg'$ /kg tonight until swallow reevaluation in the morning.      Intervention Category Intermediate Interventions: Arrhythmia - evaluation and management  Elsie Lincoln 07/05/2021, 9:41 PM

## 2021-07-05 NOTE — Progress Notes (Signed)
NAME:  Anna Marquez, MRN:  [id number], DOB:  [birth year], LOS: 2 ADMISSION DATE:  07/02/2021, CONSULTATION DATE:  6/27 REFERRING MD:  Dr. Darrick Meigs, CHIEF COMPLAINT:  Dyspnea   History of Present Illness:  Anna Marquez is a 71 y.o. woman with history of paroxysmal atrial fibrillation on Eliquis s/p ablation, Hypertension, HFpEF, moderate mitral regurgitation. Admitted 07/02/21 for shortness of breath. She was seen in pulmonary clinic 5 days prior to presentation for what was felt to be acute bronchitis in the setting of recent hospital admission for pneumonia. Treated with levaquin and anti-histamines and flonase for sinusitis.   Has also had refractory atrial fibrillation treated as an outpatient in the context of hyperthyroidism. She is now on methimazole for her hyperthryoidism. Regarding her A. Fib has had ablation in 2021 after failing tikosyn therapy. Has been on rate control with BB and eliquis for her a. Fib otherwise.   She is a lifelong neversmoker with recurrent "bronchitis" and "pneumonia" episodes over the last several weeks. She does have seasonal allergies.  Was admitted to through the ED for acute pulmonary edema and A. Fib.  She was started on amiodarone gtt and seen by cardiology and diuresed with IV lasix. She was placed on bipap for respiratory distress and shortly thereafter underwent a respiratory arrest. Was emergently intubated on the floor and transferred to the ICU for further care.   Pertinent  Medical History  Atrial fibrillation Moderate mitral valve regurgitation secondary to prolapse Grade 1 diastolic dysfunction Never smoker Seasonal allergies  Significant Hospital Events: Including procedures, antibiotic start and stop dates in addition to other pertinent events   6/27 admitted, deteriorated, intubated  Interim History / Subjective:  No events, O2 needs improved.   UOP picking up.  Objective   Blood pressure 132/76, pulse (!) 101, temperature 98.5 F (36.9  C), temperature source Axillary, resp. rate (!) 22, height '5\' 4"'$  (1.626 m), weight 77.9 kg, SpO2 97 %. CVP:  [4 mmHg-16 mmHg] 7 mmHg  Vent Mode: PRVC FiO2 (%):  [40 %-60 %] 40 % Set Rate:  [24 bmp] 24 bmp Vt Set:  [430 mL] 430 mL PEEP:  [8 cmH20-12 cmH20] 8 cmH20 Plateau Pressure:  [18 cmH20-21 cmH20] 18 cmH20   Intake/Output Summary (Last 24 hours) at 07/05/2021 0746 Last data filed at 07/05/2021 9150 Gross per 24 hour  Intake 3926.35 ml  Output 3200 ml  Net 726.35 ml    Filed Weights   07/03/21 1720 07/04/21 0444 07/05/21 0500  Weight: 81.5 kg 79.4 kg 77.9 kg    Examination: No distress Follows commands Minimal secretions Ext without edema Heart sounds irregular, back in afib  Cr up with diuretic challenge More metabolic acidosis LFTs showing pretty bad transaminitis  CXR looks better some retrocardiac opacities but other edema looks better  Resolved Hospital Problem list     Assessment & Plan:   Acute hypoxemic respiratory failure- with edema type pattern on CT and CXR that improved with  Acute on chronic renal failure- improved with stopping diuretics and some volume Recurrent afib- may be driving things Hepatocellular acute liver injury- question amio effect question congestive with RV dysfunction Sedation associated hypotension Retrocardiac opacity on CXR underwhelming for PNA New RV dysfunction- VTE workup neg Pct elevated- but fever/WBC curve improved Known mitral regurg- really would like TEE to fully exclude occult MR before extubation  Unifying diagnosis unclear, does she have tachyarrythmia induced RV dysfunction or is her valvular disease worse than TTE lets on?  Volume  status does not seem to be the problem at present.  - Continue to hold diuretics, kidney US benign - TEE prior to extubation - Lung protective tidal volumes limiting driving pressures to < 15cm H2O as able - Sedation titrated to vent compliance and patient comfort using PAD  orderset - VAP prevention bundle - Afib management per cardiology, will rebolus  - Recheck LFTs - Monitor WBC/fever curve, hold additional abx for now - Wean pressors for MAP 65 - ? Role for RHC if everything else unrevealing  Best Practice (right click and "Reselect all SmartList Selections" daily)   Diet/type: TF DVT prophylaxis: hold eliquis with liver injury, consider heparin gtt vs. Resumption of eliquis in AM GI prophylaxis: H2B Lines: Central line Foley:  N/A Code Status:  full code Last date of multidisciplinary goals of care discussion [updated at bedside]  55 min cc time Erskine Emery MD PCCM

## 2021-07-05 NOTE — Progress Notes (Signed)
Heart Failure Navigator Progress Note  Assessed for Heart & Vascular TOC clinic readiness.  Patient does not meet criteria due to Advanced Heart Failure Team consulted.     Sedona Wenk, BSN, RN Heart Failure Nurse Navigator Secure Chat Only   

## 2021-07-06 DIAGNOSIS — N179 Acute kidney failure, unspecified: Secondary | ICD-10-CM | POA: Diagnosis not present

## 2021-07-06 DIAGNOSIS — I5033 Acute on chronic diastolic (congestive) heart failure: Secondary | ICD-10-CM | POA: Diagnosis not present

## 2021-07-06 LAB — GLUCOSE, CAPILLARY
Glucose-Capillary: 110 mg/dL — ABNORMAL HIGH (ref 70–99)
Glucose-Capillary: 121 mg/dL — ABNORMAL HIGH (ref 70–99)
Glucose-Capillary: 130 mg/dL — ABNORMAL HIGH (ref 70–99)
Glucose-Capillary: 99 mg/dL (ref 70–99)
Glucose-Capillary: 181 mg/dL — ABNORMAL HIGH (ref 70–99)

## 2021-07-06 LAB — COMPREHENSIVE METABOLIC PANEL
ALT: 333 U/L — ABNORMAL HIGH (ref 0–44)
AST: 93 U/L — ABNORMAL HIGH (ref 15–41)
Albumin: 2.4 g/dL — ABNORMAL LOW (ref 3.5–5.0)
Alkaline Phosphatase: 67 U/L (ref 38–126)
Anion gap: 9 (ref 5–15)
BUN: 34 mg/dL — ABNORMAL HIGH (ref 8–23)
CO2: 27 mmol/L (ref 22–32)
Calcium: 8.5 mg/dL — ABNORMAL LOW (ref 8.9–10.3)
Chloride: 108 mmol/L (ref 98–111)
Creatinine, Ser: 1.14 mg/dL — ABNORMAL HIGH (ref 0.44–1.00)
GFR, Estimated: 51 mL/min — ABNORMAL LOW (ref >60)
Glucose, Bld: 108 mg/dL — ABNORMAL HIGH (ref 70–99)
Potassium: 3.6 mmol/L (ref 3.5–5.1)
Sodium: 144 mmol/L (ref 135–145)
Total Bilirubin: 1.5 mg/dL — ABNORMAL HIGH (ref 0.3–1.2)
Total Protein: 6.5 g/dL (ref 6.5–8.1)

## 2021-07-06 LAB — CBC
HCT: 29.4 % — ABNORMAL LOW (ref 36.0–46.0)
Hemoglobin: 9.7 g/dL — ABNORMAL LOW (ref 12.0–15.0)
MCH: 29.6 pg (ref 26.0–34.0)
MCHC: 33 g/dL (ref 30.0–36.0)
MCV: 89.6 fL (ref 80.0–100.0)
Platelets: 253 10*3/uL (ref 150–400)
RBC: 3.28 MIL/uL — ABNORMAL LOW (ref 3.87–5.11)
RDW: 14.9 % (ref 11.5–15.5)
WBC: 9.2 10*3/uL (ref 4.0–10.5)
nRBC: 0.2 % (ref 0.0–0.2)

## 2021-07-06 LAB — MAGNESIUM: Magnesium: 2.1 mg/dL (ref 1.7–2.4)

## 2021-07-06 LAB — T3, FREE: T3, Free: 1.4 pg/mL — ABNORMAL LOW (ref 2.0–4.4)

## 2021-07-06 MED ORDER — ACETAMINOPHEN 160 MG/5ML PO SOLN: 650.0000 mg | ORAL | Status: DC | PRN

## 2021-07-06 MED ORDER — SODIUM CHLORIDE 0.9% FLUSH: 10.0000 mL | INTRAVENOUS | Status: DC | PRN

## 2021-07-06 MED ORDER — METHIMAZOLE 10 MG PO TABS
10.0000 mg | ORAL_TABLET | Freq: Three times a day (TID) | ORAL | Status: DC
  Administered 2021-07-06 – 2021-07-07 (×4): 10 mg via ORAL
  Filled 2021-07-06 (×5): qty 1

## 2021-07-06 MED ORDER — DAPAGLIFLOZIN PROPANEDIOL 10 MG PO TABS
10.0000 mg | ORAL_TABLET | Freq: Every day | ORAL | Status: DC
  Administered 2021-07-06 – 2021-07-11 (×6): 10 mg via ORAL
  Filled 2021-07-06 (×6): qty 1

## 2021-07-06 MED ORDER — METHIMAZOLE 10 MG PO TABS
10.0000 mg | ORAL_TABLET | Freq: Three times a day (TID) | ORAL | Status: DC
  Filled 2021-07-06: qty 1

## 2021-07-06 MED ORDER — HEPARIN SODIUM (PORCINE) 1000 UNIT/ML DIALYSIS: 1000.0000 [IU] | INTRAMUSCULAR | Status: DC | PRN

## 2021-07-06 MED ORDER — APIXABAN 5 MG PO TABS
5.0000 mg | ORAL_TABLET | Freq: Two times a day (BID) | ORAL | Status: DC
  Administered 2021-07-06 – 2021-07-11 (×11): 5 mg via ORAL
  Filled 2021-07-06 (×11): qty 1

## 2021-07-06 MED ORDER — PANTOPRAZOLE 2 MG/ML SUSPENSION: 40.0000 mg | Freq: Every day | ORAL | Status: DC

## 2021-07-06 MED ORDER — SODIUM CHLORIDE 0.9% FLUSH
10.0000 mL | Freq: Two times a day (BID) | INTRAVENOUS | Status: DC
  Administered 2021-07-06: 10 mL

## 2021-07-06 MED ORDER — PANTOPRAZOLE 2 MG/ML SUSPENSION
40.0000 mg | Freq: Every day | ORAL | Status: DC
  Administered 2021-07-06 – 2021-07-07 (×2): 40 mg via ORAL
  Filled 2021-07-06 (×2): qty 20

## 2021-07-06 MED ORDER — POTASSIUM CHLORIDE 10 MEQ/50ML IV SOLN
10.0000 meq | INTRAVENOUS | Status: AC
  Administered 2021-07-06 (×4): 10 meq via INTRAVENOUS
  Filled 2021-07-06 (×4): qty 50

## 2021-07-06 NOTE — Progress Notes (Signed)
Patient ID: Anna Marquez, female   DOB: 1950/05/12, 71 y.o.   MRN: [id number]     Advanced Heart Failure Rounding Note  PCP-Cardiologist: Mertie Moores, MD   Subjective:    Extubated yesterday, denies dyspnea.  Failed DCCV yesterday (was on norepinephrine still).  She remains in atrial fibrillation rate 100s on amiodarone gtt 30 mg/hr.   CVP 6. She had 1 dose of Lasix 60 mg IV yesterday with good UOP and creatinine down to 1.14.    Objective:   Weight Range: 77.1 kg Body mass index is 29.18 kg/m.   Vital Signs:   Temp:  [98.3 F (36.8 C)-99.5 F (37.5 C)] 98.8 F (37.1 C) (06/30 0733) Pulse Rate:  [89-126] 106 (06/30 0700) Resp:  [11-32] 29 (06/30 0700) BP: (102-126)/(65-75) 102/75 (06/30 0621) SpO2:  [92 %-98 %] 94 % (06/30 0700) Arterial Line BP: (106-141)/(60-83) 137/79 (06/30 0700) FiO2 (%):  [40 %] 40 % (06/29 1225) Weight:  [77.1 kg] 77.1 kg (06/30 0403) Last BM Date : 07/06/21  Weight change: Filed Weights   07/04/21 0444 07/05/21 0500 07/06/21 0403  Weight: 79.4 kg 77.9 kg 77.1 kg    Intake/Output:   Intake/Output Summary (Last 24 hours) at 07/06/2021 0759 Last data filed at 07/06/2021 0700 Gross per 24 hour  Intake 1640.9 ml  Output 2035 ml  Net -394.1 ml      Physical Exam    General:  Well appearing. No resp difficulty HEENT: Normal Neck: Supple. JVP not elevated. Carotids 2+ bilat; no bruits. No lymphadenopathy or thyromegaly appreciated. Cor: PMI nondisplaced. Irregular rate & rhythm. 1/6 HSM apex. Lungs: Decreased at bases.  Abdomen: Soft, nontender, nondistended. No hepatosplenomegaly. No bruits or masses. Good bowel sounds. Extremities: No cyanosis, clubbing, rash, edema Neuro: Alert & orientedx3, cranial nerves grossly intact. moves all 4 extremities w/o difficulty. Affect pleasant   Telemetry   Atrial fibrillation 100s (personally reviewed)  Labs    CBC Recent Labs    07/05/21 0100 07/06/21 0350  WBC 9.5 9.2  HGB 9.5* 9.7*   HCT 27.5* 29.4*  MCV 87.3 89.6  PLT 250 833   Basic Metabolic Panel Recent Labs    07/05/21 0100 07/05/21 1354 07/06/21 0350  NA 139 141 144  K 2.7* 3.7 3.6  CL 100 103 108  CO2 '24 25 27  '$ GLUCOSE 220* 240* 108*  BUN 46* 40* 34*  CREATININE 1.67* 1.24* 1.14*  CALCIUM 7.9* 8.6* 8.5*  MG 1.8  --  2.1  PHOS 3.1  --   --    Liver Function Tests Recent Labs    07/05/21 0100 07/06/21 0350  AST 291* 93*  ALT 556* 333*  ALKPHOS 72 67  BILITOT 1.3* 1.5*  PROT 5.9* 6.5  ALBUMIN 2.5* 2.4*   Recent Labs    07/05/21 0100  LIPASE 42   Cardiac Enzymes No results for input(s): "CKTOTAL", "CKMB", "CKMBINDEX", "TROPONINI" in the last 72 hours.  BNP: BNP (last 3 results) Recent Labs    06/05/21 0654 07/02/21 2329  BNP 81.9 699.7*    ProBNP (last 3 results) No results for input(s): "PROBNP" in the last 8760 hours.   D-Dimer No results for input(s): "DDIMER" in the last 72 hours. Hemoglobin A1C No results for input(s): "HGBA1C" in the last 72 hours. Fasting Lipid Panel Recent Labs    07/05/21 0100  TRIG 503*   Thyroid Function Tests Recent Labs    07/05/21 1212  TSH 0.264*    Other results:   Imaging  ECHO TEE  Result Date: 07/05/2021    TRANSESOPHOGEAL ECHO REPORT   Patient Name:   Anna Marquez Date of Exam: 07/05/2021 Medical Rec #:  [id number]      Height:       64.0 in Accession #:    [id number]     Weight:       171.7 lb Date of Birth:  1950-10-17      BSA:          1.834 m Patient Age:    14 years       BP:           112/65 mmHg Patient Gender: F              HR:           100 bpm. Exam Location:  Inpatient Procedure: Transesophageal Echo, Color Doppler, Cardiac Doppler and 3D Echo Indications:     I48.91* Unspeicified atrial fibrillation  History:         Patient has prior history of Echocardiogram examinations, most                  recent 07/04/2021. Mitral Valve Prolapse, Arrythmias:Atrial                  Fibrillation; Risk Factors:Hypertension  and GERD.  Sonographer:     Bernadene Person RDCS Referring Phys:  [id number] Candee Furbish Diagnosing Phys: Franki Monte PROCEDURE: After discussion of the risks and benefits of a TEE, an informed consent was obtained from a family member. The transesophogeal probe was passed without difficulty through the esophogus of the patient. Sedation performed by performing physician. Patients was under conscious sedation during this procedure. Anesthetic administered: 262mg of Fentanyl. The patient's vital signs; including heart rate, blood pressure, and oxygen saturation; remained stable throughout the procedure. The patient developed no complications during the procedure. An unsuccessful direct current cardioversion was performed at 200 joules with 2 attempts. IMPRESSIONS  1. Left ventricular ejection fraction, by estimation, is 55 to 60%. The left ventricle has normal function. There is mild left ventricular hypertrophy.  2. Peak RV-RA gradient 22 mmHg. Right ventricular systolic function is mildly reduced. The right ventricular size is normal.  3. Left atrial size was severely dilated. No left atrial/left atrial appendage thrombus was detected.  4. Right atrial size was moderately dilated.  5. The mitral valve is abnormal. Moderate to severe central mitral valve regurgitation. PISA ERO 0.35 cm^2, vena contracta area from 3-D imaging 0.26 cm^2. Suspect functional MR, likely atrial functional MR with dilated left atrium. No evidence of mitral stenosis.  6. The aortic valve is tricuspid. Aortic valve regurgitation is trivial. No aortic stenosis is present.  7. No PFO or ASD by color doppler. FINDINGS  Left Ventricle: Left ventricular ejection fraction, by estimation, is 55 to 60%. The left ventricle has normal function. The left ventricular internal cavity size was normal in size. There is mild left ventricular hypertrophy. Right Ventricle: Peak RV-RA gradient 22 mmHg. The right ventricular size is normal. No increase in  right ventricular wall thickness. Right ventricular systolic function is mildly reduced. Left Atrium: Left atrial size was severely dilated. No left atrial/left atrial appendage thrombus was detected. Right Atrium: Right atrial size was moderately dilated. Pericardium: There is no evidence of pericardial effusion. Mitral Valve: The mitral valve is abnormal. Moderate to severe mitral valve regurgitation. No evidence of mitral valve stenosis. Tricuspid Valve: The tricuspid valve is normal in structure. Tricuspid valve regurgitation  is mild. Aortic Valve: The aortic valve is tricuspid. Aortic valve regurgitation is trivial. No aortic stenosis is present. Pulmonic Valve: The pulmonic valve was normal in structure. Pulmonic valve regurgitation is trivial. Aorta: The aortic root is normal in size and structure. IAS/Shunts: No PFO or ASD by color doppler.  MR Peak grad:    75.0 mmHg MR Mean grad:    52.0 mmHg MR Vmax:         433.00 cm/s MR Vmean:        339.0 cm/s MR PISA:         3.08 cm MR PISA Eff ROA: 28 mm MR PISA Radius:  0.70 cm Aila Terra McleanMD Electronically signed by Franki Monte Signature Date/Time: 07/05/2021/3:12:11 PM    Final    US Abdomen Limited RUQ (LIVER/GB)  Result Date: 07/05/2021 CLINICAL DATA:  Abnormal liver function tests EXAM: ULTRASOUND ABDOMEN LIMITED RIGHT UPPER QUADRANT COMPARISON:  None Available. FINDINGS: Gallbladder: Gallbladder is not seen consistent with cholecystectomy. Common bile duct: Diameter: 7.5 mm. There is no significant dilation of intrahepatic bile ducts. Liver: There is slightly increased echogenicity in the liver. There is mild nodularity in the liver surface. No focal abnormality is seen in the visualized portions of liver. Portal vein is patent on color Doppler imaging with normal direction of blood flow towards the liver. Other: Trace amount of fluid is noted adjacent to the right kidney. IMPRESSION: Status post cholecystectomy. There is prominence of proximal  common bile duct possibly due to post cholecystectomy state. Increased echogenicity in the liver suggests fatty infiltration. There is mild nodularity in the liver surface suggesting possible cirrhosis. Electronically Signed   By: Elmer Picker M.D.   On: 07/05/2021 10:02     Medications:     Scheduled Medications:  apixaban  5 mg Per Tube BID   Chlorhexidine Gluconate Cloth  6 each Topical Daily   insulin aspart  0-15 Units Subcutaneous Q4H   methimazole  5 mg Per Tube TID   pantoprazole sodium  40 mg Per Tube Daily   sodium chloride flush  10-40 mL Intracatheter Q12H   sodium chloride flush  3 mL Intravenous Q12H    Infusions:  sodium chloride     amiodarone 30 mg/hr (07/06/21 0727)   feeding supplement (VITAL AF 1.2 CAL) Stopped (07/05/21 1400)   norepinephrine (LEVOPHED) Adult infusion Stopped (07/05/21 1354)   potassium chloride     vasopressin Stopped (07/05/21 1341)    PRN Medications: sodium chloride, acetaminophen (TYLENOL) oral liquid 160 mg/5 mL, albuterol, fluticasone, ondansetron (ZOFRAN) IV, mouth rinse, sodium chloride flush, sodium chloride flush   Assessment/Plan   1. Acute on chronic diastolic CHF: TEE on 0/27/25 showed normal LV size with EF 60-65%, mild RV dysfunction, severe LAE with moderate-severe central MR (likely atrial functional MR). Suspect CHF exacerbation was triggered by atrial fibrillation, also mitral regurgitation may have been worsened by atrial fibrillation.  She had a recent prior admission with CHF in the setting of atrial fibrillation.  Presentation was consistent with flash pulmonary edema.  Gentle diuresis yesterday, creatinine down to 1.1 with CVP down to 6.  - Can hold diuretics today.  - Start Farxiga 10 mg daily.  - Need to get her back in NSR.  Failed DCCV on 6/29, will re-attempt on Monday.  - Ischemia as a contributor to flash pulmonary edema is possible though less likely, troponin negative at admission.  Would consider  LHC/RHC in the future, not urgent.  2. Atrial fibrillation: Persistent.  She  was on Tikosyn in the past then had AF ablation in 2/21, Tikosyn stopped after this.  She was not on an antiarrhythmic prior to admission.  Has had increased frequency of AF recently, was admitted in 5/23 with AF and CHF exacerbation. She has also been noted to have developed hyperthyroidism prior to this admission, this can help trigger AF. Needs to resume NSR due to worsening of HF in AF.  DCCV 6/29 failed, but she was still on low dose norepinephrine.  - Would repeat DCCV after she has been off norepinephrine and has had a longer amiodarone load => Monday.  - Continue Eliquis.  - Amiodarone is not an ideal medication for her (she already has hyperthyroidism).  Would like to avoid this long-term.  Will use to get her back in NSR this admission, she will then need to see EP, hopefully for a redo AF ablation allowing her to safely stop amiodarone.  3. Shock: Patient developed shock after intubation requiring vasopressin and NE.  This may have been related to sedation.  PCT 17.17 with WBCs initially 19, but WBCs now normal and patient is afebrile.  No localizing signs for infection.  Antibiotics were not started. Do not think she had septic shock.  She is now off pressors since extubation with stable BP.  4. AKI: In setting of shock, creatinine up to 2.93.  Baseline around 1.  Creatinine trending back down to 1.1 today.  5. Elevated LFTs: Suspect shock liver rather than related to amiodarone.  Now trending down.  6.  Hyperthyroidism: Patient had this prior to admission (prior to amiodarone use).  Has multinodular goiter.  Low TSH with elevated free T4 this admission.   - Increase methimazole to 10 mg tid.  - As above, amiodarone is not ideal but think necessary for the time being.   CRITICAL CARE Performed by: [name]  Total critical care time: 35 minutes  Critical care time was exclusive of separately billable  procedures and treating other patients.  Critical care was necessary to treat or prevent imminent or life-threatening deterioration.  Critical care was time spent personally by me on the following activities: development of treatment plan with patient and/or surrogate as well as nursing, discussions with consultants, evaluation of patient's response to treatment, examination of patient, obtaining history from patient or surrogate, ordering and performing treatments and interventions, ordering and review of laboratory studies, ordering and review of radiographic studies, pulse oximetry and re-evaluation of patient's condition.    Length of Stay: 3  [name], MD  07/06/2021, 7:59 AM  Advanced Heart Failure Team Pager [phone] (M-F; 7a - 5p)  Please contact [facility] for night-coverage after hours (5p -7a ) and weekends on [email or web address]

## 2021-07-06 NOTE — Evaluation (Signed)
Physical Therapy Evaluation Patient Details Name: Anna Marquez MRN: [id number] DOB: [birth year] Today's Date: 07/06/2021  History of Present Illness  Pt adm 6/27 for acute on chronic diastolic heart failure, pulmonary edema, AKI,  and persistant afib.  Intubated 6/27-6/29. PMH - htn, arthritis, afib, chf  Clinical Impression  Pt admitted with above diagnosis and presents to PT with functional limitations due to deficits listed below (See PT problem list). Pt needs skilled PT to maximize independence and safety to allow discharge to home. Expect pt will make excellent progress toward improved mobility.         Recommendations for follow up therapy are one component of a multi-disciplinary discharge planning process, led by the attending physician.  Recommendations may be updated based on patient status, additional functional criteria and insurance authorization.  Follow Up Recommendations Home health PT      Assistance Recommended at Discharge Intermittent Supervision/Assistance  Patient can return home with the following  A little help with walking and/or transfers;A little help with bathing/dressing/bathroom;Assistance with cooking/housework    Equipment Recommendations None recommended by PT  Recommendations for Other Services  OT consult    Functional Status Assessment Patient has had a recent decline in their functional status and demonstrates the ability to make significant improvements in function in a reasonable and predictable amount of time.     Precautions / Restrictions Precautions Precautions: Fall      Mobility  Bed Mobility Overal bed mobility: Needs Assistance Bed Mobility: Sit to Supine       Sit to supine: Min guard   General bed mobility comments: Assist for safety and lines    Transfers Overall transfer level: Needs assistance Equipment used: Rolling walker (2 wheels) Transfers: Sit to/from Stand, Bed to chair/wheelchair/BSC Sit to Stand: Min  assist   Step pivot transfers: Min guard       General transfer comment: Assist to bring hips up and for balance. BSC to bed with rolling walker    Ambulation/Gait               General Gait Details: Pt declined  Stairs            Wheelchair Mobility    Modified Rankin (Stroke Patients Only)       Balance Overall balance assessment: Needs assistance Sitting-balance support: No upper extremity supported, Feet supported Sitting balance-Leahy Scale: Good     Standing balance support: Bilateral upper extremity supported Standing balance-Leahy Scale: Poor Standing balance comment: walker and min guard for static standing                             Pertinent Vitals/Pain Pain Assessment Pain Assessment: No/denies pain    Home Living Family/patient expects to be discharged to:: Private residence Living Arrangements: Alone Available Help at Discharge: Family;Available 24 hours/day Type of Home: House Home Access: Ramped entrance       Home Layout: One level Home Equipment: Conservation officer, nature (2 wheels)      Prior Function Prior Level of Function : Independent/Modified Independent;Driving             Mobility Comments: No assistive device       Hand Dominance   Dominant Hand: Right    Extremity/Trunk Assessment   Upper Extremity Assessment Upper Extremity Assessment: Overall WFL for tasks assessed    Lower Extremity Assessment Lower Extremity Assessment: Generalized weakness       Communication   Communication: No  difficulties  Cognition Arousal/Alertness: Awake/alert Behavior During Therapy: WFL for tasks assessed/performed Overall Cognitive Status: Within Functional Limits for tasks assessed                                          General Comments      Exercises     Assessment/Plan    PT Assessment Patient needs continued PT services  PT Problem List Decreased strength;Decreased activity  tolerance;Decreased balance;Decreased mobility       PT Treatment Interventions DME instruction;Gait training;Functional mobility training;Therapeutic activities;Therapeutic exercise;Balance training;Patient/family education    PT Goals (Current goals can be found in the Care Plan section)  Acute Rehab PT Goals Patient Stated Goal: return home PT Goal Formulation: With patient Time For Goal Achievement: 07/20/21 Potential to Achieve Goals: Good    Frequency Min 3X/week     Co-evaluation               AM-PAC PT "6 Clicks" Mobility  Outcome Measure Help needed turning from your back to your side while in a flat bed without using bedrails?: None Help needed moving from lying on your back to sitting on the side of a flat bed without using bedrails?: A Little Help needed moving to and from a bed to a chair (including a wheelchair)?: A Little Help needed standing up from a chair using your arms (e.g., wheelchair or bedside chair)?: A Little Help needed to walk in hospital room?: Total Help needed climbing 3-5 steps with a railing? : Total 6 Click Score: 15    End of Session Equipment Utilized During Treatment: Oxygen Activity Tolerance: Patient tolerated treatment well Patient left: in bed;with call bell/phone within reach;with bed alarm set Nurse Communication: Mobility status PT Visit Diagnosis: Other abnormalities of gait and mobility (R26.89);Muscle weakness (generalized) (M62.81)    Time: 1248-1300 PT Time Calculation (min) (ACUTE ONLY): 12 min   Charges:   PT Evaluation $PT Eval Moderate Complexity: 1 [place] Office Baker 07/06/2021, 1:09 PM

## 2021-07-06 NOTE — Evaluation (Signed)
Clinical/Bedside Swallow Evaluation Patient Details  Name: POLINA BURMASTER MRN: [id number] Date of Birth: Sep 11, 1950  Today's Date: 07/06/2021 Time: SLP Start Time (ACUTE ONLY): 46 SLP Stop Time (ACUTE ONLY): 0958 SLP Time Calculation (min) (ACUTE ONLY): 18 min  Past Medical History:  Past Medical History:  Diagnosis Date   Adenomatous colon polyp 2003   Cholelithiasis    COVID    Diverticulosis of colon    Gastric polyps    GERD (gastroesophageal reflux disease)    Helicobacter pylori (H. pylori)    Hiatal hernia    Hypertension    Mitral valve prolapse    Nephrolithiasis    Osteoarthritis    Osteopenia 03/2017   T score -1.5 FRAX 8.9% / 0.9%.  Stable from prior DEXA 2016   Persistent atrial fibrillation (HCC)    TGA (transient global amnesia)    Past Surgical History:  Past Surgical History:  Procedure Laterality Date   ANTERIOR AND POSTERIOR REPAIR N/A 06/14/2014   Procedure: ANTERIOR (CYSTOCELE) AND POSTERIOR REPAIR (RECTOCELE);  Surgeon: Anastasio Auerbach, MD;  Location: [facility];  Service: Gynecology;  Laterality: N/A;   ATRIAL FIBRILLATION ABLATION N/A 02/11/2019   Procedure: ATRIAL FIBRILLATION ABLATION;  Surgeon: Thompson Grayer, MD;  Location: [facility];  Service: Cardiovascular;  Laterality: N/A;   CARDIOVERSION N/A 01/15/2019   Procedure: CARDIOVERSION;  Surgeon: Fay Records, MD;  Location: [facility];  Service: Cardiovascular;  Laterality: N/A;   CHOLECYSTECTOMY     kidney stone removal     x2   VAGINAL HYSTERECTOMY N/A 06/14/2014   Procedure: HYSTERECTOMY VAGINAL;  Surgeon: Anastasio Auerbach, MD;  Location: [facility];  Service: Gynecology;  Laterality: N/A;   HPI:  Pt with acute hypoxemic respiratory failure in setting of pulmonary edema from atrial fibrillation with prominent mitral regurgitation on TEE and acute on chronic diastolic heart failure. She has acute renal and liver injury due to heart failure. Intubated from 6/27-6/29.    Assessment / Plan /  Recommendation  Clinical Impression  Pt demonstrates no immediate cough after 3 oz water swallow, but does have an immediate throat clear. After having puree and further sips delayed coughing began. Suggest pt have her medication in puree, and ice chips throughout the day. Will reassess at 3 pm today as pt seems to still be struggling with some acute post extubation dysphagia that is expected to improve. SLP Visit Diagnosis: Dysphagia, oropharyngeal phase (R13.12)    Aspiration Risk  Mild aspiration risk    Diet Recommendation Ice chips PRN after oral care;NPO except meds   Medication Administration: Whole meds with puree Supervision: Patient able to self feed    Other  Recommendations      Recommendations for follow up therapy are one component of a multi-disciplinary discharge planning process, led by the attending physician.  Recommendations may be updated based on patient status, additional functional criteria and insurance authorization.  Follow up Recommendations No SLP follow up      Assistance Recommended at Discharge    Functional Status Assessment    Frequency and Duration min 2x/week  2 weeks       Prognosis        Swallow Study   General HPI: Pt with acute hypoxemic respiratory failure in setting of pulmonary edema from atrial fibrillation with prominent mitral regurgitation on TEE and acute on chronic diastolic heart failure. She has acute renal and liver injury due to heart failure. Intubated from 6/27-6/29. Type of Study: Bedside Swallow Evaluation  Previous Swallow Assessment: none Diet Prior to this Study: NPO Temperature Spikes Noted: No Respiratory Status: Nasal cannula History of Recent Intubation: Yes Length of Intubations (days): 2 days Date extubated: 07/05/21 Behavior/Cognition: Alert;Cooperative;Pleasant mood Oral Cavity Assessment: Within Functional Limits Oral Care Completed by SLP: No Oral Cavity - Dentition: Adequate natural dentition Vision:  Functional for self-feeding Self-Feeding Abilities: Able to feed self Patient Positioning: Upright in bed Baseline Vocal Quality: Hoarse;Breathy Volitional Cough: Strong    Oral/Motor/Sensory Function Overall Oral Motor/Sensory Function: Within functional limits   Ice Chips Ice chips: Within functional limits   Thin Liquid Thin Liquid: Impaired Pharyngeal  Phase Impairments: Throat Clearing - Immediate;Cough - Delayed    Nectar Thick Nectar Thick Liquid: Not tested   Honey Thick Honey Thick Liquid: Not tested   Puree Puree: Within functional limits   Solid     Solid: Not tested      Lynann Beaver 07/06/2021,12:07 PM

## 2021-07-06 NOTE — Progress Notes (Signed)
NAME:  Anna Marquez, MRN:  [id number], DOB:  11-07-1950, LOS: 3 ADMISSION DATE:  07/02/2021, CONSULTATION DATE:  6/27 REFERRING MD:  Dr. Darrick Meigs, CHIEF COMPLAINT:  Dyspnea   History of Present Illness:  Anna Marquez is a 71 y.o. woman with history of paroxysmal atrial fibrillation on Eliquis s/p ablation, Hypertension, HFpEF, moderate mitral regurgitation. Admitted 07/02/21 for shortness of breath. She was seen in pulmonary clinic 5 days prior to presentation for what was felt to be acute bronchitis in the setting of recent hospital admission for pneumonia. Treated with levaquin and anti-histamines and flonase for sinusitis.   Has also had refractory atrial fibrillation treated as an outpatient in the context of hyperthyroidism. She is now on methimazole for her hyperthryoidism. Regarding her A. Fib has had ablation in 2021 after failing tikosyn therapy. Has been on rate control with BB and eliquis for her a. Fib otherwise.   She is a lifelong neversmoker with recurrent "bronchitis" and "pneumonia" episodes over the last several weeks. She does have seasonal allergies.  Was admitted to through the ED for acute pulmonary edema and A. Fib.  She was started on amiodarone gtt and seen by cardiology and diuresed with IV lasix. She was placed on bipap for respiratory distress and shortly thereafter underwent a respiratory arrest. Was emergently intubated on the floor and transferred to the ICU for further care.   Pertinent  Medical History  Atrial fibrillation Moderate mitral valve regurgitation secondary to prolapse Grade 1 diastolic dysfunction Never smoker Seasonal allergies  Significant Hospital Events: Including procedures, antibiotic start and stop dates in addition to other pertinent events   6/27 admitted, deteriorated, intubated 6/29 Extubated  Interim History / Subjective:   No acute events overnight Extubated yesterday Remains in atrial fibrillation [place] better today Hoarse  voice  She has referral to endocrinology. Has not seen one yet.  Objective   Blood pressure 102/75, pulse (!) 106, temperature 98.9 F (37.2 C), temperature source Oral, resp. rate (!) 29, height '5\' 4"'$  (1.626 m), weight 77.1 kg, SpO2 94 %. CVP:  [5 mmHg-11 mmHg] 10 mmHg  Vent Mode: PSV;CPAP FiO2 (%):  [40 %] 40 % Set Rate:  [24 bmp] 24 bmp Vt Set:  [430 mL] 430 mL PEEP:  [8 cmH20] 8 cmH20 Pressure Support:  [5 cmH20] 5 cmH20 Plateau Pressure:  [18 cmH20] 18 cmH20   Intake/Output Summary (Last 24 hours) at 07/06/2021 1093 Last data filed at 07/06/2021 0700 Gross per 24 hour  Intake 1640.9 ml  Output 2035 ml  Net -394.1 ml   Filed Weights   07/04/21 0444 07/05/21 0500 07/06/21 0403  Weight: 79.4 kg 77.9 kg 77.1 kg    Examination: General: no acute distress, resting in bed HENT: [place]/AT, sclera anicteric, moist mucous membranes, voice is hoarse Lungs: clear to auscultation, no wheezing Cardiovascular: irregularly irregular, no murmurs Abdomen: soft, non-distended, diminished bowel sounds Extremities: warm, no edema Neuro: awake/alert, moving all extremities, no focal deficits GU: n/a  Resolved Hospital Problem list     Assessment & Plan:   Acute hypoxemic respiratory failure Pulmonary Edema Acute on Chronic diastolic CHF Recurrent atrial fibrillation Acute on chronic renal failure Hepatocellular acute liver injury Sedation associated hypotension - resolved Retrocardiac opacity on CXR underwhelming for PNA New RV dysfunction- VTE workup neg Pct elevated- but fever/WBC curve improved Known mitral regurg - exacerbated by atrial fibrillation Dysphagia - post intubation Hyperthyroidism  She has acute hypoxemic respiratory failure in setting of pulmonary edema from atrial fibrillation with  prominent mitral regurgitation on TEE and acute on chronic diastolic heart failure. She has acute renal and liver injury due to heart failure.   - Hold diuresis today - Plan for  cardioversion Monday - Continue amiodarone per cardiology - Repeat swallow study today to resume oral meds - Given dose of lovenox overnight  - Trend LFTs - Monitor WBC/fever curve, hold additional abx for now - Continue methimazole - increased by cardiology. TSH remains low and elevated Free T4. She should follow up outpatient with endocrinology, should consider thyroid ablation as this could be driving her atrial fibrillation.   Best Practice (right click and "Reselect all SmartList Selections" daily)   Diet/type: NPO until speech eval DVT prophylaxis: lovenox GI prophylaxis: H2B Lines: Central line, Arterial Line, and No longer needed.  Order written to d/c  Foley:  N/A Code Status:  full code Last date of multidisciplinary goals of care discussion [updated at bedside]  35 min cc time  Freda Jackson, MD [place] Office: [phone]   See Amion for personal pager PCCM on call pager [phone] until 7pm. Please call Elink 7p-7a. [phone]

## 2021-07-06 NOTE — Progress Notes (Signed)
Pharmacy Electrolyte Replacement  Recent Labs:  Recent Labs    07/05/21 0100 07/05/21 1354 07/06/21 0350  K 2.7*   < > 3.6  MG 1.8  --  2.1  PHOS 3.1  --   --   CREATININE 1.67*   < > 1.14*   < > = values in this interval not displayed.    Low Critical Values (K </= 2.5, Phos </= 1, Mg </= 1) Present: None  MD Contacted: n/a  Plan: KCL 63mq IV x4 via central line per protocol Repeat BMP in AM If further diuresis, will add recheck in PM  JSloan Leiter PharmD, BCPS, BCCCP Clinical Pharmacist Please refer to AThe [facility] At Centennialfor MWest Carthagenumbers 07/06/2021, 7:37 AM

## 2021-07-06 NOTE — Progress Notes (Signed)
Speech Language Pathology Treatment: Dysphagia  Patient Details Name: NAJLA AUGHENBAUGH MRN: [id number] DOB: 1950/01/17 Today's Date: 07/06/2021 Time: 1535-1550 SLP Time Calculation (min) (ACUTE ONLY): 15 min  Assessment / Plan / Recommendation Clinical Impression  Pts vocal quality now clear, much improved since this am. Pt able to tolerate several oz of nectar thick liquids without signs of aspiration. Proceeded to thin, which pt also tolerating. Laryngeal integrity has likely improved through the day. Pt ready to initiate a regular diet and thin liquids. If she starts tough cough or struggle, downgrade to nectar thick liquids. SLP will f/u for tolerance.   HPI HPI: Pt with acute hypoxemic respiratory failure in setting of pulmonary edema from atrial fibrillation with prominent mitral regurgitation on TEE and acute on chronic diastolic heart failure. She has acute renal and liver injury due to heart failure. Intubated from 6/27-6/29.      SLP Plan  Continue with current plan of care      Recommendations for follow up therapy are one component of a multi-disciplinary discharge planning process, led by the attending physician.  Recommendations may be updated based on patient status, additional functional criteria and insurance authorization.    Recommendations  Diet recommendations: Regular;Thin liquid Liquids provided via: Cup;Straw Medication Administration: Whole meds with liquid Supervision: Patient able to self feed Compensations: Slow rate;Small sips/bites Postural Changes and/or Swallow Maneuvers: Seated upright 90 degrees                Follow Up Recommendations: No SLP follow up Plan: Continue with current plan of care           Metha Kolasa, Katherene Ponto  07/06/2021, 4:04 PM

## 2021-07-07 DIAGNOSIS — N179 Acute kidney failure, unspecified: Secondary | ICD-10-CM | POA: Diagnosis not present

## 2021-07-07 DIAGNOSIS — I5033 Acute on chronic diastolic (congestive) heart failure: Secondary | ICD-10-CM | POA: Diagnosis not present

## 2021-07-07 DIAGNOSIS — D649 Anemia, unspecified: Secondary | ICD-10-CM

## 2021-07-07 DIAGNOSIS — I4819 Other persistent atrial fibrillation: Secondary | ICD-10-CM | POA: Diagnosis not present

## 2021-07-07 DIAGNOSIS — E0590 Thyrotoxicosis, unspecified without thyrotoxic crisis or storm: Secondary | ICD-10-CM | POA: Diagnosis not present

## 2021-07-07 LAB — GLUCOSE, CAPILLARY
Glucose-Capillary: 109 mg/dL — ABNORMAL HIGH (ref 70–99)
Glucose-Capillary: 113 mg/dL — ABNORMAL HIGH (ref 70–99)
Glucose-Capillary: 118 mg/dL — ABNORMAL HIGH (ref 70–99)
Glucose-Capillary: 123 mg/dL — ABNORMAL HIGH (ref 70–99)
Glucose-Capillary: 136 mg/dL — ABNORMAL HIGH (ref 70–99)
Glucose-Capillary: 176 mg/dL — ABNORMAL HIGH (ref 70–99)

## 2021-07-07 LAB — BASIC METABOLIC PANEL
Anion gap: 9 (ref 5–15)
BUN: 28 mg/dL — ABNORMAL HIGH (ref 8–23)
CO2: 26 mmol/L (ref 22–32)
Calcium: 8.8 mg/dL — ABNORMAL LOW (ref 8.9–10.3)
Chloride: 107 mmol/L (ref 98–111)
Creatinine, Ser: 1.04 mg/dL — ABNORMAL HIGH (ref 0.44–1.00)
GFR, Estimated: 57 mL/min — ABNORMAL LOW (ref >60)
Glucose, Bld: 110 mg/dL — ABNORMAL HIGH (ref 70–99)
Potassium: 3.4 mmol/L — ABNORMAL LOW (ref 3.5–5.1)
Sodium: 142 mmol/L (ref 135–145)

## 2021-07-07 LAB — CBC
HCT: 29.5 % — ABNORMAL LOW (ref 36.0–46.0)
Hemoglobin: 9.6 g/dL — ABNORMAL LOW (ref 12.0–15.0)
MCH: 29.2 pg (ref 26.0–34.0)
MCHC: 32.5 g/dL (ref 30.0–36.0)
MCV: 89.7 fL (ref 80.0–100.0)
Platelets: 248 10*3/uL (ref 150–400)
RBC: 3.29 MIL/uL — ABNORMAL LOW (ref 3.87–5.11)
RDW: 14.7 % (ref 11.5–15.5)
WBC: 7.3 10*3/uL (ref 4.0–10.5)
nRBC: 0 % (ref 0.0–0.2)

## 2021-07-07 MED ORDER — METHIMAZOLE 5 MG PO TABS
5.0000 mg | ORAL_TABLET | Freq: Three times a day (TID) | ORAL | Status: DC
  Administered 2021-07-07 – 2021-07-11 (×11): 5 mg via ORAL
  Filled 2021-07-07 (×16): qty 1

## 2021-07-07 MED ORDER — POTASSIUM CHLORIDE CRYS ER 20 MEQ PO TBCR
60.0000 meq | EXTENDED_RELEASE_TABLET | Freq: Once | ORAL | Status: AC
  Administered 2021-07-07: 60 meq via ORAL
  Filled 2021-07-07: qty 3

## 2021-07-07 MED ORDER — FUROSEMIDE 20 MG PO TABS
20.0000 mg | ORAL_TABLET | Freq: Every day | ORAL | Status: DC
  Administered 2021-07-07 – 2021-07-11 (×5): 20 mg via ORAL
  Filled 2021-07-07 (×5): qty 1

## 2021-07-07 NOTE — Progress Notes (Signed)
Speech Language Pathology Treatment: Dysphagia  Patient Details Name: Anna Marquez MRN: [id number] DOB: 09/17/1950 Today's Date: 07/07/2021 Time: 0383-3383 SLP Time Calculation (min) (ACUTE ONLY): 13 min  Assessment / Plan / Recommendation Clinical Impression  Anna Marquez is doing very well with her diet.  Voice is much improved. She consumed water and crackers independently with no further s/s of a post-extubation dysphagia. No concerns for aspiration. Continue a regular diet with thin liquids; meds whole in thin liquid. No further SLP f/u is needed. Our service will sign off.   HPI HPI: Pt with acute hypoxemic respiratory failure in setting of pulmonary edema from atrial fibrillation with prominent mitral regurgitation on TEE and acute on chronic diastolic heart failure. She has acute renal and liver injury due to heart failure. Intubated from 6/27-6/29.      SLP Plan  All goals met      Recommendations for follow up therapy are one component of a multi-disciplinary discharge planning process, led by the attending physician.  Recommendations may be updated based on patient status, additional functional criteria and insurance authorization.    Recommendations  Diet recommendations: Regular;Thin liquid Liquids provided via: Cup;Straw Medication Administration: Whole meds with liquid Supervision: Patient able to self feed                Oral Care Recommendations: Oral care BID Follow Up Recommendations: No SLP follow up SLP Visit Diagnosis: Dysphagia, oropharyngeal phase (R13.12) Plan: All goals met         Anna Marquez L. Anna Ringer, MA CCC/SLP Clinical Specialist - Acute Care SLP Acute Rehabilitation Services Office number [phone]   Anna Marquez  07/07/2021, 10:48 AM

## 2021-07-07 NOTE — Progress Notes (Signed)
Physical Therapy Treatment Patient Details Name: Anna Marquez MRN: [id number] DOB: June 15, 1950 Today's Date: 07/07/2021   History of Present Illness Pt adm 6/27 for acute on chronic diastolic heart failure, pulmonary edema, AKI,  and persistant afib.  Intubated 6/27-6/29. PMH - htn, arthritis, afib, chf    PT Comments    Pt tolerates therapy well today, ambulating household distances without an assistive device. Pt begins ambulation on 2L O2, and is increased to 3L as Pt desaturates. Pt has several occurrences of tachycardia during the session, and PT manages session accordingly to maintain Pt safety. Continued therapy will benefit the Pt's activity tolerance for ambulation and completion of ADLs.  Recommendations for follow up therapy are one component of a multi-disciplinary discharge planning process, led by the attending physician.  Recommendations may be updated based on patient status, additional functional criteria and insurance authorization.  Follow Up Recommendations  Home health PT     Assistance Recommended at Discharge Intermittent Supervision/Assistance  Patient can return home with the following A little help with walking and/or transfers;A little help with bathing/dressing/bathroom;Assistance with cooking/housework   Equipment Recommendations  None recommended by PT    Recommendations for Other Services       Precautions / Restrictions Precautions Precautions: Fall Restrictions Weight Bearing Restrictions: No     Mobility  Bed Mobility Overal bed mobility: Modified Independent Bed Mobility: Supine to Sit     Supine to sit: Modified independent (Device/Increase time), HOB elevated          Transfers Overall transfer level: Independent Equipment used: None Transfers: Sit to/from Stand Sit to Stand: Independent                Ambulation/Gait Ambulation/Gait assistance: Supervision Gait Distance (Feet): 120 Feet Assistive device: None Gait  Pattern/deviations: Step-through pattern, Decreased step length - right, Decreased step length - left, Decreased stride length Gait velocity: decreased Gait velocity interpretation: 1.31 - 2.62 ft/sec, indicative of limited community ambulator   General Gait Details: Pt begins ambulation on 2L of O2; increased to 3L as necessary with saturation   Stairs             Wheelchair Mobility    Modified Rankin (Stroke Patients Only)       Balance Overall balance assessment: Needs assistance Sitting-balance support: No upper extremity supported, Feet supported Sitting balance-Leahy Scale: Good     Standing balance support: No upper extremity supported Standing balance-Leahy Scale: Fair                              Cognition Arousal/Alertness: Awake/alert Behavior During Therapy: WFL for tasks assessed/performed Overall Cognitive Status: Within Functional Limits for tasks assessed                                          Exercises      General Comments General comments (skin integrity, edema, etc.): Pt has several occurences of tachycardia during session; symptoms and vitals monitored      Pertinent Vitals/Pain Pain Assessment Pain Assessment: No/denies pain    Home Living                          Prior Function            PT Goals (current goals can now be found in  the care plan section) Acute Rehab PT Goals Patient Stated Goal: return home PT Goal Formulation: With patient Time For Goal Achievement: 07/20/21 Potential to Achieve Goals: Good Progress towards PT goals: Progressing toward goals    Frequency    Min 3X/week      PT Plan Current plan remains appropriate    Co-evaluation              AM-PAC PT "6 Clicks" Mobility   Outcome Measure  Help needed turning from your back to your side while in a flat bed without using bedrails?: None Help needed moving from lying on your back to sitting on the  side of a flat bed without using bedrails?: None Help needed moving to and from a bed to a chair (including a wheelchair)?: None Help needed standing up from a chair using your arms (e.g., wheelchair or bedside chair)?: None Help needed to walk in hospital room?: A Little Help needed climbing 3-5 steps with a railing? : Total 6 Click Score: 20    End of Session Equipment Utilized During Treatment: Oxygen Activity Tolerance: Patient tolerated treatment well Patient left: in chair;with call bell/phone within reach;with family/visitor present Nurse Communication: Mobility status PT Visit Diagnosis: Other abnormalities of gait and mobility (R26.89);Muscle weakness (generalized) (M62.81)     Time: 8115-7262 PT Time Calculation (min) (ACUTE ONLY): 25 min  Charges:  $Gait Training: 8-22 mins $Therapeutic Activity: 8-22 mins                     Hall Busing, SPT Acute Rehabilitation Office #: [phone]    Hall Busing 07/07/2021, 4:04 PM

## 2021-07-07 NOTE — Progress Notes (Signed)
Progress Note   Patient: ROSHAWNDA PECORA VHQ:[id number] DOB: [birth year] DOA: 07/02/2021     4 DOS: the patient was seen and examined on 07/07/2021   Brief hospital course: Mrs. Pickart was admitted to the hospital with the working diagnosis of decompensated heart failure in the setting of uncontrolled atrial fibrillation and acute hyperthyroidism.   71 yo female with the past medical history of heart failure and atrial fibrillation sp ablation who presented with dyspnea. Recent hospitalization 05/30 to 06/09/21 due to heart failure with atrial fibrillation. Reported 2 days of dyspnea. She was evaluated at a urgent care and received prednisone for bronchitis. On her initial physical examination her blood pressure was 104/77, HR 108, rr 22 and 02 saturation 94%, lungs with rales bilaterally but no wheezing, heart with S1 and S2 present, irregularly irregular, abdomen not distended and no lower extremity edema.   Na 136, K 3,4 CL 105 bicarbonate 15 glucose 299 bun 27 cr 1,58  BNP 699 High sensitive troponin 6  Wbc 12,9 hgb 10,9 plt 330  Urine analysis with SG 1,024 > 300 protein, 21-50 wbc. 11-20 rbc   Chest radiograph with cardiomegaly, with bilateral hilar vascular congestin, bilateral interstitial infiltrates, more at the bases.   EKG 99 bpm, normal axis, normal qtc, atrial fibrillation rhythm, no significant ST segment or T wave changes.   Patient was placed on furosemide for diuresis and added IV amiodarone for rate control.  She developed acute respiratory failure and was placed on non invasive mechanical ventilation.  Developed respiratory arrest and underwent invasive mechanical ventilation.  Circulatory shock requiring vasopressors.   06/29 TEE cardioversion.  06/29 liberated from mechanical ventilation   07/01 transferred to [facility]   Assessment and Plan: * Acute on chronic diastolic CHF (congestive heart failure) (HCC) Echocardiogram with preserved LV systolic function with EF 50 to  55%, moderate reduction in RV systolic function, RVSP 41,3. Small pericardial effusion. Moderate TR and MR.   Acute on chronic core pulmonale.  Circulatory shock, now off vasopressors.   Plan.  Continue diuresis with furosemide.  IV amiodarone for rate and rhythm control atrial fibrillation.  On Dapagliflozin.   Persistent atrial fibrillation ([place]) 06/30 DC cardioversion  Patient continue in atrial fibrillation rhythm   Plan to continue with amiodarone infusion, for rate and possible rhythm control. Anticoagulation with apixaban. Follow with cardiology recommendations for possible repeat cardioversion or ablation.   AKI (acute kidney injury) ([place]) Hypokalemia,   Renal function with serum cr at 1,0 with K at 3,4 and serum bicarbonate at 26,.  Plan to continue diuresis with furosemide  Add Kcl for K correction.  Follow up renal function in am.   Hyperthyroidism Continue with methimazole.  Follow up THS continue to be low, high free T4 and low free T3.    Essential hypertension Continue close blood pressure monitoring.   Anemia Cell count has been stable, no indication for PRBC transfusion.   Hyperglycemia In setting of recent steroid use this past 2 days, not normally diabetic 1) stop steroids (only started on 6/25), not currently wheezing, etc 2) SSI mod scale AC for now        Subjective: Patient is feeling better, but not back to her baseline.   Physical Exam: Vitals:   07/06/21 2157 07/06/21 2226 07/07/21 0022 07/07/21 0357  BP: 105/77 111/87 96/67 122/90  Pulse: (!) 106 (!) 104 100 (!) 109  Resp: (!) '24 20 20 20  '$ Temp:  98.5 F (36.9 C) 98.3 F (36.8 C)  86 F (36.7 C)  TempSrc:  Oral Oral Oral  SpO2: 96% 94% 92% 93%  Weight:  77.7 kg  77.1 kg  Height:       Neurology awake and alert ENT with mild pallor Cardiovascular with S1 and S2 present, irregularly irregular with no gallops, rubs or murmurs Respiratory with no rales or wheezing Abdomen not  distended No lower extremity edema.  Data Reviewed:    Family Communication: I spoke with patient's family at the bedside, we talked in detail about patient's condition, plan of care and prognosis and all questions were addressed.   Disposition: Status is: Inpatient Remains inpatient appropriate because: atrial fibrillation and heart failure  Planned Discharge Destination: Home     Author: Tawni Millers, MD 07/07/2021 2:01 PM  For on call review [email or web address].

## 2021-07-07 NOTE — Evaluation (Signed)
Occupational Therapy Evaluation Patient Details Name: Anna Marquez MRN: [id number] DOB: 17-Feb-1950 Today's Date: 07/07/2021   History of Present Illness Pt adm 6/27 for acute on chronic diastolic heart failure, pulmonary edema, AKI,  and persistant afib.  Intubated 6/27-6/29. PMH - htn, arthritis, afib, chf   Clinical Impression   71 yo female admitted for management of above detailed medical problems. Pt is indep with ADLs, iADLs, mobility and was driving prior to admission. She has experienced a decline in her ability to complete these activities due to generalized weakness, decreased activity tolerance and impaired balance. She is newly requiring O2 via nasal cannula but is able to walk short distance to bathroom on room air with SpO2 remaining at 92% during mobility. Pt will continue to benefit from acute OT services to address deficits. Anticipate d/c home with below recommendations.     Recommendations for follow up therapy are one component of a multi-disciplinary discharge planning process, led by the attending physician.  Recommendations may be updated based on patient status, additional functional criteria and insurance authorization.   Follow Up Recommendations  Home health OT    Assistance Recommended at Discharge Intermittent Supervision/Assistance  Patient can return home with the following A little help with walking and/or transfers;A little help with bathing/dressing/bathroom;Assistance with cooking/housework;Assist for transportation    Functional Status Assessment  Patient has had a recent decline in their functional status and demonstrates the ability to make significant improvements in function in a reasonable and predictable amount of time.  Equipment Recommendations  None recommended by OT       Precautions / Restrictions Precautions Precautions: Fall      Mobility Bed Mobility Overal bed mobility: Modified Independent Bed Mobility:  (received seated EOB)                 Transfers Overall transfer level: Needs assistance Equipment used: 1 person hand held assist Transfers: Sit to/from Stand, Bed to chair/wheelchair/BSC Sit to Stand: Min assist, Min guard           General transfer comment: ambulating to and from bathroom with min guard assist      Balance Overall balance assessment: Needs assistance Sitting-balance support: No upper extremity supported, Feet supported       Standing balance support: Single extremity supported                 ADL either performed or assessed with clinical judgement   ADL Overall ADL's : Needs assistance/impaired Eating/Feeding: Independent   Grooming: Wash/dry hands;Min guard;Standing   Upper Body Bathing: Min guard;Sitting   Lower Body Bathing: Minimal assistance;Sit to/from stand   Upper Body Dressing : Supervision/safety;Sitting   Lower Body Dressing: Minimal assistance;Sit to/from stand   Toilet Transfer: Min guard   Toileting- Water quality scientist and Hygiene: Minimal assistance;Sit to/from stand       Functional mobility during ADLs: Min guard (frequently reaching for supportive surface in room to aid in mobility)       Vision Baseline Vision/History: 1 Wears glasses Ability to See in Adequate Light: 0 Adequate Patient Visual Report: No change from baseline Vision Assessment?: No apparent visual deficits            Pertinent Vitals/Pain Pain Assessment Pain Assessment: No/denies pain     Hand Dominance Right   Extremity/Trunk Assessment Upper Extremity Assessment Upper Extremity Assessment: Overall WFL for tasks assessed           Communication Communication Communication: No difficulties   Cognition Arousal/Alertness: Awake/alert  Behavior During Therapy: WFL for tasks assessed/performed Overall Cognitive Status: Within Functional Limits for tasks assessed             General Comments  removed O2 for walk to and from bathroom, pt with  SpO2 92% on room air, replaced O2 at end of session            [place] expects to be discharged to:: Private residence Living Arrangements: Alone Available Help at Discharge: Family;Available 24 hours/day Type of Home: House Home Access: Ramped entrance     Home Layout: One level     Bathroom Shower/Tub: Teacher, early years/pre: Standard                Prior Functioning/Environment Prior Level of Function : Independent/Modified Independent;Driving             Mobility Comments: No assistive device ADLs Comments: no difficulty completing ADLs prior        OT Problem List: Decreased strength;Decreased activity tolerance;Impaired balance (sitting and/or standing);Cardiopulmonary status limiting activity      OT Treatment/Interventions: Self-care/ADL training;Energy conservation;DME and/or AE instruction;Therapeutic activities;Patient/family education    OT Goals(Current goals can be found in the care plan section) Acute Rehab OT Goals Patient Stated Goal: Return to baseline level of function OT Goal Formulation: With patient Time For Goal Achievement: 07/21/21 Potential to Achieve Goals: Good ADL Goals Pt Will Perform Grooming: standing;with modified independence Pt Will Perform Lower Body Bathing: with modified independence;with adaptive equipment Pt Will Perform Lower Body Dressing: with modified independence;sit to/from stand Pt Will Transfer to Toilet: with modified independence;ambulating;regular height toilet Pt Will Perform Toileting - Clothing Manipulation and hygiene: with modified independence;sit to/from stand  OT Frequency: Min 2X/week    AM-PAC OT "6 Clicks" Daily Activity     Outcome Measure Help from another person eating meals?: None Help from another person taking care of personal grooming?: A Little Help from another person toileting, which includes using toliet, bedpan, or urinal?: A Little Help from another  person bathing (including washing, rinsing, drying)?: A Little Help from another person to put on and taking off regular upper body clothing?: A Little Help from another person to put on and taking off regular lower body clothing?: A Little 6 Click Score: 19   End of Session Equipment Utilized During Treatment: Oxygen Nurse Communication: Mobility status  Activity Tolerance: Patient tolerated treatment well Patient left: in bed  OT Visit Diagnosis: Unsteadiness on feet (R26.81);Muscle weakness (generalized) (M62.81);Other abnormalities of gait and mobility (R26.89)                Time: 1105-1130 OT Time Calculation (min): 25 min Charges:  OT General Charges $OT Visit: 1 Visit OT Evaluation $OT Eval Moderate Complexity: 1 Mod OT Treatments $Self Care/Home Management : 8-22 mins   Naomie Dean Geoff Dacanay, OTR/L 07/07/2021, 11:50 AM

## 2021-07-07 NOTE — Progress Notes (Signed)
Patient ID: Anna Marquez, female   DOB: 1950-10-01, 71 y.o.   MRN: [id number]     Advanced Heart Failure Rounding Note  PCP-Cardiologist: Mertie Moores, MD   Subjective:    Failed DCCV 6/29 (was on norepinephrine still).  She remains in atrial fibrillation rate 100s on amiodarone gtt 30 mg/hr.   Creatinine stable 1.04.    Feels good, denies dyspnea.     Objective:   Weight Range: 77.1 kg Body mass index is 29.18 kg/m.   Vital Signs:   Temp:  [97.9 F (36.6 C)-98.5 F (36.9 C)] 98 F (36.7 C) (07/01 0357) Pulse Rate:  [91-115] 109 (07/01 0357) Resp:  [11-31] 20 (07/01 0357) BP: (96-122)/(67-94) 122/90 (07/01 0357) SpO2:  [91 %-97 %] 93 % (07/01 0357) Arterial Line BP: (115-136)/(67-80) 115/67 (06/30 1500) Weight:  [77.1 kg-77.7 kg] 77.1 kg (07/01 0357) Last BM Date : 07/06/21  Weight change: Filed Weights   07/06/21 0403 07/06/21 2226 07/07/21 0357  Weight: 77.1 kg 77.7 kg 77.1 kg    Intake/Output:   Intake/Output Summary (Last 24 hours) at 07/07/2021 0838 Last data filed at 07/07/2021 0600 Gross per 24 hour  Intake 701.75 ml  Output --  Net 701.75 ml      Physical Exam    General: NAD Neck: JVP 8 cm, no thyromegaly or thyroid nodule.  Lungs: Clear to auscultation bilaterally with normal respiratory effort. CV: Nondisplaced PMI.  Heart regular S1/S2, no S3/S4, 1/6 HSM apex.  No peripheral edema.   Abdomen: Soft, nontender, no hepatosplenomegaly, no distention.  Skin: Intact without lesions or rashes.  Neurologic: Alert and oriented x 3.  Psych: Normal affect. Extremities: No clubbing or cyanosis.  HEENT: Normal.   Telemetry   Atrial fibrillation 100s (personally reviewed)  Labs    CBC Recent Labs    07/06/21 0350 07/07/21 0314  WBC 9.2 7.3  HGB 9.7* 9.6*  HCT 29.4* 29.5*  MCV 89.6 89.7  PLT 253 268   Basic Metabolic Panel Recent Labs    07/05/21 0100 07/05/21 1354 07/06/21 0350 07/07/21 0314  NA 139   < > 144 142  K 2.7*   < > 3.6  3.4*  CL 100   < > 108 107  CO2 24   < > 27 26  GLUCOSE 220*   < > 108* 110*  BUN 46*   < > 34* 28*  CREATININE 1.67*   < > 1.14* 1.04*  CALCIUM 7.9*   < > 8.5* 8.8*  MG 1.8  --  2.1  --   PHOS 3.1  --   --   --    < > = values in this interval not displayed.   Liver Function Tests Recent Labs    07/05/21 0100 07/06/21 0350  AST 291* 93*  ALT 556* 333*  ALKPHOS 72 67  BILITOT 1.3* 1.5*  PROT 5.9* 6.5  ALBUMIN 2.5* 2.4*   Recent Labs    07/05/21 0100  LIPASE 42   Cardiac Enzymes No results for input(s): "CKTOTAL", "CKMB", "CKMBINDEX", "TROPONINI" in the last 72 hours.  BNP: BNP (last 3 results) Recent Labs    06/05/21 0654 07/02/21 2329  BNP 81.9 699.7*    ProBNP (last 3 results) No results for input(s): "PROBNP" in the last 8760 hours.   D-Dimer No results for input(s): "DDIMER" in the last 72 hours. Hemoglobin A1C No results for input(s): "HGBA1C" in the last 72 hours. Fasting Lipid Panel Recent Labs    07/05/21 0100  TRIG 503*   Thyroid Function Tests Recent Labs    07/05/21 1212  TSH 0.264*  T3FREE 1.4*    Other results:   Imaging    No results found.   Medications:     Scheduled Medications:  apixaban  5 mg Oral BID   Chlorhexidine Gluconate Cloth  6 each Topical Daily   dapagliflozin propanediol  10 mg Oral Daily   insulin aspart  0-15 Units Subcutaneous Q4H   methimazole  5 mg Oral TID   pantoprazole sodium  40 mg Oral Daily   sodium chloride flush  3 mL Intravenous Q12H    Infusions:  sodium chloride     amiodarone 30 mg/hr (07/07/21 0729)   feeding supplement (VITAL AF 1.2 CAL) Stopped (07/05/21 1400)   norepinephrine (LEVOPHED) Adult infusion Stopped (07/05/21 1354)   vasopressin Stopped (07/05/21 1341)    PRN Medications: sodium chloride, acetaminophen (TYLENOL) oral liquid 160 mg/5 mL, albuterol, fluticasone, ondansetron (ZOFRAN) IV, mouth rinse, sodium chloride flush   Assessment/Plan   1. Acute on chronic  diastolic CHF: TEE on 05/24/59 showed normal LV size with EF 60-65%, mild RV dysfunction, severe LAE with moderate-severe central MR (likely atrial functional MR). Suspect CHF exacerbation was triggered by atrial fibrillation, also mitral regurgitation may have been worsened by atrial fibrillation.  She had a recent prior admission with CHF in the setting of atrial fibrillation.  Presentation was consistent with flash pulmonary edema.  Volume status looks ok today.  - Start Lasix 20 mg daily.   - Continue Farxiga 10 mg daily.  - Next step will be low dose spironolactone.  - Need to get her back in NSR.  Failed DCCV on 6/29, will re-attempt on Monday.  - Ischemia as a contributor to flash pulmonary edema is possible though less likely, troponin negative at admission.  Would consider LHC/RHC in the future, not urgent.  2. Atrial fibrillation: Persistent.  She was on Tikosyn in the past then had AF ablation in 2/21, Tikosyn stopped after this.  She was not on an antiarrhythmic prior to admission.  Has had increased frequency of AF recently, was admitted in 5/23 with AF and CHF exacerbation. She has also been noted to have developed hyperthyroidism prior to this admission, this can help trigger AF. Needs to resume NSR due to worsening of HF in AF.  DCCV 6/29 failed, but she was still on low dose norepinephrine.  - Would repeat DCCV after she has been off norepinephrine and has had a longer amiodarone load => Monday.  - Continue Eliquis.  - Amiodarone is not an ideal medication for her (she already has hyperthyroidism).  Would like to avoid this long-term.  Will use to get her back in NSR this admission, she will then need to see EP, hopefully for a redo AF ablation allowing her to safely stop amiodarone.  3. Shock: Patient developed shock after intubation requiring vasopressin and NE.  This may have been related to sedation.  PCT 17.17 with WBCs initially 19, but WBCs now normal and patient is afebrile.  No  localizing signs for infection.  Antibiotics were not started. Do not think she had septic shock.  She is now off pressors since extubation with stable BP.  4. AKI: In setting of shock, creatinine up to 2.93.  Baseline around 1.  Creatinine trending back down to 1.04 today.  5. Elevated LFTs: Suspect shock liver rather than related to amiodarone.  Now trending down.  6.  Hyperthyroidism: Patient had this  prior to admission (prior to amiodarone use).  Has multinodular goiter.  Low TSH with elevated free T4 but free T3 low this admission.   - Continue methimazole 5 mg tid.  - As above, amiodarone is not ideal but think necessary for the time being.    Length of Stay: [place] [facility], MD  07/07/2021, 8:38 AM  Advanced Heart Failure Team Pager [phone] (M-F; 7a - 5p)  Please contact [facility] for night-coverage after hours (5p -7a ) and weekends on [email or web address]

## 2021-07-07 NOTE — Assessment & Plan Note (Signed)
06/30 DC cardioversion  Patient continue in atrial fibrillation rhythm   Plan to continue with amiodarone infusion, for rate and possible rhythm control. Anticoagulation with apixaban. Follow with cardiology recommendations for possible repeat cardioversion or ablation.

## 2021-07-07 NOTE — Hospital Course (Signed)
Anna Marquez was admitted to the hospital with the working diagnosis of decompensated heart failure in the setting of uncontrolled atrial fibrillation and acute hyperthyroidism.   71 yo female with the past medical history of heart failure and atrial fibrillation sp ablation who presented with dyspnea. Recent hospitalization 05/30 to 06/09/21 due to heart failure with atrial fibrillation. Reported 2 days of dyspnea. She was evaluated at a urgent care and received prednisone for bronchitis. On her initial physical examination her blood pressure was 104/77, HR 108, rr 22 and 02 saturation 94%, lungs with rales bilaterally but no wheezing, heart with S1 and S2 present, irregularly irregular, abdomen not distended and no lower extremity edema.   Na 136, K 3,4 CL 105 bicarbonate 15 glucose 299 bun 27 cr 1,58  BNP 699 High sensitive troponin 6  Wbc 12,9 hgb 10,9 plt 330  Urine analysis with SG 1,024 > 300 protein, 21-50 wbc. 11-20 rbc   Chest radiograph with cardiomegaly, with bilateral hilar vascular congestin, bilateral interstitial infiltrates, more at the bases.   EKG 99 bpm, normal axis, normal qtc, atrial fibrillation rhythm, no significant ST segment or T wave changes.   Patient was placed on furosemide for diuresis and added IV amiodarone for rate control.  She developed acute respiratory failure and was placed on non invasive mechanical ventilation.  Developed respiratory arrest and underwent invasive mechanical ventilation.  Circulatory shock requiring vasopressors.   06/29 TEE cardioversion.  06/29 liberated from mechanical ventilation   07/01 transferred to [facility].   07/02 patient continue in atrial fibrillation rhythm 07/03 direct current cardioversion today.

## 2021-07-07 NOTE — Assessment & Plan Note (Signed)
Echocardiogram with preserved LV systolic function with EF 50 to 55%, moderate reduction in RV systolic function, RVSP 61,4. Small pericardial effusion. Moderate TR and MR.   Acute on chronic core pulmonale.  Circulatory shock, now off vasopressors.   Plan.  Continue diuresis with furosemide.  IV amiodarone for rate and rhythm control atrial fibrillation.  On Dapagliflozin.

## 2021-07-08 DIAGNOSIS — I5033 Acute on chronic diastolic (congestive) heart failure: Secondary | ICD-10-CM | POA: Diagnosis not present

## 2021-07-08 DIAGNOSIS — N179 Acute kidney failure, unspecified: Secondary | ICD-10-CM | POA: Diagnosis not present

## 2021-07-08 DIAGNOSIS — E0590 Thyrotoxicosis, unspecified without thyrotoxic crisis or storm: Secondary | ICD-10-CM | POA: Diagnosis not present

## 2021-07-08 DIAGNOSIS — I4819 Other persistent atrial fibrillation: Secondary | ICD-10-CM | POA: Diagnosis not present

## 2021-07-08 LAB — CBC
HCT: 29.8 % — ABNORMAL LOW (ref 36.0–46.0)
Hemoglobin: 9.7 g/dL — ABNORMAL LOW (ref 12.0–15.0)
MCH: 29.3 pg (ref 26.0–34.0)
MCHC: 32.6 g/dL (ref 30.0–36.0)
MCV: 90 fL (ref 80.0–100.0)
Platelets: 251 10*3/uL (ref 150–400)
RBC: 3.31 MIL/uL — ABNORMAL LOW (ref 3.87–5.11)
RDW: 14.3 % (ref 11.5–15.5)
WBC: 6.8 10*3/uL (ref 4.0–10.5)
nRBC: 0 % (ref 0.0–0.2)

## 2021-07-08 LAB — GLUCOSE, CAPILLARY
Glucose-Capillary: 99 mg/dL (ref 70–99)
Glucose-Capillary: 145 mg/dL — ABNORMAL HIGH (ref 70–99)
Glucose-Capillary: 147 mg/dL — ABNORMAL HIGH (ref 70–99)
Glucose-Capillary: 112 mg/dL — ABNORMAL HIGH (ref 70–99)
Glucose-Capillary: 99 mg/dL (ref 70–99)

## 2021-07-08 LAB — BASIC METABOLIC PANEL
Anion gap: 7 (ref 5–15)
BUN: 21 mg/dL (ref 8–23)
CO2: 27 mmol/L (ref 22–32)
Calcium: 8.9 mg/dL (ref 8.9–10.3)
Chloride: 107 mmol/L (ref 98–111)
Creatinine, Ser: 1.06 mg/dL — ABNORMAL HIGH (ref 0.44–1.00)
GFR, Estimated: 56 mL/min — ABNORMAL LOW (ref >60)
Glucose, Bld: 108 mg/dL — ABNORMAL HIGH (ref 70–99)
Potassium: 3.7 mmol/L (ref 3.5–5.1)
Sodium: 141 mmol/L (ref 135–145)

## 2021-07-08 MED ORDER — PANTOPRAZOLE SODIUM 40 MG PO TBEC
40.0000 mg | DELAYED_RELEASE_TABLET | Freq: Every day | ORAL | Status: DC
  Administered 2021-07-08 – 2021-07-11 (×4): 40 mg via ORAL
  Filled 2021-07-08 (×4): qty 1

## 2021-07-08 MED ORDER — POTASSIUM CHLORIDE CRYS ER 20 MEQ PO TBCR
40.0000 meq | EXTENDED_RELEASE_TABLET | Freq: Once | ORAL | Status: AC
  Administered 2021-07-08: 40 meq via ORAL
  Filled 2021-07-08: qty 2

## 2021-07-08 MED ORDER — SPIRONOLACTONE 12.5 MG HALF TABLET
12.5000 mg | ORAL_TABLET | Freq: Every day | ORAL | Status: DC
  Administered 2021-07-08 – 2021-07-11 (×4): 12.5 mg via ORAL
  Filled 2021-07-08 (×4): qty 1

## 2021-07-08 MED ORDER — ACETAMINOPHEN 160 MG/5ML PO SOLN: 650.0000 mg | ORAL | Status: DC | PRN

## 2021-07-08 MED ORDER — ACETAMINOPHEN 325 MG PO TABS
650.0000 mg | ORAL_TABLET | Freq: Four times a day (QID) | ORAL | Status: DC | PRN
  Administered 2021-07-08 – 2021-07-10 (×4): 650 mg via ORAL
  Filled 2021-07-08 (×4): qty 2

## 2021-07-08 NOTE — Progress Notes (Signed)
Mobility Specialist Progress Note    07/08/21 1118  Mobility  Activity Ambulated independently in hallway  Level of Assistance Standby assist, set-up cues, supervision of patient - no hands on  Assistive Device None  Distance Ambulated (ft) 170 ft  Activity Response Tolerated well  $Mobility charge 1 Mobility   Pre-Mobility: 103 HR, 99/76 BP, 97% SpO2 Post-Mobility: 103 HR, 91% SpO2  Pt received in bed and agreeable. Had void in BR. No complaints on walk. Ambulated on RA and desatted to ~85% upon return, encouraged pursed lip breathing. RN agreeable to wean pt to 1LO2. Returned to chair with call bell in reach.    Hildred Alamin Mobility Specialist

## 2021-07-08 NOTE — Progress Notes (Signed)
Progress Note   Patient: ELLER SWEIS ZOX:[id number] DOB: [birth year] DOA: 07/02/2021     5 DOS: the patient was seen and examined on 07/08/2021   Brief hospital course: Mrs. Horsford was admitted to the hospital with the working diagnosis of decompensated heart failure in the setting of uncontrolled atrial fibrillation and acute hyperthyroidism.   71 yo female with the past medical history of heart failure and atrial fibrillation sp ablation who presented with dyspnea. Recent hospitalization 05/30 to 06/09/21 due to heart failure with atrial fibrillation. Reported 2 days of dyspnea. She was evaluated at a urgent care and received prednisone for bronchitis. On her initial physical examination her blood pressure was 104/77, HR 108, rr 22 and 02 saturation 94%, lungs with rales bilaterally but no wheezing, heart with S1 and S2 present, irregularly irregular, abdomen not distended and no lower extremity edema.   Na 136, K 3,4 CL 105 bicarbonate 15 glucose 299 bun 27 cr 1,58  BNP 699 High sensitive troponin 6  Wbc 12,9 hgb 10,9 plt 330  Urine analysis with SG 1,024 > 300 protein, 21-50 wbc. 11-20 rbc   Chest radiograph with cardiomegaly, with bilateral hilar vascular congestin, bilateral interstitial infiltrates, more at the bases.   EKG 99 bpm, normal axis, normal qtc, atrial fibrillation rhythm, no significant ST segment or T wave changes.   Patient was placed on furosemide for diuresis and added IV amiodarone for rate control.  She developed acute respiratory failure and was placed on non invasive mechanical ventilation.  Developed respiratory arrest and underwent invasive mechanical ventilation.  Circulatory shock requiring vasopressors.   06/29 TEE cardioversion.  06/29 liberated from mechanical ventilation   07/01 transferred to [facility].   07/02 patient continue in atrial fibrillation rhythm, plan for repeat cardioversion tomorrow.   Assessment and Plan: * Acute on chronic diastolic CHF  (congestive heart failure) (HCC) Echocardiogram with preserved LV systolic function with EF 50 to 55%, moderate reduction in RV systolic function, RVSP 81,1. Small pericardial effusion. Moderate TR and MR.   Acute on chronic core pulmonale.  Circulatory shock (possible due to sedation during mechanical ventilation), now off vasopressors.   Volume status has improved.  Urine output is 1,550 ml since admission.   Plan.  Continue with IV amiodarone for rate and rhythm control atrial fibrillation.  Scheduled cardioversion in am.  Continue with Dapagliflozin, furosemide and spironolactone.   Persistent atrial fibrillation ([place]) 06/30 DC cardioversion   Persistent atrial fibrillation rhythm. Plan for repeat direct current cardioversion in am. Continue anticoagulation with apixaban.  Continue telemetry  Monitoring.   AKI (acute kidney injury) ([place]) Hypokalemia.  Improved volume status. Renal function stable with a serum cr at 1,0 with K at 3,7 and serum bicarbonate at 27.  Add 40 meq Kcl and check Mg in am. Continue diuresis with SGLT2 inh, spironolactone and furosemide.   Hyperthyroidism Continue with methimazole.  Follow up THS continue to be low, high free T4 and low free T3.    Essential hypertension Continue close blood pressure monitoring.   Anemia Cell count has been stable, no indication for PRBC transfusion.   Hyperglycemia Her glucose has improved.  Fasting glucose is 108 mg/dl this am.  Patient is tolerating po well.         Subjective: Patient with no chest pain or palpitations, dyspnea has been improving, she is out of bed to the chair at the time of my evaluation.   Physical Exam: Vitals:   07/08/21 0000 07/08/21 0400 07/08/21 0600  07/08/21 0905  BP:  104/71  102/73  Pulse: 72 70 95 (!) 116  Resp: 19 (!) 22 (!) 22 18  Temp:  98.4 F (36.9 C)  (!) 97.5 F (36.4 C)  TempSrc:  Oral  Oral  SpO2: 94% 93% 93% 96%  Weight:  78.1 kg    Height:        Neurology awake and alert ENT With no pallor Cardiovascular with S1 and S2 present, irregularly irregular with no gallops, rubs or murmurs No JVD No lower extremity edema Respiratory with scattered bilateral rales with no wheezing or rhonchi Abdomen not distended Data Reviewed:    Family Communication: I spoke with patient's family at the bedside, we talked in detail about patient's condition, plan of care and prognosis and all questions were addressed.   Disposition: Status is: Inpatient Remains inpatient appropriate because: atrial fibrillation and heart failure.   Planned Discharge Destination: Home  Author: Tawni Millers, MD 07/08/2021 2:07 PM  For on call review [email or web address].

## 2021-07-08 NOTE — Progress Notes (Signed)
Patient ID: Anna Marquez, female   DOB: 06/16/1950, 71 y.o.   MRN: [id number]     Advanced Heart Failure Rounding Note  PCP-Cardiologist: Mertie Moores, MD   Subjective:    Failed DCCV 6/29 (was on norepinephrine still).  She remains in atrial fibrillation rate 100s on amiodarone gtt 30 mg/hr.  She was in atrial fibrillation transiently yesterday pm.   Creatinine stable 1.06.    Feels good, denies dyspnea.     Objective:   Weight Range: 78.1 kg Body mass index is 29.56 kg/m.   Vital Signs:   Temp:  [98.3 F (36.8 C)-98.4 F (36.9 C)] 98.4 F (36.9 C) (07/02 0400) Pulse Rate:  [70-117] 95 (07/02 0600) Resp:  [18-22] 22 (07/02 0600) BP: (104-111)/(70-76) 104/71 (07/02 0400) SpO2:  [92 %-94 %] 93 % (07/02 0600) Weight:  [78.1 kg] 78.1 kg (07/02 0400) Last BM Date : 07/07/21  Weight change: Filed Weights   07/06/21 2226 07/07/21 0357 07/08/21 0400  Weight: 77.7 kg 77.1 kg 78.1 kg    Intake/Output:   Intake/Output Summary (Last 24 hours) at 07/08/2021 0823 Last data filed at 07/08/2021 0175 Gross per 24 hour  Intake 1610.17 ml  Output 1550 ml  Net 60.17 ml      Physical Exam    General: NAD Neck: No JVD, no thyromegaly or thyroid nodule.  Lungs: Clear to auscultation bilaterally with normal respiratory effort. CV: Nondisplaced PMI.  Heart regular S1/S2, no S3/S4, 1/6 HSM apex.  No peripheral edema.  Abdomen: Soft, nontender, no hepatosplenomegaly, no distention.  Skin: Intact without lesions or rashes.  Neurologic: Alert and oriented x 3.  Psych: Normal affect. Extremities: No clubbing or cyanosis.  HEENT: Normal.   Telemetry   Atrial fibrillation 100s (personally reviewed)  Labs    CBC Recent Labs    07/07/21 0314 07/08/21 0447  WBC 7.3 6.8  HGB 9.6* 9.7*  HCT 29.5* 29.8*  MCV 89.7 90.0  PLT 248 102   Basic Metabolic Panel Recent Labs    07/06/21 0350 07/07/21 0314 07/08/21 0447  NA 144 142 141  K 3.6 3.4* 3.7  CL 108 107 107  CO2 '27  26 27  '$ GLUCOSE 108* 110* 108*  BUN 34* 28* 21  CREATININE 1.14* 1.04* 1.06*  CALCIUM 8.5* 8.8* 8.9  MG 2.1  --   --    Liver Function Tests Recent Labs    07/06/21 0350  AST 93*  ALT 333*  ALKPHOS 67  BILITOT 1.5*  PROT 6.5  ALBUMIN 2.4*   No results for input(s): "LIPASE", "AMYLASE" in the last 72 hours.  Cardiac Enzymes No results for input(s): "CKTOTAL", "CKMB", "CKMBINDEX", "TROPONINI" in the last 72 hours.  BNP: BNP (last 3 results) Recent Labs    06/05/21 0654 07/02/21 2329  BNP 81.9 699.7*    ProBNP (last 3 results) No results for input(s): "PROBNP" in the last 8760 hours.   D-Dimer No results for input(s): "DDIMER" in the last 72 hours. Hemoglobin A1C No results for input(s): "HGBA1C" in the last 72 hours. Fasting Lipid Panel No results for input(s): "CHOL", "HDL", "LDLCALC", "TRIG", "CHOLHDL", "LDLDIRECT" in the last 72 hours.  Thyroid Function Tests Recent Labs    07/05/21 1212  TSH 0.264*  T3FREE 1.4*    Other results:   Imaging    No results found.   Medications:     Scheduled Medications:  apixaban  5 mg Oral BID   Chlorhexidine Gluconate Cloth  6 each Topical Daily  dapagliflozin propanediol  10 mg Oral Daily   furosemide  20 mg Oral Daily   insulin aspart  0-15 Units Subcutaneous Q4H   methimazole  5 mg Oral TID   pantoprazole  40 mg Oral Daily   sodium chloride flush  3 mL Intravenous Q12H   spironolactone  12.5 mg Oral Daily    Infusions:  sodium chloride     amiodarone 30 mg/hr (07/08/21 0621)    PRN Medications: sodium chloride, acetaminophen, albuterol, fluticasone, ondansetron (ZOFRAN) IV, mouth rinse, sodium chloride flush   Assessment/Plan   1. Acute on chronic diastolic CHF: TEE on 2/99/37 showed normal LV size with EF 60-65%, mild RV dysfunction, severe LAE with moderate-severe central MR (likely atrial functional MR). Suspect CHF exacerbation was triggered by atrial fibrillation, also mitral regurgitation  may have been worsened by atrial fibrillation.  She had a recent prior admission with CHF in the setting of atrial fibrillation.  Presentation was consistent with flash pulmonary edema.  Volume status looks ok today.  - Continue Lasix 20 mg daily.   - Continue Farxiga 10 mg daily.  - Start spironolactone 12.5 daily.  - Need to get her back in NSR.  Failed DCCV on 6/29, will re-attempt on Monday. Discussed risks/benefits, she agrees to procedure.  - Ischemia as a contributor to flash pulmonary edema is possible though less likely, troponin negative at admission.  Would consider LHC/RHC in the future, not urgent.  2. Atrial fibrillation: Persistent.  She was on Tikosyn in the past then had AF ablation in 2/21, Tikosyn stopped after this.  She was not on an antiarrhythmic prior to admission.  Has had increased frequency of AF recently, was admitted in 5/23 with AF and CHF exacerbation. She has also been noted to have developed hyperthyroidism prior to this admission, this can help trigger AF. Needs to resume NSR due to worsening of HF in AF.  DCCV 6/29 failed, but she was still on low dose norepinephrine.  - Would repeat DCCV after she has been off norepinephrine and has had a longer amiodarone load => Monday.  - Continue Eliquis.  - Amiodarone is not an ideal medication for her (she already has hyperthyroidism).  Would like to avoid this long-term.  Will use to get her back in NSR this admission, she will then need to see EP, hopefully for a redo AF ablation allowing her to safely stop amiodarone.  3. Shock: Patient developed shock after intubation requiring vasopressin and NE.  This may have been related to sedation.  PCT 17.17 with WBCs initially 19, but WBCs now normal and patient is afebrile.  No localizing signs for infection.  Antibiotics were not started. Do not think she had septic shock.  She is now off pressors since extubation with stable BP.  4. AKI: In setting of shock, creatinine up to 2.93.   Baseline around 1.  Creatinine trending back down to 1.06 today.  5. Elevated LFTs: Suspect shock liver rather than related to amiodarone.  Now trending down.  6.  Hyperthyroidism: Patient had this prior to admission (prior to amiodarone use).  Has multinodular goiter.  Low TSH with elevated free T4 but free T3 low this admission.   - Continue methimazole 5 mg tid.  - As above, amiodarone is not ideal but think necessary for the time being.    Length of Stay: [place], MD  07/08/2021, 8:23 AM  Advanced Heart Failure Team Pager [phone] (M-F; 7a - 5p)  Please contact  [facility] for night-coverage after hours (5p -7a ) and weekends on [email or web address]

## 2021-07-09 ENCOUNTER — Inpatient Hospital Stay (HOSPITAL_COMMUNITY): Payer: PPO | Admitting: Anesthesiology

## 2021-07-09 ENCOUNTER — Encounter (HOSPITAL_COMMUNITY)
Admission: EM | Disposition: A | Discharge status: Home / Self Care | Payer: Self-pay | Source: Home / Self Care | Attending: Internal Medicine

## 2021-07-09 DIAGNOSIS — I4819 Other persistent atrial fibrillation: Secondary | ICD-10-CM | POA: Diagnosis not present

## 2021-07-09 DIAGNOSIS — N179 Acute kidney failure, unspecified: Secondary | ICD-10-CM | POA: Diagnosis not present

## 2021-07-09 DIAGNOSIS — E0590 Thyrotoxicosis, unspecified without thyrotoxic crisis or storm: Secondary | ICD-10-CM | POA: Diagnosis not present

## 2021-07-09 DIAGNOSIS — I4891 Unspecified atrial fibrillation: Secondary | ICD-10-CM

## 2021-07-09 DIAGNOSIS — I509 Heart failure, unspecified: Secondary | ICD-10-CM

## 2021-07-09 DIAGNOSIS — I5033 Acute on chronic diastolic (congestive) heart failure: Secondary | ICD-10-CM | POA: Diagnosis not present

## 2021-07-09 DIAGNOSIS — R739 Hyperglycemia, unspecified: Secondary | ICD-10-CM

## 2021-07-09 DIAGNOSIS — I110 Hypertensive heart disease with heart failure: Secondary | ICD-10-CM

## 2021-07-09 HISTORY — PX: CARDIOVERSION: SHX1299

## 2021-07-09 LAB — BASIC METABOLIC PANEL
Anion gap: 7 (ref 5–15)
BUN: 17 mg/dL (ref 8–23)
CO2: 24 mmol/L (ref 22–32)
Calcium: 8.5 mg/dL — ABNORMAL LOW (ref 8.9–10.3)
Chloride: 107 mmol/L (ref 98–111)
Creatinine, Ser: 0.96 mg/dL (ref 0.44–1.00)
GFR, Estimated: >60 mL/min (ref >60)
Glucose, Bld: 95 mg/dL (ref 70–99)
Potassium: 3.6 mmol/L (ref 3.5–5.1)
Sodium: 138 mmol/L (ref 135–145)

## 2021-07-09 LAB — GLUCOSE, CAPILLARY
Glucose-Capillary: 109 mg/dL — ABNORMAL HIGH (ref 70–99)
Glucose-Capillary: 113 mg/dL — ABNORMAL HIGH (ref 70–99)
Glucose-Capillary: 112 mg/dL — ABNORMAL HIGH (ref 70–99)
Glucose-Capillary: 175 mg/dL — ABNORMAL HIGH (ref 70–99)

## 2021-07-09 LAB — MAGNESIUM: Magnesium: 1.7 mg/dL (ref 1.7–2.4)

## 2021-07-09 LAB — CBC
HCT: 29.7 % — ABNORMAL LOW (ref 36.0–46.0)
Hemoglobin: 10 g/dL — ABNORMAL LOW (ref 12.0–15.0)
MCH: 30.3 pg (ref 26.0–34.0)
MCHC: 33.7 g/dL (ref 30.0–36.0)
MCV: 90 fL (ref 80.0–100.0)
Platelets: 213 10*3/uL (ref 150–400)
RBC: 3.3 MIL/uL — ABNORMAL LOW (ref 3.87–5.11)
RDW: 13.9 % (ref 11.5–15.5)
WBC: 5.4 10*3/uL (ref 4.0–10.5)
nRBC: 0 % (ref 0.0–0.2)

## 2021-07-09 SURGERY — CARDIOVERSION
Anesthesia: General

## 2021-07-09 MED ORDER — ARTIFICIAL TEARS OPHTHALMIC OINT: TOPICAL_OINTMENT | Freq: Every evening | OPHTHALMIC | Status: DC | PRN

## 2021-07-09 MED ORDER — AMIODARONE IV BOLUS ONLY 150 MG/100ML
INTRAVENOUS | Status: DC | PRN
  Administered 2021-07-09: 150 mg via INTRAVENOUS

## 2021-07-09 MED ORDER — PROPOFOL 10 MG/ML IV BOLUS
INTRAVENOUS | Status: DC | PRN
  Administered 2021-07-09: 80 mg via INTRAVENOUS

## 2021-07-09 MED ORDER — LIDOCAINE 2% (20 MG/ML) 5 ML SYRINGE
INTRAMUSCULAR | Status: DC | PRN
  Administered 2021-07-09: 20 mg via INTRAVENOUS

## 2021-07-09 MED ORDER — SODIUM CHLORIDE 0.9 % IV SOLN: INTRAVENOUS | Status: DC

## 2021-07-09 MED ORDER — POTASSIUM CHLORIDE CRYS ER 20 MEQ PO TBCR
40.0000 meq | EXTENDED_RELEASE_TABLET | Freq: Once | ORAL | Status: AC
Start: 2021-07-09 — End: 2021-07-09
  Administered 2021-07-09: 40 meq via ORAL
  Filled 2021-07-09: qty 2

## 2021-07-09 MED ORDER — PREDNISOLONE ACETATE 1 % OP SUSP
1.0000 [drp] | Freq: Four times a day (QID) | OPHTHALMIC | Status: DC
  Administered 2021-07-09 – 2021-07-11 (×8): 1 [drp] via OPHTHALMIC
  Filled 2021-07-09: qty 5

## 2021-07-09 MED ORDER — SODIUM CHLORIDE 0.9 % IV SOLN: INTRAVENOUS | Status: DC | PRN

## 2021-07-09 MED ORDER — MAGNESIUM SULFATE 4 GM/100ML IV SOLN
4.0000 g | Freq: Once | INTRAVENOUS | Status: AC
  Administered 2021-07-09: 4 g via INTRAVENOUS
  Filled 2021-07-09: qty 100

## 2021-07-09 NOTE — Progress Notes (Signed)
Progress Note   Patient: Anna Marquez TMH:[id number] DOB: 1950-02-13 DOA: 07/02/2021     6 DOS: the patient was seen and examined on 07/09/2021   Brief hospital course: Mrs. Nessler was admitted to the hospital with the working diagnosis of decompensated heart failure in the setting of uncontrolled atrial fibrillation and acute hyperthyroidism.   71 yo female with the past medical history of heart failure and atrial fibrillation sp ablation who presented with dyspnea. Recent hospitalization 05/30 to 06/09/21 due to heart failure with atrial fibrillation. Reported 2 days of dyspnea. She was evaluated at a urgent care and received prednisone for bronchitis. On her initial physical examination her blood pressure was 104/77, HR 108, rr 22 and 02 saturation 94%, lungs with rales bilaterally but no wheezing, heart with S1 and S2 present, irregularly irregular, abdomen not distended and no lower extremity edema.   Na 136, K 3,4 CL 105 bicarbonate 15 glucose 299 bun 27 cr 1,58  BNP 699 High sensitive troponin 6  Wbc 12,9 hgb 10,9 plt 330  Urine analysis with SG 1,024 > 300 protein, 21-50 wbc. 11-20 rbc   Chest radiograph with cardiomegaly, with bilateral hilar vascular congestin, bilateral interstitial infiltrates, more at the bases.   EKG 99 bpm, normal axis, normal qtc, atrial fibrillation rhythm, no significant ST segment or T wave changes.   Patient was placed on furosemide for diuresis and added IV amiodarone for rate control.  She developed acute respiratory failure and was placed on non invasive mechanical ventilation.  Developed respiratory arrest and underwent invasive mechanical ventilation.  Circulatory shock requiring vasopressors.   06/29 TEE cardioversion.  06/29 liberated from mechanical ventilation   07/01 transferred to [facility].   07/02 patient continue in atrial fibrillation rhythm 07/03 direct current cardioversion today.   Assessment and Plan: * Acute on chronic diastolic CHF  (congestive heart failure) (HCC) Echocardiogram with preserved LV systolic function with EF 50 to 55%, moderate reduction in RV systolic function, RVSP 92,1. Small pericardial effusion. Moderate TR and MR.   Acute on chronic core pulmonale.  Circulatory shock (possible due to sedation during mechanical ventilation), now off vasopressors.   Urine output is 1,400 ml since admission.  Systolic blood pressure 194 to 111 mmHg.   Plan.  Continue with IV amiodarone for rate and rhythm control atrial fibrillation.  Scheduled cardioversion today.  Continue with Dapagliflozin, furosemide and spironolactone.   Persistent atrial fibrillation ([place]) 06/30 DC cardioversion   Persistent atrial fibrillation rhythm. Plan for repeat direct current cardioversion today Continue anticoagulation with apixaban.  Continue telemetry monitgoring.   AKI (acute kidney injury) ([place]) Hypokalemia. Hypomagnesemia   Patient tolerating well diuresis with furosemide, spironolactone and empagliflozin.   Renal function with serum cr at 0,96, K is 3,6 and serum bicarbonate at 24. Mg 1,7   Continue K correction with Kcl 40 meq and Mg with 2 g of mag sulfate. Target K is 4 and Mg is 2.  Follow up electrolytes in am.    Hyperthyroidism Continue with methimazole.  Follow up THS continue to be low, high free T4 and low free T3.    Essential hypertension Continue close blood pressure monitoring.   Anemia Cell count has been stable, no indication for PRBC transfusion.   Hyperglycemia hgb a1c is 5,5 patient with no diabetes mellitus. Capillary glucose has been stable and hyperglycemia has resolved.  Will discontinue insulin therapy and continue capillary glucose monitoring as needed.           Subjective: Patient  with no chest pain or dyspnea, no palpitations. Left eye with dryness   Physical Exam: Vitals:   07/08/21 1800 07/08/21 2000 07/09/21 0700 07/09/21 0848  BP:  119/84 (!) 121/93 105/73   Pulse: (!) 111 (!) 112 (!) 110 (!) 106  Resp: (!) '23 18 16 20  '$ Temp:  98.4 F (36.9 C) 98.4 F (36.9 C) 98.3 F (36.8 C)  TempSrc:  Oral Oral Oral  SpO2: 93% 94%  94%  Weight:      Height:       Neurology awake and alert  ENT with no pallor Cardiovascular with S1 and S2 present, irregularly irregular with no gallops or rubs Respiratory with no rales or rhonchi Abdomen not distended No lower extremity edema  Data Reviewed:    Family Communication: I spoke with patient's daughter at the bedside, we talked in detail about patient's condition, plan of care and prognosis and all questions were addressed.   Disposition: Status is: Inpatient Remains inpatient appropriate because: dc cardioversion   Planned Discharge Destination: Home    Author: Tawni Millers, MD 07/09/2021 12:02 PM  For on call review [email or web address].

## 2021-07-09 NOTE — H&P (View-Only) (Signed)
Patient ID: Anna Marquez, female   DOB: 1950-11-20, 71 y.o.   MRN: [id number]     Advanced Heart Failure Rounding Note  PCP-Cardiologist: Mertie Moores, MD   Subjective:    Failed DCCV 6/29 (was on norepinephrine still).  She remains in atrial fibrillation rate 100s on amiodarone gtt 30 mg/hr.    No labs yet.   Feels good, denies dyspnea.     Objective:   Weight Range: 78.1 kg Body mass index is 29.56 kg/m.   Vital Signs:   Temp:  [97.5 F (36.4 C)-98.4 F (36.9 C)] 98.4 F (36.9 C) (07/03 0700) Pulse Rate:  [92-116] 110 (07/03 0700) Resp:  [13-28] 16 (07/03 0700) BP: (102-121)/(73-93) 121/93 (07/03 0700) SpO2:  [92 %-96 %] 94 % (07/02 2000) Last BM Date : 07/08/21  Weight change: Filed Weights   07/06/21 2226 07/07/21 0357 07/08/21 0400  Weight: 77.7 kg 77.1 kg 78.1 kg    Intake/Output:   Intake/Output Summary (Last 24 hours) at 07/09/2021 0809 Last data filed at 07/08/2021 2300 Gross per 24 hour  Intake 1106.43 ml  Output 1400 ml  Net -293.57 ml      Physical Exam    General: NAD Neck: No JVD, no thyromegaly or thyroid nodule.  Lungs: Clear to auscultation bilaterally with normal respiratory effort. CV: Nondisplaced PMI.  Heart irregular S1/S2, no S3/S4, no murmur.  No peripheral edema.   Abdomen: Soft, nontender, no hepatosplenomegaly, no distention.  Skin: Intact without lesions or rashes.  Neurologic: Alert and oriented x 3.  Psych: Normal affect. Extremities: No clubbing or cyanosis.  HEENT: Normal.   Telemetry   Atrial fibrillation 100s (personally reviewed)  Labs    CBC Recent Labs    07/07/21 0314 07/08/21 0447  WBC 7.3 6.8  HGB 9.6* 9.7*  HCT 29.5* 29.8*  MCV 89.7 90.0  PLT 248 735   Basic Metabolic Panel Recent Labs    07/07/21 0314 07/08/21 0447  NA 142 141  K 3.4* 3.7  CL 107 107  CO2 26 27  GLUCOSE 110* 108*  BUN 28* 21  CREATININE 1.04* 1.06*  CALCIUM 8.8* 8.9   Liver Function Tests No results for input(s):  "AST", "ALT", "ALKPHOS", "BILITOT", "PROT", "ALBUMIN" in the last 72 hours.  No results for input(s): "LIPASE", "AMYLASE" in the last 72 hours.  Cardiac Enzymes No results for input(s): "CKTOTAL", "CKMB", "CKMBINDEX", "TROPONINI" in the last 72 hours.  BNP: BNP (last 3 results) Recent Labs    06/05/21 0654 07/02/21 2329  BNP 81.9 699.7*    ProBNP (last 3 results) No results for input(s): "PROBNP" in the last 8760 hours.   D-Dimer No results for input(s): "DDIMER" in the last 72 hours. Hemoglobin A1C No results for input(s): "HGBA1C" in the last 72 hours. Fasting Lipid Panel No results for input(s): "CHOL", "HDL", "LDLCALC", "TRIG", "CHOLHDL", "LDLDIRECT" in the last 72 hours.  Thyroid Function Tests No results for input(s): "TSH", "T4TOTAL", "T3FREE", "THYROIDAB" in the last 72 hours.  Invalid input(s): "FREET3"   Other results:   Imaging    No results found.   Medications:     Scheduled Medications:  apixaban  5 mg Oral BID   Chlorhexidine Gluconate Cloth  6 each Topical Daily   dapagliflozin propanediol  10 mg Oral Daily   furosemide  20 mg Oral Daily   insulin aspart  0-15 Units Subcutaneous Q4H   methimazole  5 mg Oral TID   pantoprazole  40 mg Oral Daily   sodium chloride  flush  3 mL Intravenous Q12H   spironolactone  12.5 mg Oral Daily    Infusions:  sodium chloride     sodium chloride     amiodarone 30 mg/hr (07/09/21 0446)    PRN Medications: sodium chloride, acetaminophen, albuterol, fluticasone, ondansetron (ZOFRAN) IV, mouth rinse, sodium chloride flush   Assessment/Plan   1. Acute on chronic diastolic CHF: TEE on 7/82/95 showed normal LV size with EF 60-65%, mild RV dysfunction, severe LAE with moderate-severe central MR (likely atrial functional MR). Suspect CHF exacerbation was triggered by atrial fibrillation, also mitral regurgitation may have been worsened by atrial fibrillation.  She had a recent prior admission with CHF in the  setting of atrial fibrillation.  Presentation was consistent with flash pulmonary edema.  Volume status looks ok today.  - Continue Lasix 20 mg daily.   - Continue Farxiga 10 mg daily.  - Continue spironolactone 12.5 daily.  - Need to get her back in NSR.  Failed DCCV on 6/29, will re-attempt today. Discussed risks/benefits, she agrees to procedure. She is NPO.  - Ischemia as a contributor to flash pulmonary edema is possible though less likely, troponin negative at admission.  Would consider LHC/RHC in the future, not urgent.  2. Atrial fibrillation: Persistent.  She was on Tikosyn in the past then had AF ablation in 2/21, Tikosyn stopped after this.  She was not on an antiarrhythmic prior to admission.  Has had increased frequency of AF recently, was admitted in 5/23 with AF and CHF exacerbation. She has also been noted to have developed hyperthyroidism prior to this admission, this can help trigger AF. Needs to resume NSR due to worsening of HF in AF.  DCCV 6/29 failed, but she was still on low dose norepinephrine.  - Would repeat DCCV after she has been off norepinephrine and has had a longer amiodarone load => today.  - Continue Eliquis.  - Amiodarone is not an ideal medication for her (she already has hyperthyroidism).  Would like to avoid this long-term.  Will use to get her back in NSR this admission, she will then need to see EP, hopefully for a redo AF ablation allowing her to safely stop amiodarone.  3. Shock: Patient developed shock after intubation requiring vasopressin and NE.  This may have been related to sedation.  PCT 17.17 with WBCs initially 19, but WBCs now normal and patient is afebrile.  No localizing signs for infection.  Antibiotics were not started. Do not think she had septic shock.  She is now off pressors since extubation with stable BP.  4. AKI: In setting of shock, creatinine up to 2.93.  Baseline around 1.  Creatinine has been back to baseline.   5. Elevated LFTs: Suspect  shock liver rather than related to amiodarone.  Now trending down.  6.  Hyperthyroidism: Patient had this prior to admission (prior to amiodarone use).  Has multinodular goiter.  Low TSH with elevated free T4 but free T3 low this admission.   - Continue methimazole 5 mg tid.  - As above, amiodarone is not ideal but think necessary for the time being.    Length of Stay: 6  [name], MD  07/09/2021, 8:09 AM  Advanced Heart Failure Team Pager [phone] (M-F; 7a - 5p)  Please contact [facility] for night-coverage after hours (5p -7a ) and weekends on [email or web address]

## 2021-07-09 NOTE — Anesthesia Postprocedure Evaluation (Signed)
Anesthesia Post Note  Patient: Anna Marquez  Procedure(s) Performed: CARDIOVERSION     Patient location during evaluation: PACU Anesthesia Type: General Level of consciousness: awake and alert Pain management: pain level controlled Vital Signs Assessment: post-procedure vital signs reviewed and stable Respiratory status: spontaneous breathing, nonlabored ventilation, respiratory function stable and patient connected to nasal cannula oxygen Cardiovascular status: blood pressure returned to baseline and stable Postop Assessment: no apparent nausea or vomiting Anesthetic complications: no   No notable events documented.  Last Vitals:  Vitals:   07/09/21 1445 07/09/21 1500  BP: 110/81 (!) 87/64  Pulse: 69 77  Resp: 20 17  Temp: 36.4 C   SpO2: 96% 96%    Last Pain:  Vitals:   07/09/21 1445  TempSrc: Oral  PainSc:                  Tiajuana Amass

## 2021-07-09 NOTE — Progress Notes (Signed)
Patient ID: Anna Marquez, female   DOB: 13-May-1950, 71 y.o.   MRN: [id number]     Advanced Heart Failure Rounding Note  PCP-Cardiologist: Mertie Moores, MD   Subjective:    Failed DCCV 6/29 (was on norepinephrine still).  She remains in atrial fibrillation rate 100s on amiodarone gtt 30 mg/hr.    No labs yet.   Feels good, denies dyspnea.     Objective:   Weight Range: 78.1 kg Body mass index is 29.56 kg/m.   Vital Signs:   Temp:  [97.5 F (36.4 C)-98.4 F (36.9 C)] 98.4 F (36.9 C) (07/03 0700) Pulse Rate:  [92-116] 110 (07/03 0700) Resp:  [13-28] 16 (07/03 0700) BP: (102-121)/(73-93) 121/93 (07/03 0700) SpO2:  [92 %-96 %] 94 % (07/02 2000) Last BM Date : 07/08/21  Weight change: Filed Weights   07/06/21 2226 07/07/21 0357 07/08/21 0400  Weight: 77.7 kg 77.1 kg 78.1 kg    Intake/Output:   Intake/Output Summary (Last 24 hours) at 07/09/2021 0809 Last data filed at 07/08/2021 2300 Gross per 24 hour  Intake 1106.43 ml  Output 1400 ml  Net -293.57 ml      Physical Exam    General: NAD Neck: No JVD, no thyromegaly or thyroid nodule.  Lungs: Clear to auscultation bilaterally with normal respiratory effort. CV: Nondisplaced PMI.  Heart irregular S1/S2, no S3/S4, no murmur.  No peripheral edema.   Abdomen: Soft, nontender, no hepatosplenomegaly, no distention.  Skin: Intact without lesions or rashes.  Neurologic: Alert and oriented x 3.  Psych: Normal affect. Extremities: No clubbing or cyanosis.  HEENT: Normal.   Telemetry   Atrial fibrillation 100s (personally reviewed)  Labs    CBC Recent Labs    07/07/21 0314 07/08/21 0447  WBC 7.3 6.8  HGB 9.6* 9.7*  HCT 29.5* 29.8*  MCV 89.7 90.0  PLT 248 993   Basic Metabolic Panel Recent Labs    07/07/21 0314 07/08/21 0447  NA 142 141  K 3.4* 3.7  CL 107 107  CO2 26 27  GLUCOSE 110* 108*  BUN 28* 21  CREATININE 1.04* 1.06*  CALCIUM 8.8* 8.9   Liver Function Tests No results for input(s):  "AST", "ALT", "ALKPHOS", "BILITOT", "PROT", "ALBUMIN" in the last 72 hours.  No results for input(s): "LIPASE", "AMYLASE" in the last 72 hours.  Cardiac Enzymes No results for input(s): "CKTOTAL", "CKMB", "CKMBINDEX", "TROPONINI" in the last 72 hours.  BNP: BNP (last 3 results) Recent Labs    06/05/21 0654 07/02/21 2329  BNP 81.9 699.7*    ProBNP (last 3 results) No results for input(s): "PROBNP" in the last 8760 hours.   D-Dimer No results for input(s): "DDIMER" in the last 72 hours. Hemoglobin A1C No results for input(s): "HGBA1C" in the last 72 hours. Fasting Lipid Panel No results for input(s): "CHOL", "HDL", "LDLCALC", "TRIG", "CHOLHDL", "LDLDIRECT" in the last 72 hours.  Thyroid Function Tests No results for input(s): "TSH", "T4TOTAL", "T3FREE", "THYROIDAB" in the last 72 hours.  Invalid input(s): "FREET3"   Other results:   Imaging    No results found.   Medications:     Scheduled Medications:  apixaban  5 mg Oral BID   Chlorhexidine Gluconate Cloth  6 each Topical Daily   dapagliflozin propanediol  10 mg Oral Daily   furosemide  20 mg Oral Daily   insulin aspart  0-15 Units Subcutaneous Q4H   methimazole  5 mg Oral TID   pantoprazole  40 mg Oral Daily   sodium chloride  flush  3 mL Intravenous Q12H   spironolactone  12.5 mg Oral Daily    Infusions:  sodium chloride     sodium chloride     amiodarone 30 mg/hr (07/09/21 0446)    PRN Medications: sodium chloride, acetaminophen, albuterol, fluticasone, ondansetron (ZOFRAN) IV, mouth rinse, sodium chloride flush   Assessment/Plan   1. Acute on chronic diastolic CHF: TEE on 5/70/17 showed normal LV size with EF 60-65%, mild RV dysfunction, severe LAE with moderate-severe central MR (likely atrial functional MR). Suspect CHF exacerbation was triggered by atrial fibrillation, also mitral regurgitation may have been worsened by atrial fibrillation.  She had a recent prior admission with CHF in the  setting of atrial fibrillation.  Presentation was consistent with flash pulmonary edema.  Volume status looks ok today.  - Continue Lasix 20 mg daily.   - Continue Farxiga 10 mg daily.  - Continue spironolactone 12.5 daily.  - Need to get her back in NSR.  Failed DCCV on 6/29, will re-attempt today. Discussed risks/benefits, she agrees to procedure. She is NPO.  - Ischemia as a contributor to flash pulmonary edema is possible though less likely, troponin negative at admission.  Would consider LHC/RHC in the future, not urgent.  2. Atrial fibrillation: Persistent.  She was on Tikosyn in the past then had AF ablation in 2/21, Tikosyn stopped after this.  She was not on an antiarrhythmic prior to admission.  Has had increased frequency of AF recently, was admitted in 5/23 with AF and CHF exacerbation. She has also been noted to have developed hyperthyroidism prior to this admission, this can help trigger AF. Needs to resume NSR due to worsening of HF in AF.  DCCV 6/29 failed, but she was still on low dose norepinephrine.  - Would repeat DCCV after she has been off norepinephrine and has had a longer amiodarone load => today.  - Continue Eliquis.  - Amiodarone is not an ideal medication for her (she already has hyperthyroidism).  Would like to avoid this long-term.  Will use to get her back in NSR this admission, she will then need to see EP, hopefully for a redo AF ablation allowing her to safely stop amiodarone.  3. Shock: Patient developed shock after intubation requiring vasopressin and NE.  This may have been related to sedation.  PCT 17.17 with WBCs initially 19, but WBCs now normal and patient is afebrile.  No localizing signs for infection.  Antibiotics were not started. Do not think she had septic shock.  She is now off pressors since extubation with stable BP.  4. AKI: In setting of shock, creatinine up to 2.93.  Baseline around 1.  Creatinine has been back to baseline.   5. Elevated LFTs: Suspect  shock liver rather than related to amiodarone.  Now trending down.  6.  Hyperthyroidism: Patient had this prior to admission (prior to amiodarone use).  Has multinodular goiter.  Low TSH with elevated free T4 but free T3 low this admission.   - Continue methimazole 5 mg tid.  - As above, amiodarone is not ideal but think necessary for the time being.    Length of Stay: 6  [name], MD  07/09/2021, 8:09 AM  Advanced Heart Failure Team Pager [phone] (M-F; 7a - 5p)  Please contact [facility] for night-coverage after hours (5p -7a ) and weekends on [email or web address]

## 2021-07-09 NOTE — Progress Notes (Signed)
OT Cancellation Note  Patient Details Name: Anna Marquez MRN: [id number] DOB: 09/29/1950   Cancelled Treatment:    Reason Eval/Treat Not Completed: Patient at procedure or test/ unavailable (Patient out of room for procedure, ENDO) Lodema Hong, [place]  Office [place] 07/09/2021, 1:09 PM

## 2021-07-09 NOTE — Anesthesia Preprocedure Evaluation (Signed)
Anesthesia Evaluation  Patient identified by MRN, date of birth, ID band Patient awake    Reviewed: Allergy & Precautions, NPO status , Patient's Chart, lab work & pertinent test results  Airway Mallampati: II  TM Distance: >3 FB Neck ROM: Full    Dental   Pulmonary  Extubated 6/29   breath sounds clear to auscultation       Cardiovascular hypertension, Pt. on medications and Pt. on home beta blockers +CHF  + dysrhythmias Atrial Fibrillation + Valvular Problems/Murmurs MR  Rhythm:Irregular Rate:Normal     Neuro/Psych TIA Neuromuscular disease    GI/Hepatic hiatal hernia, GERD  ,Elevated LFT's   Endo/Other  Hyperthyroidism   Renal/GU Renal disease     Musculoskeletal  (+) Arthritis ,   Abdominal   Peds  Hematology  (+) Blood dyscrasia, anemia ,   Anesthesia Other Findings   Reproductive/Obstetrics                             Anesthesia Physical Anesthesia Plan  ASA: 3  Anesthesia Plan: General   Post-op Pain Management: Minimal or no pain anticipated   Induction: Intravenous  PONV Risk Score and Plan: 3 and Treatment may vary due to age or medical condition  Airway Management Planned: Mask and Natural Airway  Additional Equipment:   Intra-op Plan:   Post-operative Plan:   Informed Consent: I have reviewed the patients History and Physical, chart, labs and discussed the procedure including the risks, benefits and alternatives for the proposed anesthesia with the patient or authorized representative who has indicated his/her understanding and acceptance.       Plan Discussed with:   Anesthesia Plan Comments:         Anesthesia Quick Evaluation

## 2021-07-09 NOTE — TOC Initial Note (Signed)
Transition of Care [facility]) - Initial/Assessment Note    Patient Details  Name: Anna Marquez MRN: [id number] Date of Birth: 1950-04-09  Transition of Care [facility]) CM/SW Contact:    Anna Rasher, RN Phone Number: [phone] 07/09/2021, 2:36 PM  Clinical Narrative:                  HF TOC CM spoke to pt and dtr at bedside. Offered choice for [facility] PT. Pt declines HH PT at this time. Pt states she lives in her home and boyfriend and dtr will assist as needed. Will continue to follow for dc needs.     Expected Discharge Plan: [place] Barriers to Discharge: Continued Medical Work up   Patient Goals and CMS Choice Patient states their goals for this hospitalization and ongoing recovery are:: wants to get back to baseline CMS Medicare.gov Compare Post Acute Care list provided to:: Patient Choice offered to / list presented to : Patient  Expected Discharge Plan and Services Expected Discharge Plan: [place]   Discharge Planning Services: CM Consult Post Acute Care Choice: [place] arrangements for the past 2 months: Single Family Home                                      Prior Living Arrangements/Services Living arrangements for the past 2 months: Single Family Home Lives with:: Significant Other Patient language and need for interpreter reviewed:: Yes Do you feel safe going back to the place where you live?: Yes      Need for Family Participation in Patient Care: Yes (Comment) Care giver support system in place?: Yes (comment)   Criminal Activity/Legal Involvement Pertinent to Current Situation/Hospitalization: No - Comment as needed  Activities of Daily Living Home Assistive Devices/Equipment: None ADL Screening (condition at time of admission) Patient's cognitive ability adequate to safely complete daily activities?: Yes Is the patient deaf or have difficulty hearing?: No Does the patient have difficulty seeing,  even when wearing glasses/contacts?: No Does the patient have difficulty concentrating, remembering, or making decisions?: No Patient able to express need for assistance with ADLs?: Yes Does the patient have difficulty dressing or bathing?: No Independently performs ADLs?: Yes (appropriate for developmental age) Walks in Home: Independent Does the patient have difficulty walking or climbing stairs?: No Weakness of Legs: None Weakness of Arms/Hands: None  Permission Sought/Granted Permission sought to share information with : Case Manager, Family Supports, PCP Permission granted to share information with : Yes, Verbal Permission Granted  Share Information with NAME: Anna Marquez  Daughter  [phone]           Emotional Assessment Appearance:: Appears stated age Attitude/Demeanor/Rapport: Engaged Affect (typically observed): Accepting Orientation: : Oriented to Self, Oriented to Situation, Oriented to  Time, Oriented to Place Alcohol / Substance Use: Not Applicable Psych Involvement: No (comment)  Admission diagnosis:  Acute on chronic diastolic heart failure (HCC) [I50.33] Hypokalemia [U93.2] Metabolic acidosis [T55.73] SOB (shortness of breath) [R06.02] Serum total bilirubin elevated [R17] Acute kidney injury (nontraumatic) (HCC) [N17.9] Normochromic normocytic anemia [D64.9] Elevated random blood glucose level [R73.09] Acute on chronic diastolic CHF (congestive heart failure) (HCC) [I50.33] Acute on chronic systolic CHF (congestive heart failure) (HCC) [I50.23] Patient Active Problem List   Diagnosis Date Noted   Hyperglycemia 07/03/2021   AKI (acute kidney injury) ([place]) 07/03/2021   Conjunctivitis 06/14/2021  Thyroid nodule 06/08/2021   Hyperthyroidism 06/07/2021   Acute on chronic diastolic CHF (congestive heart failure) ([place]) 06/07/2021   CAP (community acquired pneumonia) 06/07/2021   Acute respiratory failure with hypoxia ([place]) 06/05/2021   Bilateral impacted  cerumen 05/09/2021   Sinobronchitis 02/01/2019   Seasonal allergic rhinitis due to pollen 09/25/2018   Reactive airway disease 09/25/2018   Hypomagnesemia 03/25/2018   Atrial fibrillation with RVR ([place]) 03/16/2018   Anemia 02/04/2018   Recurrent nephrolithiasis 02/04/2018   Screening for lipid disorders 10/28/2017   Cough 11/21/2016   TIA (transient ischemic attack) 03/07/2016   Essential hypertension 01/27/2012   Persistent atrial fibrillation ([place]) 01/09/2012   Migraine without aura 05/20/2008   Allergic rhinitis 05/20/2008   Mitral regurgitation 08/10/2007   GERD 08/10/2007   Diaphragmatic hernia 08/10/2007   DIVERTICULOSIS, COLON 08/10/2007   Osteoarthritis 08/10/2007   COLONIC POLYPS, ADENOMATOUS, HX OF 08/10/2007   PCP:  Anna Nutting, DO Pharmacy:   [facility] [address] [place] [address] Phone: [phone] Fax: [phone]  [id number] - [address], [address] AT [address] [facility] [address] [place] [address] Phone: [phone] Fax: [phone]  [facility] [address] [place] [address] Phone: [phone] Fax: [phone]     Social Determinants of Health (SDOH) Interventions    Readmission Risk Interventions     No data to display

## 2021-07-09 NOTE — Procedures (Signed)
Electrical Cardioversion Procedure Note [name] [id number] 03/03/1950  Procedure: Electrical Cardioversion Indications:  Atrial Fibrillation  Procedure Details Consent: Risks of procedure as well as the alternatives and risks of each were explained to the (patient/caregiver).  Consent for procedure obtained. Time Out: Verified patient identification, verified procedure, site/side was marked, verified correct patient position, special equipment/implants available, medications/allergies/relevent history reviewed, required imaging and test results available.  Performed  Patient placed on cardiac monitor, pulse oximetry, supplemental oxygen as necessary.  Sedation given:  Propofol per anesthesiology Pacer pads placed anterior and posterior chest.  Cardioverted 3 times, sternal pressure used.  Cardioverted at [place].  Evaluation Findings: Post procedure EKG shows: NSR with frequent PACs Complications: None Patient did tolerate procedure well.   Anna Marquez 07/09/2021, 1:31 PM

## 2021-07-09 NOTE — Progress Notes (Signed)
Nutrition Follow-up  DOCUMENTATION CODES:   Not applicable  INTERVENTION:   Encourage PO intake  NUTRITION DIAGNOSIS:   Inadequate oral intake related to inability to eat as evidenced by NPO status. Progressing.   GOAL:   Patient will meet greater than or equal to 90% of their needs Progressing with diet advancement   MONITOR:   TF tolerance, Vent status, Labs  REASON FOR ASSESSMENT:   Ventilator, Consult Enteral/tube feeding initiation and management  ASSESSMENT:   71 yo female admitted with SOB. PMH includes CHF, A fib s/p ablation, newly diagnosed hyperthyroidism, diverticulosis, GERD, H. Pylori, HTN, MVP, osteoarthritis.  Pt extubated, tx out of the ICU, diet advanced to Regular, PO 100%  Pt remains in Afib, currently NPO for cardioversion. Pt out of room during visit.   Labs reviewed.  Medications reviewed and include lasix, protonix, spironolactone Amiodarone drip   Diet Order:   Diet Order             Diet NPO time specified  Diet effective now                   EDUCATION NEEDS:   No education needs have been identified at this time  Skin:  Skin Assessment: Skin Integrity Issues: Skin Integrity Issues:: DTI DTI: L buttocks  Last BM:  no BM documented  Height:   Ht Readings from Last 1 Encounters:  07/03/21 '5\' 4"'$  (1.626 m)    Weight:   Wt Readings from Last 1 Encounters:  07/08/21 78.1 kg    Ideal Body Weight:  54.5 kg  BMI:  Body mass index is 29.56 kg/m.  Estimated Nutritional Needs:   Kcal:  1800-2000  Protein:  100-120 gm  Fluid:  1.8-2 L  Anna Briski P., RD, LDN, CNSC See AMiON for contact information

## 2021-07-09 NOTE — Progress Notes (Signed)
Mobility Specialist Progress Note:   07/09/21 1034  Mobility  Activity Ambulated with assistance in room  Level of Assistance Independent after set-up  Assistive Device None  Distance Ambulated (ft) 30 ft  Activity Response Tolerated well  $Mobility charge 1 Mobility   Pt received in bed willing to participate in mobility. No complaints of pain. Left in bed with call bell in reach and all needs met.   [facility] Anna Marquez Mobility Specialist

## 2021-07-09 NOTE — Anesthesia Procedure Notes (Signed)
Procedure Name: General with mask airway Date/Time: 07/09/2021 1:23 PM  Performed by: Janene Harvey, CRNAPre-anesthesia Checklist: Patient identified, Emergency Drugs available, Suction available and Patient being monitored Patient Re-evaluated:Patient Re-evaluated prior to induction Oxygen Delivery Method: Ambu bag Induction Type: IV induction Dental Injury: Teeth and Oropharynx as per pre-operative assessment

## 2021-07-09 NOTE — Care Management Important Message (Signed)
Important Message  Patient Details  Name: Anna Marquez MRN: [id number] Date of Birth: 11/27/1950   Medicare Important Message Given:  Yes     Shelda Altes 07/09/2021, 9:28 AM

## 2021-07-09 NOTE — Interval H&P Note (Signed)
History and Physical Interval Note:  07/09/2021 1:22 PM  Anna Marquez  has presented today for surgery, with the diagnosis of afib.  The various methods of treatment have been discussed with the patient and family. After consideration of risks, benefits and other options for treatment, the patient has consented to  Procedure(s): CARDIOVERSION (N/A) as a surgical intervention.  The patient's history has been reviewed, patient examined, no change in status, stable for surgery.  I have reviewed the patient's chart and labs.  Questions were answered to the patient's satisfaction.     [place] Torok [employer]

## 2021-07-09 NOTE — Transfer of Care (Signed)
Immediate Anesthesia Transfer of Care Note  Patient: Anna Marquez  Procedure(s) Performed: CARDIOVERSION  Patient Location: Endoscopy Unit  Anesthesia Type:General  Level of Consciousness: drowsy and patient cooperative  Airway & Oxygen Therapy: Patient Spontanous Breathing and Patient connected to nasal cannula oxygen  Post-op Assessment: Report given to RN and Post -op Vital signs reviewed and stable  Post vital signs: Reviewed and stable  Last Vitals:  Vitals Value Taken Time  BP 108/78 07/09/21 1335  Temp    Pulse 71 07/09/21 1336  Resp 25 07/09/21 1336  SpO2 95 % 07/09/21 1336  Vitals shown include unvalidated device data.  Last Pain:  Vitals:   07/09/21 1309  TempSrc: Oral  PainSc: 0-No pain      Patients Stated Pain Goal: 0 (84/73/08 5694)  Complications: No notable events documented.

## 2021-07-10 ENCOUNTER — Encounter (HOSPITAL_COMMUNITY): Payer: Self-pay | Admitting: Cardiology

## 2021-07-10 DIAGNOSIS — N179 Acute kidney failure, unspecified: Secondary | ICD-10-CM | POA: Diagnosis not present

## 2021-07-10 DIAGNOSIS — I4819 Other persistent atrial fibrillation: Secondary | ICD-10-CM | POA: Diagnosis not present

## 2021-07-10 DIAGNOSIS — E0590 Thyrotoxicosis, unspecified without thyrotoxic crisis or storm: Secondary | ICD-10-CM | POA: Diagnosis not present

## 2021-07-10 DIAGNOSIS — I5033 Acute on chronic diastolic (congestive) heart failure: Secondary | ICD-10-CM | POA: Diagnosis not present

## 2021-07-10 LAB — BASIC METABOLIC PANEL
Anion gap: 12 (ref 5–15)
BUN: 19 mg/dL (ref 8–23)
CO2: 23 mmol/L (ref 22–32)
Calcium: 8.6 mg/dL — ABNORMAL LOW (ref 8.9–10.3)
Chloride: 103 mmol/L (ref 98–111)
Creatinine, Ser: 1.11 mg/dL — ABNORMAL HIGH (ref 0.44–1.00)
GFR, Estimated: 53 mL/min — ABNORMAL LOW (ref >60)
Glucose, Bld: 112 mg/dL — ABNORMAL HIGH (ref 70–99)
Potassium: 3.7 mmol/L (ref 3.5–5.1)
Sodium: 138 mmol/L (ref 135–145)

## 2021-07-10 LAB — MAGNESIUM: Magnesium: 2.3 mg/dL (ref 1.7–2.4)

## 2021-07-10 MED ORDER — AMIODARONE HCL 200 MG PO TABS
400.0000 mg | ORAL_TABLET | Freq: Two times a day (BID) | ORAL | Status: DC
  Administered 2021-07-10 – 2021-07-11 (×3): 400 mg via ORAL
  Filled 2021-07-10 (×3): qty 2

## 2021-07-10 MED ORDER — POTASSIUM CHLORIDE CRYS ER 20 MEQ PO TBCR
20.0000 meq | EXTENDED_RELEASE_TABLET | Freq: Once | ORAL | Status: AC
  Administered 2021-07-10: 20 meq via ORAL
  Filled 2021-07-10: qty 1

## 2021-07-10 MED ORDER — POTASSIUM CHLORIDE CRYS ER 20 MEQ PO TBCR
20.0000 meq | EXTENDED_RELEASE_TABLET | Freq: Every day | ORAL | Status: DC
  Administered 2021-07-10 – 2021-07-11 (×2): 20 meq via ORAL
  Filled 2021-07-10 (×2): qty 1

## 2021-07-10 NOTE — Progress Notes (Signed)
Progress Note   Patient: Anna Marquez LFY:[id number] DOB: [birth year] DOA: 07/02/2021     7 DOS: the patient was seen and examined on 07/10/2021   Brief hospital course: Anna Marquez was admitted to the hospital with the working diagnosis of decompensated heart failure in the setting of uncontrolled atrial fibrillation and acute hyperthyroidism.   71 yo female with the past medical history of heart failure and atrial fibrillation sp ablation who presented with dyspnea. Recent hospitalization 05/30 to 06/09/21 due to heart failure with atrial fibrillation. Reported 2 days of dyspnea. She was evaluated at a urgent care and received prednisone for bronchitis. On her initial physical examination her blood pressure was 104/77, HR 108, rr 22 and 02 saturation 94%, lungs with rales bilaterally but no wheezing, heart with S1 and S2 present, irregularly irregular, abdomen not distended and no lower extremity edema.   Na 136, K 3,4 CL 105 bicarbonate 15 glucose 299 bun 27 cr 1,58  BNP 699 High sensitive troponin 6  Wbc 12,9 hgb 10,9 plt 330  Urine analysis with SG 1,024 > 300 protein, 21-50 wbc. 11-20 rbc   Chest radiograph with cardiomegaly, with bilateral hilar vascular congestin, bilateral interstitial infiltrates, more at the bases.   EKG 99 bpm, normal axis, normal qtc, atrial fibrillation rhythm, no significant ST segment or T wave changes.   Patient was placed on furosemide for diuresis and added IV amiodarone for rate control.  She developed acute respiratory failure and was placed on non invasive mechanical ventilation.  Developed respiratory arrest and underwent invasive mechanical ventilation.  Circulatory shock requiring vasopressors.   06/29 TEE cardioversion.  06/29 liberated from mechanical ventilation   07/01 transferred to [facility].   07/02 patient continue in atrial fibrillation rhythm 07/03 direct current cardioversion converting to sinus rhythm. 07/04 transition to oral amiodarone  and plan to discharge home in am if continue stable.   Assessment and Plan: * Acute on chronic diastolic CHF (congestive heart failure) (HCC) Echocardiogram with preserved LV systolic function with EF 50 to 55%, moderate reduction in RV systolic function, RVSP 85,2. Small pericardial effusion. Moderate TR and MR.   Acute on chronic core pulmonale.  Circulatory shock (possible due to sedation during mechanical ventilation), now off vasopressors.   Urine output is 1,605 ml since admission.  Systolic blood pressure 98 to 1112 mmHg.   Plan.  Heart failure management with dapagliflozin, furosemide and spironolactone.   Persistent atrial fibrillation ([place]) 06/30 DC cardioversion  07/03 DC cardioversion.   Now patient is on sinus rhythm, personally reviewed telemetry.  Rate in the 70's Plan to transition to oral amiodarone per cardiology recommendations. Continue anticoagulation with apixaban. Continue telemetry monitoring.   AKI (acute kidney injury) ([place]) Hypokalemia. Hypomagnesemia   Patient tolerating well diuresis with furosemide, spironolactone and empagliflozin.   Stable renal function with serum cr at 1.1 with K at 3,7 and serum bicarbonate at 23. Mg is 2,3  Add 40 meq Kcl and follow up renal function and electrolytes in am.    Hyperthyroidism Continue with methimazole.  Follow up THS continue to be low, high free T4 and low free T3.    Essential hypertension Continue close blood pressure monitoring.   Anemia Cell count has been stable, no indication for PRBC transfusion.   Hyperglycemia hgb a1c is 5,5 patient with no diabetes mellitus. Capillary glucose has been stable and hyperglycemia has resolved.  Currently off  insulin therapy with fasting glucose 112 mg/dl.  Subjective: Patient is out of bed to chair, with no dyspnea or chest pain, left eye dryness has resolved.   Physical Exam: Vitals:   07/10/21 0500 07/10/21 0600 07/10/21 0808 07/10/21  1305  BP: 98/64  118/78 112/63  Pulse: 70 68 70 72  Resp: '18  19 18  '$ Temp: 97.7 F (36.5 C)  97.6 F (36.4 C) 97.8 F (36.6 C)  TempSrc: Oral  Oral Oral  SpO2: 96% 94% 96% 96%  Weight: 77.4 kg     Height:       Neurology awake and alert ENT with no pallor Cardiovascular with S1 and S2 present and rhythmic with no gallops, rubs or murmurs Respiratory with no rales or wheezing Abdomen not distended No lower extremity edema  Data Reviewed:    Family Communication: I spoke with patient's husband at the bedside, we talked in detail about patient's condition, plan of care and prognosis and all questions were addressed.   Disposition: Status is: Inpatient Remains inpatient appropriate because: transition from IV to oral amiodarone today, plan for possible dc home tomorrow if continue stable   Planned Discharge Destination: Home      Author: Tawni Millers, MD 07/10/2021 1:23 PM  For on call review [email or web address].

## 2021-07-10 NOTE — Progress Notes (Signed)
Patient ID: Anna Marquez, female   DOB: Dec 05, 1950, 71 y.o.   MRN: [id number]     Advanced Heart Failure Rounding Note  PCP-Cardiologist: Mertie Moores, MD   Subjective:    Failed DCCV 6/29 (was on norepinephrine still).  Successful DCCV to NSR on 7/3.  She is in NSR on amiodarone gtt this morning.   Feels good, denies dyspnea.     Objective:   Weight Range: 77.4 kg Body mass index is 29.29 kg/m.   Vital Signs:   Temp:  [97.4 F (36.3 C)-98.3 F (36.8 C)] 97.6 F (36.4 C) (07/04 0808) Pulse Rate:  [67-110] 70 (07/04 0808) Resp:  [15-25] 19 (07/04 0808) BP: (87-123)/(64-95) 118/78 (07/04 0808) SpO2:  [91 %-97 %] 96 % (07/04 0808) Weight:  [77.4 kg] 77.4 kg (07/04 0500) Last BM Date : 07/08/21  Weight change: Filed Weights   07/07/21 0357 07/08/21 0400 07/10/21 0500  Weight: 77.1 kg 78.1 kg 77.4 kg    Intake/Output:   Intake/Output Summary (Last 24 hours) at 07/10/2021 0851 Last data filed at 07/10/2021 0700 Gross per 24 hour  Intake 1605.75 ml  Output 1675 ml  Net -69.25 ml      Physical Exam    General: NAD Neck: No JVD, no thyromegaly or thyroid nodule.  Lungs: Clear to auscultation bilaterally with normal respiratory effort. CV: Nondisplaced PMI.  Heart regular S1/S2, no S3/S4, no murmur.  No peripheral edema.   Abdomen: Soft, nontender, no hepatosplenomegaly, no distention.  Skin: Intact without lesions or rashes.  Neurologic: Alert and oriented x 3.  Psych: Normal affect. Extremities: No clubbing or cyanosis.  HEENT: Normal.   Telemetry   NSR 70s (personally reviewed)  Labs    CBC Recent Labs    07/08/21 0447 07/09/21 0712  WBC 6.8 5.4  HGB 9.7* 10.0*  HCT 29.8* 29.7*  MCV 90.0 90.0  PLT 251 811   Basic Metabolic Panel Recent Labs    07/09/21 0712 07/10/21 0208  NA 138 138  K 3.6 3.7  CL 107 103  CO2 24 23  GLUCOSE 95 112*  BUN 17 19  CREATININE 0.96 1.11*  CALCIUM 8.5* 8.6*  MG 1.7 2.3   Liver Function Tests No results  for input(s): "AST", "ALT", "ALKPHOS", "BILITOT", "PROT", "ALBUMIN" in the last 72 hours.  No results for input(s): "LIPASE", "AMYLASE" in the last 72 hours.  Cardiac Enzymes No results for input(s): "CKTOTAL", "CKMB", "CKMBINDEX", "TROPONINI" in the last 72 hours.  BNP: BNP (last 3 results) Recent Labs    06/05/21 0654 07/02/21 2329  BNP 81.9 699.7*    ProBNP (last 3 results) No results for input(s): "PROBNP" in the last 8760 hours.   D-Dimer No results for input(s): "DDIMER" in the last 72 hours. Hemoglobin A1C No results for input(s): "HGBA1C" in the last 72 hours. Fasting Lipid Panel No results for input(s): "CHOL", "HDL", "LDLCALC", "TRIG", "CHOLHDL", "LDLDIRECT" in the last 72 hours.  Thyroid Function Tests No results for input(s): "TSH", "T4TOTAL", "T3FREE", "THYROIDAB" in the last 72 hours.  Invalid input(s): "FREET3"   Other results:   Imaging    No results found.   Medications:     Scheduled Medications:  amiodarone  400 mg Oral BID   apixaban  5 mg Oral BID   Chlorhexidine Gluconate Cloth  6 each Topical Daily   dapagliflozin propanediol  10 mg Oral Daily   furosemide  20 mg Oral Daily   methimazole  5 mg Oral TID   pantoprazole  40 mg Oral Daily   potassium chloride  20 mEq Oral Daily   prednisoLONE acetate  1 drop Left Eye QID   sodium chloride flush  3 mL Intravenous Q12H   spironolactone  12.5 mg Oral Daily    Infusions:  sodium chloride      PRN Medications: sodium chloride, acetaminophen, albuterol, artificial tears, fluticasone, ondansetron (ZOFRAN) IV, sodium chloride flush   Assessment/Plan   1. Acute on chronic diastolic CHF: TEE on 4/58/09 showed normal LV size with EF 60-65%, mild RV dysfunction, severe LAE with moderate-severe central MR (likely atrial functional MR). Suspect CHF exacerbation was triggered by atrial fibrillation, also mitral regurgitation may have been worsened by atrial fibrillation.  She had a recent prior  admission with CHF in the setting of atrial fibrillation.  Presentation was consistent with flash pulmonary edema.  Volume status looks ok today.  - Continue Lasix 20 mg daily and add KCl 20 daily.   - Continue Farxiga 10 mg daily.  - Continue spironolactone 12.5 daily.  - Need to keep her in NSR, decompensates in AF.  - Ischemia as a contributor to flash pulmonary edema is possible though less likely, troponin negative at admission.  Would consider LHC/RHC in the future, not urgent.  2. Atrial fibrillation: Persistent.  She was on Tikosyn in the past then had AF ablation in 2/21, Tikosyn stopped after this.  She was not on an antiarrhythmic prior to admission.  Has had increased frequency of AF recently, was admitted in 5/23 with AF and CHF exacerbation. She has also been noted to have developed hyperthyroidism prior to this admission, this can help trigger AF. Need to keep in NSR due to worsening of HF in AF.  DCCV 6/29 failed, but she was still on low dose norepinephrine. DCCV 7/3 successful, she is in NSR this morning.  - Stop IV amiodarone and start amiodarone 400 mg bid today.  - Continue Eliquis.  - Amiodarone is not an ideal medication for her (she already has hyperthyroidism).  Would like to avoid this long-term.  Will use for the time being to keep her in NSR, she will need to see EP as outpatient for a redo AF ablation allowing her to safely stop amiodarone.  3. Shock: Patient developed shock after intubation requiring vasopressin and NE.  This may have been related to sedation.  PCT 17.17 with WBCs initially 19, but WBCs now normal and patient is afebrile.  No localizing signs for infection.  Antibiotics were not started. Do not think she had septic shock.  She is now off pressors since extubation with stable BP.  4. AKI: In setting of shock, creatinine up to 2.93.  Baseline around 1.  Creatinine has been back to baseline.   5. Elevated LFTs: Suspect shock liver rather than related to  amiodarone.  Now trending down.  6.  Hyperthyroidism: Patient had this prior to admission (prior to amiodarone use).  Has multinodular goiter.  Low TSH with elevated free T4 but free T3 low this admission.   - Continue methimazole 5 mg tid.  - As above, amiodarone is not ideal but think necessary for the time being.   Transition to po amiodarone today, home tomorrow if we can keep her in NSR.  Will need to make sure she gets followup in EP clinic to evaluate for redo AF ablation.    Length of Stay: 7  [name], MD  07/10/2021, 8:51 AM  Advanced Heart Failure Team Pager [phone] (M-F; 7a -  5p)  Please contact [facility] for night-coverage after hours (5p -7a ) and weekends on [email or web address]

## 2021-07-10 NOTE — Progress Notes (Signed)
Occupational Therapy Treatment Patient Details Name: Anna Marquez MRN: [id number] DOB: 1950-08-20 Today's Date: 07/10/2021   History of present illness Pt adm 6/27 for acute on chronic diastolic heart failure, pulmonary edema, AKI,  and persistant afib.  Intubated 6/27-6/29. PMH - htn, arthritis, afib, chf   OT comments  Patient received in supine and agreeable to OT session. Patient able to get to EOB without assistance and donned socks seated on EOB with supervision. Patient ambulated to bathroom with IV pole with supervision for safety and able to perform toilet hygiene without assistance. Patient performed grooming standing at sink with no UE support and no LOB. Patient transferred to recliner and provided setup for breakfast. Acute OT to continue to follow.    Recommendations for follow up therapy are one component of a multi-disciplinary discharge planning process, led by the attending physician.  Recommendations may be updated based on patient status, additional functional criteria and insurance authorization.    Follow Up Recommendations  Home health OT    Assistance Recommended at Discharge Intermittent Supervision/Assistance  Patient can return home with the following  A little help with walking and/or transfers;A little help with bathing/dressing/bathroom;Assistance with cooking/housework;Assist for transportation   Equipment Recommendations  None recommended by OT    Recommendations for Other Services      Precautions / Restrictions Precautions Precautions: Fall Restrictions Weight Bearing Restrictions: No       Mobility Bed Mobility Overal bed mobility: Modified Independent Bed Mobility: Supine to Sit           General bed mobility comments: able to get to EOB without assistance    Transfers Overall transfer level: Independent Equipment used: None Transfers: Sit to/from Stand Sit to Stand: Independent           General transfer comment: ambulated to  bathroom with IV pole and supervision     Balance Overall balance assessment: Needs assistance Sitting-balance support: No upper extremity supported, Feet supported Sitting balance-Leahy Scale: Good     Standing balance support: No upper extremity supported Standing balance-Leahy Scale: Fair Standing balance comment: stood at sink for grooming without UE support                           ADL either performed or assessed with clinical judgement   ADL Overall ADL's : Needs assistance/impaired     Grooming: Wash/dry hands;Wash/dry face;Supervision/safety;Standing Grooming Details (indicate cue type and reason): no assistive device for balance             Lower Body Dressing: Supervision/safety;Sitting/lateral leans Lower Body Dressing Details (indicate cue type and reason): donned socks Toilet Transfer: Supervision/safety;Regular Glass blower/designer Details (indicate cue type and reason): ambulated to bathroom with IV pole and supervision Toileting- Clothing Manipulation and Hygiene: Modified independent;Sitting/lateral lean Toileting - Clothing Manipulation Details (indicate cue type and reason): no assistance for toilet hygiene       General ADL Comments: performed mobility with IV pole only    Extremity/Trunk Assessment              Vision       Perception     Praxis      Cognition Arousal/Alertness: Awake/alert Behavior During Therapy: WFL for tasks assessed/performed Overall Cognitive Status: Within Functional Limits for tasks assessed  Exercises      Shoulder Instructions       General Comments      Pertinent Vitals/ Pain       Pain Assessment Pain Assessment: Faces Faces Pain Scale: No hurt Pain Intervention(s): Monitored during session  Home Living                                          Prior Functioning/Environment              Frequency   Min 2X/week        Progress Toward Goals  OT Goals(current goals can now be found in the care plan section)  Progress towards OT goals: Progressing toward goals  Acute Rehab OT Goals Patient Stated Goal: get better OT Goal Formulation: With patient Time For Goal Achievement: 07/21/21 Potential to Achieve Goals: Good ADL Goals Pt Will Perform Grooming: standing;with modified independence Pt Will Perform Lower Body Bathing: with modified independence;with adaptive equipment Pt Will Perform Lower Body Dressing: with modified independence;sit to/from stand Pt Will Transfer to Toilet: with modified independence;ambulating;regular height toilet Pt Will Perform Toileting - Clothing Manipulation and hygiene: with modified independence;sit to/from stand  Plan Discharge plan remains appropriate    Co-evaluation                 AM-PAC OT "6 Clicks" Daily Activity     Outcome Measure   Help from another person eating meals?: None Help from another person taking care of personal grooming?: A Little Help from another person toileting, which includes using toliet, bedpan, or urinal?: A Little Help from another person bathing (including washing, rinsing, drying)?: A Little Help from another person to put on and taking off regular upper body clothing?: A Little Help from another person to put on and taking off regular lower body clothing?: A Little 6 Click Score: 19    End of Session Equipment Utilized During Treatment: Other (comment) (IV pole)  OT Visit Diagnosis: Unsteadiness on feet (R26.81);Muscle weakness (generalized) (M62.81);Other abnormalities of gait and mobility (R26.89)   Activity Tolerance Patient tolerated treatment well   Patient Left in chair;with call bell/phone within reach   Nurse Communication Mobility status        Time: 6283-6629 OT Time Calculation (min): 20 min  Charges: OT General Charges $OT Visit: 1 Visit OT Treatments $Self Care/Home  Management : 8-22 mins  Lodema Hong, [place]  Office [place] 07/10/2021, 9:05 AM

## 2021-07-10 NOTE — Progress Notes (Signed)
Patient c/o SOB, O2 sat 95%/RA, patient stated she feel better after empty her bladder. Put pt. On 2L oxygen for comfort.  MD notified via  secure chart. Will continue to monitor.

## 2021-07-10 NOTE — Progress Notes (Signed)
Physical Therapy Treatment Patient Details Name: Anna Marquez MRN: [id number] DOB: 10/17/1950 Today's Date: 07/10/2021   History of Present Illness Pt adm 6/27 for acute on chronic diastolic heart failure, pulmonary edema, AKI,  and persistant afib.  Intubated 6/27-6/29. PMH - htn, arthritis, afib, chf    PT Comments    Pt received in recliner, reports that she is feeling better but anxious about talking to MD about scheduling another ablation. Pt ambulated 200' without AD on RA with supervision with visible fatigue and 2/4 DOE last 40'. HR remained in low 80's, SPO2 in 90's on RA. Pt active at home with set routine of walking, crocheting, time with family, and tai chi starting up. Discussed self monitoring. PT will continue to follow.    Recommendations for follow up therapy are one component of a multi-disciplinary discharge planning process, led by the attending physician.  Recommendations may be updated based on patient status, additional functional criteria and insurance authorization.  Follow Up Recommendations  No PT follow up     Assistance Recommended at Discharge Intermittent Supervision/Assistance  Patient can return home with the following A little help with bathing/dressing/bathroom;Assistance with cooking/housework   Equipment Recommendations  None recommended by PT    Recommendations for Other Services       Precautions / Restrictions Precautions Precautions: Fall Restrictions Weight Bearing Restrictions: No     Mobility  Bed Mobility Overal bed mobility: Modified Independent Bed Mobility: Sit to Supine       Sit to supine: Modified independent (Device/Increase time)   General bed mobility comments: able to lift LE's against grvaity to return to supine without difficulty    Transfers Overall transfer level: Independent Equipment used: None Transfers: Sit to/from Stand Sit to Stand: Independent           General transfer comment: performing  transfers safely within room today    Ambulation/Gait Ambulation/Gait assistance: Supervision Gait Distance (Feet): 200 Feet Assistive device: None Gait Pattern/deviations: Step-through pattern, Decreased step length - right, Decreased step length - left, Decreased stride length Gait velocity: decreased Gait velocity interpretation: <1.8 ft/sec, indicate of risk for recurrent falls   General Gait Details: ambulated on RA with SPO2 remaining >90's. DOE 2/4 with noted fatigue last 40'. Pt steady with gait.   Stairs             Wheelchair Mobility    Modified Rankin (Stroke Patients Only)       Balance Overall balance assessment: Needs assistance Sitting-balance support: No upper extremity supported, Feet supported Sitting balance-Leahy Scale: Good     Standing balance support: No upper extremity supported Standing balance-Leahy Scale: Fair                              Cognition Arousal/Alertness: Awake/alert Behavior During Therapy: WFL for tasks assessed/performed Overall Cognitive Status: Within Functional Limits for tasks assessed                                          Exercises      General Comments General comments (skin integrity, edema, etc.): discussed monitoring HR and SPO2 at home as pt has pulse ox. DIscussed her weekly activities including walking and tai chi. Pt has access to pool in summer and discussed doing more water walking than street walking to help regulate HR.  Pertinent Vitals/Pain Pain Assessment Pain Assessment: No/denies pain    Home Living                          Prior Function            PT Goals (current goals can now be found in the care plan section) Acute Rehab PT Goals Patient Stated Goal: return home PT Goal Formulation: With patient Time For Goal Achievement: 07/20/21 Potential to Achieve Goals: Good Progress towards PT goals: Progressing toward goals     Frequency    Min 3X/week      PT Plan Discharge plan needs to be updated    Co-evaluation              AM-PAC PT "6 Clicks" Mobility   Outcome Measure  Help needed turning from your back to your side while in a flat bed without using bedrails?: None Help needed moving from lying on your back to sitting on the side of a flat bed without using bedrails?: None Help needed moving to and from a bed to a chair (including a wheelchair)?: None Help needed standing up from a chair using your arms (e.g., wheelchair or bedside chair)?: None Help needed to walk in hospital room?: None Help needed climbing 3-5 steps with a railing? : Total 6 Click Score: 21    End of Session Equipment Utilized During Treatment: Gait belt Activity Tolerance: Patient tolerated treatment well Patient left: with call bell/phone within reach;in bed Nurse Communication: Mobility status PT Visit Diagnosis: Other abnormalities of gait and mobility (R26.89);Muscle weakness (generalized) (M62.81)     Time: 8546-2703 PT Time Calculation (min) (ACUTE ONLY): 16 min  Charges:  $Gait Training: 8-22 mins                     Leighton Roach, PT  Acute Rehab Services Secure chat preferred Office [place] 07/10/2021, 4:27 PM

## 2021-07-11 ENCOUNTER — Other Ambulatory Visit (HOSPITAL_COMMUNITY): Payer: Self-pay

## 2021-07-11 DIAGNOSIS — N179 Acute kidney failure, unspecified: Secondary | ICD-10-CM | POA: Diagnosis not present

## 2021-07-11 DIAGNOSIS — D649 Anemia, unspecified: Secondary | ICD-10-CM | POA: Diagnosis not present

## 2021-07-11 DIAGNOSIS — I5033 Acute on chronic diastolic (congestive) heart failure: Secondary | ICD-10-CM | POA: Diagnosis not present

## 2021-07-11 DIAGNOSIS — R739 Hyperglycemia, unspecified: Secondary | ICD-10-CM | POA: Diagnosis not present

## 2021-07-11 LAB — BASIC METABOLIC PANEL
Anion gap: 11 (ref 5–15)
BUN: 19 mg/dL (ref 8–23)
CO2: 21 mmol/L — ABNORMAL LOW (ref 22–32)
Calcium: 8.8 mg/dL — ABNORMAL LOW (ref 8.9–10.3)
Chloride: 106 mmol/L (ref 98–111)
Creatinine, Ser: 1.14 mg/dL — ABNORMAL HIGH (ref 0.44–1.00)
GFR, Estimated: 51 mL/min — ABNORMAL LOW (ref >60)
Glucose, Bld: 95 mg/dL (ref 70–99)
Potassium: 4.1 mmol/L (ref 3.5–5.1)
Sodium: 138 mmol/L (ref 135–145)

## 2021-07-11 SURGERY — RIGHT/LEFT HEART CATH AND CORONARY ANGIOGRAPHY
Anesthesia: LOCAL

## 2021-07-11 MED ORDER — POTASSIUM CHLORIDE CRYS ER 20 MEQ PO TBCR
20.0000 meq | EXTENDED_RELEASE_TABLET | Freq: Every day | ORAL | 0 refills | Status: DC
  Filled 2021-07-11: qty 30, 30d supply, fill #0

## 2021-07-11 MED ORDER — DAPAGLIFLOZIN PROPANEDIOL 10 MG PO TABS
10.0000 mg | ORAL_TABLET | Freq: Every day | ORAL | 0 refills | Status: DC
  Filled 2021-07-11: qty 30, 30d supply, fill #0

## 2021-07-11 MED ORDER — SPIRONOLACTONE 25 MG PO TABS
12.5000 mg | ORAL_TABLET | Freq: Every day | ORAL | 0 refills | Status: DC
  Filled 2021-07-11: qty 15, 30d supply, fill #0

## 2021-07-11 MED ORDER — FUROSEMIDE 20 MG PO TABS
20.0000 mg | ORAL_TABLET | Freq: Every day | ORAL | 0 refills | Status: DC
  Filled 2021-07-11: qty 30, 30d supply, fill #0

## 2021-07-11 MED ORDER — AMIODARONE HCL 200 MG PO TABS
ORAL_TABLET | ORAL | 0 refills | Status: DC
  Filled 2021-07-11: qty 60, 30d supply, fill #0

## 2021-07-11 NOTE — Discharge Summary (Signed)
Physician Discharge Summary   Patient: Anna Marquez MRN: [id number] DOB: 1950-11-01  Admit date:     07/02/2021  Discharge date: 07/11/21  Discharge Physician: Anna Marquez   PCP: Anna Nutting, DO   Recommendations at discharge:    Patient has been placed on amiodarone for atrial fibrillation rhythm control.  Follow up with EP as outpatient Continue methimazole and follow up on thyroid function in 2 weeks.  Continue diuresis with furosemide, spironolactone and SGLT2 inh.   Discharge Diagnoses: Principal Problem:   Acute on chronic diastolic CHF (congestive heart failure) (HCC) Active Problems:   Persistent atrial fibrillation (HCC)   AKI (acute kidney injury) ([place])   Hyperthyroidism   Essential hypertension   Anemia   Hyperglycemia  Resolved Problems:   * No resolved hospital problems. [facility] Course: Anna Marquez was admitted to the hospital with the working diagnosis of decompensated heart failure in the setting of uncontrolled atrial fibrillation and acute hyperthyroidism.   71 yo female with the past medical history of heart failure and atrial fibrillation sp ablation who presented with dyspnea. Recent hospitalization 05/30 to 06/09/21 due to heart failure with atrial fibrillation. Reported 2 days of dyspnea. She was evaluated at a urgent care and received prednisone for bronchitis. On her initial physical examination her blood pressure was 104/77, HR 108, rr 22 and 02 saturation 94%, lungs with rales bilaterally but no wheezing, heart with S1 and S2 present, irregularly irregular, abdomen not distended and no lower extremity edema.   Na 136, K 3,4 CL 105 bicarbonate 15 glucose 299 bun 27 cr 1,58  BNP 699 High sensitive troponin 6  Wbc 12,9 hgb 10,9 plt 330  Urine analysis with SG 1,024 > 300 protein, 21-50 wbc. 11-20 rbc   Chest radiograph with cardiomegaly, with bilateral hilar vascular congestin, bilateral interstitial infiltrates, more at the bases.    EKG 99 bpm, normal axis, normal qtc, atrial fibrillation rhythm, no significant ST segment or T wave changes.   Patient was placed on furosemide for diuresis and added IV amiodarone for rate control.  She developed acute respiratory failure and was placed on non invasive mechanical ventilation.  Developed respiratory arrest and underwent invasive mechanical ventilation.  Circulatory shock requiring vasopressors.   06/29 TEE cardioversion.  06/29 liberated from mechanical ventilation   07/01 transferred to [facility].   07/02 patient continue in atrial fibrillation rhythm 07/03 direct current cardioversion converting to sinus rhythm. 07/04 transition to oral amiodarone and plan to discharge home in am if continue stable.   Patient will follow up with electrophysiology as outpatient.   Assessment and Plan: * Acute on chronic diastolic CHF (congestive heart failure) (HCC) Echocardiogram with preserved LV systolic function with EF 50 to 55%, moderate reduction in RV systolic function, RVSP 38,7. Small pericardial effusion. Moderate TR and MR.   Acute on chronic core pulmonale.  Circulatory shock (possible due to sedation during mechanical ventilation), now off vasopressors.   Patient had diuresis, negative fluid balance was achieved with significant improvement in her symptoms.   Continue heart failure management with dapagliflozin, furosemide and spironolactone.  Follow up as outpatient, holding ARB or ARNI due to risk of hypotension.  No B blockade patient on amiodarone.   Persistent atrial fibrillation (Anna Marquez) 06/30 DC cardioversion  07/03 DC cardioversion.   Patient required 2 cardioversions, at the time of her discharge she is on sinus rhythm.   Continue with amiodarone load as outpatient and anticoagulation with apixaban.  Follow up with EP  as outpatient, amiodarone not ideal medication in the setting of hyperthyroidism.   AKI (acute kidney injury) ([place]) Hypokalemia.  Hypomagnesemia   Patient had diuresis with furosemide, spironolactone and empagliflozin.   At the time of her discharge her renal function is stable with serum cr at 1.14 with K  at 4,1 and serum bicarbonate at 21. Plan to continue diuresis as outpatient and follow up renal function as outpatient.   Hyperthyroidism Continue with methimazole.  Follow up THS continue to be low, high free T4 and low free T3.    Essential hypertension Continue close blood pressure monitoring.   Anemia Cell count has been stable, no indication for PRBC transfusion.   Hyperglycemia hgb a1c is 5,5 patient with no diabetes mellitus. Capillary glucose has been stable and hyperglycemia has resolved.  She did received transitory short acting insulin during her hospitalization.          Consultants: cardiology, pulmonary  Procedures performed: cardioversion x2   Disposition: Home Diet recommendation:  Cardiac diet DISCHARGE MEDICATION: Allergies as of 07/11/2021       Reactions   Erythromycin    Upset stomach   Nsaids Other (See Comments)   Has a history of bleeding ulcers, tolerates aspirin    Penicillins Hives, Shortness Of Breath   Did it involve swelling of the face/tongue/throat, SOB, or low BP? Yes Did it involve sudden or severe rash/hives, skin peeling, or any reaction on the inside of your mouth or nose? No Did you need to seek medical attention at a hospital or doctor's office? No When did it last happen?      45 years If all above answers are "NO", may proceed with cephalosporin use.        Medication List     STOP taking these medications    diltiazem 180 MG 24 hr capsule Commonly known as: Cardizem CD   erythromycin ophthalmic ointment   levofloxacin 500 MG tablet Commonly known as: LEVAQUIN   nadolol 40 MG tablet Commonly known as: Corgard   potassium chloride 10 MEQ tablet Commonly known as: KLOR-CON   predniSONE 20 MG tablet Commonly known as: DELTASONE        TAKE these medications    acetaminophen 500 MG tablet Commonly known as: TYLENOL Take 1,000 mg by mouth every 6 (six) hours as needed for moderate pain or headache.   albuterol 108 (90 Base) MCG/ACT inhaler Commonly known as: VENTOLIN HFA INHALE 1 TO 2 PUFFS INTO THE LUNGS EVERY 6 HOURS AS NEEDED FOR WHEEZING OR SHORTNESS OF BREATH. What changed: See the new instructions.   albuterol (2.5 MG/3ML) 0.083% nebulizer solution Commonly known as: PROVENTIL Take 3 mLs (2.5 mg total) by nebulization every 4 (four) hours as needed for wheezing or shortness of breath (please include nebulizer machine, hoses, and mask if needed.). What changed: Another medication with the same name was changed. Make sure you understand how and when to take each.   amiodarone 200 MG tablet Commonly known as: PACERONE Take 2 tablets twice daily until 07/12, then on 07/13 take 1 tablet twice daily until 07/19, then on 07/20 start taking on tablet daily.   chlorpheniramine-HYDROcodone 10-8 MG/5ML Commonly known as: Tussionex Pennkinetic ER Take 5 mLs by mouth every 12 (twelve) hours as needed for cough.   dapagliflozin propanediol 10 MG Tabs tablet Commonly known as: FARXIGA Take 1 tablet (10 mg total) by mouth daily. Start taking on: July 12, 2021   diclofenac Sodium 1 % Gel Commonly known as:  VOLTAREN Apply 2-4 g topically 4 (four) times daily as needed (knees,ankles and hand pain).   Eliquis 5 MG Tabs tablet Generic drug: apixaban TAKE 1 TABLET BY MOUTH 2 TIMES DAILY. What changed: how much to take   ferrous sulfate 325 (65 FE) MG tablet Take 325 mg by mouth every other day.   fluticasone 50 MCG/ACT nasal spray Commonly known as: FLONASE PLACE 1 SPRAY INTO BOTH NOSTRILS AS NEEDED FOR ALLERGIES OR RHINITIS. What changed: when to take this   furosemide 20 MG tablet Commonly known as: LASIX Take 1 tablet (20 mg total) by mouth daily. Start taking on: July 12, 2021 What changed:  when to take  this reasons to take this   loratadine 10 MG tablet Commonly known as: CLARITIN Take 10 mg by mouth daily.   methimazole 5 MG tablet Commonly known as: TAPAZOLE Take 1 tablet (5 mg total) by mouth 3 (three) times daily.   pantoprazole 40 MG tablet Commonly known as: PROTONIX TAKE 1 TABLET (40 MG TOTAL) BY MOUTH DAILY.   potassium chloride SA 20 MEQ tablet Commonly known as: KLOR-CON M Take 1 tablet (20 mEq total) by mouth daily. Start taking on: July 12, 2021   prednisoLONE acetate 1 % ophthalmic suspension Commonly known as: PRED FORTE Place 1 drop into the left eye 4 (four) times daily.   PROBIOTIC PO Take 1 capsule by mouth daily.   spironolactone 25 MG tablet Commonly known as: ALDACTONE Take 0.5 tablets (12.5 mg total) by mouth daily. Start taking on: July 12, 2021   vitamin C 1000 MG tablet Take 1,000 mg by mouth daily.   Vitamin D 50 MCG (2000 UT) tablet Take 2,000 Units by mouth daily.   vitamin E 180 MG (400 UNITS) capsule Take 400 Units by mouth daily.   ZINC CITRATE PO Take 22 mg by mouth daily.        Follow-up Information     Anna Nutting, DO Follow up.   Specialty: Family Medicine Contact information: [address] [place] [address] [place] Follow up.   Specialty: Cardiology Why: 7/18 at 12:00 PM   [facility] [place] (Dr. Claris Gladden office) Contact information: [address] [id number] [place] [address] [phone]        Constance Haw, MD Follow up.   Specialty: Cardiology Why: 7/25 at 2:00 PM   To discuss atrial fibrillation ablation Contact information: [address] [place] [address] [phone]                Discharge Exam: Danley Danker Weights   07/08/21 0400 07/10/21 0500 07/11/21 0525  Weight: 78.1 kg 77.4 kg 76.3 kg    BP 121/73 (BP Location: Left Arm)   Pulse 70   Temp 98.4 F (36.9 C) (Oral)   Resp 20   Ht '5\' 4"'$  (1.626 m)   Wt 76.3 kg Comment: scale c  SpO2 97%   BMI 28.87 kg/m    Patient is feeling better with no dyspnea or chest pain, no edema or palpitations  Neurology awake and alert ENT with no pallor Cardiovascular with S1 and S2 present and rhythmic with no gallops, rubs or murmurs Respiratory with no wheezing or rales Abdomen not distended No lower extremity edema   Condition at discharge: stable  The results of significant  diagnostics from this hospitalization (including imaging, microbiology, ancillary and laboratory) are listed below for reference.   Imaging Studies: ECHO TEE  Result Date: 07/05/2021    TRANSESOPHOGEAL ECHO REPORT   Patient Name:   Anna Marquez Date of Exam: 07/05/2021 Medical Rec #:  [id number]      Height:       64.0 in Accession #:    [id number]     Weight:       171.7 lb Date of Birth:  April 10, 1950      BSA:          1.834 m Patient Age:    23 years       BP:           112/65 mmHg Patient Gender: F              HR:           100 bpm. Exam Location:  Inpatient Procedure: Transesophageal Echo, Color Doppler, Cardiac Doppler and 3D Echo Indications:     I48.91* Unspeicified atrial fibrillation  History:         Patient has prior history of Echocardiogram examinations, most                  recent 07/04/2021. Mitral Valve Prolapse, Arrythmias:Atrial                  Fibrillation; Risk Factors:Hypertension and GERD.  Sonographer:     Bernadene Person RDCS Referring Phys:  [id number] Candee Furbish Diagnosing Phys: Franki Monte PROCEDURE: After discussion of the risks and benefits of a TEE, an informed consent was obtained from a family member. The transesophogeal probe was passed without difficulty through the esophogus of the patient. Sedation performed by performing physician. Patients was under conscious sedation during this procedure. Anesthetic administered: 238mg of  Fentanyl. The patient's vital signs; including heart rate, blood pressure, and oxygen saturation; remained stable throughout the procedure. The patient developed no complications during the procedure. An unsuccessful direct current cardioversion was performed at 200 joules with 2 attempts. IMPRESSIONS  1. Left ventricular ejection fraction, by estimation, is 55 to 60%. The left ventricle has normal function. There is mild left ventricular hypertrophy.  2. Peak RV-RA gradient 22 mmHg. Right ventricular systolic function is mildly reduced. The right ventricular size is normal.  3. Left atrial size was severely dilated. No left atrial/left atrial appendage thrombus was detected.  4. Right atrial size was moderately dilated.  5. The mitral valve is abnormal. Moderate to severe central mitral valve regurgitation. PISA ERO 0.35 cm^2, vena contracta area from 3-D imaging 0.26 cm^2. Suspect functional MR, likely atrial functional MR with dilated left atrium. No evidence of mitral stenosis.  6. The aortic valve is tricuspid. Aortic valve regurgitation is trivial. No aortic stenosis is present.  7. No PFO or ASD by color doppler. FINDINGS  Left Ventricle: Left ventricular ejection fraction, by estimation, is 55 to 60%. The left ventricle has normal function. The left ventricular internal cavity size was normal in size. There is mild left ventricular hypertrophy. Right Ventricle: Peak RV-RA gradient 22 mmHg. The right ventricular size is normal. No increase in right ventricular wall thickness. Right ventricular systolic function is mildly reduced. Left Atrium: Left atrial size was severely dilated. No left atrial/left atrial appendage thrombus was detected. Right Atrium: Right atrial size was moderately dilated. Pericardium: There is no evidence of pericardial effusion. Mitral Valve: The mitral valve is abnormal. Moderate to severe mitral valve regurgitation.  No evidence of mitral valve stenosis. Tricuspid Valve: The tricuspid  valve is normal in structure. Tricuspid valve regurgitation is mild. Aortic Valve: The aortic valve is tricuspid. Aortic valve regurgitation is trivial. No aortic stenosis is present. Pulmonic Valve: The pulmonic valve was normal in structure. Pulmonic valve regurgitation is trivial. Aorta: The aortic root is normal in size and structure. IAS/Shunts: No PFO or ASD by color doppler.  MR Peak grad:    75.0 mmHg MR Mean grad:    52.0 mmHg MR Vmax:         433.00 cm/s MR Vmean:        339.0 cm/s MR PISA:         3.08 cm MR PISA Eff ROA: 28 mm MR PISA Radius:  0.70 cm Dalton McleanMD Electronically signed by Franki Monte Signature Date/Time: 07/05/2021/3:12:11 PM    Final    US Abdomen Limited RUQ (LIVER/GB)  Result Date: 07/05/2021 CLINICAL DATA:  Abnormal liver function tests EXAM: ULTRASOUND ABDOMEN LIMITED RIGHT UPPER QUADRANT COMPARISON:  None Available. FINDINGS: Gallbladder: Gallbladder is not seen consistent with cholecystectomy. Common bile duct: Diameter: 7.5 mm. There is no significant dilation of intrahepatic bile ducts. Liver: There is slightly increased echogenicity in the liver. There is mild nodularity in the liver surface. No focal abnormality is seen in the visualized portions of liver. Portal vein is patent on color Doppler imaging with normal direction of blood flow towards the liver. Other: Trace amount of fluid is noted adjacent to the right kidney. IMPRESSION: Status post cholecystectomy. There is prominence of proximal common bile duct possibly due to post cholecystectomy state. Increased echogenicity in the liver suggests fatty infiltration. There is mild nodularity in the liver surface suggesting possible cirrhosis. Electronically Signed   By: Elmer Picker M.D.   On: 07/05/2021 10:02   VAS [place] LOWER EXTREMITY VENOUS (DVT)  Result Date: 07/04/2021  Lower Venous DVT Study Patient Name:  Anna Marquez  Date of Exam:   07/04/2021 Medical Rec #: [id number]       Accession #:     [id number] Date of Birth: [birth year]       Patient Gender: F Patient Age:   65 years Exam Location:  [facility] Procedure:      VAS [place] LOWER EXTREMITY VENOUS (DVT) Referring Phys: Ina Homes --------------------------------------------------------------------------------  Indications: SOB.  Comparison Study: No prior studies. Performing Technologist: Darlin Coco RDMS, RVT  Examination Guidelines: A complete evaluation includes B-mode imaging, spectral Doppler, color Doppler, and power Doppler as needed of all accessible portions of each vessel. Bilateral testing is considered an integral part of a complete examination. Limited examinations for reoccurring indications may be performed as noted. The reflux portion of the exam is performed with the patient in reverse Trendelenburg.  +---------+---------------+---------+-----------+----------+--------------+ RIGHT    CompressibilityPhasicitySpontaneityPropertiesThrombus Aging +---------+---------------+---------+-----------+----------+--------------+ CFV      Full           Yes      Yes                                 +---------+---------------+---------+-----------+----------+--------------+ SFJ      Full                                                        +---------+---------------+---------+-----------+----------+--------------+  FV Prox  Full                                                        +---------+---------------+---------+-----------+----------+--------------+ FV Mid   Full                                                        +---------+---------------+---------+-----------+----------+--------------+ FV DistalFull                                                        +---------+---------------+---------+-----------+----------+--------------+ PFV      Full                                                        +---------+---------------+---------+-----------+----------+--------------+ POP       Full           Yes      Yes                                 +---------+---------------+---------+-----------+----------+--------------+ PTV      Full                                                        +---------+---------------+---------+-----------+----------+--------------+ PERO     Full                                                        +---------+---------------+---------+-----------+----------+--------------+   +---------+---------------+---------+-----------+----------+--------------+ LEFT     CompressibilityPhasicitySpontaneityPropertiesThrombus Aging +---------+---------------+---------+-----------+----------+--------------+ CFV      Full           Yes      Yes                                 +---------+---------------+---------+-----------+----------+--------------+ SFJ      Full                                                        +---------+---------------+---------+-----------+----------+--------------+ FV Prox  Full                                                        +---------+---------------+---------+-----------+----------+--------------+  FV Mid   Full                                                        +---------+---------------+---------+-----------+----------+--------------+ FV DistalFull                                                        +---------+---------------+---------+-----------+----------+--------------+ PFV      Full                                                        +---------+---------------+---------+-----------+----------+--------------+ POP      Full           Yes      Yes                                 +---------+---------------+---------+-----------+----------+--------------+ PTV      Full                                                        +---------+---------------+---------+-----------+----------+--------------+ PERO     Full                                                         +---------+---------------+---------+-----------+----------+--------------+     Summary: RIGHT: - There is no evidence of deep vein thrombosis in the lower extremity.  - No cystic structure found in the popliteal fossa.  LEFT: - There is no evidence of deep vein thrombosis in the lower extremity.  - No cystic structure found in the popliteal fossa.  *See table(s) above for measurements and observations. Electronically signed by Orlie Pollen on 07/04/2021 at 5:28:12 PM.    Final    NM Pulmonary Perfusion  Result Date: 07/04/2021 CLINICAL DATA:  Pulmonary embolism suspected, high probability. Developed acute hypoxic respiratory failure. EXAM: NUCLEAR MEDICINE PERFUSION LUNG SCAN TECHNIQUE: Perfusion images were obtained in multiple projections after intravenous injection of radiopharmaceutical. Ventilation scans intentionally deferred if perfusion scan and chest x-ray adequate for interpretation during COVID 19 epidemic. RADIOPHARMACEUTICALS:  4.2 mCi Tc-56mMAA IV COMPARISON:  Chest radiograph 07/04/2021 FINDINGS: Normal perfusion in both lungs. No significant peripheral or wedge-shaped perfusion abnormalities. IMPRESSION: Normal perfusion examination.  No evidence for pulmonary embolism. Electronically Signed   By: AMarkus DaftM.D.   On: 07/04/2021 14:54   [place]  Result Date: 07/04/2021 CLINICAL DATA:  Acute kidney injury, history hypertension EXAM: RENAL / URINARY TRACT ULTRASOUND COMPLETE COMPARISON:  01/11/2021 FINDINGS: Right Kidney: Renal measurements: 12.7 x 4.6 x 4.8 cm = volume: 144 mL. Cortical thinning. Normal cortical echogenicity. No mass, hydronephrosis, or shadowing  calcification. Left Kidney: Renal measurements: 13.5 x 5.6 x 5.2 cm = volume: 207 mL. Cortical thinning. Normal cortical echogenicity. No mass, hydronephrosis, or shadowing calcification. Bladder: Decompressed by Foley catheter, unable to evaluate. Other: None. IMPRESSION: BILATERAL renal cortical thinning.  Otherwise negative exam. Electronically Signed   By: Lavonia Dana M.D.   On: 07/04/2021 10:47   ECHOCARDIOGRAM LIMITED  Result Date: 07/04/2021    ECHOCARDIOGRAM LIMITED REPORT   Patient Name:   Anna Marquez Date of Exam: 07/04/2021 Medical Rec #:  [id number]      Height:       64.0 in Accession #:    [id number]     Weight:       175.0 lb Date of Birth:  04/09/1950      BSA:          1.849 m Patient Age:    65 years       BP:           119/68 mmHg Patient Gender: F              HR:           74 bpm. Exam Location:  Inpatient Procedure: Limited Echo, Color Doppler and Cardiac Doppler STAT ECHO Indications:    Mitral Valve Disorder I05.9  History:        Patient has prior history of Echocardiogram examinations, most                 recent 01/25/2021. Mitral Valve Prolapse, Arrythmias:Atrial                 Fibrillation; Risk Factors:Hypertension.  Sonographer:    Darlina Sicilian RDCS Referring Phys: [id number] [place]  1. Left ventricular ejection fraction, by estimation, is 50 to 55%. The left ventricle has low normal function. The left ventricle has no regional wall motion abnormalities.  2. Right ventricular systolic function is moderately reduced. The right ventricular size is mildly enlarged. There is normal pulmonary artery systolic pressure. The estimated right ventricular systolic pressure is 37.1 mmHg.  3. A small pericardial effusion is present. The pericardial effusion is surrounding the apex and localized near the right ventricle. There is no evidence of cardiac tamponade.  4. PISA 0.2 cm. The mitral valve is normal in structure. Mild to moderate mitral valve regurgitation. No evidence of mitral stenosis.  5. Tricuspid valve regurgitation is mild to moderate.  6. The aortic valve is normal in structure. Aortic valve regurgitation is not visualized. No aortic stenosis is present.  7. The inferior vena cava is dilated in size with >50% respiratory variability, suggesting right atrial  pressure of 8 mmHg. Comparison(s): Prior images reviewed side by side. RV size in increased and function reduced when compared to 01/25/2021. FINDINGS  Left Ventricle: Left ventricular ejection fraction, by estimation, is 50 to 55%. The left ventricle has low normal function. The left ventricle has no regional wall motion abnormalities. The left ventricular internal cavity size was normal in size. There is no left ventricular hypertrophy. Right Ventricle: The right ventricular size is mildly enlarged. No increase in right ventricular wall thickness. Right ventricular systolic function is moderately reduced. There is normal pulmonary artery systolic pressure. The tricuspid regurgitant velocity is 2.48 m/s, and with an assumed right atrial pressure of 8 mmHg, the estimated right ventricular systolic pressure is 69.6 mmHg. Left Atrium: Left atrial size was normal in size. Right Atrium: Right atrial size was normal in size. Pericardium: A small pericardial  effusion is present. The pericardial effusion is surrounding the apex and localized near the right ventricle. There is no evidence of cardiac tamponade. Mitral Valve: PISA 0.2 cm. The mitral valve is normal in structure. Mild mitral annular calcification. Mild to moderate mitral valve regurgitation. No evidence of mitral valve stenosis. Tricuspid Valve: The tricuspid valve is normal in structure. Tricuspid valve regurgitation is mild to moderate. No evidence of tricuspid stenosis. Aortic Valve: The aortic valve is normal in structure. Aortic valve regurgitation is not visualized. No aortic stenosis is present. Pulmonic Valve: The pulmonic valve was normal in structure. Pulmonic valve regurgitation is not visualized. No evidence of pulmonic stenosis. Aorta: The aortic root is normal in size and structure. Venous: The inferior vena cava is dilated in size with greater than 50% respiratory variability, suggesting right atrial pressure of 8 mmHg. IAS/Shunts: No atrial  level shunt detected by color flow Doppler. LEFT VENTRICLE PLAX 2D LVOT diam:     2.10 cm LV SV:         28 LV SV Index:   15 LVOT Area:     3.46 cm  LV Volumes (MOD) LV vol d, MOD A2C: 53.6 ml LV vol d, MOD A4C: 61.6 ml LV vol s, MOD A2C: 31.6 ml LV vol s, MOD A4C: 26.4 ml LV SV MOD A2C:     22.0 ml LV SV MOD A4C:     61.6 ml LV SV MOD BP:      29.9 ml AORTIC VALVE LVOT Vmax:   60.70 cm/s LVOT Vmean:  30.900 cm/s LVOT VTI:    0.080 m MITRAL VALVE                  TRICUSPID VALVE MV Area (PHT): 2.50 cm       TR Peak grad:   24.6 mmHg MV Decel Time: 304 msec       TR Vmax:        248.00 cm/s MR Peak grad:    85.7 mmHg MR Mean grad:    49.0 mmHg    SHUNTS MR Vmax:         463.00 cm/s  Systemic VTI:  0.08 m MR Vmean:        322.0 cm/s   Systemic Diam: 2.10 cm MR PISA:         0.25 cm MR PISA Eff ROA: 1 mm MR PISA Radius:  0.20 cm MV E velocity: 47.60 cm/s Candee Furbish MD Electronically signed by Candee Furbish MD Signature Date/Time: 07/04/2021/10:18:04 AM    Final    DG Chest Port 1 View  Result Date: 07/04/2021 CLINICAL DATA:  ARDS, shortness of breath EXAM: PORTABLE CHEST 1 VIEW COMPARISON:  Chest radiograph 1 day prior FINDINGS: The endotracheal tube tip is approximately 2.7 cm from the carina. The left IJ vascular catheter terminates in the lower SVC. The enteric catheter is coiled in the stomach The cardiomediastinal silhouette is stable. There is persistent retrocardiac consolidation. Opacities in the right base and perihilar regions are improved in the interim. There is no significant pleural effusion. There is no pneumothorax The bones are stable. IMPRESSION: Persistent retrocardiac consolidation but improved opacities in the right base and perihilar regions. No significant pleural effusion. Electronically Signed   By: Valetta Mole M.D.   On: 07/04/2021 08:09   Portable Chest x-ray  Result Date: 07/03/2021 CLINICAL DATA:  Provided history: ET tube placement. EXAM: PORTABLE CHEST 1 VIEW COMPARISON:   Prior chest radiographs 07/02/2021 and earlier. FINDINGS: Interval  intubation. The ET tube terminates 2.3 cm above the level of the carina. Interval placement of a left IJ approach central venous catheter with tip projecting at the level of the superior cavoatrial junction. Partially visualized enteric tube, passing below the level of the left hemidiaphragm. The tip of the enteric tube appears to re-enter the field of view and terminate in the region of the gastric cardia. Borderline cardiomegaly. Bilateral interstitial and airspace opacities, greatest within the left mid lung field and right mid to lower lung field, progressed. No evidence of pleural effusion or pneumothorax. No acute bony abnormality identified. IMPRESSION: Positions of lines and tubes, as described. Bilateral interstitial and airspace opacities, progressed from the prior chest radiograph of 07/02/2021. This may reflect pulmonary edema or pneumonia. Borderline cardiomegaly. Electronically Signed   By: Kellie Simmering D.O.   On: 07/03/2021 15:11   DG Abd 1 View  Result Date: 07/03/2021 CLINICAL DATA:  Orogastric tube placement EXAM: ABDOMEN - 1 VIEW COMPARISON:  Portable exam 1459 hours compared 09/01/2020 FINDINGS: Nasogastric tube coiled in proximal stomach. Surgical clips RIGHT upper quadrant likely reflect prior cholecystectomy. Patchy infiltrates mid to lower LEFT lung with mild RIGHT basilar atelectasis. IMPRESSION: Nasogastric tube coiled in proximal stomach. Electronically Signed   By: Lavonia Dana M.D.   On: 07/03/2021 15:09   DG Chest 2 View  Result Date: 07/03/2021 CLINICAL DATA:  Shortness of breath and cough. EXAM: CHEST - 2 VIEW COMPARISON:  Jun 05, 2021 FINDINGS: The cardiac silhouette is mildly enlarged. Moderate to marked severity diffusely increased interstitial lung markings are seen with mild areas of atelectasis noted within the bilateral lung bases. Mild perihilar prominence of the pulmonary vasculature is seen. There is  no evidence of a pleural effusion or pneumothorax. The visualized skeletal structures are unremarkable. IMPRESSION: 1. Mild cardiomegaly with moderate to marked severity interstitial edema and mild bibasilar atelectasis. Electronically Signed   By: [place]gina Norfolk M.D.   On: 07/03/2021 00:01    Microbiology: Results for orders placed or performed during the hospital encounter of 07/02/21  MRSA Next Gen by PCR, Nasal     Status: None   Collection Time: 07/03/21  3:47 PM   Specimen: Nasal Mucosa; Nasal Swab  Result Value Ref Range Status   MRSA by PCR Next Gen NOT DETECTED NOT DETECTED Final    Comment: (NOTE) The GeneXpert MRSA Assay (FDA approved for NASAL specimens only), is one component of a comprehensive MRSA colonization surveillance program. It is not intended to diagnose MRSA infection nor to guide or monitor treatment for MRSA infections. Test performance is not FDA approved in patients less than 89 years old. Performed at [facility], [place] [address], [place], [place] [address]     Labs: CBC: Recent Labs  Lab 07/05/21 0100 07/06/21 0350 07/07/21 0314 07/08/21 0447 07/09/21 0712  WBC 9.5 9.2 7.3 6.8 5.4  HGB 9.5* 9.7* 9.6* 9.7* 10.0*  HCT 27.5* 29.4* 29.5* 29.8* 29.7*  MCV 87.3 89.6 89.7 90.0 90.0  PLT 250 253 248 251 546   Basic Metabolic Panel: Recent Labs  Lab 07/05/21 0100 07/05/21 1354 07/06/21 0350 07/07/21 0314 07/08/21 0447 07/09/21 0712 07/10/21 0208 07/11/21 0236  NA 139   < > 144 142 141 138 138 138  K 2.7*   < > 3.6 3.4* 3.7 3.6 3.7 4.1  CL 100   < > 108 107 107 107 103 106  CO2 24   < > '27 26 27 24 23 '$ 21*  GLUCOSE  220*   < > 108* 110* 108* 95 112* 95  BUN 46*   < > 34* 28* '21 17 19 19  '$ CREATININE 1.67*   < > 1.14* 1.04* 1.06* 0.96 1.11* 1.14*  CALCIUM 7.9*   < > 8.5* 8.8* 8.9 8.5* 8.6* 8.8*  MG 1.8  --  2.1  --   --  1.7 2.3  --   PHOS 3.1  --   --   --   --   --   --   --    < > = values in this interval not displayed.   Liver  Function Tests: Recent Labs  Lab 07/05/21 0100 07/06/21 0350  AST 291* 93*  ALT 556* 333*  ALKPHOS 72 67  BILITOT 1.3* 1.5*  PROT 5.9* 6.5  ALBUMIN 2.5* 2.4*   CBG: Recent Labs  Lab 07/08/21 2303 07/09/21 0348 07/09/21 0745 07/09/21 1103 07/09/21 1559  GLUCAP 99 109* 113* 112* 175*    Discharge time spent: greater than 30 minutes.  Signed: Tawni Millers, MD [facility] 07/11/2021

## 2021-07-11 NOTE — TOC Transition Note (Addendum)
Transition of Care [facility]) - CM/SW Discharge Note   Patient Details  Name: Anna Marquez MRN: [id number] Date of Birth: September 06, 1950  Transition [facility] Care [facility]) CM/SW Contact:  Zenon Mayo, RN Phone Number: 07/11/2021, 11:00 AM   Clinical Narrative:    Patient is for dc today, has no needs per previous NCM note, does not want [place] services.     Barriers to Discharge: Continued Medical Work up   Patient Goals and CMS Choice Patient states their goals for this hospitalization and ongoing recovery are:: wants to get back to baseline CMS Medicare.gov Compare Post Acute Care list provided to:: Patient Choice offered to / list presented to : Patient  Discharge Placement                       Discharge Plan and Services   Discharge Planning Services: CM Consult Post Acute Care Choice: Home Health                               Social Determinants of Health (SDOH) Interventions     Readmission Risk Interventions     No data to display

## 2021-07-11 NOTE — Progress Notes (Signed)
Patient ID: Anna Marquez, female   DOB: [birth year], 71 y.o.   MRN: [id number]     Advanced Heart Failure Rounding Note  PCP-Cardiologist: Mertie Moores, MD   Subjective:    Failed DCCV 6/29 (was on norepinephrine still).  Successful DCCV to NSR on 7/3.  She is in NSR po amiodarone this morning.   Feels good, denies dyspnea.     Objective:   Weight Range: 76.3 kg Body mass index is 28.87 kg/m.   Vital Signs:   Temp:  [97.8 F (36.6 C)-98.6 F (37 C)] 98.4 F (36.9 C) (07/05 0721) Pulse Rate:  [69-77] 70 (07/05 0721) Resp:  [18-20] 20 (07/05 0721) BP: (112-133)/(63-91) 131/84 (07/05 0721) SpO2:  [94 %-97 %] 95 % (07/05 0721) Weight:  [76.3 kg] 76.3 kg (07/05 0525) Last BM Date : 07/10/21  Weight change: Filed Weights   07/08/21 0400 07/10/21 0500 07/11/21 0525  Weight: 78.1 kg 77.4 kg 76.3 kg    Intake/Output:   Intake/Output Summary (Last 24 hours) at 07/11/2021 0847 Last data filed at 07/11/2021 0500 Gross per 24 hour  Intake 76.28 ml  Output 2250 ml  Net -2173.72 ml      Physical Exam    General: NAD Neck: No JVD, no thyromegaly or thyroid nodule.  Lungs: Clear to auscultation bilaterally with normal respiratory effort. CV: Nondisplaced PMI.  Heart regular S1/S2, no S3/S4, no murmur.  No peripheral edema.   Abdomen: Soft, nontender, no hepatosplenomegaly, no distention.  Skin: Intact without lesions or rashes.  Neurologic: Alert and oriented x 3.  Psych: Normal affect. Extremities: No clubbing or cyanosis.  HEENT: Normal.   Telemetry   NSR 70s (personally reviewed)  Labs    CBC Recent Labs    07/09/21 0712  WBC 5.4  HGB 10.0*  HCT 29.7*  MCV 90.0  PLT 751   Basic Metabolic Panel Recent Labs    07/09/21 0712 07/10/21 0208 07/11/21 0236  NA 138 138 138  K 3.6 3.7 4.1  CL 107 103 106  CO2 24 23 21*  GLUCOSE 95 112* 95  BUN '17 19 19  '$ CREATININE 0.96 1.11* 1.14*  CALCIUM 8.5* 8.6* 8.8*  MG 1.7 2.3  --    Liver Function Tests No  results for input(s): "AST", "ALT", "ALKPHOS", "BILITOT", "PROT", "ALBUMIN" in the last 72 hours.  No results for input(s): "LIPASE", "AMYLASE" in the last 72 hours.  Cardiac Enzymes No results for input(s): "CKTOTAL", "CKMB", "CKMBINDEX", "TROPONINI" in the last 72 hours.  BNP: BNP (last 3 results) Recent Labs    06/05/21 0654 07/02/21 2329  BNP 81.9 699.7*    ProBNP (last 3 results) No results for input(s): "PROBNP" in the last 8760 hours.   D-Dimer No results for input(s): "DDIMER" in the last 72 hours. Hemoglobin A1C No results for input(s): "HGBA1C" in the last 72 hours. Fasting Lipid Panel No results for input(s): "CHOL", "HDL", "LDLCALC", "TRIG", "CHOLHDL", "LDLDIRECT" in the last 72 hours.  Thyroid Function Tests No results for input(s): "TSH", "T4TOTAL", "T3FREE", "THYROIDAB" in the last 72 hours.  Invalid input(s): "FREET3"   Other results:   Imaging    No results found.   Medications:     Scheduled Medications:  amiodarone  400 mg Oral BID   apixaban  5 mg Oral BID   dapagliflozin propanediol  10 mg Oral Daily   furosemide  20 mg Oral Daily   methimazole  5 mg Oral TID   pantoprazole  40 mg Oral Daily  potassium chloride  20 mEq Oral Daily   prednisoLONE acetate  1 drop Left Eye QID   sodium chloride flush  3 mL Intravenous Q12H   spironolactone  12.5 mg Oral Daily    Infusions:  sodium chloride      PRN Medications: sodium chloride, acetaminophen, albuterol, artificial tears, fluticasone, ondansetron (ZOFRAN) IV, sodium chloride flush   Assessment/Plan   1. Acute on chronic diastolic CHF: TEE on 06/22/05 showed normal LV size with EF 60-65%, mild RV dysfunction, severe LAE with moderate-severe central MR (likely atrial functional MR). Suspect CHF exacerbation was triggered by atrial fibrillation, also mitral regurgitation may have been worsened by atrial fibrillation.  She had a recent prior admission with CHF in the setting of atrial  fibrillation.  Presentation was consistent with flash pulmonary edema.  Volume status looks ok today.  - Continue Lasix 20 mg daily and add KCl 20 daily.   - Continue Farxiga 10 mg daily.  - Continue spironolactone 12.5 daily.  - Need to keep her in NSR, decompensates in AF.  - Ischemia as a contributor to flash pulmonary edema is possible though less likely, troponin negative at admission.  Would consider LHC/RHC in the future, not urgent.  2. Atrial fibrillation: Persistent.  She was on Tikosyn in the past then had AF ablation in 2/21, Tikosyn stopped after this.  She was not on an antiarrhythmic prior to admission.  Has had increased frequency of AF recently, was admitted in 5/23 with AF and CHF exacerbation. She has also been noted to have developed hyperthyroidism prior to this admission, this can help trigger AF. Need to keep in NSR due to worsening of HF in AF.  DCCV 6/29 failed, but she was still on low dose norepinephrine. DCCV 7/3 successful, she is in NSR this morning.  - Continue amiodarone 400 mg bid x 1 week then 200 mg bid x 1 week then 200 mg daily.   - Continue Eliquis.  - Amiodarone is not an ideal medication for her (she already has hyperthyroidism).  Would like to avoid this long-term.  Will use for the time being to keep her in NSR, she will need to see EP soon as outpatient for a redo AF ablation allowing her to safely stop amiodarone.  3. Shock: Patient developed shock after intubation requiring vasopressin and NE.  This may have been related to sedation.  PCT 17.17 with WBCs initially 19, but WBCs now normal and patient is afebrile.  No localizing signs for infection.  Antibiotics were not started. Do not think she had septic shock.  She is now off pressors since extubation with stable BP.  4. AKI: In setting of shock, creatinine up to 2.93.  Baseline around 1.  Creatinine has been back to baseline.   5. Elevated LFTs: Suspect shock liver rather than related to amiodarone.  Now  trending down.  6.  Hyperthyroidism: Patient had this prior to admission (prior to amiodarone use).  Has multinodular goiter.  Low TSH with elevated free T4 but free T3 low this admission.   - Continue methimazole 5 mg tid.  - As above, amiodarone is not ideal but think necessary for the time being.  7. Disposition: OK for home from my standpoint.  Will arrange followup with EP and in CHF clinic.  Meds for discharge: Lasix 20 mg daily, spironolactone 12.5 daily, Farxiga 10 mg daily, amiodarone 400 mg bid x 1 week then 200 mg bid x 1 week then 200 mg daily, Eliquis  5 mg bid, methimazole 5 tid, KCl 20 daily.   Length of Stay: Holly, MD  07/11/2021, 8:47 AM  Advanced Heart Failure Team Pager [phone] (M-F; 7a - 5p)  Please contact [facility] for night-coverage after hours (5p -7a ) and weekends on [email or web address]

## 2021-07-11 NOTE — Progress Notes (Signed)
Physical Therapy Treatment Patient Details Name: Anna Marquez MRN: [id number] DOB: 26-Feb-1950 Today's Date: 07/11/2021   History of Present Illness Pt adm 6/27 for acute on chronic diastolic heart failure, pulmonary edema, AKI,  and persistant afib.  Intubated 6/27-6/29. PMH - htn, arthritis, afib, chf    PT Comments    Pt received OOB in recliner on arrival and agreeable to session with continued progress. Pt independent with safe transfers in room throughout session and supervision throughout ambulation without AD for increased distance. Pt demonstrating some anxiety about exertion and effect on sinus rhythm, educated pt on pacing, energy conservation and activity recommendations with pt verbalizing understanding. HR max throughout ambulation 88bpm, 80bpm at rest, continued discussion re; monitoring HR and O2 sats at home with pt verbalizing importance of compliance. Pt continues to benefit from skilled PT services to progress toward functional mobility goals.    Recommendations for follow up therapy are one component of a multi-disciplinary discharge planning process, led by the attending physician.  Recommendations may be updated based on patient status, additional functional criteria and insurance authorization.  Follow Up Recommendations  No PT follow up     Assistance Recommended at Discharge Intermittent Supervision/Assistance  Patient can return home with the following A little help with bathing/dressing/bathroom;Assistance with cooking/housework   Equipment Recommendations  None recommended by PT    Recommendations for Other Services       Precautions / Restrictions Precautions Precautions: Fall Restrictions Weight Bearing Restrictions: No     Mobility  Bed Mobility Overal bed mobility: Modified Independent             General bed mobility comments: pt OOB in recliner on arrival    Transfers Overall transfer level: Independent Equipment used:  None Transfers: Sit to/from Stand Sit to Stand: Independent           General transfer comment: performing transfers safely within room today    Ambulation/Gait Ambulation/Gait assistance: Supervision Gait Distance (Feet): 450 Feet Assistive device: None Gait Pattern/deviations: Step-through pattern, Decreased step length - right, Decreased step length - left, Decreased stride length Gait velocity: decreased     General Gait Details: ambultaion on RA, HR max 88bpm during activity, no complaints throughout   Stairs             Wheelchair Mobility    Modified Rankin (Stroke Patients Only)       Balance Overall balance assessment: Needs assistance Sitting-balance support: No upper extremity supported, Feet supported Sitting balance-Leahy Scale: Good     Standing balance support: No upper extremity supported Standing balance-Leahy Scale: Fair                              Cognition Arousal/Alertness: Awake/alert Behavior During Therapy: WFL for tasks assessed/performed Overall Cognitive Status: Within Functional Limits for tasks assessed                                          Exercises      General Comments General comments (skin integrity, edema, etc.): continued discussion of monitoring HR and O2 sats at home, discussed activiies and return slowly, pacing, and monitoring activity in current hot weather      Pertinent Vitals/Pain Pain Assessment Pain Assessment: No/denies pain Pain Intervention(s): Monitored during session    Home Living  Prior Function            PT Goals (current goals can now be found in the care plan section) Acute Rehab PT Goals Patient Stated Goal: return home PT Goal Formulation: With patient Time For Goal Achievement: 07/20/21    Frequency    Min 3X/week      PT Plan      Co-evaluation              AM-PAC PT "6 Clicks" Mobility    Outcome Measure  Help needed turning from your back to your side while in a flat bed without using bedrails?: None Help needed moving from lying on your back to sitting on the side of a flat bed without using bedrails?: None Help needed moving to and from a bed to a chair (including a wheelchair)?: None Help needed standing up from a chair using your arms (e.g., wheelchair or bedside chair)?: None Help needed to walk in hospital room?: None Help needed climbing 3-5 steps with a railing? : A Lot 6 Click Score: 22    End of Session   Activity Tolerance: Patient tolerated treatment well Patient left: with call bell/phone within reach;in chair Nurse Communication: Mobility status PT Visit Diagnosis: Other abnormalities of gait and mobility (R26.89);Muscle weakness (generalized) (M62.81)     Time: 2536-6440 PT Time Calculation (min) (ACUTE ONLY): 14 min  Charges:  $Therapeutic Activity: 8-22 mins                     Elvyn Krohn R. PTA Acute Rehabilitation Services Office: [place] 07/11/2021, 10:13 AM

## 2021-07-12 ENCOUNTER — Encounter: Payer: Self-pay | Admitting: Cardiology

## 2021-07-12 ENCOUNTER — Telehealth: Payer: Self-pay | Admitting: Student

## 2021-07-12 NOTE — Telephone Encounter (Signed)
Telephone encounter:  Patient paged that she was back in afib. She was recently hospitalized for uncontrolled atrial fibrillation and acute hyperthyroidism. Her hospital course was complicated by acute respiratory failure requiring mechanical ventilation. She received CV x2 (6/29 with TEE, 7/3) and was placed on oral amiodarone, apixaban for [facility] and f/u outpatient with EP. On discharge, she was in NSR.  Patient reports she went back into afib at 11:53 pm this evening. Her pulse ox shows a HR between 90 -130 and O2 sat 94%. She does not endorse presyncope or syncope, but does report some SOB. Home BP 169/114, pulse 116. She is currently on PO amiodarone and apixaban. She has follow up with cardiology on 07/24/21, 07/31/21 (Dr. Curt Bears) and family medicine on 09/04/21.   Given her lack of symptoms, hemodynamic stability, and close cardiology follow up, I reassured her that there is currently no indication to come into ER. However, I advised her that if she develops hypotension (SBP<90), chest pain, presyncope or syncope, to come to the ER. She verbalized understanding.   Loel Dubonnet MD MPH [facility]

## 2021-07-16 ENCOUNTER — Encounter (HOSPITAL_COMMUNITY): Payer: Self-pay | Admitting: Physician Assistant

## 2021-07-16 ENCOUNTER — Ambulatory Visit (HOSPITAL_COMMUNITY)
Admission: RE | Admit: 2021-07-16 | Discharge: 2021-07-16 | Disposition: A | Discharge status: Home / Self Care | Payer: PPO | Source: Ambulatory Visit | Attending: Physician Assistant | Admitting: Physician Assistant

## 2021-07-16 VITALS — BP 132/80 | HR 123 | Ht 64.0 in | Wt 161.2 lb

## 2021-07-16 DIAGNOSIS — I4819 Other persistent atrial fibrillation: Secondary | ICD-10-CM | POA: Insufficient documentation

## 2021-07-16 DIAGNOSIS — I5032 Chronic diastolic (congestive) heart failure: Secondary | ICD-10-CM | POA: Diagnosis not present

## 2021-07-16 DIAGNOSIS — I4892 Unspecified atrial flutter: Secondary | ICD-10-CM | POA: Insufficient documentation

## 2021-07-16 DIAGNOSIS — D6869 Other thrombophilia: Secondary | ICD-10-CM

## 2021-07-16 DIAGNOSIS — Z7901 Long term (current) use of anticoagulants: Secondary | ICD-10-CM | POA: Diagnosis not present

## 2021-07-16 DIAGNOSIS — I341 Nonrheumatic mitral (valve) prolapse: Secondary | ICD-10-CM | POA: Insufficient documentation

## 2021-07-16 DIAGNOSIS — I110 Hypertensive heart disease with heart failure: Secondary | ICD-10-CM | POA: Diagnosis not present

## 2021-07-16 DIAGNOSIS — I4891 Unspecified atrial fibrillation: Secondary | ICD-10-CM | POA: Diagnosis not present

## 2021-07-16 MED ORDER — METOPROLOL SUCCINATE ER 25 MG PO TB24: 25.0000 mg | ORAL_TABLET | Freq: Every day | ORAL | 6 refills | Status: DC

## 2021-07-16 NOTE — Patient Instructions (Signed)
Start Toprol 25 mg daily

## 2021-07-16 NOTE — Progress Notes (Signed)
Primary Care Physician: [name], DO Referring Physician: ER f/u Cardiologist: Dr. Acie Fredrickson  Primary EP: Dr Rayann Heman   Anna Marquez is a 71 y.o. female with a h/o paroxysmal afib first dx March 2018. She was originally  seen  in the afib office for having afib with RVR  in the ER,  and receiving successful cardioversion 11/05/18. Unfortunately, she was back in rapid afib at 141 bpm.   She had an echo and a ETT in 2018. EKG in rhythm in 2018 showed SR with a first degree av block.  In the past she would take an extra corgard to get back in rhythm. No outstanding triggers.   Return to afib clinic 12/16/18.  Repeat Echo showed reduced EF at 45-50% and Zio patch showed continuous afib with  HR control  at 96 bpm, max at 218 ms, minimum at 54 bpm.  Discussed antiarrythmic's with pt. With the reduced EF and evidence of first degree AV block on a prior EKG in rhythm, it would be by guidelines to avoid Multaq  and flecainide. Discussed with pt use of  tikosyn and she wants to pursue.   Back in afib, clinic for Tikosyn admit, 12/21/18. She states that her afib has been running over 100 bpm for the last   several days. It is making her more fatigued.  In the clinic v rates in the 120's. She  is getting more symptomatic with it. Her weight is stable.   F/u in afib clinic, 01/04/19.  She is in afib this am but has felt so much better and was surprised  that she was out of rhythm. She went in and out while in the hospital  but did break with Tikosyn. Did not require DCCV. The dose had to be reduced to 250 mcg  because the qtc did prolong. Continues  on eliquis with a CHA2DS2VASc score of 3.  F/u in afib clinic, 01/11/19. EKG shows afib. Her records at home indicate that her HR's have been elevated so I suspect that she has been in afib since I last saw her last week. The shortness of breath improved after hospitalization for Tikosyn but now  has  returned.  Review os her TSH over time shows low TSH readings  but T4 when rechecked in November  was in normal range.   F/u in afib clinic, 01/22/19. Had successful cardioversion but yesterday went back out of rhythm. She is feeling better right now than right before DCCV.EKG shows atrial flutter at 107 bpm. She  is interested in having afib ablation. She has an appointment with Dr. Rayann Heman to further discuss on 1/28.Contiues on Tikosyn and eliquis with CHA2DS2VASc score of 3.   F/u in afib clinic, 03/11/19. She is now one month qfib ablation.. She has been staying in SR on dofetilide. She had been doing great unitl she got her covid shot one week ago. Since this she has had elevated BP and H/A. No afib . No swallowing or groin issues.   Follow up in the AF clinic 07/16/21. She was admitted in 5/23 with acute respiratory failure in setting of atrial fibrillation and CHF, there was some question of PNA.  She spontaneously converted to NSR.  At office visit subsequently on 06/19/21, she remained in NSR. She was admitted again on 6/27 with dyspnea.  She reported 2 days of dyspnea prior to admission.  In ER, she was in atrial fibrillation with RVR.  She developed progressive respiratory distress.  Amiodarone gtt  was started and she was given Lasix.  She was ultimately intubated. DCCV was attempted x 2, both times she went back into NSR but promptly reverted to AF. She continued to load on amiodarone and had DCCV again on 07/09/21 which was successful. She reports that she went back into afib on 7/5, then back into SR on 7/7, then afib on 7/8. Appears to be paroxysmal at this point. Overall, she does feel better with no symptoms of fluid retention. She is fatigued.   Today, she denies symptoms of palpitations, chest pain, shortness of breath, orthopnea, PND, lower extremity edema, dizziness, presyncope, syncope, or neurologic sequela. The patient is tolerating medications without difficulties and is otherwise without complaint today.   Past Medical History:  Diagnosis Date    Adenomatous colon polyp 2003   Cholelithiasis    COVID    Diverticulosis of colon    Gastric polyps    GERD (gastroesophageal reflux disease)    Helicobacter pylori (H. pylori)    Hiatal hernia    Hypertension    Mitral valve prolapse    Nephrolithiasis    Osteoarthritis    Osteopenia 03/2017   T score -1.5 FRAX 8.9% / 0.9%.  Stable from prior DEXA 2016   Persistent atrial fibrillation (HCC)    TGA (transient global amnesia)    Past Surgical History:  Procedure Laterality Date   ANTERIOR AND POSTERIOR REPAIR N/A 06/14/2014   Procedure: ANTERIOR (CYSTOCELE) AND POSTERIOR REPAIR (RECTOCELE);  Surgeon: Anastasio Auerbach, MD;  Location: [facility];  Service: Gynecology;  Laterality: N/A;   ATRIAL FIBRILLATION ABLATION N/A 02/11/2019   Procedure: ATRIAL FIBRILLATION ABLATION;  Surgeon: Thompson Grayer, MD;  Location: [facility];  Service: Cardiovascular;  Laterality: N/A;   CARDIOVERSION N/A 01/15/2019   Procedure: CARDIOVERSION;  Surgeon: Fay Records, MD;  Location: [facility];  Service: Cardiovascular;  Laterality: N/A;   CARDIOVERSION N/A 07/09/2021   Procedure: CARDIOVERSION;  Surgeon: Larey Dresser, MD;  Location: [facility];  Service: Cardiovascular;  Laterality: N/A;   CHOLECYSTECTOMY     kidney stone removal     x2   VAGINAL HYSTERECTOMY N/A 06/14/2014   Procedure: HYSTERECTOMY VAGINAL;  Surgeon: Anastasio Auerbach, MD;  Location: [facility];  Service: Gynecology;  Laterality: N/A;    Current Outpatient Medications  Medication Sig Dispense Refill   acetaminophen (TYLENOL) 500 MG tablet Take 1,000 mg by mouth every 6 (six) hours as needed for moderate pain or headache.     albuterol (PROVENTIL) (2.5 MG/3ML) 0.083% nebulizer solution Take 3 mLs (2.5 mg total) by nebulization every 4 (four) hours as needed for wheezing or shortness of breath (please include nebulizer machine, hoses, and mask if needed.). 30 mL 0   albuterol (VENTOLIN HFA) 108 (90 Base) MCG/ACT inhaler INHALE 1 TO  2 PUFFS INTO THE LUNGS EVERY 6 HOURS AS NEEDED FOR WHEEZING OR SHORTNESS OF BREATH. (Patient taking differently: Inhale 1-2 puffs into the lungs every 6 (six) hours as needed for wheezing or shortness of breath.) 8.5 g 3   amiodarone (PACERONE) 200 MG tablet Take 2 tablets twice daily until 07/12, then on 07/13 take 1 tablet twice daily until 07/19, then on 07/20 start taking on tablet daily. 60 tablet 0   Ascorbic Acid (VITAMIN C) 1000 MG tablet Take 1,000 mg by mouth daily.     chlorpheniramine-HYDROcodone (TUSSIONEX PENNKINETIC ER) 10-8 MG/5ML Take 5 mLs by mouth every 12 (twelve) hours as needed for cough. 115 mL 0   Cholecalciferol (  VITAMIN D) 50 MCG (2000 UT) tablet Take 2,000 Units by mouth daily.     dapagliflozin propanediol (FARXIGA) 10 MG TABS tablet Take 1 tablet (10 mg total) by mouth daily. 30 tablet 0   diclofenac Sodium (VOLTAREN) 1 % GEL Apply 2-4 g topically 4 (four) times daily as needed (knees,ankles and hand pain).     ELIQUIS 5 MG TABS tablet TAKE 1 TABLET BY MOUTH 2 TIMES DAILY. 60 tablet 5   ferrous sulfate 325 (65 FE) MG tablet Take 325 mg by mouth every other day.     fluticasone (FLONASE) 50 MCG/ACT nasal spray PLACE 1 SPRAY INTO BOTH NOSTRILS AS NEEDED FOR ALLERGIES OR RHINITIS. 16 g 6   furosemide (LASIX) 20 MG tablet Take 1 tablet (20 mg total) by mouth daily. 30 tablet 0   loratadine (CLARITIN) 10 MG tablet Take 10 mg by mouth daily.      methimazole (TAPAZOLE) 5 MG tablet Take 1 tablet (5 mg total) by mouth 3 (three) times daily. 30 tablet 3   metoprolol succinate (TOPROL XL) 25 MG 24 hr tablet Take 1 tablet (25 mg total) by mouth daily. 30 tablet 6   pantoprazole (PROTONIX) 40 MG tablet TAKE 1 TABLET (40 MG TOTAL) BY MOUTH DAILY. 90 tablet 4   potassium chloride SA (KLOR-CON M) 20 MEQ tablet Take 1 tablet (20 mEq total) by mouth daily. 30 tablet 0   prednisoLONE acetate (PRED FORTE) 1 % ophthalmic suspension Place 1 drop into the left eye 4 (four) times daily.      Probiotic Product (PROBIOTIC PO) Take 1 capsule by mouth daily.     spironolactone (ALDACTONE) 25 MG tablet Take 1/2 tablet (12.5 mg total) by mouth daily. 15 tablet 0   vitamin E 180 MG (400 UNITS) capsule Take 400 Units by mouth daily.     ZINC CITRATE PO Take 22 mg by mouth daily.     No current facility-administered medications for this encounter.    Allergies  Allergen Reactions   Erythromycin     Upset stomach   Nsaids Other (See Comments)    Has a history of bleeding ulcers, tolerates aspirin    Penicillins Hives and Shortness Of Breath    Did it involve swelling of the face/tongue/throat, SOB, or low BP? Yes Did it involve sudden or severe rash/hives, skin peeling, or any reaction on the inside of your mouth or nose? No Did you need to seek medical attention at a hospital or doctor's office? No When did it last happen?      45 years If all above answers are "NO", may proceed with cephalosporin use.     Social History   Socioeconomic History   Marital status: Divorced    Spouse name: Not on file   Number of children: 2   Years of education: 12th grade   Highest education level: High school graduate  Occupational History   Occupation: Retired,  Tobacco Use   Smoking status: Never   Smokeless tobacco: Never   Tobacco comments:    Never smoke 07/16/21  Vaping Use   Vaping Use: Never used  Substance and Sexual Activity   Alcohol use: Never    Alcohol/week: 0.0 standard drinks of alcohol   Drug use: No   Sexual activity: Yes    Birth control/protection: Post-menopausal    Comment: 1st intercourse 71 yo-Fewer than 5 partners  Other Topics Concern   Not on file  Social History Narrative   Lives along with her  dog. She enjoys croteching, crafts, making wreaths, reading and gardening in her free time.    Social Determinants of Health   Financial Resource Strain: Low Risk  (03/26/2021)   Overall Financial Resource Strain (CARDIA)    Difficulty of Paying Living  Expenses: Not hard at all  Food Insecurity: No Food Insecurity (03/26/2021)   Hunger Vital Sign    Worried About Running Out of Food in the Last Year: Never true    Ran Out of Food in the Last Year: Never true  Transportation Needs: No Transportation Needs (03/26/2021)   PRAPARE - Hydrologist (Medical): No    Lack of Transportation (Non-Medical): No  Physical Activity: Sufficiently Active (03/26/2021)   Exercise Vital Sign    Days of Exercise per Week: 3 days    Minutes of Exercise per Session: 120 min  Stress: No Stress Concern Present (03/26/2021)   [place]    Feeling of Stress : Not at all  Social Connections: Moderately Isolated (03/26/2021)   Social Connection and Isolation Panel [NHANES]    Frequency of Communication with Friends and Family: More than three times a week    Frequency of Social Gatherings with Friends and Family: More than three times a week    Attends Religious Services: More than 4 times per year    Active Member of Clubs or Organizations: No    Attends Archivist Meetings: Never    Marital Status: Divorced  Human resources officer Violence: Not At Risk (03/26/2021)   Humiliation, Afraid, Rape, and Kick questionnaire    Fear of Current or Ex-Partner: No    Emotionally Abused: No    Physically Abused: No    Sexually Abused: No    Family History  Problem Relation Age of Onset   Cancer Father        prostate   Breast cancer Paternal Aunt 3   Diabetes Maternal Grandmother    Heart disease Maternal Grandmother    Heart disease Maternal Grandfather    Melanoma Paternal Aunt    Neuropathy Mother    Congestive Heart Failure Mother    Thyroid disease Daughter     ROS- All systems are reviewed and negative except as per the HPI above  Physical Exam: Vitals:   07/16/21 1507  BP: 132/80  Pulse: (!) 123  Weight: 73.1 kg  Height: '5\' 4"'$  (1.626 m)    Wt  Readings from Last 3 Encounters:  07/16/21 73.1 kg  07/11/21 76.3 kg  07/01/21 77.1 kg    Labs: Lab Results  Component Value Date   NA 138 07/11/2021   K 4.1 07/11/2021   CL 106 07/11/2021   CO2 21 (L) 07/11/2021   GLUCOSE 95 07/11/2021   BUN 19 07/11/2021   CREATININE 1.14 (H) 07/11/2021   CALCIUM 8.8 (L) 07/11/2021   PHOS 3.1 07/05/2021   MG 2.3 07/10/2021   Lab Results  Component Value Date   INR 1.1 12/25/2018   Lab Results  Component Value Date   CHOL 179 08/07/2020   HDL 46 (L) 08/07/2020   LDLCALC 80 08/07/2020   TRIG 503 (H) 07/05/2021     GEN- The patient is a well appearing female, alert and oriented x 3 today.   HEENT-head normocephalic, atraumatic, sclera clear, conjunctiva pink, hearing intact, trachea midline. Lungs- Clear to ausculation bilaterally, normal work of breathing Heart- irregular rate and rhythm, no murmurs, rubs or gallops  GI- soft,  NT, ND, + BS Extremities- no clubbing, cyanosis, or edema MS- no significant deformity or atrophy Skin- no rash or lesion Psych- euthymic mood, full affect Neuro- strength and sensation are intact   EKG-  Afib RVR Vent. rate 123 BPM PR interval * ms QRS duration 98 ms QT/QTcB 320/458 ms  Echo 07/04/21  1. Left ventricular ejection fraction, by estimation, is 50 to 55%. The  left ventricle has low normal function. The left ventricle has no regional  wall motion abnormalities.   2. Right ventricular systolic function is moderately reduced. The right  ventricular size is mildly enlarged. There is normal pulmonary artery  systolic pressure. The estimated right ventricular systolic pressure is  99.3 mmHg.   3. A small pericardial effusion is present. The pericardial effusion is  surrounding the apex and localized near the right ventricle. There is no  evidence of cardiac tamponade.   4. PISA 0.2 cm. The mitral valve is normal in structure. Mild to moderate mitral valve regurgitation. No evidence of  mitral stenosis.   5. Tricuspid valve regurgitation is mild to moderate.   6. The aortic valve is normal in structure. Aortic valve regurgitation is  not visualized. No aortic stenosis is present.   7. The inferior vena cava is dilated in size with >50% respiratory  variability, suggesting right atrial pressure of 8 mmHg.   Comparison(s): Prior images reviewed side by side. RV size in increased and function reduced when compared to 01/25/2021.     Assessment and Plan: 1. Persistent atrial fibrillation/atrial flutter S/p ablation 02/11/19  Patient back in rapid atrial fibrillation, appears paroxysmal at this point, will not schedule DCCV. Hopefully, she will stay in SR longer and longer as she loads on amiodarone. She has an appointment with PCP Wednesday to recheck thyroid function.  Continue amiodarone 400 mg bid x 1 week post discharge then 200 mg bid x 1 week then 200 mg daily. Will start Toprol 25 mg daily Patient has an appointment with Dr Curt Bears to discuss ablation.  Continue Eliquis 5 mg BID  2. CHA2DS2VASc score of at least 3 Continue eliquis 5 mg bid   3. MVP Mild-mod MR  4. Chronic diastolic CHF Appears euvolemic today. Patient reports she is down 14 lbs.  Followed in [facility]  5. HTN Stable, add BB as above.    Follow up with [facility] and with Dr Curt Bears as scheduled.    [facility] [address] [place], [place] [address] [phone]

## 2021-07-18 ENCOUNTER — Ambulatory Visit (INDEPENDENT_AMBULATORY_CARE_PROVIDER_SITE_OTHER): Payer: PPO | Admitting: Family Medicine

## 2021-07-18 ENCOUNTER — Encounter: Payer: Self-pay | Admitting: Family Medicine

## 2021-07-18 VITALS — BP 121/78 | HR 105 | Ht 64.0 in | Wt 162.0 lb

## 2021-07-18 DIAGNOSIS — I5033 Acute on chronic diastolic (congestive) heart failure: Secondary | ICD-10-CM

## 2021-07-18 DIAGNOSIS — I4819 Other persistent atrial fibrillation: Secondary | ICD-10-CM | POA: Diagnosis not present

## 2021-07-18 DIAGNOSIS — E0590 Thyrotoxicosis, unspecified without thyrotoxic crisis or storm: Secondary | ICD-10-CM

## 2021-07-18 DIAGNOSIS — I4891 Unspecified atrial fibrillation: Secondary | ICD-10-CM

## 2021-07-18 DIAGNOSIS — I10 Essential (primary) hypertension: Secondary | ICD-10-CM | POA: Diagnosis not present

## 2021-07-18 NOTE — Progress Notes (Signed)
Anna Marquez - 71 y.o. female MRN [id number]  Date of birth: 1950-02-25  Subjective Chief Complaint  Patient presents with   Hospitalization Follow-up    HPI Anna Marquez is a 71 year old female here today for hospital follow-up.  Recently discharged after returning to A-fib with RVR acute CHF exacerbation.  She did require intubation due to respiratory distress.  Given IV furosemide for diuresis and IV amiodarone added for rate control.  She did undergo TEE cardioversion however returned to atrial fibrillation shortly afterwards.  Once again she was DC cardioverted a couple of days later and converted to sinus rhythm.  She was transitioned to oral amiodarone.  She did well with diuresis and was extubated.  She was discharged in normal sinus rhythm however converted back to atrial fibrillation a few days later.  She has remained in atrial fibrillation since that time.    Additional heart failure management with the Farxiga and Aldactone.  She has followed up with atrial fibrillation clinic.  She is tapering back some on amiodarone.  Toprol was added.  She has an appoint with Dr. Curt Bears to discuss another ablation.  Additionally she was scheduled with the advanced heart failure clinic.  Her atrial fibrillation has been complicated by hyperthyroidism.  She is currently on methimazole to help with management of this.  She is compliant with this.  ROS:  A comprehensive ROS was completed and negative except as noted per HPI   Allergies  Allergen Reactions   Erythromycin     Upset stomach   Nsaids Other (See Comments)    Has a history of bleeding ulcers, tolerates aspirin    Penicillins Hives and Shortness Of Breath    Did it involve swelling of the face/tongue/throat, SOB, or low BP? Yes Did it involve sudden or severe rash/hives, skin peeling, or any reaction on the inside of your mouth or nose? No Did you need to seek medical attention at a hospital or doctor's office? No When did it  last happen?      45 years If all above answers are "NO", may proceed with cephalosporin use.     Past Medical History:  Diagnosis Date   Adenomatous colon polyp 2003   Cholelithiasis    COVID    Diverticulosis of colon    Gastric polyps    GERD (gastroesophageal reflux disease)    Helicobacter pylori (H. pylori)    Hiatal hernia    Hypertension    Mitral valve prolapse    Nephrolithiasis    Osteoarthritis    Osteopenia 03/2017   T score -1.5 FRAX 8.9% / 0.9%.  Stable from prior DEXA 2016   Persistent atrial fibrillation (HCC)    TGA (transient global amnesia)     Past Surgical History:  Procedure Laterality Date   ANTERIOR AND POSTERIOR REPAIR N/A 06/14/2014   Procedure: ANTERIOR (CYSTOCELE) AND POSTERIOR REPAIR (RECTOCELE);  Surgeon: Anastasio Auerbach, MD;  Location: [facility];  Service: Gynecology;  Laterality: N/A;   ATRIAL FIBRILLATION ABLATION N/A 02/11/2019   Procedure: ATRIAL FIBRILLATION ABLATION;  Surgeon: Thompson Grayer, MD;  Location: [place];  Service: Cardiovascular;  Laterality: N/A;   CARDIOVERSION N/A 01/15/2019   Procedure: CARDIOVERSION;  Surgeon: Fay Records, MD;  Location: [facility] ENDOSCOPY;  Service: Cardiovascular;  Laterality: N/A;   CARDIOVERSION N/A 07/09/2021   Procedure: CARDIOVERSION;  Surgeon: Larey Dresser, MD;  Location: [facility] ENDOSCOPY;  Service: Cardiovascular;  Laterality: N/A;   CHOLECYSTECTOMY     kidney stone  removal     x2   VAGINAL HYSTERECTOMY N/A 06/14/2014   Procedure: HYSTERECTOMY VAGINAL;  Surgeon: Anastasio Auerbach, MD;  Location: [facility];  Service: Gynecology;  Laterality: N/A;    Social History   Socioeconomic History   Marital status: Divorced    Spouse name: Not on file   Number of children: 2   Years of education: 12th grade   Highest education level: High school graduate  Occupational History   Occupation: Retired,  Tobacco Use   Smoking status: Never   Smokeless tobacco: Never   Tobacco comments:    Never smoke  07/16/21  Vaping Use   Vaping Use: Never used  Substance and Sexual Activity   Alcohol use: Never    Alcohol/week: 0.0 standard drinks of alcohol   Drug use: No   Sexual activity: Yes    Birth control/protection: Post-menopausal    Comment: 1st intercourse 71 yo-Fewer than 5 partners  Other Topics Concern   Not on file  Social History Narrative   Lives along with her dog. She enjoys croteching, crafts, making wreaths, reading and gardening in her free time.    Social Determinants of Health   Financial Resource Strain: Low Risk  (03/26/2021)   Overall Financial Resource Strain (CARDIA)    Difficulty of Paying Living Expenses: Not hard at all  Food Insecurity: No Food Insecurity (03/26/2021)   Hunger Vital Sign    Worried About Running Out of Food in the Last Year: Never true    Ran Out of Food in the Last Year: Never true  Transportation Needs: No Transportation Needs (03/26/2021)   PRAPARE - Hydrologist (Medical): No    Lack of Transportation (Non-Medical): No  Physical Activity: Sufficiently Active (03/26/2021)   Exercise Vital Sign    Days of Exercise per Week: 3 days    Minutes of Exercise per Session: 120 min  Stress: No Stress Concern Present (03/26/2021)   [place]    Feeling of Stress : Not at all  Social Connections: Moderately Isolated (03/26/2021)   Social Connection and Isolation Panel [NHANES]    Frequency of Communication with Friends and Family: More than three times a week    Frequency of Social Gatherings with Friends and Family: More than three times a week    Attends Religious Services: More than 4 times per year    Active Member of [employer] or Organizations: No    Attends Archivist Meetings: Never    Marital Status: Divorced    Family History  Problem Relation Age of Onset   Cancer Father        prostate   Breast cancer Paternal Aunt 72   Diabetes  Maternal Grandmother    Heart disease Maternal Grandmother    Heart disease Maternal Grandfather    Melanoma Paternal Aunt    Neuropathy Mother    Congestive Heart Failure Mother    Thyroid disease Daughter     Health Maintenance  Topic Date Due   COVID-19 Vaccine (3 - Pfizer risk series) 09/13/2021 (Originally 04/21/2019)   Zoster Vaccines- Shingrix (1 of 2) 10/18/2021 (Originally 02/02/1969)   INFLUENZA VACCINE  08/07/2021   MAMMOGRAM  07/27/2022   COLONOSCOPY (Pts 45-54yr Insurance coverage will need to be confirmed)  03/13/2023   TETANUS/TDAP  10/29/2027   Pneumonia Vaccine 71 Years old  Completed   DEXA SCAN  Completed   Hepatitis C Screening  Completed  HPV VACCINES  Aged Out     ----------------------------------------------------------------------------------------------------------------------------------------------------------------------------------------------------------------- Physical Exam BP 121/78 (BP Location: Left Arm, Patient Position: Sitting, Cuff Size: Normal)   Pulse (!) 105   Ht '5\' 4"'$  (1.626 m)   Wt 162 lb (73.5 kg)   SpO2 97%   BMI 27.81 kg/m   Physical Exam Constitutional:      Appearance: Normal appearance.  Eyes:     General: No scleral icterus. Cardiovascular:     Rate and Rhythm: Rhythm irregular.  Pulmonary:     Effort: Pulmonary effort is normal.     Breath sounds: Normal breath sounds.  Musculoskeletal:     Cervical back: Neck supple.  Neurological:     Mental Status: She is alert.  Psychiatric:        Mood and Affect: Mood normal.        Behavior: Behavior normal.     ------------------------------------------------------------------------------------------------------------------------------------------------------------------------------------------------------------------- Assessment and Plan  Essential hypertension Blood pressure stable at this time.    Persistent atrial fibrillation ([place]) She is remained in normal  sinus rhythm for a few days after last cardioversion.  Currently she is back in A-fib.  She will continue current medications for rate control.  Additionally continue Eliquis for anticoagulation.  She does have an upcoming visit with Dr. Curt Bears to discuss a repeat ablation.  Acute on chronic diastolic CHF (congestive heart failure) (HCC) Continue Farxiga, furosemide and Aldactone.  She will continue to see advanced heart failure clinic.  Hyperthyroidism We will keep a close eye on her thyroid given that she is on amiodarone at this time.  We will recheck TSH, T3 and T4 today.  Continue methimazole.   No orders of the defined types were placed in this encounter.   Return in about 6 weeks (around 08/29/2021) for F/u thyroid.    This visit occurred during the SARS-CoV-2 public health emergency.  Safety protocols were in place, including screening questions prior to the visit, additional usage of staff PPE, and extensive cleaning of exam room while observing appropriate contact time as indicated for disinfecting solutions.  Continue continue home

## 2021-07-18 NOTE — Assessment & Plan Note (Addendum)
We will keep a close eye on her thyroid given that she is on amiodarone at this time.  We will recheck TSH, T3 and T4 today.  Continue methimazole.

## 2021-07-18 NOTE — Assessment & Plan Note (Signed)
She is remained in normal sinus rhythm for a few days after last cardioversion.  Currently she is back in A-fib.  She will continue current medications for rate control.  Additionally continue Eliquis for anticoagulation.  She does have an upcoming visit with Dr. Curt Bears to discuss a repeat ablation.

## 2021-07-18 NOTE — Assessment & Plan Note (Signed)
Continue Farxiga, furosemide and Aldactone.  She will continue to see advanced heart failure clinic.

## 2021-07-18 NOTE — Assessment & Plan Note (Signed)
Blood pressure stable at this time. °

## 2021-07-19 LAB — T3: T3, Total: 76 ng/dL (ref 76–181)

## 2021-07-19 LAB — T4, FREE: Free T4: 1.6 ng/dL (ref 0.8–1.8)

## 2021-07-19 LAB — COMPLETE METABOLIC PANEL WITH GFR
AG Ratio: 1.4 (calc) (ref 1.0–2.5)
ALT: 22 U/L (ref 6–29)
AST: 14 U/L (ref 10–35)
Albumin: 4.3 g/dL (ref 3.6–5.1)
Alkaline phosphatase (APISO): 86 U/L (ref 37–153)
BUN/Creatinine Ratio: 18 (calc) (ref 6–22)
BUN: 27 mg/dL — ABNORMAL HIGH (ref 7–25)
CO2: 18 mmol/L — ABNORMAL LOW (ref 20–32)
Calcium: 10.2 mg/dL (ref 8.6–10.4)
Chloride: 105 mmol/L (ref 98–110)
Creat: 1.47 mg/dL — ABNORMAL HIGH (ref 0.60–1.00)
Globulin: 3.1 g/dL (calc) (ref 1.9–3.7)
Glucose, Bld: 114 mg/dL — ABNORMAL HIGH (ref 65–99)
Potassium: 4.8 mmol/L (ref 3.5–5.3)
Sodium: 136 mmol/L (ref 135–146)
Total Bilirubin: 0.8 mg/dL (ref 0.2–1.2)
Total Protein: 7.4 g/dL (ref 6.1–8.1)
eGFR: 38 mL/min/{1.73_m2} — ABNORMAL LOW (ref >=60)

## 2021-07-19 LAB — TSH: TSH: 3.77 mIU/L (ref 0.40–4.50)

## 2021-07-20 ENCOUNTER — Other Ambulatory Visit: Payer: Self-pay | Admitting: Family Medicine

## 2021-07-20 DIAGNOSIS — I10 Essential (primary) hypertension: Secondary | ICD-10-CM

## 2021-07-24 ENCOUNTER — Ambulatory Visit (HOSPITAL_COMMUNITY)
Admit: 2021-07-24 | Discharge: 2021-07-24 | Disposition: A | Discharge status: Home / Self Care | Payer: PPO | Attending: Family Medicine | Admitting: Family Medicine

## 2021-07-24 ENCOUNTER — Encounter (HOSPITAL_COMMUNITY): Payer: Self-pay

## 2021-07-24 VITALS — BP 130/86 | HR 130 | Wt 162.2 lb

## 2021-07-24 DIAGNOSIS — I4819 Other persistent atrial fibrillation: Secondary | ICD-10-CM

## 2021-07-24 DIAGNOSIS — R0603 Acute respiratory distress: Secondary | ICD-10-CM | POA: Insufficient documentation

## 2021-07-24 DIAGNOSIS — E0520 Thyrotoxicosis with toxic multinodular goiter without thyrotoxic crisis or storm: Secondary | ICD-10-CM | POA: Diagnosis not present

## 2021-07-24 DIAGNOSIS — I5033 Acute on chronic diastolic (congestive) heart failure: Secondary | ICD-10-CM | POA: Insufficient documentation

## 2021-07-24 DIAGNOSIS — E0590 Thyrotoxicosis, unspecified without thyrotoxic crisis or storm: Secondary | ICD-10-CM | POA: Diagnosis not present

## 2021-07-24 DIAGNOSIS — I5032 Chronic diastolic (congestive) heart failure: Secondary | ICD-10-CM | POA: Diagnosis not present

## 2021-07-24 DIAGNOSIS — I110 Hypertensive heart disease with heart failure: Secondary | ICD-10-CM | POA: Diagnosis not present

## 2021-07-24 DIAGNOSIS — Z79899 Other long term (current) drug therapy: Secondary | ICD-10-CM | POA: Insufficient documentation

## 2021-07-24 DIAGNOSIS — Z7901 Long term (current) use of anticoagulants: Secondary | ICD-10-CM | POA: Insufficient documentation

## 2021-07-24 DIAGNOSIS — N179 Acute kidney failure, unspecified: Secondary | ICD-10-CM | POA: Diagnosis not present

## 2021-07-24 DIAGNOSIS — Z87898 Personal history of other specified conditions: Secondary | ICD-10-CM | POA: Diagnosis not present

## 2021-07-24 LAB — BASIC METABOLIC PANEL
Anion gap: 11 (ref 5–15)
BUN: 21 mg/dL (ref 8–23)
CO2: 21 mmol/L — ABNORMAL LOW (ref 22–32)
Calcium: 10 mg/dL (ref 8.9–10.3)
Chloride: 104 mmol/L (ref 98–111)
Creatinine, Ser: 1.46 mg/dL — ABNORMAL HIGH (ref 0.44–1.00)
GFR, Estimated: 38 mL/min — ABNORMAL LOW (ref >60)
Glucose, Bld: 104 mg/dL — ABNORMAL HIGH (ref 70–99)
Potassium: 5.1 mmol/L (ref 3.5–5.1)
Sodium: 136 mmol/L (ref 135–145)

## 2021-07-24 LAB — CBC
HCT: 38.6 % (ref 36.0–46.0)
Hemoglobin: 12.8 g/dL (ref 12.0–15.0)
MCH: 30.1 pg (ref 26.0–34.0)
MCHC: 33.2 g/dL (ref 30.0–36.0)
MCV: 90.8 fL (ref 80.0–100.0)
Platelets: 193 10*3/uL (ref 150–400)
RBC: 4.25 MIL/uL (ref 3.87–5.11)
RDW: 14.3 % (ref 11.5–15.5)
WBC: 6.1 10*3/uL (ref 4.0–10.5)
nRBC: 0 % (ref 0.0–0.2)

## 2021-07-24 LAB — BRAIN NATRIURETIC PEPTIDE: B Natriuretic Peptide: 30.3 pg/mL (ref 0.0–100.0)

## 2021-07-24 MED ORDER — SPIRONOLACTONE 25 MG PO TABS: 25.0000 mg | ORAL_TABLET | Freq: Every day | ORAL | 11 refills | Status: DC

## 2021-07-24 NOTE — Patient Instructions (Signed)
STOP Furosemide STOP Potassium  INCREASE Spironolactone 25 mg one tab daily   Labs today We will only contact you if something comes back abnormal or we need to make some changes. Otherwise no news is good news!  Labs needed in one week (will have done at Dr Curt Bears appointment)  Your physician recommends that you schedule a follow-up appointment in: 3-4 weeks with the pharmacy team   Your physician wants you to follow-up in: 3-4 months with Dr Aundra Dubin. You will receive a reminder letter in the mail two months in advance. If you don't receive a letter, please call our office to schedule the follow-up appointment.  Do the following things EVERYDAY: Weigh yourself in the morning before breakfast. Write it down and keep it in a log. Take your medicines as prescribed Eat low salt foods--Limit salt (sodium) to 2000 mg per day.  Stay as active as you can everyday Limit all fluids for the day to less than 2 liters  At the [facility], you and your health needs are our priority. As part of our continuing mission to provide you with exceptional heart care, we have created designated Provider Care Teams. These Care Teams include your primary Cardiologist (physician) and Advanced Practice Providers (APPs- Physician Assistants and Nurse Practitioners) who all work together to provide you with the care you need, when you need it.   You may see any of the following providers on your designated Care Team at your next follow up: Dr Glori Bickers Dr Haynes Kerns, NP Lyda Jester, [place] [facility] [place], [place] Audry Riles, PharmD   Please be sure to bring in all your medications bottles to every appointment.

## 2021-07-24 NOTE — Progress Notes (Signed)
ADVANCED HF CLINIC CONSULT NOTE  Primary Care: [name], DO HF Cardiologist: Dr. Aundra Dubin  HPI: Anna Marquez is a 71 y.o.with history of diastolic CHF, paroxysmal atrial fibrillation, and recently diagnosed hyperthyroidism.  She was was admitted with atrial fibrillation/RVR and acute on chronic diastolic CHF. Patient was on Tikosyn several years ago for AF then had ablation in 2/21 by Dr. Rayann Heman.  Tikosyn was later stopped.  Recently, she has re-developed episodes of atrial fibrillation.  She also, of noted, has been found to have hyperthyroidism likely related to multinodular goiter.  She was admitted in 5/23 with acute respiratory failure in setting of atrial fibrillation and CHF, there was some question of PNA.  She spontaneously converted to NSR.  At office visit subsequently on 06/19/21, she remained in NSR.     She was admitted  07/03/21 with atrial fibrillation with RVR and developed progressive respiratory distress. Amiodarone gtt was started and she was given Lasix, but ultimately required intubation. She developed hypotension and required NE + vasopressin with elevated lactate, and AKI. TEE showed normal LV size with EF 60-65%, mild RV dysfunction, severe LAE with moderate-severe central MR (likely atrial functional MR), no LA appendage thrombus. DCCV was attempted x 2, both times she went back into NSR but promptly reverted to AF. Eventual successful DCCV to NSR on 07/09/21. Drips weaned off, and started on oral amiodarone taper. GDMT started and she was discharged home, weight 162 lbs.  Follow up in AF clinic 07/16/21, HR 123 in Afib. Felt AF paroxysmal and needed more time for amiodarone load. Toprol started.   Today she returns for post hospital HF follow up. She overall feels weak. Feels palpitations. She has some dyspnea walking around her house and with ADLs, but feels limited mostly by weakness from her hospitalization. Denies abnormal bleeding, CP, dizziness, edema, or  PND/Orthopnea. Appetite ok. No fever or chills. Weight at home 162-165 pounds. Taking all medications.   ECG (personally reviewed): ST 129 bpm  Labs (7/23): K 4.8, creatinine 1.47, normal LFTs  Cardiac Studies:  - TEE (6/23): normal LV size with EF 60-65%, mild RV dysfunction, severe LAE with moderate-severe central MR (likely atrial functional MR), no LA appendage thrombus.  Review of Systems: [y] = yes, '[ ]'$  = no   General: Weight gain '[ ]'$ ; Weight loss '[ ]'$ ; Anorexia '[ ]'$ ; Fatigue Blue.Reese ]; Fever '[ ]'$ ; Chills '[ ]'$ ; Weakness Blue.Reese ]  Cardiac: Chest pain/pressure '[ ]'$ ; Resting SOB '[ ]'$ ; Exertional SOB Blue.Reese ]; Orthopnea '[ ]'$ ; Pedal Edema '[ ]'$ ; Palpitations '[ ]'$ ; Syncope '[ ]'$ ; Presyncope '[ ]'$ ; Paroxysmal nocturnal dyspnea'[ ]'$   Pulmonary: Cough '[ ]'$ ; Wheezing'[ ]'$ ; Hemoptysis'[ ]'$ ; Sputum '[ ]'$ ; Snoring '[ ]'$   GI: Vomiting'[ ]'$ ; Dysphagia'[ ]'$ ; Melena'[ ]'$ ; Hematochezia '[ ]'$ ; Heartburn'[ ]'$ ; Abdominal pain '[ ]'$ ; Constipation '[ ]'$ ; Diarrhea '[ ]'$ ; BRBPR '[ ]'$   GU: Hematuria'[ ]'$ ; Dysuria '[ ]'$ ; Nocturia'[ ]'$   Vascular: Pain in legs with walking '[ ]'$ ; Pain in feet with lying flat '[ ]'$ ; Non-healing sores '[ ]'$ ; Stroke '[ ]'$ ; TIA '[ ]'$ ; Slurred speech '[ ]'$ ;  Neuro: Headaches'[ ]'$ ; Vertigo'[ ]'$ ; Seizures'[ ]'$ ; Paresthesias'[ ]'$ ;Blurred vision '[ ]'$ ; Diplopia '[ ]'$ ; Vision changes '[ ]'$   Ortho/Skin: Arthritis '[ ]'$ ; Joint pain '[ ]'$ ; Muscle pain '[ ]'$ ; Joint swelling '[ ]'$ ; Back Pain '[ ]'$ ; Rash '[ ]'$   Psych: Depression'[ ]'$ ; Anxiety'[ ]'$   Heme: Bleeding problems '[ ]'$ ; Clotting disorders '[ ]'$ ; Anemia '[ ]'$   Endocrine: Diabetes '[ ]'$ ; Thyroid  dysfunction[y ]  Past Medical History:  Diagnosis Date   Adenomatous colon polyp 2003   Cholelithiasis    COVID    Diverticulosis of colon    Gastric polyps    GERD (gastroesophageal reflux disease)    Helicobacter pylori (H. pylori)    Hiatal hernia    Hypertension    Mitral valve prolapse    Nephrolithiasis    Osteoarthritis    Osteopenia 03/2017   T score -1.5 FRAX 8.9% / 0.9%.  Stable from prior DEXA 2016   Persistent atrial fibrillation  (HCC)    TGA (transient global amnesia)    Current Outpatient Medications  Medication Sig Dispense Refill   acetaminophen (TYLENOL) 500 MG tablet Take 1,000 mg by mouth every 6 (six) hours as needed for moderate pain or headache.     albuterol (PROVENTIL) (2.5 MG/3ML) 0.083% nebulizer solution Take 3 mLs (2.5 mg total) by nebulization every 4 (four) hours as needed for wheezing or shortness of breath (please include nebulizer machine, hoses, and mask if needed.). 30 mL 0   albuterol (VENTOLIN HFA) 108 (90 Base) MCG/ACT inhaler INHALE 1 TO 2 PUFFS INTO THE LUNGS EVERY 6 HOURS AS NEEDED FOR WHEEZING OR SHORTNESS OF BREATH. 8.5 g 3   amiodarone (PACERONE) 200 MG tablet Take 200 mg by mouth 2 (two) times daily.     Ascorbic Acid (VITAMIN C) 1000 MG tablet Take 1,000 mg by mouth daily.     chlorpheniramine-HYDROcodone (TUSSIONEX PENNKINETIC ER) 10-8 MG/5ML Take 5 mLs by mouth every 12 (twelve) hours as needed for cough. 115 mL 0   Cholecalciferol (VITAMIN D) 50 MCG (2000 UT) tablet Take 2,000 Units by mouth daily.     dapagliflozin propanediol (FARXIGA) 10 MG TABS tablet Take 1 tablet (10 mg total) by mouth daily. 30 tablet 0   diclofenac Sodium (VOLTAREN) 1 % GEL Apply 2-4 g topically 4 (four) times daily as needed (knees,ankles and hand pain).     ELIQUIS 5 MG TABS tablet TAKE 1 TABLET BY MOUTH 2 TIMES DAILY. 60 tablet 5   ferrous sulfate 325 (65 FE) MG tablet Take 325 mg by mouth every other day.     fluticasone (FLONASE) 50 MCG/ACT nasal spray PLACE 1 SPRAY INTO BOTH NOSTRILS AS NEEDED FOR ALLERGIES OR RHINITIS. 16 g 6   furosemide (LASIX) 20 MG tablet Patient takes 1 tablet by mouth every other day for 1 week starting today 07/24/21.     loratadine (CLARITIN) 10 MG tablet Take 10 mg by mouth daily.      methimazole (TAPAZOLE) 5 MG tablet Take 1 tablet (5 mg total) by mouth 3 (three) times daily. 30 tablet 3   metoprolol succinate (TOPROL XL) 25 MG 24 hr tablet Take 1 tablet (25 mg total) by mouth  daily. 30 tablet 6   pantoprazole (PROTONIX) 40 MG tablet TAKE 1 TABLET (40 MG TOTAL) BY MOUTH DAILY. 90 tablet 4   prednisoLONE acetate (PRED FORTE) 1 % ophthalmic suspension Place 1 drop into the left eye 4 (four) times daily.     Probiotic Product (PROBIOTIC PO) Take 1 capsule by mouth daily.     spironolactone (ALDACTONE) 25 MG tablet Take 1/2 tablet (12.5 mg total) by mouth daily. 15 tablet 0   vitamin E 180 MG (400 UNITS) capsule Take 400 Units by mouth daily.     ZINC CITRATE PO Take 22 mg by mouth daily.     potassium chloride SA (KLOR-CON M) 20 MEQ tablet Take 1 tablet (20  mEq total) by mouth daily. (Patient not taking: Reported on 07/24/2021) 30 tablet 0   No current facility-administered medications for this encounter.   Allergies  Allergen Reactions   Erythromycin     Upset stomach   Nsaids Other (See Comments)    Has a history of bleeding ulcers, tolerates aspirin    Penicillins Hives and Shortness Of Breath    Did it involve swelling of the face/tongue/throat, SOB, or low BP? Yes Did it involve sudden or severe rash/hives, skin peeling, or any reaction on the inside of your mouth or nose? No Did you need to seek medical attention at a hospital or doctor's office? No When did it last happen?      45 years If all above answers are "NO", may proceed with cephalosporin use.    Social History   Socioeconomic History   Marital status: Divorced    Spouse name: Not on file   Number of children: 2   Years of education: 12th grade   Highest education level: High school graduate  Occupational History   Occupation: Retired,  Tobacco Use   Smoking status: Never   Smokeless tobacco: Never   Tobacco comments:    Never smoke 07/16/21  Vaping Use   Vaping Use: Never used  Substance and Sexual Activity   Alcohol use: Never    Alcohol/week: 0.0 standard drinks of alcohol   Drug use: No   Sexual activity: Yes    Birth control/protection: Post-menopausal    Comment: 1st  intercourse 71 yo-Fewer than 5 partners  Other Topics Concern   Not on file  Social History Narrative   Lives along with her dog. She enjoys croteching, crafts, making wreaths, reading and gardening in her free time.    Social Determinants of Health   Financial Resource Strain: Low Risk  (03/26/2021)   Overall Financial Resource Strain (CARDIA)    Difficulty of Paying Living Expenses: Not hard at all  Food Insecurity: No Food Insecurity (03/26/2021)   Hunger Vital Sign    Worried About Running Out of Food in the Last Year: Never true    Ran Out of Food in the Last Year: Never true  Transportation Needs: No Transportation Needs (03/26/2021)   PRAPARE - Hydrologist (Medical): No    Lack of Transportation (Non-Medical): No  Physical Activity: Sufficiently Active (03/26/2021)   Exercise Vital Sign    Days of Exercise per Week: 3 days    Minutes of Exercise per Session: 120 min  Stress: No Stress Concern Present (03/26/2021)   [place]    Feeling of Stress : Not at all  Social Connections: Moderately Isolated (03/26/2021)   Social Connection and Isolation Panel [NHANES]    Frequency of Communication with Friends and Family: More than three times a week    Frequency of Social Gatherings with Friends and Family: More than three times a week    Attends Religious Services: More than 4 times per year    Active Member of [employer] or Organizations: No    Attends Archivist Meetings: Never    Marital Status: Divorced  Human resources officer Violence: Not At Risk (03/26/2021)   Humiliation, Afraid, Rape, and Kick questionnaire    Fear of Current or Ex-Partner: No    Emotionally Abused: No    Physically Abused: No    Sexually Abused: No   Family History  Problem Relation Age of Onset  Cancer Father        prostate   Breast cancer Paternal Aunt 50   Diabetes Maternal Grandmother    Heart disease  Maternal Grandmother    Heart disease Maternal Grandfather    Melanoma Paternal Aunt    Neuropathy Mother    Congestive Heart Failure Mother    Thyroid disease Daughter    BP 130/86   Pulse (!) 130   Wt 73.6 kg (162 lb 3.2 oz)   SpO2 97%   BMI 27.84 kg/m   Wt Readings from Last 3 Encounters:  07/24/21 73.6 kg (162 lb 3.2 oz)  07/18/21 73.5 kg (162 lb)  07/16/21 73.1 kg (161 lb 3.2 oz)   PHYSICAL EXAM: General:  NAD. No resp difficulty HEENT: Normal Neck: Supple. No JVD. Carotids 2+ bilat; no bruits. No lymphadenopathy or thryomegaly appreciated. Cor: PMI nondisplaced. Tachy regular rate & rhythm. No rubs, gallops or murmurs. Lungs: Clear Abdomen: Soft, nontender, nondistended. No hepatosplenomegaly. No bruits or masses. Good bowel sounds. Extremities: No cyanosis, clubbing, rash, edema Neuro: Alert & oriented x 3, cranial nerves grossly intact. Moves all 4 extremities w/o difficulty. Affect pleasant.  ASSESSMENT & PLAN: 1. Chronic diastolic CHF: TEE on 2/58/52 showed normal LV size with EF 60-65%, mild RV dysfunction, severe LAE with moderate-severe central MR (likely atrial functional MR). Suspect CHF exacerbation was triggered by atrial fibrillation, also mitral regurgitation may have been worsened by atrial fibrillation.  She had a recent prior admission with CHF in the setting of atrial fibrillation, consistent with flash pulmonary edema. NYHA II today, she is not volume overloaded on exam.  - Increase spironolactone to 25 mg daily and stop KCL supplement. BMET today, repeat in 1 week. - Change Lasix 20 mg PRN. - Continue Toprol XL 25 mg daily. - Continue Farxiga 10 mg daily.  - Need to keep her in NSR, decompensates in AF.  - Ischemia as a contributor to flash pulmonary edema is possible though less likely. Would consider LHC/RHC in the future, not urgent.  2. Atrial fibrillation: Persistent.  She was on Tikosyn in the past then had AF ablation in 2/21, Tikosyn stopped after  this.  She was not on an antiarrhythmic prior to admission.  Has had increased frequency of AF recently, was admitted in 5/23 with AF and CHF exacerbation. She has also been noted to have developed hyperthyroidism prior to this admission, this can help trigger AF. Need to keep in NSR due to worsening of HF in AF.  DCCV 07/05/21 failed, but she was still on low dose norepinephrine. DCCV 07/09/21 successful, she is in [place] on ECG today. - Continue amiodarone taper.  - Continue Eliquis 5 mg bid. CBC today.  - Amiodarone is not an ideal medication for her (she already has hyperthyroidism).  Would like to avoid this long-term. Will use for the time being to keep her in NSR, she has EP follow up soon to discuss a redo AF ablation, hopefully allowing her to safely stop amiodarone.  3. H/o Shock: Patient developed shock after intubation requiring vasopressin and NE.  This may have been related to sedation.  No localizing signs for infection.  Antibiotics were not started. Do not think she had septic shock.   - Resolved. 4. H/o AKI: In setting of shock, creatinine up to 2.93.  Baseline around 1.  - BMET today. 5. Hyperthyroidism: She had this prior to admission (prior to amiodarone use).  Has multinodular goiter. Low TSH with elevated free T4, but free  T3 low this admission.   - Continue methimazole 5 mg tid. PCP now following. - As above, amiodarone is not ideal but think necessary for the time being.   Follow up in 3-4 weeks with PharmD (increase Toprol and/or start low dose losartan) and 3 months with Dr. Aundra Dubin.  Allena Katz, FNP-BC 07/24/21

## 2021-07-25 ENCOUNTER — Telehealth (HOSPITAL_COMMUNITY): Payer: Self-pay | Admitting: Cardiology

## 2021-07-25 MED ORDER — SPIRONOLACTONE 25 MG PO TABS: 12.5000 mg | ORAL_TABLET | Freq: Every day | ORAL | 11 refills | Status: DC

## 2021-07-25 MED ORDER — FUROSEMIDE 20 MG PO TABS: 20.0000 mg | ORAL_TABLET | ORAL | 3 refills | Status: DC | PRN

## 2021-07-25 NOTE — Telephone Encounter (Signed)
Patient called.  Patient aware. Pt will have labs repeated at [facility] next week

## 2021-07-25 NOTE — Telephone Encounter (Signed)
-----   Message from Rafael Bihari, [place] sent at 07/24/2021  2:04 PM EDT ----- Kidney function remains mildly elevated, K is on high end of normal.  Do not increase spiro as discussed, keep at 12.5 mg daily.   Stop all KCL supplements and change Lasix to 20 mg PRN weight gain/swelling.  She has repeat labs arranged in 1 week.

## 2021-07-26 ENCOUNTER — Telehealth (HOSPITAL_COMMUNITY): Payer: Self-pay | Admitting: *Deleted

## 2021-07-26 MED ORDER — AMIODARONE HCL 200 MG PO TABS: 200.0000 mg | ORAL_TABLET | Freq: Every day | ORAL | 3 refills | Status: DC

## 2021-07-26 MED ORDER — DAPAGLIFLOZIN PROPANEDIOL 10 MG PO TABS: 10.0000 mg | ORAL_TABLET | Freq: Every day | ORAL | 5 refills | Status: DC

## 2021-07-26 MED ORDER — METHIMAZOLE 5 MG PO TABS: 5.0000 mg | ORAL_TABLET | Freq: Three times a day (TID) | ORAL | 0 refills | Status: DC

## 2021-07-26 NOTE — Telephone Encounter (Signed)
Refills sent

## 2021-07-26 NOTE — Telephone Encounter (Signed)
Pt called and left message stating she needed refills of farxiga and tapazole sent to [facility]. Pt following in HF clinic will forward to refills

## 2021-07-30 DIAGNOSIS — H25813 Combined forms of age-related cataract, bilateral: Secondary | ICD-10-CM | POA: Diagnosis not present

## 2021-07-30 DIAGNOSIS — B0052 Herpesviral keratitis: Secondary | ICD-10-CM | POA: Diagnosis not present

## 2021-07-31 ENCOUNTER — Encounter: Payer: Self-pay | Admitting: Cardiology

## 2021-07-31 ENCOUNTER — Ambulatory Visit (INDEPENDENT_AMBULATORY_CARE_PROVIDER_SITE_OTHER): Payer: PPO | Admitting: Cardiology

## 2021-07-31 ENCOUNTER — Encounter: Payer: Self-pay | Admitting: *Deleted

## 2021-07-31 VITALS — BP 110/84 | HR 143 | Ht 64.0 in | Wt 163.0 lb

## 2021-07-31 DIAGNOSIS — D6869 Other thrombophilia: Secondary | ICD-10-CM

## 2021-07-31 DIAGNOSIS — I4819 Other persistent atrial fibrillation: Secondary | ICD-10-CM | POA: Diagnosis not present

## 2021-07-31 DIAGNOSIS — Z01812 Encounter for preprocedural laboratory examination: Secondary | ICD-10-CM

## 2021-07-31 MED ORDER — METOPROLOL SUCCINATE ER 100 MG PO TB24: 100.0000 mg | ORAL_TABLET | Freq: Every day | ORAL | 3 refills | Status: DC

## 2021-07-31 NOTE — Patient Instructions (Addendum)
Medication Instructions:  Your physician has recommended you make the following change in your medication:  INCREASE Metoprolol Succinate (Toprol) to 100 mg once daily at bedtime  *If you need a refill on your cardiac medications before your next appointment, please call your pharmacy*   Lab Work: Pre procedure labs -- see procedure instruction letter:  BMP & CBC  If you have labs (blood work) drawn today and your tests are completely normal, you will receive your results only by: [place] (if you have MyChart) OR A paper copy in the mail If you have any lab test that is abnormal or we need to change your treatment, we will call you to review the results.   Testing/Procedures: Your physician has requested that you have cardiac CT within 7 days PRIOR to your ablation. Cardiac computed tomography (CT) is a painless test that uses an x-ray machine to take clear, detailed pictures of your heart.  Please follow instruction below located under "other instructions". You will get a call from our office to schedule the date for this test.  Your physician has recommended that you have an ablation. Catheter ablation is a medical procedure used to treat some cardiac arrhythmias (irregular heartbeats). During catheter ablation, a long, thin, flexible tube is put into a blood vessel in your groin (upper thigh), or neck. This tube is called an ablation catheter. It is then guided to your heart through the blood vessel. Radio frequency waves destroy small areas of heart tissue where abnormal heartbeats may cause an arrhythmia to start. Please follow instruction letter given to you today.   Follow-Up: At [facility], you and your health needs are our priority.  As part of our continuing mission to provide you with exceptional heart care, we have created designated Provider Care Teams.  These Care Teams include your primary Cardiologist (physician) and Advanced Practice Providers (APPs -  Physician  Assistants and Nurse Practitioners) who all work together to provide you with the care you need, when you need it.  Your next appointment:   1 month(s) after your ablation  The format for your next appointment:   In Person  Provider:   AFib clinic   Thank you for choosing [facility]!!   Trinidad Curet, RN [phone]    Other Instructions  Cardiac Ablation Cardiac ablation is a procedure to destroy (ablate) some heart tissue that is sending bad signals. These bad signals cause problems in heart rhythm. The heart has many areas that make these signals. If there are problems in these areas, they can make the heart beat in a way that is not normal. Destroying some tissues can help make the heart rhythm normal. Tell your doctor about: Any allergies you have. All medicines you are taking. These include vitamins, herbs, eye drops, creams, and over-the-counter medicines. Any problems you or family members have had with medicines that make you fall asleep (anesthetics). Any blood disorders you have. Any surgeries you have had. Any medical conditions you have, such as kidney failure. Whether you are pregnant or may be pregnant. What are the risks? This is a safe procedure. But problems may occur, including: Infection. Bruising and bleeding. Bleeding into the chest. Stroke or blood clots. Damage to nearby areas of your body. Allergies to medicines or dyes. The need for a pacemaker if the normal system is damaged. Failure of the procedure to treat the problem. What happens before the procedure? Medicines Ask your doctor about: Changing or stopping your normal medicines. This  is important. Taking aspirin and ibuprofen. Do not take these medicines unless your doctor tells you to take them. Taking other medicines, vitamins, herbs, and supplements. General instructions Follow instructions from your doctor about what you cannot eat or drink. Plan to have someone take you home  from the hospital or clinic. If you will be going home right after the procedure, plan to have someone with you for 24 hours. Ask your doctor what steps will be taken to prevent infection. What happens during the procedure?  An IV tube will be put into one of your veins. You will be given a medicine to help you relax. The skin on your neck or groin will be numbed. A cut (incision) will be made in your neck or groin. A needle will be put through your cut and into a large vein. A tube (catheter) will be put into the needle. The tube will be moved to your heart. Dye may be put through the tube. This helps your doctor see your heart. Small devices (electrodes) on the tube will send out signals. A type of energy will be used to destroy some heart tissue. The tube will be taken out. Pressure will be held on your cut. This helps stop bleeding. A bandage will be put over your cut. The exact procedure may vary among doctors and hospitals. What happens after the procedure? You will be watched until you leave the hospital or clinic. This includes checking your heart rate, breathing rate, oxygen, and blood pressure. Your cut will be watched for bleeding. You will need to lie still for a few hours. Do not drive for 24 hours or as long as your doctor tells you. Summary Cardiac ablation is a procedure to destroy some heart tissue. This is done to treat heart rhythm problems. Tell your doctor about any medical conditions you may have. Tell him or her about all medicines you are taking to treat them. This is a safe procedure. But problems may occur. These include infection, bruising, bleeding, and damage to nearby areas of your body. Follow what your doctor tells you about food and drink. You may also be told to change or stop some of your medicines. After the procedure, do not drive for 24 hours or as long as your doctor tells you. This information is not intended to replace advice given to you by your  health care provider. Make sure you discuss any questions you have with your health care provider. Document Revised: 11/26/2018 Document Reviewed: 11/26/2018 Elsevier Patient Education  [place].

## 2021-07-31 NOTE — Addendum Note (Signed)
Addended by: Stanton Kidney on: 07/31/2021 03:01 PM   Modules accepted: Orders

## 2021-07-31 NOTE — Progress Notes (Signed)
Electrophysiology Office Note   Date:  07/31/2021   ID:  Anna Marquez, DOB Feb 21, 1950, MRN [id number]  PCP:  Anna Nutting, DO  Cardiologist:  Anna Marquez Primary Electrophysiologist:  Paul Torpey Anna Leeds, MD    Chief Complaint: AF   History of Present Illness: Anna Marquez is a 71 y.o. female who is being seen today for the evaluation of AF at the request of Anna Nutting, DO. Presenting today for electrophysiology evaluation.  She has a history significant for atrial fibrillation, hypertension, mitral valve prolapse.  She is status post atrial fibrillation ablation 02/11/2019.  May 2023 she was admitted to the hospital with respiratory failure in the setting of atrial fibrillation and heart failure.  She spontaneously converted to sinus rhythm.  She was admitted again 07/03/2021 with distress.  She was found to be in atrial fibrillation emergency room.  She was ultimately intubated.  Cardioversion was attempted but she promptly reverted back to atrial fibrillation each time she was converted.  She had a load of amiodarone and had a successful cardioversion 07/09/2021.  She went back into atrial fibrillation multiple times since then.  Today, she denies symptoms of palpitations, chest pain, orthopnea, PND, lower extremity edema, claudication, dizziness, presyncope, syncope, bleeding, or neurologic sequela. The patient is tolerating medications without difficulties.  Currently feels short of breath and fatigue.  She is converted to atrial flutter.  She finds it difficult to do her daily activities due to her shortness of breath and fatigue.  She is also having palpitations.   Past Medical History:  Diagnosis Date   Adenomatous colon polyp 2003   Cholelithiasis    COVID    Diverticulosis of colon    Gastric polyps    GERD (gastroesophageal reflux disease)    Helicobacter pylori (H. pylori)    Hiatal hernia    Hypertension    Mitral valve prolapse    Nephrolithiasis    Osteoarthritis     Osteopenia 03/2017   T score -1.5 FRAX 8.9% / 0.9%.  Stable from prior DEXA 2016   Persistent atrial fibrillation (HCC)    TGA (transient global amnesia)    Past Surgical History:  Procedure Laterality Date   ANTERIOR AND POSTERIOR REPAIR N/A 06/14/2014   Procedure: ANTERIOR (CYSTOCELE) AND POSTERIOR REPAIR (RECTOCELE);  Surgeon: Anna Auerbach, MD;  Location: [facility];  Service: Gynecology;  Laterality: N/A;   ATRIAL FIBRILLATION ABLATION N/A 02/11/2019   Procedure: ATRIAL FIBRILLATION ABLATION;  Surgeon: Anna Grayer, MD;  Location: [facility];  Service: Cardiovascular;  Laterality: N/A;   CARDIOVERSION N/A 01/15/2019   Procedure: CARDIOVERSION;  Surgeon: Anna Records, MD;  Location: [facility]COPY;  Service: Cardiovascular;  Laterality: N/A;   CARDIOVERSION N/A 07/09/2021   Procedure: CARDIOVERSION;  Surgeon: Anna Dresser, MD;  Location: [facility] ENDOSCOPY;  Service: Cardiovascular;  Laterality: N/A;   CHOLECYSTECTOMY     kidney stone removal     x2   VAGINAL HYSTERECTOMY N/A 06/14/2014   Procedure: HYSTERECTOMY VAGINAL;  Surgeon: Anna Auerbach, MD;  Location: [facility];  Service: Gynecology;  Laterality: N/A;     Current Outpatient Medications  Medication Sig Dispense Refill   acetaminophen (TYLENOL) 500 MG tablet Take 1,000 mg by mouth every 6 (six) hours as needed for moderate pain or headache.     albuterol (PROVENTIL) (2.5 MG/3ML) 0.083% nebulizer solution Take 3 mLs (2.5 mg total) by nebulization every 4 (four) hours as needed for wheezing or shortness of breath (please include nebulizer machine,  hoses, and mask if needed.). 30 mL 0   albuterol (VENTOLIN HFA) 108 (90 Base) MCG/ACT inhaler INHALE 1 TO 2 PUFFS INTO THE LUNGS EVERY 6 HOURS AS NEEDED FOR WHEEZING OR SHORTNESS OF BREATH. 8.5 g 3   amiodarone (PACERONE) 200 MG tablet Take 1 tablet (200 mg total) by mouth daily. 30 tablet 3   Ascorbic Acid (VITAMIN C) 1000 MG tablet Take 1,000 mg by mouth daily.      chlorpheniramine-HYDROcodone (TUSSIONEX PENNKINETIC ER) 10-8 MG/5ML Take 5 mLs by mouth every 12 (twelve) hours as needed for cough. 115 mL 0   Cholecalciferol (VITAMIN D) 50 MCG (2000 UT) tablet Take 2,000 Units by mouth daily.     dapagliflozin propanediol (FARXIGA) 10 MG TABS tablet Take 1 tablet (10 mg total) by mouth daily. 30 tablet 5   diclofenac Sodium (VOLTAREN) 1 % GEL Apply 2-4 g topically 4 (four) times daily as needed (knees,ankles and hand pain).     ELIQUIS 5 MG TABS tablet TAKE 1 TABLET BY MOUTH 2 TIMES DAILY. 60 tablet 5   ferrous sulfate 325 (65 FE) MG tablet Take 325 mg by mouth every other day.     fluticasone (FLONASE) 50 MCG/ACT nasal spray PLACE 1 SPRAY INTO BOTH NOSTRILS AS NEEDED FOR ALLERGIES OR RHINITIS. 16 g 6   furosemide (LASIX) 20 MG tablet Take 1 tablet (20 mg total) by mouth as needed for edema or fluid. 90 tablet 3   loratadine (CLARITIN) 10 MG tablet Take 10 mg by mouth daily.      methimazole (TAPAZOLE) 5 MG tablet Take 1 tablet (5 mg total) by mouth 3 (three) times daily. 90 tablet 0   metoprolol succinate (TOPROL XL) 25 MG 24 hr tablet Take 1 tablet (25 mg total) by mouth daily. 30 tablet 6   neomycin-polymyxin b-dexamethasone (MAXITROL) 3.5-10000-0.1 OINT Place into the left eye every 4 (four) hours.     pantoprazole (PROTONIX) 40 MG tablet TAKE 1 TABLET (40 MG TOTAL) BY MOUTH DAILY. 90 tablet 4   Probiotic Product (PROBIOTIC PO) Take 1 capsule by mouth daily.     spironolactone (ALDACTONE) 25 MG tablet Take 0.5 tablets (12.5 mg total) by mouth daily. 15 tablet 11   valACYclovir (VALTREX) 1000 MG tablet Take 1 tablet by mouth in the morning, at noon, and at bedtime.     vitamin E 180 MG (400 UNITS) capsule Take 400 Units by mouth daily.     ZINC CITRATE PO Take 22 mg by mouth daily.     No current facility-administered medications for this visit.    Allergies:   Erythromycin, Nsaids, and Penicillins   Social History:  The patient  reports that she has  never smoked. She has never used smokeless tobacco. She reports that she does not drink alcohol and does not use drugs.   Family History:  The patient's family history includes Breast cancer (age of onset: 53) in her paternal aunt; Cancer in her father; Congestive Heart Failure in her mother; Diabetes in her maternal grandmother; Heart disease in her maternal grandfather and maternal grandmother; Melanoma in her paternal aunt; Neuropathy in her mother; Thyroid disease in her daughter.    ROS:  Please see the history of present illness.   Otherwise, review of systems is positive for none.   All other systems are reviewed and negative.    PHYSICAL EXAM: VS:  BP 110/84   Pulse (!) 143   Ht '5\' 4"'$  (1.626 m)   Wt 163 lb (73.9  kg)   SpO2 98%   BMI 27.98 kg/m  , BMI Body mass index is 27.98 kg/m. GEN: Well nourished, well developed, in no acute distress  HEENT: normal  Neck: no JVD, carotid bruits, or masses Cardiac: Tachycardic, regular; no murmurs, rubs, or gallops,no edema  Respiratory:  clear to auscultation bilaterally, normal work of breathing GI: soft, nontender, nondistended, + BS MS: no deformity or atrophy  Skin: warm and dry Neuro:  Strength and sensation are intact Psych: euthymic mood, full affect  EKG:  EKG is ordered today. Personal review of the ekg ordered shows atrial flutter, rate 144  Recent Labs: 07/10/2021: Magnesium 2.3 07/18/2021: ALT 22; TSH 3.77 07/24/2021: B Natriuretic Peptide 30.3; BUN 21; Creatinine, Ser 1.46; Hemoglobin 12.8; Platelets 193; Potassium 5.1; Sodium 136    Lipid Panel     Component Value Date/Time   CHOL 179 08/07/2020 0000   TRIG 503 (H) 07/05/2021 0100   HDL 46 (L) 08/07/2020 0000   CHOLHDL 3.9 08/07/2020 0000   VLDL 51.8 (H) 11/04/2018 1128   LDLCALC 80 08/07/2020 0000   LDLDIRECT 100.0 11/04/2018 1128     Wt Readings from Last 3 Encounters:  07/31/21 163 lb (73.9 kg)  07/24/21 162 lb 3.2 oz (73.6 kg)  07/18/21 162 lb (73.5 kg)       Other studies Reviewed: Additional studies/ Marquez that were reviewed today include: TTE 07/04/21  Review of the above Marquez today demonstrates:   1. Left ventricular ejection fraction, by estimation, is 50 to 55%. The  left ventricle has low normal function. The left ventricle has no regional  wall motion abnormalities.   2. Right ventricular systolic function is moderately reduced. The right  ventricular size is mildly enlarged. There is normal pulmonary artery  systolic pressure. The estimated right ventricular systolic pressure is  56.3 mmHg.   3. A small pericardial effusion is present. The pericardial effusion is  surrounding the apex and localized near the right ventricle. There is no  evidence of cardiac tamponade.   4. PISA 0.2 cm. The mitral valve is normal in structure. Mild to moderate  mitral valve regurgitation. No evidence of mitral stenosis.   5. Tricuspid valve regurgitation is mild to moderate.   6. The aortic valve is normal in structure. Aortic valve regurgitation is  not visualized. No aortic stenosis is present.   7. The inferior vena cava is dilated in size with >50% respiratory  variability, suggesting right atrial pressure of 8 mmHg.    ASSESSMENT AND PLAN:  1.  Persistent atrial fibrillation/flutter: Status post ablation 02/11/2019.  Currently on amiodarone 200 mg daily, Eliquis 5 mg twice daily.  CHA2DS2-VASc of 3.  She would prefer to be off of amiodarone.  Due to that, we Bronson Bressman plan for repeat ablation.  Her heart rate is quite rapid.  She feels quite poorly.  We Jules Vidovich increase her Toprol-XL to 100 mg daily.  We Mischell Branford have her follow-up in A-fib clinic in 2 weeks and if she remains tachycardic, she Kischa Altice need cardioversion.  Risk, benefits, and alternatives to EP study and radiofrequency ablation for afib were also discussed in detail today. These risks include but are not limited to stroke, bleeding, vascular damage, tamponade, perforation, damage to the  esophagus, lungs, and other structures, pulmonary vein stenosis, worsening renal function, and death. The patient understands these risk and wishes to proceed.  We Jacoya Bauman therefore proceed with catheter ablation at the next available time.  Carto, ICE, anesthesia are requested for the  procedure.  Pernell Dikes also obtain CT PV protocol prior to the procedure to exclude LAA thrombus and further evaluate atrial anatomy.  2.  Secondary hypercoagulable state: Currently on Eliquis for atrial fibrillation as above  3.  Mitral valve prolapse: Mild to moderate MR.  Plan per primary cardiology.  4.  Hypertension: well controlled  5.  Chronic diastolic heart failure: Appears euvolemic.  Followed in heart failure clinic.   Current medicines are reviewed at length with the patient today.   The patient does not have concerns regarding her medicines.  The following changes were made today:  none  Labs/ tests ordered today include:  Orders Placed This Encounter  Procedures   CT CARDIAC MORPH/PULM VEIN W/CM&W/O CA SCORE   Basic metabolic panel   CBC   EKG 12-Lead     Disposition:   FU with Tiki Tucciarone 3 months  Signed, Darius Fillingim Anna Leeds, MD  07/31/2021 2:54 PM     [facility] [address] [place] [address] [phone] (office) [phone] (fax)

## 2021-08-06 DIAGNOSIS — H2513 Age-related nuclear cataract, bilateral: Secondary | ICD-10-CM | POA: Diagnosis not present

## 2021-08-06 DIAGNOSIS — B0059 Other herpesviral disease of eye: Secondary | ICD-10-CM | POA: Diagnosis not present

## 2021-08-09 ENCOUNTER — Encounter (HOSPITAL_COMMUNITY): Payer: Self-pay

## 2021-08-09 ENCOUNTER — Other Ambulatory Visit (HOSPITAL_COMMUNITY): Payer: Self-pay | Admitting: *Deleted

## 2021-08-09 ENCOUNTER — Encounter: Payer: Self-pay | Admitting: Cardiology

## 2021-08-09 MED ORDER — METOPROLOL SUCCINATE ER 100 MG PO TB24: 100.0000 mg | ORAL_TABLET | Freq: Every day | ORAL | 3 refills | Status: DC

## 2021-08-09 MED ORDER — METOPROLOL SUCCINATE ER 25 MG PO TB24: 25.0000 mg | ORAL_TABLET | Freq: Every day | ORAL | Status: DC

## 2021-08-10 NOTE — Telephone Encounter (Signed)
Mailbox full, unable to leave message.  Forwarding to April to follow up on next week as I am not back in the office until 8/14

## 2021-08-11 ENCOUNTER — Telehealth: Payer: Self-pay | Admitting: Physician Assistant

## 2021-08-11 NOTE — Telephone Encounter (Signed)
Pt called because of low BP. 73/66, 82/76 today, this am. 117/84, HR 97 now.   Pt was in rapid atrial fib when seen 07/26. She was changed to 100 mg Toprol XL from 25 mg qd.   She called back because HR still not controlled >> was asked to add 25 mg Toprol XL in the morning.   She did not take the Lasix today, but did take the spiro.  She feels better and HR is better controlled.   Plan: stop the spiro for now. Continue the Lasix Assess BP in the morning and decide on taking the 25 mg meto. Keep afib clinic next week.  Rosaria Ferries, PA-C 08/11/2021 2:45 PM,

## 2021-08-14 ENCOUNTER — Inpatient Hospital Stay (HOSPITAL_COMMUNITY): Admission: RE | Admit: 2021-08-14 | Payer: PPO | Source: Ambulatory Visit

## 2021-08-14 ENCOUNTER — Encounter (HOSPITAL_COMMUNITY): Payer: Self-pay | Admitting: Nurse Practitioner

## 2021-08-14 ENCOUNTER — Encounter (HOSPITAL_COMMUNITY): Payer: Self-pay

## 2021-08-14 ENCOUNTER — Ambulatory Visit (HOSPITAL_COMMUNITY)
Admission: RE | Admit: 2021-08-14 | Discharge: 2021-08-14 | Disposition: A | Discharge status: Home / Self Care | Payer: PPO | Source: Ambulatory Visit | Attending: Nurse Practitioner | Admitting: Nurse Practitioner

## 2021-08-14 VITALS — BP 132/78 | HR 75 | Ht 64.0 in | Wt 166.2 lb

## 2021-08-14 DIAGNOSIS — I5032 Chronic diastolic (congestive) heart failure: Secondary | ICD-10-CM | POA: Diagnosis not present

## 2021-08-14 DIAGNOSIS — I110 Hypertensive heart disease with heart failure: Secondary | ICD-10-CM | POA: Diagnosis not present

## 2021-08-14 DIAGNOSIS — E0590 Thyrotoxicosis, unspecified without thyrotoxic crisis or storm: Secondary | ICD-10-CM | POA: Diagnosis not present

## 2021-08-14 DIAGNOSIS — I341 Nonrheumatic mitral (valve) prolapse: Secondary | ICD-10-CM | POA: Diagnosis not present

## 2021-08-14 DIAGNOSIS — I4892 Unspecified atrial flutter: Secondary | ICD-10-CM | POA: Diagnosis not present

## 2021-08-14 DIAGNOSIS — I4819 Other persistent atrial fibrillation: Secondary | ICD-10-CM | POA: Insufficient documentation

## 2021-08-14 LAB — BASIC METABOLIC PANEL
Anion gap: 8 (ref 5–15)
BUN: 11 mg/dL (ref 8–23)
CO2: 23 mmol/L (ref 22–32)
Calcium: 9.3 mg/dL (ref 8.9–10.3)
Chloride: 110 mmol/L (ref 98–111)
Creatinine, Ser: 0.98 mg/dL (ref 0.44–1.00)
GFR, Estimated: >60 mL/min (ref >60)
Glucose, Bld: 103 mg/dL — ABNORMAL HIGH (ref 70–99)
Potassium: 3.3 mmol/L — ABNORMAL LOW (ref 3.5–5.1)
Sodium: 141 mmol/L (ref 135–145)

## 2021-08-14 LAB — CBC
HCT: 32.6 % — ABNORMAL LOW (ref 36.0–46.0)
Hemoglobin: 11.4 g/dL — ABNORMAL LOW (ref 12.0–15.0)
MCH: 32.2 pg (ref 26.0–34.0)
MCHC: 35 g/dL (ref 30.0–36.0)
MCV: 92.1 fL (ref 80.0–100.0)
Platelets: 215 10*3/uL (ref 150–400)
RBC: 3.54 MIL/uL — ABNORMAL LOW (ref 3.87–5.11)
RDW: 17.1 % — ABNORMAL HIGH (ref 11.5–15.5)
WBC: 4.8 10*3/uL (ref 4.0–10.5)
nRBC: 0 % (ref 0.0–0.2)

## 2021-08-14 NOTE — Progress Notes (Addendum)
Primary Care Physician: [name], DO Referring Physician: ER f/u Cardiologist: Dr. Acie Fredrickson  Primary EP: Dr Rayann Heman   Anna Marquez is a 71 y.o. female with a h/o paroxysmal afib first dx March 2018. She was originally  seen  in the afib office for having afib with RVR  in the ER,  and receiving successful cardioversion 11/05/18. Unfortunately, she was back in rapid afib at 141 bpm.   She had an echo and a ETT in 2018. EKG in rhythm in 2018 showed SR with a first degree av block.  In the past she would take an extra corgard to get back in rhythm. No outstanding triggers.   Return to afib clinic 12/16/18.  Repeat Echo showed reduced EF at 45-50% and Zio patch showed continuous afib with  HR control  at 96 bpm, max at 218 ms, minimum at 54 bpm.  Discussed antiarrythmic's with pt. With the reduced EF and evidence of first degree AV block on a prior EKG in rhythm, it would be by guidelines to avoid Multaq  and flecainide. Discussed with pt use of  tikosyn and she wants to pursue.   Back in afib, clinic for Tikosyn admit, 12/21/18. She states that her afib has been running over 100 bpm for the last   several days. It is making her more fatigued.  In the clinic v rates in the 120's. She  is getting more symptomatic with it. Her weight is stable.   F/u in afib clinic, 01/04/19.  She is in afib this am but has felt so much better and was surprised  that she was out of rhythm. She went in and out while in the hospital  but did break with Tikosyn. Did not require DCCV. The dose had to be reduced to 250 mcg  because the qtc did prolong. Continues  on eliquis with a CHA2DS2VASc score of 3.  F/u in afib clinic, 01/11/19. EKG shows afib. Her records at home indicate that her HR's have been elevated so I suspect that she has been in afib since I last saw her last week. The shortness of breath improved after hospitalization for Tikosyn but now  has  returned.  Review os her TSH over time shows low TSH readings  but T4 when rechecked in November  was in normal range.   F/u in afib clinic, 01/22/19. Had successful cardioversion but yesterday went back out of rhythm. She is feeling better right now than right before DCCV.EKG shows atrial flutter at 107 bpm. She  is interested in having afib ablation. She has an appointment with Dr. Rayann Heman to further discuss on 1/28.Contiues on Tikosyn and eliquis with CHA2DS2VASc score of 3.   F/u in afib clinic, 03/11/19. She is now one month qfib ablation.. She has been staying in SR on dofetilide. She had been doing great unitl she got her covid shot one week ago. Since this she has had elevated BP and H/A. No afib . No swallowing or groin issues.   Follow up in the AF clinic 07/16/21. She was admitted in 5/23 with acute respiratory failure in setting of atrial fibrillation and CHF, there was some question of PNA.  She spontaneously converted to NSR.  At office visit subsequently on 06/19/21, she remained in NSR. She was admitted again on 6/27 with dyspnea.  She reported 2 days of dyspnea prior to admission.  In ER, she was in atrial fibrillation with RVR.  She developed progressive respiratory distress.  Amiodarone gtt  was started and she was given Lasix.  She was ultimately intubated. DCCV was attempted x 2, both times she went back into NSR but promptly reverted to AF. She continued to load on amiodarone and had DCCV again on 07/09/21 which was successful. She reports that she went back into afib on 7/5, then back into SR on 7/7, then afib on 7/8. Appears to be paroxysmal at this point. Overall, she does feel better with no symptoms of fluid retention. She is fatigued.   F/u in afib clinic, 08/15/21. She had Toprol increased by 100 mg daily when pt saw Dr. Curt Bears 7/25 for afib with RVR. She then had hypotension over the weekend and metoprolol was decreased form 125 mg daily to 100 mg daily. She was told to hold spironolactone as well. She returned to SR at some point after that . Today  in the clinic, she has returned to SR at 75 bpm. BP is 132/78. She has returned to taking spironolactone but checks her BP in  the am before she take her am pills. She feels improved today. She is scheduled to have a redo ablation in November. She is currenlty taking amiodarone at 200 mg daily. She also continues on daily lasix. Weight is stable. Weighing daily. Limiting fluids to 2L and watching sodium intake.   Today, she denies symptoms of palpitations, chest pain, shortness of breath, orthopnea, PND, lower extremity edema, dizziness, presyncope, syncope, or neurologic sequela. The patient is tolerating medications without difficulties and is otherwise without complaint today.   Past Medical History:  Diagnosis Date   Adenomatous colon polyp 2003   Cholelithiasis    COVID    Diverticulosis of colon    Gastric polyps    GERD (gastroesophageal reflux disease)    Helicobacter pylori (H. pylori)    Hiatal hernia    Hypertension    Mitral valve prolapse    Nephrolithiasis    Osteoarthritis    Osteopenia 03/2017   T score -1.5 FRAX 8.9% / 0.9%.  Stable from prior DEXA 2016   Persistent atrial fibrillation (HCC)    TGA (transient global amnesia)    Past Surgical History:  Procedure Laterality Date   ANTERIOR AND POSTERIOR REPAIR N/A 06/14/2014   Procedure: ANTERIOR (CYSTOCELE) AND POSTERIOR REPAIR (RECTOCELE);  Surgeon: Anastasio Auerbach, MD;  Location: [facility];  Service: Gynecology;  Laterality: N/A;   ATRIAL FIBRILLATION ABLATION N/A 02/11/2019   Procedure: ATRIAL FIBRILLATION ABLATION;  Surgeon: Thompson Grayer, MD;  Location: [facility];  Service: Cardiovascular;  Laterality: N/A;   CARDIOVERSION N/A 01/15/2019   Procedure: CARDIOVERSION;  Surgeon: Fay Records, MD;  Location: [facility]COPY;  Service: Cardiovascular;  Laterality: N/A;   CARDIOVERSION N/A 07/09/2021   Procedure: CARDIOVERSION;  Surgeon: Larey Dresser, MD;  Location: [facility] ENDOSCOPY;  Service: Cardiovascular;  Laterality:  N/A;   CHOLECYSTECTOMY     kidney stone removal     x2   VAGINAL HYSTERECTOMY N/A 06/14/2014   Procedure: HYSTERECTOMY VAGINAL;  Surgeon: Anastasio Auerbach, MD;  Location: [facility];  Service: Gynecology;  Laterality: N/A;    Current Outpatient Medications  Medication Sig Dispense Refill   acetaminophen (TYLENOL) 500 MG tablet Take 1,000 mg by mouth every 6 (six) hours as needed for moderate pain or headache.     albuterol (PROVENTIL) (2.5 MG/3ML) 0.083% nebulizer solution Take 3 mLs (2.5 mg total) by nebulization every 4 (four) hours as needed for wheezing or shortness of breath (please include nebulizer machine, hoses,  and mask if needed.). 30 mL 0   albuterol (VENTOLIN HFA) 108 (90 Base) MCG/ACT inhaler INHALE 1 TO 2 PUFFS INTO THE LUNGS EVERY 6 HOURS AS NEEDED FOR WHEEZING OR SHORTNESS OF BREATH. 8.5 g 3   amiodarone (PACERONE) 200 MG tablet Take 1 tablet (200 mg total) by mouth daily. 30 tablet 3   Ascorbic Acid (VITAMIN C) 1000 MG tablet Take 1,000 mg by mouth daily.     chlorpheniramine-HYDROcodone (TUSSIONEX PENNKINETIC ER) 10-8 MG/5ML Take 5 mLs by mouth every 12 (twelve) hours as needed for cough. 115 mL 0   Cholecalciferol (VITAMIN D) 50 MCG (2000 UT) tablet Take 2,000 Units by mouth daily.     dapagliflozin propanediol (FARXIGA) 10 MG TABS tablet Take 1 tablet (10 mg total) by mouth daily. 30 tablet 5   diclofenac Sodium (VOLTAREN) 1 % GEL Apply 2-4 g topically 4 (four) times daily as needed (knees,ankles and hand pain).     ELIQUIS 5 MG TABS tablet TAKE 1 TABLET BY MOUTH 2 TIMES DAILY. 60 tablet 5   ferrous sulfate 325 (65 FE) MG tablet Take 325 mg by mouth every other day.     fluticasone (FLONASE) 50 MCG/ACT nasal spray PLACE 1 SPRAY INTO BOTH NOSTRILS AS NEEDED FOR ALLERGIES OR RHINITIS. 16 g 6   furosemide (LASIX) 20 MG tablet Take 1 tablet (20 mg total) by mouth as needed for edema or fluid. 90 tablet 3   loratadine (CLARITIN) 10 MG tablet Take 10 mg by mouth daily.       methimazole (TAPAZOLE) 5 MG tablet Take 1 tablet (5 mg total) by mouth 3 (three) times daily. 90 tablet 0   metoprolol succinate (TOPROL-XL) 100 MG 24 hr tablet Take 1 tablet (100 mg total) by mouth at bedtime. Take with or immediately following a meal. 30 tablet 3   moxifloxacin (VIGAMOX) 0.5 % ophthalmic solution Place 1 drop into the left eye 4 times daily.     neomycin-polymyxin b-dexamethasone (MAXITROL) 3.5-10000-0.1 OINT Place into the left eye every 4 (four) hours.     pantoprazole (PROTONIX) 40 MG tablet TAKE 1 TABLET (40 MG TOTAL) BY MOUTH DAILY. 90 tablet 4   Polyethyl Glycol-Propyl Glycol 0.4-0.3 % SOLN Place 2 drops into the left eye every 2 (two) hours.     prednisoLONE acetate (PRED FORTE) 1 % ophthalmic suspension Place 1 drop into the left eye 2 (two) times daily.     Probiotic Product (PROBIOTIC PO) Take 1 capsule by mouth daily.     valACYclovir (VALTREX) 1000 MG tablet Take 1 tablet by mouth in the morning, at noon, and at bedtime.     vitamin E 180 MG (400 UNITS) capsule Take 400 Units by mouth daily.     ZINC CITRATE PO Take 22 mg by mouth daily.     metoprolol succinate (TOPROL XL) 25 MG 24 hr tablet Take 1 tablet (25 mg total) by mouth daily. (Patient not taking: Reported on 08/14/2021)     spironolactone (ALDACTONE) 25 MG tablet Take 0.5 tablets (12.5 mg total) by mouth daily. (Patient not taking: Reported on 08/14/2021) 15 tablet 11   No current facility-administered medications for this encounter.    Allergies  Allergen Reactions   Erythromycin     Upset stomach   Nsaids Other (See Comments)    Has a history of bleeding ulcers, tolerates aspirin    Penicillins Hives and Shortness Of Breath    Did it involve swelling of the face/tongue/throat, SOB, or low BP?  Yes Did it involve sudden or severe rash/hives, skin peeling, or any reaction on the inside of your mouth or nose? No Did you need to seek medical attention at a hospital or doctor's office? No When did it last  happen?      45 years If all above answers are "NO", may proceed with cephalosporin use.     Social History   Socioeconomic History   Marital status: Divorced    Spouse name: Not on file   Number of children: 2   Years of education: 12th grade   Highest education level: High school graduate  Occupational History   Occupation: Retired,  Tobacco Use   Smoking status: Never   Smokeless tobacco: Never   Tobacco comments:    Never smoke 07/16/21  Vaping Use   Vaping Use: Never used  Substance and Sexual Activity   Alcohol use: Never    Alcohol/week: 0.0 standard drinks of alcohol   Drug use: No   Sexual activity: Yes    Birth control/protection: Post-menopausal    Comment: 1st intercourse 71 yo-Fewer than 5 partners  Other Topics Concern   Not on file  Social History Narrative   Lives along with her dog. She enjoys croteching, crafts, making wreaths, reading and gardening in her free time.    Social Determinants of Health   Financial Resource Strain: Low Risk  (03/26/2021)   Overall Financial Resource Strain (CARDIA)    Difficulty of Paying Living Expenses: Not hard at all  Food Insecurity: No Food Insecurity (03/26/2021)   Hunger Vital Sign    Worried About Running Out of Food in the Last Year: Never true    Ran Out of Food in the Last Year: Never true  Transportation Needs: No Transportation Needs (03/26/2021)   PRAPARE - Hydrologist (Medical): No    Lack of Transportation (Non-Medical): No  Physical Activity: Sufficiently Active (03/26/2021)   Exercise Vital Sign    Days of Exercise per Week: 3 days    Minutes of Exercise per Session: 120 min  Stress: No Stress Concern Present (03/26/2021)   [place]    Feeling of Stress : Not at all  Social Connections: Moderately Isolated (03/26/2021)   Social Connection and Isolation Panel [NHANES]    Frequency of Communication with  Friends and Family: More than three times a week    Frequency of Social Gatherings with Friends and Family: More than three times a week    Attends Religious Services: More than 4 times per year    Active Member of [employer] or Organizations: No    Attends Archivist Meetings: Never    Marital Status: Divorced  Human resources officer Violence: Not At Risk (03/26/2021)   Humiliation, Afraid, Rape, and Kick questionnaire    Fear of Current or Ex-Partner: No    Emotionally Abused: No    Physically Abused: No    Sexually Abused: No    Family History  Problem Relation Age of Onset   Cancer Father        prostate   Breast cancer Paternal Aunt 25   Diabetes Maternal Grandmother    Heart disease Maternal Grandmother    Heart disease Maternal Grandfather    Melanoma Paternal Aunt    Neuropathy Mother    Congestive Heart Failure Mother    Thyroid disease Daughter     ROS- All systems are reviewed and negative except as per the  HPI above  Physical Exam: Vitals:   08/14/21 1420  BP: 132/78  Pulse: 75  Weight: 75.4 kg  Height: '5\' 4"'$  (1.626 m)    Wt Readings from Last 3 Encounters:  08/14/21 75.4 kg  07/31/21 73.9 kg  07/24/21 73.6 kg    Labs: Lab Results  Component Value Date   NA 136 07/24/2021   K 5.1 07/24/2021   CL 104 07/24/2021   CO2 21 (L) 07/24/2021   GLUCOSE 104 (H) 07/24/2021   BUN 21 07/24/2021   CREATININE 1.46 (H) 07/24/2021   CALCIUM 10.0 07/24/2021   PHOS 3.1 07/05/2021   MG 2.3 07/10/2021   Lab Results  Component Value Date   INR 1.1 12/25/2018   Lab Results  Component Value Date   CHOL 179 08/07/2020   HDL 46 (L) 08/07/2020   LDLCALC 80 08/07/2020   TRIG 503 (H) 07/05/2021     GEN- The patient is a well appearing female, alert and oriented x 3 today.   HEENT-head normocephalic, atraumatic, sclera clear, conjunctiva pink, hearing intact, trachea midline. Lungs- Clear to ausculation bilaterally, normal work of breathing Heart- regular rate  and rhythm, no murmurs, rubs or gallops  GI- soft, NT, ND, + BS Extremities- no clubbing, cyanosis, or edema MS- no significant deformity or atrophy Skin- no rash or lesion Psych- euthymic mood, full affect Neuro- strength and sensation are intact   EKG- Vent. rate 75 BPM PR interval 194 ms QRS duration 96 ms QT/QTcB 432/482 ms P-R-T axes 19 14 -8 Normal sinus rhythm Low voltage QRS Nonspecific T wave abnormality Abnormal ECG When compared with ECG of 24-Jul-2021 12:02, PREVIOUS ECG IS PRESENT    Echo 07/04/21  1. Left ventricular ejection fraction, by estimation, is 50 to 55%. The  left ventricle has low normal function. The left ventricle has no regional  wall motion abnormalities.   2. Right ventricular systolic function is moderately reduced. The right  ventricular size is mildly enlarged. There is normal pulmonary artery  systolic pressure. The estimated right ventricular systolic pressure is  38.1 mmHg.   3. A small pericardial effusion is present. The pericardial effusion is  surrounding the apex and localized near the right ventricle. There is no  evidence of cardiac tamponade.   4. PISA 0.2 cm. The mitral valve is normal in structure. Mild to moderate mitral valve regurgitation. No evidence of mitral stenosis.   5. Tricuspid valve regurgitation is mild to moderate.   6. The aortic valve is normal in structure. Aortic valve regurgitation is  not visualized. No aortic stenosis is present.   7. The inferior vena cava is dilated in size with >50% respiratory  variability, suggesting right atrial pressure of 8 mmHg.   Comparison(s): Prior images reviewed side by side. RV size in increased and function reduced when compared to 01/25/2021.     Assessment and Plan: 1. Persistent atrial fibrillation/atrial flutter S/p ablation 02/11/19  Recent afib with RVR/CHF  with respiratory failure/intubationin July  Started on amiodarone load at time of hospitalization and now is on  200 mg daily Converted to SR over the weekend Continue on 100 mg toprol for now   Patient has an appointment with Dr Curt Bears to have redo ablation 11/17 Continue Eliquis 5 mg BID  2. CHA2DS2VASc score of at least 3 Continue eliquis 5 mg bid   3. MVP Mild-mod MR  4. Chronic diastolic CHF Appears euvolemic today. Followed in [facility] today   5. HTN Stable, recent hypotension over  the weekend, seems to have stabilized since back in SR Continue  to monitor at home   6. Hyperthyroidism  States thyroid studies improved with PCP on recent check Continue tapazole 5 mg tid    Follow up with [facility] and with Dr Curt Bears as scheduled.    Geroge Baseman Deionna Marcantonio, [facility] [address] [place], [place] [address] [phone]

## 2021-08-17 DIAGNOSIS — H209 Unspecified iridocyclitis: Secondary | ICD-10-CM | POA: Diagnosis not present

## 2021-08-17 NOTE — Telephone Encounter (Signed)
Pt was seen by the A-fib clinic this week and is feeling better.

## 2021-08-20 MED FILL — Medication: Qty: 1 | Status: AC

## 2021-08-22 ENCOUNTER — Ambulatory Visit (HOSPITAL_COMMUNITY)
Admission: RE | Admit: 2021-08-22 | Discharge: 2021-08-22 | Disposition: A | Discharge status: Home / Self Care | Payer: PPO | Source: Ambulatory Visit | Attending: Nurse Practitioner | Admitting: Nurse Practitioner

## 2021-08-22 DIAGNOSIS — I4819 Other persistent atrial fibrillation: Secondary | ICD-10-CM | POA: Insufficient documentation

## 2021-08-22 LAB — BASIC METABOLIC PANEL
Anion gap: 5 (ref 5–15)
BUN: 12 mg/dL (ref 8–23)
CO2: 27 mmol/L (ref 22–32)
Calcium: 9.5 mg/dL (ref 8.9–10.3)
Chloride: 109 mmol/L (ref 98–111)
Creatinine, Ser: 1.05 mg/dL — ABNORMAL HIGH (ref 0.44–1.00)
GFR, Estimated: 56 mL/min — ABNORMAL LOW (ref >60)
Glucose, Bld: 81 mg/dL (ref 70–99)
Potassium: 3.7 mmol/L (ref 3.5–5.1)
Sodium: 141 mmol/L (ref 135–145)

## 2021-08-23 DIAGNOSIS — H209 Unspecified iridocyclitis: Secondary | ICD-10-CM | POA: Diagnosis not present

## 2021-08-24 DIAGNOSIS — B6013 Keratoconjunctivitis due to Acanthamoeba: Secondary | ICD-10-CM | POA: Diagnosis not present

## 2021-08-27 ENCOUNTER — Other Ambulatory Visit (HOSPITAL_COMMUNITY): Payer: Self-pay | Admitting: Family Medicine

## 2021-08-29 ENCOUNTER — Other Ambulatory Visit: Payer: Self-pay

## 2021-08-29 ENCOUNTER — Ambulatory Visit: Payer: PPO | Admitting: Family Medicine

## 2021-08-29 DIAGNOSIS — L718 Other rosacea: Secondary | ICD-10-CM | POA: Diagnosis not present

## 2021-08-29 DIAGNOSIS — B6013 Keratoconjunctivitis due to Acanthamoeba: Secondary | ICD-10-CM | POA: Diagnosis not present

## 2021-08-29 NOTE — Telephone Encounter (Signed)
Patient called and left a message on triage for a refill on Methimazole. This was last filled by another provider. Last filled 07/26/21 follow up appointment 09/12/2021.

## 2021-08-29 NOTE — Telephone Encounter (Signed)
Refill sent.  When is her appt with endocrinology?

## 2021-08-29 NOTE — Telephone Encounter (Signed)
Opened in error

## 2021-08-31 DIAGNOSIS — B6013 Keratoconjunctivitis due to Acanthamoeba: Secondary | ICD-10-CM | POA: Diagnosis not present

## 2021-08-31 NOTE — Telephone Encounter (Signed)
Attempted to contact the patient. No answer, left a detailed vm msg regarding the reason of the call. Patient notify to return a call back to the clinic. Direct call back info provided.

## 2021-09-03 DIAGNOSIS — B6013 Keratoconjunctivitis due to Acanthamoeba: Secondary | ICD-10-CM | POA: Diagnosis not present

## 2021-09-04 ENCOUNTER — Telehealth: Payer: PPO

## 2021-09-07 DIAGNOSIS — B6013 Keratoconjunctivitis due to Acanthamoeba: Secondary | ICD-10-CM | POA: Diagnosis not present

## 2021-09-07 DIAGNOSIS — L718 Other rosacea: Secondary | ICD-10-CM | POA: Diagnosis not present

## 2021-09-12 ENCOUNTER — Encounter: Payer: Self-pay | Admitting: Family Medicine

## 2021-09-12 ENCOUNTER — Ambulatory Visit (INDEPENDENT_AMBULATORY_CARE_PROVIDER_SITE_OTHER): Payer: PPO | Admitting: Family Medicine

## 2021-09-12 VITALS — BP 144/92 | HR 91 | Ht 64.0 in | Wt 171.0 lb

## 2021-09-12 DIAGNOSIS — I5032 Chronic diastolic (congestive) heart failure: Secondary | ICD-10-CM | POA: Diagnosis not present

## 2021-09-12 DIAGNOSIS — E0590 Thyrotoxicosis, unspecified without thyrotoxic crisis or storm: Secondary | ICD-10-CM | POA: Diagnosis not present

## 2021-09-12 DIAGNOSIS — I10 Essential (primary) hypertension: Secondary | ICD-10-CM

## 2021-09-12 DIAGNOSIS — I4819 Other persistent atrial fibrillation: Secondary | ICD-10-CM | POA: Diagnosis not present

## 2021-09-12 DIAGNOSIS — E042 Nontoxic multinodular goiter: Secondary | ICD-10-CM | POA: Insufficient documentation

## 2021-09-12 DIAGNOSIS — I4891 Unspecified atrial fibrillation: Secondary | ICD-10-CM | POA: Diagnosis not present

## 2021-09-12 DIAGNOSIS — B6013 Keratoconjunctivitis due to Acanthamoeba: Secondary | ICD-10-CM

## 2021-09-12 DIAGNOSIS — D6869 Other thrombophilia: Secondary | ICD-10-CM

## 2021-09-12 NOTE — Progress Notes (Signed)
ADVANCED HF CLINIC NOTE  Primary Care: [name], DO HF Cardiologist: Dr. Aundra Dubin  HPI: Anna Marquez is a 71 y.o.with history of diastolic CHF, paroxysmal atrial fibrillation, and recently diagnosed hyperthyroidism.  She was was admitted with atrial fibrillation/RVR and acute on chronic diastolic CHF. Patient was on Tikosyn several years ago for AF then had ablation in 2/21 by Dr. Rayann Heman.  Tikosyn was later stopped.  Recently, she has re-developed episodes of atrial fibrillation.  She also, of noted, has been found to have hyperthyroidism likely related to multinodular goiter.  She was admitted in 5/23 with acute respiratory failure in setting of atrial fibrillation and CHF, there was some question of PNA.  She spontaneously converted to NSR.  At office visit subsequently on 06/19/21, she remained in NSR.     She was admitted  07/03/21 with atrial fibrillation with RVR and developed progressive respiratory distress. Amiodarone gtt was started and she was given Lasix, but ultimately required intubation. She developed hypotension and required NE + vasopressin with elevated lactate, and AKI. TEE showed normal LV size with EF 60-65%, mild RV dysfunction, severe LAE with moderate-severe central MR (likely atrial functional MR), no LA appendage thrombus. DCCV was attempted x 2, both times she went back into NSR but promptly reverted to AF. Eventual successful DCCV to NSR on 07/09/21. Drips weaned off, and started on oral amiodarone taper. GDMT started and she was discharged home, weight 162 lbs.  Follow up in AF clinic 07/16/21, HR 123 in Afib. Felt AF paroxysmal and needed more time for amiodarone load. Toprol started.   Today she returns for post hospital HF follow up. She overall feels weak. Feels palpitations. She has some dyspnea walking around her house and with ADLs, but feels limited mostly by weakness from her hospitalization. Denies abnormal bleeding, CP, dizziness, edema, or PND/Orthopnea.  Appetite ok. No fever or chills. Weight at home 162-165 pounds. Taking all medications.   Today she returns for HF follow up. Overall feeling fine. She has ablation scheduled in 2 months with Dr. Curt Bears. She participates in walking classes three days a week and has no dyspnea with this. Takes Lasix every 2-3 weeks. Denies palpitations, CP, dizziness, edema, or PND/Orthopnea. She sleeps reclined due to GERD. Appetite ok. No fever or chills. Weight at home 169 pounds. Taking all medications. BP at home 90-100s/70s. Seen at [facility] for eye infection, on gabapentin and Valtrex.  ECG (personally reviewed): none ordered today.  Labs (7/23): K 4.8, creatinine 1.47, normal LFTs Labs (8/23): K 3.7, creatinine 1.05  Cardiac Studies:  - TEE (6/23): normal LV size with EF 60-65%, mild RV dysfunction, severe LAE with moderate-severe central MR (likely atrial functional MR), no LA appendage thrombus.   Past Medical History:  Diagnosis Date   Adenomatous colon polyp 2003   Cholelithiasis    COVID    Diverticulosis of colon    Gastric polyps    GERD (gastroesophageal reflux disease)    Helicobacter pylori (H. pylori)    Hiatal hernia    Hypertension    Mitral valve prolapse    Nephrolithiasis    Osteoarthritis    Osteopenia 03/2017   T score -1.5 FRAX 8.9% / 0.9%.  Stable from prior DEXA 2016   Persistent atrial fibrillation (HCC)    TGA (transient global amnesia)    Current Outpatient Medications  Medication Sig Dispense Refill   acetaminophen (TYLENOL) 500 MG tablet Take 1,000 mg by mouth every 6 (six) hours as needed for moderate pain  or headache.     albuterol (VENTOLIN HFA) 108 (90 Base) MCG/ACT inhaler INHALE 1 TO 2 PUFFS INTO THE LUNGS EVERY 6 HOURS AS NEEDED FOR WHEEZING OR SHORTNESS OF BREATH. 8.5 g 3   amiodarone (PACERONE) 200 MG tablet Take 1 tablet (200 mg total) by mouth daily. 30 tablet 3   Ascorbic Acid (VITAMIN C) 1000 MG tablet Take 1,000 mg by mouth daily.      chlorpheniramine-HYDROcodone (TUSSIONEX PENNKINETIC ER) 10-8 MG/5ML Take 5 mLs by mouth every 12 (twelve) hours as needed for cough. 115 mL 0   Cholecalciferol (VITAMIN D) 50 MCG (2000 UT) tablet Take 2,000 Units by mouth daily.     dapagliflozin propanediol (FARXIGA) 10 MG TABS tablet Take 1 tablet (10 mg total) by mouth daily. 30 tablet 5   diclofenac Sodium (VOLTAREN) 1 % GEL Apply 2-4 g topically 4 (four) times daily as needed (knees,ankles and hand pain).     ELIQUIS 5 MG TABS tablet TAKE 1 TABLET BY MOUTH 2 TIMES DAILY. 60 tablet 5   ferrous sulfate 325 (65 FE) MG tablet Take 325 mg by mouth every other day.     fluticasone (FLONASE) 50 MCG/ACT nasal spray PLACE 1 SPRAY INTO BOTH NOSTRILS AS NEEDED FOR ALLERGIES OR RHINITIS. 16 g 6   furosemide (LASIX) 20 MG tablet Take 1 tablet (20 mg total) by mouth as needed for edema or fluid. 90 tablet 3   loratadine (CLARITIN) 10 MG tablet Take 10 mg by mouth daily.      methimazole (TAPAZOLE) 5 MG tablet TAKE 1 TABLET BY MOUTH 3 TIMES DAILY. (REFILLS TO COME FROM PRIMARY CARE PROVIDER) 90 tablet 0   metoprolol succinate (TOPROL-XL) 100 MG 24 hr tablet Take 1 tablet (100 mg total) by mouth at bedtime. Take with or immediately following a meal. 30 tablet 3   moxifloxacin (VIGAMOX) 0.5 % ophthalmic solution Place 1 drop into the left eye 4 times daily.     neomycin-bacitracin-polymyxin (NEOSPORIN) ophthalmic ointment Apply to eye 4 (four) times daily.     neomycin-polymyxin b-dexamethasone (MAXITROL) 3.5-10000-0.1 OINT Place into the left eye every 4 (four) hours.     pantoprazole (PROTONIX) 40 MG tablet TAKE 1 TABLET (40 MG TOTAL) BY MOUTH DAILY. 90 tablet 4   Polyethyl Glycol-Propyl Glycol 0.4-0.3 % SOLN Place 2 drops into the left eye every 2 (two) hours.     Probiotic Product (PROBIOTIC PO) Take 1 capsule by mouth daily.     spironolactone (ALDACTONE) 25 MG tablet Take 0.5 tablets (12.5 mg total) by mouth daily. 15 tablet 11   valACYclovir (VALTREX)  1000 MG tablet Take 1,000 mg by mouth every morning.     vitamin E 180 MG (400 UNITS) capsule Take 400 Units by mouth daily.     ZINC CITRATE PO Take 22 mg by mouth daily.     No current facility-administered medications for this encounter.   Allergies  Allergen Reactions   Erythromycin     Upset stomach   Nsaids Other (See Comments)    Has a history of bleeding ulcers, tolerates aspirin    Penicillins Hives and Shortness Of Breath    Did it involve swelling of the face/tongue/throat, SOB, or low BP? Yes Did it involve sudden or severe rash/hives, skin peeling, or any reaction on the inside of your mouth or nose? No Did you need to seek medical attention at a hospital or doctor's office? No When did it last happen?      45 years  If all above answers are "NO", may proceed with cephalosporin use.    Social History   Socioeconomic History   Marital status: Divorced    Spouse name: Not on file   Number of children: 2   Years of education: 12th grade   Highest education level: High school graduate  Occupational History   Occupation: Retired,  Tobacco Use   Smoking status: Never   Smokeless tobacco: Never   Tobacco comments:    Never smoke 07/16/21  Vaping Use   Vaping Use: Never used  Substance and Sexual Activity   Alcohol use: Never    Alcohol/week: 0.0 standard drinks of alcohol   Drug use: No   Sexual activity: Yes    Birth control/protection: Post-menopausal    Comment: 1st intercourse 71 yo-Fewer than 5 partners  Other Topics Concern   Not on file  Social History Narrative   Lives along with her dog. She enjoys croteching, crafts, making wreaths, reading and gardening in her free time.    Social Determinants of Health   Financial Resource Strain: Low Risk  (03/26/2021)   Overall Financial Resource Strain (CARDIA)    Difficulty of Paying Living Expenses: Not hard at all  Food Insecurity: No Food Insecurity (03/26/2021)   Hunger Vital Sign    Worried About Running  Out of Food in the Last Year: Never true    Ran Out of Food in the Last Year: Never true  Transportation Needs: No Transportation Needs (03/26/2021)   PRAPARE - Hydrologist (Medical): No    Lack of Transportation (Non-Medical): No  Physical Activity: Sufficiently Active (03/26/2021)   Exercise Vital Sign    Days of Exercise per Week: 3 days    Minutes of Exercise per Session: 120 min  Stress: No Stress Concern Present (03/26/2021)   [place]    Feeling of Stress : Not at all  Social Connections: Moderately Isolated (03/26/2021)   Social Connection and Isolation Panel [NHANES]    Frequency of Communication with Friends and Family: More than three times a week    Frequency of Social Gatherings with Friends and Family: More than three times a week    Attends Religious Services: More than 4 times per year    Active Member of [employer] or Organizations: No    Attends Archivist Meetings: Never    Marital Status: Divorced  Human resources officer Violence: Not At Risk (03/26/2021)   Humiliation, Afraid, Rape, and Kick questionnaire    Fear of Current or Ex-Partner: No    Emotionally Abused: No    Physically Abused: No    Sexually Abused: No   Family History  Problem Relation Age of Onset   Cancer Father        prostate   Breast cancer Paternal Aunt 27   Diabetes Maternal Grandmother    Heart disease Maternal Grandmother    Heart disease Maternal Grandfather    Melanoma Paternal Aunt    Neuropathy Mother    Congestive Heart Failure Mother    Thyroid disease Daughter    BP 120/78   Pulse 75   Wt 76.7 kg (169 lb)   SpO2 97%   BMI 29.01 kg/m   Wt Readings from Last 3 Encounters:  09/13/21 76.7 kg (169 lb)  09/12/21 77.6 kg (171 lb)  08/14/21 75.4 kg (166 lb 3.2 oz)   PHYSICAL EXAM: General:  NAD. No resp difficulty HEENT: Normal Neck:  Supple. No JVD. Carotids 2+ bilat; no bruits.  No lymphadenopathy or thryomegaly appreciated. Cor: PMI nondisplaced. Regular rate & rhythm. No rubs, gallops or murmurs. Lungs: Clear Abdomen: Soft, nontender, nondistended. No hepatosplenomegaly. No bruits or masses. Good bowel sounds. Extremities: No cyanosis, clubbing, rash, edema Neuro: Alert & oriented x 3, cranial nerves grossly intact. Moves all 4 extremities w/o difficulty. Affect pleasant.  ASSESSMENT & PLAN: 1. Chronic diastolic CHF: TEE on 06/10/52 showed normal LV size with EF 60-65%, mild RV dysfunction, severe LAE with moderate-severe central MR (likely atrial functional MR). Suspect CHF exacerbation was triggered by atrial fibrillation, also mitral regurgitation may have been worsened by atrial fibrillation.  She had a recent prior admission with CHF in the setting of atrial fibrillation, consistent with flash pulmonary edema. NYHA II today, she is not volume overloaded on exam.  - Continue spironolactone 12.5 mg daily. Will not increase with home BPs 90-100s. Recent labs looks ok, SCr 1.05, K 3.7 - Continue Toprol XL 100 mg daily. (Systolic BP dropped to 09W with increase to 125 mg daily) - Continue Farxiga 10 mg daily.  - Continue Lasix 20 mg PRN. - Need to keep her in NSR, decompensates in AF.  - Ischemia as a contributor to flash pulmonary edema is possible though less likely. Would consider LHC/RHC in the future, not urgent.  2. Atrial fibrillation: Persistent.  She was on Tikosyn in the past then had AF ablation in 2/21, Tikosyn stopped after this.  She was not on an antiarrhythmic prior to admission.  Has had increased frequency of AF recently, was admitted in 5/23 with AF and CHF exacerbation. She has also been noted to have developed hyperthyroidism prior to this admission, this can help trigger AF. Need to keep in NSR due to worsening of HF in AF.  DCCV 07/05/21 failed, but she was still on low dose norepinephrine. DCCV 07/09/21 successful. Amiodarone is not an ideal medication  for her (she already has hyperthyroidism).  Would like to avoid this long-term. She has re-do atrial fibrillation ablation scheduled with Dr. Curt Bears 11/23/21. Hopefully this will allow her to safely stop amiodarone. - Continue amiodarone.  - Continue Eliquis 5 mg bid. No bleeding issues. 3. H/o Shock: Patient developed shock after intubation requiring vasopressin and NE.  This may have been related to sedation.  No localizing signs for infection.  Antibiotics were not started. Do not think she had septic shock.   - Resolved. 4. H/o AKI: In setting of shock. Now resolved. 5. Hyperthyroidism: She had this prior to admission (prior to amiodarone use).  Has multinodular goiter. Low TSH with elevated free T4, but free T3 low this admission.   - Continue methimazole. PCP now following. - As above, amiodarone is not ideal but think necessary for the time being.  6. ? 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 They know she is on amiodarone. She is on Valtrex, chlorhexidine and eye drops. No visual disturbances today.  Follow up in 2-3 months with Dr. Aundra Dubin.  Allena Katz, FNP-BC 09/13/21

## 2021-09-13 ENCOUNTER — Encounter (HOSPITAL_COMMUNITY): Payer: Self-pay

## 2021-09-13 ENCOUNTER — Ambulatory Visit (HOSPITAL_COMMUNITY)
Admission: RE | Admit: 2021-09-13 | Discharge: 2021-09-13 | Disposition: A | Discharge status: Home / Self Care | Payer: PPO | Source: Ambulatory Visit | Attending: Family Medicine | Admitting: Family Medicine

## 2021-09-13 VITALS — BP 120/78 | HR 75 | Wt 169.0 lb

## 2021-09-13 DIAGNOSIS — I4819 Other persistent atrial fibrillation: Secondary | ICD-10-CM | POA: Diagnosis not present

## 2021-09-13 DIAGNOSIS — E0520 Thyrotoxicosis with toxic multinodular goiter without thyrotoxic crisis or storm: Secondary | ICD-10-CM | POA: Diagnosis not present

## 2021-09-13 DIAGNOSIS — Z79624 Long term (current) use of inhibitors of nucleotide synthesis: Secondary | ICD-10-CM | POA: Insufficient documentation

## 2021-09-13 DIAGNOSIS — R002 Palpitations: Secondary | ICD-10-CM | POA: Diagnosis not present

## 2021-09-13 DIAGNOSIS — E0590 Thyrotoxicosis, unspecified without thyrotoxic crisis or storm: Secondary | ICD-10-CM

## 2021-09-13 DIAGNOSIS — K219 Gastro-esophageal reflux disease without esophagitis: Secondary | ICD-10-CM | POA: Diagnosis not present

## 2021-09-13 DIAGNOSIS — B6013 Keratoconjunctivitis due to Acanthamoeba: Secondary | ICD-10-CM | POA: Diagnosis not present

## 2021-09-13 DIAGNOSIS — Z7901 Long term (current) use of anticoagulants: Secondary | ICD-10-CM | POA: Insufficient documentation

## 2021-09-13 DIAGNOSIS — I5033 Acute on chronic diastolic (congestive) heart failure: Secondary | ICD-10-CM | POA: Insufficient documentation

## 2021-09-13 DIAGNOSIS — I5032 Chronic diastolic (congestive) heart failure: Secondary | ICD-10-CM

## 2021-09-13 DIAGNOSIS — I110 Hypertensive heart disease with heart failure: Secondary | ICD-10-CM | POA: Insufficient documentation

## 2021-09-13 DIAGNOSIS — Z7984 Long term (current) use of oral hypoglycemic drugs: Secondary | ICD-10-CM | POA: Diagnosis not present

## 2021-09-13 LAB — T4, FREE: Free T4: 1.2 ng/dL (ref 0.8–1.8)

## 2021-09-13 LAB — TSH: TSH: 4.58 mIU/L — ABNORMAL HIGH (ref 0.40–4.50)

## 2021-09-13 LAB — T3: T3, Total: 86 ng/dL (ref 76–181)

## 2021-09-13 NOTE — Patient Instructions (Signed)
Thank you for coming in today  No labs   Your physician recommends that you schedule a follow-up appointment in:  3 months with [facility]    Do the following things EVERYDAY: Weigh yourself in the morning before breakfast. Write it down and keep it in a log. Take your medicines as prescribed Eat low salt foods--Limit salt (sodium) to 2000 mg per day.  Stay as active as you can everyday Limit all fluids for the day to less than 2 liters  At the [facility], you and your health needs are our priority. As part of our continuing mission to provide you with exceptional heart care, we have created designated Provider Care Teams. These Care Teams include your primary Cardiologist (physician) and Advanced Practice Providers (APPs- Physician Assistants and Nurse Practitioners) who all work together to provide you with the care you need, when you need it.   You may see any of the following providers on your designated Care Team at your next follow up: Dr Glori Bickers Dr [name] Dr. Roxana Hires, NP Lyda Jester, [place] [facility] [place], [place] Forestine Na, NP Audry Riles, PharmD   Please be sure to bring in all your medications bottles to every appointment.   If you have any questions or concerns before your next appointment please send [place] a message through [place] or call our office at [phone].    TO LEAVE A MESSAGE FOR THE NURSE SELECT OPTION 2, PLEASE LEAVE A MESSAGE INCLUDING: YOUR NAME DATE OF BIRTH CALL BACK NUMBER REASON FOR CALL**this is important as we prioritize the call backs  YOU WILL RECEIVE A CALL BACK THE SAME DAY AS LONG AS YOU CALL BEFORE 4:00 PM

## 2021-09-14 DIAGNOSIS — B6013 Keratoconjunctivitis due to Acanthamoeba: Secondary | ICD-10-CM | POA: Diagnosis not present

## 2021-09-14 DIAGNOSIS — L718 Other rosacea: Secondary | ICD-10-CM | POA: Diagnosis not present

## 2021-09-16 DIAGNOSIS — B6013 Keratoconjunctivitis due to Acanthamoeba: Secondary | ICD-10-CM | POA: Insufficient documentation

## 2021-09-16 NOTE — Assessment & Plan Note (Signed)
She is seeing both cardiology and electrophysiology.  Planning to have ablation in November.  Stable symptoms at this time.

## 2021-09-16 NOTE — Assessment & Plan Note (Signed)
Remains on Eliquis for anticoagulation

## 2021-09-16 NOTE — Assessment & Plan Note (Signed)
Under the care of ophthalmology at [facility].  Stable at this time.

## 2021-09-16 NOTE — Progress Notes (Signed)
Anna Marquez - 71 y.o. female MRN [id number]  Date of birth: 28-Aug-1950  Subjective Chief Complaint  Patient presents with   Hospitalization Follow-up    HPI Anna Marquez is a 71 year old female here today for follow-up visit.  She has had difficulty with her A-fib over the past few months which may be exacerbated by her hyperthyroidism.  She did see endocrinology earlier today.  Updated labs ordered at her endocrinologist as well.  He may reduce her methimazole some depending on the results of her labs.  Additionally, she has met with Dr. Curt Bears who is planning on doing an ablation for her A-fib.  This is not until November.  She denies any new symptoms of chest pain, increasing shortness of breath, increased swelling.  Stable with current medications.  She has been seeing ophthalmology as well for recurrent eye issues.  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  She has home a few drops for treatment of this.  .ROS:  A comprehensive ROS was completed and negative except as noted per HPI  Allergies  Allergen Reactions   Erythromycin     Upset stomach   Nsaids Other (See Comments)    Has a history of bleeding ulcers, tolerates aspirin    Penicillins Hives and Shortness Of Breath    Did it involve swelling of the face/tongue/throat, SOB, or low BP? Yes Did it involve sudden or severe rash/hives, skin peeling, or any reaction on the inside of your mouth or nose? No Did you need to seek medical attention at a hospital or doctor's office? No When did it last happen?      45 years If all above answers are "NO", may proceed with cephalosporin use.     Past Medical History:  Diagnosis Date   Adenomatous colon polyp 2003   Cholelithiasis    COVID    Diverticulosis of colon    Gastric polyps    GERD (gastroesophageal reflux disease)    Helicobacter pylori (H. pylori)    Hiatal hernia    Hypertension    Mitral valve prolapse    Nephrolithiasis    Osteoarthritis    Osteopenia  03/2017   T score -1.5 FRAX 8.9% / 0.9%.  Stable from prior DEXA 2016   Persistent atrial fibrillation (HCC)    TGA (transient global amnesia)     Past Surgical History:  Procedure Laterality Date   ANTERIOR AND POSTERIOR REPAIR N/A 06/14/2014   Procedure: ANTERIOR (CYSTOCELE) AND POSTERIOR REPAIR (RECTOCELE);  Surgeon: Anastasio Auerbach, MD;  Location: [facility];  Service: Gynecology;  Laterality: N/A;   ATRIAL FIBRILLATION ABLATION N/A 02/11/2019   Procedure: ATRIAL FIBRILLATION ABLATION;  Surgeon: Thompson Grayer, MD;  Location: [facility];  Service: Cardiovascular;  Laterality: N/A;   CARDIOVERSION N/A 01/15/2019   Procedure: CARDIOVERSION;  Surgeon: Fay Records, MD;  Location: [facility];  Service: Cardiovascular;  Laterality: N/A;   CARDIOVERSION N/A 07/09/2021   Procedure: CARDIOVERSION;  Surgeon: Larey Dresser, MD;  Location: [facility];  Service: Cardiovascular;  Laterality: N/A;   CHOLECYSTECTOMY     kidney stone removal     x2   VAGINAL HYSTERECTOMY N/A 06/14/2014   Procedure: HYSTERECTOMY VAGINAL;  Surgeon: Anastasio Auerbach, MD;  Location: [facility];  Service: Gynecology;  Laterality: N/A;    Social History   Socioeconomic History   Marital status: Divorced    Spouse name: Not on file   Number of children: 2   Years of education: 12th grade  Highest education level: High school graduate  Occupational History   Occupation: Retired,  Tobacco Use   Smoking status: Never   Smokeless tobacco: Never   Tobacco comments:    Never smoke 07/16/21  Vaping Use   Vaping Use: Never used  Substance and Sexual Activity   Alcohol use: Never    Alcohol/week: 0.0 standard drinks of alcohol   Drug use: No   Sexual activity: Yes    Birth control/protection: Post-menopausal    Comment: 1st intercourse 71 yo-Fewer than 5 partners  Other Topics Concern   Not on file  Social History Narrative   Lives along with her dog. She enjoys croteching, crafts, making wreaths, reading  and gardening in her free time.    Social Determinants of Health   Financial Resource Strain: Low Risk  (03/26/2021)   Overall Financial Resource Strain (CARDIA)    Difficulty of Paying Living Expenses: Not hard at all  Food Insecurity: No Food Insecurity (03/26/2021)   Hunger Vital Sign    Worried About Running Out of Food in the Last Year: Never true    Ran Out of Food in the Last Year: Never true  Transportation Needs: No Transportation Needs (03/26/2021)   PRAPARE - Hydrologist (Medical): No    Lack of Transportation (Non-Medical): No  Physical Activity: Sufficiently Active (03/26/2021)   Exercise Vital Sign    Days of Exercise per Week: 3 days    Minutes of Exercise per Session: 120 min  Stress: No Stress Concern Present (03/26/2021)   [place]    Feeling of Stress : Not at all  Social Connections: Moderately Isolated (03/26/2021)   Social Connection and Isolation Panel [NHANES]    Frequency of Communication with Friends and Family: More than three times a week    Frequency of Social Gatherings with Friends and Family: More than three times a week    Attends Religious Services: More than 4 times per year    Active Member of Clubs or Organizations: No    Attends Archivist Meetings: Never    Marital Status: Divorced    Family History  Problem Relation Age of Onset   Cancer Father        prostate   Breast cancer Paternal Aunt 55   Diabetes Maternal Grandmother    Heart disease Maternal Grandmother    Heart disease Maternal Grandfather    Melanoma Paternal Aunt    Neuropathy Mother    Congestive Heart Failure Mother    Thyroid disease Daughter     Health Maintenance  Topic Date Due   COVID-19 Vaccine (3 - Pfizer risk series) 04/21/2019   Zoster Vaccines- Shingrix (1 of 2) 10/18/2021 (Originally 02/02/1969)   INFLUENZA VACCINE  04/07/2022 (Originally 08/07/2021)    MAMMOGRAM  07/27/2022   COLONOSCOPY (Pts 45-25yr Insurance coverage will need to be confirmed)  03/13/2023   TETANUS/TDAP  10/29/2027   Pneumonia Vaccine 71 Years old  Completed   DEXA SCAN  Completed   Hepatitis C Screening  Completed   HPV VACCINES  Aged Out     ----------------------------------------------------------------------------------------------------------------------------------------------------------------------------------------------------------------- Physical Exam BP (!) 144/92 (BP Location: Left Arm, Patient Position: Sitting, Cuff Size: Normal)   Pulse 91   Ht _0  (1.626 m)   Wt 171 lb (77.6 kg)   SpO2 97%   BMI 29.35 kg/m   Physical Exam Eyes:     General: No scleral icterus. Cardiovascular:  Rate and Rhythm: Rhythm irregular.  Pulmonary:     Effort: Pulmonary effort is normal.     Breath sounds: Normal breath sounds.  Musculoskeletal:     Cervical back: Neck supple.  Neurological:     Mental Status: She is alert.  Psychiatric:        Mood and Affect: Mood normal.        Behavior: Behavior normal.     ------------------------------------------------------------------------------------------------------------------------------------------------------------------------------------------------------------------- Assessment and Plan  Persistent atrial fibrillation [facility]) She is seeing both cardiology and electrophysiology.  Planning to have ablation in November.  Stable symptoms at this time.  Essential hypertension Mild elevation of blood pressure today.  Encouraged to monitor at home.  Chronic diastolic heart failure ([place]) Remains on Farxiga and Aldactone.  Furosemide as needed.  Followed by heart failure clinic.  Hyperthyroidism Currently under management of endocrinology.  She did have labs completed today.  Secondary hypercoagulable state ([place]) Remains on Eliquis for anticoagulation  Acanthamoeba keratitis Under the care of  ophthalmology at [facility].  Stable at this time.   No orders of the defined types were placed in this encounter.   Return in about 14 weeks (around 12/19/2021).    This visit occurred during the SARS-CoV-2 public health emergency.  Safety protocols were in place, including screening questions prior to the visit, additional usage of staff PPE, and extensive cleaning of exam room while observing appropriate contact time as indicated for disinfecting solutions.

## 2021-09-16 NOTE — Assessment & Plan Note (Signed)
Currently under management of endocrinology.  She did have labs completed today.

## 2021-09-16 NOTE — Assessment & Plan Note (Signed)
Mild elevation of blood pressure today.  Encouraged to monitor at home.

## 2021-09-16 NOTE — Assessment & Plan Note (Signed)
Remains on Farxiga and Aldactone.  Furosemide as needed.  Followed by heart failure clinic.

## 2021-10-01 ENCOUNTER — Ambulatory Visit (INDEPENDENT_AMBULATORY_CARE_PROVIDER_SITE_OTHER): Payer: PPO

## 2021-10-01 ENCOUNTER — Ambulatory Visit (INDEPENDENT_AMBULATORY_CARE_PROVIDER_SITE_OTHER): Payer: PPO | Admitting: Pulmonary Disease

## 2021-10-01 ENCOUNTER — Encounter: Payer: Self-pay | Admitting: Pulmonary Disease

## 2021-10-01 VITALS — BP 120/70 | HR 75 | Temp 98.3°F | Ht 64.0 in | Wt 173.0 lb

## 2021-10-01 DIAGNOSIS — R0602 Shortness of breath: Secondary | ICD-10-CM

## 2021-10-01 DIAGNOSIS — R0600 Dyspnea, unspecified: Secondary | ICD-10-CM | POA: Diagnosis not present

## 2021-10-01 DIAGNOSIS — K449 Diaphragmatic hernia without obstruction or gangrene: Secondary | ICD-10-CM | POA: Diagnosis not present

## 2021-10-01 NOTE — Patient Instructions (Signed)
I will see you 6 months from here  Schedule for breathing study Order chest x-ray for today  Call us with significant concerns  Continue graded activities as tolerated

## 2021-10-01 NOTE — Progress Notes (Signed)
Anna Marquez    [id number]    [birth year]  Primary Care Physician:Matthews, Einar Pheasant, DO  Referring Physician: [name], [address],  [place] [address]  Chief complaint:   Sinus congestion, sinus fullness  HPI:  Recent treatment in the hospital for pneumonia, atrial fibrillation, hemoptysis Hemoptysis resolved with steroids, bronchodilators, and course of Levaquin  She had used a course of azithromycin prior to the hospitalization  She still does have issues with atrial fibrillation for which she is following up with cardiology She is on Eliquis  Has had some nasal stuffiness and congestion She does use albuterol as needed  She does have a nebulizer at home which she has not had to use  She does use Flonase and Claritin for allergies  Breathing has progressively improved since she left the hospital  She does not feel limited with activities of daily living  No unusual shortness of breath  She is not back to usual activity level yet but is feeling generally better   Outpatient Encounter Medications as of 10/01/2021  Medication Sig  . acetaminophen (TYLENOL) 500 MG tablet Take 1,000 mg by mouth every 6 (six) hours as needed for moderate pain or headache.  . albuterol (VENTOLIN HFA) 108 (90 Base) MCG/ACT inhaler INHALE 1 TO 2 PUFFS INTO THE LUNGS EVERY 6 HOURS AS NEEDED FOR WHEEZING OR SHORTNESS OF BREATH.  [name] amiodarone (PACERONE) 200 MG tablet Take 1 tablet (200 mg total) by mouth daily.  . Ascorbic Acid (VITAMIN C) 1000 MG tablet Take 1,000 mg by mouth daily.  . chlorpheniramine-HYDROcodone (TUSSIONEX PENNKINETIC ER) 10-8 MG/5ML Take 5 mLs by mouth every 12 (twelve) hours as needed for cough.  . Cholecalciferol (VITAMIN D) 50 MCG (2000 UT) tablet Take 2,000 Units by mouth daily.  . dapagliflozin propanediol (FARXIGA) 10 MG TABS tablet Take 1 tablet (10 mg total) by mouth daily.  . diclofenac Sodium (VOLTAREN) 1 % GEL Apply  2-4 g topically 4 (four) times daily as needed (knees,ankles and hand pain).  [name] ELIQUIS 5 MG TABS tablet TAKE 1 TABLET BY MOUTH 2 TIMES DAILY.  . ferrous sulfate 325 (65 FE) MG tablet Take 325 mg by mouth every other day.  . fluticasone (FLONASE) 50 MCG/ACT nasal spray PLACE 1 SPRAY INTO BOTH NOSTRILS AS NEEDED FOR ALLERGIES OR RHINITIS.  . furosemide (LASIX) 20 MG tablet Take 1 tablet (20 mg total) by mouth as needed for edema or fluid.  [name] gabapentin (NEURONTIN) 300 MG capsule Take 300 mg by mouth 2 (two) times daily.  [name] loratadine (CLARITIN) 10 MG tablet Take 10 mg by mouth daily.   . methimazole (TAPAZOLE) 5 MG tablet TAKE 1 TABLET BY MOUTH 3 TIMES DAILY. (REFILLS TO COME FROM PRIMARY CARE PROVIDER)  . metoprolol succinate (TOPROL-XL) 100 MG 24 hr tablet Take 1 tablet (100 mg total) by mouth at bedtime. Take with or immediately following a meal.  . moxifloxacin (VIGAMOX) 0.5 % ophthalmic solution Place 1 drop into the left eye 4 times daily.  [name] neomycin-polymyxin b-dexamethasone (MAXITROL) 3.5-10000-0.1 OINT Place into the left eye every 4 (four) hours.  . pantoprazole (PROTONIX) 40 MG tablet TAKE 1 TABLET (40 MG TOTAL) BY MOUTH DAILY.  Vladimir Faster Glycol-Propyl Glycol 0.4-0.3 % SOLN Place 2 drops into the left eye every 2 (two) hours.  . Probiotic Product (PROBIOTIC PO) Take 1 capsule by mouth daily.  [name] spironolactone (ALDACTONE) 25 MG tablet Take 0.5  tablets (12.5 mg total) by mouth daily.  . valACYclovir (VALTREX) 1000 MG tablet Take 1,000 mg by mouth every morning.  . vitamin E 180 MG (400 UNITS) capsule Take 400 Units by mouth daily.  [name] ZINC CITRATE PO Take 22 mg by mouth daily.   No facility-administered encounter medications on file as of 10/01/2021.    Allergies as of 10/01/2021 - Review Complete 10/01/2021  Allergen Reaction Noted  . Erythromycin    . Nsaids Other (See Comments) 08/21/2015  . Penicillins Hives and Shortness Of Breath 08/10/2007    Past Medical History:   Diagnosis Date  . Adenomatous colon polyp 2003  . Cholelithiasis   . COVID   . Diverticulosis of colon   . Gastric polyps   . GERD (gastroesophageal reflux disease)   . Helicobacter pylori (H. pylori)   . Hiatal hernia   . Hypertension   . Mitral valve prolapse   . Nephrolithiasis   . Osteoarthritis   . Osteopenia 03/2017   T score -1.5 FRAX 8.9% / 0.9%.  Stable from prior DEXA 2016  . Persistent atrial fibrillation ([place])   . TGA (transient global amnesia)     Past Surgical History:  Procedure Laterality Date  . ANTERIOR AND POSTERIOR REPAIR N/A 06/14/2014   Procedure: ANTERIOR (CYSTOCELE) AND POSTERIOR REPAIR (RECTOCELE);  Surgeon: Anastasio Auerbach, MD;  Location: [facility];  Service: Gynecology;  Laterality: N/A;  . ATRIAL FIBRILLATION ABLATION N/A 02/11/2019   Procedure: ATRIAL FIBRILLATION ABLATION;  Surgeon: Thompson Grayer, MD;  Location: [facility];  Service: Cardiovascular;  Laterality: N/A;  . CARDIOVERSION N/A 01/15/2019   Procedure: CARDIOVERSION;  Surgeon: Fay Records, MD;  Location: [facility];  Service: Cardiovascular;  Laterality: N/A;  . CARDIOVERSION N/A 07/09/2021   Procedure: CARDIOVERSION;  Surgeon: Larey Dresser, MD;  Location: [facility] ENDOSCOPY;  Service: Cardiovascular;  Laterality: N/A;  . CHOLECYSTECTOMY    . kidney stone removal     x2  . VAGINAL HYSTERECTOMY N/A 06/14/2014   Procedure: HYSTERECTOMY VAGINAL;  Surgeon: Anastasio Auerbach, MD;  Location: [facility];  Service: Gynecology;  Laterality: N/A;    Family History  Problem Relation Age of Onset  . Cancer Father        prostate  . Breast cancer Paternal Aunt 39  . Diabetes Maternal Grandmother   . Heart disease Maternal Grandmother   . Heart disease Maternal Grandfather   . Melanoma Paternal Aunt   . Neuropathy Mother   . Congestive Heart Failure Mother   . Thyroid disease Daughter     Social History   Socioeconomic History  . Marital status: Divorced    Spouse name: Not on file  .  Number of children: 2  . Years of education: 12th grade  . Highest education level: High school graduate  Occupational History  . Occupation: Retired,  Tobacco Use  . Smoking status: Never  . Smokeless tobacco: Never  . Tobacco comments:    Never smoke 07/16/21  Vaping Use  . Vaping Use: Never used  Substance and Sexual Activity  . Alcohol use: Never    Alcohol/week: 0.0 standard drinks of alcohol  . Drug use: No  . Sexual activity: Yes    Birth control/protection: Post-menopausal    Comment: 1st intercourse 71 yo-Fewer than 5 partners  Other Topics Concern  . Not on file  Social History Narrative   Lives along with her dog. She enjoys croteching, crafts, making wreaths, reading and gardening in her free time.  Social Determinants of Health   Financial Resource Strain: Low Risk  (03/26/2021)   Overall Financial Resource Strain (CARDIA)   . Difficulty of Paying Living Expenses: Not hard at all  Food Insecurity: No Food Insecurity (03/26/2021)   Hunger Vital Sign   . Worried About Charity fundraiser in the Last Year: Never true   . Ran Out of Food in the Last Year: Never true  Transportation Needs: No Transportation Needs (03/26/2021)   PRAPARE - Transportation   . Lack of Transportation (Medical): No   . Lack of Transportation (Non-Medical): No  Physical Activity: Sufficiently Active (03/26/2021)   Exercise Vital Sign   . Days of Exercise per Week: 3 days   . Minutes of Exercise per Session: 120 min  Stress: No Stress Concern Present (03/26/2021)   [place]   . Feeling of Stress : Not at all  Social Connections: Moderately Isolated (03/26/2021)   Social Connection and Isolation Panel [NHANES]   . Frequency of Communication with Friends and Family: More than three times a week   . Frequency of Social Gatherings with Friends and Family: More than three times a week   . Attends Religious Services: More than 4  times per year   . Active Member of Clubs or Organizations: No   . Attends Archivist Meetings: Never   . Marital Status: Divorced  Human resources officer Violence: Not At Risk (03/26/2021)   Humiliation, Afraid, Rape, and Kick questionnaire   . Fear of Current or Ex-Partner: No   . Emotionally Abused: No   . Physically Abused: No   . Sexually Abused: No    Review of Systems  Constitutional:  Positive for fatigue.    Vitals:   10/01/21 1040  BP: 120/70  Pulse: 75  Temp: 98.3 F (36.8 C)  SpO2: 98%     Physical Exam Constitutional:      Appearance: She is obese.  HENT:     Head: Normocephalic.     Mouth/Throat:     Mouth: Mucous membranes are moist.  Cardiovascular:     Rate and Rhythm: Normal rate and regular rhythm.     Heart sounds: No murmur heard.    No friction rub.  Pulmonary:     Effort: No respiratory distress.     Breath sounds: No stridor. No wheezing or rhonchi.  Musculoskeletal:     Cervical back: No rigidity or tenderness.  Neurological:     Mental Status: She is alert.  Psychiatric:        Mood and Affect: Mood normal.   Data Reviewed: Hospital records reviewed Recent chest x-ray reviewed  Assessment:  Recent pneumonia  Recent history of hemoptysis  Atrial fibrillation  Sinusitis  Plan/Recommendations: Course of Levaquin was provided to patient to be used if progressive symptoms  Continue albuterol as needed  Continue Flonase and Claritin for allergies  Call with significant concerns  Tentative follow-up in 3 months  Encouraged to call with significant concerns  May consider pulmonary function test if still has concerns for shortness of breath at next visit   Sherrilyn Rist MD [facility] 10/01/2021, 11:11 AM  CC: [name], DO

## 2021-10-04 ENCOUNTER — Encounter: Payer: Self-pay | Admitting: Pulmonary Disease

## 2021-10-05 DIAGNOSIS — B6013 Keratoconjunctivitis due to Acanthamoeba: Secondary | ICD-10-CM | POA: Diagnosis not present

## 2021-10-05 DIAGNOSIS — L718 Other rosacea: Secondary | ICD-10-CM | POA: Diagnosis not present

## 2021-10-12 ENCOUNTER — Other Ambulatory Visit: Payer: Self-pay | Admitting: Family Medicine

## 2021-11-01 ENCOUNTER — Telehealth: Payer: PPO | Admitting: Physician Assistant

## 2021-11-01 ENCOUNTER — Encounter: Payer: Self-pay | Admitting: Family Medicine

## 2021-11-01 DIAGNOSIS — J069 Acute upper respiratory infection, unspecified: Secondary | ICD-10-CM

## 2021-11-01 DIAGNOSIS — B9689 Other specified bacterial agents as the cause of diseases classified elsewhere: Secondary | ICD-10-CM | POA: Diagnosis not present

## 2021-11-01 MED ORDER — BENZONATATE 100 MG PO CAPS: 100.0000 mg | ORAL_CAPSULE | Freq: Three times a day (TID) | ORAL | 0 refills | Status: DC | PRN

## 2021-11-01 MED ORDER — AZITHROMYCIN 250 MG PO TABS: ORAL_TABLET | ORAL | 0 refills | Status: DC

## 2021-11-01 MED ORDER — PROMETHAZINE-DM 6.25-15 MG/5ML PO SYRP: 5.0000 mL | ORAL_SOLUTION | Freq: Four times a day (QID) | ORAL | 0 refills | Status: DC | PRN

## 2021-11-01 MED ORDER — DOXYCYCLINE HYCLATE 100 MG PO TABS: 100.0000 mg | ORAL_TABLET | Freq: Two times a day (BID) | ORAL | 0 refills | Status: DC

## 2021-11-01 NOTE — Progress Notes (Signed)
Virtual Visit Consent   Anna Marquez, you are scheduled for a virtual visit with a [facility] provider today. Just as with appointments in the office, your consent must be obtained to participate. Your consent will be active for this visit and any virtual visit you may have with one of our providers in the next 365 days. If you have a MyChart account, a copy of this consent can be sent to you electronically.  As this is a virtual visit, video technology does not allow for your provider to perform a traditional examination. This may limit your provider's ability to fully assess your condition. If your provider identifies any concerns that need to be evaluated in person or the need to arrange testing (such as labs, EKG, etc.), we will make arrangements to do so. Although advances in technology are sophisticated, we cannot ensure that it will always work on either your end or our end. If the connection with a video visit is poor, the visit may have to be switched to a telephone visit. With either a video or telephone visit, we are not always able to ensure that we have a secure connection.  By engaging in this virtual visit, you consent to the provision of healthcare and authorize for your insurance to be billed (if applicable) for the services provided during this visit. Depending on your insurance coverage, you may receive a charge related to this service.  I need to obtain your verbal consent now. Are you willing to proceed with your visit today? Anna Marquez has provided verbal consent on 11/01/2021 for a virtual visit (video or telephone). Mar Daring, PA-C  Date: 11/01/2021 6:30 PM  Virtual Visit via Video Note   I, Mar Daring, connected with  Anna Marquez  ([id number], June 24, 1950) on 11/01/21 at  6:30 PM EDT by a video-enabled telemedicine application and verified that I am speaking with the correct person using two identifiers.  Location: Patient: Virtual Visit Location  Patient: Home Provider: Virtual Visit Location Provider: Home Office   I discussed the limitations of evaluation and management by telemedicine and the availability of in person appointments. The patient expressed understanding and agreed to proceed.    History of Present Illness: Anna Marquez is a 71 y.o. who identifies as a female who was assigned female at birth, and is being seen today for URI symptoms.  HPI: URI  This is a new problem. The current episode started in the past 7 days. The problem has been gradually worsening. There has been no fever (low grade). Associated symptoms include congestion, coughing, headaches, rhinorrhea, sinus pain and a sore throat (just today). Pertinent negatives include no diarrhea, ear pain, nausea, plugged ear sensation, sneezing or vomiting. Associated symptoms comments: Post nasal drainage. Treatments tried: albuterol, tylenol, vit c, zinc, flonase. The treatment provided no relief.      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   Multiple thyroid nodules 09/12/2021   Secondary hypercoagulable state ([place]) 07/16/2021   Hyperglycemia 07/03/2021   AKI (acute kidney injury) ([place]) 07/03/2021   Conjunctivitis 06/14/2021   Thyroid nodule 06/08/2021   Hyperthyroidism 06/07/2021   Chronic diastolic heart failure ([place]) 06/07/2021   Bilateral impacted cerumen 05/09/2021   Sinobronchitis 02/01/2019   Seasonal allergic rhinitis due to pollen 09/25/2018   Reactive airway disease 09/25/2018   Hypomagnesemia 03/25/2018   Atrial fibrillation with RVR ([place]) 03/16/2018   Anemia 02/04/2018   Recurrent  nephrolithiasis 02/04/2018   Screening for lipid disorders 10/28/2017   Cough 11/21/2016   TIA (transient ischemic attack) 03/07/2016   Essential hypertension 01/27/2012   Persistent atrial fibrillation ([place]) 01/09/2012   Migraine without aura 05/20/2008   Allergic rhinitis 05/20/2008   Mitral  regurgitation 08/10/2007   GERD 08/10/2007   Diaphragmatic hernia 08/10/2007   DIVERTICULOSIS, COLON 08/10/2007   Osteoarthritis 08/10/2007   COLONIC POLYPS, ADENOMATOUS, HX OF 08/10/2007    Allergies:  Allergies  Allergen Reactions   Erythromycin     Upset stomach   Nsaids Other (See Comments)    Has a history of bleeding ulcers, tolerates aspirin    Penicillins Hives and Shortness Of Breath    Did it involve swelling of the face/tongue/throat, SOB, or low BP? Yes Did it involve sudden or severe rash/hives, skin peeling, or any reaction on the inside of your mouth or nose? No Did you need to seek medical attention at a hospital or doctor's office? No When did it last happen?      45 years If all above answers are "NO", may proceed with cephalosporin use.    Medications:  Current Outpatient Medications:    azithromycin (ZITHROMAX) 250 MG tablet, Take 2 tablets on day 1, then 1 tablet daily on days 2 through 5, Disp: 6 tablet, Rfl: 0   benzonatate (TESSALON) 100 MG capsule, Take 1 capsule (100 mg total) by mouth 3 (three) times daily as needed., Disp: 30 capsule, Rfl: 0   promethazine-dextromethorphan (PROMETHAZINE-DM) 6.25-15 MG/5ML syrup, Take 5 mLs by mouth 4 (four) times daily as needed., Disp: 118 mL, Rfl: 0   acetaminophen (TYLENOL) 500 MG tablet, Take 1,000 mg by mouth every 6 (six) hours as needed for moderate pain or headache., Disp: , Rfl:    albuterol (VENTOLIN HFA) 108 (90 Base) MCG/ACT inhaler, INHALE 1 TO 2 PUFFS INTO THE LUNGS EVERY 6 HOURS AS NEEDED FOR WHEEZING OR SHORTNESS OF BREATH., Disp: 8.5 g, Rfl: 3   amiodarone (PACERONE) 200 MG tablet, Take 1 tablet (200 mg total) by mouth daily., Disp: 30 tablet, Rfl: 3   Ascorbic Acid (VITAMIN C) 1000 MG tablet, Take 1,000 mg by mouth daily., Disp: , Rfl:    Cholecalciferol (VITAMIN D) 50 MCG (2000 UT) tablet, Take 2,000 Units by mouth daily., Disp: , Rfl:    dapagliflozin propanediol (FARXIGA) 10 MG TABS tablet, Take 1  tablet (10 mg total) by mouth daily., Disp: 30 tablet, Rfl: 5   diclofenac Sodium (VOLTAREN) 1 % GEL, Apply 2-4 g topically 4 (four) times daily as needed (knees,ankles and hand pain)., Disp: , Rfl:    ELIQUIS 5 MG TABS tablet, TAKE 1 TABLET BY MOUTH 2 TIMES DAILY., Disp: 60 tablet, Rfl: 5   ferrous sulfate 325 (65 FE) MG tablet, Take 325 mg by mouth every other day., Disp: , Rfl:    fluticasone (FLONASE) 50 MCG/ACT nasal spray, PLACE 1 SPRAY INTO BOTH NOSTRILS AS NEEDED FOR ALLERGIES OR RHINITIS., Disp: 16 g, Rfl: 6   furosemide (LASIX) 20 MG tablet, Take 1 tablet (20 mg total) by mouth as needed for edema or fluid., Disp: 90 tablet, Rfl: 3   gabapentin (NEURONTIN) 300 MG capsule, Take 300 mg by mouth 2 (two) times daily., Disp: , Rfl:    loratadine (CLARITIN) 10 MG tablet, Take 10 mg by mouth daily. , Disp: , Rfl:    methimazole (TAPAZOLE) 5 MG tablet, TAKE 1 TABLET BY MOUTH 3 TIMES DAILY. (REFILLS TO COME FROM PRIMARY CARE PROVIDER),  Disp: 90 tablet, Rfl: 0   metoprolol succinate (TOPROL-XL) 100 MG 24 hr tablet, Take 1 tablet (100 mg total) by mouth at bedtime. Take with or immediately following a meal., Disp: 30 tablet, Rfl: 3   moxifloxacin (VIGAMOX) 0.5 % ophthalmic solution, Place 1 drop into the left eye 4 times daily., Disp: , Rfl:    neomycin-polymyxin b-dexamethasone (MAXITROL) 3.5-10000-0.1 OINT, Place into the left eye every 4 (four) hours., Disp: , Rfl:    pantoprazole (PROTONIX) 40 MG tablet, TAKE 1 TABLET (40 MG TOTAL) BY MOUTH DAILY., Disp: 90 tablet, Rfl: 4   Polyethyl Glycol-Propyl Glycol 0.4-0.3 % SOLN, Place 2 drops into the left eye every 2 (two) hours., Disp: , Rfl:    Probiotic Product (PROBIOTIC PO), Take 1 capsule by mouth daily., Disp: , Rfl:    spironolactone (ALDACTONE) 25 MG tablet, Take 0.5 tablets (12.5 mg total) by mouth daily., Disp: 15 tablet, Rfl: 11   valACYclovir (VALTREX) 1000 MG tablet, Take 1,000 mg by mouth every morning., Disp: , Rfl:    vitamin E 180 MG  (400 UNITS) capsule, Take 400 Units by mouth daily., Disp: , Rfl:    ZINC CITRATE PO, Take 22 mg by mouth daily., Disp: , Rfl:   Observations/Objective: Patient is well-developed, well-nourished in no acute distress.  Resting comfortably at home.  Head is normocephalic, atraumatic.  No labored breathing.  Speech is clear and coherent with logical content.  Patient is alert and oriented at baseline.    Assessment and Plan: 1. Bacterial upper respiratory infection - azithromycin (ZITHROMAX) 250 MG tablet; Take 2 tablets on day 1, then 1 tablet daily on days 2 through 5  Dispense: 6 tablet; Refill: 0 - promethazine-dextromethorphan (PROMETHAZINE-DM) 6.25-15 MG/5ML syrup; Take 5 mLs by mouth 4 (four) times daily as needed.  Dispense: 118 mL; Refill: 0 - benzonatate (TESSALON) 100 MG capsule; Take 1 capsule (100 mg total) by mouth 3 (three) times daily as needed.  Dispense: 30 capsule; Refill: 0  - Worsening symptoms that have not responded to OTC medications.  - Will give Azithromycin (No QT prolongation noted on EKGs) - Promethazine DM and tessalon prescribed for cough; Use Promethazine DM only at bedtime - Continue allergy medications.  - Steam and humidifier can help - Stay well hydrated and get plenty of rest.  - Seek in person evaluation if no symptom improvement or if symptoms worsen   Follow Up Instructions: I discussed the assessment and treatment plan with the patient. The patient was provided an opportunity to ask questions and all were answered. The patient agreed with the plan and demonstrated an understanding of the instructions.  A copy of instructions were sent to the patient via MyChart unless otherwise noted below.    The patient was advised to call back or seek an in-person evaluation if the symptoms worsen or if the condition fails to improve as anticipated.  Time:  I spent 12 minutes with the patient via telehealth technology discussing the above problems/concerns.     Mar Daring, PA-C

## 2021-11-01 NOTE — Patient Instructions (Signed)
[name], thank you for joining Mar Daring, PA-C for today's virtual visit.  While this provider is not your primary care provider (PCP), if your PCP is located in our provider database this encounter information will be shared with them immediately following your visit.   [place] account gives you access to today's visit and all your visits, tests, and labs performed at [facility] " click here if you don't have a [facility] account or go to [email or web address]  Consent: (Patient) Anna Marquez provided verbal consent for this virtual visit at the beginning of the encounter.  Current Medications:  Current Outpatient Medications:    azithromycin (ZITHROMAX) 250 MG tablet, Take 2 tablets on day 1, then 1 tablet daily on days 2 through 5, Disp: 6 tablet, Rfl: 0   benzonatate (TESSALON) 100 MG capsule, Take 1 capsule (100 mg total) by mouth 3 (three) times daily as needed., Disp: 30 capsule, Rfl: 0   promethazine-dextromethorphan (PROMETHAZINE-DM) 6.25-15 MG/5ML syrup, Take 5 mLs by mouth 4 (four) times daily as needed., Disp: 118 mL, Rfl: 0   acetaminophen (TYLENOL) 500 MG tablet, Take 1,000 mg by mouth every 6 (six) hours as needed for moderate pain or headache., Disp: , Rfl:    albuterol (VENTOLIN HFA) 108 (90 Base) MCG/ACT inhaler, INHALE 1 TO 2 PUFFS INTO THE LUNGS EVERY 6 HOURS AS NEEDED FOR WHEEZING OR SHORTNESS OF BREATH., Disp: 8.5 g, Rfl: 3   amiodarone (PACERONE) 200 MG tablet, Take 1 tablet (200 mg total) by mouth daily., Disp: 30 tablet, Rfl: 3   Ascorbic Acid (VITAMIN C) 1000 MG tablet, Take 1,000 mg by mouth daily., Disp: , Rfl:    Cholecalciferol (VITAMIN D) 50 MCG (2000 UT) tablet, Take 2,000 Units by mouth daily., Disp: , Rfl:    dapagliflozin propanediol (FARXIGA) 10 MG TABS tablet, Take 1 tablet (10 mg total) by mouth daily., Disp: 30 tablet, Rfl: 5   diclofenac Sodium (VOLTAREN) 1 % GEL, Apply 2-4 g topically 4 (four)  times daily as needed (knees,ankles and hand pain)., Disp: , Rfl:    ELIQUIS 5 MG TABS tablet, TAKE 1 TABLET BY MOUTH 2 TIMES DAILY., Disp: 60 tablet, Rfl: 5   ferrous sulfate 325 (65 FE) MG tablet, Take 325 mg by mouth every other day., Disp: , Rfl:    fluticasone (FLONASE) 50 MCG/ACT nasal spray, PLACE 1 SPRAY INTO BOTH NOSTRILS AS NEEDED FOR ALLERGIES OR RHINITIS., Disp: 16 g, Rfl: 6   furosemide (LASIX) 20 MG tablet, Take 1 tablet (20 mg total) by mouth as needed for edema or fluid., Disp: 90 tablet, Rfl: 3   gabapentin (NEURONTIN) 300 MG capsule, Take 300 mg by mouth 2 (two) times daily., Disp: , Rfl:    loratadine (CLARITIN) 10 MG tablet, Take 10 mg by mouth daily. , Disp: , Rfl:    methimazole (TAPAZOLE) 5 MG tablet, TAKE 1 TABLET BY MOUTH 3 TIMES DAILY. (REFILLS TO COME FROM PRIMARY CARE PROVIDER), Disp: 90 tablet, Rfl: 0   metoprolol succinate (TOPROL-XL) 100 MG 24 hr tablet, Take 1 tablet (100 mg total) by mouth at bedtime. Take with or immediately following a meal., Disp: 30 tablet, Rfl: 3   moxifloxacin (VIGAMOX) 0.5 % ophthalmic solution, Place 1 drop into the left eye 4 times daily., Disp: , Rfl:    neomycin-polymyxin b-dexamethasone (MAXITROL) 3.5-10000-0.1 OINT, Place into the left eye every 4 (four) hours., Disp: , Rfl:    pantoprazole (PROTONIX) 40 MG tablet, TAKE  1 TABLET (40 MG TOTAL) BY MOUTH DAILY., Disp: 90 tablet, Rfl: 4   Polyethyl Glycol-Propyl Glycol 0.4-0.3 % SOLN, Place 2 drops into the left eye every 2 (two) hours., Disp: , Rfl:    Probiotic Product (PROBIOTIC PO), Take 1 capsule by mouth daily., Disp: , Rfl:    spironolactone (ALDACTONE) 25 MG tablet, Take 0.5 tablets (12.5 mg total) by mouth daily., Disp: 15 tablet, Rfl: 11   valACYclovir (VALTREX) 1000 MG tablet, Take 1,000 mg by mouth every morning., Disp: , Rfl:    vitamin E 180 MG (400 UNITS) capsule, Take 400 Units by mouth daily., Disp: , Rfl:    ZINC CITRATE PO, Take 22 mg by mouth daily., Disp: , Rfl:     Medications ordered in this encounter:  Meds ordered this encounter  Medications   azithromycin (ZITHROMAX) 250 MG tablet    Sig: Take 2 tablets on day 1, then 1 tablet daily on days 2 through 5    Dispense:  6 tablet    Refill:  0    Order Specific Question:   Supervising Provider    Answer:   Chase Picket [[id number]]   promethazine-dextromethorphan (PROMETHAZINE-DM) 6.25-15 MG/5ML syrup    Sig: Take 5 mLs by mouth 4 (four) times daily as needed.    Dispense:  118 mL    Refill:  0    Order Specific Question:   Supervising Provider    Answer:   Chase Picket [[id number]]   benzonatate (TESSALON) 100 MG capsule    Sig: Take 1 capsule (100 mg total) by mouth 3 (three) times daily as needed.    Dispense:  30 capsule    Refill:  0    Order Specific Question:   Supervising Provider    Answer:   Chase Picket [id number]     *If you need refills on other medications prior to your next appointment, please contact your pharmacy*  Follow-Up: Call back or seek an in-person evaluation if the symptoms worsen or if the condition fails to improve as anticipated.  [place] [phone]  Other Instructions  Upper Respiratory Infection, Adult An upper respiratory infection (URI) affects the nose, throat, and upper airways that lead to the lungs. The most common type of URI is often called the common cold. URIs usually get better on their own, without medical treatment. What are the causes? A URI is caused by a germ (virus). You may catch these germs by: Breathing in droplets from an infected person's cough or sneeze. Touching something that has the germ on it (is contaminated) and then touching your mouth, nose, or eyes. What increases the risk? You are more likely to get a URI if: You are very young or very old. You have close contact with others, such as at work, school, or a health care facility. You smoke. You have long-term (chronic) heart or lung  disease. You have a weakened disease-fighting system (immune system). You have nasal allergies or asthma. You have a lot of stress. You have poor nutrition. What are the signs or symptoms? Runny or stuffy (congested) nose. Cough. Sneezing. Sore throat. Headache. Feeling tired (fatigue). Fever. Not wanting to eat as much as usual. Pain in your forehead, behind your eyes, and over your cheekbones (sinus pain). Muscle aches. Redness or irritation of the eyes. Pressure in the ears or face. How is this treated? URIs usually get better on their own within 7-10 days. Medicines cannot cure URIs, but your  doctor may recommend certain medicines to help relieve symptoms, such as: Over-the-counter cold medicines. Medicines to reduce coughing (cough suppressants). Coughing is a type of defense against infection that helps to clear the nose, throat, windpipe, and lungs (respiratory system). Take these medicines only as told by your doctor. Medicines to lower your fever. Follow these instructions at home: Activity Rest as needed. If you have a fever, stay home from work or school until your fever is gone, or until your doctor says you may return to work or school. You should stay home until you cannot spread the infection anymore (you are not contagious). Your doctor may have you wear a face mask so you have less risk of spreading the infection. Relieving symptoms Rinse your mouth often with salt water. To make salt water, dissolve -1 tsp (3-6 g) of salt in 1 cup (237 mL) of warm water. Use a cool-mist humidifier to add moisture to the air. This can help you breathe more easily. Eating and drinking  Drink enough fluid to keep your pee (urine) pale yellow. Eat soups and other clear broths. General instructions  Take over-the-counter and prescription medicines only as told by your doctor. Do not smoke or use any products that contain nicotine or tobacco. If you need help quitting, ask your  doctor. Avoid being where people are smoking (avoid secondhand smoke). Stay up to date on all your shots (immunizations), and get the flu shot every year. Keep all follow-up visits. How to prevent the spread of infection to others  Wash your hands with soap and water for at least 20 seconds. If you cannot use soap and water, use hand sanitizer. Avoid touching your mouth, face, eyes, or nose. Cough or sneeze into a tissue or your sleeve or elbow. Do not cough or sneeze into your hand or into the air. Contact a doctor if: You are getting worse, not better. You have any of these: A fever or chills. Brown or red mucus in your nose. Yellow or brown fluid (discharge)coming from your nose. Pain in your face, especially when you bend forward. Swollen neck glands. Pain when you swallow. White areas in the back of your throat. Get help right away if: You have shortness of breath that gets worse. You have very bad or constant: Headache. Ear pain. Pain in your forehead, behind your eyes, and over your cheekbones (sinus pain). Chest pain. You have long-lasting (chronic) lung disease along with any of these: Making high-pitched whistling sounds when you breathe, most often when you breathe out (wheezing). Long-lasting cough (more than 14 days). Coughing up blood. A change in your usual mucus. You have a stiff neck. You have changes in your: Vision. Hearing. Thinking. Mood. These symptoms may be an emergency. Get help right away. Call 911. Do not wait to see if the symptoms will go away. Do not drive yourself to the hospital. Summary An upper respiratory infection (URI) is caused by a germ (virus). The most common type of URI is often called the common cold. URIs usually get better within 7-10 days. Take over-the-counter and prescription medicines only as told by your doctor. This information is not intended to replace advice given to you by your health care provider. Make sure you  discuss any questions you have with your health care provider. Document Revised: 07/26/2020 Document Reviewed: 07/26/2020 Elsevier Patient Education  [place].    If you have been instructed to have an in-person evaluation today at a local Urgent Care facility,  please use the link below. It will take you to a list of all of our available [facility], including address, phone number and hours of operation. Please do not delay care.  [facility]  If you or a family member do not have a primary care provider, use the link below to schedule a visit and establish care. When you choose a [facility] primary care physician or advanced practice provider, you gain a long-term partner in health. Find a Primary Care Provider  Learn more about [facility] in-office and virtual care options: [facility]

## 2021-11-02 ENCOUNTER — Encounter: Payer: Self-pay | Admitting: Physician Assistant

## 2021-11-05 ENCOUNTER — Ambulatory Visit: Payer: PPO | Attending: Cardiology

## 2021-11-05 DIAGNOSIS — I4819 Other persistent atrial fibrillation: Secondary | ICD-10-CM | POA: Diagnosis not present

## 2021-11-05 DIAGNOSIS — Z01812 Encounter for preprocedural laboratory examination: Secondary | ICD-10-CM | POA: Diagnosis not present

## 2021-11-06 LAB — BASIC METABOLIC PANEL
BUN/Creatinine Ratio: 21 (ref 12–28)
BUN: 23 mg/dL (ref 8–27)
CO2: 20 mmol/L (ref 20–29)
Calcium: 9.4 mg/dL (ref 8.7–10.3)
Chloride: 105 mmol/L (ref 96–106)
Creatinine, Ser: 1.11 mg/dL — ABNORMAL HIGH (ref 0.57–1.00)
Glucose: 120 mg/dL — ABNORMAL HIGH (ref 70–99)
Potassium: 3.7 mmol/L (ref 3.5–5.2)
Sodium: 141 mmol/L (ref 134–144)
eGFR: 53 mL/min/{1.73_m2} — ABNORMAL LOW (ref >59)

## 2021-11-06 LAB — CBC
Hematocrit: 37.9 % (ref 34.0–46.6)
Hemoglobin: 12.4 g/dL (ref 11.1–15.9)
MCH: 30.8 pg (ref 26.6–33.0)
MCHC: 32.7 g/dL (ref 31.5–35.7)
MCV: 94 fL (ref 79–97)
Platelets: 172 10*3/uL (ref 150–450)
RBC: 4.02 x10E6/uL (ref 3.77–5.28)
RDW: 12.6 % (ref 11.7–15.4)
WBC: 4.8 10*3/uL (ref 3.4–10.8)

## 2021-11-08 ENCOUNTER — Ambulatory Visit (INDEPENDENT_AMBULATORY_CARE_PROVIDER_SITE_OTHER): Payer: PPO | Admitting: Pulmonary Disease

## 2021-11-08 DIAGNOSIS — R0602 Shortness of breath: Secondary | ICD-10-CM

## 2021-11-08 LAB — PULMONARY FUNCTION TEST
DL/VA % pred: 97 %
DL/VA: 4.03 ml/min/mmHg/L
DLCO cor % pred: 86 %
DLCO cor: 16.72 ml/min/mmHg
DLCO unc % pred: 83 %
DLCO unc: 16.19 ml/min/mmHg
FEF 25-75 Post: 3.49 L/sec
FEF 25-75 Pre: 2.28 L/sec
FEF2575-%Change-Post: 52 %
FEF2575-%Pred-Post: 193 %
FEF2575-%Pred-Pre: 126 %
FEV1-%Change-Post: 18 %
FEV1-%Pred-Post: 94 %
FEV1-%Pred-Pre: 79 %
FEV1-Post: 2.09 L
FEV1-Pre: 1.76 L
FEV1FVC-%Change-Post: 2 %
FEV1FVC-%Pred-Pre: 115 %
FEV6-%Change-Post: 16 %
FEV6-%Pred-Post: 84 %
FEV6-%Pred-Pre: 72 %
FEV6-Post: 2.34 L
FEV6-Pre: 2.01 L
FEV6FVC-%Pred-Post: 104 %
FEV6FVC-%Pred-Pre: 104 %
FVC-%Change-Post: 16 %
FVC-%Pred-Post: 80 %
FVC-%Pred-Pre: 68 %
FVC-Post: 2.34 L
FVC-Pre: 2.01 L
Post FEV1/FVC ratio: 89 %
Post FEV6/FVC ratio: 100 %
Pre FEV1/FVC ratio: 87 %
Pre FEV6/FVC Ratio: 100 %
RV % pred: 88 %
RV: 1.98 L
TLC % pred: 89 %
TLC: 4.54 L

## 2021-11-08 NOTE — Progress Notes (Signed)
PFT done today. 

## 2021-11-09 DIAGNOSIS — B6013 Keratoconjunctivitis due to Acanthamoeba: Secondary | ICD-10-CM | POA: Diagnosis not present

## 2021-11-09 DIAGNOSIS — L718 Other rosacea: Secondary | ICD-10-CM | POA: Diagnosis not present

## 2021-11-15 ENCOUNTER — Telehealth (HOSPITAL_COMMUNITY): Payer: Self-pay | Admitting: Emergency Medicine

## 2021-11-15 NOTE — Telephone Encounter (Signed)
Unable to leave vm Cavon Nicolls RN Navigator Cardiac Imaging [facility] [phone] Office  [phone] Cell  

## 2021-11-16 ENCOUNTER — Ambulatory Visit (HOSPITAL_BASED_OUTPATIENT_CLINIC_OR_DEPARTMENT_OTHER)
Admission: RE | Admit: 2021-11-16 | Discharge: 2021-11-16 | Disposition: A | Discharge status: Home / Self Care | Payer: PPO | Source: Ambulatory Visit | Attending: Cardiology | Admitting: Cardiology

## 2021-11-16 DIAGNOSIS — I4819 Other persistent atrial fibrillation: Secondary | ICD-10-CM | POA: Insufficient documentation

## 2021-11-16 MED ORDER — IOHEXOL 350 MG/ML SOLN
80.0000 mL | Freq: Once | INTRAVENOUS | Status: AC | PRN
Start: 2021-11-16 — End: 2021-11-16
  Administered 2021-11-16: 80 mL via INTRAVENOUS

## 2021-11-22 NOTE — Pre-Procedure Instructions (Signed)
Instructed patient on the following items: Arrival time 0800 Nothing to eat or drink after midnight No meds AM of procedure Responsible person to drive you home and stay with you for 24 hrs  Have you missed any doses of anti-coagulant Hasn't missed any doses

## 2021-11-23 ENCOUNTER — Other Ambulatory Visit: Payer: Self-pay

## 2021-11-23 ENCOUNTER — Encounter (HOSPITAL_COMMUNITY)
Admission: RE | Disposition: A | Discharge status: Home / Self Care | Payer: PPO | Source: Home / Self Care | Attending: Cardiology

## 2021-11-23 ENCOUNTER — Ambulatory Visit (HOSPITAL_COMMUNITY): Payer: PPO | Admitting: Certified Registered Nurse Anesthetist

## 2021-11-23 ENCOUNTER — Ambulatory Visit (HOSPITAL_BASED_OUTPATIENT_CLINIC_OR_DEPARTMENT_OTHER): Payer: PPO | Admitting: Certified Registered Nurse Anesthetist

## 2021-11-23 ENCOUNTER — Ambulatory Visit (HOSPITAL_COMMUNITY)
Admission: RE | Admit: 2021-11-23 | Discharge: 2021-11-23 | Disposition: A | Discharge status: Home / Self Care | Payer: PPO | Attending: Cardiology | Admitting: Cardiology

## 2021-11-23 DIAGNOSIS — I4891 Unspecified atrial fibrillation: Secondary | ICD-10-CM | POA: Diagnosis not present

## 2021-11-23 DIAGNOSIS — I509 Heart failure, unspecified: Secondary | ICD-10-CM | POA: Diagnosis not present

## 2021-11-23 DIAGNOSIS — I2510 Atherosclerotic heart disease of native coronary artery without angina pectoris: Secondary | ICD-10-CM

## 2021-11-23 DIAGNOSIS — I110 Hypertensive heart disease with heart failure: Secondary | ICD-10-CM

## 2021-11-23 DIAGNOSIS — I341 Nonrheumatic mitral (valve) prolapse: Secondary | ICD-10-CM | POA: Diagnosis not present

## 2021-11-23 DIAGNOSIS — I4819 Other persistent atrial fibrillation: Secondary | ICD-10-CM | POA: Diagnosis not present

## 2021-11-23 HISTORY — PX: ATRIAL FIBRILLATION ABLATION: EP1191

## 2021-11-23 LAB — POCT ACTIVATED CLOTTING TIME
Activated Clotting Time: 299 seconds
Activated Clotting Time: 317 seconds

## 2021-11-23 SURGERY — ATRIAL FIBRILLATION ABLATION
Anesthesia: General

## 2021-11-23 MED ORDER — HEPARIN (PORCINE) IN NACL 1000-0.9 UT/500ML-% IV SOLN
INTRAVENOUS | Status: DC | PRN
  Administered 2021-11-23 (×4): 500 mL

## 2021-11-23 MED ORDER — HEPARIN SODIUM (PORCINE) 1000 UNIT/ML IJ SOLN
INTRAMUSCULAR | Status: DC | PRN
  Administered 2021-11-23: 1000 [IU] via INTRAVENOUS

## 2021-11-23 MED ORDER — LIDOCAINE 2% (20 MG/ML) 5 ML SYRINGE
INTRAMUSCULAR | Status: DC | PRN
  Administered 2021-11-23: 80 mg via INTRAVENOUS

## 2021-11-23 MED ORDER — DOBUTAMINE INFUSION FOR EP/ECHO/NUC (1000 MCG/ML)
INTRAVENOUS | Status: AC
  Filled 2021-11-23: qty 250

## 2021-11-23 MED ORDER — HEPARIN SODIUM (PORCINE) 1000 UNIT/ML IJ SOLN
INTRAMUSCULAR | Status: AC
  Filled 2021-11-23: qty 10

## 2021-11-23 MED ORDER — ONDANSETRON HCL 4 MG/2ML IJ SOLN
INTRAMUSCULAR | Status: DC | PRN
  Administered 2021-11-23: 4 mg via INTRAVENOUS

## 2021-11-23 MED ORDER — DOBUTAMINE INFUSION FOR EP/ECHO/NUC (1000 MCG/ML)
INTRAVENOUS | Status: DC | PRN
  Administered 2021-11-23: 20 ug/kg/min via INTRAVENOUS

## 2021-11-23 MED ORDER — SODIUM CHLORIDE 0.9 % IV SOLN: 250.0000 mL | INTRAVENOUS | Status: DC | PRN

## 2021-11-23 MED ORDER — PHENYLEPHRINE HCL-NACL 20-0.9 MG/250ML-% IV SOLN
INTRAVENOUS | Status: DC | PRN
  Administered 2021-11-23: 25 ug/min via INTRAVENOUS

## 2021-11-23 MED ORDER — ONDANSETRON HCL 4 MG/2ML IJ SOLN
INTRAMUSCULAR | Status: AC
  Filled 2021-11-23: qty 2

## 2021-11-23 MED ORDER — PROTAMINE SULFATE 10 MG/ML IV SOLN
INTRAVENOUS | Status: DC | PRN
  Administered 2021-11-23: 40 mg via INTRAVENOUS

## 2021-11-23 MED ORDER — FENTANYL CITRATE (PF) 250 MCG/5ML IJ SOLN
INTRAMUSCULAR | Status: DC | PRN
Start: 2021-11-23 — End: 2021-11-23
  Administered 2021-11-23 (×2): 50 ug via INTRAVENOUS

## 2021-11-23 MED ORDER — SODIUM CHLORIDE 0.9% FLUSH: 3.0000 mL | Freq: Two times a day (BID) | INTRAVENOUS | Status: DC

## 2021-11-23 MED ORDER — SODIUM CHLORIDE 0.9 % IV SOLN: INTRAVENOUS | Status: DC

## 2021-11-23 MED ORDER — ACETAMINOPHEN 325 MG PO TABS: 650.0000 mg | ORAL_TABLET | ORAL | Status: DC | PRN

## 2021-11-23 MED ORDER — HEPARIN SODIUM (PORCINE) 1000 UNIT/ML IJ SOLN
INTRAMUSCULAR | Status: DC | PRN
  Administered 2021-11-23: 14000 [IU] via INTRAVENOUS

## 2021-11-23 MED ORDER — HEPARIN (PORCINE) IN NACL 1000-0.9 UT/500ML-% IV SOLN
INTRAVENOUS | Status: AC
  Filled 2021-11-23: qty 2000

## 2021-11-23 MED ORDER — SODIUM CHLORIDE 0.9% FLUSH: 3.0000 mL | INTRAVENOUS | Status: DC | PRN

## 2021-11-23 MED ORDER — ROCURONIUM BROMIDE 10 MG/ML (PF) SYRINGE
PREFILLED_SYRINGE | INTRAVENOUS | Status: DC | PRN
  Administered 2021-11-23: 60 mg via INTRAVENOUS

## 2021-11-23 MED ORDER — SUGAMMADEX SODIUM 200 MG/2ML IV SOLN
INTRAVENOUS | Status: DC | PRN
  Administered 2021-11-23 (×2): 100 mg via INTRAVENOUS

## 2021-11-23 MED ORDER — PROPOFOL 10 MG/ML IV BOLUS
INTRAVENOUS | Status: DC | PRN
  Administered 2021-11-23: 50 mg via INTRAVENOUS
  Administered 2021-11-23: 110 mg via INTRAVENOUS

## 2021-11-23 MED ORDER — ONDANSETRON HCL 4 MG/2ML IJ SOLN
4.0000 mg | Freq: Four times a day (QID) | INTRAMUSCULAR | Status: DC | PRN
  Administered 2021-11-23: 4 mg via INTRAVENOUS

## 2021-11-23 MED ORDER — DEXAMETHASONE SODIUM PHOSPHATE 10 MG/ML IJ SOLN
INTRAMUSCULAR | Status: DC | PRN
  Administered 2021-11-23: 10 mg via INTRAVENOUS

## 2021-11-23 SURGICAL SUPPLY — 19 items
CATH 8FR REPROCESSED SOUNDSTAR (CATHETERS) ×1 IMPLANT
CATH 8FR SOUNDSTAR REPROCESSED (CATHETERS) IMPLANT
CATH ABLAT QDOT MICRO BI TC DF (CATHETERS) IMPLANT
CATH OCTARAY 2.0 F 3-3-3-3-3 (CATHETERS) IMPLANT
CATH PIGTAIL STEERABLE D1 8.7 (WIRE) IMPLANT
CATH S-M CIRCA TEMP PROBE (CATHETERS) IMPLANT
CATH WEBSTER BI DIR CS D-F CRV (CATHETERS) IMPLANT
CLOSURE PERCLOSE PROSTYLE (VASCULAR PRODUCTS) IMPLANT
COVER SWIFTLINK CONNECTOR (BAG) ×1 IMPLANT
PACK EP LATEX FREE (CUSTOM PROCEDURE TRAY) ×1
PACK EP LF (CUSTOM PROCEDURE TRAY) ×1 IMPLANT
PAD DEFIB RADIO PHYSIO CONN (PAD) ×1 IMPLANT
PATCH CARTO3 (PAD) IMPLANT
SHEATH CARTO VIZIGO SM CVD (SHEATH) IMPLANT
SHEATH PINNACLE 7F 10CM (SHEATH) IMPLANT
SHEATH PINNACLE 8F 10CM (SHEATH) IMPLANT
SHEATH PINNACLE 9F 10CM (SHEATH) IMPLANT
SHEATH PROBE COVER 6X72 (BAG) IMPLANT
TUBING SMART ABLATE COOLFLOW (TUBING) IMPLANT

## 2021-11-23 NOTE — Discharge Instructions (Signed)

## 2021-11-23 NOTE — Anesthesia Procedure Notes (Signed)
Procedure Name: Intubation Date/Time: 11/23/2021 10:03 AM  Performed by: Betha Loa, CRNAPre-anesthesia Checklist: Patient identified, Emergency Drugs available, Suction available and Patient being monitored Patient Re-evaluated:Patient Re-evaluated prior to induction Oxygen Delivery Method: Circle System Utilized Preoxygenation: Pre-oxygenation with 100% oxygen Induction Type: IV induction Ventilation: Mask ventilation without difficulty and Oral airway inserted - appropriate to patient size Laryngoscope Size: Mac and 3 Grade View: Grade I Tube type: Oral Tube size: 7.0 mm Number of attempts: 1 Airway Equipment and Method: Stylet and Oral airway Placement Confirmation: ETT inserted through vocal cords under direct vision, positive ETCO2 and breath sounds checked- equal and bilateral Secured at: 21 cm Tube secured with: Tape Dental Injury: Teeth and Oropharynx as per pre-operative assessment

## 2021-11-23 NOTE — Anesthesia Preprocedure Evaluation (Signed)
Anesthesia Evaluation  Patient identified by MRN, date of birth, ID band Patient awake    Reviewed: Allergy & Precautions, NPO status , Patient's Chart, lab work & pertinent test results  Airway Mallampati: II  TM Distance: >3 FB Neck ROM: Full    Dental  (+) Dental Advisory Given, Teeth Intact   Pulmonary  Extubated 6/29   breath sounds clear to auscultation       Cardiovascular hypertension, Pt. on medications and Pt. on home beta blockers + CAD and +CHF  + dysrhythmias Atrial Fibrillation + Valvular Problems/Murmurs MR  Rhythm:Irregular Rate:Normal  TEE 07/2021  1. Left ventricular ejection fraction, by estimation, is 55 to 60%. The left ventricle has normal function. There is mild left ventricular hypertrophy.   2. Peak RV-RA gradient 22 mmHg. Right ventricular systolic function is mildly  reduced. The right ventricular size is normal.   3. Left atrial size was severely dilated. No left atrial/left atrial appendage thrombus was detected.   4. Right atrial size was moderately dilated.   5. The mitral valve is abnormal. Moderate to severe central mitral valve regurgitation. PISA ERO 0.35 cm^2, vena contracta area from 3-D imaging 0.26 cm^2. Suspect functional MR, likely atrial functional MR with dilated  left atrium. No evidence of mitral stenosis.   6. The aortic valve is tricuspid. Aortic valve regurgitation is trivial. No aortic stenosis is present.   7. No PFO or ASD by color doppler.    TTE 06/2021  1. Left ventricular ejection fraction, by estimation, is 50 to 55%. The left ventricle has low normal function. The left ventricle has no regional wall motion abnormalities.   2. Right ventricular systolic function is moderately reduced. The right ventricular size is mildly enlarged. There is normal pulmonary artery systolic pressure. The estimated right ventricular systolic pressure is 84.6 mmHg.   3. A small pericardial effusion is  present. The pericardial effusion is surrounding the apex and localized near the right ventricle. There is no evidence of cardiac tamponade.   4. PISA 0.2 cm. The mitral valve is normal in structure. Mild to moderate mitral valve regurgitation. No evidence of mitral stenosis.   5. Tricuspid valve regurgitation is mild to moderate.   6. The aortic valve is normal in structure. Aortic valve regurgitation is not visualized. No aortic stenosis is present.   7. The inferior vena cava is dilated in size with >50% respiratory variability, suggesting right atrial pressure of 8 mmHg.      Neuro/Psych  Headaches TIA Neuromuscular disease    GI/Hepatic hiatal hernia,GERD  ,,Elevated LFT's   Endo/Other   Hyperthyroidism   Renal/GU Renal disease     Musculoskeletal  (+) Arthritis ,    Abdominal   Peds  Hematology  (+) Blood dyscrasia, anemia   Anesthesia Other Findings   Reproductive/Obstetrics                             Anesthesia Physical Anesthesia Plan  ASA: 3  Anesthesia Plan: General   Post-op Pain Management: Minimal or no pain anticipated   Induction: Intravenous  PONV Risk Score and Plan: 3 and Treatment may vary due to age or medical condition, Ondansetron and Dexamethasone  Airway Management Planned: Oral ETT  Additional Equipment:   Intra-op Plan:   Post-operative Plan: Extubation in OR  Informed Consent: I have reviewed the patients History and Physical, chart, labs and discussed the procedure including the risks, benefits and alternatives for  the proposed anesthesia with the patient or authorized representative who has indicated his/her understanding and acceptance.     Dental advisory given  Plan Discussed with: CRNA  Anesthesia Plan Comments:         Anesthesia Quick Evaluation

## 2021-11-23 NOTE — Transfer of Care (Signed)
Immediate Anesthesia Transfer of Care Note  Patient: Anna Marquez  Procedure(s) Performed: ATRIAL FIBRILLATION ABLATION  Patient Location: PACU  Anesthesia Type:General  Level of Consciousness: awake, alert , oriented, patient cooperative, and responds to stimulation  Airway & Oxygen Therapy: Patient Spontanous Breathing and Patient connected to nasal cannula oxygen  Post-op Assessment: Report given to RN and Post -op Vital signs reviewed and stable  Post vital signs: Reviewed and stable  Last Vitals:  Vitals Value Taken Time  BP    Temp    Pulse    Resp    SpO2      Last Pain:  Vitals:   11/23/21 0814  TempSrc: Temporal         Complications: There were no known notable events for this encounter.

## 2021-11-23 NOTE — Anesthesia Postprocedure Evaluation (Signed)
Anesthesia Post Note  Patient: Anna Marquez  Procedure(s) Performed: ATRIAL FIBRILLATION ABLATION     Patient location during evaluation: PACU Anesthesia Type: General Level of consciousness: sedated and patient cooperative Pain management: pain level controlled Vital Signs Assessment: post-procedure vital signs reviewed and stable Respiratory status: spontaneous breathing Cardiovascular status: stable Anesthetic complications: no   There were no known notable events for this encounter.  Last Vitals:  Vitals:   11/23/21 1335 11/23/21 1345  BP:    Pulse: 76 77  Resp: 20 14  Temp:    SpO2: 92% 93%    Last Pain:  Vitals:   11/23/21 1229  TempSrc:   PainSc: 0-No pain                 Nolon Nations

## 2021-11-23 NOTE — H&P (Signed)
Electrophysiology Office Note   Date:  11/23/2021   ID:  RHETT NAJERA, DOB 1950-03-22, MRN [id number]  PCP:  [name], DO  Cardiologist:  Nahser Primary Electrophysiologist:  Jamear Carbonneau Meredith Leeds, MD    Chief Complaint: AF   History of Present Illness: Anna Marquez is a 71 y.o. female who is being seen today for the evaluation of AF at the request of No ref. provider found. Presenting today for electrophysiology evaluation.  She has a history significant for atrial fibrillation, hypertension, mitral valve prolapse.  She is status post atrial fibrillation ablation 02/11/2019.  May 2023 she was admitted to the hospital with respiratory failure in the setting of atrial fibrillation and heart failure.  She spontaneously converted to sinus rhythm.  She was admitted again 07/03/2021 with distress.  She was found to be in atrial fibrillation emergency room.  She was ultimately intubated.  Cardioversion was attempted but she promptly reverted back to atrial fibrillation each time she was converted.  She had a load of amiodarone and had a successful cardioversion 07/09/2021.  She went back into atrial fibrillation multiple times since then.  Today, denies symptoms of palpitations, chest pain, shortness of breath, orthopnea, PND, lower extremity edema, claudication, dizziness, presyncope, syncope, bleeding, or neurologic sequela. The patient is tolerating medications without difficulties. Plan ablation today.    Past Medical History:  Diagnosis Date   Adenomatous colon polyp 2003   Cholelithiasis    COVID    Diverticulosis of colon    Gastric polyps    GERD (gastroesophageal reflux disease)    Helicobacter pylori (H. pylori)    Hiatal hernia    Hypertension    Mitral valve prolapse    Nephrolithiasis    Osteoarthritis    Osteopenia 03/2017   T score -1.5 FRAX 8.9% / 0.9%.  Stable from prior DEXA 2016   Persistent atrial fibrillation (HCC)    TGA (transient global amnesia)    Past  Surgical History:  Procedure Laterality Date   ANTERIOR AND POSTERIOR REPAIR N/A 06/14/2014   Procedure: ANTERIOR (CYSTOCELE) AND POSTERIOR REPAIR (RECTOCELE);  Surgeon: Anastasio Auerbach, MD;  Location: [facility];  Service: Gynecology;  Laterality: N/A;   ATRIAL FIBRILLATION ABLATION N/A 02/11/2019   Procedure: ATRIAL FIBRILLATION ABLATION;  Surgeon: Thompson Grayer, MD;  Location: [facility];  Service: Cardiovascular;  Laterality: N/A;   CARDIOVERSION N/A 01/15/2019   Procedure: CARDIOVERSION;  Surgeon: Fay Records, MD;  Location: [facility] ENDOSCOPY;  Service: Cardiovascular;  Laterality: N/A;   CARDIOVERSION N/A 07/09/2021   Procedure: CARDIOVERSION;  Surgeon: Larey Dresser, MD;  Location: [facility] ENDOSCOPY;  Service: Cardiovascular;  Laterality: N/A;   CHOLECYSTECTOMY     kidney stone removal     x2   VAGINAL HYSTERECTOMY N/A 06/14/2014   Procedure: HYSTERECTOMY VAGINAL;  Surgeon: Anastasio Auerbach, MD;  Location: [facility];  Service: Gynecology;  Laterality: N/A;     Current Facility-Administered Medications  Medication Dose Route Frequency Provider Last Rate Last Admin   0.9 %  sodium chloride infusion   Intravenous Continuous Constance Haw, MD 50 mL/hr at 11/23/21 0831 New Bag at 11/23/21 0831    Allergies:   Erythromycin, Nsaids, and Penicillins   Social History:  The patient  reports that she has never smoked. She has never used smokeless tobacco. She reports that she does not drink alcohol and does not use drugs.   Family History:  The patient's family history includes Breast cancer (age of onset: 4)  in her paternal aunt; Cancer in her father; Congestive Heart Failure in her mother; Diabetes in her maternal grandmother; Heart disease in her maternal grandfather and maternal grandmother; Melanoma in her paternal aunt; Neuropathy in her mother; Thyroid disease in her daughter.   ROS:  Please see the history of present illness.   Otherwise, review of systems is positive for none.   All  other systems are reviewed and negative.   PHYSICAL EXAM: VS:  BP (!) 129/97   Pulse 79   Temp 97.8 F (36.6 C) (Temporal)   Resp 17   Ht '5\' 4"'$  (1.626 m)   Wt 77.1 kg   SpO2 96%   BMI 29.18 kg/m  , BMI Body mass index is 29.18 kg/m. GEN: Well nourished, well developed, in no acute distress  HEENT: normal  Neck: no JVD, carotid bruits, or masses Cardiac: RRR; no murmurs, rubs, or gallops,no edema  Respiratory:  clear to auscultation bilaterally, normal work of breathing GI: soft, nontender, nondistended, + BS MS: no deformity or atrophy  Skin: warm and dry Neuro:  Strength and sensation are intact Psych: euthymic mood, full affect   Recent Labs: 07/10/2021: Magnesium 2.3 07/18/2021: ALT 22 07/24/2021: B Natriuretic Peptide 30.3 09/12/2021: TSH 4.58 11/05/2021: BUN 23; Creatinine, Ser 1.11; Hemoglobin 12.4; Platelets 172; Potassium 3.7; Sodium 141    Lipid Panel     Component Value Date/Time   CHOL 179 08/07/2020 0000   TRIG 503 (H) 07/05/2021 0100   HDL 46 (L) 08/07/2020 0000   CHOLHDL 3.9 08/07/2020 0000   VLDL 51.8 (H) 11/04/2018 1128   LDLCALC 80 08/07/2020 0000   LDLDIRECT 100.0 11/04/2018 1128     Wt Readings from Last 3 Encounters:  11/23/21 77.1 kg  10/01/21 78.5 kg  09/13/21 76.7 kg      Other studies Reviewed: Additional studies/ records that were reviewed today include: TTE 07/04/21  Review of the above records today demonstrates:   1. Left ventricular ejection fraction, by estimation, is 50 to 55%. The  left ventricle has low normal function. The left ventricle has no regional  wall motion abnormalities.   2. Right ventricular systolic function is moderately reduced. The right  ventricular size is mildly enlarged. There is normal pulmonary artery  systolic pressure. The estimated right ventricular systolic pressure is  24.8 mmHg.   3. A small pericardial effusion is present. The pericardial effusion is  surrounding the apex and localized near the  right ventricle. There is no  evidence of cardiac tamponade.   4. PISA 0.2 cm. The mitral valve is normal in structure. Mild to moderate  mitral valve regurgitation. No evidence of mitral stenosis.   5. Tricuspid valve regurgitation is mild to moderate.   6. The aortic valve is normal in structure. Aortic valve regurgitation is  not visualized. No aortic stenosis is present.   7. The inferior vena cava is dilated in size with >50% respiratory  variability, suggesting right atrial pressure of 8 mmHg.    ASSESSMENT AND PLAN:  1.  Persistent atrial fibrillation/flutter: RAECHELLE SARTI has presented today for surgery, with the diagnosis of AF.  The various methods of treatment have been discussed with the patient and family. After consideration of risks, benefits and other options for treatment, the patient has consented to  Procedure(s): Catheter ablation as a surgical intervention .  Risks include but not limited to complete heart block, stroke, esophageal damage, nerve damage, bleeding, vascular damage, tamponade, perforation, MI, and death. The patient's history  has been reviewed, patient examined, no change in status, stable for surgery.  I have reviewed the patient's chart and labs.  Questions were answered to the patient's satisfaction.    Rhilee Currin Curt Bears, MD 11/23/2021 9:32 AM

## 2021-11-26 ENCOUNTER — Encounter (HOSPITAL_COMMUNITY): Payer: Self-pay | Admitting: Cardiology

## 2021-11-30 ENCOUNTER — Other Ambulatory Visit: Payer: Self-pay | Admitting: Cardiology

## 2021-11-30 ENCOUNTER — Other Ambulatory Visit (HOSPITAL_COMMUNITY): Payer: Self-pay | Admitting: Physician Assistant

## 2021-12-12 DIAGNOSIS — I4891 Unspecified atrial fibrillation: Secondary | ICD-10-CM | POA: Diagnosis not present

## 2021-12-12 DIAGNOSIS — E042 Nontoxic multinodular goiter: Secondary | ICD-10-CM | POA: Diagnosis not present

## 2021-12-12 DIAGNOSIS — E0590 Thyrotoxicosis, unspecified without thyrotoxic crisis or storm: Secondary | ICD-10-CM | POA: Diagnosis not present

## 2021-12-13 ENCOUNTER — Ambulatory Visit (HOSPITAL_COMMUNITY)
Admission: RE | Admit: 2021-12-13 | Discharge: 2021-12-13 | Disposition: A | Discharge status: Home / Self Care | Payer: PPO | Source: Ambulatory Visit | Attending: Cardiology | Admitting: Cardiology

## 2021-12-13 ENCOUNTER — Encounter (HOSPITAL_COMMUNITY): Payer: Self-pay | Admitting: Cardiology

## 2021-12-13 VITALS — BP 118/78 | HR 71 | Wt 176.4 lb

## 2021-12-13 DIAGNOSIS — I5033 Acute on chronic diastolic (congestive) heart failure: Secondary | ICD-10-CM | POA: Diagnosis not present

## 2021-12-13 DIAGNOSIS — I4819 Other persistent atrial fibrillation: Secondary | ICD-10-CM | POA: Diagnosis not present

## 2021-12-13 DIAGNOSIS — I5032 Chronic diastolic (congestive) heart failure: Secondary | ICD-10-CM | POA: Diagnosis not present

## 2021-12-13 DIAGNOSIS — E0520 Thyrotoxicosis with toxic multinodular goiter without thyrotoxic crisis or storm: Secondary | ICD-10-CM | POA: Diagnosis not present

## 2021-12-13 DIAGNOSIS — I2510 Atherosclerotic heart disease of native coronary artery without angina pectoris: Secondary | ICD-10-CM | POA: Diagnosis not present

## 2021-12-13 DIAGNOSIS — Z79899 Other long term (current) drug therapy: Secondary | ICD-10-CM | POA: Insufficient documentation

## 2021-12-13 DIAGNOSIS — E785 Hyperlipidemia, unspecified: Secondary | ICD-10-CM | POA: Diagnosis not present

## 2021-12-13 DIAGNOSIS — Z7901 Long term (current) use of anticoagulants: Secondary | ICD-10-CM | POA: Diagnosis not present

## 2021-12-13 LAB — CBC
HCT: 35.7 % — ABNORMAL LOW (ref 36.0–46.0)
Hemoglobin: 12.6 g/dL (ref 12.0–15.0)
MCH: 33 pg (ref 26.0–34.0)
MCHC: 35.3 g/dL (ref 30.0–36.0)
MCV: 93.5 fL (ref 80.0–100.0)
Platelets: 155 10*3/uL (ref 150–400)
RBC: 3.82 MIL/uL — ABNORMAL LOW (ref 3.87–5.11)
RDW: 13.5 % (ref 11.5–15.5)
WBC: 5.2 10*3/uL (ref 4.0–10.5)
nRBC: 0 % (ref 0.0–0.2)

## 2021-12-13 LAB — COMPREHENSIVE METABOLIC PANEL
ALT: 17 U/L (ref 0–44)
AST: 18 U/L (ref 15–41)
Albumin: 4 g/dL (ref 3.5–5.0)
Alkaline Phosphatase: 57 U/L (ref 38–126)
Anion gap: 9 (ref 5–15)
BUN: 22 mg/dL (ref 8–23)
CO2: 19 mmol/L — ABNORMAL LOW (ref 22–32)
Calcium: 9.6 mg/dL (ref 8.9–10.3)
Chloride: 110 mmol/L (ref 98–111)
Creatinine, Ser: 1.22 mg/dL — ABNORMAL HIGH (ref 0.44–1.00)
GFR, Estimated: 47 mL/min — ABNORMAL LOW (ref >60)
Glucose, Bld: 93 mg/dL (ref 70–99)
Potassium: 4.3 mmol/L (ref 3.5–5.1)
Sodium: 138 mmol/L (ref 135–145)
Total Bilirubin: 1.3 mg/dL — ABNORMAL HIGH (ref 0.3–1.2)
Total Protein: 6.8 g/dL (ref 6.5–8.1)

## 2021-12-13 LAB — LIPID PANEL
Cholesterol: 218 mg/dL — ABNORMAL HIGH (ref 0–200)
HDL: 56 mg/dL (ref >40)
LDL Cholesterol: 94 mg/dL (ref 0–99)
Total CHOL/HDL Ratio: 3.9 RATIO
Triglycerides: 341 mg/dL — ABNORMAL HIGH (ref <150)
VLDL: 68 mg/dL — ABNORMAL HIGH (ref 0–40)

## 2021-12-13 LAB — BRAIN NATRIURETIC PEPTIDE: B Natriuretic Peptide: 46.9 pg/mL (ref 0.0–100.0)

## 2021-12-13 NOTE — Patient Instructions (Signed)
There has been no changes to your medications.  Labs done today, your results will be available in MyChart, we will contact you for abnormal readings.  Your physician has requested that you have an echocardiogram. Echocardiography is a painless test that uses sound waves to create images of your heart. It provides your doctor with information about the size and shape of your heart and how well your heart's chambers and valves are working. This procedure takes approximately one hour. There are no restrictions for this procedure. Please do NOT wear cologne, perfume, aftershave, or lotions (deodorant is allowed). Please arrive 15 minutes prior to your appointment time.  Your physician recommends that you schedule a follow-up appointment in: 3 months  If you have any questions or concerns before your next appointment please send [place] a message through Angie or call our office at [phone].    TO LEAVE A MESSAGE FOR THE NURSE SELECT OPTION 2, PLEASE LEAVE A MESSAGE INCLUDING: YOUR NAME DATE OF BIRTH CALL BACK NUMBER REASON FOR CALL**this is important as we prioritize the call backs  YOU WILL RECEIVE A CALL BACK THE SAME DAY AS LONG AS YOU CALL BEFORE 4:00 PM  At the [facility], you and your health needs are our priority. As part of our continuing mission to provide you with exceptional heart care, we have created designated Provider Care Teams. These Care Teams include your primary Cardiologist (physician) and Advanced Practice Providers (APPs- Physician Assistants and Nurse Practitioners) who all work together to provide you with the care you need, when you need it.   You may see any of the following providers on your designated Care Team at your next follow up: Dr Glori Bickers Dr [name] Dr. Roxana Hires, NP Lyda Jester, [place] [facility] [place], [place] Forestine Na, NP Audry Riles, PharmD   Please be sure to bring in all your  medications bottles to every appointment.

## 2021-12-14 ENCOUNTER — Other Ambulatory Visit: Payer: Self-pay | Admitting: Cardiovascular Disease

## 2021-12-14 DIAGNOSIS — I4819 Other persistent atrial fibrillation: Secondary | ICD-10-CM

## 2021-12-14 NOTE — Progress Notes (Signed)
ADVANCED HF CLINIC NOTE  Primary Care: [name], DO HF Cardiologist: Dr. Aundra Dubin  HPI: Anna Marquez is a 71 y.o.with history of diastolic CHF, paroxysmal atrial fibrillation, and hyperthyroidism.  She was was admitted with atrial fibrillation/RVR and acute on chronic diastolic CHF. Patient was on Tikosyn several years ago for AF then had ablation in 2/21 by Dr. Rayann Heman.  Tikosyn was later stopped.  Recently, she has re-developed episodes of atrial fibrillation.  She also, of noted, has been found to have hyperthyroidism likely related to multinodular goiter.  She was admitted in 5/23 with acute respiratory failure in setting of atrial fibrillation and CHF, there was some question of PNA.  She spontaneously converted to NSR.  At office visit subsequently on 06/19/21, she remained in NSR.     She was admitted  07/03/21 with atrial fibrillation with RVR and developed progressive respiratory distress. Amiodarone gtt was started and she was given Lasix, but ultimately required intubation. She developed hypotension and required NE + vasopressin with elevated lactate, and AKI. TEE showed normal LV size with EF 60-65%, mild RV dysfunction, severe LAE with moderate-severe central MR (likely atrial functional MR), no LA appendage thrombus. DCCV was attempted x 2, both times she went back into NSR but promptly reverted to AF. Eventual successful DCCV to NSR on 07/09/21. Drips weaned off, and started on oral amiodarone taper. GDMT started and she was discharged home, weight 162 lbs.  Follow up in AF clinic 07/16/21, HR 123 in Afib. Felt AF paroxysmal and needed more time for amiodarone load. Toprol started.   Today she returns for HF follow up. She is in NSR and feels like she has been in normal rhythm since AF ablation.  She has a new puppy and has been walking it.  No significant exertional dyspnea.  No chest pain. She takes Lasix only rarely.  She follows with endocrinology and is on methimazole for  hyperthyroidism.  She is still on amiodarone. Weight up 7 lbs.   ECG (personally reviewed): NSR, Nonspecific T wave flattening  Labs (7/23): K 4.8, creatinine 1.47, normal LFTs Labs (8/23): K 3.7, creatinine 1.05 Labs (10/23): K 3.7, creatinine 1.11  PMH: 1. Cholelithiasis 2. Diverticulosis 3. H/o COVID-19 4. Hyperthyroidism: likely related to amiodarone.  5. Atrial fibrillation: Paroxysmal. - Atrial fibrillation ablation 2/21.  - Atrial fibrillation ablation in 11/23.  6. Chronic diastolic CHF:  TEE (9/47) with normal LV size with EF 60-65%, mild RV dysfunction, severe LAE with moderate-severe central MR (likely atrial functional MR), no LA appendage thrombus. 7. CAD: CTA for pulmonary veins showed calcium score 134, 72nd percentile.    Current Outpatient Medications  Medication Sig Dispense Refill   acetaminophen (TYLENOL) 500 MG tablet Take 1,000 mg by mouth every 6 (six) hours as needed for moderate pain or headache.     albuterol (VENTOLIN HFA) 108 (90 Base) MCG/ACT inhaler INHALE 1 TO 2 PUFFS INTO THE LUNGS EVERY 6 HOURS AS NEEDED FOR WHEEZING OR SHORTNESS OF BREATH. 8.5 g 3   amiodarone (PACERONE) 200 MG tablet TAKE 1 TABLET (200 MG TOTAL) BY MOUTH DAILY. 30 tablet 3   Ascorbic Acid (VITAMIN C) 1000 MG tablet Take 1,000 mg by mouth daily.     Cholecalciferol (VITAMIN D) 50 MCG (2000 UT) tablet Take 2,000 Units by mouth daily.     dapagliflozin propanediol (FARXIGA) 10 MG TABS tablet Take 1 tablet (10 mg total) by mouth daily. 30 tablet 5   ELIQUIS 5 MG TABS tablet TAKE 1  TABLET BY MOUTH 2 TIMES DAILY. 60 tablet 5   ferrous sulfate 325 (65 FE) MG tablet Take 325 mg by mouth every other day.     fluticasone (FLONASE) 50 MCG/ACT nasal spray PLACE 1 SPRAY INTO BOTH NOSTRILS AS NEEDED FOR ALLERGIES OR RHINITIS. 16 g 6   furosemide (LASIX) 20 MG tablet Take 1 tablet (20 mg total) by mouth as needed for edema or fluid. 90 tablet 3   loratadine (CLARITIN) 10 MG tablet Take 10 mg by  mouth daily.      methimazole (TAPAZOLE) 10 MG tablet Take 10 mg by mouth daily.     metoprolol succinate (TOPROL-XL) 100 MG 24 hr tablet TAKE 1 TABLET (100 MG TOTAL) BY MOUTH DAILY. TAKE WITH OR IMMEDIATELY FOLLOWING A MEAL. 90 tablet 2   neomycin-polymyxin b-dexamethasone (MAXITROL) 3.5-10000-0.1 OINT Place 1 Application into the left eye 4 (four) times daily.     Polyethyl Glycol-Propyl Glycol 0.4-0.3 % SOLN Place 2 drops into the left eye every 2 (two) hours.     potassium chloride (KLOR-CON) 10 MEQ tablet Take 10 mEq by mouth daily as needed (when taking furosemide).     Probiotic Product (PROBIOTIC PO) Take 1 capsule by mouth daily.     spironolactone (ALDACTONE) 25 MG tablet Take 0.5 tablets (12.5 mg total) by mouth daily. 15 tablet 11   trimethoprim-polymyxin b (POLYTRIM) ophthalmic solution Place 1 drop into the left eye in the morning, at noon, in the evening, and at bedtime.     valACYclovir (VALTREX) 1000 MG tablet Take 1,000 mg by mouth every morning.     vitamin E 180 MG (400 UNITS) capsule Take 400 Units by mouth daily.     ZINC CITRATE PO Take 22 mg by mouth daily.     moxifloxacin (VIGAMOX) 0.5 % ophthalmic solution Place 1 drop into the left eye 4 times daily. (Patient not taking: Reported on 12/13/2021)     pantoprazole (PROTONIX) 40 MG tablet TAKE 1 TABLET (40 MG TOTAL) BY MOUTH DAILY. 90 tablet 4   No current facility-administered medications for this encounter.   Allergies  Allergen Reactions   Erythromycin     Upset stomach   Nsaids Other (See Comments)    Has a history of bleeding ulcers, tolerates aspirin    Penicillins Hives and Shortness Of Breath    Did it involve swelling of the face/tongue/throat, SOB, or low BP? Yes Did it involve sudden or severe rash/hives, skin peeling, or any reaction on the inside of your mouth or nose? No Did you need to seek medical attention at a hospital or doctor's office? No When did it last happen?      45 years If all above  answers are "NO", may proceed with cephalosporin use.    Social History   Socioeconomic History   Marital status: Divorced    Spouse name: Not on file   Number of children: 2   Years of education: 12th grade   Highest education level: High school graduate  Occupational History   Occupation: Retired,  Tobacco Use   Smoking status: Never   Smokeless tobacco: Never   Tobacco comments:    Never smoke 07/16/21  Vaping Use   Vaping Use: Never used  Substance and Sexual Activity   Alcohol use: Never    Alcohol/week: 0.0 standard drinks of alcohol   Drug use: No   Sexual activity: Yes    Birth control/protection: Post-menopausal    Comment: 1st intercourse 71 yo-Fewer than 5  partners  Other Topics Concern   Not on file  Social History Narrative   Lives along with her dog. She enjoys croteching, crafts, making wreaths, reading and gardening in her free time.    Social Determinants of Health   Financial Resource Strain: Low Risk  (03/26/2021)   Overall Financial Resource Strain (CARDIA)    Difficulty of Paying Living Expenses: Not hard at all  Food Insecurity: No Food Insecurity (03/26/2021)   Hunger Vital Sign    Worried About Running Out of Food in the Last Year: Never true    Ran Out of Food in the Last Year: Never true  Transportation Needs: No Transportation Needs (03/26/2021)   PRAPARE - Hydrologist (Medical): No    Lack of Transportation (Non-Medical): No  Physical Activity: Sufficiently Active (03/26/2021)   Exercise Vital Sign    Days of Exercise per Week: 3 days    Minutes of Exercise per Session: 120 min  Stress: No Stress Concern Present (03/26/2021)   [place]    Feeling of Stress : Not at all  Social Connections: Moderately Isolated (03/26/2021)   Social Connection and Isolation Panel [NHANES]    Frequency of Communication with Friends and Family: More than three times  a week    Frequency of Social Gatherings with Friends and Family: More than three times a week    Attends Religious Services: More than 4 times per year    Active Member of [employer] or Organizations: No    Attends Archivist Meetings: Never    Marital Status: Divorced  Human resources officer Violence: Not At Risk (03/26/2021)   Humiliation, Afraid, Rape, and Kick questionnaire    Fear of Current or Ex-Partner: No    Emotionally Abused: No    Physically Abused: No    Sexually Abused: No   Family History  Problem Relation Age of Onset   Cancer Father        prostate   Breast cancer Paternal Aunt 15   Diabetes Maternal Grandmother    Heart disease Maternal Grandmother    Heart disease Maternal Grandfather    Melanoma Paternal Aunt    Neuropathy Mother    Congestive Heart Failure Mother    Thyroid disease Daughter    BP 118/78   Pulse 71   Wt 80 kg (176 lb 6.4 oz)   SpO2 94%   BMI 30.28 kg/m   Wt Readings from Last 3 Encounters:  12/13/21 80 kg (176 lb 6.4 oz)  11/23/21 77.1 kg (170 lb)  10/01/21 78.5 kg (173 lb)   PHYSICAL EXAM: General: NAD Neck: No JVD, no thyromegaly or thyroid nodule.  Lungs: Clear to auscultation bilaterally with normal respiratory effort. CV: Nondisplaced PMI.  Heart regular S1/S2, no S3/S4, no murmur.  No peripheral edema.  No carotid bruit.  Normal pedal pulses.  Abdomen: Soft, nontender, no hepatosplenomegaly, no distention.  Skin: Intact without lesions or rashes.  Neurologic: Alert and oriented x 3.  Psych: Normal affect. Extremities: No clubbing or cyanosis.  HEENT: Normal.   ASSESSMENT & PLAN: 1. Chronic diastolic CHF: TEE in 7/37 showed normal LV size with EF 60-65%, mild RV dysfunction, severe LAE with moderate-severe central MR (likely atrial functional MR). Suspect CHF exacerbation was triggered by atrial fibrillation, also mitral regurgitation may have been worsened by atrial fibrillation.  She had a recent prior admission with CHF in  the setting of atrial fibrillation, consistent with  flash pulmonary edema. NYHA class II, not volume overloaded and in NSR.  - Need to keep her in NSR as AF appears to precipitate CHF.  - Continue spironolactone 12.5 mg daily. BMET/BNP today.  - Continue Toprol XL 100 mg daily.  - Continue Farxiga 10 mg daily.  - Continue Lasix 20 mg PRN. - Ischemia as a contributor to flash pulmonary edema is possible though less likely. She has not been cathed.  - Arrange for echo at followup. 2. Atrial fibrillation: Persistent.  She was on Tikosyn in the past then had AF ablation in 2/21, Tikosyn stopped after this.  She was not on an antiarrhythmic prior to admission.   She was admitted in 5/23 with AF and CHF exacerbation. She has also been noted to have developed hyperthyroidism prior to this admission, this can help trigger AF. Need to keep in NSR due to worsening of HF in AF.  DCCV 07/05/21 failed, but she was still on low dose norepinephrine. DCCV 07/09/21 successful. Amiodarone is not an ideal medication for her (she already has hyperthyroidism).  Would like to avoid this long-term. She had re-do atrial fibrillation ablation with Dr. Curt Bears 11/23/21. She is in NSR today. - Continue amiodarone, hopefully will stay in NSR post-ablation and can eventually stop.  Check LFTs, TSH.  She will need a regular eye exam while on amiodarone.  - Continue Eliquis 5 mg bid. CBC today.  3. Hyperthyroidism: She had this prior to 5/23 admission (prior to amiodarone use).  Has multinodular goiter. She sees endocrinology.  - Continue methimazole, endocrinology following.  - As above, amiodarone is not ideal but think necessary for the time being.  4. CAD: borderline calcium score, 72nd percentile.  - Check lipids, consider statin.   Followup 4 months with echo.   [name] 12/14/2021

## 2021-12-14 NOTE — Telephone Encounter (Signed)
Eliquis '5mg'$  refill request received. Patient is 71 years old, weight-80kg, Crea-1.22 on 12/13/2021, Diagnosis-Afib, and last seen by Dr. Aundra Dubin on 12/13/2021. Dose is appropriate based on dosing criteria. Will send in refill to requested pharmacy.

## 2021-12-18 ENCOUNTER — Telehealth (HOSPITAL_COMMUNITY): Payer: Self-pay | Admitting: Cardiology

## 2021-12-18 MED ORDER — ROSUVASTATIN CALCIUM 10 MG PO TABS: 10.0000 mg | ORAL_TABLET | Freq: Every day | ORAL | 3 refills | Status: DC

## 2021-12-18 NOTE — Telephone Encounter (Signed)
-----   Message from Larey Dresser, MD sent at 12/16/2021  9:54 PM EST ----- With CAD on calcium score scan, would recommend Crestor 10 mg daily with lipids/LFTs in 2 months.

## 2021-12-18 NOTE — Telephone Encounter (Signed)
Patient called.  Patient aware.  

## 2021-12-19 ENCOUNTER — Encounter: Payer: Self-pay | Admitting: Family Medicine

## 2021-12-19 ENCOUNTER — Ambulatory Visit (INDEPENDENT_AMBULATORY_CARE_PROVIDER_SITE_OTHER): Payer: PPO | Admitting: Family Medicine

## 2021-12-19 VITALS — BP 113/72 | HR 72 | Ht 64.0 in | Wt 173.0 lb

## 2021-12-19 DIAGNOSIS — R09A2 Foreign body sensation, throat: Secondary | ICD-10-CM | POA: Diagnosis not present

## 2021-12-19 DIAGNOSIS — E0590 Thyrotoxicosis, unspecified without thyrotoxic crisis or storm: Secondary | ICD-10-CM

## 2021-12-19 DIAGNOSIS — I4891 Unspecified atrial fibrillation: Secondary | ICD-10-CM | POA: Diagnosis not present

## 2021-12-19 DIAGNOSIS — B6013 Keratoconjunctivitis due to Acanthamoeba: Secondary | ICD-10-CM

## 2021-12-19 NOTE — Assessment & Plan Note (Signed)
Followed by [facility] ophthalmology.  Stable at this time.  Mild swelling of the left eye noted today.

## 2021-12-19 NOTE — Assessment & Plan Note (Signed)
Methimazole dose reduced to 5 mg daily by endocrinology.  She will continue management per their recommendations.

## 2021-12-19 NOTE — Patient Instructions (Signed)
Let me know if sensation in throat is not improving with saline rinses.  See me again in about 3-4 months.

## 2021-12-19 NOTE — Assessment & Plan Note (Signed)
No abnormalities noted on exam.  May be related to postnasal drainage due to her increased use of eyedrops.  We did discuss potential for thrush contributing and if not resolving we could do a trial of swish and swallow nystatin.  She will keep me updated on how she is feeling with this.

## 2021-12-19 NOTE — Assessment & Plan Note (Signed)
She had recent ablation for A-fib.  Continues on amiodarone and metoprolol.  She has remained in normal sinus rhythm.  Denies symptoms at this time.

## 2021-12-19 NOTE — Progress Notes (Signed)
Anna Marquez - 71 y.o. female MRN [id number]  Date of birth: 22-Aug-1950  Subjective Chief Complaint  Patient presents with   Hospitalization Follow-up    HPI Anna Marquez is a 71 y.o. female here today for follow-up visit.  She underwent ablation for atrial fibrillation last month.  She has been doing well since that time.  She continues on amiodarone and metoprolol.  She is anticoagulated with Eliquis.  She denies chest pain, palpitations, dizziness or increased fatigue.  She has had suppressed TSH, currently on methimazole.  She was seen by endocrinology recently and methimazole was reduced to 5 mg daily.  She continues to see [facility] for treatment of ongoing acanthamoeba infection.  This is slowly healing.  She does report since sensation of tightness in her neck.  This comes and goes.  Denies pain with swallowing, dyspnea or nausea.  She reports that her endocrinologist did not feel that this was related to her thyroid.  No significant reflux at this time.  She is on Protonix.  ROS:  A comprehensive ROS was completed and negative except as noted per HPI  Allergies  Allergen Reactions   Erythromycin     Upset stomach   Nsaids Other (See Comments)    Has a history of bleeding ulcers, tolerates aspirin    Penicillins Hives and Shortness Of Breath    Did it involve swelling of the face/tongue/throat, SOB, or low BP? Yes Did it involve sudden or severe rash/hives, skin peeling, or any reaction on the inside of your mouth or nose? No Did you need to seek medical attention at a hospital or doctor's office? No When did it last happen?      45 years If all above answers are "NO", may proceed with cephalosporin use.     Past Medical History:  Diagnosis Date   Adenomatous colon polyp 2003   Cholelithiasis    COVID    Diverticulosis of colon    Gastric polyps    GERD (gastroesophageal reflux disease)    Helicobacter pylori (H. pylori)    Hiatal hernia    Hypertension     Mitral valve prolapse    Nephrolithiasis    Osteoarthritis    Osteopenia 03/2017   T score -1.5 FRAX 8.9% / 0.9%.  Stable from prior DEXA 2016   Persistent atrial fibrillation (HCC)    TGA (transient global amnesia)     Past Surgical History:  Procedure Laterality Date   ANTERIOR AND POSTERIOR REPAIR N/A 06/14/2014   Procedure: ANTERIOR (CYSTOCELE) AND POSTERIOR REPAIR (RECTOCELE);  Surgeon: Anastasio Auerbach, MD;  Location: [place] [facility];  Service: Gynecology;  Laterality: N/A;   ATRIAL FIBRILLATION ABLATION N/A 02/11/2019   Procedure: ATRIAL FIBRILLATION ABLATION;  Surgeon: Thompson Grayer, MD;  Location: [place] [facility];  Service: Cardiovascular;  Laterality: N/A;   ATRIAL FIBRILLATION ABLATION N/A 11/23/2021   Procedure: ATRIAL FIBRILLATION ABLATION;  Surgeon: Constance Haw, MD;  Location: [place] [facility];  Service: Cardiovascular;  Laterality: N/A;   CARDIOVERSION N/A 01/15/2019   Procedure: CARDIOVERSION;  Surgeon: Fay Records, MD;  Location: [facility];  Service: Cardiovascular;  Laterality: N/A;   CARDIOVERSION N/A 07/09/2021   Procedure: CARDIOVERSION;  Surgeon: Larey Dresser, MD;  Location: [facility] ENDOSCOPY;  Service: Cardiovascular;  Laterality: N/A;   CHOLECYSTECTOMY     kidney stone removal     x2   VAGINAL HYSTERECTOMY N/A 06/14/2014   Procedure: HYSTERECTOMY VAGINAL;  Surgeon: Anastasio Auerbach, MD;  Location: [place] [facility];  Service: Gynecology;  Laterality: N/A;    Social History   Socioeconomic History   Marital status: Divorced    Spouse name: Not on file   Number of children: 2   Years of education: 12th grade   Highest education level: High school graduate  Occupational History   Occupation: Retired,  Tobacco Use   Smoking status: Never   Smokeless tobacco: Never   Tobacco comments:    Never smoke 07/16/21  Vaping Use   Vaping Use: Never used  Substance and Sexual Activity   Alcohol use: Never    Alcohol/week: 0.0 standard drinks of alcohol   Drug use:  No   Sexual activity: Yes    Birth control/protection: Post-menopausal    Comment: 1st intercourse 71 yo-Fewer than 5 partners  Other Topics Concern   Not on file  Social History Narrative   Lives along with her dog. She enjoys croteching, crafts, making wreaths, reading and gardening in her free time.    Social Determinants of Health   Financial Resource Strain: Low Risk  (03/26/2021)   Overall Financial Resource Strain (CARDIA)    Difficulty of Paying Living Expenses: Not hard at all  Food Insecurity: No Food Insecurity (03/26/2021)   Hunger Vital Sign    Worried About Running Out of Food in the Last Year: Never true    Ran Out of Food in the Last Year: Never true  Transportation Needs: No Transportation Needs (03/26/2021)   PRAPARE - Hydrologist (Medical): No    Lack of Transportation (Non-Medical): No  Physical Activity: Sufficiently Active (03/26/2021)   Exercise Vital Sign    Days of Exercise per Week: 3 days    Minutes of Exercise per Session: 120 min  Stress: No Stress Concern Present (03/26/2021)   [place]    Feeling of Stress : Not at all  Social Connections: Moderately Isolated (03/26/2021)   Social Connection and Isolation Panel [NHANES]    Frequency of Communication with Friends and Family: More than three times a week    Frequency of Social Gatherings with Friends and Family: More than three times a week    Attends Religious Services: More than 4 times per year    Active Member of [employer] or Organizations: No    Attends Archivist Meetings: Never    Marital Status: Divorced    Family History  Problem Relation Age of Onset   Cancer Father        prostate   Breast cancer Paternal Aunt 54   Diabetes Maternal Grandmother    Heart disease Maternal Grandmother    Heart disease Maternal Grandfather    Melanoma Paternal Aunt    Neuropathy Mother    Congestive  Heart Failure Mother    Thyroid disease Daughter     Health Maintenance  Topic Date Due   COVID-19 Vaccine (3 - Pfizer risk series) 02/07/2022 (Originally 04/21/2019)   Zoster Vaccines- Shingrix (1 of 2) 03/20/2022 (Originally 02/02/1969)   Medicare Annual Wellness ([place])  03/27/2022   MAMMOGRAM  07/27/2022   COLONOSCOPY (Pts 45-41yr Insurance coverage will need to be confirmed)  03/13/2023   DTaP/Tdap/Td (3 - Tdap) 10/29/2027   Pneumonia Vaccine 71 Years old  Completed   INFLUENZA VACCINE  Completed   DEXA SCAN  Completed   Hepatitis C Screening  Completed   HPV VACCINES  Aged Out     ----------------------------------------------------------------------------------------------------------------------------------------------------------------------------------------------------------------- Physical Exam  BP 113/72 (BP Location: Right Arm, Patient Position: Sitting, Cuff Size: Normal)   Pulse 72   Ht '5\' 4"'$  (1.626 m)   Wt 173 lb (78.5 kg)   SpO2 98%   BMI 29.70 kg/m   Physical Exam Constitutional:      Appearance: Normal appearance.  HENT:     Head: Normocephalic and atraumatic.  Eyes:     General: No scleral icterus. Cardiovascular:     Rate and Rhythm: Normal rate and regular rhythm.  Pulmonary:     Effort: Pulmonary effort is normal.     Breath sounds: Normal breath sounds.  Musculoskeletal:     Cervical back: Neck supple.  Lymphadenopathy:     Cervical: No cervical adenopathy.  Neurological:     Mental Status: She is alert.  Psychiatric:        Mood and Affect: Mood normal.        Behavior: Behavior normal.     ------------------------------------------------------------------------------------------------------------------------------------------------------------------------------------------------------------------- Assessment and Plan  Atrial fibrillation with RVR ([place]) She had recent ablation for A-fib.  Continues on amiodarone and metoprolol.  She has  remained in normal sinus rhythm.  Denies symptoms at this time.  Hyperthyroidism Methimazole dose reduced to 5 mg daily by endocrinology.  She will continue management per their recommendations.  Acanthamoeba keratitis Followed by [facility] ophthalmology.  Stable at this time.  Mild swelling of the left eye noted today.  Globus sensation No abnormalities noted on exam.  May be related to postnasal drainage due to her increased use of eyedrops.  We did discuss potential for thrush contributing and if not resolving we could do a trial of swish and swallow nystatin.  She will keep me updated on how she is feeling with this.   No orders of the defined types were placed in this encounter.   Return in about 3 months (around 03/20/2022) for HTN.    This visit occurred during the SARS-CoV-2 public health emergency.  Safety protocols were in place, including screening questions prior to the visit, additional usage of staff PPE, and extensive cleaning of exam room while observing appropriate contact time as indicated for disinfecting solutions.

## 2021-12-21 DIAGNOSIS — B6013 Keratoconjunctivitis due to Acanthamoeba: Secondary | ICD-10-CM | POA: Diagnosis not present

## 2021-12-25 ENCOUNTER — Ambulatory Visit (HOSPITAL_COMMUNITY): Payer: PPO | Admitting: Nurse Practitioner

## 2021-12-26 ENCOUNTER — Ambulatory Visit (HOSPITAL_COMMUNITY): Payer: PPO | Admitting: Nurse Practitioner

## 2021-12-28 ENCOUNTER — Ambulatory Visit (HOSPITAL_COMMUNITY)
Admission: RE | Admit: 2021-12-28 | Discharge: 2021-12-28 | Disposition: A | Discharge status: Home / Self Care | Payer: PPO | Source: Ambulatory Visit | Attending: Nurse Practitioner | Admitting: Nurse Practitioner

## 2021-12-28 ENCOUNTER — Encounter (HOSPITAL_COMMUNITY): Payer: Self-pay | Admitting: Nurse Practitioner

## 2021-12-28 VITALS — BP 126/80 | HR 72 | Ht 64.0 in | Wt 178.8 lb

## 2021-12-28 DIAGNOSIS — D6869 Other thrombophilia: Secondary | ICD-10-CM | POA: Diagnosis not present

## 2021-12-28 DIAGNOSIS — I110 Hypertensive heart disease with heart failure: Secondary | ICD-10-CM | POA: Diagnosis not present

## 2021-12-28 DIAGNOSIS — Z7901 Long term (current) use of anticoagulants: Secondary | ICD-10-CM | POA: Insufficient documentation

## 2021-12-28 DIAGNOSIS — I4892 Unspecified atrial flutter: Secondary | ICD-10-CM | POA: Diagnosis not present

## 2021-12-28 DIAGNOSIS — I4819 Other persistent atrial fibrillation: Secondary | ICD-10-CM | POA: Diagnosis not present

## 2021-12-28 DIAGNOSIS — I5032 Chronic diastolic (congestive) heart failure: Secondary | ICD-10-CM | POA: Insufficient documentation

## 2021-12-28 DIAGNOSIS — I341 Nonrheumatic mitral (valve) prolapse: Secondary | ICD-10-CM | POA: Insufficient documentation

## 2021-12-28 NOTE — Progress Notes (Addendum)
Primary Care Physician: [name], DO Referring Physician: ER f/u Cardiologist: Dr. Acie Fredrickson  Primary EP: Dr Rayann Heman   Anna Marquez is a 71 y.o. female with a h/o paroxysmal afib first dx March 2018. She was originally  seen  in the afib office for having afib with RVR  in the ER,  and receiving successful cardioversion 11/05/18. Unfortunately, she was back in rapid afib at 141 bpm.   She had an echo and a ETT in 2018. EKG in rhythm in 2018 showed SR with a first degree av block.  In the past she would take an extra corgard to get back in rhythm. No outstanding triggers.   Return to afib clinic 12/16/18.  Repeat Echo showed reduced EF at 45-50% and Zio patch showed continuous afib with  HR control  at 96 bpm, max at 218 ms, minimum at 54 bpm.  Discussed antiarrythmic's with pt. With the reduced EF and evidence of first degree AV block on a prior EKG in rhythm, it would be by guidelines to avoid Multaq  and flecainide. Discussed with pt use of  tikosyn and she wants to pursue.   Back in afib, clinic for Tikosyn admit, 12/21/18. She states that her afib has been running over 100 bpm for the last   several days. It is making her more fatigued.  In the clinic v rates in the 120's. She  is getting more symptomatic with it. Her weight is stable.   F/u in afib clinic, 01/04/19.  She is in afib this am but has felt so much better and was surprised  that she was out of rhythm. She went in and out while in the hospital  but did break with Tikosyn. Did not require DCCV. The dose had to be reduced to 250 mcg  because the qtc did prolong. Continues  on eliquis with a CHA2DS2VASc score of 3.  F/u in afib clinic, 01/11/19. EKG shows afib. Her records at home indicate that her HR's have been elevated so I suspect that she has been in afib since I last saw her last week. The shortness of breath improved after hospitalization for Tikosyn but now  has  returned.  Review os her TSH over time shows low TSH readings  but T4 when rechecked in November  was in normal range.   F/u in afib clinic, 01/22/19. Had successful cardioversion but yesterday went back out of rhythm. She is feeling better right now than right before DCCV.EKG shows atrial flutter at 107 bpm. She  is interested in having afib ablation. She has an appointment with Dr. Rayann Heman to further discuss on 1/28.Contiues on Tikosyn and eliquis with CHA2DS2VASc score of 3.   F/u in afib clinic, 03/11/19. She is now one month qfib ablation.. She has been staying in SR on dofetilide. She had been doing great unitl she got her covid shot one week ago. Since this she has had elevated BP and H/A. No afib . No swallowing or groin issues.   Follow up in the AF clinic 07/16/21. She was admitted in 5/23 with acute respiratory failure in setting of atrial fibrillation and CHF, there was some question of PNA.  She spontaneously converted to NSR.  At office visit subsequently on 06/19/21, she remained in NSR. She was admitted again on 6/27 with dyspnea.  She reported 2 days of dyspnea prior to admission.  In ER, she was in atrial fibrillation with RVR.  She developed progressive respiratory distress.  Amiodarone gtt  was started and she was given Lasix.  She was ultimately intubated. DCCV was attempted x 2, both times she went back into NSR but promptly reverted to AF. She continued to load on amiodarone and had DCCV again on 07/09/21 which was successful. She reports that she went back into afib on 7/5, then back into SR on 7/7, then afib on 7/8. Appears to be paroxysmal at this point. Overall, she does feel better with no symptoms of fluid retention. She is fatigued.   F/u in afib clinic, 08/15/21. She had Toprol increased by 100 mg daily when pt saw Dr. Curt Bears 7/25 for afib with RVR. She then had hypotension over the weekend and metoprolol was decreased form 125 mg daily to 100 mg daily. She was told to hold spironolactone as well. She returned to SR at some point after that . Today  in the clinic, she has returned to SR at 75 bpm. BP is 132/78. She has returned to taking spironolactone but checks her BP in  the am before she take her am pills. She feels improved today. She is scheduled to have a redo ablation in November. She is currenlty taking amiodarone at 200 mg daily. She also continues on daily lasix. Weight is stable. Weighing daily. Limiting fluids to 2L and watching sodium intake.   F/u 12/28/21. She had afib ablation 11/23/21. She has not had any afib. Remains on amiodarone. No swallowing or groin issues. .   Today, she denies symptoms of palpitations, chest pain, shortness of breath, orthopnea, PND, lower extremity edema, dizziness, presyncope, syncope, or neurologic sequela. The patient is tolerating medications without difficulties and is otherwise without complaint today.   Past Medical History:  Diagnosis Date   Adenomatous colon polyp 2003   Cholelithiasis    COVID    Diverticulosis of colon    Gastric polyps    GERD (gastroesophageal reflux disease)    Helicobacter pylori (H. pylori)    Hiatal hernia    Hypertension    Mitral valve prolapse    Nephrolithiasis    Osteoarthritis    Osteopenia 03/2017   T score -1.5 FRAX 8.9% / 0.9%.  Stable from prior DEXA 2016   Persistent atrial fibrillation (HCC)    TGA (transient global amnesia)    Past Surgical History:  Procedure Laterality Date   ANTERIOR AND POSTERIOR REPAIR N/A 06/14/2014   Procedure: ANTERIOR (CYSTOCELE) AND POSTERIOR REPAIR (RECTOCELE);  Surgeon: Anastasio Auerbach, MD;  Location: [facility];  Service: Gynecology;  Laterality: N/A;   ATRIAL FIBRILLATION ABLATION N/A 02/11/2019   Procedure: ATRIAL FIBRILLATION ABLATION;  Surgeon: Thompson Grayer, MD;  Location: [facility];  Service: Cardiovascular;  Laterality: N/A;   ATRIAL FIBRILLATION ABLATION N/A 11/23/2021   Procedure: ATRIAL FIBRILLATION ABLATION;  Surgeon: Constance Haw, MD;  Location: [facility];  Service:  Cardiovascular;  Laterality: N/A;   CARDIOVERSION N/A 01/15/2019   Procedure: CARDIOVERSION;  Surgeon: Fay Records, MD;  Location: [facility];  Service: Cardiovascular;  Laterality: N/A;   CARDIOVERSION N/A 07/09/2021   Procedure: CARDIOVERSION;  Surgeon: Larey Dresser, MD;  Location: [facility];  Service: Cardiovascular;  Laterality: N/A;   CHOLECYSTECTOMY     kidney stone removal     x2   VAGINAL HYSTERECTOMY N/A 06/14/2014   Procedure: HYSTERECTOMY VAGINAL;  Surgeon: Anastasio Auerbach, MD;  Location: [facility];  Service: Gynecology;  Laterality: N/A;    Current Outpatient Medications  Medication Sig Dispense Refill   acetaminophen (TYLENOL)  500 MG tablet Take 1,000 mg by mouth every 6 (six) hours as needed for moderate pain or headache.     albuterol (VENTOLIN HFA) 108 (90 Base) MCG/ACT inhaler INHALE 1 TO 2 PUFFS INTO THE LUNGS EVERY 6 HOURS AS NEEDED FOR WHEEZING OR SHORTNESS OF BREATH. 8.5 g 3   amiodarone (PACERONE) 200 MG tablet TAKE 1 TABLET (200 MG TOTAL) BY MOUTH DAILY. 30 tablet 3   Ascorbic Acid (VITAMIN C) 1000 MG tablet Take 1,000 mg by mouth daily.     Carboxymethylcellulose Sodium (EYE DROPS OP) Place 1 drop in the left eye every other hour- CHLORHEXIDINE, PRESERVED, 0.02 % OPHTHALMIC SOLUTION     Cholecalciferol (VITAMIN D) 50 MCG (2000 UT) tablet Take 2,000 Units by mouth daily.     dapagliflozin propanediol (FARXIGA) 10 MG TABS tablet Take 1 tablet (10 mg total) by mouth daily. 30 tablet 5   ELIQUIS 5 MG TABS tablet TAKE 1 TABLET BY MOUTH 2 TIMES DAILY. 60 tablet 5   ferrous sulfate 325 (65 FE) MG tablet Take 325 mg by mouth every other day.     fluticasone (FLONASE) 50 MCG/ACT nasal spray PLACE 1 SPRAY INTO BOTH NOSTRILS AS NEEDED FOR ALLERGIES OR RHINITIS. 16 g 6   furosemide (LASIX) 20 MG tablet Take 1 tablet (20 mg total) by mouth as needed for edema or fluid. 90 tablet 3   loratadine (CLARITIN) 10 MG tablet Take 10 mg by mouth daily.      methimazole (TAPAZOLE) 10  MG tablet Take 10 mg by mouth daily.     metoprolol succinate (TOPROL-XL) 100 MG 24 hr tablet TAKE 1 TABLET (100 MG TOTAL) BY MOUTH DAILY. TAKE WITH OR IMMEDIATELY FOLLOWING A MEAL. 90 tablet 2   neomycin-polymyxin b-dexamethasone (MAXITROL) 3.5-10000-0.1 OINT Place 1 Application into the left eye at bedtime.     pantoprazole (PROTONIX) 40 MG tablet TAKE 1 TABLET (40 MG TOTAL) BY MOUTH DAILY. 90 tablet 4   Polyethyl Glycol-Propyl Glycol 0.4-0.3 % SOLN Place 2 drops into the left eye every 2 (two) hours.     potassium chloride (KLOR-CON) 10 MEQ tablet Take 10 mEq by mouth daily as needed (when taking furosemide).     Probiotic Product (PROBIOTIC PO) Take 1 capsule by mouth daily.     rosuvastatin (CRESTOR) 10 MG tablet Take 1 tablet (10 mg total) by mouth daily. 90 tablet 3   spironolactone (ALDACTONE) 25 MG tablet Take 0.5 tablets (12.5 mg total) by mouth daily. 15 tablet 11   trimethoprim-polymyxin b (POLYTRIM) ophthalmic solution Place 1 drop into the left eye in the morning, at noon, in the evening, and at bedtime.     valACYclovir (VALTREX) 1000 MG tablet Take 1,000 mg by mouth every morning.     vitamin E 180 MG (400 UNITS) capsule Take 400 Units by mouth daily.     ZINC CITRATE PO Take 22 mg by mouth daily.     moxifloxacin (VIGAMOX) 0.5 % ophthalmic solution  (Patient not taking: Reported on 12/28/2021)     No current facility-administered medications for this encounter.    Allergies  Allergen Reactions   Erythromycin     Upset stomach   Nsaids Other (See Comments)    Has a history of bleeding ulcers, tolerates aspirin    Penicillins Hives and Shortness Of Breath    Did it involve swelling of the face/tongue/throat, SOB, or low BP? Yes Did it involve sudden or severe rash/hives, skin peeling, or any reaction on the inside  of your mouth or nose? No Did you need to seek medical attention at a hospital or doctor's office? No When did it last happen?      45 years If all above answers  are "NO", may proceed with cephalosporin use.     Social History   Socioeconomic History   Marital status: Divorced    Spouse name: Not on file   Number of children: 2   Years of education: 12th grade   Highest education level: High school graduate  Occupational History   Occupation: Retired,  Tobacco Use   Smoking status: Never   Smokeless tobacco: Never   Tobacco comments:    Never smoke 07/16/21  Vaping Use   Vaping Use: Never used  Substance and Sexual Activity   Alcohol use: Never    Alcohol/week: 0.0 standard drinks of alcohol   Drug use: No   Sexual activity: Yes    Birth control/protection: Post-menopausal    Comment: 1st intercourse 71 yo-Fewer than 5 partners  Other Topics Concern   Not on file  Social History Narrative   Lives along with her dog. She enjoys croteching, crafts, making wreaths, reading and gardening in her free time.    Social Determinants of Health   Financial Resource Strain: Low Risk  (03/26/2021)   Overall Financial Resource Strain (CARDIA)    Difficulty of Paying Living Expenses: Not hard at all  Food Insecurity: No Food Insecurity (03/26/2021)   Hunger Vital Sign    Worried About Running Out of Food in the Last Year: Never true    Ran Out of Food in the Last Year: Never true  Transportation Needs: No Transportation Needs (03/26/2021)   PRAPARE - Hydrologist (Medical): No    Lack of Transportation (Non-Medical): No  Physical Activity: Sufficiently Active (03/26/2021)   Exercise Vital Sign    Days of Exercise per Week: 3 days    Minutes of Exercise per Session: 120 min  Stress: No Stress Concern Present (03/26/2021)   [place]    Feeling of Stress : Not at all  Social Connections: Moderately Isolated (03/26/2021)   Social Connection and Isolation Panel [NHANES]    Frequency of Communication with Friends and Family: More than three times a week     Frequency of Social Gatherings with Friends and Family: More than three times a week    Attends Religious Services: More than 4 times per year    Active Member of [employer] or Organizations: No    Attends Archivist Meetings: Never    Marital Status: Divorced  Human resources officer Violence: Not At Risk (03/26/2021)   Humiliation, Afraid, Rape, and Kick questionnaire    Fear of Current or Ex-Partner: No    Emotionally Abused: No    Physically Abused: No    Sexually Abused: No    Family History  Problem Relation Age of Onset   Cancer Father        prostate   Breast cancer Paternal Aunt 73   Diabetes Maternal Grandmother    Heart disease Maternal Grandmother    Heart disease Maternal Grandfather    Melanoma Paternal Aunt    Neuropathy Mother    Congestive Heart Failure Mother    Thyroid disease Daughter     ROS- All systems are reviewed and negative except as per the HPI above  Physical Exam: Vitals:   12/28/21 0959  BP: 126/80  Pulse: 72  Weight: 81.1 kg  Height: '5\' 4"'$  (1.626 m)    Wt Readings from Last 3 Encounters:  12/28/21 81.1 kg  12/19/21 78.5 kg  12/13/21 80 kg    Labs: Lab Results  Component Value Date   NA 138 12/13/2021   K 4.3 12/13/2021   CL 110 12/13/2021   CO2 19 (L) 12/13/2021   GLUCOSE 93 12/13/2021   BUN 22 12/13/2021   CREATININE 1.22 (H) 12/13/2021   CALCIUM 9.6 12/13/2021   PHOS 3.1 07/05/2021   MG 2.3 07/10/2021   Lab Results  Component Value Date   INR 1.1 12/25/2018   Lab Results  Component Value Date   CHOL 218 (H) 12/13/2021   HDL 56 12/13/2021   LDLCALC 94 12/13/2021   TRIG 341 (H) 12/13/2021     GEN- The patient is a well appearing female, alert and oriented x 3 today.   HEENT-head normocephalic, atraumatic, sclera clear, conjunctiva pink, hearing intact, trachea midline. Lungs- Clear to ausculation bilaterally, normal work of breathing Heart- regular rate and rhythm, no murmurs, rubs or gallops  GI- soft, NT,  ND, + BS Extremities- no clubbing, cyanosis, or edema MS- no significant deformity or atrophy Skin- no rash or lesion Psych- euthymic mood, full affect Neuro- strength and sensation are intact  Echo TEE- 07/05/21- 1. Left ventricular ejection fraction, by estimation, is 55 to 60%. The  left ventricle has normal function. There is mild left ventricular  hypertrophy.   2. Peak RV-RA gradient 22 mmHg. Right ventricular systolic function is  mildly reduced. The right ventricular size is normal.   3. Left atrial size was severely dilated. No left atrial/left atrial  appendage thrombus was detected.   4. Right atrial size was moderately dilated.   5. The mitral valve is abnormal. Moderate to severe central mitral valve  regurgitation. PISA ERO 0.35 cm^2, vena contracta area from 3-D imaging  0.26 cm^2. Suspect functional MR, likely atrial functional MR with dilated  left atrium. No evidence of  mitral stenosis.   6. The aortic valve is tricuspid. Aortic valve regurgitation is trivial.  No aortic stenosis is present.   7. No PFO or ASD by color doppler. Vent. rate 72 BPM   EKG-PR interval 226 ms QRS duration 84 ms QT/QTcB 404/442 ms P-R-T axes 65 18 7 Sinus rhythm with 1st degree A-V block Low voltage QRS Cannot rule out Anterior infarct , age undetermined Abnormal ECG When compared with ECG of 13-Dec-2021 10:49, PREVIOUS ECG IS PRESENT    Assessment and Plan: 1. Persistent atrial fibrillation/atrial flutter S/p ablation 02/11/19 and most recent 11/23/21 Continues in [place] on amiodarone 200 mg daily Continue on 100 mg Toprol   2. CHA2DS2VASc score of at least 3 Continue eliquis 5 mg bid without interruption   3. MVP Mod-severe MR Per Dr. Aundra Dubin   4. Chronic diastolic CHF Appears euvolemic today. Followed in [facility]  5. HTN Stable   Follow up with [facility] and with Dr Curt Bears as scheduled  02/28/22   Geroge Baseman. Loreli Debruler, [facility] [address] [place], [place] [address] [phone]

## 2022-01-08 ENCOUNTER — Ambulatory Visit (HOSPITAL_COMMUNITY): Payer: PPO | Admitting: Nurse Practitioner

## 2022-01-11 DIAGNOSIS — B6013 Keratoconjunctivitis due to Acanthamoeba: Secondary | ICD-10-CM | POA: Diagnosis not present

## 2022-01-11 DIAGNOSIS — L718 Other rosacea: Secondary | ICD-10-CM | POA: Diagnosis not present

## 2022-01-28 ENCOUNTER — Other Ambulatory Visit (HOSPITAL_COMMUNITY): Payer: Self-pay | Admitting: Family Medicine

## 2022-01-30 ENCOUNTER — Other Ambulatory Visit: Payer: Self-pay | Admitting: Family Medicine

## 2022-02-01 DIAGNOSIS — B6013 Keratoconjunctivitis due to Acanthamoeba: Secondary | ICD-10-CM | POA: Diagnosis not present

## 2022-02-14 ENCOUNTER — Encounter (HOSPITAL_COMMUNITY): Payer: Self-pay | Admitting: *Deleted

## 2022-02-15 ENCOUNTER — Other Ambulatory Visit: Payer: Self-pay | Admitting: Family Medicine

## 2022-02-28 ENCOUNTER — Encounter: Payer: Self-pay | Admitting: Cardiology

## 2022-02-28 ENCOUNTER — Ambulatory Visit: Payer: PPO | Attending: Cardiology | Admitting: Cardiology

## 2022-02-28 VITALS — BP 110/74 | HR 74 | Ht 64.0 in | Wt 174.0 lb

## 2022-02-28 DIAGNOSIS — I5032 Chronic diastolic (congestive) heart failure: Secondary | ICD-10-CM | POA: Diagnosis not present

## 2022-02-28 DIAGNOSIS — D6869 Other thrombophilia: Secondary | ICD-10-CM | POA: Diagnosis not present

## 2022-02-28 DIAGNOSIS — I4819 Other persistent atrial fibrillation: Secondary | ICD-10-CM

## 2022-02-28 DIAGNOSIS — I10 Essential (primary) hypertension: Secondary | ICD-10-CM

## 2022-02-28 NOTE — Progress Notes (Signed)
Electrophysiology Office Note   Date:  02/28/2022   ID:  Anna Marquez, DOB Jul 28, 1950, MRN [id number]  PCP:  [name], DO  Cardiologist:  [facility] Primary Electrophysiologist:  Remijio Holleran Meredith Leeds, MD    Chief Complaint: AF   History of Present Illness: Anna Marquez is a 72 y.o. female who is being seen today for the evaluation of AF at the request of [name], DO. Presenting today for electrophysiology evaluation.  She has a history significant atrial fibrillation, hypertension, mitral valve prolapse.  She is post ablation 02/11/2019.  May 2023 she was admitted to the hospital with respiratory failure in the setting of atrial fibrillation or heart failure.  She spontaneously converted to sinus rhythm.  She was again admitted 07/03/2021.  She had a cardioversion 07/09/2021.  She is post repeat atrial fibrillation ablation 11/23/2021.  Today, denies symptoms of palpitations, chest pain, shortness of breath, orthopnea, PND, lower extremity edema, claudication, dizziness, presyncope, syncope, bleeding, or neurologic sequela. The patient is tolerating medications without difficulties.  She has done well.  She has noted no further episodes of atrial fibrillation.  She has had intermittent palpitations last less than a second.   Past Medical History:  Diagnosis Date   Adenomatous colon polyp 2003   Cholelithiasis    COVID    Diverticulosis of colon    Gastric polyps    GERD (gastroesophageal reflux disease)    Helicobacter pylori (H. pylori)    Hiatal hernia    Hypertension    Mitral valve prolapse    Nephrolithiasis    Osteoarthritis    Osteopenia 03/2017   T score -1.5 FRAX 8.9% / 0.9%.  Stable from prior DEXA 2016   Persistent atrial fibrillation (HCC)    TGA (transient global amnesia)    Past Surgical History:  Procedure Laterality Date   ANTERIOR AND POSTERIOR REPAIR N/A 06/14/2014   Procedure: ANTERIOR (CYSTOCELE) AND POSTERIOR REPAIR (RECTOCELE);  Surgeon: Anastasio Auerbach, MD;  Location: [facility];  Service: Gynecology;  Laterality: N/A;   ATRIAL FIBRILLATION ABLATION N/A 02/11/2019   Procedure: ATRIAL FIBRILLATION ABLATION;  Surgeon: Thompson Grayer, MD;  Location: [facility];  Service: Cardiovascular;  Laterality: N/A;   ATRIAL FIBRILLATION ABLATION N/A 11/23/2021   Procedure: ATRIAL FIBRILLATION ABLATION;  Surgeon: Constance Haw, MD;  Location: [facility];  Service: Cardiovascular;  Laterality: N/A;   CARDIOVERSION N/A 01/15/2019   Procedure: CARDIOVERSION;  Surgeon: Fay Records, MD;  Location: [facility] END[facility]COPY;  Service: Cardiovascular;  Laterality: N/A;   CARDIOVERSION N/A 07/09/2021   Procedure: CARDIOVERSION;  Surgeon: Larey Dresser, MD;  Location: [facility]COPY;  Service: Cardiovascular;  Laterality: N/A;   CHOLECYSTECTOMY     kidney stone removal     x2   VAGINAL HYSTERECTOMY N/A 06/14/2014   Procedure: HYSTERECTOMY VAGINAL;  Surgeon: Anastasio Auerbach, MD;  Location: [facility];  Service: Gynecology;  Laterality: N/A;     Current Outpatient Medications  Medication Sig Dispense Refill   acetaminophen (TYLENOL) 500 MG tablet Take 1,000 mg by mouth every 6 (six) hours as needed for moderate pain or headache.     albuterol (VENTOLIN HFA) 108 (90 Base) MCG/ACT inhaler INHALE 1 TO 2 PUFFS INTO THE LUNGS EVERY 6 HOURS AS NEEDED FOR WHEEZING OR SHORTNESS OF BREATH. 8.5 g 3   Ascorbic Acid (VITAMIN C) 1000 MG tablet Take 1,000 mg by mouth daily.     atropine 1 % ophthalmic solution Place 1 drop into the  left eye daily.     Carboxymethylcellulose Sodium (EYE DROPS OP) Place 1 drop in the left eye every other hour- CHLORHEXIDINE, PRESERVED, 0.02 % OPHTHALMIC SOLUTION     Cholecalciferol (VITAMIN D) 50 MCG (2000 UT) tablet Take 2,000 Units by mouth daily.     ELIQUIS 5 MG TABS tablet TAKE 1 TABLET BY MOUTH 2 TIMES DAILY. 60 tablet 5   FARXIGA 10 MG TABS tablet TAKE 1 TABLET (10 MG TOTAL) BY MOUTH DAILY. 30 tablet 5   ferrous sulfate 325 (65  FE) MG tablet Take 325 mg by mouth every other day.     fluticasone (FLONASE) 50 MCG/ACT nasal spray PLACE 1 SPRAY INTO BOTH NOSTRILS AS NEEDED FOR ALLERGIES OR RHINITIS. 16 g 6   furosemide (LASIX) 20 MG tablet Take 1 tablet (20 mg total) by mouth as needed for edema or fluid. 90 tablet 3   loratadine (CLARITIN) 10 MG tablet Take 10 mg by mouth daily.      methimazole (TAPAZOLE) 10 MG tablet Take 10 mg by mouth daily.     metoprolol succinate (TOPROL-XL) 100 MG 24 hr tablet TAKE 1 TABLET (100 MG TOTAL) BY MOUTH DAILY. TAKE WITH OR IMMEDIATELY FOLLOWING A MEAL. 90 tablet 2   moxifloxacin (VIGAMOX) 0.5 % ophthalmic solution      neomycin-polymyxin b-dexamethasone (MAXITROL) 3.5-10000-0.1 OINT Place 1 Application into the left eye at bedtime.     pantoprazole (PROTONIX) 40 MG tablet TAKE 1 TABLET (40 MG TOTAL) BY MOUTH DAILY. 90 tablet 4   Polyethyl Glycol-Propyl Glycol 0.4-0.3 % SOLN Place 2 drops into the left eye every 2 (two) hours.     potassium chloride (KLOR-CON) 10 MEQ tablet Take 10 mEq by mouth daily as needed (when taking furosemide).     Probiotic Product (PROBIOTIC PO) Take 1 capsule by mouth daily.     rosuvastatin (CRESTOR) 10 MG tablet Take 1 tablet (10 mg total) by mouth daily. 90 tablet 3   spironolactone (ALDACTONE) 25 MG tablet Take 0.5 tablets (12.5 mg total) by mouth daily. 15 tablet 11   trimethoprim-polymyxin b (POLYTRIM) ophthalmic solution Place 1 drop into the left eye in the morning, at noon, in the evening, and at bedtime.     valACYclovir (VALTREX) 1000 MG tablet Take 1,000 mg by mouth every morning.     vitamin E 180 MG (400 UNITS) capsule Take 400 Units by mouth daily.     ZINC CITRATE PO Take 22 mg by mouth daily.     No current facility-administered medications for this visit.    Allergies:   Erythromycin, Nsaids, and Penicillins   Social History:  The patient  reports that she has never smoked. She has never used smokeless tobacco. She reports that she does  not drink alcohol and does not use drugs.   Family History:  The patient's family history includes Breast cancer (age of onset: 1) in her paternal aunt; Cancer in her father; Congestive Heart Failure in her mother; Diabetes in her maternal grandmother; Heart disease in her maternal grandfather and maternal grandmother; Melanoma in her paternal aunt; Neuropathy in her mother; Thyroid disease in her daughter.   ROS:  Please see the history of present illness.   Otherwise, review of systems is positive for none.   All other systems are reviewed and negative.   PHYSICAL EXAM: VS:  BP 110/74   Pulse 74   Ht 5' 4"$  (1.626 m)   Wt 174 lb (78.9 kg)   SpO2 97%  BMI 29.87 kg/m  , BMI Body mass index is 29.87 kg/m. GEN: Well nourished, well developed, in no acute distress  HEENT: normal  Neck: no JVD, carotid bruits, or masses Cardiac: RRR; no murmurs, rubs, or gallops,no edema  Respiratory:  clear to auscultation bilaterally, normal work of breathing GI: soft, nontender, nondistended, + BS MS: no deformity or atrophy  Skin: warm and dry Neuro:  Strength and sensation are intact Psych: euthymic mood, full affect  EKG:  EKG is ordered today. Personal review of the ekg ordered shows sinus rhythm, inferior TWI   Recent Labs: 07/10/2021: Magnesium 2.3 09/12/2021: TSH 4.58 12/13/2021: ALT 17; B Natriuretic Peptide 46.9; BUN 22; Creatinine, Ser 1.22; Hemoglobin 12.6; Platelets 155; Potassium 4.3; Sodium 138    Lipid Panel     Component Value Date/Time   CHOL 218 (H) 12/13/2021 1103   TRIG 341 (H) 12/13/2021 1103   HDL 56 12/13/2021 1103   CHOLHDL 3.9 12/13/2021 1103   VLDL 68 (H) 12/13/2021 1103   LDLCALC 94 12/13/2021 1103   LDLCALC 80 08/07/2020 0000   LDLDIRECT 100.0 11/04/2018 1128     Wt Readings from Last 3 Encounters:  02/28/22 174 lb (78.9 kg)  12/28/21 178 lb 12.8 oz (81.1 kg)  12/19/21 173 lb (78.5 kg)      Other studies Reviewed: Additional studies/ records that were  reviewed today include: TTE 07/04/21  Review of the above records today demonstrates:   1. Left ventricular ejection fraction, by estimation, is 50 to 55%. The  left ventricle has low normal function. The left ventricle has no regional  wall motion abnormalities.   2. Right ventricular systolic function is moderately reduced. The right  ventricular size is mildly enlarged. There is normal pulmonary artery  systolic pressure. The estimated right ventricular systolic pressure is  [id number] mmHg.   3. A small pericardial effusion is present. The pericardial effusion is  surrounding the apex and localized near the right ventricle. There is no  evidence of cardiac tamponade.   4. PISA 0.2 cm. The mitral valve is normal in structure. Mild to moderate  mitral valve regurgitation. No evidence of mitral stenosis.   5. Tricuspid valve regurgitation is mild to moderate.   6. The aortic valve is normal in structure. Aortic valve regurgitation is  not visualized. No aortic stenosis is present.   7. The inferior vena cava is dilated in size with >50% respiratory  variability, suggesting right atrial pressure of 8 mmHg.    ASSESSMENT AND PLAN:  1.  Persistent atrial fibrillation/flutter: Status post ablation 02/11/2019.  Currently on amiodarone and Eliquis.  CHA2DS2-VASc of 3.  Post repeat ablation 11/23/2021.  He has remained in sinus rhythm.  Hridhaan Yohn stop amiodarone today.  2.  Secondary to coagula state: Currently on Eliquis for atrial fibrillation  3.  Mitral valve prolapse: Mild to moderate MR.  Plan per primary cardiology.  4.  Hypertension: Currently well-controlled  5.  Chronic diastolic heart failure: Appears euvolemic.  Followed by heart failure clinic.    Current medicines are reviewed at length with the patient today.   The patient does not have concerns regarding her medicines.  The following changes were made today: Stop amiodarone  Labs/ tests ordered today include:  Orders Placed This  Encounter  Procedures   EKG 12-Lead     Disposition:   FU 6 months  Signed, Makynli Stills Meredith Leeds, MD  02/28/2022 10:14 AM     [facility] [address]  [place] [address] [phone] (office) [phone] (fax)

## 2022-02-28 NOTE — Patient Instructions (Signed)
Medication Instructions:  Your physician has recommended you make the following change in your medication:  STOP Amiodarone  *If you need a refill on your cardiac medications before your next appointment, please call your pharmacy*   Lab Work: None ordered   Testing/Procedures: None ordered   Follow-Up: At [facility], you and your health needs are our priority.  As part of our continuing mission to provide you with exceptional heart care, we have created designated Provider Care Teams.  These Care Teams include your primary Cardiologist (physician) and Advanced Practice Providers (APPs -  Physician Assistants and Nurse Practitioners) who all work together to provide you with the care you need, when you need it.  Your next appointment:   6 month(s)  The format for your next appointment:   In Person  Provider:   Allegra Lai, MD    Thank you for choosing [facility]!!   Trinidad Curet, RN [phone]

## 2022-03-01 DIAGNOSIS — B6013 Keratoconjunctivitis due to Acanthamoeba: Secondary | ICD-10-CM | POA: Diagnosis not present

## 2022-03-20 ENCOUNTER — Encounter: Payer: Self-pay | Admitting: Family Medicine

## 2022-03-20 ENCOUNTER — Ambulatory Visit (INDEPENDENT_AMBULATORY_CARE_PROVIDER_SITE_OTHER): Payer: PPO | Admitting: Family Medicine

## 2022-03-20 VITALS — BP 110/71 | HR 78 | Ht 64.0 in | Wt 174.0 lb

## 2022-03-20 DIAGNOSIS — I10 Essential (primary) hypertension: Secondary | ICD-10-CM

## 2022-03-20 DIAGNOSIS — I4819 Other persistent atrial fibrillation: Secondary | ICD-10-CM | POA: Diagnosis not present

## 2022-03-20 DIAGNOSIS — B6013 Keratoconjunctivitis due to Acanthamoeba: Secondary | ICD-10-CM

## 2022-03-20 DIAGNOSIS — H6690 Otitis media, unspecified, unspecified ear: Secondary | ICD-10-CM | POA: Insufficient documentation

## 2022-03-20 DIAGNOSIS — E0590 Thyrotoxicosis, unspecified without thyrotoxic crisis or storm: Secondary | ICD-10-CM | POA: Diagnosis not present

## 2022-03-20 DIAGNOSIS — E042 Nontoxic multinodular goiter: Secondary | ICD-10-CM | POA: Diagnosis not present

## 2022-03-20 DIAGNOSIS — H66001 Acute suppurative otitis media without spontaneous rupture of ear drum, right ear: Secondary | ICD-10-CM

## 2022-03-20 DIAGNOSIS — I5032 Chronic diastolic (congestive) heart failure: Secondary | ICD-10-CM

## 2022-03-20 MED ORDER — DOXYCYCLINE HYCLATE 100 MG PO TABS: 100.0000 mg | ORAL_TABLET | Freq: Two times a day (BID) | ORAL | 0 refills | Status: DC

## 2022-03-20 NOTE — Assessment & Plan Note (Addendum)
Continues treatment of this under care of ophthalmology at [facility].

## 2022-03-20 NOTE — Assessment & Plan Note (Signed)
She has done well since ablation without any new episodes of A-fib.  Off of amiodarone at this point.  She does remain on metoprolol and anticoagulation with Eliquis.

## 2022-03-20 NOTE — Assessment & Plan Note (Signed)
Management per cardiology.  Remains on Aldactone, Farxiga and furosemide as needed.

## 2022-03-20 NOTE — Assessment & Plan Note (Addendum)
Seeing endocrinology.  Has had hyperthyroidism and remains on methimazole.  Stable at this time.

## 2022-03-20 NOTE — Assessment & Plan Note (Signed)
Blood pressure well-controlled at this time.  She will continue current medications for management of her hypertension and cardiac disorders.

## 2022-03-20 NOTE — Assessment & Plan Note (Signed)
Treated with doxycycline as she is allergic to penicillin with anaphylactic type reaction.

## 2022-03-20 NOTE — Progress Notes (Signed)
Anna Marquez - 72 y.o. female MRN [id number]  Date of birth: 11-Jan-1950  Subjective Chief Complaint  Patient presents with   Ear Fullness    HPI Anna Marquez is a 72 year old female here today for follow-up visit.  Reports some ear fullness with mild pain in the right ear.  She does have some issues with allergies and has had some mild congestion.  She has not well since ablation.  Cardiology has pulled her off of amiodarone as well as lisinopril.  She does remain on metoprolol and Aldactone.  Feels pretty well.  She has not had new symptoms of chest pain, increased dyspnea or palpitations.  She does remain anticoagulated with Eliquis.  She is tolerating this well.  Additionally, she is seeing endocrinology for management of her hyperthyroidism.  Remains on methimazole.  Doing well on Crestor current strength.  No issues with tolerance at this time.  ROS:  A comprehensive ROS was completed and negative except as noted per HPI  Allergies  Allergen Reactions   Erythromycin     Upset stomach   Nsaids Other (See Comments)    Has a history of bleeding ulcers, tolerates aspirin    Penicillins Hives and Shortness Of Breath    Did it involve swelling of the face/tongue/throat, SOB, or low BP? Yes Did it involve sudden or severe rash/hives, skin peeling, or any reaction on the inside of your mouth or nose? No Did you need to seek medical attention at a hospital or doctor's office? No When did it last happen?      45 years If all above answers are "NO", may proceed with cephalosporin use.     Past Medical History:  Diagnosis Date   Adenomatous colon polyp 2003   Cholelithiasis    COVID    Diverticulosis of colon    Gastric polyps    GERD (gastroesophageal reflux disease)    Helicobacter pylori (H. pylori)    Hiatal hernia    Hypertension    Mitral valve prolapse    Nephrolithiasis    Osteoarthritis    Osteopenia 03/2017   T score -1.5 FRAX 8.9% / 0.9%.  Stable from prior  DEXA 2016   Persistent atrial fibrillation (HCC)    TGA (transient global amnesia)     Past Surgical History:  Procedure Laterality Date   ANTERIOR AND POSTERIOR REPAIR N/A 06/14/2014   Procedure: ANTERIOR (CYSTOCELE) AND POSTERIOR REPAIR (RECTOCELE);  Surgeon: Anastasio Auerbach, MD;  Location: [facility];  Service: Gynecology;  Laterality: N/A;   ATRIAL FIBRILLATION ABLATION N/A 02/11/2019   Procedure: ATRIAL FIBRILLATION ABLATION;  Surgeon: Thompson Grayer, MD;  Location: [facility];  Service: Cardiovascular;  Laterality: N/A;   ATRIAL FIBRILLATION ABLATION N/A 11/23/2021   Procedure: ATRIAL FIBRILLATION ABLATION;  Surgeon: Constance Haw, MD;  Location: [facility];  Service: Cardiovascular;  Laterality: N/A;   CARDIOVERSION N/A 01/15/2019   Procedure: CARDIOVERSION;  Surgeon: Fay Records, MD;  Location: [facility] ENDOSCOPY;  Service: Cardiovascular;  Laterality: N/A;   CARDIOVERSION N/A 07/09/2021   Procedure: CARDIOVERSION;  Surgeon: Larey Dresser, MD;  Location: [facility] ENDOSCOPY;  Service: Cardiovascular;  Laterality: N/A;   CHOLECYSTECTOMY     kidney stone removal     x2   VAGINAL HYSTERECTOMY N/A 06/14/2014   Procedure: HYSTERECTOMY VAGINAL;  Surgeon: Anastasio Auerbach, MD;  Location: [facility];  Service: Gynecology;  Laterality: N/A;    Social History   Socioeconomic History   Marital status: Divorced  Spouse name: Not on file   Number of children: 2   Years of education: 12th grade   Highest education level: High school graduate  Occupational History   Occupation: Retired,  Tobacco Use   Smoking status: Never   Smokeless tobacco: Never   Tobacco comments:    Never smoke 07/16/21  Vaping Use   Vaping Use: Never used  Substance and Sexual Activity   Alcohol use: Never    Alcohol/week: 0.0 standard drinks of alcohol   Drug use: No   Sexual activity: Yes    Birth control/protection: Post-menopausal    Comment: 1st intercourse 72 yo-Fewer than 5 partners  Other  Topics Concern   Not on file  Social History Narrative   Lives along with her dog. She enjoys croteching, crafts, making wreaths, reading and gardening in her free time.    Social Determinants of Health   Financial Resource Strain: Low Risk  (03/26/2021)   Overall Financial Resource Strain (CARDIA)    Difficulty of Paying Living Expenses: Not hard at all  Food Insecurity: No Food Insecurity (03/26/2021)   Hunger Vital Sign    Worried About Running Out of Food in the Last Year: Never true    Ran Out of Food in the Last Year: Never true  Transportation Needs: No Transportation Needs (03/26/2021)   PRAPARE - Hydrologist (Medical): No    Lack of Transportation (Non-Medical): No  Physical Activity: Sufficiently Active (03/26/2021)   Exercise Vital Sign    Days of Exercise per Week: 3 days    Minutes of Exercise per Session: 120 min  Stress: No Stress Concern Present (03/26/2021)   [place]    Feeling of Stress : Not at all  Social Connections: Moderately Isolated (03/26/2021)   Social Connection and Isolation Panel [NHANES]    Frequency of Communication with Friends and Family: More than three times a week    Frequency of Social Gatherings with Friends and Family: More than three times a week    Attends Religious Services: More than 4 times per year    Active Member of [employer] or Organizations: No    Attends Archivist Meetings: Never    Marital Status: Divorced    Family History  Problem Relation Age of Onset   Cancer Father        prostate   Breast cancer Paternal Aunt 13   Diabetes Maternal Grandmother    Heart disease Maternal Grandmother    Heart disease Maternal Grandfather    Melanoma Paternal Aunt    Neuropathy Mother    Congestive Heart Failure Mother    Thyroid disease Daughter     Health Maintenance  Topic Date Due   Medicare Annual Wellness (AWV)  03/27/2022    Zoster Vaccines- Shingrix (1 of 2) 03/20/2022 (Originally 02/02/1969)   COVID-19 Vaccine (3 - Pfizer risk series) 04/05/2023 (Originally 04/21/2019)   MAMMOGRAM  07/27/2022   COLONOSCOPY (Pts 45-13yr Insurance coverage will need to be confirmed)  03/13/2023   DTaP/Tdap/Td (3 - Tdap) 10/29/2027   Pneumonia Vaccine 72 Years old  Completed   INFLUENZA VACCINE  Completed   DEXA SCAN  Completed   Hepatitis C Screening  Completed   HPV VACCINES  Aged Out     ----------------------------------------------------------------------------------------------------------------------------------------------------------------------------------------------------------------- Physical Exam BP 110/71 (BP Location: Left Arm, Patient Position: Sitting, Cuff Size: Normal)   Pulse 78   Ht '5\' 4"'$  (1.626 m)   Wt  174 lb (78.9 kg)   SpO2 95%   BMI 29.87 kg/m   Physical Exam Constitutional:      Appearance: Normal appearance.  HENT:     Head: Normocephalic and atraumatic.     Ears:     Comments: She has impaction with soft wax in the right ear.  Once wax was removed TM visualized and is erythematous with some bulging. Neurological:     Mental Status: She is alert.     ------------------------------------------------------------------------------------------------------------------------------------------------------------------------------------------------------------------- Assessment and Plan  Essential hypertension Blood pressure well-controlled at this time.  She will continue current medications for management of her hypertension and cardiac disorders.  Persistent atrial fibrillation ([place]) She has done well since ablation without any new episodes of A-fib.  Off of amiodarone at this point.  She does remain on metoprolol and anticoagulation with Eliquis.  Chronic diastolic heart failure ([place]) Management per cardiology.  Remains on Aldactone, Farxiga and furosemide as needed.  Multiple thyroid  nodules Seeing endocrinology.  Has had hyperthyroidism and remains on methimazole.  Stable at this time.  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  Acute otitis media Treated with doxycycline as she is allergic to penicillin with anaphylactic type reaction.   Meds ordered this encounter  Medications   doxycycline (VIBRA-TABS) 100 MG tablet    Sig: Take 1 tablet (100 mg total) by mouth 2 (two) times daily.    Dispense:  20 tablet    Refill:  0    Return in about 6 months (around 09/20/2022) for HTN.    This visit occurred during the SARS-CoV-2 public health emergency.  Safety protocols were in place, including screening questions prior to the visit, additional usage of staff PPE, and extensive cleaning of exam room while observing appropriate contact time as indicated for disinfecting solutions.

## 2022-03-22 DIAGNOSIS — B6013 Keratoconjunctivitis due to Acanthamoeba: Secondary | ICD-10-CM | POA: Diagnosis not present

## 2022-03-25 ENCOUNTER — Ambulatory Visit (HOSPITAL_COMMUNITY)
Admission: RE | Admit: 2022-03-25 | Discharge: 2022-03-25 | Disposition: A | Discharge status: Home / Self Care | Payer: PPO | Source: Ambulatory Visit | Attending: Family Medicine | Admitting: Family Medicine

## 2022-03-25 ENCOUNTER — Encounter (HOSPITAL_COMMUNITY): Payer: Self-pay | Admitting: Cardiology

## 2022-03-25 ENCOUNTER — Ambulatory Visit (HOSPITAL_BASED_OUTPATIENT_CLINIC_OR_DEPARTMENT_OTHER)
Admission: RE | Admit: 2022-03-25 | Discharge: 2022-03-25 | Disposition: A | Discharge status: Home / Self Care | Payer: PPO | Source: Ambulatory Visit | Attending: Cardiology | Admitting: Cardiology

## 2022-03-25 VITALS — BP 130/80 | HR 79 | Wt 175.4 lb

## 2022-03-25 DIAGNOSIS — I080 Rheumatic disorders of both mitral and aortic valves: Secondary | ICD-10-CM | POA: Insufficient documentation

## 2022-03-25 DIAGNOSIS — I2510 Atherosclerotic heart disease of native coronary artery without angina pectoris: Secondary | ICD-10-CM | POA: Insufficient documentation

## 2022-03-25 DIAGNOSIS — I110 Hypertensive heart disease with heart failure: Secondary | ICD-10-CM | POA: Insufficient documentation

## 2022-03-25 DIAGNOSIS — Z7984 Long term (current) use of oral hypoglycemic drugs: Secondary | ICD-10-CM | POA: Diagnosis not present

## 2022-03-25 DIAGNOSIS — I5033 Acute on chronic diastolic (congestive) heart failure: Secondary | ICD-10-CM | POA: Diagnosis not present

## 2022-03-25 DIAGNOSIS — I5032 Chronic diastolic (congestive) heart failure: Secondary | ICD-10-CM | POA: Diagnosis not present

## 2022-03-25 DIAGNOSIS — Z7901 Long term (current) use of anticoagulants: Secondary | ICD-10-CM | POA: Insufficient documentation

## 2022-03-25 DIAGNOSIS — I4819 Other persistent atrial fibrillation: Secondary | ICD-10-CM | POA: Diagnosis not present

## 2022-03-25 DIAGNOSIS — Z79899 Other long term (current) drug therapy: Secondary | ICD-10-CM | POA: Insufficient documentation

## 2022-03-25 DIAGNOSIS — E0520 Thyrotoxicosis with toxic multinodular goiter without thyrotoxic crisis or storm: Secondary | ICD-10-CM | POA: Insufficient documentation

## 2022-03-25 DIAGNOSIS — Z8616 Personal history of COVID-19: Secondary | ICD-10-CM | POA: Diagnosis not present

## 2022-03-25 LAB — BASIC METABOLIC PANEL
Anion gap: 8 (ref 5–15)
BUN: 14 mg/dL (ref 8–23)
CO2: 22 mmol/L (ref 22–32)
Calcium: 9.7 mg/dL (ref 8.9–10.3)
Chloride: 110 mmol/L (ref 98–111)
Creatinine, Ser: 1.33 mg/dL — ABNORMAL HIGH (ref 0.44–1.00)
GFR, Estimated: 42 mL/min — ABNORMAL LOW (ref >60)
Glucose, Bld: 91 mg/dL (ref 70–99)
Potassium: 3.6 mmol/L (ref 3.5–5.1)
Sodium: 140 mmol/L (ref 135–145)

## 2022-03-25 LAB — HEPATIC FUNCTION PANEL
ALT: 22 U/L (ref 0–44)
AST: 21 U/L (ref 15–41)
Albumin: 4 g/dL (ref 3.5–5.0)
Alkaline Phosphatase: 71 U/L (ref 38–126)
Bilirubin, Direct: 0.3 mg/dL — ABNORMAL HIGH (ref 0.0–0.2)
Indirect Bilirubin: 1.2 mg/dL — ABNORMAL HIGH (ref 0.3–0.9)
Total Bilirubin: 1.5 mg/dL — ABNORMAL HIGH (ref 0.3–1.2)
Total Protein: 6.9 g/dL (ref 6.5–8.1)

## 2022-03-25 LAB — ECHOCARDIOGRAM COMPLETE
AR max vel: 1.49 cm2
AV Area VTI: 1.53 cm2
AV Area mean vel: 1.49 cm2
AV Mean grad: 4 mmHg
AV Peak grad: 8.9 mmHg
AV Vena cont: 0.5 cm
Ao pk vel: 1.49 m/s
Area-P 1/2: 3.63 cm2
MV M vel: 4.97 m/s
MV Peak grad: 98.8 mmHg
Radius: 0.6 cm
S' Lateral: 2.1 cm

## 2022-03-25 LAB — LIPID PANEL
Cholesterol: 135 mg/dL (ref 0–200)
HDL: 58 mg/dL (ref >40)
LDL Cholesterol: 44 mg/dL (ref 0–99)
Total CHOL/HDL Ratio: 2.3 RATIO
Triglycerides: 166 mg/dL — ABNORMAL HIGH (ref <150)
VLDL: 33 mg/dL (ref 0–40)

## 2022-03-25 LAB — CBC
HCT: 36.4 % (ref 36.0–46.0)
Hemoglobin: 12.8 g/dL (ref 12.0–15.0)
MCH: 32.9 pg (ref 26.0–34.0)
MCHC: 35.2 g/dL (ref 30.0–36.0)
MCV: 93.6 fL (ref 80.0–100.0)
Platelets: 155 10*3/uL (ref 150–400)
RBC: 3.89 MIL/uL (ref 3.87–5.11)
RDW: 12.7 % (ref 11.5–15.5)
WBC: 5 10*3/uL (ref 4.0–10.5)
nRBC: 0 % (ref 0.0–0.2)

## 2022-03-25 NOTE — Progress Notes (Signed)
ADVANCED HF CLINIC NOTE  Primary Care: Default, Provider, MD HF Cardiologist: Dr. Aundra Dubin  HPI: Anna Marquez is a 72 y.o.with history of diastolic CHF, paroxysmal atrial fibrillation, and hyperthyroidism.  She was was admitted with atrial fibrillation/RVR and acute on chronic diastolic CHF. Patient was on Tikosyn several years ago for AF then had ablation in 2/21 by Dr. Rayann Heman.  Tikosyn was later stopped.  Recently, she has re-developed episodes of atrial fibrillation.  She also, of noted, has been found to have hyperthyroidism likely related to multinodular goiter.  She was admitted in 5/23 with acute respiratory failure in setting of atrial fibrillation and CHF, there was some question of PNA.  She spontaneously converted to NSR.  At office visit subsequently on 06/19/21, she remained in NSR.     She was admitted  07/03/21 with atrial fibrillation with RVR and developed progressive respiratory distress. Amiodarone gtt was started and she was given Lasix, but ultimately required intubation. She developed hypotension and required NE + vasopressin with elevated lactate, and AKI. TEE showed normal LV size with EF 60-65%, mild RV dysfunction, severe LAE with moderate-severe central MR (likely atrial functional MR), no LA appendage thrombus. DCCV was attempted x 2, both times she went back into NSR but promptly reverted to AF. Eventual successful DCCV to NSR on 07/09/21. Drips weaned off, and started on oral amiodarone taper. GDMT started and she was discharged home, weight 162 lbs.  Follow up in AF clinic 07/16/21, HR 123 in Afib. Felt AF paroxysmal and needed more time for amiodarone load. Toprol started.   In 11/23, she had atrial fibrillation ablation.  Amiodarone was stopped in 2/24.   Echo was done today and reviewed, EF 55-60%, normal RV, mild MR.   Today she returns for HF follow up. She is in NSR today and denies palpitations.  Rarely taking Lasix. No significant exertional dyspnea or chest  pain.  No orthopnea/PND.  No lightheadedness.   ECG (personally reviewed): NSR, 1st degree AVB, nonspecific TWF  Labs (7/23): K 4.8, creatinine 1.47, normal LFTs Labs (8/23): K 3.7, creatinine 1.05 Labs (10/23): K 3.7, creatinine 1.11 Labs (12/23): LDL 94, TGs 341, TSH 15, BNP 47, K 4.3, creatinine 1.22  PMH: 1. Cholelithiasis 2. Diverticulosis 3. H/o COVID-19 4. Hyperthyroidism: likely related to amiodarone.  5. Atrial fibrillation: Paroxysmal. - Atrial fibrillation ablation 2/21.  - Atrial fibrillation ablation in 11/23.  6. Chronic diastolic CHF:  TEE (0000000) with normal LV size with EF 60-65%, mild RV dysfunction, severe LAE with moderate-severe central MR (likely atrial functional MR), no LA appendage thrombus. - Echo (3/24): EF 55-60%, normal RV, mild MR.  7. CAD: CTA for pulmonary veins showed calcium score 134, 72nd percentile.  8. Acanthamoeba keratitis   Current Outpatient Medications  Medication Sig Dispense Refill   acetaminophen (TYLENOL) 500 MG tablet Take 1,000 mg by mouth every 6 (six) hours as needed for moderate pain or headache.     albuterol (VENTOLIN HFA) 108 (90 Base) MCG/ACT inhaler INHALE 1 TO 2 PUFFS INTO THE LUNGS EVERY 6 HOURS AS NEEDED FOR WHEEZING OR SHORTNESS OF BREATH. 8.5 g 3   Ascorbic Acid (VITAMIN C) 1000 MG tablet Take 1,000 mg by mouth daily.     atropine 1 % ophthalmic solution Place 1 drop into the left eye daily.     Carboxymethylcellulose Sodium (EYE DROPS OP) Place 1 drop in the left eye every other hour- CHLORHEXIDINE, PRESERVED, 0.02 % OPHTHALMIC SOLUTION     Cholecalciferol (VITAMIN D)  50 MCG (2000 UT) tablet Take 2,000 Units by mouth daily.     doxycycline (VIBRA-TABS) 100 MG tablet Take 1 tablet (100 mg total) by mouth 2 (two) times daily. 20 tablet 0   ELIQUIS 5 MG TABS tablet TAKE 1 TABLET BY MOUTH 2 TIMES DAILY. 60 tablet 5   FARXIGA 10 MG TABS tablet TAKE 1 TABLET (10 MG TOTAL) BY MOUTH DAILY. 30 tablet 5   ferrous sulfate 325 (65  FE) MG tablet Take 325 mg by mouth every other day.     fluticasone (FLONASE) 50 MCG/ACT nasal spray PLACE 1 SPRAY INTO BOTH NOSTRILS AS NEEDED FOR ALLERGIES OR RHINITIS. 16 g 6   furosemide (LASIX) 20 MG tablet Take 1 tablet (20 mg total) by mouth as needed for edema or fluid. 90 tablet 3   loratadine (CLARITIN) 10 MG tablet Take 10 mg by mouth daily.      methimazole (TAPAZOLE) 10 MG tablet Take 10 mg by mouth daily.     metoprolol succinate (TOPROL-XL) 100 MG 24 hr tablet TAKE 1 TABLET (100 MG TOTAL) BY MOUTH DAILY. TAKE WITH OR IMMEDIATELY FOLLOWING A MEAL. 90 tablet 2   moxifloxacin (VIGAMOX) 0.5 % ophthalmic solution      neomycin-polymyxin b-dexamethasone (MAXITROL) 3.5-10000-0.1 OINT Place 1 Application into the left eye at bedtime.     pantoprazole (PROTONIX) 40 MG tablet TAKE 1 TABLET (40 MG TOTAL) BY MOUTH DAILY. 90 tablet 4   potassium chloride (KLOR-CON) 10 MEQ tablet Take 10 mEq by mouth daily as needed (when taking furosemide).     Probiotic Product (PROBIOTIC PO) Take 1 capsule by mouth daily.     rosuvastatin (CRESTOR) 10 MG tablet Take 1 tablet (10 mg total) by mouth daily. 90 tablet 3   spironolactone (ALDACTONE) 25 MG tablet Take 0.5 tablets (12.5 mg total) by mouth daily. 15 tablet 11   trimethoprim-polymyxin b (POLYTRIM) ophthalmic solution Place 1 drop into the left eye in the morning, at noon, in the evening, and at bedtime.     valACYclovir (VALTREX) 1000 MG tablet Take 1,000 mg by mouth every morning.     vitamin E 180 MG (400 UNITS) capsule Take 400 Units by mouth daily.     ZINC CITRATE PO Take 22 mg by mouth daily.     No current facility-administered medications for this encounter.   Allergies  Allergen Reactions   Erythromycin     Upset stomach   Nsaids Other (See Comments)    Has a history of bleeding ulcers, tolerates aspirin    Penicillins Hives and Shortness Of Breath    Did it involve swelling of the face/tongue/throat, SOB, or low BP? Yes Did it  involve sudden or severe rash/hives, skin peeling, or any reaction on the inside of your mouth or nose? No Did you need to seek medical attention at a hospital or doctor's office? No When did it last happen?      45 years If all above answers are "NO", may proceed with cephalosporin use.    Social History   Socioeconomic History   Marital status: Divorced    Spouse name: Not on file   Number of children: 2   Years of education: 12th grade   Highest education level: High school graduate  Occupational History   Occupation: Retired,  Tobacco Use   Smoking status: Never   Smokeless tobacco: Never   Tobacco comments:    Never smoke 07/16/21  Vaping Use   Vaping Use: Never used  Substance and Sexual Activity   Alcohol use: Never    Alcohol/week: 0.0 standard drinks of alcohol   Drug use: No   Sexual activity: Yes    Birth control/protection: Post-menopausal    Comment: 1st intercourse 72 yo-Fewer than 5 partners  Other Topics Concern   Not on file  Social History Narrative   Lives along with her dog. She enjoys croteching, crafts, making wreaths, reading and gardening in her free time.    Social Determinants of Health   Financial Resource Strain: Low Risk  (03/26/2021)   Overall Financial Resource Strain (CARDIA)    Difficulty of Paying Living Expenses: Not hard at all  Food Insecurity: No Food Insecurity (03/26/2021)   Hunger Vital Sign    Worried About Running Out of Food in the Last Year: Never true    Ran Out of Food in the Last Year: Never true  Transportation Needs: No Transportation Needs (03/26/2021)   PRAPARE - Hydrologist (Medical): No    Lack of Transportation (Non-Medical): No  Physical Activity: Sufficiently Active (03/26/2021)   Exercise Vital Sign    Days of Exercise per Week: 3 days    Minutes of Exercise per Session: 120 min  Stress: No Stress Concern Present (03/26/2021)   [place]    Feeling of Stress : Not at all  Social Connections: Moderately Isolated (03/26/2021)   Social Connection and Isolation Panel [NHANES]    Frequency of Communication with Friends and Family: More than three times a week    Frequency of Social Gatherings with Friends and Family: More than three times a week    Attends Religious Services: More than 4 times per year    Active Member of [employer] or Organizations: No    Attends Archivist Meetings: Never    Marital Status: Divorced  Human resources officer Violence: Not At Risk (03/26/2021)   Humiliation, Afraid, Rape, and Kick questionnaire    Fear of Current or Ex-Partner: No    Emotionally Abused: No    Physically Abused: No    Sexually Abused: No   Family History  Problem Relation Age of Onset   Cancer Father        prostate   Breast cancer Paternal Aunt 37   Diabetes Maternal Grandmother    Heart disease Maternal Grandmother    Heart disease Maternal Grandfather    Melanoma Paternal Aunt    Neuropathy Mother    Congestive Heart Failure Mother    Thyroid disease Daughter    BP 130/80   Pulse 79   Wt 79.6 kg (175 lb 6.4 oz)   SpO2 96%   BMI 30.11 kg/m   Wt Readings from Last 3 Encounters:  03/25/22 79.6 kg (175 lb 6.4 oz)  03/20/22 78.9 kg (174 lb)  02/28/22 78.9 kg (174 lb)   PHYSICAL EXAM: General: NAD Neck: No JVD, no thyromegaly or thyroid nodule.  Lungs: Clear to auscultation bilaterally with normal respiratory effort. CV: Nondisplaced PMI.  Heart regular S1/S2, no S3/S4, no murmur.  No peripheral edema.  No carotid bruit.  Normal pedal pulses.  Abdomen: Soft, nontender, no hepatosplenomegaly, no distention.  Skin: Intact without lesions or rashes.  Neurologic: Alert and oriented x 3.  Psych: Normal affect. Extremities: No clubbing or cyanosis.  HEENT: Normal.   ASSESSMENT & PLAN: 1. Chronic diastolic CHF: TEE in 0000000 showed normal LV size with EF 60-65%, mild RV dysfunction, severe LAE  with moderate-severe central MR (likely atrial functional MR). Suspect CHF exacerbation in 6/23 was triggered by atrial fibrillation, also mitral regurgitation may have been worsened by atrial fibrillation.  She had a prior admission with CHF in the setting of atrial fibrillation, consistent with flash pulmonary edema. Echo today showed  EF 55-60%, normal RV, mild MR. She is not volume overloaded on exam, rarely using Lasix, NYHA class II.  - Need to keep her in NSR as AF appears to precipitate CHF.  - Continue spironolactone 12.5 mg daily. BMET today.  - Continue Toprol XL 100 mg daily.  - Continue Farxiga 10 mg daily.  - Continue Lasix 20 mg PRN. - Ischemia as a contributor to flash pulmonary edema is possible though less likely. She has not been cathed.  2. Atrial fibrillation: Persistent.  She was on Tikosyn in the past then had AF ablation in 2/21, Tikosyn stopped after this.  She was not on an antiarrhythmic prior to admission.   She was admitted in 5/23 with AF and CHF exacerbation. She has also been noted to have developed hyperthyroidism prior to this admission, this can help trigger AF. Need to keep in NSR due to worsening of HF in AF.  DCCV 07/05/21 failed, but she was still on low dose norepinephrine. DCCV 07/09/21 successful. Amiodarone is not an ideal medication for her (h/o hyperthyroidism).  Would like to avoid this long-term. She had re-do atrial fibrillation ablation with Dr. Curt Bears 11/23/21. She is in NSR today and amiodarone has been stopped. - Continue Eliquis 5 mg bid. CBC today.  3. Hyperthyroidism: She had this prior to 5/23 admission (prior to amiodarone use).  Has multinodular goiter. She sees endocrinology. Now off amiodarone.  - Continue methimazole, endocrinology following.  4. CAD: borderline calcium score, 72nd percentile.  - Continue Crestor, check lipids/LFTs.    Followup 6 months with APP  [name] 03/25/2022

## 2022-03-25 NOTE — Patient Instructions (Signed)
There has been no changes to your medications   Labs done today, your results will be available in MyChart, we will contact you for abnormal readings.  Your physician recommends that you schedule a follow-up appointment in: 6 months (September) ** please call the office in July to arrange your follow up appointment. **  If you have any questions or concerns before your next appointment please send us a message through mychart or call our office at [phone].    TO LEAVE A MESSAGE FOR THE NURSE SELECT OPTION 2, PLEASE LEAVE A MESSAGE INCLUDING: YOUR NAME DATE OF BIRTH CALL BACK NUMBER REASON FOR CALL**this is important as we prioritize the call backs  YOU WILL RECEIVE A CALL BACK THE SAME DAY AS LONG AS YOU CALL BEFORE 4:00 PM  At the [facility], you and your health needs are our priority. As part of our continuing mission to provide you with exceptional heart care, we have created designated Provider Care Teams. These Care Teams include your primary Cardiologist (physician) and Advanced Practice Providers (APPs- Physician Assistants and Nurse Practitioners) who all work together to provide you with the care you need, when you need it.   You may see any of the following providers on your designated Care Team at your next follow up: Dr Daniel Bensimhon Dr Dalton McLean Dr. Aditya Sabharwal Amy Clegg, NP Brittainy Simmons, PA Jessica Milford,NP Lindsay Finch, PA Alma Diaz, NP Lauren Kemp, PharmD   Please be sure to bring in all your medications bottles to every appointment.    Thank you for choosing [facility]     

## 2022-04-12 DIAGNOSIS — L718 Other rosacea: Secondary | ICD-10-CM | POA: Diagnosis not present

## 2022-05-04 ENCOUNTER — Other Ambulatory Visit: Payer: Self-pay | Admitting: Cardiovascular Disease

## 2022-05-04 DIAGNOSIS — I4819 Other persistent atrial fibrillation: Secondary | ICD-10-CM

## 2022-05-10 DIAGNOSIS — B6013 Keratoconjunctivitis due to Acanthamoeba: Secondary | ICD-10-CM | POA: Diagnosis not present

## 2022-05-10 DIAGNOSIS — L718 Other rosacea: Secondary | ICD-10-CM | POA: Diagnosis not present

## 2022-05-30 DIAGNOSIS — E0590 Thyrotoxicosis, unspecified without thyrotoxic crisis or storm: Secondary | ICD-10-CM | POA: Diagnosis not present

## 2022-06-13 DIAGNOSIS — Z1330 Encounter for screening examination for mental health and behavioral disorders, unspecified: Secondary | ICD-10-CM | POA: Diagnosis not present

## 2022-06-13 DIAGNOSIS — E042 Nontoxic multinodular goiter: Secondary | ICD-10-CM | POA: Diagnosis not present

## 2022-06-13 DIAGNOSIS — E0590 Thyrotoxicosis, unspecified without thyrotoxic crisis or storm: Secondary | ICD-10-CM | POA: Diagnosis not present

## 2022-06-21 DIAGNOSIS — L718 Other rosacea: Secondary | ICD-10-CM | POA: Diagnosis not present

## 2022-06-21 DIAGNOSIS — B6013 Keratoconjunctivitis due to Acanthamoeba: Secondary | ICD-10-CM | POA: Diagnosis not present

## 2022-07-26 ENCOUNTER — Other Ambulatory Visit (HOSPITAL_COMMUNITY): Payer: Self-pay | Admitting: Family Medicine

## 2022-07-30 DIAGNOSIS — E0590 Thyrotoxicosis, unspecified without thyrotoxic crisis or storm: Secondary | ICD-10-CM | POA: Diagnosis not present

## 2022-08-12 ENCOUNTER — Other Ambulatory Visit (HOSPITAL_COMMUNITY): Payer: Self-pay | Admitting: Family Medicine

## 2022-08-12 ENCOUNTER — Other Ambulatory Visit (HOSPITAL_COMMUNITY): Payer: Self-pay

## 2022-08-12 MED ORDER — DAPAGLIFLOZIN PROPANEDIOL 10 MG PO TABS: 10.0000 mg | ORAL_TABLET | Freq: Every day | ORAL | 5 refills | Status: DC

## 2022-08-28 ENCOUNTER — Ambulatory Visit: Payer: PPO | Attending: Cardiology | Admitting: Student

## 2022-08-28 ENCOUNTER — Encounter: Payer: Self-pay | Admitting: Student

## 2022-08-28 VITALS — BP 110/76 | HR 90 | Ht 64.0 in | Wt 171.0 lb

## 2022-08-28 DIAGNOSIS — I5032 Chronic diastolic (congestive) heart failure: Secondary | ICD-10-CM

## 2022-08-28 DIAGNOSIS — I10 Essential (primary) hypertension: Secondary | ICD-10-CM

## 2022-08-28 DIAGNOSIS — I4819 Other persistent atrial fibrillation: Secondary | ICD-10-CM | POA: Diagnosis not present

## 2022-08-28 NOTE — Patient Instructions (Signed)
Medication Instructions:  Your physician recommends that you continue on your current medications as directed. Please refer to the Current Medication list given to you today.  *If you need a refill on your cardiac medications before your next appointment, please call your pharmacy*  Lab Work: BMET, CBC--TODAY If you have labs (blood work) drawn today and your tests are completely normal, you will receive your results only by: MyChart Message (if you have MyChart) OR A paper copy in the mail If you have any lab test that is abnormal or we need to change your treatment, we will call you to review the results.  Follow-Up: At [facility], you and your health needs are our priority.  As part of our continuing mission to provide you with exceptional heart care, we have created designated Provider Care Teams.  These Care Teams include your primary Cardiologist (physician) and Advanced Practice Providers (APPs -  Physician Assistants and Nurse Practitioners) who all work together to provide you with the care you need, when you need it.  Your next appointment:   6 month(s)  Provider:   Will Camnitz, MD  

## 2022-08-28 NOTE — Progress Notes (Signed)
  Electrophysiology Office Note:   Date:  08/28/2022  ID:  KRISTIANA BOWERMAN, DOB 1950-08-15, MRN [id number]  Primary Cardiologist: Kristeen Miss, MD Electrophysiologist: Regan Lemming, MD      History of Present Illness:   Anna Marquez is a 72 y.o. female with h/o persistent AF s/p ablation x 2, chronic diastolic CHF, hyperthyroidism, and HTN seen today for routine electrophysiology followup.   Since last being seen in our clinic the patient reports doing well overall. She has rare, brief palpitations, but no longer than a couple of beats. She occasional has a "shooting pain" across her chest. No exertional component. Usually when she is sitting in the evening watching TV or crocheting. she denies dyspnea, PND, orthopnea, nausea, vomiting, dizziness, syncope, edema, weight gain, or early satiety.   Review of systems complete and found to be negative unless listed in HPI.   EP Information / Studies Reviewed:    EKG was ordered but did not cross over.   Kardia mobile app showed NSR at 88 bpm.     Arrhythmia history Ablation 02/2019 Tikosyn > Stopped after ablation Repeat ablation 11/2021 Amiodarone > stopped after second ablation.   Physical Exam:   VS:  BP 110/76   Pulse 90   Ht 5\' 4"  (1.626 m)   Wt 171 lb (77.6 kg)   BMI 29.35 kg/m    Wt Readings from Last 3 Encounters:  08/28/22 171 lb (77.6 kg)  03/25/22 175 lb 6.4 oz (79.6 kg)  03/20/22 174 lb (78.9 kg)     GEN: Well nourished, well developed in no acute distress NECK: No JVD; No carotid bruits CARDIAC: Regular rate and rhythm, no murmurs, rubs, gallops RESPIRATORY:  Clear to auscultation without rales, wheezing or rhonchi  ABDOMEN: Soft, non-tender, non-distended EXTREMITIES:  No edema; No deformity   ASSESSMENT AND PLAN:    Persistent atrial fibrillation NSR by symptoms and on Kardia mobile (EKG did not cross over) Continue Eliquis for CHA2DS2VASc of at least 3.  Continue Toprol 100 mg BID  HTN Stable on  current regimen   Chronic diastolic CHF Echo 03/25/2022 LEF 55-60% Volume status stable on exam.   Follow up with Dr. Elberta Fortis in 6 months  Signed, Graciella Freer, PA-C

## 2022-08-29 ENCOUNTER — Other Ambulatory Visit: Payer: Self-pay | Admitting: Physician Assistant

## 2022-08-29 LAB — CBC
Hematocrit: 38.9 % (ref 34.0–46.6)
Hemoglobin: 13 g/dL (ref 11.1–15.9)
MCH: 31.2 pg (ref 26.6–33.0)
MCHC: 33.4 g/dL (ref 31.5–35.7)
MCV: 93 fL (ref 79–97)
Platelets: 182 10*3/uL (ref 150–450)
RBC: 4.17 x10E6/uL (ref 3.77–5.28)
RDW: 13.6 % (ref 11.7–15.4)
WBC: 5.8 10*3/uL (ref 3.4–10.8)

## 2022-08-29 LAB — BASIC METABOLIC PANEL
BUN/Creatinine Ratio: 12 (ref 12–28)
BUN: 16 mg/dL (ref 8–27)
CO2: 20 mmol/L (ref 20–29)
Calcium: 9.9 mg/dL (ref 8.7–10.3)
Chloride: 107 mmol/L — ABNORMAL HIGH (ref 96–106)
Creatinine, Ser: 1.31 mg/dL — ABNORMAL HIGH (ref 0.57–1.00)
Glucose: 95 mg/dL (ref 70–99)
Potassium: 3.6 mmol/L (ref 3.5–5.2)
Sodium: 145 mmol/L — ABNORMAL HIGH (ref 134–144)
eGFR: 43 mL/min/{1.73_m2} — ABNORMAL LOW (ref >59)

## 2022-08-30 DIAGNOSIS — L718 Other rosacea: Secondary | ICD-10-CM | POA: Diagnosis not present

## 2022-08-30 DIAGNOSIS — B6013 Keratoconjunctivitis due to Acanthamoeba: Secondary | ICD-10-CM | POA: Diagnosis not present

## 2022-09-06 ENCOUNTER — Ambulatory Visit: Payer: PPO | Admitting: Cardiology

## 2022-09-12 ENCOUNTER — Encounter (HOSPITAL_COMMUNITY): Payer: Self-pay | Admitting: Cardiology

## 2022-09-12 ENCOUNTER — Other Ambulatory Visit: Payer: Self-pay

## 2022-09-12 ENCOUNTER — Ambulatory Visit (HOSPITAL_COMMUNITY)
Admission: RE | Admit: 2022-09-12 | Discharge: 2022-09-12 | Disposition: A | Discharge status: Home / Self Care | Payer: PPO | Source: Ambulatory Visit | Attending: Cardiology | Admitting: Cardiology

## 2022-09-12 VITALS — BP 102/60 | HR 80 | Wt 172.8 lb

## 2022-09-12 DIAGNOSIS — I5032 Chronic diastolic (congestive) heart failure: Secondary | ICD-10-CM

## 2022-09-12 DIAGNOSIS — I2510 Atherosclerotic heart disease of native coronary artery without angina pectoris: Secondary | ICD-10-CM | POA: Diagnosis not present

## 2022-09-12 DIAGNOSIS — E0520 Thyrotoxicosis with toxic multinodular goiter without thyrotoxic crisis or storm: Secondary | ICD-10-CM | POA: Insufficient documentation

## 2022-09-12 DIAGNOSIS — I4819 Other persistent atrial fibrillation: Secondary | ICD-10-CM | POA: Insufficient documentation

## 2022-09-12 DIAGNOSIS — R0789 Other chest pain: Secondary | ICD-10-CM | POA: Diagnosis not present

## 2022-09-12 DIAGNOSIS — Z79899 Other long term (current) drug therapy: Secondary | ICD-10-CM | POA: Insufficient documentation

## 2022-09-12 DIAGNOSIS — I5033 Acute on chronic diastolic (congestive) heart failure: Secondary | ICD-10-CM | POA: Diagnosis not present

## 2022-09-12 DIAGNOSIS — Z8616 Personal history of COVID-19: Secondary | ICD-10-CM | POA: Insufficient documentation

## 2022-09-12 DIAGNOSIS — Z7901 Long term (current) use of anticoagulants: Secondary | ICD-10-CM | POA: Insufficient documentation

## 2022-09-12 NOTE — Patient Instructions (Signed)
Great to see you today!!!  No changes were made, please continue current medications  Your physician recommends that you schedule a follow-up appointment in: 6 months (March 2025), PLEASE CALL OUR OFFICE IN JANUARY TO SCHEDULE THIS APPOINTMENT.  If you have any questions or concerns before your next appointment please send [place] a message through [place] or call our office at [phone].    TO LEAVE A MESSAGE FOR THE NURSE SELECT OPTION 2, PLEASE LEAVE A MESSAGE INCLUDING: YOUR NAME DATE OF BIRTH CALL BACK NUMBER REASON FOR CALL**this is important as we prioritize the call backs  YOU WILL RECEIVE A CALL BACK THE SAME DAY AS LONG AS YOU CALL BEFORE 4:00 PM  At the [facility], you and your health needs are our priority. As part of our continuing mission to provide you with exceptional heart care, we have created designated Provider Care Teams. These Care Teams include your primary Cardiologist (physician) and Advanced Practice Providers (APPs- Physician Assistants and Nurse Practitioners) who all work together to provide you with the care you need, when you need it.   You may see any of the following providers on your designated Care Team at your next follow up: Dr Arvilla Meres Dr Marca Ancona Dr. Marcos Eke, NP Robbie Lis, [place] [facility] [place], [place] Brynda Peon, NP Karle Plumber, PharmD   Please be sure to bring in all your medications bottles to every appointment.    Thank you for choosing [facility]

## 2022-09-15 NOTE — Progress Notes (Signed)
ADVANCED HF CLINIC NOTE  Primary Care: Everrett Coombe, DO HF Cardiologist: Dr. Shirlee Latch  HPI: Anna Marquez is a 72 y.o.with history of diastolic CHF, paroxysmal atrial fibrillation, and hyperthyroidism.  She was was admitted with atrial fibrillation/RVR and acute on chronic diastolic CHF. Patient was on Tikosyn several years ago for AF then had ablation in 2/21 by Dr. Johney Frame.  Tikosyn was later stopped.  Recently, she has re-developed episodes of atrial fibrillation.  She also, of noted, has been found to have hyperthyroidism likely related to multinodular goiter.  She was admitted in 5/23 with acute respiratory failure in setting of atrial fibrillation and CHF, there was some question of PNA.  She spontaneously converted to NSR.  At office visit subsequently on 06/19/21, she remained in NSR.     She was admitted  07/03/21 with atrial fibrillation with RVR and developed progressive respiratory distress. Amiodarone gtt was started and she was given Lasix, but ultimately required intubation. She developed hypotension and required NE + vasopressin with elevated lactate, and AKI. TEE showed normal LV size with EF 60-65%, mild RV dysfunction, severe LAE with moderate-severe central MR (likely atrial functional MR), no LA appendage thrombus. DCCV was attempted x 2, both times she went back into NSR but promptly reverted to AF. Eventual successful DCCV to NSR on 07/09/21. Drips weaned off, and started on oral amiodarone taper. GDMT started and she was discharged home, weight 162 lbs.  Follow up in AF clinic 07/16/21, HR 123 in Afib. Felt AF paroxysmal and needed more time for amiodarone load. Toprol started.   In 11/23, she had atrial fibrillation ablation.  Amiodarone was stopped in 2/24.   Echo in 3/24 showed EF 55-60%, normal RV, mild MR.   Today she returns for HF follow up. Weight is down 3 lbs.  She has had to take Lasix about 3 times in the last 6 wks (when her ankles swoll). Endocrinology has cut  back on her methimazole dose. She has rare sharp, atypical chest pain. She is short of breath if she walks up a flight of stairs (mildly) but has no problems walking on flat ground.  She walks and does yoga and Tai Chi for exercise.    ECG (personally reviewed): NSR, nonspecific TWF  Labs (7/23): K 4.8, creatinine 1.47, normal LFTs Labs (8/23): K 3.7, creatinine 1.05 Labs (10/23): K 3.7, creatinine 1.11 Labs (12/23): LDL 94, TGs 341, TSH 15, BNP 47, K 4.3, creatinine 1.22 Labs (3/24): LDL 44 Labs (8/24): hgb 13, K 3.6, creatinine 1.3  PMH: 1. Cholelithiasis 2. Diverticulosis 3. H/o COVID-19 4. Hyperthyroidism: likely related to amiodarone.  5. Atrial fibrillation: Paroxysmal. - Atrial fibrillation ablation 2/21.  - Atrial fibrillation ablation in 11/23.  6. Chronic diastolic CHF:  TEE (6/23) with normal LV size with EF 60-65%, mild RV dysfunction, severe LAE with moderate-severe central MR (likely atrial functional MR), no LA appendage thrombus. - Echo (3/24): EF 55-60%, normal RV, mild MR.  7. CAD: CTA for pulmonary veins showed calcium score 134, 72nd percentile.  8. Acanthamoeba keratitis   Current Outpatient Medications  Medication Sig Dispense Refill   acetaminophen (TYLENOL) 500 MG tablet Take 1,000 mg by mouth every 6 (six) hours as needed for moderate pain or headache.     albuterol (VENTOLIN HFA) 108 (90 Base) MCG/ACT inhaler INHALE 1 TO 2 PUFFS INTO THE LUNGS EVERY 6 HOURS AS NEEDED FOR WHEEZING OR SHORTNESS OF BREATH. 8.5 g 3   Ascorbic Acid (VITAMIN C) 1000 MG tablet  Take 1,000 mg by mouth daily.     Cholecalciferol (VITAMIN D) 50 MCG (2000 UT) tablet Take 2,000 Units by mouth daily.     dapagliflozin propanediol (FARXIGA) 10 MG TABS tablet Take 1 tablet (10 mg total) by mouth daily. 30 tablet 5   ELIQUIS 5 MG TABS tablet TAKE 1 TABLET BY MOUTH 2 TIMES DAILY. 60 tablet 5   ferrous sulfate 325 (65 FE) MG tablet Take 325 mg by mouth every other day.     fluticasone  (FLONASE) 50 MCG/ACT nasal spray PLACE 1 SPRAY INTO BOTH NOSTRILS AS NEEDED FOR ALLERGIES OR RHINITIS. 16 g 6   furosemide (LASIX) 20 MG tablet Take 1 tablet (20 mg total) by mouth as needed for edema or fluid. 90 tablet 3   loratadine (CLARITIN) 10 MG tablet Take 10 mg by mouth daily.      methimazole (TAPAZOLE) 5 MG tablet Take 5 mg by mouth daily.     metoprolol succinate (TOPROL-XL) 100 MG 24 hr tablet TAKE 1 TABLET (100 MG TOTAL) BY MOUTH DAILY. TAKE WITH OR IMMEDIATELY FOLLOWING A MEAL. 90 tablet 3   pantoprazole (PROTONIX) 40 MG tablet TAKE 1 TABLET (40 MG TOTAL) BY MOUTH DAILY. 90 tablet 4   potassium chloride (KLOR-CON) 10 MEQ tablet TAKE 1 TABLET BY MOUTH DAILY AS NEEDED (TAKE WITH HCTZ). 90 tablet 3   Probiotic Product (PROBIOTIC PO) Take 1 capsule by mouth daily.     rosuvastatin (CRESTOR) 10 MG tablet Take 1 tablet (10 mg total) by mouth daily. 90 tablet 3   spironolactone (ALDACTONE) 25 MG tablet TAKE 1/2 TABLET (12.5 MG TOTAL) BY MOUTH DAILY. 15 tablet 11   valACYclovir (VALTREX) 1000 MG tablet Take 1,000 mg by mouth every morning.     vitamin E 180 MG (400 UNITS) capsule Take 400 Units by mouth daily.     ZINC CITRATE PO Take 22 mg by mouth daily.     atropine 1 % ophthalmic solution Place 1 drop into the left eye daily. (Patient not taking: Reported on 09/12/2022)     Carboxymethylcellulose Sodium (EYE DROPS OP) Place 1 drop in the left eye every other hour- CHLORHEXIDINE, PRESERVED, 0.02 % OPHTHALMIC SOLUTION (Patient not taking: Reported on 08/28/2022)     moxifloxacin (VIGAMOX) 0.5 % ophthalmic solution  (Patient not taking: Reported on 09/12/2022)     neomycin-polymyxin b-dexamethasone (MAXITROL) 3.5-10000-0.1 OINT Place 1 Application into the left eye at bedtime. (Patient not taking: Reported on 09/12/2022)     trimethoprim-polymyxin b (POLYTRIM) ophthalmic solution Place 1 drop into the left eye in the morning, at noon, in the evening, and at bedtime. (Patient not taking: Reported on  09/12/2022)     No current facility-administered medications for this encounter.   Allergies  Allergen Reactions   Erythromycin     Upset stomach   Nsaids Other (See Comments)    Has a history of bleeding ulcers, tolerates aspirin    Penicillins Hives and Shortness Of Breath    Did it involve swelling of the face/tongue/throat, SOB, or low BP? Yes Did it involve sudden or severe rash/hives, skin peeling, or any reaction on the inside of your mouth or nose? No Did you need to seek medical attention at a hospital or doctor's office? No When did it last happen?      45 years If all above answers are "NO", may proceed with cephalosporin use.    Social History   Socioeconomic History   Marital status: Divorced  Spouse name: Not on file   Number of children: 2   Years of education: 12th grade   Highest education level: High school graduate  Occupational History   Occupation: Retired,  Tobacco Use   Smoking status: Never   Smokeless tobacco: Never   Tobacco comments:    Never smoke 07/16/21  Vaping Use   Vaping status: Never Used  Substance and Sexual Activity   Alcohol use: Never    Alcohol/week: 0.0 standard drinks of alcohol   Drug use: No   Sexual activity: Yes    Birth control/protection: Post-menopausal    Comment: 1st intercourse 72 yo-Fewer than 5 partners  Other Topics Concern   Not on file  Social History Narrative   Lives along with her dog. She enjoys croteching, crafts, making wreaths, reading and gardening in her free time.    Social Determinants of Health   Financial Resource Strain: Low Risk  (12/11/2021)   Received from [facility], [facility]   Overall Financial Resource Strain (CARDIA)    Difficulty of Paying Living Expenses: Not very hard  Food Insecurity: No Food Insecurity (12/11/2021)   Received from [facility], [facility]   Hunger Vital Sign    Worried About Running Out of Food in the Last Year: Never true    Ran Out of Food in the  Last Year: Never true  Transportation Needs: No Transportation Needs (03/26/2021)   PRAPARE - Administrator, Civil Service (Medical): No    Lack of Transportation (Non-Medical): No  Physical Activity: Sufficiently Active (12/11/2021)   Received from [facility], [facility]   Exercise Vital Sign    Days of Exercise per Week: 3 days    Minutes of Exercise per Session: 60 min  Stress: No Stress Concern Present (12/11/2021)   Received from [facility], [facility] of Occupational Health - Occupational Stress Questionnaire    Feeling of Stress : Only a little  Social Connections: Socially Integrated (12/11/2021)   Received from [facility], [facility]   Social Network    How would you rate your social network (family, work, friends)?: Good participation with social networks  Intimate Partner Violence: Not At Risk (12/11/2021)   Received from [facility], [facility]   HITS    Over the last 12 months how often did your partner physically hurt you?: 1    Over the last 12 months how often did your partner insult you or talk down to you?: 1    Over the last 12 months how often did your partner threaten you with physical harm?: 1    Over the last 12 months how often did your partner scream or curse at you?: 1   Family History  Problem Relation Age of Onset   Cancer Father        prostate   Breast cancer Paternal Aunt 52   Diabetes Maternal Grandmother    Heart disease Maternal Grandmother    Heart disease Maternal Grandfather    Melanoma Paternal Aunt    Neuropathy Mother    Congestive Heart Failure Mother    Thyroid disease Daughter    BP 102/60   Pulse 80   Wt 78.4 kg (172 lb 12.8 oz)   SpO2 98%   BMI 29.66 kg/m   Wt Readings from Last 3 Encounters:  09/12/22 78.4 kg (172 lb 12.8 oz)  08/28/22 77.6 kg (171 lb)  03/25/22 79.6 kg (175 lb 6.4 oz)  PHYSICAL EXAM: General: NAD Neck: No JVD, no thyromegaly or thyroid nodule.   Lungs: Clear to auscultation bilaterally with normal respiratory effort. CV: Nondisplaced PMI.  Heart regular S1/S2, no S3/S4, no murmur.  No peripheral edema.  No carotid bruit.  Normal pedal pulses.  Abdomen: Soft, nontender, no hepatosplenomegaly, no distention.  Skin: Intact without lesions or rashes.  Neurologic: Alert and oriented x 3.  Psych: Normal affect. Extremities: No clubbing or cyanosis.  HEENT: Normal.   ASSESSMENT & PLAN: 1. Chronic diastolic CHF: TEE in 6/23 showed normal LV size with EF 60-65%, mild RV dysfunction, severe LAE with moderate-severe central MR (likely atrial functional MR). Suspect CHF exacerbation in 6/23 was triggered by atrial fibrillation, also mitral regurgitation may have been worsened by atrial fibrillation.  She had a prior admission with CHF in the setting of atrial fibrillation, consistent with flash pulmonary edema. Echo in 3/24 showed  EF 55-60%, normal RV, mild MR. She is not volume overloaded on exam, rarely takes Lasix.  NYHA class II.   - Need to keep her in NSR as AF appears to precipitate CHF.  - Continue spironolactone 12.5 mg daily. Recent BMET stable.  - Continue Toprol XL 100 mg daily.  - Continue Farxiga 10 mg daily.  - Continue Lasix 20 mg PRN. - Ischemia as a contributor to flash pulmonary edema is possible though less likely. She has not been cathed.  2. Atrial fibrillation: Persistent.  She was on Tikosyn in the past then had AF ablation in 2/21, Tikosyn stopped after this.  She was not on an antiarrhythmic prior to admission.   She was admitted in 5/23 with AF and CHF exacerbation. She has also been noted to have developed hyperthyroidism prior to this admission, this can help trigger AF. Need to keep in NSR due to worsening of HF in AF.  DCCV 07/05/21 failed, but she was still on low dose norepinephrine. DCCV 07/09/21 successful. Amiodarone is not an ideal medication for her (h/o hyperthyroidism). Would like to avoid this long-term. She had  re-do atrial fibrillation ablation with Dr. Elberta Fortis 11/23/21. She is in NSR today and amiodarone has been stopped. - Continue Eliquis 5 mg bid.  3. Hyperthyroidism: She had this prior to 5/23 admission (prior to amiodarone use).  Has multinodular goiter. She sees endocrinology. Now off amiodarone.  - Continue methimazole, endocrinology following and tapering down.  - If she needs thyroid biopsy, ok to hold Eliquis 2 days prior and day of.  4. CAD: borderline calcium score, 72nd percentile.  - Continue Crestor, good lipids in 3/24.   Followup 6 months with APP  Marca Ancona 09/15/2022

## 2022-09-20 ENCOUNTER — Ambulatory Visit: Payer: PPO | Admitting: Family Medicine

## 2022-09-23 ENCOUNTER — Ambulatory Visit (INDEPENDENT_AMBULATORY_CARE_PROVIDER_SITE_OTHER): Payer: PPO | Admitting: Family Medicine

## 2022-09-23 VITALS — BP 106/68 | HR 83 | Ht 64.0 in | Wt 174.0 lb

## 2022-09-23 DIAGNOSIS — I10 Essential (primary) hypertension: Secondary | ICD-10-CM | POA: Diagnosis not present

## 2022-09-23 DIAGNOSIS — I5032 Chronic diastolic (congestive) heart failure: Secondary | ICD-10-CM

## 2022-09-23 DIAGNOSIS — B6013 Keratoconjunctivitis due to Acanthamoeba: Secondary | ICD-10-CM

## 2022-09-23 DIAGNOSIS — I4891 Unspecified atrial fibrillation: Secondary | ICD-10-CM

## 2022-09-23 DIAGNOSIS — Z23 Encounter for immunization: Secondary | ICD-10-CM

## 2022-09-23 DIAGNOSIS — D6869 Other thrombophilia: Secondary | ICD-10-CM | POA: Diagnosis not present

## 2022-09-23 DIAGNOSIS — E0590 Thyrotoxicosis, unspecified without thyrotoxic crisis or storm: Secondary | ICD-10-CM

## 2022-09-23 DIAGNOSIS — Z1231 Encounter for screening mammogram for malignant neoplasm of breast: Secondary | ICD-10-CM

## 2022-09-23 DIAGNOSIS — E042 Nontoxic multinodular goiter: Secondary | ICD-10-CM

## 2022-09-23 MED ORDER — TRAMADOL HCL 50 MG PO TABS
50.0000 mg | ORAL_TABLET | Freq: Three times a day (TID) | ORAL | 0 refills | Status: AC | PRN
Start: 2022-09-23 — End: 2022-09-28

## 2022-09-23 NOTE — Progress Notes (Unsigned)
Anna Marquez - 72 y.o. female MRN [id number]  Date of birth: Jun 02, 1950  Subjective Chief Complaint  Patient presents with   Hypertension    HPI Anna Marquez is a 72 y.o. female here today for follow up visit.    She reports he is doing pretty well at this time.  Continues to see cardiology as well as electrophysiology regularly.  She has remained in normal sinus rhythm.  She does remain on metoprolol.  He is also on Farxiga, furosemide and Aldactone for history of associated CHF.  Blood pressure remains well-controlled.  She denies chest pain, dyspnea or increased edema.  Remains anticoagulated with Eliquis.  She continues to deal with a cantilevered eye infection.  Managed by ophthalmology.  She is planning on seeing a corneal specialist at [facility].  She has seen endocrinology and they are managing her thyroid medications.  Planning on doing a thyroid biopsy but wants to be sure she can come off her eliquis to do this.  ROS:  A comprehensive ROS was completed and negative except as noted per HPI   Allergies  Allergen Reactions   Erythromycin     Upset stomach   Nsaids Other (See Comments)    Has a history of bleeding ulcers, tolerates aspirin    Penicillins Hives and Shortness Of Breath    Did it involve swelling of the face/tongue/throat, SOB, or low BP? Yes Did it involve sudden or severe rash/hives, skin peeling, or any reaction on the inside of your mouth or nose? No Did you need to seek medical attention at a hospital or doctor's office? No When did it last happen?      45 years If all above answers are "NO", may proceed with cephalosporin use.     Past Medical History:  Diagnosis Date   Adenomatous colon polyp 2003   Cholelithiasis    COVID    Diverticulosis of colon    Gastric polyps    GERD (gastroesophageal reflux disease)    Helicobacter pylori (H. pylori)    Hiatal hernia    Hypertension    Mitral valve prolapse    Nephrolithiasis    Osteoarthritis     Osteopenia 03/2017   T score -1.5 FRAX 8.9% / 0.9%.  Stable from prior DEXA 2016   Persistent atrial fibrillation (HCC)    TGA (transient global amnesia)     Past Surgical History:  Procedure Laterality Date   ANTERIOR AND POSTERIOR REPAIR N/A 06/14/2014   Procedure: ANTERIOR (CYSTOCELE) AND POSTERIOR REPAIR (RECTOCELE);  Surgeon: Dara Lords, MD;  Location: [facility];  Service: Gynecology;  Laterality: N/A;   ATRIAL FIBRILLATION ABLATION N/A 02/11/2019   Procedure: ATRIAL FIBRILLATION ABLATION;  Surgeon: Hillis Range, MD;  Location: [facility] INVASIVE CV LAB;  Service: Cardiovascular;  Laterality: N/A;   ATRIAL FIBRILLATION ABLATION N/A 11/23/2021   Procedure: ATRIAL FIBRILLATION ABLATION;  Surgeon: Regan Lemming, MD;  Location: [facility] INVASIVE CV LAB;  Service: Cardiovascular;  Laterality: N/A;   CARDIOVERSION N/A 01/15/2019   Procedure: CARDIOVERSION;  Surgeon: Pricilla Riffle, MD;  Location: [facility] ENDOSCOPY;  Service: Cardiovascular;  Laterality: N/A;   CARDIOVERSION N/A 07/09/2021   Procedure: CARDIOVERSION;  Surgeon: Laurey Morale, MD;  Location: [facility] ENDOSCOPY;  Service: Cardiovascular;  Laterality: N/A;   CHOLECYSTECTOMY     kidney stone removal     x2   VAGINAL HYSTERECTOMY N/A 06/14/2014   Procedure: HYSTERECTOMY VAGINAL;  Surgeon: Dara Lords, MD;  Location: [facility];  Service: Gynecology;  Laterality: N/A;    Social History   Socioeconomic History   Marital status: Divorced    Spouse name: Not on file   Number of children: 2   Years of education: 12th grade   Highest education level: 12th grade  Occupational History   Occupation: Retired,  Tobacco Use   Smoking status: Never   Smokeless tobacco: Never   Tobacco comments:    Never smoke 07/16/21  Vaping Use   Vaping status: Never Used  Substance and Sexual Activity   Alcohol use: Never    Alcohol/week: 0.0 standard drinks of alcohol   Drug use: No   Sexual activity: Yes    Birth control/protection: Post-menopausal     Comment: 1st intercourse 72 yo-Fewer than 5 partners  Other Topics Concern   Not on file  Social History Narrative   Lives along with her dog. She enjoys croteching, crafts, making wreaths, reading and gardening in her free time.    Social Determinants of Health   Financial Resource Strain: Medium Risk (09/23/2022)   Overall Financial Resource Strain (CARDIA)    Difficulty of Paying Living Expenses: Somewhat hard  Food Insecurity: No Food Insecurity (09/23/2022)   Hunger Vital Sign    Worried About Running Out of Food in the Last Year: Never true    Ran Out of Food in the Last Year: Never true  Transportation Needs: No Transportation Needs (09/23/2022)   PRAPARE - Administrator, Civil Service (Medical): No    Lack of Transportation (Non-Medical): No  Physical Activity: Insufficiently Active (09/23/2022)   Exercise Vital Sign    Days of Exercise per Week: 4 days    Minutes of Exercise per Session: 30 min  Stress: No Stress Concern Present (09/23/2022)   [employer] of Occupational Health - Occupational Stress Questionnaire    Feeling of Stress : Only a little  Social Connections: Moderately Integrated (09/23/2022)   Social Connection and Isolation Panel [NHANES]    Frequency of Communication with Friends and Family: More than three times a week    Frequency of Social Gatherings with Friends and Family: More than three times a week    Attends Religious Services: More than 4 times per year    Active Member of Clubs or Organizations: Yes    Attends Engineer, structural: More than 4 times per year    Marital Status: Divorced    Family History  Problem Relation Age of Onset   Cancer Father        prostate   Breast cancer Paternal Aunt 45   Diabetes Maternal Grandmother    Heart disease Maternal Grandmother    Heart disease Maternal Grandfather    Melanoma Paternal Aunt    Neuropathy Mother    Congestive Heart Failure Mother    Thyroid disease Daughter      Health Maintenance  Topic Date Due   MAMMOGRAM  07/27/2022   INFLUENZA VACCINE  08/08/2022   Medicare Annual Wellness (AWV)  10/23/2022 (Originally 03/27/2022)   Zoster Vaccines- Shingrix (1 of 2) 12/23/2022 (Originally 02/02/1969)   COVID-19 Vaccine (3 - Pfizer risk series) 04/05/2023 (Originally 04/21/2019)   Colonoscopy  03/13/2023   DTaP/Tdap/Td (3 - Tdap) 10/29/2027   Pneumonia Vaccine 63+ Years old  Completed   DEXA SCAN  Completed   Hepatitis C Screening  Completed   HPV VACCINES  Aged Out     ----------------------------------------------------------------------------------------------------------------------------------------------------------------------------------------------------------------- Physical Exam BP 106/68 (BP Location: Left Arm, Patient Position: Sitting, Cuff Size: Normal)  Pulse 83   Ht 5\' 4"  (1.626 m)   Wt 174 lb (78.9 kg)   SpO2 98%   BMI 29.87 kg/m   Physical Exam Constitutional:      Appearance: Normal appearance.  Eyes:     General: No scleral icterus. Cardiovascular:     Rate and Rhythm: Normal rate and regular rhythm.  Pulmonary:     Effort: Pulmonary effort is normal.     Breath sounds: Normal breath sounds.  Musculoskeletal:     Cervical back: Neck supple.  Neurological:     Mental Status: She is alert.  Psychiatric:        Mood and Affect: Mood normal.        Behavior: Behavior normal.     ------------------------------------------------------------------------------------------------------------------------------------------------------------------------------------------------------------------- Assessment and Plan  Secondary hypercoagulable state (HCC) Continue Eliquis for anticoagulation.  Multiple thyroid nodules Seeing endocrinology.  Has had hyperthyroidism and remains on methimazole.  Stable at this time.  Plan on doing biopsy of nodule seen as she should be able to come off of Eliquis for a few days since she has  remained in normal sinus rhythm.  Essential hypertension Blood pressure well-controlled at this time.  She will continue current medications for management of her hypertension and cardiac disorders.  Chronic diastolic heart failure (HCC) Management per cardiology.  Remains on Aldactone, Farxiga and furosemide as needed.  Denies symptoms.  Appears euvolemic on exam.  Atrial fibrillation with RVR (HCC) She had recent ablation for A-fib.  She is off amiodarone but remains on metoprolol.  She has remained in normal sinus rhythm.  Denies symptoms at this time.  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   Meds ordered this encounter  Medications   traMADol (ULTRAM) 50 MG tablet    Sig: Take 1 tablet (50 mg total) by mouth every 8 (eight) hours as needed for up to 5 days.    Dispense:  15 tablet    Refill:  0    Return in about 6 months (around 03/23/2023) for Hypertension.    This visit occurred during the SARS-CoV-2 public health emergency.  Safety protocols were in place, including screening questions prior to the visit, additional usage of staff PPE, and extensive cleaning of exam room while observing appropriate contact time as indicated for disinfecting solutions.

## 2022-09-23 NOTE — Assessment & Plan Note (Signed)
Seeing endocrinology.  Has had hyperthyroidism and remains on methimazole.  Stable at this time.  Plan on doing biopsy of nodule seen as she should be able to come off of Eliquis for a few days since she has remained in normal sinus rhythm.

## 2022-09-23 NOTE — Assessment & Plan Note (Signed)
She had recent ablation for A-fib.  She is off amiodarone but remains on metoprolol.  She has remained in normal sinus rhythm.  Denies symptoms at this time.

## 2022-09-23 NOTE — Assessment & Plan Note (Signed)
This is managed by [facility].

## 2022-09-23 NOTE — Assessment & Plan Note (Signed)
Management per cardiology.  Remains on Aldactone, Farxiga and furosemide as needed.  Denies symptoms.  Appears euvolemic on exam.

## 2022-09-23 NOTE — Assessment & Plan Note (Signed)
Blood pressure well-controlled at this time.  She will continue current medications for management of her hypertension and cardiac disorders.

## 2022-09-23 NOTE — Assessment & Plan Note (Signed)
Continue Eliquis for anticoagulation

## 2022-09-24 ENCOUNTER — Encounter: Payer: Self-pay | Admitting: Cardiology

## 2022-10-25 DIAGNOSIS — B6013 Keratoconjunctivitis due to Acanthamoeba: Secondary | ICD-10-CM | POA: Diagnosis not present

## 2022-10-25 DIAGNOSIS — L718 Other rosacea: Secondary | ICD-10-CM | POA: Diagnosis not present

## 2022-10-25 DIAGNOSIS — H25813 Combined forms of age-related cataract, bilateral: Secondary | ICD-10-CM | POA: Diagnosis not present

## 2022-10-29 ENCOUNTER — Ambulatory Visit (INDEPENDENT_AMBULATORY_CARE_PROVIDER_SITE_OTHER): Payer: PPO | Admitting: Family Medicine

## 2022-10-29 VITALS — BP 118/81 | HR 90 | Ht 64.0 in | Wt 172.0 lb

## 2022-10-29 DIAGNOSIS — D485 Neoplasm of uncertain behavior of skin: Secondary | ICD-10-CM | POA: Diagnosis not present

## 2022-10-29 NOTE — Progress Notes (Signed)
Anna Marquez - 72 y.o. female MRN [id number]  Date of birth: 1950-11-18  Subjective Chief Complaint  Patient presents with   Insect Bite   Melanoma    HPI Anna Marquez is a 72 year old female here today with complaint of skin lesion.  First noticed a about a month ago.  Started as a small lesion but has grown to a raised tender lesion.  There is some scaly skin over top of area.  She has tried multiple over-the-counter treatments without much improvement.  There has not been drainage from the area.  Reports family history of melanoma and several family members.  ROS:  A comprehensive ROS was completed and negative except as noted per HPI  Allergies  Allergen Reactions   Erythromycin     Upset stomach   Nsaids Other (See Comments)    Has a history of bleeding ulcers, tolerates aspirin    Penicillins Hives and Shortness Of Breath    Did it involve swelling of the face/tongue/throat, SOB, or low BP? Yes Did it involve sudden or severe rash/hives, skin peeling, or any reaction on the inside of your mouth or nose? No Did you need to seek medical attention at a hospital or doctor's office? No When did it last happen?      45 years If all above answers are "NO", may proceed with cephalosporin use.     Past Medical History:  Diagnosis Date   Adenomatous colon polyp 2003   Cholelithiasis    COVID    Diverticulosis of colon    Gastric polyps    GERD (gastroesophageal reflux disease)    Helicobacter pylori (H. pylori)    Hiatal hernia    Hypertension    Mitral valve prolapse    Nephrolithiasis    Osteoarthritis    Osteopenia 03/2017   T score -1.5 FRAX 8.9% / 0.9%.  Stable from prior DEXA 2016   Persistent atrial fibrillation (HCC)    TGA (transient global amnesia)     Past Surgical History:  Procedure Laterality Date   ANTERIOR AND POSTERIOR REPAIR N/A 06/14/2014   Procedure: ANTERIOR (CYSTOCELE) AND POSTERIOR REPAIR (RECTOCELE);  Surgeon: Dara Lords, MD;   Location: [facility];  Service: Gynecology;  Laterality: N/A;   ATRIAL FIBRILLATION ABLATION N/A 02/11/2019   Procedure: ATRIAL FIBRILLATION ABLATION;  Surgeon: Hillis Range, MD;  Location: [facility];  Service: Cardiovascular;  Laterality: N/A;   ATRIAL FIBRILLATION ABLATION N/A 11/23/2021   Procedure: ATRIAL FIBRILLATION ABLATION;  Surgeon: Regan Lemming, MD;  Location: [facility];  Service: Cardiovascular;  Laterality: N/A;   CARDIOVERSION N/A 01/15/2019   Procedure: CARDIOVERSION;  Surgeon: Pricilla Riffle, MD;  Location: [facility] ENDOSCOPY;  Service: Cardiovascular;  Laterality: N/A;   CARDIOVERSION N/A 07/09/2021   Procedure: CARDIOVERSION;  Surgeon: Laurey Morale, MD;  Location: [facility] ENDOSCOPY;  Service: Cardiovascular;  Laterality: N/A;   CHOLECYSTECTOMY     kidney stone removal     x2   VAGINAL HYSTERECTOMY N/A 06/14/2014   Procedure: HYSTERECTOMY VAGINAL;  Surgeon: Dara Lords, MD;  Location: [facility];  Service: Gynecology;  Laterality: N/A;    Social History   Socioeconomic History   Marital status: Divorced    Spouse name: Not on file   Number of children: 2   Years of education: 12th grade   Highest education level: 12th grade  Occupational History   Occupation: Retired,  Tobacco Use   Smoking status: Never   Smokeless tobacco: Never  Tobacco comments:    Never smoke 07/16/21  Vaping Use   Vaping status: Never Used  Substance and Sexual Activity   Alcohol use: Never    Alcohol/week: 0.0 standard drinks of alcohol   Drug use: No   Sexual activity: Yes    Birth control/protection: Post-menopausal    Comment: 1st intercourse 72 yo-Fewer than 5 partners  Other Topics Concern   Not on file  Social History Narrative   Lives along with her dog. She enjoys croteching, crafts, making wreaths, reading and gardening in her free time.    Social Determinants of Health   Financial Resource Strain: Low Risk  (10/29/2022)   Overall Financial Resource Strain  (CARDIA)    Difficulty of Paying Living Expenses: Not very hard  Recent Concern: Financial Resource Strain - Medium Risk (09/23/2022)   Overall Financial Resource Strain (CARDIA)    Difficulty of Paying Living Expenses: Somewhat hard  Food Insecurity: No Food Insecurity (10/29/2022)   Hunger Vital Sign    Worried About Running Out of Food in the Last Year: Never true    Ran Out of Food in the Last Year: Never true  Transportation Needs: No Transportation Needs (10/29/2022)   PRAPARE - Administrator, Civil Service (Medical): No    Lack of Transportation (Non-Medical): No  Physical Activity: Insufficiently Active (09/23/2022)   Exercise Vital Sign    Days of Exercise per Week: 4 days    Minutes of Exercise per Session: 30 min  Stress: No Stress Concern Present (09/23/2022)   [employer] of Occupational Health - Occupational Stress Questionnaire    Feeling of Stress : Only a little  Social Connections: Moderately Integrated (10/29/2022)   Social Connection and Isolation Panel [NHANES]    Frequency of Communication with Friends and Family: More than three times a week    Frequency of Social Gatherings with Friends and Family: More than three times a week    Attends Religious Services: More than 4 times per year    Active Member of Clubs or Organizations: Yes    Attends Engineer, structural: More than 4 times per year    Marital Status: Divorced    Family History  Problem Relation Age of Onset   Cancer Father        prostate   Breast cancer Paternal Aunt 45   Diabetes Maternal Grandmother    Heart disease Maternal Grandmother    Heart disease Maternal Grandfather    Melanoma Paternal Aunt    Neuropathy Mother    Congestive Heart Failure Mother    Thyroid disease Daughter     Health Maintenance  Topic Date Due   Medicare Annual Wellness (AWV)  03/27/2022   MAMMOGRAM  07/27/2022   Zoster Vaccines- Shingrix (1 of 2) 12/23/2022 (Originally 02/02/1969)    COVID-19 Vaccine (3 - Pfizer risk series) 04/05/2023 (Originally 04/21/2019)   Colonoscopy  03/13/2023   DTaP/Tdap/Td (3 - Tdap) 10/29/2027   Pneumonia Vaccine 34+ Years old  Completed   INFLUENZA VACCINE  Completed   DEXA SCAN  Completed   Hepatitis C Screening  Completed   HPV VACCINES  Aged Out     ----------------------------------------------------------------------------------------------------------------------------------------------------------------------------------------------------------------- Physical Exam BP 118/81 (BP Location: Left Arm, Patient Position: Sitting, Cuff Size: Normal)   Pulse 90   Ht 5\' 4"  (1.626 m)   Wt 172 lb (78 kg)   SpO2 97%   BMI 29.52 kg/m   Physical Exam Constitutional:      Appearance: Normal appearance.  Cardiovascular:     Rate and Rhythm: Normal rate.  Musculoskeletal:     Cervical back: Neck supple.  Skin:    Comments: Raised, slightly tender lesion to left shin.  Neurological:     Mental Status: She is alert.     ------------------------------------------------------------------------------------------------------------------------------------------------------------------------------------------------------------------- Assessment and Plan  Neoplasm of uncertain behavior of skin Concern for keratoacanthoma given fairly rapid growth over the past month.  Referral placed to dermatology for further evaluation as differential does include squamous cell and basal cell carcinoma.   No orders of the defined types were placed in this encounter.   No follow-ups on file.    This visit occurred during the SARS-CoV-2 public health emergency.  Safety protocols were in place, including screening questions prior to the visit, additional usage of staff PPE, and extensive cleaning of exam room while observing appropriate contact time as indicated for disinfecting solutions.

## 2022-10-29 NOTE — Assessment & Plan Note (Signed)
Concern for keratoacanthoma given fairly rapid growth over the past month.  Referral placed to dermatology for further evaluation as differential does include squamous cell and basal cell carcinoma.

## 2022-11-13 ENCOUNTER — Ambulatory Visit: Payer: PPO

## 2022-11-22 ENCOUNTER — Telehealth: Payer: Self-pay | Admitting: Physician Assistant

## 2022-11-22 ENCOUNTER — Telehealth: Payer: Self-pay | Admitting: Sports Medicine

## 2022-11-22 DIAGNOSIS — F4321 Adjustment disorder with depressed mood: Secondary | ICD-10-CM | POA: Insufficient documentation

## 2022-11-22 MED ORDER — ALPRAZOLAM 0.5 MG PO TABS: 0.5000 mg | ORAL_TABLET | Freq: Three times a day (TID) | ORAL | 0 refills | Status: DC | PRN

## 2022-11-22 MED ORDER — ALPRAZOLAM 0.5 MG PO TABS
0.5000 mg | ORAL_TABLET | Freq: Three times a day (TID) | ORAL | 0 refills | Status: DC | PRN
Start: 2022-11-22 — End: 2022-11-22

## 2022-11-22 NOTE — Telephone Encounter (Signed)
This is an exquisitely pleasant 72 year old female, her daughter recently suffered a cardiac arrest, anoxic encephalopathy and spite of bystander CPR, defibrillation and medication.  She was kept alive in the ICU on ventilator and recently had organ procurement.  The daughter passed and her mother, Anna Marquez is distraught as expected.  She called the on-call line hoping for something to ease some of the pain, I spoke to her in detail about her support system as well as her desires for grief counseling.  We have agreed to use alprazolam 0.5 mg 1-2 tabs 3 times daily as needed for anxiety and grieving, I will also place a referral for grief counseling first available in our office, I contacted the pharmacist at the closest pharmacy to her location to ensure they can have the prescription ready for her tonight, and then I called the patient back to let her know to go to [facility] on [address].  She can follow-up with either myself or Dr. Ashley Royalty in a week or 2 to see how things are going. Patient endorses no suicidal or homicidal ideation.

## 2022-11-22 NOTE — Telephone Encounter (Signed)
Patient contacted cardiology after hour answering service to discuss what type of medication would help her sleep.  Unfortunately she recently lost her daughter and she is mourning for the loss of loved one and is quite anxious.  Unfortunately none of the over-the-counter medication likely will help in this case.  I suspect patient will benefit from anxiolytic medication.  This should be prescribed by primary care provider.  There is no interaction between her cardiac medication and the anxiolytic medication.

## 2022-11-26 NOTE — Telephone Encounter (Signed)
Can we get Anna Marquez in to to see me in the next week or two?

## 2022-11-29 ENCOUNTER — Other Ambulatory Visit (HOSPITAL_COMMUNITY): Payer: Self-pay | Admitting: Cardiology

## 2022-12-02 ENCOUNTER — Encounter: Payer: Self-pay | Admitting: Family Medicine

## 2022-12-02 ENCOUNTER — Ambulatory Visit (INDEPENDENT_AMBULATORY_CARE_PROVIDER_SITE_OTHER): Payer: PPO | Admitting: Family Medicine

## 2022-12-02 VITALS — BP 101/72 | HR 118 | Ht 64.0 in | Wt 170.0 lb

## 2022-12-02 DIAGNOSIS — Z634 Disappearance and death of family member: Secondary | ICD-10-CM | POA: Insufficient documentation

## 2022-12-02 DIAGNOSIS — F4321 Adjustment disorder with depressed mood: Secondary | ICD-10-CM | POA: Insufficient documentation

## 2022-12-02 MED ORDER — ALPRAZOLAM 0.5 MG PO TABS: 0.5000 mg | ORAL_TABLET | Freq: Two times a day (BID) | ORAL | 1 refills | Status: DC | PRN

## 2022-12-02 NOTE — Progress Notes (Signed)
Anna Marquez - 72 y.o. female MRN [id number]  Date of birth: [birth year]  Subjective Chief Complaint  Patient presents with   Grief    HPI Anna Marquez is a 72 year old female here today to discuss grief.  Sadly her daughter passed away unexpectedly recently.  She has had grief related to this as well as some difficulty finding closure due to interaction with her daughter's partner has not really allowed her to access her daughter's personal items.  She called our on-call line recently due to significant insomnia and Dr. Benjamin Stain called in alprazolam for her.  She has found this to be helpful and is trying to not use this too often.  She would be interested in seeing a counselor for therapy.  She has good support from other family members.  ROS:  A comprehensive ROS was completed and negative except as noted per HPI  Allergies  Allergen Reactions   Erythromycin     Upset stomach   Nsaids Other (See Comments)    Has a history of bleeding ulcers, tolerates aspirin    Penicillins Hives and Shortness Of Breath    Did it involve swelling of the face/tongue/throat, SOB, or low BP? Yes Did it involve sudden or severe rash/hives, skin peeling, or any reaction on the inside of your mouth or nose? No Did you need to seek medical attention at a hospital or doctor's office? No When did it last happen?      45 years If all above answers are "NO", may proceed with cephalosporin use.     Past Medical History:  Diagnosis Date   Adenomatous colon polyp 2003   Cholelithiasis    COVID    Diverticulosis of colon    Gastric polyps    GERD (gastroesophageal reflux disease)    Helicobacter pylori (H. pylori)    Hiatal hernia    Hypertension    Mitral valve prolapse    Nephrolithiasis    Osteoarthritis    Osteopenia 03/2017   T score -1.5 FRAX 8.9% / 0.9%.  Stable from prior DEXA 2016   Persistent atrial fibrillation (HCC)    TGA (transient global amnesia)     Past Surgical History:   Procedure Laterality Date   ANTERIOR AND POSTERIOR REPAIR N/A 06/14/2014   Procedure: ANTERIOR (CYSTOCELE) AND POSTERIOR REPAIR (RECTOCELE);  Surgeon: Dara Lords, MD;  Location: [facility];  Service: Gynecology;  Laterality: N/A;   ATRIAL FIBRILLATION ABLATION N/A 02/11/2019   Procedure: ATRIAL FIBRILLATION ABLATION;  Surgeon: Hillis Range, MD;  Location: [facility];  Service: Cardiovascular;  Laterality: N/A;   ATRIAL FIBRILLATION ABLATION N/A 11/23/2021   Procedure: ATRIAL FIBRILLATION ABLATION;  Surgeon: Regan Lemming, MD;  Location: [facility];  Service: Cardiovascular;  Laterality: N/A;   CARDIOVERSION N/A 01/15/2019   Procedure: CARDIOVERSION;  Surgeon: Pricilla Riffle, MD;  Location: [facility] ENDOSCOPY;  Service: Cardiovascular;  Laterality: N/A;   CARDIOVERSION N/A 07/09/2021   Procedure: CARDIOVERSION;  Surgeon: Laurey Morale, MD;  Location: [facility] ENDOSCOPY;  Service: Cardiovascular;  Laterality: N/A;   CHOLECYSTECTOMY     kidney stone removal     x2   VAGINAL HYSTERECTOMY N/A 06/14/2014   Procedure: HYSTERECTOMY VAGINAL;  Surgeon: Dara Lords, MD;  Location: [facility];  Service: Gynecology;  Laterality: N/A;    Social History   Socioeconomic History   Marital status: Divorced    Spouse name: Not on file   Number of children: 2   Years of  education: 12th grade   Highest education level: 12th grade  Occupational History   Occupation: Retired,  Tobacco Use   Smoking status: Never   Smokeless tobacco: Never   Tobacco comments:    Never smoke 07/16/21  Vaping Use   Vaping status: Never Used  Substance and Sexual Activity   Alcohol use: Never    Alcohol/week: 0.0 standard drinks of alcohol   Drug use: No   Sexual activity: Yes    Birth control/protection: Post-menopausal    Comment: 1st intercourse 72 yo-Fewer than 5 partners  Other Topics Concern   Not on file  Social History Narrative   Lives along with her dog. She enjoys croteching, crafts, making  wreaths, reading and gardening in her free time.    Social Determinants of Health   Financial Resource Strain: Low Risk  (10/29/2022)   Overall Financial Resource Strain (CARDIA)    Difficulty of Paying Living Expenses: Not very hard  Recent Concern: Financial Resource Strain - Medium Risk (09/23/2022)   Overall Financial Resource Strain (CARDIA)    Difficulty of Paying Living Expenses: Somewhat hard  Food Insecurity: No Food Insecurity (10/29/2022)   Hunger Vital Sign    Worried About Running Out of Food in the Last Year: Never true    Ran Out of Food in the Last Year: Never true  Transportation Needs: No Transportation Needs (10/29/2022)   PRAPARE - Administrator, Civil Service (Medical): No    Lack of Transportation (Non-Medical): No  Physical Activity: Insufficiently Active (09/23/2022)   Exercise Vital Sign    Days of Exercise per Week: 4 days    Minutes of Exercise per Session: 30 min  Stress: No Stress Concern Present (09/23/2022)   [employer] of Occupational Health - Occupational Stress Questionnaire    Feeling of Stress : Only a little  Social Connections: Moderately Integrated (10/29/2022)   Social Connection and Isolation Panel [NHANES]    Frequency of Communication with Friends and Family: More than three times a week    Frequency of Social Gatherings with Friends and Family: More than three times a week    Attends Religious Services: More than 4 times per year    Active Member of Clubs or Organizations: Yes    Attends Engineer, structural: More than 4 times per year    Marital Status: Divorced    Family History  Problem Relation Age of Onset   Cancer Father        prostate   Breast cancer Paternal Aunt 45   Diabetes Maternal Grandmother    Heart disease Maternal Grandmother    Heart disease Maternal Grandfather    Melanoma Paternal Aunt    Neuropathy Mother    Congestive Heart Failure Mother    Thyroid disease Daughter     Health  Maintenance  Topic Date Due   Medicare Annual Wellness (AWV)  03/27/2022   MAMMOGRAM  07/27/2022   Colonoscopy  03/13/2023   Zoster Vaccines- Shingrix (1 of 2) 12/23/2022 (Originally 02/02/1968)   COVID-19 Vaccine (3 - Pfizer risk series) 04/05/2023 (Originally 04/21/2019)   DTaP/Tdap/Td (3 - Tdap) 10/29/2027   Pneumonia Vaccine 71+ Years old  Completed   INFLUENZA VACCINE  Completed   DEXA SCAN  Completed   Hepatitis C Screening  Completed   HPV VACCINES  Aged Out     ----------------------------------------------------------------------------------------------------------------------------------------------------------------------------------------------------------------- Physical Exam BP 101/72 (BP Location: Left Arm, Patient Position: Sitting, Cuff Size: Normal)   Pulse (!) 118   Ht  5\' 4"  (1.626 m)   Wt 170 lb (77.1 kg)   SpO2 95%   BMI 29.18 kg/m   Physical Exam Constitutional:      Appearance: Normal appearance.  HENT:     Head: Normocephalic and atraumatic.  Musculoskeletal:     Cervical back: Neck supple.  Neurological:     Mental Status: She is alert.  Psychiatric:     Comments: Appropriately tearful.     ------------------------------------------------------------------------------------------------------------------------------------------------------------------------------------------------------------------- Assessment and Plan  Grief at loss of child We discussed how grief effects everyone differently however [place] tend to follow stages of grief.  Offered condolences and support.  She may continue alprazolam for now as needed to help with sleep and significant anxiety.  Referral placed to therapist and we discussed grief share counseling that is available in the community.   Meds ordered this encounter  Medications   ALPRAZolam (XANAX) 0.5 MG tablet    Sig: Take 1-2 tablets (0.5-1 mg total) by mouth 2 (two) times daily as needed for anxiety or sleep.     Dispense:  30 tablet    Refill:  1    No follow-ups on file.    This visit occurred during the SARS-CoV-2 public health emergency.  Safety protocols were in place, including screening questions prior to the visit, additional usage of staff PPE, and extensive cleaning of exam room while observing appropriate contact time as indicated for disinfecting solutions.

## 2022-12-02 NOTE — Assessment & Plan Note (Signed)
We discussed how grief effects everyone differently however Anna Marquez tend to follow stages of grief.  Offered condolences and support.  She may continue alprazolam for now as needed to help with sleep and significant anxiety.  Referral placed to therapist and we discussed grief share counseling that is available in the community.

## 2022-12-11 DIAGNOSIS — E0590 Thyrotoxicosis, unspecified without thyrotoxic crisis or storm: Secondary | ICD-10-CM | POA: Diagnosis not present

## 2022-12-12 ENCOUNTER — Ambulatory Visit: Payer: PPO | Admitting: Dermatology

## 2022-12-12 ENCOUNTER — Encounter: Payer: Self-pay | Admitting: Dermatology

## 2022-12-12 VITALS — BP 120/86 | HR 81

## 2022-12-12 DIAGNOSIS — Z808 Family history of malignant neoplasm of other organs or systems: Secondary | ICD-10-CM | POA: Diagnosis not present

## 2022-12-12 DIAGNOSIS — C44729 Squamous cell carcinoma of skin of left lower limb, including hip: Secondary | ICD-10-CM

## 2022-12-12 DIAGNOSIS — D489 Neoplasm of uncertain behavior, unspecified: Secondary | ICD-10-CM

## 2022-12-12 DIAGNOSIS — D492 Neoplasm of unspecified behavior of bone, soft tissue, and skin: Secondary | ICD-10-CM | POA: Diagnosis not present

## 2022-12-12 DIAGNOSIS — C4492 Squamous cell carcinoma of skin, unspecified: Secondary | ICD-10-CM

## 2022-12-12 HISTORY — DX: Squamous cell carcinoma of skin, unspecified: C44.92

## 2022-12-12 NOTE — Patient Instructions (Signed)

## 2022-12-12 NOTE — Progress Notes (Signed)
   Follow-Up Visit   Subjective  Anna Marquez is a 72 y.o. female who presents for the following: Spot check for a lesion on left leg.  No Hx of skin cancer.  Family hx of MM. Lesion has been present of left lower leg for about 5 weeks.  The patient has spots, moles and lesions to be evaluated, some may be new or changing and the patient may have concern these could be cancer.  The following portions of the chart were reviewed this encounter and updated as appropriate: medications, allergies, medical history  Review of Systems:  No other skin or systemic complaints except as noted in HPI or Assessment and Plan.  Objective  Well appearing patient in no apparent distress; mood and affect are within normal limits.   A focused examination was performed of the following areas:  Left leg  Relevant exam findings are noted in the Assessment and Plan.  Left Lower Leg - Anterior      Assessment & Plan    Neoplasm of uncertain behavior Left Lower Leg - Anterior  Skin / nail biopsy Type of biopsy: tangential   Informed consent: discussed and consent obtained   Timeout: patient name, date of birth, surgical site, and procedure verified   Anesthesia: the lesion was anesthetized in a standard fashion   Anesthetic:  1% lidocaine w/ epinephrine 1-100,000 buffered w/ 8.4% NaHCO3 Instrument used: DermaBlade   Hemostasis achieved with: aluminum chloride and electrodesiccation   Outcome: patient tolerated procedure well   Post-procedure details: sterile dressing applied and wound care instructions given   Dressing type: bandage and petrolatum   Additional details:  2.5 x 2.3    Return for mohs surgery.  Dominga Ferry, Surg Tech III, am acting as scribe for Gwenith Daily, MD.   Documentation: I have reviewed the above documentation for accuracy and completeness, and I agree with the above.  Gwenith Daily, MD

## 2022-12-14 ENCOUNTER — Other Ambulatory Visit: Payer: Self-pay | Admitting: Cardiology

## 2022-12-14 DIAGNOSIS — I4819 Other persistent atrial fibrillation: Secondary | ICD-10-CM

## 2022-12-16 LAB — SURGICAL PATHOLOGY

## 2022-12-18 DIAGNOSIS — E042 Nontoxic multinodular goiter: Secondary | ICD-10-CM | POA: Diagnosis not present

## 2022-12-18 DIAGNOSIS — E0590 Thyrotoxicosis, unspecified without thyrotoxic crisis or storm: Secondary | ICD-10-CM | POA: Diagnosis not present

## 2023-01-03 ENCOUNTER — Encounter: Payer: Self-pay | Admitting: Dermatology

## 2023-01-06 ENCOUNTER — Ambulatory Visit: Payer: PPO | Admitting: Dermatology

## 2023-01-06 ENCOUNTER — Encounter: Payer: Self-pay | Admitting: Dermatology

## 2023-01-06 VITALS — BP 133/80 | HR 100

## 2023-01-06 DIAGNOSIS — L814 Other melanin hyperpigmentation: Secondary | ICD-10-CM

## 2023-01-06 DIAGNOSIS — D492 Neoplasm of unspecified behavior of bone, soft tissue, and skin: Secondary | ICD-10-CM | POA: Diagnosis not present

## 2023-01-06 DIAGNOSIS — D485 Neoplasm of uncertain behavior of skin: Secondary | ICD-10-CM

## 2023-01-06 DIAGNOSIS — C4492 Squamous cell carcinoma of skin, unspecified: Secondary | ICD-10-CM

## 2023-01-06 DIAGNOSIS — C44729 Squamous cell carcinoma of skin of left lower limb, including hip: Secondary | ICD-10-CM | POA: Diagnosis not present

## 2023-01-06 DIAGNOSIS — L579 Skin changes due to chronic exposure to nonionizing radiation, unspecified: Secondary | ICD-10-CM | POA: Diagnosis not present

## 2023-01-06 NOTE — Progress Notes (Signed)
Follow-Up Visit   Subjective  Anna Marquez is a 72 y.o. female who presents for the following: Mohs of SCC- KA type on the left lower leg, biopsied by Dr. Caralyn Guile  She also has a lesion of concern on her left lower leg, present for several months and painful, not previously treated.   The following portions of the chart were reviewed this encounter and updated as appropriate: medications, allergies, medical history  Review of Systems:  No other skin or systemic complaints except as noted in HPI or Assessment and Plan.  Objective  Well appearing patient in no apparent distress; mood and affect are within normal limits.  A focused examination was performed of the following areas: Left lower leg Relevant physical exam findings are noted in the Assessment and Plan.     Assessment & Plan   SQUAMOUS CELL CARCINOMA OF SKIN Left Lower Leg - Anterior Mohs surgery   Anesthesia: Anesthesia method: local infiltration Local anesthetic: lidocaine 1% WITH epi  Procedure Details: Timeout: pre-procedure verification complete Procedure Prep: patient was prepped and draped in usual sterile fashion Prep type: chlorhexidine Pre-Op diagnosis: squamous cell carcinoma Surgical site (from skin exam): Left Lower Leg - Anterior Pre-operative length (cm): 3 Pre-operative width (cm): 2.8 Indications for Mohs surgery: anatomic location where tissue conservation is critical and tumor size greater than 2 cm  Micrographic Surgery Details:  Patient tolerance of procedure: tolerated well, no immediate complications NEOPLASM OF UNCERTAIN BEHAVIOR OF SKIN Left Lower Leg near ankle Skin / nail biopsy Type of biopsy: tangential   Timeout: patient name, date of birth, surgical site, and procedure verified   Procedure prep:  Patient was prepped and draped in usual sterile fashion Prep type:  Isopropyl alcohol Anesthesia: the lesion was anesthetized in a standard fashion   Anesthetic:  1% lidocaine w/  epinephrine 1-100,000 buffered w/ 8.4% NaHCO3 Instrument used: DermaBlade   Hemostasis achieved with: pressure, aluminum chloride and electrodesiccation   Outcome: patient tolerated procedure well   Post-procedure details: sterile dressing applied and wound care instructions given   Specimen 1 - Surgical pathology Differential Diagnosis: R/o NMSC  Check Margins: No   Return in about 4 weeks (around 02/03/2023) for wound check.  Owens Shark, CMA, am acting as scribe for Gwenith Daily, MD.    01/06/2023  HISTORY OF PRESENT ILLNESS  Anna Marquez is seen in consultation at the request of Dr. Caralyn Guile for biopsy-proven SCC KA type on the left lower leg. They note that the area has been present for about 2 months increasing in size with time.  There is no history of previous treatment.  Reports no other new or changing lesions and has no other complaints today.  Medications and allergies: see patient chart.  Review of systems: Reviewed 8 systems and notable for the above skin cancer.  All other systems reviewed are unremarkable/negative, unless noted in the HPI. Past medical history, surgical history, family history, social history were also reviewed and are noted in the chart/questionnaire.    PHYSICAL EXAMINATION  General: Well-appearing, in no acute distress, alert and oriented x 4. Vitals reviewed in chart (if available).   Skin: Exam reveals a 3.0 x 2.8 cm erythematous papule and biopsy scar on the left lower leg. There are rhytids, telangiectasias, and lentigines, consistent with photodamage.  Biopsy report(s) reviewed, confirming the diagnosis.   ASSESSMENT  1) Squamous Cell Carcinoma- Keratoacanthoma type 2) photodamage 3) solar lentigines   PLAN   1. Due to location,  size, histology, or recurrence and the likelihood of subclinical extension as well as the need to conserve normal surrounding tissue, the patient was deemed acceptable for Mohs micrographic surgery (MMS).   The nature and purpose of the procedure, associated benefits and risks including recurrence and scarring, possible complications such as pain, infection, and bleeding, and alternative methods of treatment if appropriate were discussed with the patient during consent. The lesion location was verified by the patient, by reviewing previous notes, pathology reports, and by photographs as well as angulation measurements if available.  Informed consent was reviewed and signed by the patient, and timeout was performed at 10:45 AM. See op note below.  2. For the photodamage and solar lentigines, sun protection discussed/information given on OTC sunscreens, and we recommend continued regular follow-up with primary dermatologist every 6 months or sooner for any growing, bleeding, or changing lesions. 3. Prognosis and future surveillance discussed. 4. Letter with treatment outcome sent to referring provider. 5. Pain acetaminophen/ibuprofen  MOHS MICROGRAPHIC SURGERY AND RECONSTRUCTION  Initial size:   3.0 x 2.8 cm Surgical defect/wound size: 3.5 x 3.0 cm Anesthesia:    0.33% lidocaine with 1:200,000 epinephrine EBL:    <5 mL Complications:  None Repair type:   Secondary Intention  Stages: 1  STAGE I: Anesthesia achieved with 0.5% lidocaine with 1:200,000 epinephrine. ChloraPrep applied. 1 section(s) excised using Mohs technique (this includes total peripheral and deep tissue margin excision and evaluation with frozen sections, excised and interpreted by the same physician). The tumor was first debulked and then excised with an approx. 2mm margin.  Hemostasis was achieved with electrocautery as needed.  The specimen was then oriented, subdivided/relaxed, inked, and processed using Mohs technique.    Frozen section analysis revealed a clear deep and peripheral margin.  Reconstruction  Patient was notified of results and repair options were discussed, including second intention healing. After reviewing the  advantages and disadvantages of each, we agreed on second intention healing as appropriate.   The surgical site was then lightly scrubbed with sterile, saline-soaked gauze.  The area was bandaged using Vaseline ointment, non-adherent gauze, gauze pads, and tape to provide an adequate pressure dressing.   The patient tolerated the procedure well, was given detailed written and verbal wound care instructions, and was discharged in good condition.  The patient will follow-up in 4 weeks and as scheduled with primary dermatologist.  Documentation: I have reviewed the above documentation for accuracy and completeness, and I agree with the above.  Gwenith Daily, MD

## 2023-01-06 NOTE — Patient Instructions (Signed)

## 2023-01-07 LAB — SURGICAL PATHOLOGY

## 2023-01-14 ENCOUNTER — Telehealth: Payer: Self-pay

## 2023-01-14 NOTE — Telephone Encounter (Signed)
 Advised patient of results and will schedule for Mohs with Dr Paci/hd

## 2023-01-14 NOTE — Telephone Encounter (Signed)
-----   Message from [facility] PACI sent at 01/14/2023 10:10 AM EST ----- -SCC-KA-left lower leg- Mohs with me  Please call patient to discuss diagnosis and schedule for Mohs surgery.

## 2023-01-16 ENCOUNTER — Other Ambulatory Visit: Payer: Self-pay | Admitting: Family Medicine

## 2023-01-16 DIAGNOSIS — F4321 Adjustment disorder with depressed mood: Secondary | ICD-10-CM

## 2023-01-21 ENCOUNTER — Encounter: Payer: Self-pay | Admitting: Dermatology

## 2023-01-22 ENCOUNTER — Encounter: Payer: Self-pay | Admitting: Dermatology

## 2023-01-22 ENCOUNTER — Ambulatory Visit: Payer: PPO | Admitting: Dermatology

## 2023-01-22 VITALS — BP 134/68 | HR 79 | Temp 98.9°F

## 2023-01-22 DIAGNOSIS — L905 Scar conditions and fibrosis of skin: Secondary | ICD-10-CM | POA: Diagnosis not present

## 2023-01-22 DIAGNOSIS — C4492 Squamous cell carcinoma of skin, unspecified: Secondary | ICD-10-CM

## 2023-01-22 DIAGNOSIS — Z85828 Personal history of other malignant neoplasm of skin: Secondary | ICD-10-CM

## 2023-01-22 DIAGNOSIS — T1490XD Injury, unspecified, subsequent encounter: Secondary | ICD-10-CM

## 2023-01-22 DIAGNOSIS — C44729 Squamous cell carcinoma of skin of left lower limb, including hip: Secondary | ICD-10-CM

## 2023-01-22 MED ORDER — MUPIROCIN 2 % EX OINT: 1.0000 | TOPICAL_OINTMENT | Freq: Two times a day (BID) | CUTANEOUS | 3 refills | Status: DC

## 2023-01-22 NOTE — Patient Instructions (Signed)

## 2023-01-22 NOTE — Progress Notes (Addendum)
 Follow-Up Visit   Subjective  Anna Marquez is a 73 y.o. female who presents for the following: Mohs for a Squamous Cell Carcinoma- KA type, biopsied by Dr. Fain Home, on the left lower leg near ankle.  She is also continuing to heal from a Mohs surgery procedure, on 01/06/23, for an SCC KA type on her left shin. She is using mupirocin  ointment and covering the wound.   The following portions of the chart were reviewed this encounter and updated as appropriate: medications, allergies, medical history  Review of Systems:  No other skin or systemic complaints except as noted in HPI or Assessment and Plan.  Objective  Well appearing patient in no apparent distress; mood and affect are within normal limits.  A focused examination was performed of the following areas: Left lower leg near ankle Relevant physical exam findings are noted in the Assessment and Plan.     Assessment & Plan   SQUAMOUS CELL CARCINOMA OF SKIN Left Lower Leg near ankle Mohs surgery  Consent obtained: written  Anticoagulation: Is the patient taking prescription anticoagulant and/or aspirin  prescribed/recommended by a physician? Yes   Was the anticoagulation regimen changed prior to Mohs? No    Procedure Details: Surgical site (from skin exam): Left Lower Leg near ankle Pre-operative length (cm): 1.7 Pre-operative width (cm): 1.8  Micrographic Surgery Details: Post-operative length (cm): 2 Post-operative width (cm): 2 Number of Mohs stages: 2 HEALING WOUND    Scar s/p Mohs for SCC-KA type on the left shin, treated on 01/06/23, healing by secondary intention - Reassured that wound has healed well - Discussed that scars take up to 12 months to mature from the date of surgery - Recommend SPF 30+ to scar daily to prevent purple color - Continue mupirocin /vaseline daily  Return in about 4 weeks (around 02/19/2023) for wound check.  Flora Humphreys, CMA, am acting as scribe for Deneise Finlay, MD.     01/22/2023  HISTORY OF PRESENT ILLNESS  Anna Marquez is seen in consultation at the request of Dr.  for biopsy-proven for Squamous Cell Carcinoma- KA type of the left lower leg . They note that the area has been present for about 2 months increasing in size with time.  There is no history of previous treatment.  Reports no other new or changing lesions and has no other complaints today.  Medications and allergies: see patient chart.  Review of systems: Reviewed 8 systems and notable for the above skin cancer.  All other systems reviewed are unremarkable/negative, unless noted in the HPI. Past medical history, surgical history, family history, social history were also reviewed and are noted in the chart/questionnaire.    PHYSICAL EXAMINATION  General: Well-appearing, in no acute distress, alert and oriented x 4. Vitals reviewed in chart (if available).   Skin: Exam reveals a 1.7 x 1.8 cm erythematous papule and biopsy scar on the left lower leg near ankle. There are rhytids, telangiectasias, and lentigines, consistent with photodamage.  Biopsy report(s) reviewed, confirming the diagnosis.   ASSESSMENT  1) Squamous Cell Carcinoma- KA Type- left lower leg near ankle 2) photodamage 3) solar lentigines   PLAN   1. Due to location, size, histology, or recurrence and the likelihood of subclinical extension as well as the need to conserve normal surrounding tissue, the patient was deemed acceptable for Mohs micrographic surgery (MMS).  The nature and purpose of the procedure, associated benefits and risks including recurrence and scarring, possible complications such as pain, infection,  and bleeding, and alternative methods of treatment if appropriate were discussed with the patient during consent. The lesion location was verified by the patient, by reviewing previous notes, pathology reports, and by photographs as well as angulation measurements if available.  Informed consent was reviewed  and signed by the patient, and timeout was performed at 8:30 AM. See op note below.  2. For the photodamage and solar lentigines, sun protection discussed/information given on OTC sunscreens, and we recommend continued regular follow-up with primary dermatologist every 6 months or sooner for any growing, bleeding, or changing lesions. 3. Prognosis and future surveillance discussed. 4. Letter with treatment outcome sent to referring provider. 5. Pain acetaminophen /ibuprofen  7. Nicotinamide- 500 mg daily  MOHS MICROGRAPHIC SURGERY AND RECONSTRUCTION  Initial size:   1.7 x 1.8 cm Surgical defect/wound size: 2.0 x 2.0 cm Anesthesia:    0.33% lidocaine  with 1:200,000 epinephrine  EBL:    <5 mL Complications:  None Repair type:   Secondary Intention   Stages: 2  STAGE I: Anesthesia achieved with 0.5% lidocaine  with 1:200,000 epinephrine . ChloraPrep applied. 1 section(s) excised using Mohs technique (this includes total peripheral and deep tissue margin excision and evaluation with frozen sections, excised and interpreted by the same physician). The tumor was first debulked and then excised with an approx. 2 mm margin.  Hemostasis was achieved with electrocautery as needed.  The specimen was then oriented, subdivided/relaxed, inked, and processed using Mohs technique.    Frozen section analysis revealed a positive margin for dense inflammatory cells with squamous atypia in the deep margin.    STAGE II: An additional 2 mm margin was excised.  Hemostasis was achieved with electrocautery as needed.  The specimen was then oriented, subdivided/relaxed, inked, and processed using Mohs technique. Evaluation of slides by the Mohs surgeon revealed clear tumor margins.  Frozen section analysis revealed a clear deep and peripheral margin.  Reconstruction  Patient was notified of results and repair options were discussed, including second intention healing. After reviewing the advantages and disadvantages  of each, we agreed on second intention healing as appropriate.   The surgical site was then lightly scrubbed with sterile, saline-soaked gauze.  The area was bandaged using Vaseline ointment, non-adherent gauze, gauze pads, and tape to provide an adequate pressure dressing.   The patient tolerated the procedure well, was given detailed written and verbal wound care instructions, and was discharged in good condition.  The patient will follow-up 4 weeks and as scheduled with primary dermatologist.  Documentation: I have reviewed the above documentation for accuracy and completeness, and I agree with the above.  Deneise Finlay, MD

## 2023-02-04 ENCOUNTER — Ambulatory Visit: Payer: PPO | Admitting: Dermatology

## 2023-02-05 ENCOUNTER — Ambulatory Visit: Payer: PPO | Admitting: Behavioral Health

## 2023-02-05 DIAGNOSIS — F4321 Adjustment disorder with depressed mood: Secondary | ICD-10-CM | POA: Diagnosis not present

## 2023-02-05 DIAGNOSIS — Z634 Disappearance and death of family member: Secondary | ICD-10-CM | POA: Diagnosis not present

## 2023-02-05 NOTE — Progress Notes (Signed)
Deneise Lever, LMFT

## 2023-02-05 NOTE — Progress Notes (Addendum)
[facility] Counselor Initial Adult Exam  Name: Anna Marquez Date: 02/05/2023 MRN: [id number] DOB: 1950/08/29 PCP: Everrett Coombe, DO  Time spent: 45 min In Person  @[facility]  - [facility] Time In: 10:30am (Pt was late today due to getting lost) Time Out: 11:10am  Guardian/Payee:  [employer]    Paperwork requested: No   Reason for Visit /Presenting Problem: Elevated anx/dep & grief due to tragic & unexpected death of 74yo Dtr Anna Marquez while @ work.   Mental Status Exam: Appearance:   Casual and Neat     Behavior:  Appropriate and Sharing  Motor:  Normal  Speech/Language:   Clear and Coherent  Affect:  Tearful  Mood:  sad  Thought process:  normal  Thought content:    WNL  Sensory/Perceptual disturbances:    WNL  Orientation:  oriented to person, place, time/date, and situation  Attention:  Good  Concentration:  Good  Memory:  WNL  Fund of knowledge:   Good  Insight:    Good  Judgment:   Good  Impulse Control:  Good    Risk Assessment: Danger to Self:  No Self-injurious Behavior: No Danger to Others: No Duty to Warn:no Physical Aggression / Violence:No  Access to Firearms a concern: No  Gang Involvement:No  Patient / guardian was educated about steps to take if suicide or homicide risk level increases between visits: yes; appropriate to ICD process While future psychiatric events cannot be accurately predicted, the patient does not currently require acute inpatient psychiatric care and does not currently meet [facility] involuntary commitment criteria.  Substance Abuse History: Current substance abuse: No     Past Psychiatric History:   No previous psychological problems have been observed Outpatient Providers: Everrett Coombe, DO History of Psych Hospitalization: No  Psychological Testing:  NA    Abuse History:  Victim of: No.,  NA    Report needed: No. Victim of Neglect:No. Perpetrator of  NA   Witness / Exposure to Domestic  Violence: No   Protective Services Involvement: No  Witness to [employer] Violence:  No   Family History:  Family History  Problem Relation Age of Onset   Cancer Father        prostate   Breast cancer Paternal Aunt 45   Diabetes Maternal Grandmother    Heart disease Maternal Grandmother    Heart disease Maternal Grandfather    Melanoma Paternal Aunt    Neuropathy Mother    Congestive Heart Failure Mother    Thyroid disease Daughter     Living situation: the patient lives alone  Sexual Orientation: Straight  Relationship Status: divorced  Name of spouse / other: Sig Other Anna Marquez If a parent, number of children / ages:50yo Son Anna Marquez & deceased 74yo Dtr Anna Marquez  Support Systems: significant other friends Family  Financial Stress:  No   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 She exp'd a sig health event of unk type & was revived @ the [employer], transported to [facility] in [place], & remained there ventilated for 12 days until her death. The Donor Team treated Pt "awful" during this process & no Autopsy was able to happen. Pt is  not aware of the cause of death.   Strengths: Supportive Relationships, Family, Friends, Church, Journalist, newspaper, and Able to Communicate Effectively  Barriers:  None noted today   Legal History: Pending legal issue / charges: The patient has no significant history of legal issues. History of legal issue / charges:  NA @ this time  Medical History/Surgical History: reviewed Past Medical History:  Diagnosis Date   Adenomatous colon polyp 2003   Cholelithiasis    COVID    Diverticulosis of colon     Gastric polyps    GERD (gastroesophageal reflux disease)    Helicobacter pylori (H. pylori)    Hiatal hernia    Hypertension    Mitral valve prolapse    Nephrolithiasis    Osteoarthritis    Osteopenia 03/2017   T score -1.5 FRAX 8.9% / 0.9%.  Stable from prior DEXA 2016   Persistent atrial fibrillation (HCC)    Squamous cell carcinoma of skin 12/12/2022   Left lower leg anterior (KA type) - Mohs 01/06/2023   Squamous cell carcinoma of skin 01/06/2023   Left lower leg near ankle (KA type) - needs Mohs   TGA (transient global amnesia)     Past Surgical History:  Procedure Laterality Date   ANTERIOR AND POSTERIOR REPAIR N/A 06/14/2014   Procedure: ANTERIOR (CYSTOCELE) AND POSTERIOR REPAIR (RECTOCELE);  Surgeon: Dara Lords, MD;  Location: [facility] OR[facility];  Service: Gynecology;  Laterality: N/A;   ATRIAL FIBRILLATION ABLATION N/A 02/11/2019   Procedure: ATRIAL FIBRILLATION ABLATION;  Surgeon: Hillis Range, MD;  Location: [facility] INVASIVE CV LAB;  Service: Cardiovascular;  Laterality: N/A;   ATRIAL FIBRILLATION ABLATION N/A 11/23/2021   Procedure: ATRIAL FIBRILLATION ABLATION;  Surgeon: Regan Lemming, MD;  Location: [facility] INVASIVE CV LAB;  Service: Cardiovascular;  Laterality: N/A;   CARDIOVERSION N/A 01/15/2019   Procedure: CARDIOVERSION;  Surgeon: Pricilla Riffle, MD;  Location: [facility] ENDOSCOPY;  Service: Cardiovascular;  Laterality: N/A;   CARDIOVERSION N/A 07/09/2021   Procedure: CARDIOVERSION;  Surgeon: Laurey Morale, MD;  Location: [facility] ENDOSCOPY;  Service: Cardiovascular;  Laterality: N/A;   CHOLECYSTECTOMY     kidney stone removal     x2   VAGINAL HYSTERECTOMY N/A 06/14/2014   Procedure: HYSTERECTOMY VAGINAL;  Surgeon: Dara Lords, MD;  Location: [facility] ORS;  Service: Gynecology;  Laterality: N/A;    Medications: Current Outpatient Medications  Medication Sig Dispense Refill   acetaminophen (TYLENOL) 500 MG tablet Take 1,000 mg by mouth every 6 (six) hours as needed for moderate pain or  headache.     albuterol (VENTOLIN HFA) 108 (90 Base) MCG/ACT inhaler INHALE 1 TO 2 PUFFS INTO THE LUNGS EVERY 6 HOURS AS NEEDED FOR WHEEZING OR SHORTNESS OF BREATH. 8.5 g 3   ALPRAZolam (XANAX) 0.5 MG tablet TAKE 1 TO 2 TABLETS BY MOUTH 2 TIMES DAILY AS NEEDED FOR ANXIETY OR SLEEP. 30 tablet 1   apixaban (ELIQUIS) 5 MG TABS tablet TAKE 1 TABLET BY MOUTH 2 TIMES DAILY. 180 tablet 3   Ascorbic Acid (VITAMIN C) 1000 MG tablet Take 1,000 mg by mouth daily.     atropine 1 % ophthalmic solution Place 1 drop into the left eye daily.     Carboxymethylcellulose Sodium (EYE DROPS OP) Place 1 drop in the left eye every other hour- CHLORHEXIDINE, PRESERVED, 0.02 % OPHTHALMIC SOLUTION     Cholecalciferol (VITAMIN D) 50 MCG (2000 UT) tablet Take 2,000 Units by mouth daily.     dapagliflozin propanediol (FARXIGA) 10 MG TABS tablet  Take 1 tablet (10 mg total) by mouth daily. 30 tablet 5   ferrous sulfate 325 (65 FE) MG tablet Take 325 mg by mouth every other day.     fluticasone (FLONASE) 50 MCG/ACT nasal spray PLACE 1 SPRAY INTO BOTH NOSTRILS AS NEEDED FOR ALLERGIES OR RHINITIS. 16 g 6   furosemide (LASIX) 20 MG tablet Take 1 tablet (20 mg total) by mouth as needed for edema or fluid. 90 tablet 3   loratadine (CLARITIN) 10 MG tablet Take 10 mg by mouth daily.      methimazole (TAPAZOLE) 5 MG tablet Take 5 mg by mouth daily.     metoprolol succinate (TOPROL-XL) 100 MG 24 hr tablet TAKE 1 TABLET (100 MG TOTAL) BY MOUTH DAILY. TAKE WITH OR IMMEDIATELY FOLLOWING A MEAL. 90 tablet 3   moxifloxacin (VIGAMOX) 0.5 % ophthalmic solution      mupirocin ointment (BACTROBAN) 2 % Apply 1 Application topically 2 (two) times daily. 22 g 3   neomycin-polymyxin b-dexamethasone (MAXITROL) 3.5-10000-0.1 OINT Place 1 Application into the left eye at bedtime.     pantoprazole (PROTONIX) 40 MG tablet TAKE 1 TABLET (40 MG TOTAL) BY MOUTH DAILY. 90 tablet 4   potassium chloride (KLOR-CON) 10 MEQ tablet TAKE 1 TABLET BY MOUTH DAILY AS  NEEDED (TAKE WITH HCTZ). 90 tablet 3   prednisoLONE acetate (PRED FORTE) 1 % ophthalmic suspension Place 1 drop into the left eye daily.     Probiotic Product (PROBIOTIC PO) Take 1 capsule by mouth daily.     rosuvastatin (CRESTOR) 10 MG tablet TAKE 1 TABLET (10 MG TOTAL) BY MOUTH DAILY. 90 tablet 1   spironolactone (ALDACTONE) 25 MG tablet TAKE 1/2 TABLET (12.5 MG TOTAL) BY MOUTH DAILY. 15 tablet 11   trimethoprim-polymyxin b (POLYTRIM) ophthalmic solution Place 1 drop into the left eye in the morning, at noon, in the evening, and at bedtime.     valACYclovir (VALTREX) 1000 MG tablet Take 1,000 mg by mouth every morning.     vitamin E 180 MG (400 UNITS) capsule Take 400 Units by mouth daily.     ZINC CITRATE PO Take 22 mg by mouth daily.     No current facility-administered medications for this visit.    Allergies  Allergen Reactions   Erythromycin     Upset stomach   Nsaids Other (See Comments)    Has a history of bleeding ulcers, tolerates aspirin    Penicillins Hives and Shortness Of Breath    Did it involve swelling of the face/tongue/throat, SOB, or low BP? Yes Did it involve sudden or severe rash/hives, skin peeling, or any reaction on the inside of your mouth or nose? No Did you need to seek medical attention at a hospital or doctor's office? No When did it last happen?      45 years If all above answers are "NO", may proceed with cephalosporin use.     Diagnoses:  Grieving  Grief at loss of child  Grief reaction  Plan of Care: Assist Pt w/adjustment to health care status changes in the past 2-3 yrs. Pt is seeking the cause of death for her Dtr Anna Marquez. She cannot get any information from her Dtr's place of work. She is pursuing this so she can have some answers. Dtr's Sig Other has not been helpful in this process of securing some of Dtr's belongings. Fr of Sig Other has told Pt to not contact his Son Nida Boatman. This is impeding Pt's grieving process. Assist Pt in her  grieving  process so she can have some peace of mind. Target Date: 03/22/2023  Progress: 4  Frequency: Once every 2-3 wks  Modality: Claretta Fraise, LMFT

## 2023-02-19 ENCOUNTER — Ambulatory Visit: Payer: PPO | Admitting: Dermatology

## 2023-02-19 ENCOUNTER — Encounter: Payer: Self-pay | Admitting: Dermatology

## 2023-02-19 DIAGNOSIS — Z85828 Personal history of other malignant neoplasm of skin: Secondary | ICD-10-CM | POA: Diagnosis not present

## 2023-02-19 DIAGNOSIS — L929 Granulomatous disorder of the skin and subcutaneous tissue, unspecified: Secondary | ICD-10-CM | POA: Diagnosis not present

## 2023-02-19 DIAGNOSIS — T1490XD Injury, unspecified, subsequent encounter: Secondary | ICD-10-CM

## 2023-02-19 DIAGNOSIS — C4492 Squamous cell carcinoma of skin, unspecified: Secondary | ICD-10-CM

## 2023-02-19 NOTE — Patient Instructions (Signed)

## 2023-02-19 NOTE — Progress Notes (Signed)
   Follow Up Visit   Subjective  Anna Marquez is a 73 y.o. female who presents for the following: follow up from Mohs surgery x2   The patient presents for follow up from Mohs surgery for a SCC on the left lower leg, treated on 01/06/24, healing by second intention, and a SCC on the left lower leg near ankle, treated on 01/22/2023, healing by secondary intention The patient has been bandaging the wound as directed. The endorse the following concerns: still hurts but doing better.  The following portions of the chart were reviewed this encounter and updated as appropriate: medications, allergies, medical history  Review of Systems:  No other skin or systemic complaints except as noted in HPI or Assessment and Plan.  Objective  Well appearing patient in no apparent distress; mood and affect are within normal limits.  A full examination was performed including scalp, head, face and left leg. All findings within normal limits unless otherwise noted below.  Healing wound with mild erythema  Relevant physical exam findings are noted in the Assessment and Plan.       Assessment & Plan   Healing s/p Mohs for SCC, treated on 01/22/2023 left lower leg near ankle, healing by second intention - Reassured that wound is healing well - No evidence of infection - No swelling, induration, purulence, dehiscence, or tenderness out of proportion to the clinical exam, see photo above - Discussed that scars take up to 12 months to mature from the date of surgery - Recommend SPF 30+ to scar daily to prevent purple color from UV exposure during scar maturation process - Discussed that erythema and raised appearance of scar will fade over the next 4-6 months - OK to start scar massage at 4-6 weeks post-op - Can consider silicone based products for scar healing starting at 6 weeks post-op - Ok to continue ointment daily to wound under a bandage for another 2-4 weeks or until completely healed.  Healing  s/p Mohs for [facility] with proud flesh, treated on 01/06/2023 left lower leg, healing by second intention - Reassured that wound is healing well - No evidence of infection - No swelling, induration, purulence, dehiscence, or tenderness out of proportion to the clinical exam, see photo above - Discussed that scars take up to 12 months to mature from the date of surgery - Recommend SPF 30+ to scar daily to prevent purple color from UV exposure during scar maturation process - Discussed that erythema and raised appearance of scar will fade over the next 4-6 months - OK to start scar massage at 4-6 weeks post-op - Can consider silicone based products for scar healing starting at 6 weeks post-op - Silver Nitrate Applied today   Return in about 4 weeks (around 03/19/2023) for tbsc and wound check.  Dominga Ferry, Surg Tech III, am acting as scribe for Gwenith Daily, MD.   Documentation: I have reviewed the above documentation for accuracy and completeness, and I agree with the above.  Gwenith Daily, MD

## 2023-02-20 ENCOUNTER — Ambulatory Visit: Payer: PPO | Admitting: Behavioral Health

## 2023-02-22 ENCOUNTER — Other Ambulatory Visit: Payer: Self-pay | Admitting: Family Medicine

## 2023-02-22 DIAGNOSIS — F4321 Adjustment disorder with depressed mood: Secondary | ICD-10-CM

## 2023-02-24 ENCOUNTER — Ambulatory Visit: Payer: PPO | Admitting: Behavioral Health

## 2023-02-24 DIAGNOSIS — Z634 Disappearance and death of family member: Secondary | ICD-10-CM

## 2023-02-24 DIAGNOSIS — F4321 Adjustment disorder with depressed mood: Secondary | ICD-10-CM

## 2023-02-24 NOTE — Progress Notes (Signed)
   Deneise Lever, LMFT

## 2023-02-27 ENCOUNTER — Other Ambulatory Visit: Payer: Self-pay | Admitting: Family Medicine

## 2023-02-27 DIAGNOSIS — F4321 Adjustment disorder with depressed mood: Secondary | ICD-10-CM

## 2023-02-27 NOTE — Telephone Encounter (Unsigned)
Copied from CRM [id number]. Topic: Clinical - Medication Refill >> Feb 27, 2023 11:36 AM Clayton Bibles wrote: Most Recent Primary Care Visit:  Provider: Everrett Coombe  Department: [facility]  Visit Type: OFFICE VISIT  Date: 12/02/2022  Medication: ALPRAZolam Prudy Feeler) 0.5 MG tablet   Has the patient contacted their pharmacy? Yes (Agent: If no, request that the patient contact the pharmacy for the refill. If patient does not wish to contact the pharmacy document the reason why and proceed with request.) (Agent: If yes, when and what did the pharmacy advise?) Pharmacy needs order to refill  Is this the correct pharmacy for this prescription? Yes [facility] If no, delete pharmacy and type the correct one.  This is the patient's preferred pharmacy:  [facility] - [place], [place] - [address] [place] [address] Phone: [phone] Fax: [phone]    Has the prescription been filled recently? No  Is the patient out of the medication? Yes - She has been out of medication since 02/24/23  Has the patient been seen for an appointment in the last year OR does the patient have an upcoming appointment? Yes  Can we respond through MyChart? No  Agent: Please be advised that Rx refills may take up to 3 business days. We ask that you follow-up with your pharmacy.

## 2023-03-07 DIAGNOSIS — H179 Unspecified corneal scar and opacity: Secondary | ICD-10-CM | POA: Diagnosis not present

## 2023-03-07 DIAGNOSIS — X58XXXA Exposure to other specified factors, initial encounter: Secondary | ICD-10-CM | POA: Diagnosis not present

## 2023-03-07 DIAGNOSIS — S0502XA Injury of conjunctiva and corneal abrasion without foreign body, left eye, initial encounter: Secondary | ICD-10-CM | POA: Diagnosis not present

## 2023-03-07 DIAGNOSIS — H18212 Corneal edema secondary to contact lens, left eye: Secondary | ICD-10-CM | POA: Diagnosis not present

## 2023-03-12 DIAGNOSIS — H18212 Corneal edema secondary to contact lens, left eye: Secondary | ICD-10-CM | POA: Diagnosis not present

## 2023-03-12 DIAGNOSIS — H179 Unspecified corneal scar and opacity: Secondary | ICD-10-CM | POA: Diagnosis not present

## 2023-03-12 DIAGNOSIS — X58XXXA Exposure to other specified factors, initial encounter: Secondary | ICD-10-CM | POA: Diagnosis not present

## 2023-03-12 DIAGNOSIS — S0502XA Injury of conjunctiva and corneal abrasion without foreign body, left eye, initial encounter: Secondary | ICD-10-CM | POA: Diagnosis not present

## 2023-03-14 DIAGNOSIS — S0502XA Injury of conjunctiva and corneal abrasion without foreign body, left eye, initial encounter: Secondary | ICD-10-CM | POA: Diagnosis not present

## 2023-03-14 DIAGNOSIS — H16012 Central corneal ulcer, left eye: Secondary | ICD-10-CM | POA: Diagnosis not present

## 2023-03-14 DIAGNOSIS — H179 Unspecified corneal scar and opacity: Secondary | ICD-10-CM | POA: Diagnosis not present

## 2023-03-14 DIAGNOSIS — H18212 Corneal edema secondary to contact lens, left eye: Secondary | ICD-10-CM | POA: Diagnosis not present

## 2023-03-14 DIAGNOSIS — X58XXXA Exposure to other specified factors, initial encounter: Secondary | ICD-10-CM | POA: Diagnosis not present

## 2023-03-17 ENCOUNTER — Ambulatory Visit: Payer: PPO | Admitting: Behavioral Health

## 2023-03-17 DIAGNOSIS — F4323 Adjustment disorder with mixed anxiety and depressed mood: Secondary | ICD-10-CM | POA: Diagnosis not present

## 2023-03-17 DIAGNOSIS — F4321 Adjustment disorder with depressed mood: Secondary | ICD-10-CM

## 2023-03-17 DIAGNOSIS — Z634 Disappearance and death of family member: Secondary | ICD-10-CM | POA: Diagnosis not present

## 2023-03-17 NOTE — Progress Notes (Signed)
 [facility] Counselor/Therapist Progress Note  Patient ID: Anna Marquez, MRN: [id number],    Date: 02/24/2023  Time Spent: 55 min In Person @ [facility] Office Time In: 11:00am Time Out:  11:55am  Treatment Type: Individual Therapy  Reported Symptoms: Cont'd grief & sadness over loss of Dtr to unk death cause, her BF's Family & their Tx of Pt @ the Funeral-disrespectful & rude.  Mental Status Exam: Appearance:  Casual and Neat     Behavior: Appropriate and Sharing  Motor: Normal  Speech/Language:  Clear and Coherent  Affect: Appropriate  Mood: normal & sad  Thought process: normal  Thought content:   WNL  Sensory/Perceptual disturbances:   WNL  Orientation: oriented to person, place, time/date, and situation  Attention: Good  Concentration: Good  Memory: WNL  Fund of knowledge:  Good  Insight:   Good  Judgment:  Good  Impulse Control: Good   Risk Assessment: Danger to Self:  No Self-injurious Behavior: No Danger to Others: No Duty to Warn:no Physical Aggression / Violence:No  Access to Firearms a concern: No  Gang Involvement:No   Subjective: Pt describes the [employer] for her Dtr Anna Marquez, making addt'l notice of her character &  loyalty to Pt. Dtr's BF sat w/Pt & her Family, but his Parents did not. The Parents even walked out early & the Mother of the BF used FaceBook to send negativity about Pt & her Family. This was disturbing. Pt realizes her 74yo Dtr did not always make the best decisions re: men, but she did love hard & true.  BF's Family has lost their other Son & are still grieving this Son. Pt is struggling to understand the variance in their Tx of her & Family prior to, during, & after the Erasmo Score. It is difficult to focus on her own grief w/all these factors in the situation. Her Dtr's belongings were treated badly by the BF's Family & they disparaged Pt's Dtr by calling her a "freeloader, cheap, & irresponsible".   Pt recounts her Dtr's  wish to change her life in the past few yrs by attending [facility] & dedicating herself to it. Pt is esp'ly hurt that the memory of her Dtr Anna Marquez has been tainted by so many outside forces. She knew her Dtr well & wants her memory to remain real & true.   Interventions: Family Systems  Diagnosis:Grief reaction  Grief at loss of child  Plan: Pt will cont to use her Notebook to record her feelings btwn sessions. She will treat her grief gently as it is helping her cope to remember her Dtr & all the good she embodied. She is trying to forgive this Family her Dtr was a part of & not judge their actions too harshly. She is focusing on her own grief & trying to heal her hurt.  Target Date: 03/22/2023  Progress: 5  Frequency: Once every 2-3 wks  Modality: Claretta Fraise, LMFT

## 2023-03-17 NOTE — Progress Notes (Signed)
   Deneise Lever, LMFT

## 2023-03-19 ENCOUNTER — Ambulatory Visit: Payer: PPO | Admitting: Dermatology

## 2023-03-19 ENCOUNTER — Encounter: Payer: Self-pay | Admitting: Pulmonary Disease

## 2023-03-19 ENCOUNTER — Encounter: Payer: Self-pay | Admitting: Dermatology

## 2023-03-19 VITALS — BP 124/74

## 2023-03-19 DIAGNOSIS — Z85828 Personal history of other malignant neoplasm of skin: Secondary | ICD-10-CM | POA: Diagnosis not present

## 2023-03-19 DIAGNOSIS — T1490XD Injury, unspecified, subsequent encounter: Secondary | ICD-10-CM

## 2023-03-19 DIAGNOSIS — Z48817 Encounter for surgical aftercare following surgery on the skin and subcutaneous tissue: Secondary | ICD-10-CM | POA: Diagnosis not present

## 2023-03-19 DIAGNOSIS — L905 Scar conditions and fibrosis of skin: Secondary | ICD-10-CM

## 2023-03-19 DIAGNOSIS — C4492 Squamous cell carcinoma of skin, unspecified: Secondary | ICD-10-CM

## 2023-03-19 DIAGNOSIS — L929 Granulomatous disorder of the skin and subcutaneous tissue, unspecified: Secondary | ICD-10-CM | POA: Diagnosis not present

## 2023-03-19 DIAGNOSIS — L820 Inflamed seborrheic keratosis: Secondary | ICD-10-CM

## 2023-03-19 NOTE — Patient Instructions (Addendum)
 Cryotherapy Aftercare  Wash gently with soap and water everyday.   Apply Vaseline and Band-Aid daily until healed.          Post-Operative Scar Care: Education and Recommendations  Following your procedure, it's important to care for your scar to promote optimal healing and minimize its appearance. Proper post-operative care can help ensure that the scar heals well, and with time, it may become less noticeable. Below are key recommendations for scar care, including scar massage and the use of silicone scar gels or sheets.  1. General Scar Care Tips: -  Keep the wound clean and dry: Follow your healthcare provider's instructions for wound care, including cleaning the site and changing dressings as needed. -  Avoid sun exposure: Direct sunlight can darken scars and make them more noticeable. Once your wound has healed, apply sunscreen (SPF 30 or higher) to protect the scar from UV rays.  2. Scar Massage: - Start after healing: Wait until the scar has fully healed, with no scabs or open areas (usually 4-6 weeks after surgery). Your healthcare provider will give you specific guidance on when to begin. - Technique: Gently massage the scar in a circular motion for 5-10 minutes, 2-3 times per day. This helps to soften the tissue, reduce swelling, and improve the overall appearance of the scar. - Pressure: Apply gentle, firm pressure during the massage to break down the dense tissue that may form during healing. This helps to prevent the formation of keloids or hypertrophic scars. - Use lotion or ointment: Consider using a mild, fragrance-free lotion or vitamin E ointment to help lubricate the area during massage.  3. Silicone Scar Gels or Sheets: - When to start: Once your wound has healed completely, typically around 4-6 weeks, you can begin using silicone-based scar gels or sheets. These have been shown to improve scar appearance by hydrating the tissue and reducing inflammation. - How to use  silicone gels: Apply a thin layer of the gel to the scar and allow it to dry before covering with clothing. You can use the gel multiple times a day, depending on your provider's recommendation. - How to use silicone sheets: Cut the sheet to fit the size of your scar, and apply it directly to the healed scar. Wear it for 12-24 hours a day, and replace the sheet every few days as directed. - Benefits: Silicone helps reduce redness, flatten the scar, and improve its texture. Continued use over several months can lead to significant improvement in the appearance of the scar.  4. What to Expect: - Healing process: Scars generally take time to mature. The first few months may show redness or swelling, but this usually improves as healing progresses. - Long-term care: Scarring is a natural part of the healing process. While you cannot completely eliminate a scar, proper care can significantly improve its appearance over time. - Patience: It can take up to a year for a scar to fully mature, so it's important to be consistent with scar care and follow-up appointments with your provider.  5. When to Contact Your Healthcare Provider: - If you notice signs of infection (increased redness, warmth, drainage, or pain). - If your scar becomes unusually raised, itchy, or changes in color significantly. - If you have concerns about the appearance of your scar or experience unusual symptoms. - By following these guidelines, you can support your body's natural healing process and help ensure the best possible outcome for your scar. If you have any questions or concerns,  please don't hesitate to contact our office.     Important Information  Due to recent changes in healthcare laws, you may see results of your pathology and/or laboratory studies on MyChart before the doctors have had a chance to review them. We understand that in some cases there may be results that are confusing or concerning to you. Please  understand that not all results are received at the same time and often the doctors may need to interpret multiple results in order to provide you with the best plan of care or course of treatment. Therefore, we ask that you please give [place] 2 business days to thoroughly review all your results before contacting the office for clarification. Should we see a critical lab result, you will be contacted sooner.   If You Need Anything After Your Visit  If you have any questions or concerns for your doctor, please call our main line at [phone] If no one answers, please leave a voicemail as directed and we will return your call as soon as possible. Messages left after 4 pm will be answered the following business day.   You may also send [place] a message via MyChart. We typically respond to MyChart messages within 1-2 business days.  For prescription refills, please ask your pharmacy to contact our office. Our fax number is [phone].  If you have an urgent issue when the clinic is closed that cannot wait until the next business day, you can page your doctor at the number below.    Please note that while we do our best to be available for urgent issues outside of office hours, we are not available 24/7.   If you have an urgent issue and are unable to reach [place], you may choose to seek medical care at your doctor's office, retail clinic, urgent care center, or emergency room.  If you have a medical emergency, please immediately call 911 or go to the emergency department. In the event of inclement weather, please call our main line at [phone] for an update on the status of any delays or closures.  Dermatology Medication Tips: Please keep the boxes that topical medications come in in order to help keep track of the instructions about where and how to use these. Pharmacies typically print the medication instructions only on the boxes and not directly on the medication tubes.   If your medication is  too expensive, please contact our office at [phone] or send [place] a message through MyChart.   We are unable to tell what your co-pay for medications will be in advance as this is different depending on your insurance coverage. However, we may be able to find a substitute medication at lower cost or fill out paperwork to get insurance to cover a needed medication.   If a prior authorization is required to get your medication covered by your insurance company, please allow [place] 1-2 business days to complete this process.  Drug prices often vary depending on where the prescription is filled and some pharmacies may offer cheaper prices.  The website www.goodrx.com contains coupons for medications through different pharmacies. The prices here do not account for what the cost may be with help from insurance (it may be cheaper with your insurance), but the website can give you the Vien if you did not use any insurance.  - You can print the associated coupon and take it with your prescription to the pharmacy.  - You may also stop by our office during regular  business hours and pick up a GoodRx coupon card.  - If you need your prescription sent electronically to a different pharmacy, notify our office through [facility] or by phone at [phone]

## 2023-03-19 NOTE — Progress Notes (Signed)
 Follow-Up Visit   Subjective  Anna Marquez is a 73 y.o. female who presents for the following: History of SCC of left lower leg and left lower leg near ankle s/p Mohs   The following portions of the chart were reviewed this encounter and updated as appropriate: medications, allergies, medical history  Review of Systems:  No other skin or systemic complaints except as noted in HPI or Assessment and Plan.  Objective  Well appearing patient in no apparent distress; mood and affect are within normal limits.  A focused examination was performed of the following areas: Left lower leg Right Hip  Relevant exam findings are noted in the Assessment and Plan.        Right lower abdomen Erythematous stuck-on, waxy papule or plaque  Assessment & Plan   Healing s/p Mohs for SCC, treated on 01/22/2023 left lower leg near ankle, healing by second intention - Reassured that wound is healing well. Debrided today followed by mupirocin and bandage. - No evidence of infection - No swelling, induration, purulence, dehiscence, or tenderness out of proportion to the clinical exam, see photo above - Discussed that scars take up to 12 months to mature from the date of surgery - Recommend SPF 30+ to scar daily to prevent purple color from UV exposure during scar maturation process - Discussed that erythema and raised appearance of scar will fade over the next 4-6 months - OK to start scar massage at 4-6 weeks post-op - Can consider silicone based products for scar healing starting at 6 weeks post-op - Ok to continue mupirocin ointment daily to wound under a bandage for another 2-4 weeks or until completely healed.   Healing s/p Mohs for SCC with proud flesh, treated on 01/06/2023 left lower leg, healing by second intention - Reassured that wound is healed well. - No evidence of infection - No swelling, induration, purulence, dehiscence, or tenderness out of proportion to the clinical exam, see  photo above - Discussed that scars take up to 12 months to mature from the date of surgery - Recommend SPF 30+ to scar daily to prevent purple color from UV exposure during scar maturation process - Discussed that erythema and raised appearance of scar will fade over the next 4-6 months - OK to start scar massage and  silicone based products for scar improvement.  HISTORY OF SQUAMOUS CELL CARCINOMA OF THE SKIN - No evidence of recurrence today - No lymphadenopathy - Recommend regular full body skin exams - Recommend daily broad spectrum sunscreen SPF 30+ to sun-exposed areas, reapply every 2 hours as needed.  - Call if any new or changing lesions are noted between office visits    INFLAMED SEBORRHEIC KERATOSIS Right lower abdomen Symptomatic, irritating, patient would like treated.  Benign-appearing.  Call clinic for new or changing lesions.   Destruction of lesion - Right lower abdomen Complexity: simple   Destruction method: cryotherapy   Informed consent: discussed and consent obtained   Timeout:  patient name, date of birth, surgical site, and procedure verified Lesion destroyed using liquid nitrogen: Yes   Region frozen until ice ball extended beyond lesion: Yes   Outcome: patient tolerated procedure well with no complications   Post-procedure details: wound care instructions given   HEALING WOUND   SCAR   SQUAMOUS CELL CARCINOMA OF SKIN    Return for 2-4 weeks follow up history of SCC left lower leg near ankle, ISK.  I, Joanie Coddington, CMA, am acting as Neurosurgeon for [employer]  Carroll Sage, MD .   Documentation: I have reviewed the above documentation for accuracy and completeness, and I agree with the above.  Gwenith Daily, MD

## 2023-03-21 DIAGNOSIS — H179 Unspecified corneal scar and opacity: Secondary | ICD-10-CM | POA: Diagnosis not present

## 2023-03-21 DIAGNOSIS — B6013 Keratoconjunctivitis due to Acanthamoeba: Secondary | ICD-10-CM | POA: Diagnosis not present

## 2023-03-21 DIAGNOSIS — H16012 Central corneal ulcer, left eye: Secondary | ICD-10-CM | POA: Diagnosis not present

## 2023-03-21 DIAGNOSIS — H18212 Corneal edema secondary to contact lens, left eye: Secondary | ICD-10-CM | POA: Diagnosis not present

## 2023-03-24 ENCOUNTER — Ambulatory Visit (INDEPENDENT_AMBULATORY_CARE_PROVIDER_SITE_OTHER): Payer: PPO | Admitting: Family Medicine

## 2023-03-24 ENCOUNTER — Encounter: Payer: Self-pay | Admitting: Family Medicine

## 2023-03-24 VITALS — BP 125/65 | HR 78 | Ht 64.0 in | Wt 175.0 lb

## 2023-03-24 DIAGNOSIS — F4321 Adjustment disorder with depressed mood: Secondary | ICD-10-CM

## 2023-03-24 DIAGNOSIS — Z634 Disappearance and death of family member: Secondary | ICD-10-CM

## 2023-03-24 DIAGNOSIS — I4891 Unspecified atrial fibrillation: Secondary | ICD-10-CM | POA: Diagnosis not present

## 2023-03-24 DIAGNOSIS — I10 Essential (primary) hypertension: Secondary | ICD-10-CM | POA: Diagnosis not present

## 2023-03-24 DIAGNOSIS — E0590 Thyrotoxicosis, unspecified without thyrotoxic crisis or storm: Secondary | ICD-10-CM

## 2023-03-24 MED ORDER — ALPRAZOLAM 0.5 MG PO TABS: ORAL_TABLET | ORAL | 2 refills | Status: DC

## 2023-03-24 NOTE — Progress Notes (Signed)
 Anna Marquez - 73 y.o. female MRN [id number]  Date of birth: 1950/09/03  Subjective Chief Complaint  Patient presents with   Hypertension    HPI Anna Marquez is a 73 y.o. female here today for follow-up visit.  She reports she is doing okay.  Still continues to deal with grief from the loss of her daughter.  Prescribed trazodone previously to help with sleep as well as alprazolam as needed.  She has not tried trazodone.  She is seeing a therapist regularly.  She does feel like this is helpful.  Unfortunately she has redeveloped acanthamoeba infection of the eye.  She is followed by ophthalmology at [facility] for treatment of this.  Continues to see cardiology for history of A-fib.  Blood pressure remains well-controlled with current medications.  She does have occasional palpitations but does not think she has been in A-fib.  She denies chest pain dyspnea or increased fatigue.  ROS:  A comprehensive ROS was completed and negative except as noted per HPI   Allergies  Allergen Reactions   Erythromycin     Upset stomach   Nsaids Other (See Comments)    Has a history of bleeding ulcers, tolerates aspirin    Penicillins Hives and Shortness Of Breath    Did it involve swelling of the face/tongue/throat, SOB, or low BP? Yes Did it involve sudden or severe rash/hives, skin peeling, or any reaction on the inside of your mouth or nose? No Did you need to seek medical attention at a hospital or doctor's office? No When did it last happen?      45 years If all above answers are "NO", may proceed with cephalosporin use.     Past Medical History:  Diagnosis Date   Adenomatous colon polyp 2003   Cholelithiasis    COVID    Diverticulosis of colon    Gastric polyps    GERD (gastroesophageal reflux disease)    Helicobacter pylori (H. pylori)    Hiatal hernia    Hypertension    Mitral valve prolapse    Nephrolithiasis    Osteoarthritis    Osteopenia 03/2017   T score -1.5 FRAX 8.9% /  0.9%.  Stable from prior DEXA 2016   Persistent atrial fibrillation (HCC)    Squamous cell carcinoma of skin 12/12/2022   Left lower leg anterior (KA type) - Mohs 01/06/2023   Squamous cell carcinoma of skin 01/06/2023   Left lower leg near ankle (KA type) - needs Mohs   TGA (transient global amnesia)     Past Surgical History:  Procedure Laterality Date   ANTERIOR AND POSTERIOR REPAIR N/A 06/14/2014   Procedure: ANTERIOR (CYSTOCELE) AND POSTERIOR REPAIR (RECTOCELE);  Surgeon: Dara Lords, MD;  Location: [facility];  Service: Gynecology;  Laterality: N/A;   ATRIAL FIBRILLATION ABLATION N/A 02/11/2019   Procedure: ATRIAL FIBRILLATION ABLATION;  Surgeon: Hillis Range, MD;  Location: [facility] INVASIVE CV LAB;  Service: Cardiovascular;  Laterality: N/A;   ATRIAL FIBRILLATION ABLATION N/A 11/23/2021   Procedure: ATRIAL FIBRILLATION ABLATION;  Surgeon: Regan Lemming, MD;  Location: [facility] INVASIVE CV LAB;  Service: Cardiovascular;  Laterality: N/A;   CARDIOVERSION N/A 01/15/2019   Procedure: CARDIOVERSION;  Surgeon: Pricilla Riffle, MD;  Location: [facility] ENDOSCOPY;  Service: Cardiovascular;  Laterality: N/A;   CARDIOVERSION N/A 07/09/2021   Procedure: CARDIOVERSION;  Surgeon: Laurey Morale, MD;  Location: [facility] ENDOSCOPY;  Service: Cardiovascular;  Laterality: N/A;   CHOLECYSTECTOMY     kidney stone removal  x2   VAGINAL HYSTERECTOMY N/A 06/14/2014   Procedure: HYSTERECTOMY VAGINAL;  Surgeon: Dara Lords, MD;  Location: [facility];  Service: Gynecology;  Laterality: N/A;    Social History   Socioeconomic History   Marital status: Divorced    Spouse name: Not on file   Number of children: 2   Years of education: 12th grade   Highest education level: 12th grade  Occupational History   Occupation: Retired,  Tobacco Use   Smoking status: Never   Smokeless tobacco: Never   Tobacco comments:    Never smoke 07/16/21  Vaping Use   Vaping status: Never Used  Substance and Sexual Activity    Alcohol use: Never    Alcohol/week: 0.0 standard drinks of alcohol   Drug use: No   Sexual activity: Yes    Birth control/protection: Post-menopausal    Comment: 1st intercourse 73 yo-Fewer than 5 partners  Other Topics Concern   Not on file  Social History Narrative   Lives along with her dog. She enjoys croteching, crafts, making wreaths, reading and gardening in her free time.    Social Drivers of Corporate investment banker Strain: Low Risk  (03/23/2023)   Overall Financial Resource Strain (CARDIA)    Difficulty of Paying Living Expenses: Not very hard  Food Insecurity: No Food Insecurity (03/23/2023)   Hunger Vital Sign    Worried About Running Out of Food in the Last Year: Never true    Ran Out of Food in the Last Year: Never true  Transportation Needs: No Transportation Needs (03/23/2023)   PRAPARE - Administrator, Civil Service (Medical): No    Lack of Transportation (Non-Medical): No  Physical Activity: Insufficiently Active (03/23/2023)   Exercise Vital Sign    Days of Exercise per Week: 3 days    Minutes of Exercise per Session: 30 min  Stress: Stress Concern Present (03/23/2023)   [employer] of Occupational Health - Occupational Stress Questionnaire    Feeling of Stress : Rather much  Social Connections: Moderately Integrated (03/23/2023)   Social Connection and Isolation Panel [NHANES]    Frequency of Communication with Friends and Family: More than three times a week    Frequency of Social Gatherings with Friends and Family: Three times a week    Attends Religious Services: More than 4 times per year    Active Member of Clubs or Organizations: Yes    Attends Engineer, structural: More than 4 times per year    Marital Status: Divorced    Family History  Problem Relation Age of Onset   Cancer Father        prostate   Breast cancer Paternal Aunt 72   Diabetes Maternal Grandmother    Heart disease Maternal Grandmother    Heart disease  Maternal Grandfather    Melanoma Paternal Aunt    Neuropathy Mother    Congestive Heart Failure Mother    Thyroid disease Daughter     Health Maintenance  Topic Date Due   Zoster Vaccines- Shingrix (1 of 2) 02/02/1968   Medicare Annual Wellness (AWV)  03/27/2022   MAMMOGRAM  07/27/2022   Colonoscopy  03/13/2023   COVID-19 Vaccine (3 - Pfizer risk series) 04/05/2023 (Originally 04/21/2019)   DTaP/Tdap/Td (3 - Tdap) 10/29/2027   Pneumonia Vaccine 56+ Years old  Completed   INFLUENZA VACCINE  Completed   DEXA SCAN  Completed   Hepatitis C Screening  Completed   HPV VACCINES  Aged Out     -----------------------------------------------------------------------------------------------------------------------------------------------------------------------------------------------------------------  Physical Exam BP 125/65 (BP Location: Left Arm, Patient Position: Sitting, Cuff Size: Large)   Pulse 78   Ht 5\' 4"  (1.626 m)   Wt 175 lb (79.4 kg)   SpO2 94%   BMI 30.04 kg/m   Physical Exam Constitutional:      Appearance: Normal appearance.  Eyes:     General: No scleral icterus. Cardiovascular:     Rate and Rhythm: Normal rate and regular rhythm.  Pulmonary:     Effort: Pulmonary effort is normal.     Breath sounds: Normal breath sounds.  Neurological:     Mental Status: She is alert.  Psychiatric:        Mood and Affect: Mood normal.        Behavior: Behavior normal.     ------------------------------------------------------------------------------------------------------------------------------------------------------------------------------------------------------------------- Assessment and Plan  Essential hypertension Blood pressure well-controlled at this time.  She will continue current medications for management of her hypertension and cardiac disorders.  Atrial fibrillation with RVR (HCC) She had recent ablation for A-fib.  She is off amiodarone but remains on  metoprolol.  She has remained in normal sinus rhythm.  Denies symptoms at this time.  She will remain on Eliquis for anticoagulation.  Hyperthyroidism Methimazole dose reduced to 5 mg daily by endocrinology.  She will continue management per their recommendations.  Grief at loss of child She is seeing counselor for grief counseling.  Continue alprazolam as needed for now.  Prescription renewed.   Meds ordered this encounter  Medications   ALPRAZolam (XANAX) 0.5 MG tablet    Sig: TAKE 1 TO 2 TABLETS BY MOUTH 2 TIMES DAILY AS NEEDED FOR ANXIETY OR SLEEP.    Dispense:  45 tablet    Refill:  2    Return in about 4 months (around 07/24/2023) for Hypertension, Mood/BH.    This visit occurred during the SARS-CoV-2 public health emergency.  Safety protocols were in place, including screening questions prior to the visit, additional usage of staff PPE, and extensive cleaning of exam room while observing appropriate contact time as indicated for disinfecting solutions.

## 2023-03-24 NOTE — Assessment & Plan Note (Signed)
 She had recent ablation for A-fib.  She is off amiodarone but remains on metoprolol.  She has remained in normal sinus rhythm.  Denies symptoms at this time.  She will remain on Eliquis for anticoagulation.

## 2023-03-24 NOTE — Assessment & Plan Note (Signed)
Blood pressure well-controlled at this time.  She will continue current medications for management of her hypertension and cardiac disorders.

## 2023-03-24 NOTE — Assessment & Plan Note (Signed)
 She is seeing counselor for grief counseling.  Continue alprazolam as needed for now.  Prescription renewed.

## 2023-03-24 NOTE — Assessment & Plan Note (Signed)
Methimazole dose reduced to 5 mg daily by endocrinology.  She will continue management per their recommendations.

## 2023-03-26 ENCOUNTER — Encounter (HOSPITAL_COMMUNITY): Payer: Self-pay

## 2023-03-26 ENCOUNTER — Ambulatory Visit (HOSPITAL_COMMUNITY)
Admission: RE | Admit: 2023-03-26 | Discharge: 2023-03-26 | Disposition: A | Discharge status: Home / Self Care | Payer: PPO | Source: Ambulatory Visit | Attending: Family Medicine | Admitting: Family Medicine

## 2023-03-26 VITALS — BP 122/82 | HR 86 | Wt 177.4 lb

## 2023-03-26 DIAGNOSIS — H16012 Central corneal ulcer, left eye: Secondary | ICD-10-CM | POA: Diagnosis not present

## 2023-03-26 DIAGNOSIS — H18212 Corneal edema secondary to contact lens, left eye: Secondary | ICD-10-CM | POA: Diagnosis not present

## 2023-03-26 DIAGNOSIS — Z79899 Other long term (current) drug therapy: Secondary | ICD-10-CM | POA: Insufficient documentation

## 2023-03-26 DIAGNOSIS — Z7901 Long term (current) use of anticoagulants: Secondary | ICD-10-CM | POA: Diagnosis not present

## 2023-03-26 DIAGNOSIS — E0590 Thyrotoxicosis, unspecified without thyrotoxic crisis or storm: Secondary | ICD-10-CM | POA: Diagnosis not present

## 2023-03-26 DIAGNOSIS — I2510 Atherosclerotic heart disease of native coronary artery without angina pectoris: Secondary | ICD-10-CM | POA: Insufficient documentation

## 2023-03-26 DIAGNOSIS — H179 Unspecified corneal scar and opacity: Secondary | ICD-10-CM | POA: Diagnosis not present

## 2023-03-26 DIAGNOSIS — I4819 Other persistent atrial fibrillation: Secondary | ICD-10-CM | POA: Diagnosis not present

## 2023-03-26 DIAGNOSIS — I480 Paroxysmal atrial fibrillation: Secondary | ICD-10-CM | POA: Diagnosis present

## 2023-03-26 DIAGNOSIS — I5032 Chronic diastolic (congestive) heart failure: Secondary | ICD-10-CM | POA: Diagnosis not present

## 2023-03-26 DIAGNOSIS — E0520 Thyrotoxicosis with toxic multinodular goiter without thyrotoxic crisis or storm: Secondary | ICD-10-CM | POA: Diagnosis not present

## 2023-03-26 DIAGNOSIS — B6013 Keratoconjunctivitis due to Acanthamoeba: Secondary | ICD-10-CM | POA: Diagnosis not present

## 2023-03-26 LAB — BASIC METABOLIC PANEL
Anion gap: 12 (ref 5–15)
BUN: 7 mg/dL — ABNORMAL LOW (ref 8–23)
CO2: 25 mmol/L (ref 22–32)
Calcium: 9.6 mg/dL (ref 8.9–10.3)
Chloride: 104 mmol/L (ref 98–111)
Creatinine, Ser: 1.05 mg/dL — ABNORMAL HIGH (ref 0.44–1.00)
GFR, Estimated: 56 mL/min — ABNORMAL LOW (ref >60)
Glucose, Bld: 102 mg/dL — ABNORMAL HIGH (ref 70–99)
Potassium: 3.4 mmol/L — ABNORMAL LOW (ref 3.5–5.1)
Sodium: 141 mmol/L (ref 135–145)

## 2023-03-26 LAB — CBC
HCT: 36.3 % (ref 36.0–46.0)
Hemoglobin: 12.1 g/dL (ref 12.0–15.0)
MCH: 29.4 pg (ref 26.0–34.0)
MCHC: 33.3 g/dL (ref 30.0–36.0)
MCV: 88.1 fL (ref 80.0–100.0)
Platelets: 175 10*3/uL (ref 150–400)
RBC: 4.12 MIL/uL (ref 3.87–5.11)
RDW: 13.6 % (ref 11.5–15.5)
WBC: 5.8 10*3/uL (ref 4.0–10.5)
nRBC: 0 % (ref 0.0–0.2)

## 2023-03-26 NOTE — Progress Notes (Signed)
 ADVANCED HF CLINIC NOTE  Primary Care: Everrett Coombe, DO HF Cardiologist: Dr. Shirlee Latch  HPI: Anna Marquez is a 73 y.o.with history of diastolic CHF, paroxysmal atrial fibrillation, and hyperthyroidism.  She was was admitted with atrial fibrillation/RVR and acute on chronic diastolic CHF. Patient was on Tikosyn several years ago for AF then had ablation in 2/21 by Dr. Johney Frame.  Tikosyn was later stopped.  Recently, she has re-developed episodes of atrial fibrillation.  She also, of noted, has been found to have hyperthyroidism likely related to multinodular goiter.  She was admitted in 5/23 with acute respiratory failure in setting of atrial fibrillation and CHF, there was some question of PNA.  She spontaneously converted to NSR.  At office visit subsequently on 06/19/21, she remained in NSR.     She was admitted  07/03/21 with atrial fibrillation with RVR and developed progressive respiratory distress. Amiodarone gtt was started and she was given Lasix, but ultimately required intubation. She developed hypotension and required NE + vasopressin with elevated lactate, and AKI. TEE showed normal LV size with EF 60-65%, mild RV dysfunction, severe LAE with moderate-severe central MR (likely atrial functional MR), no LA appendage thrombus. DCCV was attempted x 2, both times she went back into NSR but promptly reverted to AF. Eventual successful DCCV to NSR on 07/09/21. Drips weaned off, and started on oral amiodarone taper. GDMT started and she was discharged home, weight 162 lbs.  Follow up in AF clinic 07/16/21, HR 123 in Afib. Felt AF paroxysmal and needed more time for amiodarone load. Toprol started.   In 11/23, she had atrial fibrillation ablation.  Amiodarone was stopped in 2/24.   Echo in 3/24 showed EF 55-60%, normal RV, mild MR.    Today she returns for HF follow up. Overall feeling fair. Her daughter passed 11/2022 and she has been struggling with grief. She is not SOB with ADLs or walking up  steps. Feels rare palpitations. Denies abnormal bleeding, CP, dizziness, edema, or PND/Orthopnea. Appetite ok. No fever or chills. Weight at home 170 pounds. Taking all medications. Uses Lasix 1-2/month. BP at home ~ 110/74. Sees ophthalmology at [facility] for chronic Acanthamoeba keratitis, may need cornea transplant down the road.  ECG (personally reviewed): NSR 85 bpm  Labs (7/23): K 4.8, creatinine 1.47, normal LFTs Labs (8/23): K 3.7, creatinine 1.05 Labs (10/23): K 3.7, creatinine 1.11 Labs (12/23): LDL 94, TGs 341, TSH 15, BNP 47, K 4.3, creatinine 1.22 Labs (3/24): LDL 44 Labs (8/24): hgb 13, K 3.6, creatinine 1.3  PMH: 1. Cholelithiasis 2. Diverticulosis 3. H/o COVID-19 4. Hyperthyroidism: likely related to amiodarone.  5. Atrial fibrillation: Paroxysmal. - Atrial fibrillation ablation 2/21.  - Atrial fibrillation ablation in 11/23.  6. Chronic diastolic CHF:  TEE (6/23) with normal LV size with EF 60-65%, mild RV dysfunction, severe LAE with moderate-severe central MR (likely atrial functional MR), no LA appendage thrombus. - Echo (3/24): EF 55-60%, normal RV, mild MR.  7. CAD: CTA for pulmonary veins showed calcium score 134, 72nd percentile.  8. Acanthamoeba keratitis   Current Outpatient Medications  Medication Sig Dispense Refill   acetaminophen (TYLENOL) 500 MG tablet Take 1,000 mg by mouth every 6 (six) hours as needed for moderate pain or headache.     albuterol (VENTOLIN HFA) 108 (90 Base) MCG/ACT inhaler INHALE 1 TO 2 PUFFS INTO THE LUNGS EVERY 6 HOURS AS NEEDED FOR WHEEZING OR SHORTNESS OF BREATH. 8.5 g 3   ALPRAZolam (XANAX) 0.5 MG tablet TAKE 1 TO  2 TABLETS BY MOUTH 2 TIMES DAILY AS NEEDED FOR ANXIETY OR SLEEP. 45 tablet 2   apixaban (ELIQUIS) 5 MG TABS tablet TAKE 1 TABLET BY MOUTH 2 TIMES DAILY. 180 tablet 3   Ascorbic Acid (VITAMIN C) 1000 MG tablet Take 1,000 mg by mouth daily.     atropine 1 % ophthalmic solution Place 1 drop into the left eye daily.      Carboxymethylcellulose Sodium (EYE DROPS OP) Place 1 drop in the left eye every other hour- CHLORHEXIDINE, PRESERVED, 0.02 % OPHTHALMIC SOLUTION     Cholecalciferol (VITAMIN D) 50 MCG (2000 UT) tablet Take 2,000 Units by mouth daily.     dapagliflozin propanediol (FARXIGA) 10 MG TABS tablet Take 1 tablet (10 mg total) by mouth daily. 30 tablet 5   ferrous sulfate 325 (65 FE) MG tablet Take 325 mg by mouth every other day.     fluticasone (FLONASE) 50 MCG/ACT nasal spray PLACE 1 SPRAY INTO BOTH NOSTRILS AS NEEDED FOR ALLERGIES OR RHINITIS. 16 g 6   furosemide (LASIX) 20 MG tablet Take 1 tablet (20 mg total) by mouth as needed for edema or fluid. 90 tablet 3   loratadine (CLARITIN) 10 MG tablet Take 10 mg by mouth daily.      methimazole (TAPAZOLE) 5 MG tablet Take 5 mg by mouth daily.     metoprolol succinate (TOPROL-XL) 100 MG 24 hr tablet TAKE 1 TABLET (100 MG TOTAL) BY MOUTH DAILY. TAKE WITH OR IMMEDIATELY FOLLOWING A MEAL. 90 tablet 3   mupirocin ointment (BACTROBAN) 2 % Apply 1 Application topically 2 (two) times daily. 22 g 3   pantoprazole (PROTONIX) 40 MG tablet TAKE 1 TABLET (40 MG TOTAL) BY MOUTH DAILY. 90 tablet 4   potassium chloride (KLOR-CON) 10 MEQ tablet TAKE 1 TABLET BY MOUTH DAILY AS NEEDED (TAKE WITH HCTZ). 90 tablet 3   prednisoLONE acetate (PRED FORTE) 1 % ophthalmic suspension Place 1 drop into the left eye in the morning and at bedtime.     Probiotic Product (PROBIOTIC PO) Take 1 capsule by mouth daily.     rosuvastatin (CRESTOR) 10 MG tablet TAKE 1 TABLET (10 MG TOTAL) BY MOUTH DAILY. 90 tablet 1   spironolactone (ALDACTONE) 25 MG tablet TAKE 1/2 TABLET (12.5 MG TOTAL) BY MOUTH DAILY. 15 tablet 11   trimethoprim-polymyxin b (POLYTRIM) ophthalmic solution Place 1 drop into the left eye. Patient does 6 times  a day.     valACYclovir (VALTREX) 1000 MG tablet Take 1,000 mg by mouth every morning.     vitamin E 180 MG (400 UNITS) capsule Take 400 Units by mouth daily.     ZINC  CITRATE PO Take 22 mg by mouth daily.     No current facility-administered medications for this encounter.   Allergies  Allergen Reactions   Erythromycin     Upset stomach   Nsaids Other (See Comments)    Has a history of bleeding ulcers, tolerates aspirin    Penicillins Hives and Shortness Of Breath    Did it involve swelling of the face/tongue/throat, SOB, or low BP? Yes Did it involve sudden or severe rash/hives, skin peeling, or any reaction on the inside of your mouth or nose? No Did you need to seek medical attention at a hospital or doctor's office? No When did it last happen?      45 years If all above answers are "NO", may proceed with cephalosporin use.    Social History   Socioeconomic History  Marital status: Divorced    Spouse name: Not on file   Number of children: 2   Years of education: 12th grade   Highest education level: 12th grade  Occupational History   Occupation: Retired,  Tobacco Use   Smoking status: Never   Smokeless tobacco: Never   Tobacco comments:    Never smoke 07/16/21  Vaping Use   Vaping status: Never Used  Substance and Sexual Activity   Alcohol use: Never    Alcohol/week: 0.0 standard drinks of alcohol   Drug use: No   Sexual activity: Yes    Birth control/protection: Post-menopausal    Comment: 1st intercourse 73 yo-Fewer than 5 partners  Other Topics Concern   Not on file  Social History Narrative   Lives along with her dog. She enjoys croteching, crafts, making wreaths, reading and gardening in her free time.    Social Drivers of Corporate investment banker Strain: Low Risk  (03/23/2023)   Overall Financial Resource Strain (CARDIA)    Difficulty of Paying Living Expenses: Not very hard  Food Insecurity: No Food Insecurity (03/23/2023)   Hunger Vital Sign    Worried About Running Out of Food in the Last Year: Never true    Ran Out of Food in the Last Year: Never true  Transportation Needs: No Transportation Needs (03/23/2023)    PRAPARE - Administrator, Civil Service (Medical): No    Lack of Transportation (Non-Medical): No  Physical Activity: Insufficiently Active (03/23/2023)   Exercise Vital Sign    Days of Exercise per Week: 3 days    Minutes of Exercise per Session: 30 min  Stress: Stress Concern Present (03/23/2023)   [employer] of Occupational Health - Occupational Stress Questionnaire    Feeling of Stress : Rather much  Social Connections: Moderately Integrated (03/23/2023)   Social Connection and Isolation Panel [NHANES]    Frequency of Communication with Friends and Family: More than three times a week    Frequency of Social Gatherings with Friends and Family: Three times a week    Attends Religious Services: More than 4 times per year    Active Member of Clubs or Organizations: Yes    Attends Banker Meetings: More than 4 times per year    Marital Status: Divorced  Intimate Partner Violence: Not At Risk (12/17/2022)   Received from [facility]   HITS    Over the last 12 months how often did your partner physically hurt you?: Never    Over the last 12 months how often did your partner insult you or talk down to you?: Never    Over the last 12 months how often did your partner threaten you with physical harm?: Never    Over the last 12 months how often did your partner scream or curse at you?: Never   Family History  Problem Relation Age of Onset   Cancer Father        prostate   Breast cancer Paternal Aunt 37   Diabetes Maternal Grandmother    Heart disease Maternal Grandmother    Heart disease Maternal Grandfather    Melanoma Paternal Aunt    Neuropathy Mother    Congestive Heart Failure Mother    Thyroid disease Daughter    BP 122/82   Pulse 86   Wt 80.5 kg (177 lb 6.4 oz)   SpO2 97%   BMI 30.45 kg/m   Wt Readings from Last 3 Encounters:  03/26/23 80.5 kg (  177 lb 6.4 oz)  03/24/23 79.4 kg (175 lb)  12/02/22 77.1 kg (170 lb)   PHYSICAL  EXAM: General:  NAD. No resp difficulty, walked into clinic HEENT: Normal Neck: Supple. No JVD. Cor: Regular rate & rhythm. No rubs, gallops or murmurs. Lungs: Clear Abdomen: Soft, nontender, nondistended.  Extremities: No cyanosis, clubbing, rash, edema Neuro: Alert & oriented x 3, moves all 4 extremities w/o difficulty. Affect pleasant.  ASSESSMENT & PLAN: 1. Chronic diastolic CHF: TEE in 6/23 showed normal LV size with EF 60-65%, mild RV dysfunction, severe LAE with moderate-severe central MR (likely atrial functional MR). Suspect CHF exacerbation in 6/23 was triggered by atrial fibrillation, also mitral regurgitation may have been worsened by atrial fibrillation.  She had a prior admission with CHF in the setting of atrial fibrillation, consistent with flash pulmonary edema. Echo in 3/24 showed  EF 55-60%, normal RV, mild MR. She is not volume overloaded on exam, rarely takes Lasix.  NYHA class .   - Need to keep her in NSR as AF appears to precipitate CHF.  - Continue spironolactone 12.5 mg daily. BMET today - Continue Toprol XL 100 mg daily.  - Continue Farxiga 10 mg daily. No GU symptoms. - Continue Lasix 20 mg PRN. - Ischemia as a contributor to flash pulmonary edema is possible though less likely. She has not been cathed.  - Update echo soon. 2. Atrial fibrillation: Persistent.  She was on Tikosyn in the past then had AF ablation in 2/21, Tikosyn stopped after this.  She was not on an antiarrhythmic prior to admission.   She was admitted in 5/23 with AF and CHF exacerbation. She has also been noted to have developed hyperthyroidism prior to this admission, this can help trigger AF. Need to keep in NSR due to worsening of HF in AF.  DCCV 07/05/21 failed, but she was still on low dose norepinephrine. DCCV 07/09/21 successful. Amiodarone is not an ideal medication for her (h/o hyperthyroidism). Would like to avoid this long-term. She had re-do atrial fibrillation ablation with Dr. Elberta Fortis  11/23/21. She is in NSR today and amiodarone has been stopped. - Continue Eliquis 5 mg bid. No  bleeding issues, CBC today. - She is over-due for EP follow up, will see if we can reach out to office to arrange. 3. Hyperthyroidism: She had this prior to 5/23 admission (prior to amiodarone use).  Has multinodular goiter. She sees endocrinology. Now off amiodarone.  - Continue methimazole, endocrinology following and tapering down. May be able to stop soon. - If she needs thyroid biopsy, ok to hold Eliquis 2 days prior and day of.  4. CAD: borderline calcium score, 72nd percentile.  - Continue Crestor, check LFTs/lipids today - She is over-due for Gen Cards follow up. We are happy to follow her here for her general cardiology needs, in an effort to reduce appointment burden.   Follow up 6 months with Dr. Kathreen Cornfield [facility] FNP-BC 03/26/2023

## 2023-03-26 NOTE — Patient Instructions (Addendum)
 Medication Changes:  No medication changes today!  Lab Work:  Labs done today, your results will be available in MyChart, we will contact you for abnormal readings.   Testing/Procedures:  Your physician has requested that you have an echocardiogram. Echocardiography is a painless test that uses sound waves to create images of your heart. It provides your doctor with information about the size and shape of your heart and how well your heart's chambers and valves are working. This procedure takes approximately one hour. There are no restrictions for this procedure. Please do NOT wear cologne, perfume, aftershave, or lotions (deodorant is allowed). Please arrive 15 minutes prior to your appointment time.  Please note: We ask at that you not bring children with you during ultrasound (echo/ vascular) testing. Due to room size and safety concerns, children are not allowed in the ultrasound rooms during exams. Our front office staff cannot provide observation of children in our lobby area while testing is being conducted. An adult accompanying a patient to their appointment will only be allowed in the ultrasound room at the discretion of the ultrasound technician under special circumstances. We apologize for any inconvenience.     Follow-Up in: Please follow up with the [facility] in 6 months with Dr. Shirlee Latch. We currently do not have that schedule. Please call us back in August in order to schedule your appointment for September.  Please also follow up with Drs. Camnitz and [employer]. The number to their offices is [phone]  At the [facility], you and your health needs are our priority. We have a designated team specialized in the treatment of Heart Failure. This Care Team includes your primary Heart Failure Specialized Cardiologist (physician), Advanced Practice Providers (APPs- Physician Assistants and Nurse Practitioners), and Pharmacist who all  work together to provide you with the care you need, when you need it.   You may see any of the following providers on your designated Care Team at your next follow up:  Dr. Arvilla Meres Dr. Marca Ancona Dr. Dorthula Nettles Dr. Theresia Bough Tonye Becket, NP Robbie Lis, [place] [facility] [place], [place] Brynda Peon, NP [place] Lee, NP Karle Plumber, PharmD   Please be sure to bring in all your medications bottles to every appointment.   Need to Contact us:  If you have any questions or concerns before your next appointment please send [place] a message through [place] or call our office at [phone].    TO LEAVE A MESSAGE FOR THE NURSE SELECT OPTION 2, PLEASE LEAVE A MESSAGE INCLUDING: YOUR NAME DATE OF BIRTH CALL BACK NUMBER REASON FOR CALL**this is important as we prioritize the call backs  YOU WILL RECEIVE A CALL BACK THE SAME DAY AS LONG AS YOU CALL BEFORE 4:00 PM

## 2023-03-26 NOTE — Addendum Note (Signed)
 Encounter addended by: Nicole Cella, RN on: 03/26/2023 10:58 AM  Actions taken: Clinical Note Signed

## 2023-03-27 ENCOUNTER — Telehealth (HOSPITAL_COMMUNITY): Payer: Self-pay

## 2023-03-27 DIAGNOSIS — I5032 Chronic diastolic (congestive) heart failure: Secondary | ICD-10-CM

## 2023-03-27 NOTE — Telephone Encounter (Signed)
-----   Message from Jacklynn Ganong sent at 03/26/2023  4:20 PM EDT ----- K is mildly low. Please make sure to take KCL supplement when taking Lasix (this is PRN for her). Increase K in diet.  Please repeat BMET in 2 weeks to follow

## 2023-03-27 NOTE — Telephone Encounter (Signed)
 Spoke with patient regarding the following results. Patient made aware and patient verbalized understanding.   Patient scheduled for repeat blood work and this has been ordered and scheduled.   Advised patient to call back to office with any issues, questions, or concerns. Patient verbalized understanding.

## 2023-03-31 ENCOUNTER — Other Ambulatory Visit: Payer: Self-pay | Admitting: Family Medicine

## 2023-04-02 ENCOUNTER — Ambulatory Visit: Admitting: Dermatology

## 2023-04-02 ENCOUNTER — Encounter: Payer: Self-pay | Admitting: Dermatology

## 2023-04-02 VITALS — BP 171/117 | HR 84

## 2023-04-02 DIAGNOSIS — L929 Granulomatous disorder of the skin and subcutaneous tissue, unspecified: Secondary | ICD-10-CM

## 2023-04-02 DIAGNOSIS — L905 Scar conditions and fibrosis of skin: Secondary | ICD-10-CM

## 2023-04-02 DIAGNOSIS — L539 Erythematous condition, unspecified: Secondary | ICD-10-CM

## 2023-04-02 DIAGNOSIS — C4492 Squamous cell carcinoma of skin, unspecified: Secondary | ICD-10-CM

## 2023-04-02 DIAGNOSIS — B6013 Keratoconjunctivitis due to Acanthamoeba: Secondary | ICD-10-CM | POA: Diagnosis not present

## 2023-04-02 DIAGNOSIS — Z85828 Personal history of other malignant neoplasm of skin: Secondary | ICD-10-CM | POA: Diagnosis not present

## 2023-04-02 DIAGNOSIS — T1490XD Injury, unspecified, subsequent encounter: Secondary | ICD-10-CM

## 2023-04-02 NOTE — Progress Notes (Signed)
   Follow Up Visit   Subjective  [place] Marquez is a 73 y.o. female who presents for the following: follow up from Mohs surgery   The patient presents for follow up from Mohs surgery for a SCC on the left lower ankle, treated on 01/22/23, repaired with second intention. The patient has been bandaging the wound as directed. The endorse the following concerns: No questions or concerns about leg. She feels like things are healing well. She is applying silicone scar sheets to the superior lesion and is still applying ointment to the inferior lesion.  The following portions of the chart were reviewed this encounter and updated as appropriate: medications, allergies, medical history  Review of Systems:  No other skin or systemic complaints except as noted in HPI or Assessment and Plan.  Objective  Well appearing patient in no apparent distress; mood and affect are within normal limits.  A full examination was performed including scalp, head, face and left lower leg. All findings within normal limits unless otherwise noted below.  Healing wound with mild erythema  Relevant physical exam findings are noted in the Assessment and Plan.     Assessment & Plan   Healing s/p Mohs for SCC, treated on 01/22/2023 left lower leg near ankle, healing by second intention - Reassured that wound is healing well. Debrided today followed by mupirocin and bandage. - No evidence of infection - No swelling, induration, purulence, dehiscence, or tenderness out of proportion to the clinical exam, see photo above - Discussed that scars take up to 12 months to mature from the date of surgery - Recommend SPF 30+ to scar daily to prevent purple color from UV exposure during scar maturation process - Discussed that erythema and raised appearance of scar will fade over the next 4-6 months - OK to start scar massage at 4-6 weeks post-op - Can consider silicone based products for scar healing starting at 6 weeks post-op -  Ok to continue mupirocin ointment daily to wound under a bandage for 5-7 days   Healing s/p Mohs for [facility] with proud flesh, treated on 01/06/2023 left lower leg, healed by second intention - Reassured that wound is healed well. - No evidence of infection - No swelling, induration, purulence, dehiscence, or tenderness out of proportion to the clinical exam, see photo above - Discussed that scars take up to 12 months to mature from the date of surgery - Recommend SPF 30+ to scar daily to prevent purple color from UV exposure during scar maturation process - Discussed that erythema and raised appearance of scar will fade over the next 4-6 months  HISTORY OF SQUAMOUS CELL CARCINOMA OF THE SKIN - No evidence of recurrence today - No lymphadenopathy - Recommend regular full body skin exams - Recommend daily broad spectrum sunscreen SPF 30+ to sun-exposed areas, reapply every 2 hours as needed.  - Call if any new or changing lesions are noted between office visits  Return if symptoms worsen or fail to improve.  I, Manual Meier, Surg Tech III, am acting as scribe for Gwenith Daily, MD.   Documentation: I have reviewed the above documentation for accuracy and completeness, and I agree with the above.  Gwenith Daily, MD

## 2023-04-02 NOTE — Patient Instructions (Signed)

## 2023-04-07 ENCOUNTER — Ambulatory Visit: Admitting: Behavioral Health

## 2023-04-07 DIAGNOSIS — Z634 Disappearance and death of family member: Secondary | ICD-10-CM | POA: Diagnosis not present

## 2023-04-07 DIAGNOSIS — F4323 Adjustment disorder with mixed anxiety and depressed mood: Secondary | ICD-10-CM | POA: Diagnosis not present

## 2023-04-07 DIAGNOSIS — F4321 Adjustment disorder with depressed mood: Secondary | ICD-10-CM

## 2023-04-07 NOTE — Progress Notes (Signed)
   Deneise Lever, LMFT

## 2023-04-07 NOTE — Progress Notes (Addendum)
 [facility] Counselor/Therapist Progress Note  Patient ID: [place], MRN: [id number],    Date: 04/07/2023  Time Spent: 55 min In Person @ [facility] Office Time In: 2:00pm Time Out: 2:55pm   Treatment Type: Individual Therapy  Reported Symptoms: Elevated anx/dep & grief since the untimely death of her Dtr & the way her Sig Other & Family are handling the death.  Mental Status Exam: Appearance:  Casual and Neat     Behavior: Appropriate and Sharing  Motor: Normal  Speech/Language:  Clear and Coherent  Affect: Appropriate  Mood: sad  Thought process: normal  Thought content:   WNL  Sensory/Perceptual disturbances:   WNL  Orientation: oriented to person, place, time/date, and situation  Attention: Good  Concentration: Good  Memory: WNL  Fund of knowledge:  Good  Insight:   Good  Judgment:  Good  Impulse Control: Good   Risk Assessment: Danger to Self:  No Self-injurious Behavior: No Danger to Others: No Duty to Warn:no Physical Aggression / Violence:No  Access to Firearms a concern: No  Gang Involvement:No   Subjective: Pt is in chg of her Dtr's Estate & has duties she much complete so the [place] can be closed. The Final Inventory has been difficult for Pt & she has secured an extention until May. She has sent a letter to her Dtr's BF & a copy to his Father. She has not found her Dtr's wallet, jewelry, or important ppw.   Pt's Dtr has been w/BF for some yrs. He has posted on [employer], items that belong to her Dtr for the purpose of selling these items. This has upset Pt tremendously. BF will have to appear in Superior Court to define the situation more clearly & for purposes of clearing up the [name]. Evidently, BF has posted things on [employer]book in the past that were verbally & emot'ly abusive to Pt's Dtr [place].  Pt is unclear on the cause of death. The Death Cert sts 'Hypoxia of the brain due to MI'.  Dtr & Pt have Hx of hosp'tzn due to Dtr's Hx of abd bleed  w/a 2 wk Hosp stay & Pt's overoxygenation post hysterectomy & resulting Tx of 3 PE status. In 2022, Pt went to the ED for SOB & stayed 7days for Tx. Afterwards, she stayed another 4d in [place] for mitral valve ablation. Dtr tended to her consistently.   Pt reports, No one needs me anymore. Her Anna [place] is 74yo & a lot like his Mother. [place] & Pt are close. All along, Dtr [place] has blamed herself for her Father's leaving when she was 4yo. She wanted her Dad to read a book before bed & instead Dad lft the Family.   Family has been shouldering grief for many yrs now. Health status changes have plagued Pt & she is doing her best to care for her health.  Interventions: Grief Therapy and Family Systems  Diagnosis:Grief at loss of child  Adjustment disorder with mixed anxiety and depressed mood  Plan: [place] is struggling to deal w/the unexpected & untimely death of her Dtr [place] while @ work for [place] in [place]. The cause & circumstances surrounding her death have been confusing & agonizing for Anna Marquez as she cannot get a Report of the Incident from [place] (the [place] that secured the job for [place]). The Autopsy results add to the confusion. Anna Marquez is consulting w/an Atty who is helpful & doing her best to manage her grief at the same  time. She will take the discussions we engage in during session to ease her heart & mind, to find hopefulness in Anna Marquez's character & her Family's love for her. Anna [place] will cont to attend Grief Share @ [place] as this is helping her cope w/others in the same situation.   Target Date: 04/22/2023  Progress: 5  Frequency: Once every 2-3 wks  Modality: Anna Richerd LITTIE Hollace, LMFT

## 2023-04-09 DIAGNOSIS — B6013 Keratoconjunctivitis due to Acanthamoeba: Secondary | ICD-10-CM | POA: Diagnosis not present

## 2023-04-10 ENCOUNTER — Ambulatory Visit (HOSPITAL_COMMUNITY)
Admission: RE | Admit: 2023-04-10 | Discharge: 2023-04-10 | Disposition: A | Discharge status: Home / Self Care | Source: Ambulatory Visit | Attending: Internal Medicine | Admitting: Internal Medicine

## 2023-04-10 DIAGNOSIS — I5032 Chronic diastolic (congestive) heart failure: Secondary | ICD-10-CM | POA: Diagnosis not present

## 2023-04-10 LAB — BASIC METABOLIC PANEL WITH GFR
Anion gap: 11 (ref 5–15)
BUN: 13 mg/dL (ref 8–23)
CO2: 20 mmol/L — ABNORMAL LOW (ref 22–32)
Calcium: 9.4 mg/dL (ref 8.9–10.3)
Chloride: 109 mmol/L (ref 98–111)
Creatinine, Ser: 0.96 mg/dL (ref 0.44–1.00)
GFR, Estimated: >60 mL/min (ref >60)
Glucose, Bld: 98 mg/dL (ref 70–99)
Potassium: 3.6 mmol/L (ref 3.5–5.1)
Sodium: 140 mmol/L (ref 135–145)

## 2023-04-14 ENCOUNTER — Other Ambulatory Visit (HOSPITAL_COMMUNITY): Payer: Self-pay | Admitting: Family Medicine

## 2023-04-17 ENCOUNTER — Ambulatory Visit (HOSPITAL_COMMUNITY)
Admission: RE | Admit: 2023-04-17 | Discharge: 2023-04-17 | Disposition: A | Discharge status: Home / Self Care | Source: Ambulatory Visit | Attending: Family Medicine | Admitting: Family Medicine

## 2023-04-17 DIAGNOSIS — I080 Rheumatic disorders of both mitral and aortic valves: Secondary | ICD-10-CM | POA: Insufficient documentation

## 2023-04-17 DIAGNOSIS — I5032 Chronic diastolic (congestive) heart failure: Secondary | ICD-10-CM | POA: Diagnosis not present

## 2023-04-17 LAB — ECHOCARDIOGRAM COMPLETE
Area-P 1/2: 4.08 cm2
S' Lateral: 2.9 cm

## 2023-04-17 NOTE — Progress Notes (Unsigned)
 Electrophysiology Office Note:   Date:  04/18/2023  ID:  Anna Marquez, DOB [birth year], MRN [id number]  Primary Cardiologist: Kristeen Miss, MD Primary Heart Failure: None Electrophysiologist: Will Jorja Loa, MD      History of Present Illness:   Anna Marquez is a 73 y.o. female with h/o AF, HFpEF, CAD, hyperthyroidism seen today for routine electrophysiology followup.   Since last being seen in our clinic the patient reports since her last ablation she has had onlyl 2 episode of AF.  She had one in March 2025 that lasted ~ 4 hours.  Her HR ranged from 90-140.  She took her Toprol and it settled out. Her daughter passed away recently and she was having a particularly bad day. She wears an Scientist, physiological and it reports she is typically 2% or less for AF.    She denies chest pain, palpitations, dyspnea, PND, orthopnea, nausea, vomiting, dizziness, syncope, edema, weight gain, or early satiety.   Review of systems complete and found to be negative unless listed in HPI.   EP Information / Studies Reviewed:    EKG is not ordered today. EKG from 03/26/23 reviewed which showed NSR 85 bpm       Studies:  EPS 02/11/2019 > AF on presentation, successful electrical isolation & anatomical encircling of all four pulmonary veins with RF, additional LA ablation performed of the posterior wall of the LA ECHO 06/2021 > LVEF 50-55% CT Cardiac Morphology 11/2021 > normal pulmonary vein drainage into LA, CCS 174 / 72nd percentile for matched controls EPS 11/23/2021 > AF on presentation, RF re-do ablation of all four pulmonary veins, additional LA posterior wall ablation ECHO 04/17/23 > LVEF 55-60%, G1DD  Arrhythmia / AAD AF  Tikosyn > stopped 02/2019 post ablation   Amiodarone > initiated 06/2021  DCCV > 07/05/21, 07/09/21   Risk Assessment/Calculations:    CHA2DS2-VASc Score = 4   This indicates a 4.8% annual risk of stroke. The patient's score is based upon: CHF History: 1 HTN History:  1 Diabetes History: 0 Stroke History: 0 Vascular Disease History: 0 Age Score: 1 Gender Score: 1             Physical Exam:   VS:  BP 116/76   Pulse 89   Ht 5\' 4"  (1.626 m)   Wt 172 lb (78 kg)   SpO2 96%   BMI 29.52 kg/m    Wt Readings from Last 3 Encounters:  04/18/23 172 lb (78 kg)  03/26/23 177 lb 6.4 oz (80.5 kg)  03/24/23 175 lb (79.4 kg)     GEN: Well nourished, well developed in no acute distress NECK: No JVD; No carotid bruits CARDIAC: Regular rate and rhythm, no murmurs, rubs, gallops RESPIRATORY:  Clear to auscultation without rales, wheezing or rhonchi  ABDOMEN: Soft, non-tender, non-distended EXTREMITIES:  No edema; No deformity   ASSESSMENT AND PLAN:    Persistent Atrial Fibrillation  CHA2DS2-VASc 4, s/p ablation x2  -continue OAC for stroke prophylaxis  -Toprol 100 mg daily  -two episodes of AF since 2023 ablation  -reviewed RF ablation with her, AAD options if burden increases  -monitors with an Apple Watch  Secondary Hypercoagulable State  -continue Eliquis 5mg  BID, dose reviewed and appropriate by age / wt   HFpEF  -GDMT per Advanced HF Team   Hypertension  -well controlled on current regimen    Situational Depression  Grief  -support offered, encouraged her ongoing counseling   Follow up with Dr. Elberta Fortis or EP  APP in 6 months  Signed, Canary Brim, NP-C, AGACNP-BC [facility] - Electrophysiology  04/18/2023, 9:51 AM

## 2023-04-18 ENCOUNTER — Encounter: Payer: Self-pay | Admitting: Pulmonary Disease

## 2023-04-18 ENCOUNTER — Ambulatory Visit: Attending: Pulmonary Disease | Admitting: Pulmonary Disease

## 2023-04-18 VITALS — BP 116/76 | HR 89 | Ht 64.0 in | Wt 172.0 lb

## 2023-04-18 DIAGNOSIS — D6869 Other thrombophilia: Secondary | ICD-10-CM

## 2023-04-18 DIAGNOSIS — I4819 Other persistent atrial fibrillation: Secondary | ICD-10-CM | POA: Diagnosis not present

## 2023-04-18 NOTE — Patient Instructions (Signed)
 Medication Instructions:  Your physician recommends that you continue on your current medications as directed. Please refer to the Current Medication list given to you today.  *If you need a refill on your cardiac medications before your next appointment, please call your pharmacy*  Lab Work: None ordered If you have labs (blood work) drawn today and your tests are completely normal, you will receive your results only by: MyChart Message (if you have MyChart) OR A paper copy in the mail If you have any lab test that is abnormal or we need to change your treatment, we will call you to review the results.  Follow-Up: At [facility], you and your health needs are our priority.  As part of our continuing mission to provide you with exceptional heart care, our providers are all part of one team.  This team includes your primary Cardiologist (physician) and Advanced Practice Providers or APPs (Physician Assistants and Nurse Practitioners) who all work together to provide you with the care you need, when you need it.  Your next appointment:   6 month(s)  Provider:   Loman Brooklyn, MD       1st Floor: - Lobby - Registration  - Pharmacy  - Lab - Cafe  2nd Floor: - PV Lab - Diagnostic Testing (echo, CT, nuclear med)  3rd Floor: - Vacant  4th Floor: - TCTS (cardiothoracic surgery) - AFib Clinic - Structural Heart Clinic - Vascular Surgery  - Vascular Ultrasound  5th Floor: - HeartCare Cardiology (general and EP) - Clinical Pharmacy for coumadin, hypertension, lipid, weight-loss medications, and med management appointments    Valet parking services will be available as well.

## 2023-04-22 ENCOUNTER — Ambulatory Visit: Admitting: Cardiovascular Disease

## 2023-04-23 DIAGNOSIS — B6013 Keratoconjunctivitis due to Acanthamoeba: Secondary | ICD-10-CM | POA: Diagnosis not present

## 2023-04-25 DIAGNOSIS — B6013 Keratoconjunctivitis due to Acanthamoeba: Secondary | ICD-10-CM | POA: Diagnosis not present

## 2023-04-28 DIAGNOSIS — B6013 Keratoconjunctivitis due to Acanthamoeba: Secondary | ICD-10-CM | POA: Diagnosis not present

## 2023-04-30 ENCOUNTER — Ambulatory Visit: Admitting: Behavioral Health

## 2023-04-30 DIAGNOSIS — Z634 Disappearance and death of family member: Secondary | ICD-10-CM

## 2023-04-30 DIAGNOSIS — F4323 Adjustment disorder with mixed anxiety and depressed mood: Secondary | ICD-10-CM

## 2023-04-30 DIAGNOSIS — F4321 Adjustment disorder with depressed mood: Secondary | ICD-10-CM

## 2023-04-30 NOTE — Progress Notes (Signed)
   Deneise Lever, LMFT

## 2023-05-05 ENCOUNTER — Encounter: Payer: Self-pay | Admitting: Dermatology

## 2023-05-05 ENCOUNTER — Ambulatory Visit: Admitting: Dermatology

## 2023-05-05 VITALS — BP 122/71 | HR 86

## 2023-05-05 DIAGNOSIS — L905 Scar conditions and fibrosis of skin: Secondary | ICD-10-CM

## 2023-05-05 DIAGNOSIS — D492 Neoplasm of unspecified behavior of bone, soft tissue, and skin: Secondary | ICD-10-CM

## 2023-05-05 DIAGNOSIS — Z85828 Personal history of other malignant neoplasm of skin: Secondary | ICD-10-CM | POA: Diagnosis not present

## 2023-05-05 DIAGNOSIS — D0471 Carcinoma in situ of skin of right lower limb, including hip: Secondary | ICD-10-CM

## 2023-05-05 DIAGNOSIS — D485 Neoplasm of uncertain behavior of skin: Secondary | ICD-10-CM

## 2023-05-05 NOTE — Progress Notes (Signed)
 Follow-Up Visit   Subjective  [place] is a 73 y.o. female who presents for the following: spot of concern. Hx of SCC The patient has spots, moles and lesions to be evaluated, some may be new or changing and the patient may have concern these could be cancer.  Lesion on right leg, tender, present for several months, not previously treated.  She is also following up from Mohs surgery for a SCC on the left lower leg, treated on 01/06/24, healing by second intention, and a SCC on the left lower leg near ankle, treated on 01/22/2023, healing by secondary intention  The following portions of the chart were reviewed this encounter and updated as appropriate: medications, allergies, medical history  Review of Systems:  No other skin or systemic complaints except as noted in HPI or Assessment and Plan.  Objective  Well appearing patient in no apparent distress; mood and affect are within normal limits.    A focused examination was performed of the following areas:  Right ankle  Relevant exam findings are noted in the Assessment and Plan.  Left Lower Leg - Anterior Scar  Right Lower Leg - Anterior 7mm hyperkeratotic papule   Assessment & Plan   Healing s/p Mohs for SCC, treated on 01/22/2023 left lower leg near ankle, healing by second intention - Well healed - No swelling, induration, purulence, dehiscence, or tenderness out of proportion to the clinical exam, see photo above - Discussed that scars take up to 12 months to mature from the date of surgery - Recommend SPF 30+ to scar daily to prevent purple color from UV exposure during scar maturation process - Discussed that erythema and raised appearance of scar will fade over the next 4-6 months - OK to start scar massage at 4-6 weeks post-op - OK to continue to silicone products   Healing s/p Mohs for SCC with proud flesh, treated on 01/06/2023 left lower leg, healed by second intention - Reassured that wound is healed  well. - No evidence of infection - No swelling, induration, purulence, dehiscence, or tenderness out of proportion to the clinical exam, see photo above - Discussed that scars take up to 12 months to mature from the date of surgery - Recommend SPF 30+ to scar daily to prevent purple color from UV exposure during scar maturation process - Discussed that erythema and raised appearance of scar will fade over the next 4-6 months  HISTORY OF SQUAMOUS CELL CARCINOMA OF THE SKIN - No evidence of recurrence today - No lymphadenopathy - Recommend regular full body skin exams - Recommend daily broad spectrum sunscreen SPF 30+ to sun-exposed areas, reapply every 2 hours as needed.  - Call if any new or changing lesions are noted between office visits  HISTORY OF SCC (SQUAMOUS CELL CARCINOMA) OF SKIN Left Lower Leg - Anterior NEOPLASM OF UNCERTAIN BEHAVIOR OF SKIN Right Lower Leg - Anterior Skin / nail biopsy Type of biopsy: tangential   Informed consent: discussed and consent obtained   Timeout: patient name, date of birth, surgical site, and procedure verified   Procedure prep:  Patient was prepped and draped in usual sterile fashion Prep type:  Isopropyl alcohol Anesthesia: the lesion was anesthetized in a standard fashion   Anesthetic:  1% lidocaine  w/ epinephrine  1-100,000 buffered w/ 8.4% NaHCO3 Instrument used: DermaBlade   Hemostasis achieved with: aluminum chloride   Outcome: patient tolerated procedure well   Post-procedure details: sterile dressing applied and wound care instructions given   Dressing  type: petrolatum and bandage   Specimen 1 - Surgical pathology Differential Diagnosis: r/o NMSC vs other  Check Margins: No  Return in about 6 months (around 11/04/2023) for TBSE.  I, Haig Levan, Surg Tech III, am acting as scribe for Deneise Finlay, MD.   Documentation: I have reviewed the above documentation for accuracy and completeness, and I agree with the above.  Deneise Finlay, MD

## 2023-05-05 NOTE — Patient Instructions (Signed)

## 2023-05-07 DIAGNOSIS — H16012 Central corneal ulcer, left eye: Secondary | ICD-10-CM | POA: Diagnosis not present

## 2023-05-07 DIAGNOSIS — B6013 Keratoconjunctivitis due to Acanthamoeba: Secondary | ICD-10-CM | POA: Diagnosis not present

## 2023-05-07 DIAGNOSIS — H18212 Corneal edema secondary to contact lens, left eye: Secondary | ICD-10-CM | POA: Diagnosis not present

## 2023-05-08 LAB — SURGICAL PATHOLOGY

## 2023-05-12 ENCOUNTER — Telehealth: Payer: Self-pay | Admitting: Dermatology

## 2023-05-12 NOTE — Telephone Encounter (Signed)
-----   Message from [facility] PACI sent at 05/09/2023  8:54 AM EDT ----- SCCIS- right lower leg- Mohs   Please call patient to discuss diagnosis and schedule for Mohs surgery.

## 2023-05-12 NOTE — Telephone Encounter (Signed)
 Called and went over results with patient and patient was ok with sending it to be scheduling.

## 2023-05-13 ENCOUNTER — Other Ambulatory Visit: Payer: Self-pay | Admitting: Family Medicine

## 2023-05-14 DIAGNOSIS — B6013 Keratoconjunctivitis due to Acanthamoeba: Secondary | ICD-10-CM | POA: Diagnosis not present

## 2023-05-16 DIAGNOSIS — B6013 Keratoconjunctivitis due to Acanthamoeba: Secondary | ICD-10-CM | POA: Diagnosis not present

## 2023-05-19 NOTE — Progress Notes (Addendum)
 [place] [facility] Counselor/Therapist Progress Note  Patient ID: [place], MRN: [id number],    Date: 03/17/2023  Time Spent: 50 min In Person @ [facility] Office  Time In: 11:00am Time Out: 11:50am  Treatment Type: Individual Therapy  Reported Symptoms: Elevated anx/dep grief rxn & stress due to unexpected death of [place] in [place] workplace late 2024 w/o knowledge or cause of Anna death. [place]'s Sig Other & his Family have been hurtful to [place].  Mental Status Exam: Appearance:  Casual & neat; L corneal eye infection  Behavior: Appropriate and Sharing  Motor: Normal  Speech/Language:  Clear and Coherent  Affect: Appropriate and Tearful  Mood: anxious, depressed, and sad  Thought process: normal  Thought content:   Rumination  Sensory/Perceptual disturbances:   WNL  Orientation: oriented to person, place, time/date, and situation  Attention: Good  Concentration: Good  Memory: WNL  Fund of knowledge:  Good  Insight:   Good  Judgment:  Good  Impulse Control: Good   Risk Assessment: Danger to Self:  No Self-injurious Behavior: No Danger to Others: No Duty to Warn:no Physical Aggression / Violence:No  Access to Firearms a concern: No  Gang Involvement:No   Subjective: [place] has been dealing w/an eye infection for over a month. She has spent many days crying. She has attended a 'Grief Share' Grp w/Anna DIL. She has recalled all the losses in Anna life recently. Anna GFr died when she was 73yo. Anna GM died when she was 73yo. She lost both Anna Parents. This grief all hits Anna @ once as she thinks about [place].    [place]'s children's Fr abandoned the Family when they were rather young. He gave no thought to [place] or the kids. He just walked out. This was hurtful to [place]. She thinks about the 74yo Son of Anna Marquez's Parents ([employer]) who died after driving to purchase more ETOH when he was inebriated. He was @ his GF's home & he was in a MVA which resulted in serious injury. His  GF lft him & he perished. She realizes their Family also endured great loss.   [place] feels [place] daily. Anna spirit visits & she can hear Anna voice telling [place] not to be so sad. [place] comes in Anna dreams. [place] had given Anna BF an ultimatum on a [address] Trip last year to curb his own drinking by Christmas or else. She was exp'g more bad days than good days before Anna death. Anna [place]quez thinks about Anna Marquez's life often.  [place] has secured an Atty to help w/Anna Marquez's Estate. He is sending a formal letter to Anna BF re: the close of the [facility] & his responsibility to give [place] all of [place]quez's belongings.  Interventions: Grief Therapy  Diagnosis:Grief at loss of child  Adjustment disorder with mixed anxiety and depressed mood  Plan: Ardelle Kos will cont to record Anna feelings daily in order to process Anna grief for the death of [place]. She is trying to remain positive & be thoughtful about Anna Marquez's BF & his beh. She still hopes to secure Anna [place]quez's personal papers. She expresses positive points about the Grief Share Grp & is glad to attend this w/Anna DIL. She is working towards acceptance & forgiveness.   Target Date: 04/08/2023  Progress: 6  Frequency: Once every 2-3 wks  Modality: Deborrah Fam, LMFT

## 2023-05-20 ENCOUNTER — Ambulatory Visit: Admitting: Behavioral Health

## 2023-05-21 DIAGNOSIS — B6013 Keratoconjunctivitis due to Acanthamoeba: Secondary | ICD-10-CM | POA: Diagnosis not present

## 2023-05-21 DIAGNOSIS — H16012 Central corneal ulcer, left eye: Secondary | ICD-10-CM | POA: Diagnosis not present

## 2023-05-22 ENCOUNTER — Ambulatory Visit: Admitting: Behavioral Health

## 2023-05-27 ENCOUNTER — Ambulatory Visit: Admitting: Dermatology

## 2023-05-27 ENCOUNTER — Encounter: Payer: Self-pay | Admitting: Dermatology

## 2023-05-27 VITALS — BP 129/71 | HR 71 | Temp 98.8°F

## 2023-05-27 DIAGNOSIS — C4492 Squamous cell carcinoma of skin, unspecified: Secondary | ICD-10-CM

## 2023-05-27 DIAGNOSIS — D0471 Carcinoma in situ of skin of right lower limb, including hip: Secondary | ICD-10-CM

## 2023-05-27 DIAGNOSIS — L905 Scar conditions and fibrosis of skin: Secondary | ICD-10-CM | POA: Diagnosis not present

## 2023-05-27 DIAGNOSIS — Z85828 Personal history of other malignant neoplasm of skin: Secondary | ICD-10-CM | POA: Diagnosis not present

## 2023-05-27 DIAGNOSIS — L929 Granulomatous disorder of the skin and subcutaneous tissue, unspecified: Secondary | ICD-10-CM | POA: Diagnosis not present

## 2023-05-27 DIAGNOSIS — L814 Other melanin hyperpigmentation: Secondary | ICD-10-CM | POA: Diagnosis not present

## 2023-05-27 DIAGNOSIS — T1490XD Injury, unspecified, subsequent encounter: Secondary | ICD-10-CM

## 2023-05-27 DIAGNOSIS — L579 Skin changes due to chronic exposure to nonionizing radiation, unspecified: Secondary | ICD-10-CM

## 2023-05-27 NOTE — Progress Notes (Signed)
 Follow-Up Visit   Subjective  Anna Marquez is a 73 y.o. female who presents for the following: Mohs of a Squamous Cell Carcinoma in Situ of the right lower leg, biopsied by Dr. Fain Home.   She is also s/p  Mohs surgery for a SCC on the left lower leg, treated on 01/06/24, healing by second intention, and a SCC on the left lower leg near ankle, treated on 01/22/2023, healing by secondary intention.  The following portions of the chart were reviewed this encounter and updated as appropriate: medications, allergies, medical history  Review of Systems:  No other skin or systemic complaints except as noted in HPI or Assessment and Plan.  Objective  Well appearing patient in no apparent distress; mood and affect are within normal limits.  A focused examination was performed of the following areas: Right lower leg Relevant physical exam findings are noted in the Assessment and Plan.   Right Lower Leg Healing biopsy site   Assessment & Plan   SQUAMOUS CELL CARCINOMA OF SKIN Right Lower Leg Mohs surgery  Consent obtained: written  Anticoagulation: Is the patient taking prescription anticoagulant and/or aspirin  prescribed/recommended by a physician? Yes   Was the anticoagulation regimen changed prior to Mohs? No    Anesthesia: Anesthesia method: local infiltration Local anesthetic: lidocaine  1% WITH epi  Procedure Details: Timeout: pre-procedure verification complete Procedure Prep: patient was prepped and draped in usual sterile fashion Prep type: chlorhexidine  Biopsy accession number: [id number] Date of biopsy: 05/05/2023 Frozen section biopsy performed: No   Pre-Op diagnosis: squamous cell carcinoma SCC subtype: in situ MohsAIQ Surgical site (if tumor spans multiple areas, please select predominant area): shin Surgery side: right Surgical site (from skin exam): Right Lower Leg Pre-operative length (cm): 1.1 Pre-operative width (cm): 1 Indications for Mohs surgery:  anatomic location where tissue conservation is critical Previously treated? No    Micrographic Surgery Details: Post-operative length (cm): 1.6 Post-operative width (cm): 1.8 Number of Mohs stages: 1  Stage 1    Tumor features identified on Mohs section: no tumor identified  Reconstruction: Was the defect reconstructed?: No    Opioids: Did the patient receive a prescription for opioid/narcotic related to Mohs surgery?: No    Antibiotics: Does patient meet AHA guidelines for endocarditis?: No   Does patient meet AHA guidelines for orthopedic prophylaxis?: No   Were antibiotics given on the day of surgery?: No   Did surgery breach mucosa, expose cartilage/bone, involve an area of lymphedema/inflamed/infected tissue? No   HISTORY OF SCC (SQUAMOUS CELL CARCINOMA) OF SKIN   HEALING WOUND   SCAR    Healing s/p Mohs for SCC, treated on 01/22/2023 left lower leg near ankle, healing by second intention - Well healed - No swelling, induration, purulence, dehiscence, or tenderness out of proportion to the clinical exam, see photo above - Discussed that scars take up to 12 months to mature from the date of surgery - Recommend SPF 30+ to scar daily to prevent purple color from UV exposure during scar maturation process - Discussed that erythema and raised appearance of scar will fade over the next 4-6 months - OK to start scar massage at 4-6 weeks post-op - OK to continue to silicone products   Healing s/p Mohs for [facility] with proud flesh, treated on 01/06/2023 left lower leg, healed by second intention - Reassured that wound is healed well. - No evidence of infection - No swelling, induration, purulence, dehiscence, or tenderness out of proportion to the clinical exam, see photo  above - Discussed that scars take up to 12 months to mature from the date of surgery - Recommend SPF 30+ to scar daily to prevent purple color from UV exposure during scar maturation process - Discussed that  erythema and raised appearance of scar will fade over the next 4-6 months  HISTORY OF SQUAMOUS CELL CARCINOMA OF THE SKIN - No evidence of recurrence today - No lymphadenopathy - Recommend regular full body skin exams - Recommend daily broad spectrum sunscreen SPF 30+ to sun-exposed areas, reapply every 2 hours as needed.  - Call if any new or changing lesions are noted between office visits  Return in about 4 weeks (around 06/24/2023).  Maurine Sovereign, RN, am acting as scribe for Deneise Finlay, MD .   05/27/2023  HISTORY OF PRESENT ILLNESS  Anna Marquez is seen in consultation at the request of Dr. Fain Home for biopsy-proven Squamous Carcinoma in Situ of the right shin. They note that the area has been present for about 2 months increasing in size with time.  There is no history of previous treatment.  Reports no other new or changing lesions and has no other complaints today.  Medications and allergies: see patient chart.  Review of systems: Reviewed 8 systems and notable for the above skin cancer.  All other systems reviewed are unremarkable/negative, unless noted in the HPI. Past medical history, surgical history, family history, social history were also reviewed and are noted in the chart/questionnaire.    PHYSICAL EXAMINATION  General: Well-appearing, in no acute distress, alert and oriented x 4. Vitals reviewed in chart (if available).   Skin: Exam reveals a 1.1 x 1.0 cm erythematous papule and biopsy scar on the right shin. There are rhytids, telangiectasias, and lentigines, consistent with photodamage.   Biopsy report(s) reviewed, confirming the diagnosis.   ASSESSMENT  1) Squamous Cell Carcinoma in Situ of the right shin 2) photodamage 3) solar lentigines   PLAN   1. Due to location, size, histology, or recurrence and the likelihood of subclinical extension as well as the need to conserve normal surrounding tissue, the patient was deemed acceptable for Mohs  micrographic surgery (MMS).  The nature and purpose of the procedure, associated benefits and risks including recurrence and scarring, possible complications such as pain, infection, and bleeding, and alternative methods of treatment if appropriate were discussed with the patient during consent. The lesion location was verified by the patient, by reviewing previous notes, pathology reports, and by photographs as well as angulation measurements if available.  Informed consent was reviewed and signed by the patient, and timeout was performed at 9:30 AM. See op note below.  2. For the photodamage and solar lentigines, sun protection discussed/information given on OTC sunscreens, and we recommend continued regular follow-up with primary dermatologist every 6 months or sooner for any growing, bleeding, or changing lesions. 3. Prognosis and future surveillance discussed. 4. Letter with treatment outcome sent to referring provider. 5. Pain acetaminophen /ibuprofen   MOHS MICROGRAPHIC SURGERY AND RECONSTRUCTION  Initial size:   1.1 x 1.0 cm Surgical defect/wound size: 1.6 x 1.8 cm Anesthesia:    0.33% lidocaine  with 1:200,000 epinephrine  EBL:    <5 mL Complications:  None Repair type:   Secondary Intention   Stages: 1  STAGE I: Anesthesia achieved with 0.5% lidocaine  with 1:200,000 epinephrine . ChloraPrep applied. 1 section(s) excised using Mohs technique (this includes total peripheral and deep tissue margin excision and evaluation with frozen sections, excised and interpreted by the same physician). The tumor  was first debulked and then excised with an approx. 2mm margin.  Hemostasis was achieved with electrocautery as needed.  The specimen was then oriented, subdivided/relaxed, inked, and processed using Mohs technique.    Frozen section analysis revealed a clear deep and peripheral margin.   Reconstruction  Patient was notified of results and repair options were discussed, including second  intention healing. After reviewing the advantages and disadvantages of each, we agreed on second intention healing as appropriate.   The surgical site was then lightly scrubbed with sterile, saline-soaked gauze.  The area was bandaged using Vaseline ointment, non-adherent gauze, gauze pads, and tape to provide an adequate pressure dressing.   The patient tolerated the procedure well, was given detailed written and verbal wound care instructions, and was discharged in good condition.  The patient will follow-up in 4 weeks and as scheduled with primary dermatologist.   Documentation: I have reviewed the above documentation for accuracy and completeness, and I agree with the above.  Deneise Finlay, MD

## 2023-05-27 NOTE — Patient Instructions (Signed)

## 2023-05-29 ENCOUNTER — Ambulatory Visit: Admitting: Behavioral Health

## 2023-05-30 DIAGNOSIS — B6013 Keratoconjunctivitis due to Acanthamoeba: Secondary | ICD-10-CM | POA: Diagnosis not present

## 2023-06-04 ENCOUNTER — Other Ambulatory Visit (HOSPITAL_COMMUNITY): Payer: Self-pay | Admitting: Cardiology

## 2023-06-09 ENCOUNTER — Ambulatory Visit: Admitting: Behavioral Health

## 2023-06-09 DIAGNOSIS — F4323 Adjustment disorder with mixed anxiety and depressed mood: Secondary | ICD-10-CM | POA: Diagnosis not present

## 2023-06-09 DIAGNOSIS — F4321 Adjustment disorder with depressed mood: Secondary | ICD-10-CM

## 2023-06-09 NOTE — Progress Notes (Signed)
 [facility] Counselor/Therapist Progress Note  Patient ID: Anna Marquez, MRN: [id number],    Date: 06/09/2023  Time Spent: 60 min Caregility video; Pt is home in private & Provider working remotely from [employer]. Pt is aware of the risks/limitations of telehealth & consents to Tx today. Time In: 10:00am Time Out: 10:50am  Treatment Type: Individual Therapy  Reported Symptoms: Elevated anx/dep & grief rxn due to death of Dtr [name]  Mental Status Exam: Appearance:  Neat     Behavior: Appropriate and Sharing  Motor: Normal  Speech/Language:  Clear and Coherent  Affect: Tearful  Mood: depressed and sad  Thought process: normal  Thought content:   WNL  Sensory/Perceptual disturbances:   WNL  Orientation: oriented to person, place, time/date, and situation  Attention: Good  Concentration: Good  Memory: WNL  Fund of knowledge:  Good  Insight:   Good  Judgment:  Good  Impulse Control: Good   Risk Assessment: Danger to Self:  No Self-injurious Behavior: No Danger to Others: No Duty to Warn:no Physical Aggression / Violence:No  Access to Firearms a concern: No  Gang Involvement:No   Subjective: Pt is deeply grieving the death of her Dtr [name] in the Fall of 2024. 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 She is missing her terribly & the lack of her presence with Family time.   She is struggling to come to terms w/her Dtr's death. Everyday is a struggle. Pt is giving back in memory of her Dtr in multiple ways that serve others.  Dtr's Sig Other at the end of her life has contacted Pt's Sig Other to alert her to the pics he has lft for her in his mailbox. Pt is hesitant to retrieve them & her Sig Other Myrtie Atkinson will secure them instead.   Interventions: Grief Therapy  Diagnosis:Grief at loss of child  Adjustment disorder with mixed anxiety and depressed mood  Plan: Pt  will consider the suggestions made today so she can adjust & cope w/her grief. She will remember the legacy of her Dtr is intimately tied to the grief she carries from other losses in her life that are deep. Pt was reminded of the network of love her Dtr is connected to & a part of-the Family has valuable meanings in the way they care for ea other. Pt will record in a Notebook as this practice is useful to her.  Target Date: 07/07/2023  Progress: 6  Frequency: Once every 2-3 wks  Modality: Deborrah Fam, LMFT

## 2023-06-09 NOTE — Progress Notes (Signed)
   Deneise Lever, LMFT

## 2023-06-11 ENCOUNTER — Encounter: Payer: Self-pay | Admitting: Dermatology

## 2023-06-16 DIAGNOSIS — E0590 Thyrotoxicosis, unspecified without thyrotoxic crisis or storm: Secondary | ICD-10-CM | POA: Diagnosis not present

## 2023-06-18 DIAGNOSIS — E042 Nontoxic multinodular goiter: Secondary | ICD-10-CM | POA: Diagnosis not present

## 2023-06-18 DIAGNOSIS — E0590 Thyrotoxicosis, unspecified without thyrotoxic crisis or storm: Secondary | ICD-10-CM | POA: Diagnosis not present

## 2023-06-18 DIAGNOSIS — Z1330 Encounter for screening examination for mental health and behavioral disorders, unspecified: Secondary | ICD-10-CM | POA: Diagnosis not present

## 2023-06-24 DIAGNOSIS — B6013 Keratoconjunctivitis due to Acanthamoeba: Secondary | ICD-10-CM | POA: Diagnosis not present

## 2023-06-26 ENCOUNTER — Encounter: Payer: Self-pay | Admitting: Dermatology

## 2023-06-26 ENCOUNTER — Ambulatory Visit: Admitting: Dermatology

## 2023-06-26 VITALS — BP 130/79 | HR 84

## 2023-06-26 DIAGNOSIS — D485 Neoplasm of uncertain behavior of skin: Secondary | ICD-10-CM | POA: Diagnosis not present

## 2023-06-26 DIAGNOSIS — D492 Neoplasm of unspecified behavior of bone, soft tissue, and skin: Secondary | ICD-10-CM | POA: Diagnosis not present

## 2023-06-26 DIAGNOSIS — L539 Erythematous condition, unspecified: Secondary | ICD-10-CM | POA: Diagnosis not present

## 2023-06-26 DIAGNOSIS — C44722 Squamous cell carcinoma of skin of right lower limb, including hip: Secondary | ICD-10-CM | POA: Diagnosis not present

## 2023-06-26 DIAGNOSIS — S81801A Unspecified open wound, right lower leg, initial encounter: Secondary | ICD-10-CM

## 2023-06-26 DIAGNOSIS — Z85828 Personal history of other malignant neoplasm of skin: Secondary | ICD-10-CM

## 2023-06-26 DIAGNOSIS — L905 Scar conditions and fibrosis of skin: Secondary | ICD-10-CM

## 2023-06-26 DIAGNOSIS — T1490XD Injury, unspecified, subsequent encounter: Secondary | ICD-10-CM

## 2023-06-26 DIAGNOSIS — C4492 Squamous cell carcinoma of skin, unspecified: Secondary | ICD-10-CM

## 2023-06-26 DIAGNOSIS — D4819 Other specified neoplasm of uncertain behavior of connective and other soft tissue: Secondary | ICD-10-CM | POA: Diagnosis not present

## 2023-06-26 NOTE — Progress Notes (Signed)
 Follow Up Visit   Subjective  Anna Marquez is a 73 y.o. female who presents for the following: follow up from Mohs surgery   The patient presents for follow up from Mohs surgery for a SCC on the right lower leg, treated on 05/27/23, repaired with second intention. The patient has been bandaging the wound as directed. The endorse the following concerns: concerned with healing   The following portions of the chart were reviewed this encounter and updated as appropriate: medications, allergies, medical history  Review of Systems:  No other skin or systemic complaints except as noted in HPI or Assessment and Plan.  Objective  Well appearing patient in no apparent distress; mood and affect are within normal limits.  A full examination was performed including scalp, head, face and right leg. All findings within normal limits unless otherwise noted below.  Healing wound with mild erythema  Relevant physical exam findings are noted in the Assessment and Plan.  Right Thigh - Anterior 7mm tender pink papule  Left Lower Leg - Anterior 5mm pink papule   Assessment & Plan   Healing wound  s/p Mohs for SCC, treated on 05/27/23, repaired with second intention - Reassured that wound is healing well - No evidence of infection - No swelling, induration, purulence, dehiscence, or tenderness out of proportion to the clinical exam, see photo above - Discussed that scars take up to 12 months to mature from the date of surgery - Recommend SPF 30+ to scar daily to prevent purple color from UV exposure during scar maturation process - Discussed that erythema and raised appearance of scar will fade over the next 4-6 months - Ok to continue ointment daily to wound under a bandage for another 3-4 weeks  HISTORY OF SQUAMOUS CELL CARCINOMA OF THE SKIN - No evidence of recurrence today - Recommend regular full body skin exams - Recommend daily broad spectrum sunscreen SPF 30+ to sun-exposed areas, reapply  every 2 hours as needed.  - Call if any new or changing lesions are noted between office visits  NEOPLASM OF UNCERTAIN BEHAVIOR OF SKIN (2) Right Thigh - Anterior Skin / nail biopsy Type of biopsy: tangential   Informed consent: discussed and consent obtained   Timeout: patient name, date of birth, surgical site, and procedure verified   Procedure prep:  Patient was prepped and draped in usual sterile fashion Prep type:  Isopropyl alcohol Anesthesia: the lesion was anesthetized in a standard fashion   Anesthetic:  1% lidocaine  w/ epinephrine  1-100,000 buffered w/ 8.4% NaHCO3 Instrument used: DermaBlade   Hemostasis achieved with: aluminum chloride   Outcome: patient tolerated procedure well   Post-procedure details: sterile dressing applied and wound care instructions given   Dressing type: petrolatum gauze and bandage   Specimen 1 - Surgical pathology Differential Diagnosis: r/o NMSC vs other  Check Margins: No Left Lower Leg - Anterior Skin / nail biopsy Type of biopsy: tangential   Informed consent: discussed and consent obtained   Timeout: patient name, date of birth, surgical site, and procedure verified   Procedure prep:  Patient was prepped and draped in usual sterile fashion Prep type:  Isopropyl alcohol Anesthesia: the lesion was anesthetized in a standard fashion   Anesthetic:  1% lidocaine  w/ epinephrine  1-100,000 buffered w/ 8.4% NaHCO3 Instrument used: DermaBlade   Hemostasis achieved with: aluminum chloride   Outcome: patient tolerated procedure well   Post-procedure details: sterile dressing applied and wound care instructions given   Dressing type: petrolatum gauze  and bandage   Specimen 2 - Surgical pathology Differential Diagnosis: r/o NMSC vs other  Check Margins: No SQUAMOUS CELL CARCINOMA OF SKIN   HEALING WOUND   SCAR      Return for based on biopsy results.  I, Haig Levan, Surg Tech III, am acting as scribe for Deneise Finlay, MD.    Documentation: I have reviewed the above documentation for accuracy and completeness, and I agree with the above.  Deneise Finlay, MD

## 2023-07-01 ENCOUNTER — Ambulatory Visit: Admitting: Behavioral Health

## 2023-07-01 LAB — SURGICAL PATHOLOGY

## 2023-07-02 ENCOUNTER — Ambulatory Visit: Payer: Self-pay | Admitting: Dermatology

## 2023-07-02 NOTE — Telephone Encounter (Signed)
-----   Message from [facility] PACI sent at 07/02/2023  9:23 AM EDT ----- Well Diff SCC- right thigh- WLE Atypical Squamous proliferation- cannot rule out SCC- Mohs since on left lower leg  Please call patient to discuss diagnosis and schedule for Mohs surgery and WLE ----- Message ----- From: Interface, [facility] Sent: 07/01/2023   6:54 PM EDT To: Rufus CHRISTELLA Holy, MD

## 2023-07-02 NOTE — Telephone Encounter (Signed)
 Patient aware of results and treatment plan. She currently has an appointment on 07/10/2023 to follow up on a previously treated spot. She would like to combine appointments if possible. I make her aware that we would be able to perform a follow up at a surgery appointment, but that a surgery would not be able to be performed at a follow up appointment. The schedulers will help consolidate appointments when they call.

## 2023-07-10 ENCOUNTER — Ambulatory Visit: Admitting: Dermatology

## 2023-07-16 DIAGNOSIS — B6013 Keratoconjunctivitis due to Acanthamoeba: Secondary | ICD-10-CM | POA: Diagnosis not present

## 2023-07-16 DIAGNOSIS — H179 Unspecified corneal scar and opacity: Secondary | ICD-10-CM | POA: Diagnosis not present

## 2023-07-23 ENCOUNTER — Encounter: Payer: Self-pay | Admitting: Dermatology

## 2023-07-24 ENCOUNTER — Ambulatory Visit: Admitting: Family Medicine

## 2023-07-28 ENCOUNTER — Ambulatory Visit

## 2023-07-28 ENCOUNTER — Ambulatory Visit (INDEPENDENT_AMBULATORY_CARE_PROVIDER_SITE_OTHER): Admitting: Family Medicine

## 2023-07-28 VITALS — BP 113/75 | HR 102 | Ht 64.0 in | Wt 181.0 lb

## 2023-07-28 DIAGNOSIS — E0590 Thyrotoxicosis, unspecified without thyrotoxic crisis or storm: Secondary | ICD-10-CM

## 2023-07-28 DIAGNOSIS — M8580 Other specified disorders of bone density and structure, unspecified site: Secondary | ICD-10-CM | POA: Diagnosis not present

## 2023-07-28 DIAGNOSIS — E041 Nontoxic single thyroid nodule: Secondary | ICD-10-CM

## 2023-07-28 DIAGNOSIS — B6013 Keratoconjunctivitis due to Acanthamoeba: Secondary | ICD-10-CM

## 2023-07-28 DIAGNOSIS — M545 Low back pain, unspecified: Secondary | ICD-10-CM | POA: Insufficient documentation

## 2023-07-28 DIAGNOSIS — M5126 Other intervertebral disc displacement, lumbar region: Secondary | ICD-10-CM | POA: Diagnosis not present

## 2023-07-28 DIAGNOSIS — M5136 Other intervertebral disc degeneration, lumbar region with discogenic back pain only: Secondary | ICD-10-CM | POA: Diagnosis not present

## 2023-07-28 DIAGNOSIS — I4819 Other persistent atrial fibrillation: Secondary | ICD-10-CM | POA: Diagnosis not present

## 2023-07-28 DIAGNOSIS — M47816 Spondylosis without myelopathy or radiculopathy, lumbar region: Secondary | ICD-10-CM | POA: Diagnosis not present

## 2023-07-28 DIAGNOSIS — I10 Essential (primary) hypertension: Secondary | ICD-10-CM

## 2023-07-28 NOTE — Assessment & Plan Note (Signed)
She has done well since ablation without any new episodes of A-fib.  Off of amiodarone at this point.  She does remain on metoprolol and anticoagulation with Eliquis.

## 2023-07-28 NOTE — Assessment & Plan Note (Signed)
 Methimazole  dose reduced to 5 mg daily by endocrinology.  She will continue management per their recommendations.  Does have nausea related to the biopsy.  Per cardiology she can hold eliquis  for 2 to 3 days prior to biopsy which is standard protocol with [facility].

## 2023-07-28 NOTE — Progress Notes (Signed)
 Anna Marquez - 73 y.o. female MRN [id number]  Date of birth: Aug 25, 1950  Subjective Chief Complaint  Patient presents with   Mood   Hypertension   Back Pain    HPI Anna Marquez is a 73 y.o. female here today for follow-up visit.  Still continues to grieve the loss of her daughter.   She continues to see psychologist for counseling.  She has found this to be helpful.  She would prefer if this was more in person.  Reports some low back pain.  Radiation to both lower extremities.  Denies numbness or tingling.  No bowel or bladder dysfunction.  Continues to see ophthalmology.  She has had resolution of the acanthamoeba infection continues to have inflammation of the eye.  She was on prednisone  daily which is being tapered.  She also remains on prednisolone  drops, chlorhexidine  drops and Valtrex drops.  Will likely need corneal transplant at some point but ophthalmologist would like to wait at least 1 year to be sure acanthamoeba has fully cleared.  She has not had any significant recurrence of A-fib since ablation.  She remains off of antiarrhythmics.  She does remain on Toprol .  Continues to see endocrinology for hyperthyroidism and remains on Tapazole .  She does have thyroid  nodule which has not been biopsied yet.  ROS:  A comprehensive ROS was completed and negative except as noted per HPI  Allergies  Allergen Reactions   Erythromycin      Upset stomach   Nsaids Other (See Comments)    Has a history of bleeding ulcers, tolerates aspirin     Penicillins Hives and Shortness Of Breath    Did it involve swelling of the face/tongue/throat, SOB, or low BP? Yes Did it involve sudden or severe rash/hives, skin peeling, or any reaction on the inside of your mouth or nose? No Did you need to seek medical attention at a hospital or doctor's office? No When did it last happen?      45 years If all above answers are "NO", may proceed with cephalosporin use.     Past Medical History:   Diagnosis Date   Adenomatous colon polyp 2003   Allergy    seasonal   Asthma    Cataract    Cholelithiasis    COVID    Diverticulosis of colon    Gastric polyps    GERD (gastroesophageal reflux disease)    Helicobacter pylori (H. pylori)    Hiatal hernia    Hypertension    Mitral valve prolapse    Nephrolithiasis    Osteoarthritis    Osteopenia 03/2017   T score -1.5 FRAX 8.9% / 0.9%.  Stable from prior DEXA 2016   Persistent atrial fibrillation (HCC)    Squamous cell carcinoma of skin 12/12/2022   Left lower leg anterior (KA type) - Mohs 01/06/2023   Squamous cell carcinoma of skin 01/06/2023   Left lower leg near ankle (KA type) - needs Mohs   Squamous cell carcinoma of skin 06/26/2023   Well differentiated. Right thigh- anterior (appt 08/21/2023)   TGA (transient global amnesia)    Thyroid  disease 06/09/21    Past Surgical History:  Procedure Laterality Date   ABDOMINAL HYSTERECTOMY     partial   ANTERIOR AND POSTERIOR REPAIR N/A 06/14/2014   Procedure: ANTERIOR (CYSTOCELE) AND POSTERIOR REPAIR (RECTOCELE);  Surgeon: Evalene SHAUNNA Organ, MD;  Location: [facility];  Service: Gynecology;  Laterality: N/A;   ATRIAL FIBRILLATION ABLATION N/A 02/11/2019   Procedure: ATRIAL FIBRILLATION ABLATION;  Surgeon: Kelsie Agent, MD;  Location: [facility] INVASIVE CV LAB;  Service: Cardiovascular;  Laterality: N/A;   ATRIAL FIBRILLATION ABLATION N/A 11/23/2021   Procedure: ATRIAL FIBRILLATION ABLATION;  Surgeon: Inocencio Soyla Lunger, MD;  Location: [facility] INVASIVE CV LAB;  Service: Cardiovascular;  Laterality: N/A;   CARDIOVERSION N/A 01/15/2019   Procedure: CARDIOVERSION;  Surgeon: Okey Vina GAILS, MD;  Location: [facility] ENDOSCOPY;  Service: Cardiovascular;  Laterality: N/A;   CARDIOVERSION N/A 07/09/2021   Procedure: CARDIOVERSION;  Surgeon: Rolan Ezra RAMAN, MD;  Location: [facility] ENDOSCOPY;  Service: Cardiovascular;  Laterality: N/A;   CHOLECYSTECTOMY     kidney stone removal     x2   VAGINAL HYSTERECTOMY  N/A 06/14/2014   Procedure: HYSTERECTOMY VAGINAL;  Surgeon: Evalene SHAUNNA Organ, MD;  Location: [facility];  Service: Gynecology;  Laterality: N/A;    Social History   Socioeconomic History   Marital status: Divorced    Spouse name: Not on file   Number of children: 2   Years of education: 12th grade   Highest education level: 12th grade  Occupational History   Occupation: Retired,  Tobacco Use   Smoking status: Never   Smokeless tobacco: Never   Tobacco comments:    Never smoke 07/16/21  Vaping Use   Vaping status: Never Used  Substance and Sexual Activity   Alcohol use: Never    Alcohol/week: 0.0 standard drinks of alcohol   Drug use: No   Sexual activity: Yes    Birth control/protection: Post-menopausal    Comment: 1st intercourse 73 yo-Fewer than 5 partners  Other Topics Concern   Not on file  Social History Narrative   Lives along with her dog. She enjoys croteching, crafts, making wreaths, reading and gardening in her free time.    Social Drivers of Corporate investment banker Strain: Low Risk  (07/28/2023)   Overall Financial Resource Strain (CARDIA)    Difficulty of Paying Living Expenses: Not very hard  Food Insecurity: No Food Insecurity (07/28/2023)   Hunger Vital Sign    Worried About Running Out of Food in the Last Year: Never true    Ran Out of Food in the Last Year: Never true  Transportation Needs: No Transportation Needs (07/28/2023)   PRAPARE - Administrator, Civil Service (Medical): No    Lack of Transportation (Non-Medical): No  Physical Activity: Insufficiently Active (07/28/2023)   Exercise Vital Sign    Days of Exercise per Week: 2 days    Minutes of Exercise per Session: 20 min  Stress: Stress Concern Present (07/28/2023)   [employer] of Occupational Health - Occupational Stress Questionnaire    Feeling of Stress: To some extent  Social Connections: Moderately Integrated (07/28/2023)   Social Connection and Isolation Panel     Frequency of Communication with Friends and Family: More than three times a week    Frequency of Social Gatherings with Friends and Family: More than three times a week    Attends Religious Services: More than 4 times per year    Active Member of [employer] or Organizations: Yes    Attends Engineer, structural: More than 4 times per year    Marital Status: Divorced    Family History  Problem Relation Age of Onset   Cancer Father        prostate   Alcohol abuse Father    Hearing loss Father    Breast cancer Paternal Aunt 45   Diabetes Maternal Grandmother    Heart disease  Maternal Grandmother    Hypertension Maternal Grandmother    Heart disease Maternal Grandfather    Melanoma Paternal Aunt    Neuropathy Mother    Congestive Heart Failure Mother    COPD Mother    Hearing loss Mother    Vision loss Mother    Arthritis Paternal Grandmother    Thyroid  disease Daughter    Heart disease Maternal Uncle    Kidney disease Brother     Health Maintenance  Topic Date Due   Zoster Vaccines- Shingrix (1 of 2) 02/02/1968   COVID-19 Vaccine (3 - Pfizer risk series) 04/21/2019   Medicare Annual Wellness (AWV)  03/27/2022   MAMMOGRAM  07/27/2022   Colonoscopy  03/13/2023   INFLUENZA VACCINE  08/08/2023   DTaP/Tdap/Td (3 - Tdap) 10/29/2027   Pneumococcal Vaccine: 50+ Years  Completed   DEXA SCAN  Completed   Hepatitis C Screening  Completed   Hepatitis B Vaccines  Aged Out   HPV VACCINES  Aged Out   Meningococcal B Vaccine  Aged Out     ----------------------------------------------------------------------------------------------------------------------------------------------------------------------------------------------------------------- Physical Exam BP 113/75 (BP Location: Left Arm, Patient Position: Sitting, Cuff Size: Large)   Pulse (!) 102   Ht 5' 4 (1.626 m)   Wt 181 lb (82.1 kg)   SpO2 96%   BMI 31.07 kg/m   Physical Exam Constitutional:      Appearance:  Normal appearance.  Eyes:     General: No scleral icterus. Cardiovascular:     Rate and Rhythm: Normal rate and regular rhythm.  Pulmonary:     Effort: Pulmonary effort is normal.     Breath sounds: Normal breath sounds.  Musculoskeletal:     Cervical back: Neck supple.  Neurological:     Mental Status: She is alert.  Psychiatric:        Mood and Affect: Mood normal.        Behavior: Behavior normal.     ------------------------------------------------------------------------------------------------------------------------------------------------------------------------------------------------------------------- Assessment and Plan  Essential hypertension Blood pressure well-controlled at this time.  She will continue current medications for management of her hypertension and cardiac disorders.  Persistent atrial fibrillation (HCC) She has done well since ablation without any new episodes of A-fib.  Off of amiodarone  at this point.  She does remain on metoprolol  and anticoagulation with Eliquis .  Hyperthyroidism Methimazole  dose reduced to 5 mg daily by endocrinology.  She will continue management per their recommendations.  Does have nausea related to the biopsy.  Per cardiology she can hold eliquis  for 2 to 3 days prior to biopsy which is standard protocol with NOAC.  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  Infection has cleared however she remains on prednisone  for her eye inflammation.  Acute bilateral low back pain without sciatica She is having some low back pain.  X-rays ordered of the lumbar spine.   No orders of the defined types were placed in this encounter.   Return in about 4 months (around 11/28/2023) for Hypertension.

## 2023-07-28 NOTE — Assessment & Plan Note (Addendum)
 She is having some low back pain.  X-rays ordered of the lumbar spine.

## 2023-07-28 NOTE — Assessment & Plan Note (Signed)
Blood pressure well-controlled at this time.  She will continue current medications for management of her hypertension and cardiac disorders.

## 2023-07-28 NOTE — Assessment & Plan Note (Signed)
 This is managed by [facility].  Infection has cleared however she remains on prednisone  for her eye inflammation.

## 2023-07-29 ENCOUNTER — Ambulatory Visit: Admitting: Dermatology

## 2023-07-29 ENCOUNTER — Encounter: Payer: Self-pay | Admitting: Dermatology

## 2023-07-29 VITALS — BP 124/74 | HR 96 | Temp 98.3°F

## 2023-07-29 DIAGNOSIS — T1490XD Injury, unspecified, subsequent encounter: Secondary | ICD-10-CM

## 2023-07-29 DIAGNOSIS — S81801A Unspecified open wound, right lower leg, initial encounter: Secondary | ICD-10-CM

## 2023-07-29 DIAGNOSIS — Z85828 Personal history of other malignant neoplasm of skin: Secondary | ICD-10-CM

## 2023-07-29 DIAGNOSIS — L929 Granulomatous disorder of the skin and subcutaneous tissue, unspecified: Secondary | ICD-10-CM

## 2023-07-29 DIAGNOSIS — C44729 Squamous cell carcinoma of skin of left lower limb, including hip: Secondary | ICD-10-CM

## 2023-07-29 DIAGNOSIS — L579 Skin changes due to chronic exposure to nonionizing radiation, unspecified: Secondary | ICD-10-CM | POA: Diagnosis not present

## 2023-07-29 DIAGNOSIS — L814 Other melanin hyperpigmentation: Secondary | ICD-10-CM

## 2023-07-29 DIAGNOSIS — L905 Scar conditions and fibrosis of skin: Secondary | ICD-10-CM

## 2023-07-29 DIAGNOSIS — C4492 Squamous cell carcinoma of skin, unspecified: Secondary | ICD-10-CM

## 2023-07-29 NOTE — Patient Instructions (Signed)

## 2023-07-29 NOTE — Progress Notes (Addendum)
 Follow-Up Visit   Subjective  Anna Marquez is a 73 y.o. female who presents for the following: Mohs of an Atypical Squamous Proliferation of the left lower leg-anterior, cannot rule out SCC.   She is s/p Mohs surgery for a SCC on the right lower leg, treated on 05/27/23, repaired with second intention. The patient has been bandaging the wound as directed. The endorse the following concerns: concerned with healing   The following portions of the chart were reviewed this encounter and updated as appropriate: medications, allergies, medical history  Review of Systems:  No other skin or systemic complaints except as noted in HPI or Assessment and Plan.  Objective  Well appearing patient in no apparent distress; mood and affect are within normal limits.  A focused examination was performed of the following areas: Left lower leg-anterior Relevant physical exam findings are noted in the Assessment and Plan.   Left Lower Leg - Anterior Healing biopsy site   Assessment & Plan   SQUAMOUS CELL CARCINOMA OF SKIN Left Lower Leg - Anterior Mohs surgery  Consent obtained: written  Anticoagulation: Is the patient taking prescription anticoagulant and/or aspirin  prescribed/recommended by a physician? Yes   Was the anticoagulation regimen changed prior to Mohs? No    Anesthesia: Anesthesia method: local infiltration Local anesthetic: lidocaine  1% WITH epi  Procedure Details: Timeout: pre-procedure verification complete Procedure Prep: patient was prepped and draped in usual sterile fashion Prep type: chlorhexidine  Biopsy accession number: [id number] Biopsy lab: [facility] Frozen section biopsy performed: No   Specimen debulked: No   Pre-Op diagnosis: squamous cell carcinoma MohsAIQ Surgical site (if tumor spans multiple areas, please select predominant area): lower limb (including hip) Surgery side: left Surgical site (from skin exam): Left Lower Leg - Anterior Pre-operative length  (cm): 0.8 Pre-operative width (cm): 0.7 Indications for Mohs surgery: anatomic location where tissue conservation is critical Previously treated? No    Micrographic Surgery Details: Post-operative length (cm): 1.7 Post-operative width (cm): 1.6 Number of Mohs stages: 1 Cumulative additional sections past 5 per stage: 0 Post surgery depth of defect: subcutaneous fat Is this a complex case (associate members only): No    Stage 1    Tumor features identified on Mohs section: no tumor identified    Depth of tumor invasion after stage: subcutaneous fat    Perineural invasion: no perineural invasion  Patient tolerance of procedure: tolerated well, no immediate complications  Reconstruction: Was the defect reconstructed?: No    Opioids: Did the patient receive a prescription for opioid/narcotic related to Mohs surgery? Yes   Indications for opioid/narcotics: patient required additional pain relief despite trial of non-opioid analgesia  Antibiotics: Does patient meet AHA guidelines for endocarditis?: No   Does patient meet AHA guidelines for orthopedic prophylaxis?: No   Were antibiotics given on the day of surgery? Yes   Did surgery breach mucosa, expose cartilage/bone, involve an area of lymphedema/inflamed/infected tissue? No   Indication for post-operative antibiotics: anatomic location  HEALING WOUND   SCAR   PROUD FLESH    Healing wound on the right lower leg s/p Mohs for SCC, treated on 05/27/23, repaired with second intention - Reassured that wound is healing well - No evidence of infection - No swelling, induration, purulence, dehiscence, or tenderness out of proportion to the clinical exam, see photo above - Discussed that scars take up to 12 months to mature from the date of surgery - Recommend SPF 30+ to scar daily to prevent purple color from UV exposure during  scar maturation process - Discussed that erythema and raised appearance of scar will fade over the next  4-6 months - Ok to continue ointment daily to wound under a bandage for another 3-4 weeks  Proud Flesh- Right lower leg The patient was evaluated for delayed wound healing with evidence of excessive granulation tissue, consistent with proud flesh. I explained that this is a benign but overactive healing response, where the granulation tissue extends beyond the wound edges, potentially interfering with proper re-epithelialization. We discussed that while this is not dangerous, it can delay closure and may require minor intervention. Treatment options include chemical cauterization with silver nitrate to reduce the excess tissue, as well as continued local wound care with non-adherent dressings and moisture balance. The importance of avoiding trauma or friction to the area was also reviewed. The patient was counseled on signs of infection and when to return for reassessment. All questions were addressed, and the patient expressed understanding of the diagnosis and plan. Silver nitrate was applied to the wound  HISTORY OF SQUAMOUS CELL CARCINOMA OF THE SKIN - No evidence of recurrence today - No lymphadenopathy - Recommend regular full body skin exams - Recommend daily broad spectrum sunscreen SPF 30+ to sun-exposed areas, reapply every 2 hours as needed.  - Call if any new or changing lesions are noted between office visits    Return in about 4 weeks (around 08/26/2023) for mohs follow up.  LILLETTE Berwyn Lesches, Surg Tech III, am acting as scribe for RUFUS CHRISTELLA HOLY, MD.    07/29/2023  HISTORY OF PRESENT ILLNESS  Anna Marquez is seen in consultation at the request of Dr. HOLY for biopsy-proven Atypical Squamous Proliferation, cannot rule out SCC. They note that the area has been present for about 2 months increasing in size with time.  There is no history of previous treatment.  Reports no other new or changing lesions and has no other complaints today.  Medications and allergies: see patient  chart.  Review of systems: Reviewed 8 systems and notable for the above skin cancer.  All other systems reviewed are unremarkable/negative, unless noted in the HPI. Past medical history, surgical history, family history, social history were also reviewed and are noted in the chart/questionnaire.    PHYSICAL EXAMINATION  General: Well-appearing, in no acute distress, alert and oriented x 4. Vitals reviewed in chart (if available).   Skin: Exam reveals a 0.8 x 0.7 cm erythematous papule and biopsy scar on the left lower leg. There are rhytids, telangiectasias, and lentigines, consistent with photodamage.  Biopsy report(s) reviewed, confirming the diagnosis.   ASSESSMENT  1) Atypical Squamous Proliferation of the left lower leg 2) photodamage 3) solar lentigines   PLAN   1. Due to location, size, histology, or recurrence and the likelihood of subclinical extension as well as the need to conserve normal surrounding tissue, the patient was deemed acceptable for Mohs micrographic surgery (MMS).  The nature and purpose of the procedure, associated benefits and risks including recurrence and scarring, possible complications such as pain, infection, and bleeding, and alternative methods of treatment if appropriate were discussed with the patient during consent. The lesion location was verified by the patient, by reviewing previous notes, pathology reports, and by photographs as well as angulation measurements if available.  Informed consent was reviewed and signed by the patient, and timeout was performed at 10:00 AM. See op note below.  2. For the photodamage and solar lentigines, sun protection discussed/information given on OTC sunscreens, and we  recommend continued regular follow-up with primary dermatologist every 6 months or sooner for any growing, bleeding, or changing lesions. 3. Prognosis and future surveillance discussed. 4. Letter with treatment outcome sent to referring provider. 5. Pain  acetaminophen /ibuprofen   MOHS MICROGRAPHIC SURGERY AND RECONSTRUCTION  Initial size:   0.8 x 0.7 cm Surgical defect/wound size: 1.7 x 1.6 cm Anesthesia:    0.33% lidocaine  with 1:200,000 epinephrine  EBL:    <5 mL Complications:  None Repair type:   Secondary Intention   Stages: 1  STAGE I: Anesthesia achieved with 0.5% lidocaine  with 1:200,000 epinephrine . ChloraPrep applied. 1 section(s) excised using Mohs technique (this includes total peripheral and deep tissue margin excision and evaluation with frozen sections, excised and interpreted by the same physician). The tumor was first debulked and then excised with an approx. 2mm margin.  Hemostasis was achieved with electrocautery as needed.  The specimen was then oriented, subdivided/relaxed, inked, and processed using Mohs technique.    Frozen section analysis revealed a clear deep and peripheral margin.   Reconstruction  Patient was notified of results and repair options were discussed, including second intention healing. After reviewing the advantages and disadvantages of each, we agreed on second intention healing as appropriate.   The surgical site was then lightly scrubbed with sterile, saline-soaked gauze.  The area was bandaged using Vaseline ointment, non-adherent gauze, gauze pads, and tape to provide an adequate pressure dressing.   The patient tolerated the procedure well, was given detailed written and verbal wound care instructions, and was discharged in good condition.  The patient will follow-up in 4 weeks and as scheduled with primary dermatologist.   Documentation: I have reviewed the above documentation for accuracy and completeness, and I agree with the above.  RUFUS CHRISTELLA HOLY, MD

## 2023-07-31 ENCOUNTER — Encounter: Payer: Self-pay | Admitting: Dermatology

## 2023-08-01 ENCOUNTER — Other Ambulatory Visit (HOSPITAL_COMMUNITY): Payer: Self-pay | Admitting: Family Medicine

## 2023-08-04 ENCOUNTER — Telehealth: Payer: Self-pay | Admitting: *Deleted

## 2023-08-04 NOTE — Telephone Encounter (Signed)
 Copied from CRM [id number]. Topic: Clinical - Lab/Test Results >> Aug 01, 2023  4:56 PM Graeme ORN wrote: Reason for CRM: Patient called to check on imaging results. Let her know have not yet been completed/reviewed by provider. Patient would like a call back with results once reviewed. Thank You

## 2023-08-07 ENCOUNTER — Ambulatory Visit: Payer: Self-pay | Admitting: Family Medicine
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
## 2023-08-07 NOTE — Progress Notes (Signed)
 Anna Marquez, I am covering for Dr. Alvia while he is out of the office.  I just wanted to let you know that your x-ray shows some arthritis and degenerative disc disease particularly at L2-3 and L3-4 and your low back.  There is also a little bit of shifting on the vertebrae at L5 which is at the very bottom of the lumbar spine.  Initial treatment for degenerative disc disease in the back is formal physical therapy it can make a did difference in mobility and pain reduction.  We have a great PT here in our building or if there is something closer to where you live am happy to place a referral.  If you are not improving after 4 to 6 weeks of PT then we would like to see you back.  Let us  know if we can place the PT referral for you?

## 2023-08-08 ENCOUNTER — Other Ambulatory Visit: Payer: Self-pay | Admitting: Family Medicine

## 2023-08-08 DIAGNOSIS — M545 Low back pain, unspecified: Secondary | ICD-10-CM

## 2023-08-08 NOTE — Progress Notes (Signed)
 HI [name], I will put the referral in today.  If you do not hear from them early next week please let me know.  They really do a fantastic job.

## 2023-08-13 DIAGNOSIS — B6013 Keratoconjunctivitis due to Acanthamoeba: Secondary | ICD-10-CM | POA: Diagnosis not present

## 2023-08-13 DIAGNOSIS — H25813 Combined forms of age-related cataract, bilateral: Secondary | ICD-10-CM | POA: Diagnosis not present

## 2023-08-13 DIAGNOSIS — H179 Unspecified corneal scar and opacity: Secondary | ICD-10-CM | POA: Diagnosis not present

## 2023-08-14 ENCOUNTER — Other Ambulatory Visit (HOSPITAL_COMMUNITY): Payer: Self-pay | Admitting: Cardiology

## 2023-08-19 ENCOUNTER — Ambulatory Visit: Admitting: Rehabilitative and Restorative Service Providers"

## 2023-08-20 ENCOUNTER — Ambulatory Visit: Admitting: Behavioral Health

## 2023-08-21 ENCOUNTER — Ambulatory Visit: Admitting: Dermatology

## 2023-08-27 ENCOUNTER — Other Ambulatory Visit: Payer: Self-pay | Admitting: Student

## 2023-08-27 ENCOUNTER — Other Ambulatory Visit: Payer: Self-pay | Admitting: Family Medicine

## 2023-08-27 DIAGNOSIS — F4321 Adjustment disorder with depressed mood: Secondary | ICD-10-CM

## 2023-08-28 DIAGNOSIS — H2589 Other age-related cataract: Secondary | ICD-10-CM | POA: Diagnosis not present

## 2023-08-28 DIAGNOSIS — H25811 Combined forms of age-related cataract, right eye: Secondary | ICD-10-CM | POA: Diagnosis not present

## 2023-08-29 DIAGNOSIS — Z961 Presence of intraocular lens: Secondary | ICD-10-CM | POA: Insufficient documentation

## 2023-09-01 ENCOUNTER — Ambulatory Visit: Payer: Self-pay

## 2023-09-01 NOTE — Telephone Encounter (Signed)
 FYI Only or Action Required?: FYI only for provider.  Patient was last seen in primary care on 07/28/2023 by Alvia Bring, DO.  Called Nurse Triage reporting Cough.  Symptoms began a week ago.  Interventions attempted: Nothing.  Symptoms are: gradually worsening.  Triage Disposition: No disposition on file.  Patient/caregiver understands and will follow disposition?:        Copied from CRM [id number]. Topic: Clinical - Red Word Triage >> Sep 01, 2023  9:36 AM Merlynn LABOR wrote: Red Word that prompted transfer to Nurse Triage: Dry cough/sore throat after surgery/low fever -99 Reason for Disposition  SEVERE coughing spells (e.g., whooping sound after coughing, vomiting after coughing)  Answer Assessment - Initial Assessment Questions 1. ONSET: When did the cough begin?      Last week 2. SEVERITY: How bad is the cough today?      Wednesday, has barking cough, states had cataract surgery on Thursday. 3. SPUTUM: Describe the color of your sputum (e.g., none, dry cough; clear, white, yellow, green)     denies 4. HEMOPTYSIS: Are you coughing up any blood? If Yes, ask: How much? (e.g., flecks, streaks, tablespoons, etc.)     denies 5. DIFFICULTY BREATHING: Are you having difficulty breathing? If Yes, ask: How bad is it? (e.g., mild, moderate, severe)      deneis 6. FEVER: Do you have a fever? If Yes, ask: What is your temperature, how was it measured, and when did it start?     61F 7. CARDIAC HISTORY: Do you have any history of heart disease? (e.g., heart attack, congestive heart failure)      afib 8. LUNG HISTORY: Do you have any history of lung disease?  (e.g., pulmonary embolus, asthma, emphysema)     asthma 9. PE RISK FACTORS: Do you have a history of blood clots? (or: recent major surgery, recent prolonged travel, bedridden)     denies 10. OTHER SYMPTOMS: Do you have any other symptoms? (e.g., runny nose, wheezing, chest pain)       Cough, sore throat,   11. PREGNANCY: Is there any chance you are pregnant? When was your last menstrual period?       na 12. TRAVEL: Have you traveled out of the country in the last month? (e.g., travel history, exposures)       no  Protocols used: Cough - Acute Productive-A-AH

## 2023-09-02 ENCOUNTER — Ambulatory Visit: Admitting: Family Medicine

## 2023-09-09 ENCOUNTER — Encounter: Payer: Self-pay | Admitting: Sports Medicine

## 2023-09-10 DIAGNOSIS — E042 Nontoxic multinodular goiter: Secondary | ICD-10-CM | POA: Diagnosis not present

## 2023-09-11 ENCOUNTER — Ambulatory Visit: Attending: Family Medicine

## 2023-09-11 DIAGNOSIS — D440 Neoplasm of uncertain behavior of thyroid gland: Secondary | ICD-10-CM | POA: Diagnosis not present

## 2023-09-11 DIAGNOSIS — M545 Low back pain, unspecified: Secondary | ICD-10-CM | POA: Diagnosis not present

## 2023-09-11 DIAGNOSIS — M5417 Radiculopathy, lumbosacral region: Secondary | ICD-10-CM | POA: Insufficient documentation

## 2023-09-11 DIAGNOSIS — M5459 Other low back pain: Secondary | ICD-10-CM | POA: Insufficient documentation

## 2023-09-11 NOTE — Therapy (Signed)
 OUTPATIENT PHYSICAL THERAPY THORACOLUMBAR EVALUATION   Patient Name: Anna Marquez MRN: [id number] DOB:January 15, 1950, 73 y.o., female Today's Date: 09/11/2023  END OF SESSION:  PT End of Session - 09/11/23 1053     Visit Number 1    Date for PT Re-Evaluation 12/04/23    Authorization Type HTA    PT Start Time 1055    PT Stop Time 1140    PT Time Calculation (min) 45 min          Past Medical History:  Diagnosis Date   Adenomatous colon polyp 2003   Allergy    seasonal   Asthma    Cataract    Cholelithiasis    COVID    Diverticulosis of colon    Gastric polyps    GERD (gastroesophageal reflux disease)    Helicobacter pylori (H. pylori)    Hiatal hernia    Hypertension    Mitral valve prolapse    Nephrolithiasis    Osteoarthritis    Osteopenia 03/2017   T score -1.5 FRAX 8.9% / 0.9%.  Stable from prior DEXA 2016   Persistent atrial fibrillation (HCC)    Squamous cell carcinoma of skin 12/12/2022   Left lower leg anterior (KA type) - Mohs 01/06/2023   Squamous cell carcinoma of skin 01/06/2023   Left lower leg near ankle (KA type) - needs Mohs   Squamous cell carcinoma of skin 06/26/2023   Well differentiated. Right thigh- anterior (appt 08/21/2023)   TGA (transient global amnesia)    Thyroid  disease 06/09/21   Past Surgical History:  Procedure Laterality Date   ABDOMINAL HYSTERECTOMY     partial   ANTERIOR AND POSTERIOR REPAIR N/A 06/14/2014   Procedure: ANTERIOR (CYSTOCELE) AND POSTERIOR REPAIR (RECTOCELE);  Surgeon: Evalene SHAUNNA Organ, MD;  Location: [facility];  Service: Gynecology;  Laterality: N/A;   ATRIAL FIBRILLATION ABLATION N/A 02/11/2019   Procedure: ATRIAL FIBRILLATION ABLATION;  Surgeon: Kelsie Agent, MD;  Location: [facility];  Service: Cardiovascular;  Laterality: N/A;   ATRIAL FIBRILLATION ABLATION N/A 11/23/2021   Procedure: ATRIAL FIBRILLATION ABLATION;  Surgeon: Inocencio Soyla Lunger, MD;  Location: [facility];  Service:  Cardiovascular;  Laterality: N/A;   CARDIOVERSION N/A 01/15/2019   Procedure: CARDIOVERSION;  Surgeon: Okey Vina GAILS, MD;  Location: [facility];  Service: Cardiovascular;  Laterality: N/A;   CARDIOVERSION N/A 07/09/2021   Procedure: CARDIOVERSION;  Surgeon: Rolan Ezra RAMAN, MD;  Location: [facility] ENDOSCOPY;  Service: Cardiovascular;  Laterality: N/A;   CHOLECYSTECTOMY     kidney stone removal     x2   VAGINAL HYSTERECTOMY N/A 06/14/2014   Procedure: HYSTERECTOMY VAGINAL;  Surgeon: Evalene SHAUNNA Organ, MD;  Location: [facility];  Service: Gynecology;  Laterality: N/A;   Patient Active Problem List   Diagnosis Date Noted   Acute bilateral low back pain without sciatica 07/28/2023   Grief at loss of child 12/02/2022   Grieving 11/22/2022   Neoplasm of uncertain behavior of skin 10/29/2022   Globus sensation 12/19/2021   Keratoconjunctivitis due to Acanthamoeba 09/16/2021   Multiple thyroid  nodules 09/12/2021   Secondary hypercoagulable state (HCC) 07/16/2021   Hyperglycemia 07/03/2021   Conjunctivitis 06/14/2021   Thyroid  nodule 06/08/2021   Hyperthyroidism 06/07/2021   Chronic diastolic heart failure (HCC) 06/07/2021   Sinobronchitis 02/01/2019   Seasonal allergic rhinitis due to pollen 09/25/2018   Reactive airway disease 09/25/2018   Hypomagnesemia 03/25/2018   Anemia 02/04/2018   Recurrent nephrolithiasis 02/04/2018   Screening for lipid disorders 10/28/2017  TIA (transient ischemic attack) 03/07/2016   Essential hypertension 01/27/2012   Persistent atrial fibrillation (HCC) 01/09/2012   Migraine without aura 05/20/2008   Allergic rhinitis 05/20/2008   Mitral regurgitation 08/10/2007   GERD 08/10/2007   Diaphragmatic hernia 08/10/2007   DIVERTICULOSIS, COLON 08/10/2007   Osteoarthritis 08/10/2007   History of colonic polyps 08/10/2007    PCP: Dorothyann Byars  REFERRING PROVIDER: Velma Ku  REFERRING DIAG:  M54.50 (ICD-10-CM) - Acute bilateral low back pain without  sciatica      Rationale for Evaluation and Treatment: Rehabilitation  THERAPY DIAG:  Radiculopathy, lumbosacral region  Other low back pain  ONSET DATE: 08/08/23  SUBJECTIVE:                                                                                                                                                                                           SUBJECTIVE STATEMENT: I went to the beach in June with my family. I was walking from the access to the beach and my back was hurting and I had to take breaks. I do have OA and I have had a bone density I have osteopenia. I think I am just getting old. I was walking a lot with my dog and doing Tai-Chi but then I had a-fib and was intubated for 4 days in 2023. So I haven't exercised much since then.   PERTINENT HISTORY:  See above  PAIN:  Are you having pain? Yes: NPRS scale: at worst 7-8  Pain location: low back below the waist and in the R leg  Pain description: dull, ache  Aggravating factors: standing to cook or wash dishes, taking a shower Relieving factors: leaning against something, bending forward  PRECAUTIONS: None  RED FLAGS: None   WEIGHT BEARING RESTRICTIONS: No  FALLS:  Has patient fallen in last 6 months? Yes. Number of falls just one--I fell on Tuesday, on my R side while walking my dog and he pulled me   LIVING ENVIRONMENT: Lives with: lives alone Lives in: House/apartment Stairs: Yes: External: 3 steps; on right going up  OCCUPATION: Retired  PLOF: Independent, Independent with basic ADLs, and Independent with gait  PATIENT GOALS: strengthen my back so I can play with my grandson like swimming, walking, bike riding, present myself from falling and help pain subside   NEXT MD VISIT:   OBJECTIVE:  Note: Objective measures were completed at Evaluation unless otherwise noted.  DIAGNOSTIC FINDINGS:  FINDINGS: Osseous structures are osteopenic. No compression deformities. Grade 1 L5 retrolisthesis.  Mild degenerative disc disease L2-3 and L3-4 with small marginal osteophytes. No osteolytic or osteoblastic lesions.   IMPRESSION: Osteopenia.  Degenerative changes. Grade 1 L5 retrolisthesis.  COGNITION: Overall cognitive status: Within functional limits for tasks assessed     SENSATION: WFL  POSTURE: rounded shoulders and flexed trunk    LUMBAR ROM:   AROM eval  Flexion WFL  Extension 50%  Right lateral flexion WFL  Left lateral flexion 80% some pain  Right rotation WFL  Left rotation 80% some tightness   (Blank rows = not tested)  LOWER EXTREMITY ROM:   WFL   LOWER EXTREMITY MMT:  5/5 BLE   LUMBAR SPECIAL TESTS:  Straight leg raise test: Negative  FUNCTIONAL TESTS:  5 times sit to stand: 21s pain in knees  Timed up and go (TUG): 11s  GAIT: Distance walked: in clinic distances Assistive device utilized: None Level of assistance: Complete Independence   TREATMENT DATE: 09/11/23- EVAL, HEP    PATIENT EDUCATION:  Education details: HEP, POC, Person educated: Patient Education method: Explanation Education comprehension: verbalized understanding and returned demonstration  HOME EXERCISE PROGRAM: Access Code: [email or web address] URL: [email or web address] Date: 09/11/2023 Prepared by: Almetta Fam  Exercises - Supine Lower Trunk Rotation  - 1 x daily - 7 x weekly - 2 sets - 10 reps - Supine Bridge  - 1 x daily - 7 x weekly - 2 sets - 10 reps - Supine Single Knee to Chest Stretch  - 1 x daily - 7 x weekly - 2 reps - 30 hold - Sit to Stand  - 1 x daily - 7 x weekly - 2 sets - 10 reps - Doorway Pec Stretch at 90 Degrees Abduction  - 1 x daily - 7 x weekly - 2 reps - 30 hold  ASSESSMENT:  CLINICAL IMPRESSION: Patient is a 73 y.o. female who was seen today for physical therapy evaluation and treatment for chronic LBP. She does have some visual issues with the L eye and had cataract surgery on the R eye 2 weeks ago. Patient had a fall 2 days ago and reports  some soreness in her R knee. Her back pain is not consistent and comes and goes. Some days she can mow and weed eat her yard, do her dishes, vacuum and mop without issues but some days she can hardly do any of these things. Patient reports standing and walking for long periods cause her pain. She does have a flexion preference and reports if she leans over something or against the wall it can relieve her pain. Her posture is somewhat kyphotic and she is flexed forward when she walks. She will benefit from skilled PT to address her pain and postural impairments by strengthening back, core, and LE's to provide some relief and stability and increase her tolerance to activity.   OBJECTIVE IMPAIRMENTS: decreased activity tolerance, difficulty walking, decreased ROM, postural dysfunction, and pain.   ACTIVITY LIMITATIONS: carrying, lifting, stairs, and locomotion level  PARTICIPATION LIMITATIONS: meal prep, cleaning, laundry, shopping, community activity, and yard work  PERSONAL FACTORS: Age, Past/current experiences, Time since onset of injury/illness/exacerbation, and 1-2 comorbidities: hx of cancer, heart disease, vision impairments are also affecting patient's functional outcome.   REHAB POTENTIAL: Good  CLINICAL DECISION MAKING: Stable/uncomplicated  EVALUATION COMPLEXITY: Low   GOALS: Goals reviewed with patient? Yes  SHORT TERM GOALS: Target date: 10/23/23  Patient will be independent with initial HEP.  Baseline:  Goal status: INITIAL  2.  Patient will complete 5xSTS <15s no pain Baseline: 21s w/pain in knees Goal status: INITIAL   LONG TERM GOALS: Target date: 12/04/23  Patient will be  independent with advanced/ongoing HEP to improve outcomes and carryover.  Baseline:  Goal status: INITIAL  2.  Patient will report 50-75% improvement in low back pain to improve QOL.  Baseline: 7-8 at worst  Goal status: INITIAL  3.  Patient will demonstrate full pain free lumbar ROM to  perform ADLs.   Baseline:  Goal status: INITIAL  4.  Patient will tolerate 30 min of standing and walking to perform household chores and play with her grandson without pain.  Baseline:  Goal status: INITIAL  5.  Patient to demonstrate ability to achieve and maintain good spinal alignment/posturing and body mechanics needed for daily activities. Baseline: flexed posture and kyphotic Goal status: INITIAL  PLAN:  PT FREQUENCY: 1-2x/week  PT DURATION: 12 weeks  PLANNED INTERVENTIONS: 97110-Therapeutic exercises, 97530- Therapeutic activity, 97112- Neuromuscular re-education, 97535- Self Care, [id number]- Manual therapy, [id number]- Electrical stimulation (unattended), 20560 (1-2 muscles), 20561 (3+ muscles)- Dry Needling, Patient/Family education, Balance training, Stair training, Joint mobilization, Cryotherapy, and Moist heat.  PLAN FOR NEXT SESSION: low back stretching and strengthening, core strengthening, postural strengthening    Almetta Fam, PT 09/11/2023, 12:01 PM

## 2023-09-19 ENCOUNTER — Telehealth: Payer: Self-pay | Admitting: Cardiology

## 2023-09-19 NOTE — Telephone Encounter (Signed)
 Called to confirm/remind patient of their appointment at the [facility] on 09/22/23.   Appointment:   [] Confirmed  [x] Left mess   [] No answer/No voice mail  [] VM Full/unable to leave message  [] Phone not in service  Patient reminded to bring all medications and/or complete list.  Confirmed patient has transportation. Gave directions, instructed to utilize valet parking.

## 2023-09-22 ENCOUNTER — Other Ambulatory Visit
Admission: RE | Admit: 2023-09-22 | Discharge: 2023-09-22 | Disposition: A | Discharge status: Home / Self Care | Source: Ambulatory Visit | Attending: Cardiology | Admitting: Cardiology

## 2023-09-22 ENCOUNTER — Ambulatory Visit (HOSPITAL_COMMUNITY): Payer: Self-pay | Admitting: Cardiology

## 2023-09-22 ENCOUNTER — Ambulatory Visit (HOSPITAL_BASED_OUTPATIENT_CLINIC_OR_DEPARTMENT_OTHER): Admitting: Cardiology

## 2023-09-22 VITALS — BP 110/80 | HR 89 | Wt 181.0 lb

## 2023-09-22 DIAGNOSIS — I2510 Atherosclerotic heart disease of native coronary artery without angina pectoris: Secondary | ICD-10-CM

## 2023-09-22 DIAGNOSIS — I4819 Other persistent atrial fibrillation: Secondary | ICD-10-CM | POA: Diagnosis not present

## 2023-09-22 DIAGNOSIS — I5032 Chronic diastolic (congestive) heart failure: Secondary | ICD-10-CM | POA: Diagnosis not present

## 2023-09-22 LAB — CBC
HCT: 36.2 % (ref 36.0–46.0)
Hemoglobin: 11.9 g/dL — ABNORMAL LOW (ref 12.0–15.0)
MCH: 28.6 pg (ref 26.0–34.0)
MCHC: 32.9 g/dL (ref 30.0–36.0)
MCV: 87 fL (ref 80.0–100.0)
Platelets: 204 K/uL (ref 150–400)
RBC: 4.16 MIL/uL (ref 3.87–5.11)
RDW: 13.5 % (ref 11.5–15.5)
WBC: 5.8 K/uL (ref 4.0–10.5)
nRBC: 0 % (ref 0.0–0.2)

## 2023-09-22 LAB — LIPID PANEL
Cholesterol: 105 mg/dL (ref 0–200)
HDL: 52 mg/dL (ref >40)
LDL Cholesterol: 24 mg/dL (ref 0–99)
Total CHOL/HDL Ratio: 2 ratio
Triglycerides: 144 mg/dL (ref <150)
VLDL: 29 mg/dL (ref 0–40)

## 2023-09-22 LAB — BASIC METABOLIC PANEL WITH GFR
Anion gap: 15 (ref 5–15)
BUN: 6 mg/dL — ABNORMAL LOW (ref 8–23)
CO2: 24 mmol/L (ref 22–32)
Calcium: 9.3 mg/dL (ref 8.9–10.3)
Chloride: 105 mmol/L (ref 98–111)
Creatinine, Ser: 1.01 mg/dL — ABNORMAL HIGH (ref 0.44–1.00)
GFR, Estimated: 58 mL/min — ABNORMAL LOW (ref >60)
Glucose, Bld: 98 mg/dL (ref 70–99)
Potassium: 3.2 mmol/L — ABNORMAL LOW (ref 3.5–5.1)
Sodium: 144 mmol/L (ref 135–145)

## 2023-09-22 LAB — BRAIN NATRIURETIC PEPTIDE: B Natriuretic Peptide: 56.5 pg/mL (ref 0.0–100.0)

## 2023-09-22 NOTE — Patient Instructions (Signed)
 Medication Changes:  No medication changes today!  Lab Work:  Go over to the [facility]. Go pass the gift shop and have your blood work completed.  We will only call you if the results are abnormal or if the provider would like to make medication changes.  No news is good news.   Follow-Up in: Please follow up with the [facility] in 6 months with Dr. Rolan. We do not currently have that schedule. Please give us  a call in February in order to schedule your appointment for March 2026.   Thank you for choosing [facility].    At the [facility], you and your health needs are our priority. We have a designated team specialized in the treatment of Heart Failure. This Care Team includes your primary Heart Failure Specialized Cardiologist (physician), Advanced Practice Providers (APPs- Physician Assistants and Nurse Practitioners), and Pharmacist who all work together to provide you with the care you need, when you need it.   You may see any of the following providers on your designated Care Team at your next follow up:  Dr. Toribio Fuel Dr. Ezra Rolan Dr. Ria Commander Dr. Morene Brownie Ellouise Class, FNP Jaun Bash, RPH-CPP  Please be sure to bring in all your medications bottles to every appointment.   Need to Contact Us :  If you have any questions or concerns before your next appointment please send us  a message through [place] or call our office at [phone].    TO LEAVE A MESSAGE FOR THE NURSE SELECT OPTION 2, PLEASE LEAVE A MESSAGE INCLUDING: YOUR NAME DATE OF BIRTH CALL BACK NUMBER REASON FOR CALL**this is important as we prioritize the call backs  YOU WILL RECEIVE A CALL BACK THE SAME DAY AS LONG AS YOU CALL BEFORE 4:00 PM

## 2023-09-22 NOTE — Progress Notes (Signed)
 ADVANCED HF CLINIC NOTE  Primary Care: Alvia Bring, DO HF Cardiologist: Dr. Rolan  Chief complaint: H/o atrial fibrillation  HPI: Anna Marquez is a 73 y.o.with history of diastolic CHF, paroxysmal atrial fibrillation, and hyperthyroidism.  She was was admitted with atrial fibrillation/RVR and acute on chronic diastolic CHF. Patient was on Tikosyn  several years ago for AF then had ablation in 2/21 by Dr. Kelsie.  Tikosyn  was later stopped.  Recently, she has re-developed episodes of atrial fibrillation.  She also, of noted, has been found to have hyperthyroidism likely related to multinodular goiter.  She was admitted in 5/23 with acute respiratory failure in setting of atrial fibrillation and CHF, there was some question of PNA.  She spontaneously converted to NSR.  At office visit subsequently on 06/19/21, she remained in NSR.     She was admitted  07/03/21 with atrial fibrillation with RVR and developed progressive respiratory distress. Amiodarone  gtt was started and she was given Lasix , but ultimately required intubation. She developed hypotension and required NE + vasopressin  with elevated lactate, and AKI. TEE showed normal LV size with EF 60-65%, mild RV dysfunction, severe LAE with moderate-severe central MR (likely atrial functional MR), no LA appendage thrombus. DCCV was attempted x 2, both times she went back into NSR but promptly reverted to AF. Eventual successful DCCV to NSR on 07/09/21. Drips weaned off, and started on oral amiodarone  taper. GDMT started and she was discharged home, weight 162 lbs.  Follow up in AF clinic 07/16/21, HR 123 in Afib. Felt AF paroxysmal and needed more time for amiodarone  load. Toprol  started.   In 11/23, she had atrial fibrillation ablation.  Amiodarone  was stopped in 2/24.   Echo in 3/24 showed EF 55-60%, normal RV, mild MR.  Echo in 4/25 showed EF 55-60%, normal RV, mild MR, IVC normal.   Today she returns for HF follow up. She is doing well from  a cardiac standpoint.  Has some seasonal allergies (gets in fall and spring) and taking anti-histamine.  Occasionally need albuterol .  Not wheezing currently.  No exertional dyspnea or chest pain.  She follows her heart rhythm on her Apple Watch, last run of AF was in 4/25 and lasted for about 2 days.  None since.  No palpitations, lightheadedness.  Weight up about 4 lbs.   ECG (personally reviewed): NSR, nonspecific changes.   Labs (3/24): LDL 44 Labs (8/24): hgb 13, K 3.6, creatinine 1.3 Labs (4/25): K 3.6, creatinine 0.96  PMH: 1. Cholelithiasis 2. Diverticulosis 3. H/o COVID-19 4. Hyperthyroidism: Amiodarone  + multinodular goiter 5. Atrial fibrillation: Paroxysmal. - Atrial fibrillation ablation 2/21.  - Atrial fibrillation ablation in 11/23.  6. Chronic diastolic CHF:  TEE (6/23) with normal LV size with EF 60-65%, mild RV dysfunction, severe LAE with moderate-severe central MR (likely atrial functional MR), no LA appendage thrombus. - Echo (3/24): EF 55-60%, normal RV, mild MR.  - Echo (4/25): EF 55-60%, normal RV, mild MR, IVC normal. 7. CAD: CTA for pulmonary veins showed calcium  score 134, 72nd percentile.  8. Acanthamoeba keratitis   Current Outpatient Medications  Medication Sig Dispense Refill   acetaminophen  (TYLENOL ) 500 MG tablet Take 1,000 mg by mouth every 6 (six) hours as needed for moderate pain or headache.     albuterol  (VENTOLIN  HFA) 108 (90 Base) MCG/ACT inhaler INHALE 1 TO 2 PUFFS INTO THE LUNGS EVERY 6 HOURS AS NEEDED FOR WHEEZING OR SHORTNESS OF BREATH. 8.5 g 4   ALPRAZolam  (XANAX ) 0.5 MG tablet TAKE  1 TO 2 TABLETS BY MOUTH 2 TIMES DAILY AS NEEDED FOR ANXIETY OR SLEEP. 30 tablet 2   apixaban  (ELIQUIS ) 5 MG TABS tablet TAKE 1 TABLET BY MOUTH 2 TIMES DAILY. 180 tablet 3   Ascorbic Acid (VITAMIN C) 1000 MG tablet Take 1,000 mg by mouth daily.     atropine 1 % ophthalmic solution Place 1 drop into the left eye daily. (Patient taking differently: Place 1 drop into  the left eye daily as needed.)     Cholecalciferol  (VITAMIN D ) 50 MCG (2000 UT) tablet Take 2,000 Units by mouth daily.     FARXIGA  10 MG TABS tablet TAKE 1 TABLET (10 MG TOTAL) BY MOUTH DAILY. 30 tablet 5   ferrous sulfate 325 (65 FE) MG tablet Take 325 mg by mouth every other day.     fluticasone  (FLONASE ) 50 MCG/ACT nasal spray PLACE 1 SPRAY INTO BOTH NOSTRILS AS NEEDED FOR ALLERGIES OR RHINITIS. 16 g 7   furosemide  (LASIX ) 20 MG tablet TAKE 1 TABLET (20 MG TOTAL) BY MOUTH AS NEEDED FOR EDEMA OR FLUID. 90 tablet 4   gabapentin (NEURONTIN) 100 MG capsule Take 100 mg by mouth 3 (three) times daily.     loratadine  (CLARITIN ) 10 MG tablet Take 10 mg by mouth daily.      methimazole  (TAPAZOLE ) 5 MG tablet Take 5 mg by mouth daily.     metoprolol  succinate (TOPROL -XL) 100 MG 24 hr tablet TAKE 1 TABLET (100 MG TOTAL) BY MOUTH DAILY. TAKE WITH OR IMMEDIATELY FOLLOWING A MEAL. 90 tablet 3   NONFORMULARY OR COMPOUNDED ITEM Apply 1 drop to eye.     pantoprazole  (PROTONIX ) 40 MG tablet TAKE 1 TABLET (40 MG TOTAL) BY MOUTH DAILY. 90 tablet 5   potassium chloride  (KLOR-CON ) 10 MEQ tablet TAKE 1 TABLET BY MOUTH DAILY AS NEEDED (TAKE WITH HCTZ). 90 tablet 3   Probiotic Product (PROBIOTIC PO) Take 1 capsule by mouth daily.     rosuvastatin  (CRESTOR ) 10 MG tablet TAKE 1 TABLET (10 MG TOTAL) BY MOUTH DAILY. 90 tablet 1   spironolactone  (ALDACTONE ) 25 MG tablet TAKE 1/2 TABLET (12.5 MG TOTAL) BY MOUTH DAILY. 15 tablet 11   valACYclovir (VALTREX) 1000 MG tablet Take 1,000 mg by mouth every morning.     vitamin E 180 MG (400 UNITS) capsule Take 400 Units by mouth daily.     ZINC CITRATE PO Take 22 mg by mouth daily.     Carboxymethylcellulose Sodium (EYE DROPS OP) Place 1 drop in the left eye every other hour- CHLORHEXIDINE , PRESERVED, 0.02 % OPHTHALMIC SOLUTION (Patient not taking: Reported on 09/22/2023)     doxycycline  (MONODOX ) 50 MG capsule Take 50 mg by mouth daily. (Patient not taking: Reported on 09/22/2023)      IMPAVIDO 50 MG CAPS Take 1 capsule by mouth 2 (two) times daily. (Patient not taking: Reported on 09/22/2023)     moxifloxacin  (VIGAMOX ) 0.5 % ophthalmic solution Place 1 drop into the left eye 6 (six) times daily. (Patient not taking: Reported on 09/22/2023)     trifluridine (VIROPTIC) 1 % ophthalmic solution Place 1 drop into the left eye 5 (five) times daily. (Patient not taking: Reported on 09/22/2023)     trimethoprim -polymyxin b (POLYTRIM) ophthalmic solution Place 1 drop into the left eye. Patient does 6 times  a day. (Patient not taking: Reported on 09/22/2023)     No current facility-administered medications for this visit.   Allergies  Allergen Reactions   Erythromycin      Upset  stomach   Nsaids Other (See Comments)    Has a history of bleeding ulcers, tolerates aspirin     Penicillins Hives and Shortness Of Breath    Did it involve swelling of the face/tongue/throat, SOB, or low BP? Yes Did it involve sudden or severe rash/hives, skin peeling, or any reaction on the inside of your mouth or nose? No Did you need to seek medical attention at a hospital or doctor's office? No When did it last happen?      45 years If all above answers are "NO", may proceed with cephalosporin use.    Social History   Socioeconomic History   Marital status: Divorced    Spouse name: Not on file   Number of children: 2   Years of education: 12th grade   Highest education level: 12th grade  Occupational History   Occupation: Retired,  Tobacco Use   Smoking status: Never   Smokeless tobacco: Never   Tobacco comments:    Never smoke 07/16/21  Vaping Use   Vaping status: Never Used  Substance and Sexual Activity   Alcohol use: Never    Alcohol/week: 0.0 standard drinks of alcohol   Drug use: No   Sexual activity: Yes    Birth control/protection: Post-menopausal    Comment: 1st intercourse 73 yo-Fewer than 5 partners  Other Topics Concern   Not on file  Social History Narrative   Lives  along with her dog. She enjoys croteching, crafts, making wreaths, reading and gardening in her free time.    Social Drivers of Corporate investment banker Strain: Low Risk  (07/28/2023)   Overall Financial Resource Strain (CARDIA)    Difficulty of Paying Living Expenses: Not very hard  Food Insecurity: No Food Insecurity (07/28/2023)   Hunger Vital Sign    Worried About Running Out of Food in the Last Year: Never true    Ran Out of Food in the Last Year: Never true  Transportation Needs: No Transportation Needs (07/28/2023)   PRAPARE - Administrator, Civil Service (Medical): No    Lack of Transportation (Non-Medical): No  Physical Activity: Insufficiently Active (07/28/2023)   Exercise Vital Sign    Days of Exercise per Week: 2 days    Minutes of Exercise per Session: 20 min  Stress: Stress Concern Present (07/28/2023)   [employer] of Occupational Health - Occupational Stress Questionnaire    Feeling of Stress: To some extent  Social Connections: Moderately Integrated (07/28/2023)   Social Connection and Isolation Panel    Frequency of Communication with Friends and Family: More than three times a week    Frequency of Social Gatherings with Friends and Family: More than three times a week    Attends Religious Services: More than 4 times per year    Active Member of [employer] or Organizations: Yes    Attends Engineer, structural: More than 4 times per year    Marital Status: Divorced  Intimate Partner Violence: Not At Risk (12/17/2022)   Received from [facility]   HITS    Over the last 12 months how often did your partner physically hurt you?: Never    Over the last 12 months how often did your partner insult you or talk down to you?: Never    Over the last 12 months how often did your partner threaten you with physical harm?: Never    Over the last 12 months how often did your partner scream or curse at you?:  Never   Family History  Problem Relation Age of  Onset   Cancer Father        prostate   Alcohol abuse Father    Hearing loss Father    Breast cancer Paternal Aunt 30   Diabetes Maternal Grandmother    Heart disease Maternal Grandmother    Hypertension Maternal Grandmother    Heart disease Maternal Grandfather    Melanoma Paternal Aunt    Neuropathy Mother    Congestive Heart Failure Mother    COPD Mother    Hearing loss Mother    Vision loss Mother    Arthritis Paternal Grandmother    Thyroid  disease Daughter    Heart disease Maternal Uncle    Kidney disease Brother    BP 110/80   Pulse 89   Wt 181 lb (82.1 kg)   SpO2 97%   BMI 31.07 kg/m   Wt Readings from Last 3 Encounters:  09/22/23 181 lb (82.1 kg)  07/28/23 181 lb (82.1 kg)  04/18/23 172 lb (78 kg)   PHYSICAL EXAM: General: NAD Neck: No JVD, no thyromegaly or thyroid  nodule.  Lungs: Clear to auscultation bilaterally with normal respiratory effort. CV: Nondisplaced PMI.  Heart regular S1/S2, no S3/S4, no murmur.  No peripheral edema.  No carotid bruit.  Normal pedal pulses.  Abdomen: Soft, nontender, no hepatosplenomegaly, no distention.  Skin: Intact without lesions or rashes.  Neurologic: Alert and oriented x 3.  Psych: Normal affect. Extremities: No clubbing or cyanosis.  HEENT: Normal.   ASSESSMENT & PLAN: 1. Chronic diastolic CHF: TEE in 6/23 showed normal LV size with EF 60-65%, mild RV dysfunction, severe LAE with moderate-severe central MR (likely atrial functional MR). Suspect CHF exacerbation in 6/23 was triggered by atrial fibrillation, also mitral regurgitation may have been worsened by atrial fibrillation.  She had a prior admission with CHF in the setting of atrial fibrillation, consistent with flash pulmonary edema. Echo in 3/24 showed  EF 55-60%, normal RV, mild MR.  Echo in 4/25 also showed EF 55-60%, normal RV, mild MR, IVC normal. She is not volume overloaded on exam, NYHA class I.  Need to keep her in NSR as AF appears to precipitate CHF.  -  Continue spironolactone  12.5 mg daily. BMET today - Continue Toprol  XL 100 mg daily.  - Continue Farxiga  10 mg daily.  - Ischemia as a contributor to flash pulmonary edema is possible though less likely. She has not been cathed.  2. Atrial fibrillation: Persistent.  She was on Tikosyn  in the past then had AF ablation in 2/21, Tikosyn  stopped after this.  She was not on an antiarrhythmic prior to admission.   She was admitted in 5/23 with AF and CHF exacerbation. She has also been noted to have developed hyperthyroidism prior to this admission, this can help trigger AF. Need to keep in NSR due to worsening of HF in AF.  DCCV 07/05/21 failed, but she was still on low dose norepinephrine . DCCV 07/09/21 successful. Amiodarone  is not an ideal medication for her (h/o hyperthyroidism). Would like to avoid this long-term. She had re-do atrial fibrillation ablation with Dr. Inocencio 11/23/21. She is in NSR today and amiodarone  has been stopped. - Continue Eliquis  5 mg bid. CBC today.  3. Hyperthyroidism: She had this prior to 5/23 admission (prior to amiodarone  use).  Has multinodular goiter. She sees endocrinology. Now off amiodarone .  - Continue methimazole , endocrinology following and tapering down.  4. CAD: borderline calcium  score, 72nd percentile.  -  Continue Crestor , check lipids today.    Follow up 6 months with me  I spent 21 minutes reviewing records, interviewing/examining patient, and managing orders.   Ezra Shuck  09/22/2023

## 2023-09-24 ENCOUNTER — Ambulatory Visit: Admitting: Physical Therapy

## 2023-09-25 DIAGNOSIS — E042 Nontoxic multinodular goiter: Secondary | ICD-10-CM | POA: Diagnosis not present

## 2023-09-29 ENCOUNTER — Encounter: Payer: Self-pay | Admitting: Dermatology

## 2023-10-01 ENCOUNTER — Telehealth: Payer: Self-pay

## 2023-10-01 DIAGNOSIS — E041 Nontoxic single thyroid nodule: Secondary | ICD-10-CM

## 2023-10-01 NOTE — Telephone Encounter (Signed)
 Copied from CRM #[id number]. Topic: Clinical - Medical Advice >> Oct 01, 2023  4:00 PM Tobias L wrote: Reason for CRM: Patient had a biopsy done by endocrinologist (Dr. Beryl) and results came back and showed that there is 75% chance of cancer. Endocrinologist recommended two surgeons:   Dr. Carlie at [facility] with [facility]  with [facility]- Dr. Krystal Spinner  Patient states Dr. Karoline office should be sending records to Dr. Alvia.   Patient inquiring which surgeon Dr. Alvia would recommend for thyroid  cancer removal. Endocrinologist advised to remove everything. Patient inquiring what Dr. Estil opinion is on Dr. Krystal Spinner and if Dr. Alvia would recommend him or not, patient would prefer to see Dr. Rito as she is familiar with [facility].  Requesting callback: [phone] or okay with mychart message.

## 2023-10-02 ENCOUNTER — Encounter: Payer: Self-pay | Admitting: Dermatology

## 2023-10-02 ENCOUNTER — Ambulatory Visit: Admitting: Dermatology

## 2023-10-02 ENCOUNTER — Ambulatory Visit (INDEPENDENT_AMBULATORY_CARE_PROVIDER_SITE_OTHER): Admitting: Dermatology

## 2023-10-02 VITALS — BP 108/67 | HR 93

## 2023-10-02 DIAGNOSIS — D0471 Carcinoma in situ of skin of right lower limb, including hip: Secondary | ICD-10-CM | POA: Diagnosis not present

## 2023-10-02 DIAGNOSIS — L988 Other specified disorders of the skin and subcutaneous tissue: Secondary | ICD-10-CM | POA: Diagnosis not present

## 2023-10-02 DIAGNOSIS — C4492 Squamous cell carcinoma of skin, unspecified: Secondary | ICD-10-CM

## 2023-10-02 NOTE — Progress Notes (Unsigned)
   Follow-Up Visit   Subjective  Anna Marquez is a 73 y.o. female who presents for the following: Excision of right thigh anterior  The following portions of the chart were reviewed this encounter and updated as appropriate: medications, allergies, medical history  Review of Systems:  No other skin or systemic complaints except as noted in HPI or Assessment and Plan.  Objective  Well appearing patient in no apparent distress; mood and affect are within normal limits.  A focused examination was performed of the following areas: Right thigh anterior Relevant physical exam findings are noted in the Assessment and Plan.     Assessment & Plan   SQUAMOUS CELL CARCINOMA OF SKIN Right Thigh - Anterior Skin excision  Excision method:  elliptical Lesion length (cm):  1.1 Lesion width (cm):  1 Margin per side (cm):  0.5 Total excision diameter (cm):  2.1 Informed consent: discussed and consent obtained   Timeout: patient name, date of birth, surgical site, and procedure verified   Procedure prep:  Patient was prepped and draped in usual sterile fashion Instrument used: #15 blade   Hemostasis achieved with comment:  3.0 PDS with dermabond and steri strips Additional details:  Final length 5.0  Specimen 1 - Surgical pathology Differential Diagnosis: SCC [id number] Check Margins: No   No follow-ups on file.  I, Darice Smock, CMA, am acting as scribe for RUFUS CHRISTELLA HOLY, MD.   Documentation: I have reviewed the above documentation for accuracy and completeness, and I agree with the above.  RUFUS CHRISTELLA HOLY, MD

## 2023-10-02 NOTE — Patient Instructions (Signed)

## 2023-10-02 NOTE — Telephone Encounter (Signed)
 FYI- She wanted to let Dr. Alvia know also had biopsy on 09/30/2023  with [facility]  - results showing could  be lymph nodes as well.  This is the reason for the referral - would she need this sent with referral as well ? Not showing in patient chart currently .

## 2023-10-03 LAB — SURGICAL PATHOLOGY

## 2023-10-06 ENCOUNTER — Ambulatory Visit: Payer: Self-pay | Admitting: Dermatology

## 2023-10-07 ENCOUNTER — Ambulatory Visit

## 2023-10-09 ENCOUNTER — Ambulatory Visit: Admitting: Dermatology

## 2023-10-21 ENCOUNTER — Ambulatory Visit: Admitting: Dermatology

## 2023-10-21 ENCOUNTER — Encounter: Payer: Self-pay | Admitting: Dermatology

## 2023-10-21 DIAGNOSIS — L905 Scar conditions and fibrosis of skin: Secondary | ICD-10-CM

## 2023-10-21 DIAGNOSIS — L821 Other seborrheic keratosis: Secondary | ICD-10-CM | POA: Diagnosis not present

## 2023-10-21 DIAGNOSIS — D1801 Hemangioma of skin and subcutaneous tissue: Secondary | ICD-10-CM | POA: Diagnosis not present

## 2023-10-21 DIAGNOSIS — Z1283 Encounter for screening for malignant neoplasm of skin: Secondary | ICD-10-CM | POA: Diagnosis not present

## 2023-10-21 DIAGNOSIS — W908XXA Exposure to other nonionizing radiation, initial encounter: Secondary | ICD-10-CM | POA: Diagnosis not present

## 2023-10-21 DIAGNOSIS — L578 Other skin changes due to chronic exposure to nonionizing radiation: Secondary | ICD-10-CM | POA: Diagnosis not present

## 2023-10-21 DIAGNOSIS — Z85828 Personal history of other malignant neoplasm of skin: Secondary | ICD-10-CM | POA: Diagnosis not present

## 2023-10-21 DIAGNOSIS — L814 Other melanin hyperpigmentation: Secondary | ICD-10-CM | POA: Diagnosis not present

## 2023-10-21 DIAGNOSIS — L820 Inflamed seborrheic keratosis: Secondary | ICD-10-CM

## 2023-10-21 DIAGNOSIS — D229 Melanocytic nevi, unspecified: Secondary | ICD-10-CM

## 2023-10-21 NOTE — Progress Notes (Unsigned)
 Follow-Up Visit   Subjective  Anna Marquez is a 73 y.o. female who presents for the following: Skin Cancer Screening and Full Body Skin Exam  The patient presents for Total-Body Skin Exam (TBSE) for skin cancer screening and mole check. The patient has spots, moles and lesions to be evaluated, some may be new or changing.  The following portions of the chart were reviewed this encounter and updated as appropriate: medications, allergies, medical history  Review of Systems:  No other skin or systemic complaints except as noted in HPI or Assessment and Plan.  Objective  Well appearing patient in no apparent distress; mood and affect are within normal limits.  A full examination was performed including scalp, head, eyes, ears, nose, lips, neck, chest, axillae, abdomen, back, buttocks, bilateral upper extremities, bilateral lower extremities, hands, feet, fingers, toes, fingernails, and toenails. All findings within normal limits unless otherwise noted below.   Relevant physical exam findings are noted in the Assessment and Plan.  Left Abdomen (side) - Lower x 1, right arm x 1 (2) Inflamed stuck on papule  Assessment & Plan   SKIN CANCER SCREENING PERFORMED TODAY.  ACTINIC DAMAGE - Chronic condition, secondary to cumulative UV/sun exposure - diffuse scaly erythematous macules with underlying dyspigmentation - Recommend daily broad spectrum sunscreen SPF 30+ to sun-exposed areas, reapply every 2 hours as needed.  - Staying in the shade or wearing long sleeves, sun glasses (UVA+UVB protection) and wide brim hats (4-inch brim around the entire circumference of the hat) are also recommended for sun protection.  - Call for new or changing lesions.  MELANOCYTIC NEVI - Tan-brown and/or pink-flesh-colored symmetric macules and papules - Benign appearing on exam today - Observation - Call clinic for new or changing moles - Recommend daily use of broad spectrum spf 30+ sunscreen to  sun-exposed areas.   LENTIGINES Exam: scattered tan macules Due to sun exposure Treatment Plan: Benign-appearing, observe. Recommend daily broad spectrum sunscreen SPF 30+ to sun-exposed areas, reapply every 2 hours as needed.  Call for any changes   HEMANGIOMA Exam: red papule(s) Discussed benign nature. Recommend observation. Call for changes.   SEBORRHEIC KERATOSIS - Stuck-on, waxy, tan-brown papules and/or plaques  - Benign-appearing - Discussed benign etiology and prognosis. - Observe - Call for any changes  HISTORY OF SQUAMOUS CELL CARCINOMA OF THE SKIN - No evidence of recurrence today - No lymphadenopathy - Recommend regular full body skin exams - Recommend daily broad spectrum sunscreen SPF 30+ to sun-exposed areas, reapply every 2 hours as needed.  - Call if any new or changing lesions are noted between office visits   INFLAMED SEBORRHEIC KERATOSIS (2) Left Abdomen (side) - Lower x 1, right arm x 1 (2) Destruction of lesion - Left Abdomen (side) - Lower x 1, right arm x 1 (2) Complexity: simple   Destruction method: cryotherapy   Informed consent: discussed and consent obtained   Outcome: patient tolerated procedure well with no complications   Post-procedure details: wound care instructions given    SCAR   HISTORY OF SCC (SQUAMOUS CELL CARCINOMA) OF SKIN   LENTIGINES   CHERRY ANGIOMA   SEBORRHEIC KERATOSIS   MULTIPLE BENIGN NEVI   ACTINIC SKIN DAMAGE   Return in about 6 months (around 04/20/2024) for TBSE.  I, Darice Smock, CMA, am acting as scribe for RUFUS CHRISTELLA HOLY, MD.   Documentation: I have reviewed the above documentation for accuracy and completeness, and I agree with the above.  RUFUS CHRISTELLA HOLY, MD

## 2023-10-21 NOTE — Patient Instructions (Addendum)

## 2023-11-03 ENCOUNTER — Other Ambulatory Visit: Payer: Self-pay

## 2023-11-03 MED ORDER — ALBUTEROL SULFATE HFA 108 (90 BASE) MCG/ACT IN AERS: 1.0000 | INHALATION_SPRAY | Freq: Four times a day (QID) | RESPIRATORY_TRACT | 4 refills | Status: AC | PRN

## 2023-11-11 ENCOUNTER — Telehealth (HOSPITAL_BASED_OUTPATIENT_CLINIC_OR_DEPARTMENT_OTHER): Payer: Self-pay

## 2023-11-11 DIAGNOSIS — E042 Nontoxic multinodular goiter: Secondary | ICD-10-CM | POA: Diagnosis not present

## 2023-11-11 DIAGNOSIS — D440 Neoplasm of uncertain behavior of thyroid gland: Secondary | ICD-10-CM | POA: Insufficient documentation

## 2023-11-11 NOTE — Telephone Encounter (Signed)
   Pre-operative Risk Assessment    Patient Name: Anna Marquez  DOB: [birth year] MRN: [id number]   Date of last office visit: 09/22/2023 - Dr. Rolan Date of next office visit: N/A   Request for Surgical Clearance    Procedure:  total thyroidectomy  Date of Surgery:  Clearance TBD                                 Surgeon:  Dr. Krystal Spinner Surgeon's Group or Practice Name:  [facility]  Phone number:  [phone] Fax number:  [phone] or [phone] - Attention Burnard Crete, LPN   Type of Clearance Requested:   - Medical  - Pharmacy:  Hold Apixaban  (Eliquis ) -- Needs instructions on holding Eliquis    Type of Anesthesia:  General    Additional requests/questions:  N/A  Bonney Patrcia Iverson LITTIE   11/11/2023, 5:16 PM

## 2023-11-19 NOTE — Telephone Encounter (Signed)
.  Patient with diagnosis of atrial fibrillation on Eliquis  for anticoagulation.    Procedure:  total thyroidectomy   Date of Surgery:  Clearance TBD       CHA2DS2-VASc Score = 4   This indicates a 4.8% annual risk of stroke. The patient's score is based upon: CHF History: 1 HTN History: 1 Diabetes History: 0 Stroke History: 0 Vascular Disease History: 0 Age Score: 1 Gender Score: 1    CrCl 63 Platelet count 204  Patient has not had an Afib/aflutter ablation in the last 3 months, DCCV within the last 4 weeks or a watchman implanted in the last 45 days   Per office protocol, patient can hold Eliquis  for 2 days prior to procedure.   Patient will not need bridging with Lovenox  (enoxaparin ) around procedure.  **This guidance is not considered finalized until pre-operative APP has relayed final recommendations.**

## 2023-11-20 ENCOUNTER — Telehealth (HOSPITAL_BASED_OUTPATIENT_CLINIC_OR_DEPARTMENT_OTHER): Payer: Self-pay | Admitting: *Deleted

## 2023-11-20 NOTE — Telephone Encounter (Signed)
   Name: Anna Marquez  DOB: 1950/07/24  MRN: [id number]  Primary Cardiologist: Aleene Passe, MD (Inactive)   Preoperative team, please contact this patient and set up a phone call appointment for further preoperative risk assessment. Please obtain consent and complete medication review. Thank you for your help.  I confirm that guidance regarding antiplatelet and oral anticoagulation therapy has been completed and, if necessary, noted below.  Patient has not had an Afib/aflutter ablation in the last 3 months, DCCV within the last 4 weeks or a watchman implanted in the last 45 days    Per office protocol, patient can hold Eliquis  for 2 days prior to procedure.   Patient will not need bridging with Lovenox  (enoxaparin ) around procedure.  I also confirmed the patient resides in the state of [place] . As per [facility] Medical Board telemedicine laws, the patient must reside in the state in which the provider is licensed.   Anna CHRISTELLA Beauvais, NP 11/20/2023, 9:20 AM [facility]

## 2023-11-20 NOTE — Telephone Encounter (Signed)
 Pt has been scheduled tele preop appt 11/25/23. Pt states DR. Gerkin waiting for clearance before scheduling procedure. Med rec and consent are done.

## 2023-11-20 NOTE — Telephone Encounter (Signed)
 Pt has been scheduled tele preop appt 11/25/23. Pt states DR. Gerkin waiting for clearance before scheduling procedure. Med rec and consent are done.     Patient Consent for Virtual Visit        Anna Marquez has provided verbal consent on 11/20/2023 for a virtual visit (video or telephone).   CONSENT FOR VIRTUAL VISIT FOR:  Anna Marquez  By participating in this virtual visit I agree to the following:  I hereby voluntarily request, consent and authorize [facility] and its employed or contracted physicians, physician assistants, nurse practitioners or other licensed health care professionals (the Practitioner), to provide me with telemedicine health care services (the "Services) as deemed necessary by the treating Practitioner. I acknowledge and consent to receive the Services by the Practitioner via telemedicine. I understand that the telemedicine visit will involve communicating with the Practitioner through live audiovisual communication technology and the disclosure of certain medical information by electronic transmission. I acknowledge that I have been given the opportunity to request an in-person assessment or other available alternative prior to the telemedicine visit and am voluntarily participating in the telemedicine visit.  I understand that I have the right to withhold or withdraw my consent to the use of telemedicine in the course of my care at any time, without affecting my right to future care or treatment, and that the Practitioner or I may terminate the telemedicine visit at any time. I understand that I have the right to inspect all information obtained and/or recorded in the course of the telemedicine visit and may receive copies of available information for a reasonable fee.  I understand that some of the potential risks of receiving the Services via telemedicine include:  Delay or interruption in medical evaluation due to technological equipment failure or  disruption; Information transmitted may not be sufficient (e.g. poor resolution of images) to allow for appropriate medical decision making by the Practitioner; and/or  In rare instances, security protocols could fail, causing a breach of personal health information.  Furthermore, I acknowledge that it is my responsibility to provide information about my medical history, conditions and care that is complete and accurate to the best of my ability. I acknowledge that Practitioner's advice, recommendations, and/or decision may be based on factors not within their control, such as incomplete or inaccurate data provided by me or distortions of diagnostic images or specimens that may result from electronic transmissions. I understand that the practice of medicine is not an exact science and that Practitioner makes no warranties or guarantees regarding treatment outcomes. I acknowledge that a copy of this consent can be made available to me via my patient portal [facility] MyChart), or I can request a printed copy by calling the office of [facility].    I understand that my insurance will be billed for this visit.   I have read or had this consent read to me. I understand the contents of this consent, which adequately explains the benefits and risks of the Services being provided via telemedicine.  I have been provided ample opportunity to ask questions regarding this consent and the Services and have had my questions answered to my satisfaction. I give my informed consent for the services to be provided through the use of telemedicine in my medical care

## 2023-11-24 NOTE — Progress Notes (Unsigned)
 Virtual Visit via Telephone Note   Because of Anna Marquez co-morbid illnesses, she is at least at moderate risk for complications without adequate follow up.  This format is felt to be most appropriate for this patient at this time.  Due to technical limitations with video connection (technology), today's appointment will be conducted as an audio only telehealth visit, and Anna Marquez verbally agreed to proceed in this manner.   All issues noted in this document were discussed and addressed.  No physical exam could be performed with this format.  Evaluation Performed:  Preoperative cardiovascular risk assessment _____________   Date:  11/24/2023   Patient ID:  Anna Marquez, DOB 1950-04-20, MRN [id number] Patient Location:  Home Provider location:   Office  Primary Care Provider:  Alvia Bring, DO Primary Cardiologist:  Aleene Passe, MD (Inactive)  Chief Complaint / Patient Profile   73 y.o. y/o female with a h/o hypertension, TIA, persistent atrial fibrillation who is pending total thyroidectomy and presents today for telephonic preoperative cardiovascular risk assessment.  History of Present Illness    Anna Marquez is a 73 y.o. female who presents via audio/video conferencing for a telehealth visit today.  Pt was last seen in cardiology clinic on 09/22/2023 by Dr. Rolan.  At that time Anna Marquez was doing well .  The patient is now pending procedure as outlined above. Since her last visit, she continues to be stable from a cardiac standpoint.  Today's she denies chest pain, shortness of breath, lower extremity edema, fatigue, palpitations, melena, hematuria, hemoptysis, diaphoresis, weakness, presyncope, syncope, orthopnea, and PND.   Past Medical History    Past Medical History:  Diagnosis Date   Adenomatous colon polyp 2003   Allergy    seasonal   Asthma    Cataract    Cholelithiasis    COVID    Diverticulosis of colon    Gastric polyps    GERD  (gastroesophageal reflux disease)    Helicobacter pylori (H. pylori)    Hiatal hernia    Hypertension    Mitral valve prolapse    Nephrolithiasis    Osteoarthritis    Osteopenia 03/2017   T score -1.5 FRAX 8.9% / 0.9%.  Stable from prior DEXA 2016   Persistent atrial fibrillation (HCC)    Squamous cell carcinoma of skin 12/12/2022   Left lower leg anterior (KA type) - Mohs 01/06/2023   Squamous cell carcinoma of skin 01/06/2023   Left lower leg near ankle (KA type) - needs Mohs   Squamous cell carcinoma of skin 06/26/2023   Well differentiated. Right thigh- anterior (appt 08/21/2023)   TGA (transient global amnesia)    Thyroid  disease 06/09/21   Past Surgical History:  Procedure Laterality Date   ABDOMINAL HYSTERECTOMY     partial   ANTERIOR AND POSTERIOR REPAIR N/A 06/14/2014   Procedure: ANTERIOR (CYSTOCELE) AND POSTERIOR REPAIR (RECTOCELE);  Surgeon: Evalene SHAUNNA Organ, MD;  Location: [facility];  Service: Gynecology;  Laterality: N/A;   ATRIAL FIBRILLATION ABLATION N/A 02/11/2019   Procedure: ATRIAL FIBRILLATION ABLATION;  Surgeon: Kelsie Agent, MD;  Location: [facility];  Service: Cardiovascular;  Laterality: N/A;   ATRIAL FIBRILLATION ABLATION N/A 11/23/2021   Procedure: ATRIAL FIBRILLATION ABLATION;  Surgeon: Inocencio Soyla Lunger, MD;  Location: [facility];  Service: Cardiovascular;  Laterality: N/A;   CARDIOVERSION N/A 01/15/2019   Procedure: CARDIOVERSION;  Surgeon: Okey Vina GAILS, MD;  Location[facility];  Service: Cardiovascular;  Laterality: N/A;  CARDIOVERSION N/A 07/09/2021   Procedure: CARDIOVERSION;  Surgeon: Rolan Ezra RAMAN, MD;  Location: [facility] ENDOSCOPY;  Service: Cardiovascular;  Laterality: N/A;   CHOLECYSTECTOMY     kidney stone removal     x2   VAGINAL HYSTERECTOMY N/A 06/14/2014   Procedure: HYSTERECTOMY VAGINAL;  Surgeon: Evalene SHAUNNA Organ, MD;  Location: [facility];  Service: Gynecology;  Laterality: N/A;    Allergies  Allergies  Allergen  Reactions   Erythromycin      Upset stomach   Nsaids Other (See Comments)    Has a history of bleeding ulcers, tolerates aspirin     Penicillins Hives and Shortness Of Breath    Did it involve swelling of the face/tongue/throat, SOB, or low BP? Yes Did it involve sudden or severe rash/hives, skin peeling, or any reaction on the inside of your mouth or nose? No Did you need to seek medical attention at a hospital or doctor's office? No When did it last happen?      45 years If all above answers are "NO", may proceed with cephalosporin use.     Home Medications    Prior to Admission medications   Medication Sig Start Date End Date Taking? Authorizing Provider  acetaminophen  (TYLENOL ) 500 MG tablet Take 1,000 mg by mouth every 6 (six) hours as needed for moderate pain or headache.    [provider]  albuterol  (VENTOLIN  HFA) 108 (90 Base) MCG/ACT inhaler Inhale 1-2 puffs into the lungs every 6 (six) hours as needed for wheezing or shortness of breath. 11/03/23   Alvia Bring, DO  ALPRAZolam  (XANAX ) 0.5 MG tablet TAKE 1 TO 2 TABLETS BY MOUTH 2 TIMES DAILY AS NEEDED FOR ANXIETY OR SLEEP. 08/27/23   Alvia Bring, DO  apixaban  (ELIQUIS ) 5 MG TABS tablet TAKE 1 TABLET BY MOUTH 2 TIMES DAILY. 12/16/22   Rolan Ezra RAMAN, MD  Ascorbic Acid (VITAMIN C) 1000 MG tablet Take 1,000 mg by mouth daily.    [provider]  atropine 1 % ophthalmic solution Place 1 drop into the left eye daily. Patient taking differently: Place 1 drop into the left eye daily as needed.    [provider]  Carboxymethylcellulose Sodium (EYE DROPS OP) Place 1 drop in the left eye every other hour- CHLORHEXIDINE , PRESERVED, 0.02 % OPHTHALMIC SOLUTION Patient not taking: Reported on 11/20/2023    [provider]  Cholecalciferol  (VITAMIN D ) 50 MCG (2000 UT) tablet Take 2,000 Units by mouth daily. Patient taking differently: Take 2,000 Units by mouth daily. PT STATES TAKES VITAMIN D3 125  MCG/5000 international UNITS TAKE 2 X DAILY 11/20/23 PER PT    [provider]  doxycycline  (MONODOX ) 50 MG capsule Take 50 mg by mouth daily. Patient not taking: Reported on 11/20/2023    [provider]  FARXIGA  10 MG TABS tablet TAKE 1 TABLET (10 MG TOTAL) BY MOUTH DAILY. 08/14/23   Rolan Ezra RAMAN, MD  ferrous sulfate 325 (65 FE) MG tablet Take 325 mg by mouth every other day.    [provider]  fluticasone  (FLONASE ) 50 MCG/ACT nasal spray PLACE 1 SPRAY INTO BOTH NOSTRILS AS NEEDED FOR ALLERGIES OR RHINITIS. 05/13/23   Alvia Bring, DO  furosemide  (LASIX ) 20 MG tablet TAKE 1 TABLET (20 MG TOTAL) BY MOUTH AS NEEDED FOR EDEMA OR FLUID. 04/14/23 11/20/23  Rolan Ezra RAMAN, MD  gabapentin (NEURONTIN) 100 MG capsule Take 100 mg by mouth 3 (three) times daily. Patient taking differently: Take 100 mg by mouth 3 (three) times daily. PT  STATES ONLY TAKES PRN    [provider]  IMPAVIDO 50 MG CAPS Take 1 capsule by mouth 2 (two) times daily. Patient not taking: Reported on 11/20/2023    [provider]  loratadine  (CLARITIN ) 10 MG tablet Take 10 mg by mouth daily.     [provider]  methimazole  (TAPAZOLE ) 5 MG tablet Take 5 mg by mouth daily. 06/13/22   [provider]  metoprolol  succinate (TOPROL -XL) 100 MG 24 hr tablet TAKE 1 TABLET (100 MG TOTAL) BY MOUTH DAILY. TAKE WITH OR IMMEDIATELY FOLLOWING A MEAL. 08/27/23   Rolan Ezra RAMAN, MD  moxifloxacin  (VIGAMOX ) 0.5 % ophthalmic solution Place 1 drop into the left eye 6 (six) times daily. Patient not taking: Reported on 11/20/2023    [provider]  NONFORMULARY OR COMPOUNDED ITEM Apply 1 drop to eye. 04/23/23   [provider]  pantoprazole  (PROTONIX ) 40 MG tablet TAKE 1 TABLET (40 MG TOTAL) BY MOUTH DAILY. 05/13/23   Alvia Bring, DO  potassium chloride  (KLOR-CON ) 10 MEQ tablet TAKE 1 TABLET BY MOUTH DAILY AS NEEDED (TAKE WITH HCTZ). Patient taking differently: PRN 05/06/22    Rolan Ezra RAMAN, MD  Probiotic Product (PROBIOTIC PO) Take 1 capsule by mouth daily.    [provider]  rosuvastatin  (CRESTOR ) 10 MG tablet TAKE 1 TABLET (10 MG TOTAL) BY MOUTH DAILY. 06/06/23   Rolan Ezra RAMAN, MD  spironolactone  (ALDACTONE ) 25 MG tablet TAKE 1/2 TABLET (12.5 MG TOTAL) BY MOUTH DAILY. 08/01/23   [place], Harlene HERO, FNP  trifluridine (VIROPTIC) 1 % ophthalmic solution Place 1 drop into the left eye 5 (five) times daily. Patient not taking: Reported on 11/20/2023 03/18/23   [provider]  trimethoprim -polymyxin b (POLYTRIM) ophthalmic solution Place 1 drop into the left eye. Patient does 6 times  a day. Patient not taking: Reported on 11/20/2023    [provider]  valACYclovir (VALTREX) 1000 MG tablet Take 1,000 mg by mouth every morning.    [provider]  vitamin E 180 MG (400 UNITS) capsule Take 400 Units by mouth daily.    [provider]  ZINC CITRATE PO Take 22 mg by mouth daily.    [provider]    Physical Exam    Vital Signs:  Anna Marquez does not have vital signs available for review today.  Given telephonic nature of communication, physical exam is limited. AAOx3. NAD. Normal affect.  Speech and respirations are unlabored.  Accessory Clinical Findings    None  Assessment & Plan    1.  Preoperative Cardiovascular Risk Assessment:total thyroidectomy   Date of Surgery:  Clearance TBD                                  Surgeon:  Dr. Krystal Spinner Surgeon's Group or Practice Name:  [facility]  Phone number:  [phone] Fax number:  [phone] or [phone]      Primary Cardiologist: Aleene Passe, MD (Inactive)  Chart reviewed as part of pre-operative protocol coverage. Given past medical history and time since last visit, based on ACC/AHA guidelines, Anna Marquez would be at acceptable risk for the planned procedure without further cardiovascular testing.   Her RCRI is  moderate risk, 6.6% risk of major cardiac event.  She is able to complete greater than 4 METS of physical activity.  Patient was advised that if she develops new symptoms prior to surgery  to contact our office to arrange a follow-up appointment.  She verbalized understanding.  Patient has not had an Afib/aflutter ablation in the last 3 months, DCCV within the last 4 weeks or a watchman implanted in the last 45 days    Per office protocol, patient can hold Eliquis  for 2 days prior to procedure.   Patient will not need bridging with Lovenox  (enoxaparin ) around procedure.  I will route this recommendation to the requesting party via Epic fax function and remove from pre-op pool.       Time:   Today, I have spent 10 minutes with the patient with telehealth technology discussing medical history, symptoms, and management plan.  I spent 10 minutes reviewing patient's past cardiac history and cardiac medications.    [name], NP  11/24/2023, 3:45 PM

## 2023-11-25 ENCOUNTER — Ambulatory Visit: Attending: Internal Medicine

## 2023-11-25 DIAGNOSIS — Z01810 Encounter for preprocedural cardiovascular examination: Secondary | ICD-10-CM

## 2023-11-26 ENCOUNTER — Ambulatory Visit: Admitting: Family Medicine

## 2023-11-26 VITALS — BP 133/82 | HR 79 | Ht 64.0 in | Wt 178.0 lb

## 2023-11-26 DIAGNOSIS — E042 Nontoxic multinodular goiter: Secondary | ICD-10-CM

## 2023-11-26 DIAGNOSIS — B6013 Keratoconjunctivitis due to Acanthamoeba: Secondary | ICD-10-CM

## 2023-11-26 DIAGNOSIS — Z634 Disappearance and death of family member: Secondary | ICD-10-CM | POA: Diagnosis not present

## 2023-11-26 DIAGNOSIS — I10 Essential (primary) hypertension: Secondary | ICD-10-CM

## 2023-11-26 DIAGNOSIS — I4819 Other persistent atrial fibrillation: Secondary | ICD-10-CM | POA: Diagnosis not present

## 2023-11-26 DIAGNOSIS — F4321 Adjustment disorder with depressed mood: Secondary | ICD-10-CM

## 2023-11-26 MED ORDER — TRAZODONE HCL 50 MG PO TABS: 50.0000 mg | ORAL_TABLET | Freq: Every evening | ORAL | 3 refills | Status: AC | PRN

## 2023-11-26 MED ORDER — ALPRAZOLAM 0.5 MG PO TABS: ORAL_TABLET | ORAL | 2 refills | Status: AC

## 2023-11-26 MED ORDER — TRAMADOL HCL 50 MG PO TABS: 50.0000 mg | ORAL_TABLET | Freq: Three times a day (TID) | ORAL | 1 refills | Status: AC | PRN

## 2023-11-26 NOTE — Progress Notes (Signed)
 Anna Marquez - 72 y.o. female MRN [id number]  Date of birth: 10-Jun-1950  Subjective Chief Complaint  Patient presents with   Hypertension   Wheezing   Knee Pain    HPI Anna Marquez is a 73 y.o. female here today for follow up visit.   She had consultation with Dr. Eletha regarding thyroid  nodules.  Recently dx thyroid  neoplasm of uncertain behavior.  Has upcoming thyroidectomy.  She does have an endocrinologist as well but would prefer that I monitor her thyroid  function postoperatively.  She has had multiple skin lesions removed over the past few months.    She has had some increased wheezing over the past couple of weeks.  Steroids have caused her to go into A. Fib in the past.  She does have albuterol .  Denies dyspnea at this time.  Acanthamoeba infection has been eradicated at this point however her ophthalmologist wants to see this remain gone over the next several months before considering corneal transplant.  She has recently had cataract surgery.  Still some complicated grief with anxiety.  Difficulty with sleep at times.  Using alprazolam  as needed.  She would also like renewal of trazodone .   ROS:  A comprehensive ROS was completed and negative except as noted per HPI  Allergies  Allergen Reactions   Erythromycin      Upset stomach   Nsaids Other (See Comments)    Has a history of bleeding ulcers, tolerates aspirin     Penicillins Hives and Shortness Of Breath    Did it involve swelling of the face/tongue/throat, SOB, or low BP? Yes Did it involve sudden or severe rash/hives, skin peeling, or any reaction on the inside of your mouth or nose? No Did you need to seek medical attention at a hospital or doctor's office? No When did it last happen?      45 years If all above answers are "NO", may proceed with cephalosporin use.     Past Medical History:  Diagnosis Date   Adenomatous colon polyp 2003   Allergy    seasonal   Asthma    Cataract    Cholelithiasis     COVID    Diverticulosis of colon    Gastric polyps    GERD (gastroesophageal reflux disease)    Helicobacter pylori (H. pylori)    Hiatal hernia    Hypertension    Mitral valve prolapse    Nephrolithiasis    Osteoarthritis    Osteopenia 03/2017   T score -1.5 FRAX 8.9% / 0.9%.  Stable from prior DEXA 2016   Persistent atrial fibrillation (HCC)    Squamous cell carcinoma of skin 12/12/2022   Left lower leg anterior (KA type) - Mohs 01/06/2023   Squamous cell carcinoma of skin 01/06/2023   Left lower leg near ankle (KA type) - needs Mohs   Squamous cell carcinoma of skin 06/26/2023   Well differentiated. Right thigh- anterior (appt 08/21/2023)   TGA (transient global amnesia)    Thyroid  disease 06/09/21    Past Surgical History:  Procedure Laterality Date   ABDOMINAL HYSTERECTOMY     partial   ANTERIOR AND POSTERIOR REPAIR N/A 06/14/2014   Procedure: ANTERIOR (CYSTOCELE) AND POSTERIOR REPAIR (RECTOCELE);  Surgeon: Evalene SHAUNNA Organ, MD;  Location: [facility];  Service: Gynecology;  Laterality: N/A;   ATRIAL FIBRILLATION ABLATION N/A 02/11/2019   Procedure: ATRIAL FIBRILLATION ABLATION;  Surgeon: Kelsie Agent, MD;  Location: [facility];  Service: Cardiovascular;  Laterality: N/A;   ATRIAL FIBRILLATION  ABLATION N/A 11/23/2021   Procedure: ATRIAL FIBRILLATION ABLATION;  Surgeon: Inocencio Soyla Lunger, MD;  Location: [facility];  Service: Cardiovascular;  Laterality: N/A;   CARDIOVERSION N/A 01/15/2019   Procedure: CARDIOVERSION;  Surgeon: Okey Vina GAILS, MD;  Location: [facility] ENDOSCOPY;  Service: Cardiovascular;  Laterality: N/A;   CARDIOVERSION N/A 07/09/2021   Procedure: CARDIOVERSION;  Surgeon: Rolan Ezra RAMAN, MD;  Location: [facility] ENDOSCOPY;  Service: Cardiovascular;  Laterality: N/A;   CHOLECYSTECTOMY     kidney stone removal     x2   VAGINAL HYSTERECTOMY N/A 06/14/2014   Procedure: HYSTERECTOMY VAGINAL;  Surgeon: Evalene SHAUNNA Organ, MD;  Location: [facility];  Service:  Gynecology;  Laterality: N/A;    Social History   Socioeconomic History   Marital status: Divorced    Spouse name: Not on file   Number of children: 2   Years of education: 12th grade   Highest education level: 12th grade  Occupational History   Occupation: Retired,  Tobacco Use   Smoking status: Never   Smokeless tobacco: Never   Tobacco comments:    Never smoke 07/16/21  Vaping Use   Vaping status: Never Used  Substance and Sexual Activity   Alcohol use: Never    Alcohol/week: 0.0 standard drinks of alcohol   Drug use: No   Sexual activity: Yes    Birth control/protection: Post-menopausal    Comment: 1st intercourse 73 yo-Fewer than 5 partners  Other Topics Concern   Not on file  Social History Narrative   Lives along with her dog. She enjoys croteching, crafts, making wreaths, reading and gardening in her free time.    Social Drivers of Corporate Investment Banker Strain: Low Risk  (11/24/2023)   Overall Financial Resource Strain (CARDIA)    Difficulty of Paying Living Expenses: Not very hard  Food Insecurity: No Food Insecurity (11/24/2023)   Hunger Vital Sign    Worried About Running Out of Food in the Last Year: Never true    Ran Out of Food in the Last Year: Never true  Transportation Needs: No Transportation Needs (11/24/2023)   PRAPARE - Administrator, Civil Service (Medical): No    Lack of Transportation (Non-Medical): No  Physical Activity: Insufficiently Active (11/24/2023)   Exercise Vital Sign    Days of Exercise per Week: 3 days    Minutes of Exercise per Session: 20 min  Stress: Stress Concern Present (11/24/2023)   [employer] of Occupational Health - Occupational Stress Questionnaire    Feeling of Stress: To some extent  Social Connections: Moderately Integrated (11/24/2023)   Social Connection and Isolation Panel    Frequency of Communication with Friends and Family: More than three times a week    Frequency of Social  Gatherings with Friends and Family: Three times a week    Attends Religious Services: More than 4 times per year    Active Member of Clubs or Organizations: Yes    Attends Engineer, Structural: More than 4 times per year    Marital Status: Divorced    Family History  Problem Relation Age of Onset   Cancer Father        prostate   Alcohol abuse Father    Hearing loss Father    Breast cancer Paternal Aunt 45   Diabetes Maternal Grandmother    Heart disease Maternal Grandmother    Hypertension Maternal Grandmother    Heart disease Maternal Grandfather    Melanoma Paternal Aunt  Neuropathy Mother    Congestive Heart Failure Mother    COPD Mother    Hearing loss Mother    Vision loss Mother    Arthritis Paternal Grandmother    Thyroid  disease Daughter    Heart disease Maternal Uncle    Kidney disease Brother     Health Maintenance  Topic Date Due   Medicare Annual Wellness (AWV)  03/27/2022   Mammogram  07/27/2022   Colonoscopy  03/13/2023   Zoster Vaccines- Shingrix (1 of 2) 02/26/2024 (Originally 02/02/1969)   Influenza Vaccine  04/06/2024 (Originally 08/08/2023)   COVID-19 Vaccine (3 - Pfizer risk series) 12/11/2024 (Originally 04/21/2019)   DTaP/Tdap/Td (3 - Tdap) 10/29/2027   Pneumococcal Vaccine: 50+ Years  Completed   Bone Density Scan  Completed   Hepatitis C Screening  Completed   Meningococcal B Vaccine  Aged Out     ----------------------------------------------------------------------------------------------------------------------------------------------------------------------------------------------------------------- Physical Exam BP 133/82 (BP Location: Left Arm, Patient Position: Sitting, Cuff Size: Normal)   Pulse 79   Ht 5' 4 (1.626 m)   Wt 178 lb (80.7 kg)   SpO2 97%   BMI 30.55 kg/m   Physical Exam Constitutional:      Appearance: Normal appearance.  HENT:     Head: Normocephalic and atraumatic.  Eyes:     General: No scleral  icterus. Cardiovascular:     Rate and Rhythm: Normal rate and regular rhythm.  Pulmonary:     Effort: Pulmonary effort is normal.     Breath sounds: Normal breath sounds.  Musculoskeletal:     Cervical back: Neck supple.  Neurological:     Mental Status: She is alert.  Psychiatric:        Mood and Affect: Mood normal.        Behavior: Behavior normal.     ------------------------------------------------------------------------------------------------------------------------------------------------------------------------------------------------------------------- Assessment and Plan  Grief at loss of child She is seeing counselor for grief counseling.  Continue alprazolam  as needed for now she is aware to use this sparingly..  Prescription renewed.  Persistent atrial fibrillation (HCC) She has done well since ablation without any new episodes of A-fib.  Off of amiodarone  at this point.  She does remain on metoprolol  and anticoagulation with Eliquis .  Essential hypertension Blood pressure well-controlled at this time.  She will continue current medications for management of her hypertension and cardiac disorders.  Multiple thyroid  nodules Previous thyroid  biopsy with pathology that returned as potential for malignancy.  She has upcoming thyroidectomy.  Will plan to follow thyroid  labs postoperatively.  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  Infection has cleared and they want a make sure this is eradicated for at least 6 months prior to corneal transplant.   Meds ordered this encounter  Medications   ALPRAZolam  (XANAX ) 0.5 MG tablet    Sig: TAKE 1 TO 2 TABLETS BY MOUTH 2 TIMES DAILY AS NEEDED FOR ANXIETY OR SLEEP.    Dispense:  30 tablet    Refill:  2   traZODone  (DESYREL ) 50 MG tablet    Sig: Take 1-2 tablets (50-100 mg total) by mouth at bedtime as needed for sleep.    Dispense:  30 tablet    Refill:  3   traMADol  (ULTRAM ) 50 MG  tablet    Sig: Take 1 tablet (50 mg total) by mouth every 8 (eight) hours as needed.    Dispense:  30 tablet    Refill:  1    Return in about 4 months (around 03/25/2024) for Mood/BH, HTN.

## 2023-11-27 ENCOUNTER — Other Ambulatory Visit (HOSPITAL_COMMUNITY): Payer: Self-pay | Admitting: Cardiology

## 2023-12-07 ENCOUNTER — Encounter: Payer: Self-pay | Admitting: Family Medicine

## 2023-12-07 NOTE — Assessment & Plan Note (Signed)
 This is managed by [facility].  Infection has cleared and they want a make sure this is eradicated for at least 6 months prior to corneal transplant.

## 2023-12-07 NOTE — Assessment & Plan Note (Signed)
 She is seeing counselor for grief counseling.  Continue alprazolam  as needed for now she is aware to use this sparingly..  Prescription renewed.

## 2023-12-07 NOTE — Assessment & Plan Note (Signed)
Blood pressure well-controlled at this time.  She will continue current medications for management of her hypertension and cardiac disorders.

## 2023-12-07 NOTE — Assessment & Plan Note (Signed)
 Previous thyroid  biopsy with pathology that returned as potential for malignancy.  She has upcoming thyroidectomy.  Will plan to follow thyroid  labs postoperatively.

## 2023-12-07 NOTE — Assessment & Plan Note (Signed)
She has done well since ablation without any new episodes of A-fib.  Off of amiodarone at this point.  She does remain on metoprolol and anticoagulation with Eliquis.

## 2023-12-08 ENCOUNTER — Ambulatory Visit: Payer: Self-pay | Admitting: Surgery

## 2023-12-08 NOTE — H&P (Signed)
 REFERRING PHYSICIAN: Alvia Velma Shape, DO  PROVIDER: Evangelyne Loja OZELL SPINNER, MD  Chief Complaint: New Consultation (Thyroid  neoplasm of uncertain behavior, multiple thyroid  nodules)  History of Present Illness:  Patient is referred by her primary care physician, Dr. Velma Alvia, for surgical evaluation and management of multiple thyroid  nodules with a thyroid  neoplasm of uncertain behavior. Patient had originally been diagnosed with thyroid  nodules in 2023 at which time she presented to the hospital and congestive heart failure. Patient underwent additional evaluation by her endocrinologist, Dr. Donnice Lipps. This included an ultrasound which demonstrated 2 significant nodules in the left thyroid  lobe measuring 3 cm and 1.5 cm respectively. Both of these were felt to be moderately suspicious. There is a third nodule in the right thyroid  lobe measuring 8 mm in size which is felt to be highly suspicious. Patient underwent ultrasound-guided fine-needle aspiration biopsy. The large left-sided nodule was biopsied in September 2025. This demonstrated a follicular lesion of undetermined significance, Bethesda category III. Specimen was subsequently sent for molecular genetic testing with AFIRMA. This returned as suspicious with DNA mutations placing the patient had an elevated risk of malignancy of approximately 75%. Patient is therefore referred at this time for consideration for thyroidectomy for definitive diagnosis and management. Patient has no prior history of thyroid  disease. She has never been on thyroid  medication. Her recent TSH level is normal at 1.43. Patient has had no prior head or neck surgery. There is a family history of medical thyroid  disease in the patient's mother with hypothyroidism and the patient's daughter with hyperthyroidism. There is no family history of thyroid  cancer. Patient is accompanied today by her daughter-in-law.  Patient has a significant cardiac history. She is on  chronic anticoagulation with Eliquis . She has had multiple episodes of atrial fibrillation and has undergone cardiac ablation on multiple occasions. She is followed by Dr. Ezra Shuck.  Review of Systems: A complete review of systems was obtained from the patient. I have reviewed this information and discussed as appropriate with the patient. See HPI as well for other ROS.  Review of Systems  Constitutional: Negative.  HENT: Negative.  Eyes: Negative.  Respiratory: Negative.  Cardiovascular: Positive for palpitations.  Gastrointestinal: Negative.  Genitourinary: Negative.  Musculoskeletal: Positive for joint pain.  Skin: Negative.  Neurological: Negative.  Endo/Heme/Allergies: Negative.  Psychiatric/Behavioral: Negative.    Medical History: Past Medical History:  Diagnosis Date  Atrial fibrillation (CMS/HHS-HCC)   Patient Active Problem List  Diagnosis  Acanthamoeba keratitis  AKI (acute kidney injury)  Allergic rhinitis  Anemia  Atrial fibrillation with RVR (CMS/HHS-HCC)  Bilateral impacted cerumen  Chronic diastolic heart failure (CMS/HHS-HCC)  Conjunctivitis  Cough  Diaphragmatic hernia  Esophageal reflux  Essential hypertension  Hyperglycemia  Hyperthyroidism  Hypomagnesemia  Migraine without aura  Mitral regurgitation  Multiple thyroid  nodules  Osteoarthritis  Personal history of colonic polyps  Reactive airway disease (HHS-HCC)  Recurrent nephrolithiasis  Screening for lipid disorders  Seasonal allergic rhinitis due to pollen  Secondary hypercoagulable state (HHS-HCC)  Sinobronchitis  TIA (transient ischemic attack)  Pseudophakia of right eye  Neoplasm of uncertain behavior of thyroid  gland   Past Surgical History:  Procedure Laterality Date  EXTRACTION CATARACT EXTRACAPSULAR W/INSERTION INTRAOCULAR PROSTHESIS Right 08/28/2023  Procedure: Right - EXTRACAPSULAR CATARACT PHACO REMOVAL WITH INSERTION OF INTRAOCULAR LENS PROSTHESIS (1 STAGE PROCEDURE),  MANUAL OR MECHANICAL TECHNIQUE; WITHOUT ENDOSCOPIC CYCLOPHOTOCOAGULATION; Surgeon: Jefm Veleria Sauers, MD; Location: [facility]; Service: Ophthalmology; Laterality: Right;    Allergies  Allergen Reactions  Erythromycin  Nausea  And Vomiting  Upset stomach  Nsaids (Non-Steroidal Anti-Inflammatory Drug) Other (See Comments)  Has a history of bleeding ulcers, tolerates aspirin   Penicillin Hives   Current Outpatient Medications on File Prior to Visit  Medication Sig Dispense Refill  acetaminophen  (TYLENOL ) 500 MG tablet Take 500 mg by mouth every 6 (six) hours as needed for Pain  albuterol  90 mcg/actuation inhaler Inhale 2 inhalations into the lungs every 6 (six) hours as needed for Shortness of Breath  ALPRAZolam  (XANAX ) 0.5 MG tablet Take 0.5-1 mg by mouth once daily as needed for Sleep or Anxiety  ascorbic acid, vitamin C, (VITAMIN C) 1000 MG tablet Take 1,000 mg by mouth once daily  cholecalciferol  (VITAMIN D3) 2,000 unit capsule Take 2,000 Units by mouth once daily Supplement gummies  dapagliflozin  propanediol (FARXIGA ) 10 mg tablet Take 10 mg by mouth every morning  ELIQUIS  5 mg tablet Take 5 mg by mouth 2 (two) times daily  ferrous sulfate 325 (65 FE) MG tablet Take 325 mg by mouth every other day  fluticasone  propionate (FLONASE ) 50 mcg/actuation nasal spray Place 1 spray into both nostrils once daily as needed for Allergies  gabapentin (NEURONTIN) 100 MG capsule TAKE 1 CAPSULE BY MOUTH 3 TIMES DAILY (Patient taking differently: Take 100 mg by mouth every morning) 90 capsule 0  hydrOXYzine (ATARAX) 25 MG tablet  loratadine  (CLARITIN ) 10 mg tablet Take 10 mg by mouth once daily  methIMAzole  (TAPAZOLE ) 5 MG tablet Take 5 mg by mouth every morning  metoprolol  succinate (TOPROL -XL) 100 MG XL tablet Take 100 mg by mouth at bedtime  pantoprazole  (PROTONIX ) 40 MG DR tablet Take 40 mg by mouth every morning  potassium chloride  (KLOR-CON ) 10 MEQ ER tablet Take 10 mEq by mouth once  daily Medication taken with furosemide   rosuvastatin  (CRESTOR ) 10 MG tablet Take 1 tablet by mouth every morning  spironolactone  (ALDACTONE ) 25 MG tablet Take 25 mg by mouth at bedtime (12.5 mg)-1/2 tablet taken daily  UNABLE TO FIND Take 1 tablet by mouth every morning Probiotic-floragen(3 mg)-daily  vitamin E 400 UNIT capsule Take 400 Units by mouth once daily  ZINC ORAL Take 1 tablet by mouth once daily  zinc-vit C-pyridoxine, vit B6, 12-60-0.5 mg Lozg Take 22 mg by mouth once daily  AMIOdarone  (PACERONE ) 200 MG tablet Take 200 mg by mouth once daily (Patient not taking: Reported on 08/26/2023)  chlorhexidine , preserved, 0.02 % ophthalmic solution Place 1 drop into the left eye 6 (six) times daily (Patient not taking: Reported on 11/11/2023) 10 mL 1  Compound Medication Place 1 drop into the left eye 6 (six) times daily Med Name: Voriconazole 1% ophthalmic drops- apply one drop to left eye 6 times daily (Patient not taking: Reported on 11/11/2023) 1 each 2  FUROsemide  (LASIX ) 20 MG tablet Take 20 mg by mouth once daily as needed for Edema or High Blood Pressure  miltefosine (IMPAVIDO) 50 mg Cap Take 1 capsule by mouth 3 (three) times a day (Patient not taking: Reported on 08/26/2023) 90 capsule 0  moxifloxacin  (VIGAMOX ) 0.5 % ophthalmic solution Place 1 drop into the left eye 4 (four) times daily (Patient not taking: Reported on 11/11/2023) 3 mL 3  prednisoLONE  acetate (PRED FORTE ) 1 % ophthalmic suspension Place 1 drop into the left eye 3 (three) times daily (Patient not taking: Reported on 08/26/2023) 5 mL 0  prednisolone -moxiflox-bromfen 1-0.5-0.075 % DrpS Apply 1 drop to eye 3 (three) times daily Place 1 drop into the surgical eye 3 (three) times a day for 3  weeks. Do not start until after surgery. (Patient not taking: Reported on 08/26/2023) 8 mL 1  predniSONE  (DELTASONE ) 20 MG tablet Take 1 tablet (20 mg total) by mouth 2 (two) times daily (Patient not taking: Reported on 08/26/2023) 60 tablet 0   valACYclovir (VALTREX) 1000 MG tablet TAKE 1 TABLET (1,000 MG TOTAL) BY MOUTH ONCE DAILY (Patient not taking: Reported on 11/11/2023) 31 tablet 3  valACYclovir (VALTREX) 1000 MG tablet Take 1 tablet (1,000 mg total) by mouth once daily (Patient not taking: Reported on 11/11/2023) 30 tablet 11  valACYclovir (VALTREX) 500 MG tablet Take 1 tablet (500 mg total) by mouth once daily (Patient not taking: Reported on 11/11/2023) 30 tablet 11   No current facility-administered medications on file prior to visit.   Family History  Problem Relation Age of Onset  Macular degeneration Mother  Cataracts Mother  Glaucoma Mother  Glaucoma Maternal Grandmother  Anesthesia problems Neg Hx    Social History   Tobacco Use  Smoking Status Never  Passive exposure: Past  Smokeless Tobacco Never    Social History   Socioeconomic History  Marital status: Divorced  Tobacco Use  Smoking status: Never  Passive exposure: Past  Smokeless tobacco: Never  Vaping Use  Vaping status: Never Used  Substance and Sexual Activity  Alcohol use: Never  Drug use: Never  Sexual activity: Defer   Social Drivers of Health   Financial Resource Strain: Low Risk (07/28/2023)  Received from [facility]  Overall Financial Resource Strain (CARDIA)  How hard is it for you to pay for the very basics like food, housing, medical care, and heating?: Not very hard  Food Insecurity: No Food Insecurity (07/28/2023)  Received from [facility]  Hunger Vital Sign  Within the past 12 months, you worried that your food would run out before you got the money to buy more.: Never true  Within the past 12 months, the food you bought just didn't last and you didn't have money to get more.: Never true  Transportation Needs: No Transportation Needs (07/28/2023)  Received from [facility] - Transportation  In the past 12 months, has lack of transportation kept you from medical appointments or from getting medications?: No  In  the past 12 months, has lack of transportation kept you from meetings, work, or from getting things needed for daily living?: No  Physical Activity: Insufficiently Active (07/28/2023)  Received from [facility]  Exercise Vital Sign  On average, how many days per week do you engage in moderate to strenuous exercise (like a brisk walk)?: 2 days  On average, how many minutes do you engage in exercise at this level?: 20 min  Stress: Stress Concern Present (07/28/2023)  Received from [facility] - Occupational Stress Questionnaire  Do you feel stress - tense, restless, nervous, or anxious, or unable to sleep at night because your mind is troubled all the time - these days?: To some extent  Social Connections: Moderately Integrated (07/28/2023)  Received from [facility]  Social Connection and Isolation Panel  In a typical week, how many times do you talk on the phone with family, friends, or neighbors?: More than three times a week  How often do you get together with friends or relatives?: More than three times a week  How often do you attend church or religious services?: More than 4 times per year  Do you belong to any clubs or organizations such as church groups, unions, fraternal  or athletic groups, or school groups?: Yes  How often do you attend meetings of the clubs or organizations you belong to?: More than 4 times per year  Are you married, widowed, divorced, separated, never married, or living with a partner?: Divorced  Housing Stability: Unknown (01/27/2023)  Housing Stability Vital Sign  Homeless in the Last Year: No   Objective:   Vitals:  BP: (!) 163/103  Pulse: 93  Temp: 37.3 C (99.2 F)  TempSrc: Temporal  SpO2: 96%  Weight: 83.1 kg (183 lb 3.2 oz)  Height: 162.6 cm (5' 4)  PainSc: 0-No pain   Body mass index is 31.45 kg/m.  Physical Exam   GENERAL APPEARANCE Comfortable, no acute issues Development: normal Gross  deformities: none  SKIN Rash, lesions, ulcers: none Induration, erythema: none Nodules: none palpable  EYES Conjunctiva and lids: normal Pupils: equal  EARS, NOSE, MOUTH, THROAT External ears: no lesion or deformity External nose: no lesion or deformity Hearing: grossly normal  NECK Symmetric: yes Trachea: midline Thyroid : no palpable nodules in the thyroid  bed; thyroid  lobe may have a palpable nodule just beneath the head of the clavicle. There is no associated lymphadenopathy.  CHEST/CV Not assessed  ABDOMEN Not assessed  GENITOURINARY/RECTAL Not assessed  MUSCULOSKELETAL Station and gait: normal Digits and nails: no clubbing or cyanosis Muscle strength: grossly normal all extremities Deformity: none  LYMPHATIC Cervical: none palpable Supraclavicular: none palpable  PSYCHIATRIC Oriented to person, place, and time: yes Mood and affect: normal for situation Judgment and insight: appropriate for situation   Assessment and Plan:   Multiple thyroid  nodules Neoplasm of uncertain behavior of thyroid  gland  Patient is referred by her primary care physician for surgical evaluation and management of multiple thyroid  nodules with a newly diagnosed thyroid  neoplasm of uncertain behavior.  Patient provided with a copy of The Thyroid  Book: Medical and Surgical Treatment of Thyroid  Problems, published by [employer], 16 pages. Book reviewed and explained to patient during visit today.  Today we reviewed her clinical history. We reviewed her imaging studies including her recent ultrasound. We reviewed her cytopathology results. We reviewed her molecular genetic testing with AFIR[facility]. Patient has multiple thyroid  nodules. She has a DNA mutation which places her at an increased risk of malignancy at 75%. I have recommended that she undergo total thyroidectomy. Today we discussed the procedure. We discussed the size and location of the surgical incision. We discussed risk and  benefits of surgery including the risk of recurrent nerve injury and injury to parathyroid glands. We discussed the hospital stay to be anticipated. We discussed the need for lifelong thyroid  hormone replacement. We discussed the potential need for radioactive iodine treatment. The patient understands and wishes to proceed with surgery in the near future.  Patient will need to stop her anticoagulation 3 days prior to surgery. We will contact her cardiologist and request cardiology clearance prior to surgery.   Krystal Spinner, MD [facility] practice Office: [phone]

## 2023-12-08 NOTE — Progress Notes (Signed)
 Surgery orders requested via Epic inbox.

## 2023-12-08 NOTE — H&P (View-Only) (Signed)
 REFERRING PHYSICIAN: Alvia Velma Shape, DO  PROVIDER: Evangelyne Loja OZELL SPINNER, MD  Chief Complaint: New Consultation (Thyroid  neoplasm of uncertain behavior, multiple thyroid  nodules)  History of Present Illness:  Patient is referred by her primary care physician, Dr. Velma Alvia, for surgical evaluation and management of multiple thyroid  nodules with a thyroid  neoplasm of uncertain behavior. Patient had originally been diagnosed with thyroid  nodules in 2023 at which time she presented to the hospital and congestive heart failure. Patient underwent additional evaluation by her endocrinologist, Dr. Donnice Lipps. This included an ultrasound which demonstrated 2 significant nodules in the left thyroid  lobe measuring 3 cm and 1.5 cm respectively. Both of these were felt to be moderately suspicious. There is a third nodule in the right thyroid  lobe measuring 8 mm in size which is felt to be highly suspicious. Patient underwent ultrasound-guided fine-needle aspiration biopsy. The large left-sided nodule was biopsied in September 2025. This demonstrated a follicular lesion of undetermined significance, Bethesda category III. Specimen was subsequently sent for molecular genetic testing with AFIRMA. This returned as suspicious with DNA mutations placing the patient had an elevated risk of malignancy of approximately 75%. Patient is therefore referred at this time for consideration for thyroidectomy for definitive diagnosis and management. Patient has no prior history of thyroid  disease. She has never been on thyroid  medication. Her recent TSH level is normal at 1.43. Patient has had no prior head or neck surgery. There is a family history of medical thyroid  disease in the patient's mother with hypothyroidism and the patient's daughter with hyperthyroidism. There is no family history of thyroid  cancer. Patient is accompanied today by her daughter-in-law.  Patient has a significant cardiac history. She is on  chronic anticoagulation with Eliquis . She has had multiple episodes of atrial fibrillation and has undergone cardiac ablation on multiple occasions. She is followed by Dr. Ezra Shuck.  Review of Systems: A complete review of systems was obtained from the patient. I have reviewed this information and discussed as appropriate with the patient. See HPI as well for other ROS.  Review of Systems  Constitutional: Negative.  HENT: Negative.  Eyes: Negative.  Respiratory: Negative.  Cardiovascular: Positive for palpitations.  Gastrointestinal: Negative.  Genitourinary: Negative.  Musculoskeletal: Positive for joint pain.  Skin: Negative.  Neurological: Negative.  Endo/Heme/Allergies: Negative.  Psychiatric/Behavioral: Negative.    Medical History: Past Medical History:  Diagnosis Date  Atrial fibrillation (CMS/HHS-HCC)   Patient Active Problem List  Diagnosis  Acanthamoeba keratitis  AKI (acute kidney injury)  Allergic rhinitis  Anemia  Atrial fibrillation with RVR (CMS/HHS-HCC)  Bilateral impacted cerumen  Chronic diastolic heart failure (CMS/HHS-HCC)  Conjunctivitis  Cough  Diaphragmatic hernia  Esophageal reflux  Essential hypertension  Hyperglycemia  Hyperthyroidism  Hypomagnesemia  Migraine without aura  Mitral regurgitation  Multiple thyroid  nodules  Osteoarthritis  Personal history of colonic polyps  Reactive airway disease (HHS-HCC)  Recurrent nephrolithiasis  Screening for lipid disorders  Seasonal allergic rhinitis due to pollen  Secondary hypercoagulable state (HHS-HCC)  Sinobronchitis  TIA (transient ischemic attack)  Pseudophakia of right eye  Neoplasm of uncertain behavior of thyroid  gland   Past Surgical History:  Procedure Laterality Date  EXTRACTION CATARACT EXTRACAPSULAR W/INSERTION INTRAOCULAR PROSTHESIS Right 08/28/2023  Procedure: Right - EXTRACAPSULAR CATARACT PHACO REMOVAL WITH INSERTION OF INTRAOCULAR LENS PROSTHESIS (1 STAGE PROCEDURE),  MANUAL OR MECHANICAL TECHNIQUE; WITHOUT ENDOSCOPIC CYCLOPHOTOCOAGULATION; Surgeon: Jefm Veleria Sauers, MD; Location: [facility]; Service: Ophthalmology; Laterality: Right;    Allergies  Allergen Reactions  Erythromycin  Nausea  And Vomiting  Upset stomach  Nsaids (Non-Steroidal Anti-Inflammatory Drug) Other (See Comments)  Has a history of bleeding ulcers, tolerates aspirin   Penicillin Hives   Current Outpatient Medications on File Prior to Visit  Medication Sig Dispense Refill  acetaminophen  (TYLENOL ) 500 MG tablet Take 500 mg by mouth every 6 (six) hours as needed for Pain  albuterol  90 mcg/actuation inhaler Inhale 2 inhalations into the lungs every 6 (six) hours as needed for Shortness of Breath  ALPRAZolam  (XANAX ) 0.5 MG tablet Take 0.5-1 mg by mouth once daily as needed for Sleep or Anxiety  ascorbic acid, vitamin C, (VITAMIN C) 1000 MG tablet Take 1,000 mg by mouth once daily  cholecalciferol  (VITAMIN D3) 2,000 unit capsule Take 2,000 Units by mouth once daily Supplement gummies  dapagliflozin  propanediol (FARXIGA ) 10 mg tablet Take 10 mg by mouth every morning  ELIQUIS  5 mg tablet Take 5 mg by mouth 2 (two) times daily  ferrous sulfate 325 (65 FE) MG tablet Take 325 mg by mouth every other day  fluticasone  propionate (FLONASE ) 50 mcg/actuation nasal spray Place 1 spray into both nostrils once daily as needed for Allergies  gabapentin (NEURONTIN) 100 MG capsule TAKE 1 CAPSULE BY MOUTH 3 TIMES DAILY (Patient taking differently: Take 100 mg by mouth every morning) 90 capsule 0  hydrOXYzine (ATARAX) 25 MG tablet  loratadine  (CLARITIN ) 10 mg tablet Take 10 mg by mouth once daily  methIMAzole  (TAPAZOLE ) 5 MG tablet Take 5 mg by mouth every morning  metoprolol  succinate (TOPROL -XL) 100 MG XL tablet Take 100 mg by mouth at bedtime  pantoprazole  (PROTONIX ) 40 MG DR tablet Take 40 mg by mouth every morning  potassium chloride  (KLOR-CON ) 10 MEQ ER tablet Take 10 mEq by mouth once  daily Medication taken with furosemide   rosuvastatin  (CRESTOR ) 10 MG tablet Take 1 tablet by mouth every morning  spironolactone  (ALDACTONE ) 25 MG tablet Take 25 mg by mouth at bedtime (12.5 mg)-1/2 tablet taken daily  UNABLE TO FIND Take 1 tablet by mouth every morning Probiotic-floragen(3 mg)-daily  vitamin E 400 UNIT capsule Take 400 Units by mouth once daily  ZINC ORAL Take 1 tablet by mouth once daily  zinc-vit C-pyridoxine, vit B6, 12-60-0.5 mg Lozg Take 22 mg by mouth once daily  AMIOdarone  (PACERONE ) 200 MG tablet Take 200 mg by mouth once daily (Patient not taking: Reported on 08/26/2023)  chlorhexidine , preserved, 0.02 % ophthalmic solution Place 1 drop into the left eye 6 (six) times daily (Patient not taking: Reported on 11/11/2023) 10 mL 1  Compound Medication Place 1 drop into the left eye 6 (six) times daily Med Name: Voriconazole 1% ophthalmic drops- apply one drop to left eye 6 times daily (Patient not taking: Reported on 11/11/2023) 1 each 2  FUROsemide  (LASIX ) 20 MG tablet Take 20 mg by mouth once daily as needed for Edema or High Blood Pressure  miltefosine (IMPAVIDO) 50 mg Cap Take 1 capsule by mouth 3 (three) times a day (Patient not taking: Reported on 08/26/2023) 90 capsule 0  moxifloxacin  (VIGAMOX ) 0.5 % ophthalmic solution Place 1 drop into the left eye 4 (four) times daily (Patient not taking: Reported on 11/11/2023) 3 mL 3  prednisoLONE  acetate (PRED FORTE ) 1 % ophthalmic suspension Place 1 drop into the left eye 3 (three) times daily (Patient not taking: Reported on 08/26/2023) 5 mL 0  prednisolone -moxiflox-bromfen 1-0.5-0.075 % DrpS Apply 1 drop to eye 3 (three) times daily Place 1 drop into the surgical eye 3 (three) times a day for 3  weeks. Do not start until after surgery. (Patient not taking: Reported on 08/26/2023) 8 mL 1  predniSONE  (DELTASONE ) 20 MG tablet Take 1 tablet (20 mg total) by mouth 2 (two) times daily (Patient not taking: Reported on 08/26/2023) 60 tablet 0   valACYclovir (VALTREX) 1000 MG tablet TAKE 1 TABLET (1,000 MG TOTAL) BY MOUTH ONCE DAILY (Patient not taking: Reported on 11/11/2023) 31 tablet 3  valACYclovir (VALTREX) 1000 MG tablet Take 1 tablet (1,000 mg total) by mouth once daily (Patient not taking: Reported on 11/11/2023) 30 tablet 11  valACYclovir (VALTREX) 500 MG tablet Take 1 tablet (500 mg total) by mouth once daily (Patient not taking: Reported on 11/11/2023) 30 tablet 11   No current facility-administered medications on file prior to visit.   Family History  Problem Relation Age of Onset  Macular degeneration Mother  Cataracts Mother  Glaucoma Mother  Glaucoma Maternal Grandmother  Anesthesia problems Neg Hx    Social History   Tobacco Use  Smoking Status Never  Passive exposure: Past  Smokeless Tobacco Never    Social History   Socioeconomic History  Marital status: Divorced  Tobacco Use  Smoking status: Never  Passive exposure: Past  Smokeless tobacco: Never  Vaping Use  Vaping status: Never Used  Substance and Sexual Activity  Alcohol use: Never  Drug use: Never  Sexual activity: Defer   Social Drivers of Health   Financial Resource Strain: Low Risk (07/28/2023)  Received from [facility]  Overall Financial Resource Strain (CARDIA)  How hard is it for you to pay for the very basics like food, housing, medical care, and heating?: Not very hard  Food Insecurity: No Food Insecurity (07/28/2023)  Received from [facility]  Hunger Vital Sign  Within the past 12 months, you worried that your food would run out before you got the money to buy more.: Never true  Within the past 12 months, the food you bought just didn't last and you didn't have money to get more.: Never true  Transportation Needs: No Transportation Needs (07/28/2023)  Received from [facility] - Transportation  In the past 12 months, has lack of transportation kept you from medical appointments or from getting medications?: No  In  the past 12 months, has lack of transportation kept you from meetings, work, or from getting things needed for daily living?: No  Physical Activity: Insufficiently Active (07/28/2023)  Received from [facility]  Exercise Vital Sign  On average, how many days per week do you engage in moderate to strenuous exercise (like a brisk walk)?: 2 days  On average, how many minutes do you engage in exercise at this level?: 20 min  Stress: Stress Concern Present (07/28/2023)  Received from [facility] - Occupational Stress Questionnaire  Do you feel stress - tense, restless, nervous, or anxious, or unable to sleep at night because your mind is troubled all the time - these days?: To some extent  Social Connections: Moderately Integrated (07/28/2023)  Received from [facility]  Social Connection and Isolation Panel  In a typical week, how many times do you talk on the phone with family, friends, or neighbors?: More than three times a week  How often do you get together with friends or relatives?: More than three times a week  How often do you attend church or religious services?: More than 4 times per year  Do you belong to any clubs or organizations such as church groups, unions, fraternal  or athletic groups, or school groups?: Yes  How often do you attend meetings of the clubs or organizations you belong to?: More than 4 times per year  Are you married, widowed, divorced, separated, never married, or living with a partner?: Divorced  Housing Stability: Unknown (01/27/2023)  Housing Stability Vital Sign  Homeless in the Last Year: No   Objective:   Vitals:  BP: (!) 163/103  Pulse: 93  Temp: 37.3 C (99.2 F)  TempSrc: Temporal  SpO2: 96%  Weight: 83.1 kg (183 lb 3.2 oz)  Height: 162.6 cm (5' 4)  PainSc: 0-No pain   Body mass index is 31.45 kg/m.  Physical Exam   GENERAL APPEARANCE Comfortable, no acute issues Development: normal Gross  deformities: none  SKIN Rash, lesions, ulcers: none Induration, erythema: none Nodules: none palpable  EYES Conjunctiva and lids: normal Pupils: equal  EARS, NOSE, MOUTH, THROAT External ears: no lesion or deformity External nose: no lesion or deformity Hearing: grossly normal  NECK Symmetric: yes Trachea: midline Thyroid : no palpable nodules in the thyroid  bed; thyroid  lobe may have a palpable nodule just beneath the head of the clavicle. There is no associated lymphadenopathy.  CHEST/CV Not assessed  ABDOMEN Not assessed  GENITOURINARY/RECTAL Not assessed  MUSCULOSKELETAL Station and gait: normal Digits and nails: no clubbing or cyanosis Muscle strength: grossly normal all extremities Deformity: none  LYMPHATIC Cervical: none palpable Supraclavicular: none palpable  PSYCHIATRIC Oriented to person, place, and time: yes Mood and affect: normal for situation Judgment and insight: appropriate for situation   Assessment and Plan:   Multiple thyroid  nodules Neoplasm of uncertain behavior of thyroid  gland  Patient is referred by her primary care physician for surgical evaluation and management of multiple thyroid  nodules with a newly diagnosed thyroid  neoplasm of uncertain behavior.  Patient provided with a copy of The Thyroid  Book: Medical and Surgical Treatment of Thyroid  Problems, published by [employer], 16 pages. Book reviewed and explained to patient during visit today.  Today we reviewed her clinical history. We reviewed her imaging studies including her recent ultrasound. We reviewed her cytopathology results. We reviewed her molecular genetic testing with AFIRMA. Patient has multiple thyroid  nodules. She has a DNA mutation which places her at an increased risk of malignancy at 75%. I have recommended that she undergo total thyroidectomy. Today we discussed the procedure. We discussed the size and location of the surgical incision. We discussed risk and  benefits of surgery including the risk of recurrent nerve injury and injury to parathyroid glands. We discussed the hospital stay to be anticipated. We discussed the need for lifelong thyroid  hormone replacement. We discussed the potential need for radioactive iodine treatment. The patient understands and wishes to proceed with surgery in the near future.  Patient will need to stop her anticoagulation 3 days prior to surgery. We will contact her cardiologist and request cardiology clearance prior to surgery.   Krystal Spinner, MD [facility] practice Office: [phone]

## 2023-12-11 ENCOUNTER — Other Ambulatory Visit: Payer: Self-pay | Admitting: Cardiology

## 2023-12-11 DIAGNOSIS — I4819 Other persistent atrial fibrillation: Secondary | ICD-10-CM

## 2023-12-12 ENCOUNTER — Encounter (HOSPITAL_COMMUNITY): Payer: Self-pay

## 2023-12-12 NOTE — Patient Instructions (Signed)
 SURGICAL WAITING ROOM VISITATION  Patients having surgery or a procedure may have no more than 2 support people in the waiting area - these visitors may rotate.    Children under the age of 82 must have an adult with them who is not the patient.  Visitors with respiratory illnesses are discouraged from visiting and should remain at home.  If the patient needs to stay at the hospital during part of their recovery, the visitor guidelines for inpatient rooms apply. Pre-op nurse will coordinate an appropriate time for 1 support person to accompany patient in pre-op.  This support person may not rotate.    Please refer to the [facility] website for the visitor guidelines for Inpatients (after your surgery is over and you are in a regular room).       Your procedure is scheduled on: 12-22-23   Report to [facility] Main Entrance    Report to admitting at       11;05  AM   Call this number if you have problems the morning of surgery [phone]   Do not eat food :After Midnight.   After Midnight you may have the following liquids until _10:20_____ AM/  DAY OF SURGERY  then nothing by mouth  Water  Non-Citrus Juices (without pulp, NO RED-Apple, White grape, White cranberry) Black Coffee (NO MILK/CREAM OR CREAMERS, sugar ok)  Clear Tea (NO MILK/CREAM OR CREAMERS, sugar ok) regular and decaf                             Plain Jell-O (NO RED)                                           Fruit ices (not with fruit pulp, NO RED)                                     Popsicles (NO RED)                                                               Sports drinks like Gatorade (NO RED)                            If you have questions, please contact your surgeon's office.   FOLLOW ANY ADDITIONAL PRE OP INSTRUCTIONS YOU RECEIVED FROM YOUR SURGEON'S OFFICE!!!     Oral Hygiene is also important to reduce your risk of infection.                                    Remember - BRUSH YOUR  TEETH THE MORNING OF SURGERY WITH YOUR REGULAR TOOTHPASTE  DENTURES WILL BE REMOVED PRIOR TO SURGERY PLEASE DO NOT APPLY Poly grip OR ADHESIVES!!!   Do NOT smoke after Midnight   Stop all vitamins and herbal supplements 7 days before surgery.   Take these medicines the morning of surgery with A SIP OF WATER : Valtrex,  Tramadol  or tylenol  if needed, rosuvastatin , pantoprazole , methimazole , alprazolam , bring your rescue inhaler with you  NO FARXIGA  DAY OF SURGERY  HOLD ELIQUIS  PER MD INSTRUCTIONS    Bring CPAP mask and tubing day of surgery.                              You may not have any metal on your body including hair pins, jewelry, and body piercing             Do not wear make-up, lotions, powders, perfumes/cologne, or deodorant  Do not wear nail polish including gel and S&S, artificial/acrylic nails, or any other type of covering on natural nails including finger and toenails. If you have artificial nails, gel coating, etc. that needs to be removed by a nail salon please have this removed prior to surgery or surgery may need to be canceled/ delayed if the surgeon/ anesthesia feels like they are unable to be safely monitored.   Do not shave  48 hours prior to surgery.               Do not bring valuables to the hospital. [facility] IS NOT             RESPONSIBLE   FOR VALUABLES.   Contacts, glasses, dentures or bridgework may not be worn into surgery.   Bring small overnight bag day of surgery.   DO NOT BRING YOUR HOME MEDICATIONS TO THE HOSPITAL. PHARMACY WILL DISPENSE MEDICATIONS LISTED ON YOUR MEDICATION LIST TO YOU DURING YOUR ADMISSION IN THE HOSPITAL!    Patients discharged on the day of surgery will not be allowed to drive home.  Someone NEEDS to stay with you for the first 24 hours after anesthesia.   Special Instructions: Bring a copy of your healthcare power of attorney and living will documents the day of surgery if you haven't scanned them before.               Please read over the following fact sheets you were given: IF YOU HAVE QUESTIONS ABOUT YOUR PRE-OP INSTRUCTIONS PLEASE CALL [phone].    If you test positive for Covid or have been in contact with anyone that has tested positive in the last 10 days please notify you surgeon.    [facility] - Preparing for Surgery Before surgery, you can play an important role.  Because skin is not sterile, your skin needs to be as free of germs as possible.  You can reduce the number of germs on your skin by washing with CHG (chlorahexidine gluconate) soap before surgery.  CHG is an antiseptic cleaner which kills germs and bonds with the skin to continue killing germs even after washing. Please DO NOT use if you have an allergy to CHG or antibacterial soaps.  If your skin becomes reddened/irritated stop using the CHG and inform your nurse when you arrive at Short Stay. Do not shave (including legs and underarms) for at least 48 hours prior to the first CHG shower.  You may shave your face/neck.  Please follow these instructions carefully:  1.  Shower with CHG Soap the night before surgery ONLY (DO NOT USE THE SOAP THE MORNING OF SURGERY).  2.  If you choose to wash your hair, wash your hair first as usual with your normal  shampoo.  3.  After you shampoo, rinse your hair and body thoroughly to remove the shampoo.  4.  Use CHG as you would any other liquid soap.  You can apply chg directly to the skin and wash.  Gently with a scrungie or clean washcloth.  5.  Apply the CHG Soap to your body ONLY FROM THE NECK DOWN.   Do not use on face/ open                           Wound or open sores. Avoid contact with eyes, ears mouth and genitals (private parts).                       Wash face,  Genitals (private parts) with your normal soap.             6.  Wash thoroughly, paying special attention to the area where your  surgery  will be performed.  7.  Thoroughly rinse your body with warm  water  from the neck down.  8.  DO NOT shower/wash with your normal soap after using and rinsing off the CHG Soap.                9.  Pat yourself dry with a clean towel.            10.  Wear clean pajamas.            11.  Place clean sheets on your bed the night of your first shower and do not  sleep with pets. Day of Surgery : Do not apply any CHG, lotions/deodorants the morning of surgery.  Please wear clean clothes to the hospital/surgery center.  FAILURE TO FOLLOW THESE INSTRUCTIONS MAY RESULT IN THE CANCELLATION OF YOUR SURGERY  PATIENT SIGNATURE_________________________________  NURSE SIGNATURE__________________________________  ________________________________________________________________________

## 2023-12-12 NOTE — Progress Notes (Addendum)
 PCP - Velma Ku , DO LOV 11-26-23 epic Cardiologist - Josefa Beauvais, NP tele visit 11-25-23 for clearance epic  LOV Dr. Rolan 09-22-23   PPM/ICD -  Device Orders -  Rep Notified -   Chest x-ray - 10-02-21 epic EKG - 09-22-23 epic Stress Test -  ECHO - 04-17-23 epic Cardiac Cath -   Sleep Study - N/A CPAP -   Fasting Blood Sugar -  Checks Blood Sugar __N/A___ times a day  Blood Thinner Instructions:Eliquis   LAST DOSE 12-19-23 Aspirin  Instructions:  ERAS Protcol - PRE-SURGERY Ensure or G2-   FARXIGA  NONE DOS  COVID vaccine -  Activity--ABLE TO CLIMB A FLIGHT OF STAIRS WITH NO cp OR sob Anesthesia review: Afib, CHF, MVP, HTN, TIA, K+ 3.2  Patient denies shortness of breath, fever, cough and chest pain at PAT appointment   All instructions explained to the patient, with a verbal understanding of the material. Patient agrees to go over the instructions while at home for a better understanding. Patient also instructed to self quarantine after being tested for COVID-19. The opportunity to ask questions was provided.

## 2023-12-15 ENCOUNTER — Encounter (HOSPITAL_COMMUNITY): Payer: Self-pay

## 2023-12-15 ENCOUNTER — Encounter (HOSPITAL_COMMUNITY)
Admission: RE | Admit: 2023-12-15 | Discharge: 2023-12-15 | Disposition: A | Source: Ambulatory Visit | Attending: Surgery | Admitting: Surgery

## 2023-12-15 ENCOUNTER — Other Ambulatory Visit: Payer: Self-pay

## 2023-12-15 VITALS — BP 122/91 | HR 84 | Temp 97.6°F | Resp 16 | Ht 64.0 in | Wt 178.0 lb

## 2023-12-15 DIAGNOSIS — I10 Essential (primary) hypertension: Secondary | ICD-10-CM

## 2023-12-15 HISTORY — DX: Heart failure, unspecified: I50.9

## 2023-12-15 HISTORY — DX: Anxiety disorder, unspecified: F41.9

## 2023-12-15 HISTORY — DX: Pneumonia, unspecified organism: J18.9

## 2023-12-15 HISTORY — DX: Depression, unspecified: F32.A

## 2023-12-15 HISTORY — DX: Cardiac arrhythmia, unspecified: I49.9

## 2023-12-15 HISTORY — DX: Personal history of urinary calculi: Z87.442

## 2023-12-15 HISTORY — DX: Nontoxic multinodular goiter: E04.2

## 2023-12-15 LAB — CBC
HCT: 36.4 % (ref 36.0–46.0)
Hemoglobin: 12.2 g/dL (ref 12.0–15.0)
MCH: 28.8 pg (ref 26.0–34.0)
MCHC: 33.5 g/dL (ref 30.0–36.0)
MCV: 86.1 fL (ref 80.0–100.0)
Platelets: 180 K/uL (ref 150–400)
RBC: 4.23 MIL/uL (ref 3.87–5.11)
RDW: 14.4 % (ref 11.5–15.5)
WBC: 5.1 K/uL (ref 4.0–10.5)
nRBC: 0 % (ref 0.0–0.2)

## 2023-12-15 LAB — BASIC METABOLIC PANEL WITH GFR
Anion gap: 11 (ref 5–15)
BUN: 10 mg/dL (ref 8–23)
CO2: 23 mmol/L (ref 22–32)
Calcium: 9.3 mg/dL (ref 8.9–10.3)
Chloride: 108 mmol/L (ref 98–111)
Creatinine, Ser: 1.04 mg/dL — ABNORMAL HIGH (ref 0.44–1.00)
GFR, Estimated: 56 mL/min — ABNORMAL LOW (ref >60)
Glucose, Bld: 118 mg/dL — ABNORMAL HIGH (ref 70–99)
Potassium: 3.2 mmol/L — ABNORMAL LOW (ref 3.5–5.1)
Sodium: 142 mmol/L (ref 135–145)

## 2023-12-16 NOTE — Progress Notes (Signed)
 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  Last ablation was in 11/2021.  Last seen by Dr. Rolan on 09/22/2023.  Maintaining sinus rhythm.  Noted to be euvolemic.  She is on Eliquis .  CAD is medically managed.  She had cardiac clearance televisit on 11/25/2023 and was cleared for surgery:  Chart reviewed as part of pre-operative protocol coverage. Given past medical history and time since last visit, based on ACC/AHA guidelines, Anna Marquez would be at acceptable risk for the planned procedure without further cardiovascular testing.   Her RCRI is moderate risk, 6.6% risk of major cardiac event.  She is able to complete greater than 4 METS of physical activity.  Patient was advised that if she develops new symptoms prior to surgery to contact our office to arrange a follow-up appointment.  She verbalized understanding.  Patient has not had an Afib/aflutter ablation in the last 3 months, DCCV within the last 4 weeks or a watchman implanted in the last 45 days  Per office protocol, patient can hold Eliquis  for 2 days prior to procedure.   Patient will not need bridging with Lovenox  (enoxaparin ) around procedure.  History of hyperthyroidism.  She is on methimazole  and followed by endocrinology.  Scheduled for surgery above  Seen by PCP on 11/26/2023.  Blood pressure  controlled.  She reported some intermittent wheezing and has an inhaler to use as needed.  VS: BP (!) 122/91   Pulse 84   Temp 36.4 C (Oral)   Resp 16   Ht 5' 4 (1.626 m)   Wt 80.7 kg   SpO2 97%   BMI 30.55 kg/m   PROVIDERS: Alvia Bring, DO   LABS: Labs reviewed: Acceptable for surgery. (all labs ordered are listed, but only abnormal results are displayed)  Labs Reviewed  BASIC METABOLIC PANEL WITH GFR - Abnormal; Notable for the following components:      Result Value   Potassium 3.2 (*)    Glucose, Bld 118 (*)    Creatinine, Ser 1.04 (*)    GFR, Estimated 56 (*)    All other components within normal limits  CBC     EKG: 09/22/2023  Normal sinus rhythm Septal infarct, age undetermined   Echo 04/17/2023:  IMPRESSIONS    1. Left ventricular ejection fraction, by estimation, is 55 to 60%. The left ventricle has normal function. The left ventricle has no regional wall motion abnormalities. Left ventricular diastolic parameters are consistent with Grade I diastolic dysfunction (impaired relaxation).  2. Right ventricular systolic function is normal. The right ventricular size is normal. Tricuspid regurgitation signal is inadequate for assessing PA pressure.  3. The mitral valve is normal in structure. Mild mitral valve regurgitation. No evidence of mitral stenosis.  4. The aortic valve is tricuspid. There is mild calcification  of the aortic valve. Aortic valve regurgitation is mild. No aortic stenosis is present.  5. The inferior vena cava is normal in size with greater than 50% respiratory variability, suggesting right atrial pressure of 3 mmHg.  Comparison(s): No significant change from prior study.  Cardiac CT 11/16/2021:  IMPRESSION: 1. There is normal pulmonary vein drainage into the left atrium. Measurements as reported   2. There is no thrombus in the left atrial appendage.   3. The esophagus courses posterior to ostium of left sided  pulmonary veins   4. Coronary calcium  score 134 (72nd percentile)  Past Medical History:  Diagnosis Date   Adenomatous colon polyp 2003   Allergy    seasonal   Anxiety    Asthma    seasonal   Cataract    CHF (congestive heart failure) (HCC)    Cholelithiasis    COVID    Depression    Diverticulosis of colon    Dysrhythmia    Afib   Gastric polyps    GERD (gastroesophageal reflux disease)    Helicobacter pylori (H. pylori)    Hiatal hernia    History of kidney stones    x8   Hypertension    Mitral valve prolapse    Multiple thyroid  nodules    Osteoarthritis    Osteopenia 03/2017   T score -1.5 FRAX 8.9% / 0.9%.  Stable from prior DEXA 2016   Persistent atrial fibrillation (HCC)    Pneumonia    Squamous cell carcinoma of skin 12/12/2022   Left lower leg anterior (KA type) - Mohs 01/06/2023   Squamous cell carcinoma of skin 01/06/2023   Left lower leg near ankle (KA type) - needs Mohs   Squamous cell carcinoma of skin 06/26/2023   Well differentiated. Right thigh- anterior (appt 08/21/2023)   TGA (transient global amnesia)    Thyroid  disease 06/09/21    Past Surgical History:  Procedure Laterality Date   ABDOMINAL HYSTERECTOMY     partial   ANTERIOR AND POSTERIOR REPAIR N/A 06/14/2014   Procedure: ANTERIOR (CYSTOCELE) AND POSTERIOR REPAIR (RECTOCELE);  Surgeon: Evalene SHAUNNA Organ, MD;  Location: [facility];  Service: Gynecology;  Laterality: N/A;   ATRIAL FIBRILLATION ABLATION N/A 02/11/2019   Procedure: ATRIAL FIBRILLATION ABLATION;  Surgeon: Kelsie Agent, MD;  Location: [facility];  Service: Cardiovascular;  Laterality: N/A;   ATRIAL FIBRILLATION ABLATION N/A 11/23/2021   Procedure: ATRIAL FIBRILLATION ABLATION;  Surgeon: Inocencio Soyla Lunger, MD;  Location: [facility];  Service: Cardiovascular;  Laterality: N/A;   CARDIOVERSION N/A 01/15/2019   Procedure: CARDIOVERSION;  Surgeon: Okey Vina GAILS, MD;  Location: [facility] ENDOSCOPY;  Service: Cardiovascular;   Laterality: N/A;x17   CARDIOVERSION N/A 07/09/2021   Procedure: CARDIOVERSION;  Surgeon: Rolan Ezra RAMAN, MD;  Location: [facility] ENDOSCOPY;  Service: Cardiovascular;  Laterality: N/A;   CHOLECYSTECTOMY     EYE SURGERY Right    cataract   kidney stone removal     x2   VAGINAL HYSTERECTOMY N/A 06/14/2014   Procedure: HYSTERECTOMY VAGINAL;  Surgeon: Evalene SHAUNNA Organ, MD;  Location: [facility];  Service: Gynecology;  Laterality: N/A;    MEDICATIONS:  acetaminophen  (TYLENOL ) 500 MG tablet   albuterol  (VENTOLIN  HFA) 108 (90 Base) MCG/ACT inhaler   ALPRAZolam  (XANAX ) 0.5 MG tablet   Ascorbic Acid (VITAMIN C) 1000 MG tablet   Cholecalciferol  (VITAMIN D ) 50 MCG (2000 UT) tablet   diclofenac Sodium (VOLTAREN) 1 % GEL   ELIQUIS  5 MG TABS tablet   FARXIGA   10 MG TABS tablet   ferrous sulfate 325 (65 FE) MG tablet   fluticasone  (FLONASE ) 50 MCG/ACT nasal spray   furosemide  (LASIX ) 20 MG tablet   loratadine  (CLARITIN ) 10 MG tablet   methimazole  (TAPAZOLE ) 5 MG tablet   metoprolol  succinate (TOPROL -XL) 100 MG 24 hr tablet   pantoprazole  (PROTONIX ) 40 MG tablet   Polyethyl Glycol-Propyl Glycol (SYSTANE) 0.4-0.3 % SOLN   potassium chloride  (KLOR-CON ) 10 MEQ tablet   Probiotic Product (PROBIOTIC PO)   rosuvastatin  (CRESTOR ) 10 MG tablet   spironolactone  (ALDACTONE ) 25 MG tablet   traMADol  (ULTRAM ) 50 MG tablet   traZODone  (DESYREL ) 50 MG tablet   valACYclovir (VALTREX) 500 MG tablet   vitamin E 180 MG (400 UNITS) capsule   ZINC CITRATE PO   No current facility-administered medications for this encounter.    Burnard CHRISTELLA Odis DEVONNA [facility] Short Stay/Anesthesiology [facility] Phone [phone] 12/16/2023 11:58 AM

## 2023-12-20 ENCOUNTER — Encounter (HOSPITAL_COMMUNITY): Payer: Self-pay | Admitting: Surgery

## 2023-12-22 ENCOUNTER — Ambulatory Visit (HOSPITAL_COMMUNITY): Payer: Self-pay

## 2023-12-22 ENCOUNTER — Encounter (HOSPITAL_COMMUNITY): Payer: Self-pay | Admitting: Surgery

## 2023-12-22 ENCOUNTER — Encounter (HOSPITAL_COMMUNITY): Admission: RE | Disposition: A | Payer: Self-pay | Source: Home / Self Care | Attending: Surgery

## 2023-12-22 ENCOUNTER — Other Ambulatory Visit: Payer: Self-pay

## 2023-12-22 ENCOUNTER — Ambulatory Visit (HOSPITAL_COMMUNITY): Admission: RE | Admit: 2023-12-22 | Discharge: 2023-12-23 | Disposition: A | Attending: Surgery | Admitting: Surgery

## 2023-12-22 ENCOUNTER — Encounter (HOSPITAL_COMMUNITY): Payer: Self-pay | Admitting: Medical

## 2023-12-22 DIAGNOSIS — K449 Diaphragmatic hernia without obstruction or gangrene: Secondary | ICD-10-CM | POA: Insufficient documentation

## 2023-12-22 DIAGNOSIS — I4891 Unspecified atrial fibrillation: Secondary | ICD-10-CM | POA: Diagnosis not present

## 2023-12-22 DIAGNOSIS — Z7901 Long term (current) use of anticoagulants: Secondary | ICD-10-CM | POA: Insufficient documentation

## 2023-12-22 DIAGNOSIS — I4819 Other persistent atrial fibrillation: Secondary | ICD-10-CM | POA: Diagnosis not present

## 2023-12-22 DIAGNOSIS — F418 Other specified anxiety disorders: Secondary | ICD-10-CM | POA: Insufficient documentation

## 2023-12-22 DIAGNOSIS — Z8349 Family history of other endocrine, nutritional and metabolic diseases: Secondary | ICD-10-CM | POA: Diagnosis not present

## 2023-12-22 DIAGNOSIS — C73 Malignant neoplasm of thyroid gland: Secondary | ICD-10-CM | POA: Diagnosis not present

## 2023-12-22 DIAGNOSIS — I5032 Chronic diastolic (congestive) heart failure: Secondary | ICD-10-CM | POA: Insufficient documentation

## 2023-12-22 DIAGNOSIS — K219 Gastro-esophageal reflux disease without esophagitis: Secondary | ICD-10-CM | POA: Diagnosis not present

## 2023-12-22 DIAGNOSIS — D440 Neoplasm of uncertain behavior of thyroid gland: Secondary | ICD-10-CM | POA: Diagnosis not present

## 2023-12-22 DIAGNOSIS — I110 Hypertensive heart disease with heart failure: Secondary | ICD-10-CM | POA: Diagnosis not present

## 2023-12-22 DIAGNOSIS — E042 Nontoxic multinodular goiter: Secondary | ICD-10-CM | POA: Diagnosis present

## 2023-12-22 DIAGNOSIS — Z1509 Genetic susceptibility to other malignant neoplasm: Secondary | ICD-10-CM | POA: Diagnosis not present

## 2023-12-22 HISTORY — PX: THYROIDECTOMY: SHX17

## 2023-12-22 SURGERY — THYROIDECTOMY
Anesthesia: General | Site: Neck

## 2023-12-22 MED ORDER — FENTANYL CITRATE (PF) 50 MCG/ML IJ SOSY: 25.0000 ug | PREFILLED_SYRINGE | INTRAMUSCULAR | Status: DC | PRN

## 2023-12-22 MED ORDER — 0.9 % SODIUM CHLORIDE (POUR BTL) OPTIME
TOPICAL | Status: DC | PRN
  Administered 2023-12-22: 14:00:00 1000 mL

## 2023-12-22 MED ORDER — ALBUTEROL SULFATE (2.5 MG/3ML) 0.083% IN NEBU: 3.0000 mL | INHALATION_SOLUTION | Freq: Four times a day (QID) | RESPIRATORY_TRACT | Status: DC | PRN

## 2023-12-22 MED ORDER — ACETAMINOPHEN 10 MG/ML IV SOLN: 1000.0000 mg | Freq: Once | INTRAVENOUS | Status: DC | PRN

## 2023-12-22 MED ORDER — PANTOPRAZOLE SODIUM 40 MG PO TBEC
40.0000 mg | DELAYED_RELEASE_TABLET | Freq: Every day | ORAL | Status: DC
  Administered 2023-12-22 – 2023-12-23 (×2): 40 mg via ORAL
  Filled 2023-12-22 (×2): qty 1

## 2023-12-22 MED ORDER — TRAMADOL HCL 50 MG PO TABS
50.0000 mg | ORAL_TABLET | Freq: Four times a day (QID) | ORAL | Status: DC | PRN
  Administered 2023-12-22: 21:00:00 50 mg via ORAL
  Filled 2023-12-22 (×2): qty 1

## 2023-12-22 MED ORDER — DROPERIDOL 2.5 MG/ML IJ SOLN: 0.6250 mg | Freq: Once | INTRAMUSCULAR | Status: DC | PRN

## 2023-12-22 MED ORDER — FENTANYL CITRATE (PF) 100 MCG/2ML IJ SOLN
INTRAMUSCULAR | Status: DC | PRN
  Administered 2023-12-22: 14:00:00 100 ug via INTRAVENOUS
  Administered 2023-12-22 (×2): 50 ug via INTRAVENOUS

## 2023-12-22 MED ORDER — CIPROFLOXACIN IN D5W 400 MG/200ML IV SOLN
400.0000 mg | INTRAVENOUS | Status: AC
  Administered 2023-12-22: 14:00:00 400 mg via INTRAVENOUS

## 2023-12-22 MED ORDER — ACETAMINOPHEN 325 MG PO TABS: 650.0000 mg | ORAL_TABLET | Freq: Four times a day (QID) | ORAL | Status: DC | PRN

## 2023-12-22 MED ORDER — CHLORHEXIDINE GLUCONATE CLOTH 2 % EX PADS: 6.0000 | MEDICATED_PAD | Freq: Once | CUTANEOUS | Status: DC

## 2023-12-22 MED ORDER — ORAL CARE MOUTH RINSE: 15.0000 mL | Freq: Once | OROMUCOSAL | Status: AC

## 2023-12-22 MED ORDER — EPHEDRINE 5 MG/ML INJ
INTRAVENOUS | Status: AC
  Filled 2023-12-22: qty 5

## 2023-12-22 MED ORDER — ONDANSETRON 4 MG PO TBDP: 4.0000 mg | ORAL_TABLET | Freq: Four times a day (QID) | ORAL | Status: DC | PRN

## 2023-12-22 MED ORDER — SUGAMMADEX SODIUM 200 MG/2ML IV SOLN
INTRAVENOUS | Status: AC
  Filled 2023-12-22: qty 2

## 2023-12-22 MED ORDER — CALCIUM CARBONATE 1250 (500 CA) MG PO TABS
2.0000 | ORAL_TABLET | Freq: Three times a day (TID) | ORAL | Status: DC
  Filled 2023-12-22 (×2): qty 2

## 2023-12-22 MED ORDER — LACTATED RINGERS IV SOLN: INTRAVENOUS | Status: DC

## 2023-12-22 MED ORDER — ALPRAZOLAM 0.5 MG PO TABS
0.5000 mg | ORAL_TABLET | Freq: Every morning | ORAL | Status: DC
  Administered 2023-12-23: 09:00:00 0.5 mg via ORAL
  Filled 2023-12-22: qty 1

## 2023-12-22 MED ORDER — MIDAZOLAM HCL 2 MG/2ML IJ SOLN
INTRAMUSCULAR | Status: AC
  Filled 2023-12-22: qty 2

## 2023-12-22 MED ORDER — PROPOFOL 10 MG/ML IV BOLUS
INTRAVENOUS | Status: AC
  Filled 2023-12-22: qty 20

## 2023-12-22 MED ORDER — SPIRONOLACTONE 12.5 MG HALF TABLET
12.5000 mg | ORAL_TABLET | Freq: Every day | ORAL | Status: DC
  Administered 2023-12-22: 21:00:00 12.5 mg via ORAL
  Filled 2023-12-22 (×4): qty 1

## 2023-12-22 MED ORDER — CIPROFLOXACIN IN D5W 400 MG/200ML IV SOLN
INTRAVENOUS | Status: AC
  Filled 2023-12-22: qty 200

## 2023-12-22 MED ORDER — ONDANSETRON HCL 4 MG/2ML IJ SOLN
4.0000 mg | Freq: Four times a day (QID) | INTRAMUSCULAR | Status: DC | PRN
  Administered 2023-12-22 – 2023-12-23 (×2): 4 mg via INTRAVENOUS
  Filled 2023-12-22 (×2): qty 2

## 2023-12-22 MED ORDER — HEMOSTATIC AGENTS (NO CHARGE) OPTIME
TOPICAL | Status: DC | PRN
  Administered 2023-12-22: 15:00:00 1 via TOPICAL

## 2023-12-22 MED ORDER — SODIUM CHLORIDE 0.45 % IV SOLN: INTRAVENOUS | Status: DC

## 2023-12-22 MED ORDER — BUPIVACAINE HCL (PF) 0.25 % IJ SOLN
INTRAMUSCULAR | Status: AC
  Filled 2023-12-22: qty 30

## 2023-12-22 MED ORDER — SUGAMMADEX SODIUM 200 MG/2ML IV SOLN
INTRAVENOUS | Status: DC | PRN
  Administered 2023-12-22: 16:00:00 200 mg via INTRAVENOUS

## 2023-12-22 MED ORDER — HYDROMORPHONE HCL 1 MG/ML IJ SOLN
1.0000 mg | INTRAMUSCULAR | Status: DC | PRN
  Administered 2023-12-22: 19:00:00 1 mg via INTRAVENOUS
  Filled 2023-12-22: qty 1

## 2023-12-22 MED ORDER — ROCURONIUM BROMIDE 10 MG/ML (PF) SYRINGE
PREFILLED_SYRINGE | INTRAVENOUS | Status: DC | PRN
  Administered 2023-12-22: 14:00:00 20 mg via INTRAVENOUS
  Administered 2023-12-22: 15:00:00 10 mg via INTRAVENOUS
  Administered 2023-12-22: 14:00:00 50 mg via INTRAVENOUS

## 2023-12-22 MED ORDER — FENTANYL CITRATE (PF) 100 MCG/2ML IJ SOLN
INTRAMUSCULAR | Status: AC
  Filled 2023-12-22: qty 2

## 2023-12-22 MED ORDER — ONDANSETRON HCL 4 MG/2ML IJ SOLN
INTRAMUSCULAR | Status: DC | PRN
  Administered 2023-12-22: 15:00:00 4 mg via INTRAVENOUS

## 2023-12-22 MED ORDER — METOPROLOL SUCCINATE ER 50 MG PO TB24
100.0000 mg | ORAL_TABLET | Freq: Every day | ORAL | Status: DC
  Administered 2023-12-22: 18:00:00 100 mg via ORAL
  Filled 2023-12-22 (×2): qty 2

## 2023-12-22 MED ORDER — LIDOCAINE HCL (PF) 2 % IJ SOLN
INTRAMUSCULAR | Status: DC | PRN
  Administered 2023-12-22: 14:00:00 100 mg via INTRADERMAL

## 2023-12-22 MED ORDER — LIDOCAINE HCL (PF) 2 % IJ SOLN
INTRAMUSCULAR | Status: AC
  Filled 2023-12-22: qty 5

## 2023-12-22 MED ORDER — ACETAMINOPHEN 650 MG RE SUPP: 650.0000 mg | Freq: Four times a day (QID) | RECTAL | Status: DC | PRN

## 2023-12-22 MED ORDER — CHLORHEXIDINE GLUCONATE 0.12 % MT SOLN
15.0000 mL | Freq: Once | OROMUCOSAL | Status: AC
  Administered 2023-12-22: 11:00:00 15 mL via OROMUCOSAL

## 2023-12-22 MED ORDER — DEXAMETHASONE SOD PHOSPHATE PF 10 MG/ML IJ SOLN
INTRAMUSCULAR | Status: DC | PRN
  Administered 2023-12-22: 14:00:00 5 mg via INTRAVENOUS

## 2023-12-22 MED ORDER — MIDAZOLAM HCL 5 MG/5ML IJ SOLN
INTRAMUSCULAR | Status: DC | PRN
  Administered 2023-12-22: 14:00:00 2 mg via INTRAVENOUS

## 2023-12-22 MED ORDER — PROPOFOL 10 MG/ML IV BOLUS
INTRAVENOUS | Status: DC | PRN
  Administered 2023-12-22: 14:00:00 130 mg via INTRAVENOUS

## 2023-12-22 MED ORDER — ONDANSETRON HCL 4 MG/2ML IJ SOLN
INTRAMUSCULAR | Status: AC
  Filled 2023-12-22: qty 2

## 2023-12-22 MED ORDER — DAPAGLIFLOZIN PROPANEDIOL 10 MG PO TABS
10.0000 mg | ORAL_TABLET | Freq: Every day | ORAL | Status: DC
  Filled 2023-12-22: qty 1

## 2023-12-22 MED ORDER — ROCURONIUM BROMIDE 10 MG/ML (PF) SYRINGE
PREFILLED_SYRINGE | INTRAVENOUS | Status: AC
  Filled 2023-12-22: qty 10

## 2023-12-22 MED ORDER — OXYCODONE HCL 5 MG PO TABS
5.0000 mg | ORAL_TABLET | ORAL | Status: DC | PRN
  Administered 2023-12-22 – 2023-12-23 (×2): 5 mg via ORAL
  Administered 2023-12-23: 03:00:00 10 mg via ORAL
  Filled 2023-12-22 (×2): qty 1
  Filled 2023-12-22: qty 2

## 2023-12-22 SURGICAL SUPPLY — 28 items
BAG COUNTER SPONGE SURGICOUNT (BAG) ×1 IMPLANT
BLADE SURG 15 STRL LF DISP TIS (BLADE) ×1 IMPLANT
CHLORAPREP W/TINT 26 (MISCELLANEOUS) ×1 IMPLANT
CLIP TI MEDIUM 6 (CLIP) ×2 IMPLANT
CLIP TI WIDE RED SMALL 6 (CLIP) ×2 IMPLANT
COVER SURGICAL LIGHT HANDLE (MISCELLANEOUS) ×1 IMPLANT
DERMABOND ADVANCED .7 DNX12 (GAUZE/BANDAGES/DRESSINGS) ×1 IMPLANT
DRAPE LAPAROTOMY T 98X78 PEDS (DRAPES) ×1 IMPLANT
DRAPE UTILITY XL STRL (DRAPES) ×1 IMPLANT
ELECT PENCIL ROCKER SW 15FT (MISCELLANEOUS) ×1 IMPLANT
ELECT REM PT RETURN 15FT ADLT (MISCELLANEOUS) ×1 IMPLANT
GAUZE 4X4 16PLY ~~LOC~~+RFID DBL (SPONGE) ×1 IMPLANT
GLOVE SURG ORTHO 8.0 STRL STRW (GLOVE) ×1 IMPLANT
GOWN STRL REUS W/ TWL XL LVL3 (GOWN DISPOSABLE) ×2 IMPLANT
HEMOSTAT SURGICEL 2X4 FIBR (HEMOSTASIS) ×1 IMPLANT
ILLUMINATOR WAVEGUIDE N/F (MISCELLANEOUS) ×1 IMPLANT
KIT BASIN OR (CUSTOM PROCEDURE TRAY) ×1 IMPLANT
KIT TURNOVER KIT A (KITS) ×1 IMPLANT
PACK BASIC VI WITH GOWN DISP (CUSTOM PROCEDURE TRAY) ×1 IMPLANT
PAD MAGNETIC INSTR ST 16X20 (MISCELLANEOUS) ×1 IMPLANT
SHEARS HARMONIC 9CM CVD (BLADE) ×1 IMPLANT
SUT MNCRL AB 4-0 PS2 18 (SUTURE) ×1 IMPLANT
SUT PROLENE 3 0 PS 2 (SUTURE) IMPLANT
SUT SILK 3 0 SH 30 (SUTURE) ×1 IMPLANT
SUT VIC AB 3-0 SH 18 (SUTURE) ×2 IMPLANT
SYR BULB IRRIG 60ML STRL (SYRINGE) ×1 IMPLANT
TOWEL OR DSP ST BLU DLX 10/PK (DISPOSABLE) ×1 IMPLANT
TUBING CONNECTING 10 (TUBING) ×1 IMPLANT

## 2023-12-22 NOTE — Interval H&P Note (Signed)
 History and Physical Interval Note:  12/22/2023 1:27 PM  Anna Marquez  has presented today for surgery, with the diagnosis of MULTIPLE THYROID  NODULES  NEOPLASM OF UNCERTAIN BEHAVIOR OF THYROID  GLAND.  The various methods of treatment have been discussed with the patient and family. After consideration of risks, benefits and other options for treatment, the patient has consented to    Procedures with comments: THYROIDECTOMY (N/A) - TOTAL THYROIDECTOMY as a surgical intervention.    The patient's history has been reviewed, patient examined, no change in status, stable for surgery.  I have reviewed the patient's chart and labs.  Questions were answered to the patient's satisfaction.    Krystal Spinner, MD [facility] practice Office: [phone]   Krystal Spinner

## 2023-12-22 NOTE — Transfer of Care (Signed)
 Immediate Anesthesia Transfer of Care Note  Patient: Anna Marquez  Procedure(s) Performed: THYROIDECTOMY (Neck)  Patient Location: PACU  Anesthesia Type:General  Level of Consciousness: sedated  Airway & Oxygen Therapy: Patient Spontanous Breathing and Patient connected to face mask oxygen  Post-op Assessment: Report given to RN and Post -op Vital signs reviewed and stable  Post vital signs: Reviewed and stable  Last Vitals:  Vitals Value Taken Time  BP    Temp    Pulse 82 12/22/23 16:15  Resp 19 12/22/23 16:15  SpO2 94 % 12/22/23 16:15  Vitals shown include unfiled device data.  Last Pain:  Vitals:   12/22/23 1113  TempSrc: Oral  PainSc: 0-No pain         Complications: No notable events documented.

## 2023-12-22 NOTE — Anesthesia Postprocedure Evaluation (Signed)
 Anesthesia Post Note  Patient: Anna Marquez  Procedure(s) Performed: THYROIDECTOMY (Neck)     Patient location during evaluation: PACU Anesthesia Type: General Level of consciousness: awake Pain management: pain level controlled Vital Signs Assessment: post-procedure vital signs reviewed and stable Respiratory status: spontaneous breathing, nonlabored ventilation and respiratory function stable Cardiovascular status: blood pressure returned to baseline and stable Postop Assessment: no apparent nausea or vomiting Anesthetic complications: no   No notable events documented.  Last Vitals:  Vitals:   12/22/23 1645 12/22/23 1723  BP: (!) 159/92 (!) 166/92  Pulse: 81 85  Resp: 18 18  Temp: (!) 36.1 C (!) 36.3 C  SpO2: 94% 93%    Last Pain:  Vitals:   12/22/23 1847  TempSrc:   PainSc: 10-Worst pain ever                 Teiana Hajduk P Damere Brandenburg

## 2023-12-22 NOTE — Anesthesia Preprocedure Evaluation (Signed)
 Anesthesia Evaluation  Patient identified by MRN, date of birth, ID band Patient awake    Reviewed: Allergy & Precautions, NPO status , Patient's Chart, lab work & pertinent test results  Airway Mallampati: II  TM Distance: >3 FB Neck ROM: Full    Dental no notable dental hx.    Pulmonary asthma    Pulmonary exam normal        Cardiovascular hypertension, Pt. on medications and Pt. on home beta blockers +CHF  + dysrhythmias Atrial Fibrillation  Rhythm:Irregular Rate:Normal  IMPRESSIONS   1. Left ventricular ejection fraction, by estimation, is 55 to 60%. The  left ventricle has normal function. The left ventricle has no regional  wall motion abnormalities. Left ventricular diastolic parameters are  consistent with Grade I diastolic  dysfunction (impaired relaxation).   2. Right ventricular systolic function is normal. The right ventricular  size is normal. Tricuspid regurgitation signal is inadequate for assessing  PA pressure.   3. The mitral valve is normal in structure. Mild mitral valve  regurgitation. No evidence of mitral stenosis.   4. The aortic valve is tricuspid. There is mild calcification of the  aortic valve. Aortic valve regurgitation is mild. No aortic stenosis is  present.   5. The inferior vena cava is normal in size with greater than 50%  respiratory variability, suggesting right atrial pressure of 3 mmHg.   Comparison(s): No significant change from prior study.     Neuro/Psych  Headaches  Anxiety Depression    TIA   GI/Hepatic Neg liver ROS, hiatal hernia,GERD  Medicated,,  Endo/Other   Hyperthyroidism   Renal/GU Renal disease  negative genitourinary   Musculoskeletal  (+) Arthritis ,    Abdominal Normal abdominal exam  (+)   Peds  Hematology  (+) Blood dyscrasia, anemia Lab Results      Component                Value               Date                      WBC                      5.1                  12/15/2023                HGB                      12.2                12/15/2023                HCT                      36.4                12/15/2023                MCV                      86.1                12/15/2023                PLT  180                 12/15/2023             Lab Results      Component                Value               Date                      NA                       142                 12/15/2023                K                        3.2 (L)             12/15/2023                CO2                      23                  12/15/2023                GLUCOSE                  118 (H)             12/15/2023                BUN                      10                  12/15/2023                CREATININE               1.04 (H)            12/15/2023                CALCIUM                   9.3                 12/15/2023                GFR                      56.81 (L)           11/11/2018                EGFR                     43 (L)              08/28/2022                GFRNONAA                 56 (L)              12/15/2023  Anesthesia Other Findings   Reproductive/Obstetrics                              Anesthesia Physical Anesthesia Plan  ASA: 3  Anesthesia Plan: General   Post-op Pain Management:    Induction: Intravenous  PONV Risk Score and Plan: 3 and Ondansetron , Dexamethasone  and Treatment may vary due to age or medical condition  Airway Management Planned: Mask and Oral ETT  Additional Equipment: None  Intra-op Plan:   Post-operative Plan: Extubation in OR  Informed Consent: I have reviewed the patients History and Physical, chart, labs and discussed the procedure including the risks, benefits and alternatives for the proposed anesthesia with the patient or authorized representative who has indicated his/her understanding and acceptance.     Dental advisory  given  Plan Discussed with: CRNA  Anesthesia Plan Comments:         Anesthesia Quick Evaluation

## 2023-12-22 NOTE — Op Note (Signed)
 Procedure Note  Pre-operative Diagnosis:  multiple thyroid  nodules, thyroid  neoplasm of uncertain behavior  Post-operative Diagnosis:  same  Surgeon:  Krystal Spinner, MD  Assistant:  none   Procedure:  Total thyroidectomy with resection of right substernal goiter; reimplantation of parathyroid tissue to right sternocleidomastoid muscle  Anesthesia:  General  Estimated Blood Loss:  50 cc  Drains: none         Specimen: thyroid  to pathology  Indications:  Patient is referred by her primary care physician, Dr. Velma Ku, for surgical evaluation and management of multiple thyroid  nodules with a thyroid  neoplasm of uncertain behavior. Patient had originally been diagnosed with thyroid  nodules in 2023 at which time she presented to the hospital and congestive heart failure. Patient underwent additional evaluation by her endocrinologist, Dr. Donnice Lipps. This included an ultrasound which demonstrated 2 significant nodules in the left thyroid  lobe measuring 3 cm and 1.5 cm respectively. Both of these were felt to be moderately suspicious. There is a third nodule in the right thyroid  lobe measuring 8 mm in size which is felt to be highly suspicious. Patient underwent ultrasound-guided fine-needle aspiration biopsy. The large left-sided nodule was biopsied in September 2025. This demonstrated a follicular lesion of undetermined significance, Bethesda category III. Specimen was subsequently sent for molecular genetic testing with AFIRMA. This returned as suspicious with DNA mutations placing the patient had an elevated risk of malignancy of approximately 75%. Patient is therefore referred at this time for consideration for thyroidectomy for definitive diagnosis and management. Patient has no prior history of thyroid  disease. She has never been on thyroid  medication. Her recent TSH level is normal at 1.43. Patient has had no prior head or neck surgery. There is a family history of medical thyroid  disease  in the patient's mother with hypothyroidism and the patient's daughter with hyperthyroidism. There is no family history of thyroid  cancer. Patient is accompanied today by her daughter-in-law.  Procedure Details: Procedure was done in OR #1 at the [facility]. The patient was brought to the operating room and placed in a supine position on the operating room table. Following administration of general anesthesia, the patient was positioned and then prepped and draped in the usual aseptic fashion. After ascertaining that an adequate level of anesthesia had been achieved, a small Kocher incision was made with #15 blade. Dissection was carried through subcutaneous tissues and platysma.Hemostasis was achieved with the electrocautery. Skin flaps were elevated cephalad and caudad from the thyroid  notch to the sternal notch. A Mahorner self-retaining retractor was placed for exposure. Strap muscles were incised in the midline and dissection was begun on the left side.  Strap muscles were reflected laterally.  Left thyroid  lobe was mildly enlarged and mutlinodular.  The left lobe was gently mobilized with blunt dissection. Superior pole vessels were dissected out and divided individually between small and medium ligaclips with the harmonic scalpel. The thyroid  lobe was rolled anteriorly. Branches of the inferior thyroid  artery were divided between small ligaclips with the harmonic scalpel. Inferior venous tributaries were divided between ligaclips. Both the superior and inferior parathyroid glands were identified and preserved on their vascular pedicles. The recurrent laryngeal nerve was identified and preserved along its course. The ligament of Court was released with the electrocautery and the gland was mobilized onto the anterior trachea. Isthmus was mobilized across the midline. There was no significant pyramidal lobe present. Dry pack was placed in the left neck.  The right thyroid  lobe was gently mobilized  with blunt dissection.  Right thyroid  lobe was moderately enlarged and multinodular.  It extended inferiorly and into the midline into the upper anterior mediastinum. Superior pole vessels were dissected out and divided between small and medium ligaclips with the Harmonic scalpel. Superior parathyroid was identified and preserved. Lateral venous tributaries were divided between medium ligaclips with the harmonic scalpel. The right thyroid  lobe was rolled anteriorly and the branches of the inferior thyroid  artery divided between small ligaclips. The right recurrent laryngeal nerve was identified and preserved along its course. The ligament of Court was released with the electrocautery. The right thyroid  lobe was mobilized onto the anterior trachea.  The remainder of the thyroid  was dissected off the anterior trachea extending down into the anterior mediastinum to the level of the subclavian artery. The thyroid  was completely excised. A suture was used to mark the right lobe. The entire thyroid  gland was submitted to pathology for review.  Palpation of the operative field demonstrated no evidence of residual disease and no abnormal lymph nodes.  However, the inferior parathyroid gland on the left appeared to be ischemic and without sufficient blood supply.  It was resected.  The gland was then morcellated and placed into a pocket in the clavicular head of the right sternocleidomastoid muscle.  The muscle was closed over the tissue with a figure-of-eight 3-0 Prolene suture.  The neck was irrigated with warm saline. Fibrillar was placed throughout the operative field. Strap muscles were approximated in the midline with interrupted 3-0 Vicryl sutures. Platysma was closed with interrupted 3-0 Vicryl sutures. Skin was closed with a running 4-0 Monocryl subcuticular suture. Wound was washed and Dermabond was applied. The patient was awakened from anesthesia and brought to the recovery room. The patient tolerated the  procedure well.   Krystal Spinner, MD [facility] Office: [phone]

## 2023-12-22 NOTE — Anesthesia Procedure Notes (Signed)
 Procedure Name: Intubation Date/Time: 12/22/2023 2:01 PM  Performed by: Morton Houston, RNPre-anesthesia Checklist: Patient identified, Emergency Drugs available, Suction available and Patient being monitored Patient Re-evaluated:Patient Re-evaluated prior to induction Oxygen Delivery Method: Circle System Utilized Preoxygenation: Pre-oxygenation with 100% oxygen Induction Type: IV induction Ventilation: Mask ventilation without difficulty Laryngoscope Size: Mac and 3 Grade View: Grade I Tube type: Oral Tube size: 7.0 mm Number of attempts: 1 Airway Equipment and Method: Stylet Placement Confirmation: ETT inserted through vocal cords under direct vision, positive ETCO2 and breath sounds checked- equal and bilateral Secured at: 22 cm Tube secured with: Tape Dental Injury: Teeth and Oropharynx as per pre-operative assessment

## 2023-12-23 ENCOUNTER — Encounter (HOSPITAL_COMMUNITY): Payer: Self-pay | Admitting: Surgery

## 2023-12-23 DIAGNOSIS — C73 Malignant neoplasm of thyroid gland: Secondary | ICD-10-CM | POA: Diagnosis not present

## 2023-12-23 LAB — CALCIUM: Calcium: 8.8 mg/dL — ABNORMAL LOW (ref 8.9–10.3)

## 2023-12-23 MED ORDER — CALCIUM CARBONATE ANTACID 500 MG PO CHEW: 2.0000 | CHEWABLE_TABLET | Freq: Two times a day (BID) | ORAL | 1 refills | Status: DC

## 2023-12-23 MED ORDER — LEVOTHYROXINE SODIUM 100 MCG PO TABS: 100.0000 ug | ORAL_TABLET | Freq: Every day | ORAL | 3 refills | Status: DC

## 2023-12-23 MED ORDER — OXYCODONE HCL 5 MG PO TABS: 5.0000 mg | ORAL_TABLET | Freq: Four times a day (QID) | ORAL | 0 refills | Status: AC | PRN

## 2023-12-23 NOTE — Discharge Instructions (Signed)
 [facility] - Dr. Darnell Level  THYROID & PARATHYROID SURGERY:  POST-OP INSTRUCTIONS  Always review the instruction sheet provided by the hospital nurse at discharge.  A prescription for pain medication may be sent to your pharmacy at the time of discharge.  Take your pain medication as prescribed.  If narcotic pain medicine is not needed, then you may take acetaminophen (Tylenol) or ibuprofen (Advil) as needed for pain or soreness.  Take your normal home medications as prescribed unless otherwise directed.  If you need a refill on your pain medication, please contact the office during regular business hours.  Prescriptions will not be processed by the office after 5:00PM or on weekends.  Start with a light diet upon arrival home, such as soup and crackers or toast.  Be sure to drink plenty of fluids.  Resume your normal diet the day after surgery.  Most patients will experience some swelling and bruising on the chest and neck area.  Ice packs will help for the first 48 hours after arriving home.  Swelling and bruising will take several days to resolve.   It is common to experience some constipation after surgery.  Increasing fluid intake and taking a stool softener (Colace) will usually help to prevent this problem.  A mild laxative (Milk of Magnesia or Miralax) should be taken according to package directions if there has been no bowel movement after 48 hours.  Dermabond glue covers your incision. This seals the wound and you may shower at any time. The Dermabond will remain in place for about a week.  You may gradually remove the glue when it loosens around the edges.  If you need to loosen the Dermabond for removal, apply a layer of Vaseline to the wound for 15 minutes and then remove with a Kleenex. Your sutures are under the skin and will not show - they will dissolve on their own.  You may resume light daily activities beginning the day after discharge (such as Dittman-care,  walking, climbing stairs), gradually increasing activities as tolerated. You may have sexual intercourse when it is comfortable. Refrain from any heavy lifting or straining until approved by your doctor. You may drive when you no longer are taking prescription pain medication, you can comfortably wear a seatbelt, and you can safely maneuver your car and apply the brakes.  You will see your doctor in the office for a follow-up appointment approximately three weeks after your surgery.  Make sure that you call for this appointment within a day or two after you arrive home to insure a convenient appointment time. Please have any requested laboratory tests performed a few days prior to your office visit so that the results will be available at your follow up appointment.  WHEN TO CALL THE [facility] OFFICE: -- Fever greater than 101.5 -- Inability to urinate -- Nausea and/or vomiting - persistent -- Extreme swelling or bruising -- Continued bleeding from incision -- Increased pain, redness, or drainage from the incision -- Difficulty swallowing or breathing -- Muscle cramping or spasms -- Numbness or tingling in hands or around lips  The clinic staff is available to answer your questions during regular business hours.  Please don't hesitate to call and ask to speak to one of the nurses if you have concerns.  [facility] OFFICE: [phone] (24 hours)  Please sign up for MyChart accounts. This will allow you to communicate directly with my nurse or myself without having to call the office. It will also allow you  to view your test results. You will need to enroll in MyChart for my office ([facility]) and for the hospital [facility]).  Darnell Level, MD [facility] A [facility] practice

## 2023-12-23 NOTE — Discharge Summary (Signed)
 Physician Discharge Summary   Patient ID: Anna Marquez MRN: [id number] DOB/AGE: 1950-10-23 73 y.o.  Admit date: 12/22/2023  Discharge date: 12/23/2023  Discharge Diagnoses:  Principal Problem:   Neoplasm of uncertain behavior of thyroid  gland Active Problems:   Multiple thyroid  nodules   Discharged Condition: good  Hospital Course: Patient was admitted for observation following thyroid  surgery.  Post op course was uncomplicated.  Pain was well controlled.  Tolerated diet.  Post op calcium  level on morning following surgery was 8.8 mg/dl.  Patient was prepared for discharge home on POD#1.  Consults: None  Treatments: surgery: total thyroidectomy  Discharge Exam: Blood pressure 110/79, pulse 91, temperature 97.8 F (36.6 C), temperature source Oral, resp. rate 16, height 5' 4 (1.626 m), weight 80.7 kg, SpO2 92%. HEENT - clear Neck - wound dry and intact; mild STS and ecchymosis; voice raspy; no stridor; Dermabond in place  Disposition: Home  Discharge Instructions     Increase activity slowly   Complete by: As directed    No dressing needed   Complete by: As directed       Allergies as of 12/23/2023       Reactions   Erythromycin     Upset stomach   Nsaids Other (See Comments)   Has a history of bleeding ulcers, tolerates aspirin     Penicillins Hives, Shortness Of Breath   Did it involve swelling of the face/tongue/throat, SOB, or low BP? Yes Did it involve sudden or severe rash/hives, skin peeling, or any reaction on the inside of your mouth or nose? No Did you need to seek medical attention at a hospital or doctor's office? No When did it last happen?      45 years If all above answers are NO, may proceed with cephalosporin use.        Medication List     TAKE these medications    acetaminophen  500 MG tablet Commonly known as: TYLENOL  Take 1,000 mg by mouth every 6 (six) hours as needed for moderate pain or headache.   albuterol  108 (90 Base)  MCG/ACT inhaler Commonly known as: VENTOLIN  HFA Inhale 1-2 puffs into the lungs every 6 (six) hours as needed for wheezing or shortness of breath.   ALPRAZolam  0.5 MG tablet Commonly known as: XANAX  TAKE 1 TO 2 TABLETS BY MOUTH 2 TIMES DAILY AS NEEDED FOR ANXIETY OR SLEEP. What changed:  how much to take how to take this when to take this additional instructions   calcium  carbonate 500 MG chewable tablet Commonly known as: Tums Chew 2 tablets (400 mg of elemental calcium  total) by mouth 2 (two) times daily.   Eliquis  5 MG Tabs tablet Generic drug: apixaban  TAKE 1 TABLET BY MOUTH 2 TIMES DAILY.   Farxiga  10 MG Tabs tablet Generic drug: dapagliflozin  propanediol TAKE 1 TABLET (10 MG TOTAL) BY MOUTH DAILY.   ferrous sulfate 325 (65 FE) MG tablet Take 325 mg by mouth every other day. In the morning   fluticasone  50 MCG/ACT nasal spray Commonly known as: FLONASE  PLACE 1 SPRAY INTO BOTH NOSTRILS AS NEEDED FOR ALLERGIES OR RHINITIS.   furosemide  20 MG tablet Commonly known as: LASIX  TAKE 1 TABLET (20 MG TOTAL) BY MOUTH AS NEEDED FOR EDEMA OR FLUID.   levothyroxine  100 MCG tablet Commonly known as: Synthroid  Take 1 tablet (100 mcg total) by mouth daily before breakfast.   loratadine  10 MG tablet Commonly known as: CLARITIN  Take 10 mg by mouth daily as needed for allergies.  metoprolol  succinate 100 MG 24 hr tablet Commonly known as: TOPROL -XL TAKE 1 TABLET (100 MG TOTAL) BY MOUTH DAILY. TAKE WITH OR IMMEDIATELY FOLLOWING A MEAL. What changed: when to take this   oxyCODONE  5 MG immediate release tablet Commonly known as: Oxy IR/ROXICODONE  Take 1-2 tablets (5-10 mg total) by mouth every 6 (six) hours as needed for moderate pain (pain score 4-6).   pantoprazole  40 MG tablet Commonly known as: PROTONIX  TAKE 1 TABLET (40 MG TOTAL) BY MOUTH DAILY.   potassium chloride  10 MEQ tablet Commonly known as: KLOR-CON  TAKE 1 TABLET BY MOUTH DAILY AS NEEDED (TAKE WITH HCTZ). What  changed: See the new instructions.   PROBIOTIC PO Take 1 capsule by mouth in the morning.   rosuvastatin  10 MG tablet Commonly known as: CRESTOR  TAKE 1 TABLET (10 MG TOTAL) BY MOUTH DAILY.   spironolactone  25 MG tablet Commonly known as: ALDACTONE  TAKE 1/2 TABLET (12.5 MG TOTAL) BY MOUTH DAILY. What changed: when to take this   Systane 0.4-0.3 % Soln Generic drug: Polyethyl Glycol-Propyl Glycol Place 1-2 drops into both eyes 3 (three) times daily as needed (dry/irritated eyes).   traMADol  50 MG tablet Commonly known as: ULTRAM  Take 1 tablet (50 mg total) by mouth every 8 (eight) hours as needed.   traZODone  50 MG tablet Commonly known as: DESYREL  Take 1-2 tablets (50-100 mg total) by mouth at bedtime as needed for sleep.   valACYclovir 500 MG tablet Commonly known as: VALTREX Take 500 mg by mouth in the morning.   vitamin C 1000 MG tablet Take 1,000 mg by mouth in the morning and at bedtime.   Vitamin D  50 MCG (2000 UT) tablet Take 2,000 Units by mouth daily. What changed: when to take this   vitamin E 180 MG (400 UNITS) capsule Take 400 Units by mouth in the morning.   Voltaren 1 % Gel Generic drug: diclofenac Sodium Apply 1-2 Applications topically 4 (four) times daily as needed (pain.).   ZINC CITRATE PO Take 22 mg by mouth in the morning.               Discharge Care Instructions  (From admission, onward)           Start     Ordered   12/23/23 0000  No dressing needed        12/23/23 9143            Follow-up Information     Eletha Boas, MD. Schedule an appointment as soon as possible for a visit in 3 week(s).   Specialty: General Surgery Why: For wound re-check Contact information: [address] [place] [address] [phone]                 Boas Eletha, MD [facility] Office: [phone]   Signed: Boas Eletha 12/23/2023, 8:56 AM

## 2023-12-23 NOTE — Plan of Care (Signed)

## 2023-12-23 NOTE — Plan of Care (Signed)
°  Problem: Clinical Measurements: Goal: Respiratory complications will improve Outcome: Not Progressing   Problem: Clinical Measurements: Goal: Will remain free from infection Outcome: Not Progressing   Problem: Pain Managment: Goal: General experience of comfort will improve and/or be controlled Outcome: Not Progressing   Problem: Skin Integrity: Goal: Risk for impaired skin integrity will decrease Outcome: Not Progressing

## 2023-12-24 LAB — SURGICAL PATHOLOGY

## 2023-12-25 ENCOUNTER — Ambulatory Visit: Payer: Self-pay | Admitting: Surgery

## 2023-12-25 NOTE — Progress Notes (Signed)
 Telephone call to patient with pathology results.  Multifocal papillary carcinoma, negative margins.  Voice is raspy after surgery - not unexpected given her anatomy at the time of surgery.  Plan to follow up in office 01/07/24.  Will forward pathology results to primary care and endocrinology.  Krystal Spinner, MD [facility] A [facility] practice Office: [phone]

## 2024-01-30 ENCOUNTER — Ambulatory Visit (HOSPITAL_COMMUNITY)
Admission: RE | Admit: 2024-01-30 | Discharge: 2024-01-30 | Disposition: A | Source: Ambulatory Visit | Attending: Cardiology | Admitting: Cardiology

## 2024-01-30 VITALS — BP 108/70 | HR 97 | Wt 174.2 lb

## 2024-01-30 DIAGNOSIS — Z79899 Other long term (current) drug therapy: Secondary | ICD-10-CM | POA: Insufficient documentation

## 2024-01-30 DIAGNOSIS — Z7901 Long term (current) use of anticoagulants: Secondary | ICD-10-CM | POA: Diagnosis not present

## 2024-01-30 DIAGNOSIS — I5032 Chronic diastolic (congestive) heart failure: Secondary | ICD-10-CM | POA: Insufficient documentation

## 2024-01-30 DIAGNOSIS — I340 Nonrheumatic mitral (valve) insufficiency: Secondary | ICD-10-CM | POA: Insufficient documentation

## 2024-01-30 DIAGNOSIS — I480 Paroxysmal atrial fibrillation: Secondary | ICD-10-CM | POA: Insufficient documentation

## 2024-01-30 DIAGNOSIS — I2510 Atherosclerotic heart disease of native coronary artery without angina pectoris: Secondary | ICD-10-CM | POA: Diagnosis not present

## 2024-01-30 DIAGNOSIS — Z85850 Personal history of malignant neoplasm of thyroid: Secondary | ICD-10-CM | POA: Insufficient documentation

## 2024-01-30 DIAGNOSIS — Z7984 Long term (current) use of oral hypoglycemic drugs: Secondary | ICD-10-CM | POA: Insufficient documentation

## 2024-01-30 DIAGNOSIS — E0590 Thyrotoxicosis, unspecified without thyrotoxic crisis or storm: Secondary | ICD-10-CM | POA: Diagnosis present

## 2024-01-30 DIAGNOSIS — E890 Postprocedural hypothyroidism: Secondary | ICD-10-CM | POA: Insufficient documentation

## 2024-01-30 NOTE — Patient Instructions (Signed)
 Good to see you today!  Your physician recommends that you schedule a follow-up appointment in: 6 months(July) Call [place] office in May to schedule an appointment  If you have any questions or concerns before your next appointment please send us  a message through [facility] or call our office at [phone].    TO LEAVE A MESSAGE FOR THE NURSE SELECT OPTION 2, PLEASE LEAVE A MESSAGE INCLUDING: YOUR NAME DATE OF BIRTH CALL BACK NUMBER REASON FOR CALL**this is important as we prioritize the call backs  YOU WILL RECEIVE A CALL BACK THE SAME DAY AS LONG AS YOU CALL BEFORE 4:00 PM At the [facility], you and your health needs are our priority. As part of our continuing mission to provide you with exceptional heart care, we have created designated Provider Care Teams. These Care Teams include your primary Cardiologist (physician) and Advanced Practice Providers (APPs- Physician Assistants and Nurse Practitioners) who all work together to provide you with the care you need, when you need it.   You may see any of the following providers on your designated Care Team at your next follow up: Dr Toribio Fuel Dr Ezra Shuck Dr. Morene Brownie Greig Mosses, NP Caffie Shed, [place] [facility] [place], [place] Beckey Coe, NP Jordan Lee, NP Ellouise Class, NP Tinnie Redman, PharmD Jaun Bash, PharmD   Please be sure to bring in all your medications bottles to every appointment.    Thank you for choosing [facility]

## 2024-01-31 NOTE — Progress Notes (Signed)
 "  ADVANCED HF CLINIC NOTE  Primary Care: Alvia Bring, DO HF Cardiologist: Dr. Rolan  Chief complaint: H/o atrial fibrillation  HPI: Anna Marquez is a 74 y.o.with history of diastolic CHF, paroxysmal atrial fibrillation, and hyperthyroidism.  She was was admitted with atrial fibrillation/RVR and acute on chronic diastolic CHF. Patient was on Tikosyn  several years ago for AF then had ablation in 2/21 by Dr. Kelsie.  Tikosyn  was later stopped.  Recently, she has re-developed episodes of atrial fibrillation.  She also, of noted, has been found to have hyperthyroidism likely related to multinodular goiter.  She was admitted in 5/23 with acute respiratory failure in setting of atrial fibrillation and CHF, there was some question of PNA.  She spontaneously converted to NSR.  At office visit subsequently on 06/19/21, she remained in NSR.     She was admitted  07/03/21 with atrial fibrillation with RVR and developed progressive respiratory distress. Amiodarone  gtt was started and she was given Lasix , but ultimately required intubation. She developed hypotension and required NE + vasopressin  with elevated lactate, and AKI. TEE showed normal LV size with EF 60-65%, mild RV dysfunction, severe LAE with moderate-severe central MR (likely atrial functional MR), no LA appendage thrombus. DCCV was attempted x 2, both times she went back into NSR but promptly reverted to AF. Eventual successful DCCV to NSR on 07/09/21. Drips weaned off, and started on oral amiodarone  taper. GDMT started and she was discharged home, weight 162 lbs.  In 11/23, she had atrial fibrillation ablation.  Amiodarone  was stopped in 2/24.   Echo in 3/24 showed EF 55-60%, normal RV, mild MR.  Echo in 4/25 showed EF 55-60%, normal RV, mild MR, IVC normal.   In 12/25, she had a thyroidectomy.  Papillary thyroid  carcinoma was identified.  Today she returns for HF follow up. She is still hoarse after her thyroid  surgery.  Rarely using Lasix .   No exertional dyspnea or chest pain.  She is in NSR, no palpitations.  Since last visit, she thinks she has been in AF once for about 5 minutes.  No orthopnea/PND.    ECG (personally reviewed): NSR, nonspecific T wave flattening  Labs (3/24): LDL 44 Labs (8/24): hgb 13, K 3.6, creatinine 1.3 Labs (4/25): K 3.6, creatinine 0.96 Labs (9/25): LDL 24 Labs (1/26): K 3.7, creatinine 1.14, TSH low, free T4 elevated.  PMH: 1. Cholelithiasis 2. Diverticulosis 3. H/o COVID-19 4. Hyperthyroidism: Amiodarone  + multinodular goiter - Thyroidectomy 12/25 showed papillary thyroid  carcinoma 5. Atrial fibrillation: Paroxysmal. - Atrial fibrillation ablation 2/21.  - Atrial fibrillation ablation in 11/23.  6. Chronic diastolic CHF:  TEE (6/23) with normal LV size with EF 60-65%, mild RV dysfunction, severe LAE with moderate-severe central MR (likely atrial functional MR), no LA appendage thrombus. - Echo (3/24): EF 55-60%, normal RV, mild MR.  - Echo (4/25): EF 55-60%, normal RV, mild MR, IVC normal. 7. CAD: CTA for pulmonary veins showed calcium  score 134, 72nd percentile.  8. Acanthamoeba keratitis   Current Outpatient Medications  Medication Sig Dispense Refill   acetaminophen  (TYLENOL ) 500 MG tablet Take 1,000 mg by mouth every 6 (six) hours as needed for moderate pain or headache.     albuterol  (VENTOLIN  HFA) 108 (90 Base) MCG/ACT inhaler Inhale 1-2 puffs into the lungs every 6 (six) hours as needed for wheezing or shortness of breath. 18 g 4   ALPRAZolam  (XANAX ) 0.5 MG tablet TAKE 1 TO 2 TABLETS BY MOUTH 2 TIMES DAILY AS NEEDED FOR ANXIETY OR  SLEEP. 30 tablet 2   Ascorbic Acid (VITAMIN C) 1000 MG tablet Take 1,000 mg by mouth in the morning and at bedtime.     Cholecalciferol  (VITAMIN D ) 50 MCG (2000 UT) tablet Take 2,000 Units by mouth daily.     diclofenac Sodium (VOLTAREN) 1 % GEL Apply 1-2 Applications topically 4 (four) times daily as needed (pain.).     ELIQUIS  5 MG TABS tablet TAKE 1  TABLET BY MOUTH 2 TIMES DAILY. 180 tablet 3   FARXIGA  10 MG TABS tablet TAKE 1 TABLET (10 MG TOTAL) BY MOUTH DAILY. 30 tablet 5   ferrous sulfate 325 (65 FE) MG tablet Take 325 mg by mouth every other day. In the morning     fluticasone  (FLONASE ) 50 MCG/ACT nasal spray PLACE 1 SPRAY INTO BOTH NOSTRILS AS NEEDED FOR ALLERGIES OR RHINITIS. 16 g 7   furosemide  (LASIX ) 20 MG tablet TAKE 1 TABLET (20 MG TOTAL) BY MOUTH AS NEEDED FOR EDEMA OR FLUID. 90 tablet 4   levothyroxine  (SYNTHROID ) 125 MCG tablet Take 125 mcg by mouth daily before breakfast.     loratadine  (CLARITIN ) 10 MG tablet Take 10 mg by mouth daily as needed for allergies.     metoprolol  succinate (TOPROL -XL) 100 MG 24 hr tablet TAKE 1 TABLET (100 MG TOTAL) BY MOUTH DAILY. TAKE WITH OR IMMEDIATELY FOLLOWING A MEAL. (Patient taking differently: Take 100 mg by mouth at bedtime. Take with or immediately following a meal.) 90 tablet 3   oxyCODONE  (OXY IR/ROXICODONE ) 5 MG immediate release tablet Take 1-2 tablets (5-10 mg total) by mouth every 6 (six) hours as needed for moderate pain (pain score 4-6). 20 tablet 0   pantoprazole  (PROTONIX ) 40 MG tablet TAKE 1 TABLET (40 MG TOTAL) BY MOUTH DAILY. 90 tablet 5   Polyethyl Glycol-Propyl Glycol (SYSTANE) 0.4-0.3 % SOLN Place 1-2 drops into both eyes 3 (three) times daily as needed (dry/irritated eyes).     potassium chloride  (KLOR-CON ) 10 MEQ tablet TAKE 1 TABLET BY MOUTH DAILY AS NEEDED (TAKE WITH HCTZ). 90 tablet 3   Probiotic Product (PROBIOTIC PO) Take 1 capsule by mouth in the morning.     rosuvastatin  (CRESTOR ) 10 MG tablet TAKE 1 TABLET (10 MG TOTAL) BY MOUTH DAILY. 90 tablet 1   spironolactone  (ALDACTONE ) 25 MG tablet TAKE 1/2 TABLET (12.5 MG TOTAL) BY MOUTH DAILY. 15 tablet 11   traMADol  (ULTRAM ) 50 MG tablet Take 1 tablet (50 mg total) by mouth every 8 (eight) hours as needed. 30 tablet 1   traZODone  (DESYREL ) 50 MG tablet Take 1-2 tablets (50-100 mg total) by mouth at bedtime as needed for  sleep. 30 tablet 3   valACYclovir (VALTREX) 500 MG tablet Take 500 mg by mouth in the morning.     vitamin E 180 MG (400 UNITS) capsule Take 400 Units by mouth in the morning.     ZINC CITRATE PO Take 22 mg by mouth in the morning.     No current facility-administered medications for this encounter.   Allergies  Allergen Reactions   Erythromycin      Upset stomach   Nsaids Other (See Comments)    Has a history of bleeding ulcers, tolerates aspirin     Penicillins Hives and Shortness Of Breath    Did it involve swelling of the face/tongue/throat, SOB, or low BP? Yes Did it involve sudden or severe rash/hives, skin peeling, or any reaction on the inside of your mouth or nose? No Did you need to seek medical attention  at a hospital or doctor's office? No When did it last happen?      45 years If all above answers are NO, may proceed with cephalosporin use.    Social History   Socioeconomic History   Marital status: Divorced    Spouse name: Not on file   Number of children: 2   Years of education: 12th grade   Highest education level: 12th grade  Occupational History   Occupation: Retired,  Tobacco Use   Smoking status: Never   Smokeless tobacco: Never   Tobacco comments:    Never smoke 07/16/21  Vaping Use   Vaping status: Never Used  Substance and Sexual Activity   Alcohol use: Never    Alcohol/week: 0.0 standard drinks of alcohol   Drug use: No   Sexual activity: Yes    Birth control/protection: Post-menopausal    Comment: 1st intercourse 74 yo-Fewer than 5 partners  Other Topics Concern   Not on file  Social History Narrative   Lives along with her dog. She enjoys croteching, crafts, making wreaths, reading and gardening in her free time.    Social Drivers of Health   Tobacco Use: Low Risk  (01/07/2024)   Received from [facility]   Patient History    Smoking Tobacco Use: Never    Smokeless Tobacco Use: Never    Passive Exposure: Past   Financial Resource Strain: Low Risk (11/24/2023)   Overall Financial Resource Strain (CARDIA)    Difficulty of Paying Living Expenses: Not very hard  Food Insecurity: No Food Insecurity (12/22/2023)   Epic    Worried About Radiation Protection Practitioner of Food in the Last Year: Never true    Ran Out of Food in the Last Year: Never true  Transportation Needs: No Transportation Needs (12/22/2023)   Epic    Lack of Transportation (Medical): No    Lack of Transportation (Non-Medical): No  Physical Activity: Insufficiently Active (11/24/2023)   Exercise Vital Sign    Days of Exercise per Week: 3 days    Minutes of Exercise per Session: 20 min  Stress: Stress Concern Present (11/24/2023)   [employer] of Occupational Health - Occupational Stress Questionnaire    Feeling of Stress: To some extent  Social Connections: Unknown (12/22/2023)   Social Connection and Isolation Panel    Frequency of Communication with Friends and Family: More than three times a week    Frequency of Social Gatherings with Friends and Family: More than three times a week    Attends Religious Services: Patient declined    Active Member of Clubs or Organizations: Patient declined    Attends Banker Meetings: Patient declined    Marital Status: Divorced  Catering Manager Violence: Not At Risk (12/22/2023)   Epic    Fear of Current or Ex-Partner: No    Emotionally Abused: No    Physically Abused: No    Sexually Abused: No  Depression (PHQ2-9): Low Risk (07/28/2023)   Depression (PHQ2-9)    PHQ-2 Score: 2  Alcohol Screen: Low Risk (07/28/2023)   Alcohol Screen    Last Alcohol Screening Score (AUDIT): 0  Housing: Low Risk (12/22/2023)   Epic    Unable to Pay for Housing in the Last Year: No    Number of Times Moved in the Last Year: 0    Homeless in the Last Year: No  Utilities: Not At Risk (12/22/2023)   Epic    Threatened with loss of utilities: No  Health Literacy:  Adequate Health Literacy (07/28/2023)    B1300 Health Literacy    Frequency of need for help with medical instructions: Never   Family History  Problem Relation Age of Onset   Cancer Father        prostate   Alcohol abuse Father    Hearing loss Father    Breast cancer Paternal Aunt 7   Diabetes Maternal Grandmother    Heart disease Maternal Grandmother    Hypertension Maternal Grandmother    Heart disease Maternal Grandfather    Melanoma Paternal Aunt    Neuropathy Mother    Congestive Heart Failure Mother    COPD Mother    Hearing loss Mother    Vision loss Mother    Arthritis Paternal Grandmother    Thyroid  disease Daughter    Heart disease Maternal Uncle    Kidney disease Brother    BP 108/70   Pulse 97   Wt 79 kg (174 lb 3.2 oz)   SpO2 96%   BMI 29.90 kg/m   Wt Readings from Last 3 Encounters:  01/30/24 79 kg (174 lb 3.2 oz)  12/22/23 80.7 kg (178 lb)  12/15/23 80.7 kg (178 lb)   PHYSICAL EXAM: General: NAD Neck: No JVD, no thyromegaly or thyroid  nodule.  Lungs: Clear to auscultation bilaterally with normal respiratory effort. CV: Nondisplaced PMI.  Heart regular S1/S2, no S3/S4, no murmur.  No peripheral edema.  No carotid bruit.  Normal pedal pulses.  Abdomen: Soft, nontender, no hepatosplenomegaly, no distention.  Skin: Intact without lesions or rashes.  Neurologic: Alert and oriented x 3.  Psych: Normal affect. Extremities: No clubbing or cyanosis.  HEENT: Normal.   ASSESSMENT & PLAN: 1. Chronic diastolic CHF: TEE in 6/23 showed normal LV size with EF 60-65%, mild RV dysfunction, severe LAE with moderate-severe central MR (likely atrial functional MR). Suspect CHF exacerbation in 6/23 was triggered by atrial fibrillation, also mitral regurgitation may have been worsened by atrial fibrillation.  She had a prior admission with CHF in the setting of atrial fibrillation, consistent with flash pulmonary edema. Echo in 3/24 showed  EF 55-60%, normal RV, mild MR.  Echo in 4/25 also showed EF 55-60%,  normal RV, mild MR, IVC normal. She is not volume overloaded on exam, NYHA class I. Need to keep her in NSR as AF appears to precipitate CHF.  - Continue spironolactone  12.5 mg daily. Recent BMET stable.  - Continue Toprol  XL 100 mg daily.  - Continue Farxiga  10 mg daily.  - She has not needed Lasix  recently.  - Ischemia as a contributor to flash pulmonary edema is possible though less likely. She has not been cathed.  2. Atrial fibrillation: Persistent.  She was on Tikosyn  in the past then had AF ablation in 2/21, Tikosyn  stopped after this.  She was not on an antiarrhythmic prior to admission.  She was admitted in 5/23 with AF and CHF exacerbation. She has also been noted to have developed hyperthyroidism prior to this admission, this can help trigger AF. Need to keep in NSR due to worsening of HF in AF.  DCCV 07/05/21 failed, but she was still on low dose norepinephrine . DCCV 07/09/21 successful. Amiodarone  is not an ideal medication for her (h/o hyperthyroidism). Would like to avoid this long-term. She had re-do atrial fibrillation ablation with Dr. Inocencio 11/23/21. She is in NSR today and amiodarone  has been stopped. - Continue Eliquis  5 mg bid.  3. Hyperthyroidism => hypothyroidism: She had this prior to 5/23 admission (prior  to amiodarone  use).  Has multinodular goiter. She sees endocrinology. Now off amiodarone  and methimazole . She has had thyroidectomy and is now hypothyroid and on Levoxyl .  - TSH was low recently, needs to contact her endocrinologist to decrease Levoxyl .  4. CAD: borderline calcium  score, 72nd percentile.  - Continue Crestor , good lipids in 9/25.   Follow up 6 months at [facility] HF clinic  I spent 22 minutes reviewing records, interviewing/examining patient, and managing orders.   Anna Marquez  01/31/2024 "

## 2024-03-25 ENCOUNTER — Ambulatory Visit: Admitting: Family Medicine

## 2024-04-14 ENCOUNTER — Ambulatory Visit

## 2024-04-22 ENCOUNTER — Ambulatory Visit: Admitting: Dermatology
